# Patient Record
Sex: Male | Born: 1965 | Race: White | Hispanic: No | Marital: Married | Smoking: Former smoker
Health system: Southern US, Community
[De-identification: ages and names within clinical notes are randomized; demographics above are authoritative.]

## PROBLEM LIST (undated history)

## (undated) DIAGNOSIS — R519 Headache, unspecified: Secondary | ICD-10-CM

## (undated) DIAGNOSIS — F419 Anxiety disorder, unspecified: Secondary | ICD-10-CM

## (undated) DIAGNOSIS — D126 Benign neoplasm of colon, unspecified: Secondary | ICD-10-CM

## (undated) DIAGNOSIS — R51 Headache: Secondary | ICD-10-CM

## (undated) DIAGNOSIS — J189 Pneumonia, unspecified organism: Secondary | ICD-10-CM

## (undated) DIAGNOSIS — C3492 Malignant neoplasm of unspecified part of left bronchus or lung: Secondary | ICD-10-CM

## (undated) DIAGNOSIS — K219 Gastro-esophageal reflux disease without esophagitis: Secondary | ICD-10-CM

## (undated) DIAGNOSIS — I10 Essential (primary) hypertension: Secondary | ICD-10-CM

## (undated) DIAGNOSIS — J449 Chronic obstructive pulmonary disease, unspecified: Secondary | ICD-10-CM

## (undated) DIAGNOSIS — F4321 Adjustment disorder with depressed mood: Secondary | ICD-10-CM

## (undated) HISTORY — DX: Adjustment disorder with depressed mood: F43.21

## (undated) HISTORY — DX: Gastro-esophageal reflux disease without esophagitis: K21.9

## (undated) HISTORY — DX: Chronic obstructive pulmonary disease, unspecified: J44.9

## (undated) HISTORY — DX: Malignant neoplasm of unspecified part of left bronchus or lung: C34.92

## (undated) HISTORY — PX: KNEE ARTHROSCOPY: SUR90

## (undated) HISTORY — PX: COLONOSCOPY: SHX174

---

## 2002-04-26 ENCOUNTER — Encounter: Admission: RE | Admit: 2002-04-26 | Discharge: 2002-04-26 | Payer: Self-pay | Admitting: Internal Medicine

## 2002-04-26 ENCOUNTER — Encounter: Payer: Self-pay | Admitting: Internal Medicine

## 2003-05-05 ENCOUNTER — Encounter: Payer: Self-pay | Admitting: Orthopedic Surgery

## 2003-05-05 ENCOUNTER — Ambulatory Visit (HOSPITAL_COMMUNITY): Admission: RE | Admit: 2003-05-05 | Discharge: 2003-05-05 | Payer: Self-pay | Admitting: Orthopedic Surgery

## 2007-03-26 ENCOUNTER — Ambulatory Visit: Payer: Self-pay | Admitting: Internal Medicine

## 2007-03-29 LAB — CONVERTED CEMR LAB
ALT: 24 units/L (ref 0–40)
AST: 22 units/L (ref 0–37)
Albumin: 4.1 g/dL (ref 3.5–5.2)
Alkaline Phosphatase: 67 units/L (ref 39–117)
BUN: 7 mg/dL (ref 6–23)
Basophils Absolute: 0 10*3/uL (ref 0.0–0.1)
Basophils Relative: 0 % (ref 0.0–1.0)
Bilirubin Urine: NEGATIVE
Bilirubin, Direct: 0.2 mg/dL (ref 0.0–0.3)
CO2: 31 meq/L (ref 19–32)
Calcium: 9.2 mg/dL (ref 8.4–10.5)
Chloride: 105 meq/L (ref 96–112)
Cholesterol: 139 mg/dL (ref 0–200)
Creatinine, Ser: 0.9 mg/dL (ref 0.4–1.5)
Eosinophils Absolute: 0.1 10*3/uL (ref 0.0–0.6)
Eosinophils Relative: 1.2 % (ref 0.0–5.0)
GFR calc Af Amer: 119 mL/min
GFR calc non Af Amer: 98 mL/min
Glucose, Bld: 88 mg/dL (ref 70–99)
HCT: 47.1 % (ref 39.0–52.0)
HDL: 41.9 mg/dL (ref >39.0)
Hemoglobin, Urine: NEGATIVE
Hemoglobin: 16.3 g/dL (ref 13.0–17.0)
Ketones, ur: NEGATIVE mg/dL
LDL Cholesterol: 85 mg/dL (ref 0–99)
Leukocytes, UA: NEGATIVE
Lymphocytes Relative: 28.3 % (ref 12.0–46.0)
MCHC: 34.6 g/dL (ref 30.0–36.0)
MCV: 91.8 fL (ref 78.0–100.0)
Monocytes Absolute: 0.5 10*3/uL (ref 0.2–0.7)
Monocytes Relative: 7.9 % (ref 3.0–11.0)
Neutro Abs: 4.3 10*3/uL (ref 1.4–7.7)
Neutrophils Relative %: 62.6 % (ref 43.0–77.0)
Nitrite: NEGATIVE
PSA: 1.42 ng/mL (ref 0.10–4.00)
Platelets: 229 10*3/uL (ref 150–400)
Potassium: 5.8 meq/L — ABNORMAL HIGH (ref 3.5–5.1)
RBC: 5.13 M/uL (ref 4.22–5.81)
RDW: 11.5 % (ref 11.5–14.6)
Sodium: 141 meq/L (ref 135–145)
Specific Gravity, Urine: <=1.005 (ref 1.000–1.03)
TSH: 1.61 microintl units/mL (ref 0.35–5.50)
Total Bilirubin: 0.6 mg/dL (ref 0.3–1.2)
Total CHOL/HDL Ratio: 3.3
Total Protein, Urine: NEGATIVE mg/dL
Total Protein: 6.8 g/dL (ref 6.0–8.3)
Triglycerides: 61 mg/dL (ref 0–149)
Urine Glucose: NEGATIVE mg/dL
Urobilinogen, UA: 0.2 (ref 0.0–1.0)
VLDL: 12 mg/dL (ref 0–40)
WBC: 6.9 10*3/uL (ref 4.5–10.5)
pH: 6 (ref 5.0–8.0)

## 2010-07-10 ENCOUNTER — Ambulatory Visit: Payer: Self-pay | Admitting: Internal Medicine

## 2010-07-10 DIAGNOSIS — R059 Cough, unspecified: Secondary | ICD-10-CM | POA: Insufficient documentation

## 2010-07-10 DIAGNOSIS — K219 Gastro-esophageal reflux disease without esophagitis: Secondary | ICD-10-CM | POA: Insufficient documentation

## 2010-07-10 DIAGNOSIS — F17200 Nicotine dependence, unspecified, uncomplicated: Secondary | ICD-10-CM

## 2010-07-10 DIAGNOSIS — R05 Cough: Secondary | ICD-10-CM

## 2010-07-10 DIAGNOSIS — J449 Chronic obstructive pulmonary disease, unspecified: Secondary | ICD-10-CM

## 2010-08-19 ENCOUNTER — Ambulatory Visit: Payer: Self-pay | Admitting: Internal Medicine

## 2010-08-19 LAB — CONVERTED CEMR LAB
ALT: 23 units/L (ref 0–53)
AST: 24 units/L (ref 0–37)
Albumin: 4.1 g/dL (ref 3.5–5.2)
Alkaline Phosphatase: 72 units/L (ref 39–117)
BUN: 9 mg/dL (ref 6–23)
Basophils Absolute: 0 10*3/uL (ref 0.0–0.1)
Basophils Relative: 0.3 % (ref 0.0–3.0)
Bilirubin Urine: NEGATIVE
Bilirubin, Direct: 0.1 mg/dL (ref 0.0–0.3)
CO2: 31 meq/L (ref 19–32)
Calcium: 9.8 mg/dL (ref 8.4–10.5)
Chloride: 106 meq/L (ref 96–112)
Cholesterol: 174 mg/dL (ref 0–200)
Creatinine, Ser: 0.8 mg/dL (ref 0.4–1.5)
Direct LDL: 91.6 mg/dL
Eosinophils Absolute: 0 10*3/uL (ref 0.0–0.7)
Eosinophils Relative: 0.7 % (ref 0.0–5.0)
GFR calc non Af Amer: 107.56 mL/min (ref >60)
Glucose, Bld: 103 mg/dL — ABNORMAL HIGH (ref 70–99)
H Pylori IgG: NEGATIVE
HCT: 48.3 % (ref 39.0–52.0)
HDL: 43.6 mg/dL (ref >39.00)
Hemoglobin, Urine: NEGATIVE
Hemoglobin: 17 g/dL (ref 13.0–17.0)
Ketones, ur: NEGATIVE mg/dL
Leukocytes, UA: NEGATIVE
Lymphocytes Relative: 22.3 % (ref 12.0–46.0)
Lymphs Abs: 1.5 10*3/uL (ref 0.7–4.0)
MCHC: 35.2 g/dL (ref 30.0–36.0)
MCV: 96.2 fL (ref 78.0–100.0)
Monocytes Absolute: 0.5 10*3/uL (ref 0.1–1.0)
Monocytes Relative: 8.2 % (ref 3.0–12.0)
Neutro Abs: 4.5 10*3/uL (ref 1.4–7.7)
Neutrophils Relative %: 68.5 % (ref 43.0–77.0)
Nitrite: NEGATIVE
Platelets: 185 10*3/uL (ref 150.0–400.0)
Potassium: 5.8 meq/L — ABNORMAL HIGH (ref 3.5–5.1)
RBC: 5.02 M/uL (ref 4.22–5.81)
RDW: 12.5 % (ref 11.5–14.6)
Sodium: 144 meq/L (ref 135–145)
Specific Gravity, Urine: 1.02 (ref 1.000–1.030)
TSH: 2.32 microintl units/mL (ref 0.35–5.50)
Total Bilirubin: 0.7 mg/dL (ref 0.3–1.2)
Total CHOL/HDL Ratio: 4
Total Protein, Urine: NEGATIVE mg/dL
Total Protein: 6.9 g/dL (ref 6.0–8.3)
Triglycerides: 249 mg/dL — ABNORMAL HIGH (ref 0.0–149.0)
Urine Glucose: NEGATIVE mg/dL
Urobilinogen, UA: 0.2 (ref 0.0–1.0)
VLDL: 49.8 mg/dL — ABNORMAL HIGH (ref 0.0–40.0)
WBC: 6.6 10*3/uL (ref 4.5–10.5)
pH: 6.5 (ref 5.0–8.0)

## 2010-08-22 ENCOUNTER — Telehealth: Payer: Self-pay | Admitting: Internal Medicine

## 2010-08-23 ENCOUNTER — Encounter: Payer: Self-pay | Admitting: Internal Medicine

## 2010-08-23 ENCOUNTER — Ambulatory Visit: Payer: Self-pay | Admitting: Internal Medicine

## 2010-08-23 DIAGNOSIS — R1310 Dysphagia, unspecified: Secondary | ICD-10-CM | POA: Insufficient documentation

## 2010-10-14 ENCOUNTER — Ambulatory Visit: Payer: Self-pay | Admitting: Internal Medicine

## 2010-10-14 ENCOUNTER — Encounter (INDEPENDENT_AMBULATORY_CARE_PROVIDER_SITE_OTHER): Payer: Self-pay | Admitting: *Deleted

## 2010-11-05 ENCOUNTER — Ambulatory Visit: Payer: Self-pay | Admitting: Internal Medicine

## 2010-12-10 NOTE — Assessment & Plan Note (Signed)
Summary: DYSPHAGIA/YF   History of Present Illness Visit Type: Initial Consult Primary GI MD: Stan Head MD [facility] Primary Provider: Jacinta Shoe, MD Requesting Provider: Jacinta Shoe, MD Chief Complaint: dysphagia History of Present Illness:   Patient complains of feeling like something stuck in his throat for about six month. He states that he doesnot have any trouble swallowing just this feeling that wont go away.   6 months of globus sensation most of the time. Food and liquids go down fine usually but he has this tightness and foreign body sensation. On Nexium x 3 months without any change and no difference. No classic heartburn described before or after Nexium.  Recent bronchitis and voice is nasal. He has sinus problems and post nasal drip for at least a few months. Some of these  are seasonal.    GI Review of Systems    Reports acid reflux.      Denies abdominal pain, belching, bloating, chest pain, dysphagia with liquids, dysphagia with solids, heartburn, loss of appetite, nausea, vomiting, vomiting blood, weight loss, and  weight gain.        Denies anal fissure, black tarry stools, change in bowel habit, constipation, diarrhea, diverticulosis, fecal incontinence, heme positive stool, hemorrhoids, irritable bowel syndrome, jaundice, light color stool, liver problems, rectal bleeding, and  rectal pain. Preventive Screening-Counseling & Management  Alcohol-Tobacco     Smoking Status: current     Smoking Cessation Counseling: yes     Smoke Cessation Stage: contemplative  Comments: has reduced smoking and encouraged to quit.      Drug Use:  no.      Current Medications (verified): 1)  Vitamin D (Ergocalciferol) 50000 Unit Caps (Ergocalciferol) .Marland Kitchen.. 1 By Mouth Q 1 Week X 6 Weeks Then Start Vit D 1000 International Units Qd 2)  Nexium 40 Mg Cpdr (Esomeprazole Magnesium) .Marland Kitchen.. 1 By Mouth Qam ( Medically Necessary) 3)  Dulera 200-5 Mcg/act Aero (Mometasone  Furo-Formoterol Fum) .... 2 Inh Bid  Allergies (verified): No Known Drug Allergies  Past History:  Past Medical History: Reviewed history from 08/23/2010 and no changes required. COPD GERD  Past Surgical History: Reviewed history from 07/10/2010 and no changes required. L knee arthrosc surgery  Family History: skin ca: father breast ca: mother HTN: father No FH of Colon Cancer: Family History of Colon Polyps: father  Social History: Occupation:printing Single Current Smoker 1 ppd Alcohol use-yes social drinker Daily Caffeine Use 3 per day Illicit Drug Use - no Drug Use:  no  Review of Systems       The patient complains of cough and shortness of breath.         All other ROS negative except as per HPI.    Vital Signs:  Patient profile:   45 year old male Height:      71 inches Weight:      187.6 pounds BMI:     26.26 Pulse rate:   88 / minute Pulse rhythm:   regular BP sitting:   128 / 80  (left arm) Cuff size:   regular  Vitals Entered By: Harlow Mares CMA Duncan Dull) (October 14, 2010 10:12 AM)  Physical Exam  General:  Well developed, well nourished, no acute distress. Eyes:  PERRLA, no icterus. Mouth:  mild posterior pharynx erythema otherwise clear mouth, pharynx Neck:  Supple; no masses or thyromegaly. Lungs:  fair air movement wth inspiratory and expiratory wheezes Heart:  Regular rate and rhythm; no murmurs, rubs,  or bruits. Abdomen:  Soft, nontender and nondistended. No masses, hepatosplenomegaly or hernias noted. Normal bowel sounds. Extremities:  no edema Cervical Nodes:  No significant cervical or supraclavicular adenopathy.  Psych:  Alert and cooperative. Normal mood and affect.   Impression & Recommendations:  Problem # 1:  DYSPHAGIA UNSPECIFIED (ICD-787.20) Assessment Comment Only  NEW to ME mostly a globus problem but quite bothersome and unresponsive to Nexium 40 mg once daily since August. Possible causes seem like GERD (? if  so or needs two times a day PPI), sinus drainage, anxiety (does not seem so) with smoking hx if negative consider ENT evaluation and/or neck imaging EGD and possble dilation - Risks, benefits,and indications of endoscopic procedure(s) were reviewed with the patient and all questions answered.  Orders: EGD SAV (EGD SAV)  Patient Instructions: 1)  Please continue current medications.  2)  We will see you at your procedure on 11/05/10. 3)  [facility] Patient Information Guide given to patient.  4)  Upper Endoscopy with Dilatation brochure given.  5)  Copy sent to : Sonda Primes, MD 6)  The medication list was reviewed and reconciled.  All changed / newly prescribed medications were explained.  A complete medication list was provided to the patient / caregiver.

## 2010-12-10 NOTE — Progress Notes (Signed)
Summary: Vit D ?  Phone Note From Pharmacy   Caller: [facility] [address] [place] Summary of Call: rec rf request for Vit D 50,000IU.  Sig says take 1 by mouth weekly X 6 wks then start Vit D 1000IU by mouth once daily.  Ok to RF 50,000IU again??? Initial call taken by: Lanier Prude, [facility]),  August 22, 2010 1:36 PM  Follow-up for Phone Call        If he took the first course - no need to repeat 50000, just take 1000 u Vit D daily Follow-up by: Tresa Garter MD,  August 22, 2010 3:55 PM  Additional Follow-up for Phone Call Additional follow up Details #1::        spoke to Pt, he only received 4 wks of 50,000IU Vit D because insurance would not cover 6 weeks. Will call in 2 weeks of 50,000IU Vit D and once he has completed, to begin taking the 1000IU Vit D. Additional Follow-up by: Alysia Penna,  August 22, 2010 5:07 PM    Prescriptions: VITAMIN D (ERGOCALCIFEROL) 50000 UNIT CAPS (ERGOCALCIFEROL) 1 by mouth q 1 week x 6 weeks then start Vit D 1000 international units qd  #2 x 0   Entered by:   Alysia Penna   Authorized by:   Tresa Garter MD   Signed by:   Alysia Penna on 08/22/2010   Method used:   Electronically to        [facility] (retail)       [address]       [place], [place]  [address]       Ph: [phone]       Fax: [phone]   RxID:   [id number]

## 2010-12-10 NOTE — Assessment & Plan Note (Signed)
Summary: 6 WK ROV /NWS  #   Vital Signs:  Patient profile:   45 year old male Height:      71 inches Weight:      186 pounds BMI:     26.04 Temp:     97.7 degrees F oral Pulse rate:   84 / minute Pulse rhythm:   regular Resp:     16 per minute BP sitting:   124 / 78  (left arm) Cuff size:   regular  Vitals Entered By: Lanier Prude, CMA(AAMA) (August 23, 2010 7:52 AM) CC: 6 wk f/u Is Patient Diabetic? No   CC:  6 wk f/u.  History of Present Illness: C/o dysphagia and cough. He is much better on Dulera and Nexium, however still has some difficulty swallowing.  Preventive Screening-Counseling & Management  Alcohol-Tobacco     Smoking Status: current     Packs/Day: 1.0  Current Medications (verified): 1)  Vitamin D (Ergocalciferol) 50000 Unit Caps (Ergocalciferol) .Marland Kitchen.. 1 By Mouth Q 1 Week X 6 Weeks Then Start Vit D 1000 International Units Qd 2)  Nexium 40 Mg Cpdr (Esomeprazole Magnesium) .Marland Kitchen.. 1 By Mouth Qam ( Medically Necessary) 3)  Dulera 200-5 Mcg/act Aero (Mometasone Furo-Formoterol Fum) .... 2 Inh Bid  Allergies (verified): No Known Drug Allergies  Past History:  Social History: Last updated: 08/23/2010 Occupation:printing Single Current Smoker 1 ppd Alcohol use-yes  Past Medical History: COPD GERD  Social History: Occupation:printing Single Current Smoker 1 ppd Alcohol use-yes Packs/Day:  1.0  Review of Systems  The patient denies fever, weight loss, weight gain, chest pain, and dyspnea on exertion.    Physical Exam  General:  Well-developed,well-nourished,in no acute distress; alert,appropriate and cooperative throughout examination Nose:  External nasal examination shows no deformity or inflammation. Nasal mucosa are pink and moist without lesions or exudates. Mouth:  Oral mucosa and oropharynx without lesions or exudates.  Neck:  No deformities, masses, or tenderness noted. Lungs:  Normal respiratory effort, chest expands symmetrically.  Lungs are clear to auscultation, mild B wheezes. Heart:  Normal rate and regular rhythm. S1 and S2 normal without gallop, murmur, click, rub or other extra sounds. Abdomen:  Bowel sounds positive,abdomen soft and non-tender without masses, organomegaly or hernias noted. Msk:  No deformity or scoliosis noted of thoracic or lumbar spine.   Extremities:  No clubbing, cyanosis, edema, or deformity noted with normal full range of motion of all joints.   Neurologic:  No cranial nerve deficits noted. Station and gait are normal. Plantar reflexes are down-going bilaterally. DTRs are symmetrical throughout. Sensory, motor and coordinative functions appear intact. Skin:  Intact without suspicious lesions or rashes Psych:  Cognition and judgment appear intact. Alert and cooperative with normal attention span and concentration. No apparent delusions, illusions, hallucinations   Impression & Recommendations:  Problem # 1:  GERD (ICD-530.81) Assessment Improved  His updated medication list for this problem includes:    Nexium 40 Mg Cpdr (Esomeprazole magnesium) .Marland Kitchen... 1 by mouth qam ( medically necessary)  Orders: Gastroenterology Referral (GI)  Problem # 2:  COPD (ICD-496) Assessment: Improved  His updated medication list for this problem includes:    Dulera 200-5 Mcg/act Aero (Mometasone furo-formoterol fum) .Marland Kitchen... 2 inh bid  Orders: Spirometry w/Graph ([address])  Problem # 3:  COUGH (ICD-786.2) Assessment: Improved  Orders: Spirometry w/Graph ([address])  Problem # 4:  DYSPHAGIA UNSPECIFIED (ICD-787.20) Assessment: Improved  GI consult  Orders: Gastroenterology Referral (GI)  Problem # 5:  TOBACCO USER (ICD-305.1) Assessment:  Improved Down to 1 ppd  Complete Medication List: 1)  Vitamin D (ergocalciferol) 50000 Unit Caps (Ergocalciferol) .Marland Kitchen.. 1 by mouth q 1 week x 6 weeks then start vit d 1000 international units qd 2)  Nexium 40 Mg Cpdr (Esomeprazole magnesium) .Marland Kitchen.. 1 by mouth qam (  medically necessary) 3)  Dulera 200-5 Mcg/act Aero (Mometasone furo-formoterol fum) .... 2 inh bid  Patient Instructions: 1)  Please schedule a follow-up appointment in 4 months.

## 2010-12-10 NOTE — Assessment & Plan Note (Signed)
Summary: PER CINDY/CARDIO VASCULAR US-CHOKE WHEN EAT-LAST APPT:2008-[facility]   Vital Signs:  Patient profile:   45 year old male Height:      71 inches Weight:      185 pounds BMI:     25.90 Temp:     98.5 degrees F oral Pulse rate:   84 / minute Pulse rhythm:   regular Resp:     16 per minute BP sitting:   128 / 80  (left arm) Cuff size:   regular  Vitals Entered By: Lanier Prude, Beverly Gust) (July 10, 2010 2:04 PM) CC: choking sensation Is Patient Diabetic? No   CC:  choking sensation.  History of Present Illness: New pt -- C/o chocking sensation x 5-6 months. He states it feels funny in the throat; coughing spells, 0-2 attacks a day. No CP, SOB.  No N/V.  Preventive Screening-Counseling & Management  Alcohol-Tobacco     Smoking Status: current  Current Medications (verified): 1)  None  Allergies (verified): No Known Drug Allergies  Past History:  Past Medical History: COPD  Past Surgical History: L knee arthrosc surgery  Family History: skin ca breast ca HTN  Social History: Occupation:printing Single Current Smoker Alcohol use-yes  Review of Systems  The patient denies anorexia, fever, weight loss, weight gain, hoarseness, chest pain, dyspnea on exertion, prolonged cough, abdominal pain, melena, hematochezia, severe indigestion/heartburn, muscle weakness, depression, and enlarged lymph nodes.    Physical Exam  General:  Well-developed,well-nourished,in no acute distress; alert,appropriate and cooperative throughout examination Head:  Normocephalic and atraumatic without obvious abnormalities. No apparent alopecia or balding. Eyes:  No corneal or conjunctival inflammation noted. EOMI. Perrla. Ears:  External ear exam shows no significant lesions or deformities.  Otoscopic examination reveals clear canals, tympanic membranes are intact bilaterally without bulging, retraction, inflammation or discharge. Hearing is grossly normal bilaterally. Nose:   External nasal examination shows no deformity or inflammation. Nasal mucosa are pink and moist without lesions or exudates. Mouth:  Oral mucosa and oropharynx without lesions or exudates.  Neck:  No deformities, masses, or tenderness noted. Chest Wall:  No deformities, masses, tenderness or gynecomastia noted. Lungs:  Normal respiratory effort, chest expands symmetrically. Lungs are clear to auscultation, no crackles or wheezes. Heart:  Normal rate and regular rhythm. S1 and S2 normal without gallop, murmur, click, rub or other extra sounds. Abdomen:  Bowel sounds positive,abdomen soft and non-tender without masses, organomegaly or hernias noted. Msk:  No deformity or scoliosis noted of thoracic or lumbar spine.   Pulses:  R and L carotid,radial,femoral,dorsalis pedis and posterior tibial pulses are full and equal bilaterally Extremities:  No clubbing, cyanosis, edema, or deformity noted with normal full range of motion of all joints.   Neurologic:  No cranial nerve deficits noted. Station and gait are normal. Plantar reflexes are down-going bilaterally. DTRs are symmetrical throughout. Sensory, motor and coordinative functions appear intact. Skin:  Intact without suspicious lesions or rashes Cervical Nodes:  No lymphadenopathy noted Psych:  Cognition and judgment appear intact. Alert and cooperative with normal attention span and concentration. No apparent delusions, illusions, hallucinations   Impression & Recommendations:  Problem # 1:  COUGH (ICD-786.2) Assessment New  Orders: T-2 View CXR, Same Day (71020.5TC)  Problem # 2:  COPD (ICD-496) Assessment: New  His updated medication list for this problem includes:    Dulera 200-5 Mcg/act Aero (Mometasone furo-formoterol fum) .Marland Kitchen... 2 inh bid  Problem # 3:  GERD (ICD-530.81) Assessment: New He may need a GI consult/EGD His updated  medication list for this problem includes:    Nexium 40 Mg Cpdr (Esomeprazole magnesium) .Marland Kitchen... 1 by  mouth qam ( medically necessary)  Problem # 4:  TOBACCO USER (ICD-305.1) Assessment: Unchanged  Encouraged smoking cessation and discussed different methods for smoking cessation.   Complete Medication List: 1)  Vitamin D (ergocalciferol) 50000 Unit Caps (Ergocalciferol) .Marland Kitchen.. 1 by mouth q 1 week x 6 weeks then start vit d 1000 international units qd 2)  Nexium 40 Mg Cpdr (Esomeprazole magnesium) .Marland Kitchen.. 1 by mouth qam ( medically necessary) 3)  Dulera 200-5 Mcg/act Aero (Mometasone furo-formoterol fum) .... 2 inh bid 4)  Ceftin 500 Mg Tabs (Cefuroxime axetil) .Marland Kitchen.. 1 by mouth bid  Patient Instructions: 1)  Please schedule a follow-up appointment in 6 weeks. 2)  BMP prior to visit, ICD-9: 3)  Hepatic Panel prior to visit, ICD-9: 4)  Lipid Panel prior to visit, ICD-9: 5)  TSH prior to visit, ICD-9: 6)  CBC w/ Diff prior to visit, ICD-9: 7)  Urine-dip prior to visit, ICD-9: 8)  H pylori  530.81  v70.0 Prescriptions: CEFTIN 500 MG TABS (CEFUROXIME AXETIL) 1 by mouth bid  #28 x 1   Entered and Authorized by:   Tresa Garter MD   Signed by:   Tresa Garter MD on 07/10/2010   Method used:   Print then Give to Patient   RxID:   [id number] DULERA 200-5 MCG/ACT AERO (MOMETASONE FURO-FORMOTEROL FUM) 2 inh bid  #1 x 6   Entered and Authorized by:   Tresa Garter MD   Signed by:   Tresa Garter MD on 07/10/2010   Method used:   Print then Give to Patient   RxID:   [id number] NEXIUM 40 MG CPDR (ESOMEPRAZOLE MAGNESIUM) 1 by mouth qam ( medically necessary)  #30 x 12   Entered and Authorized by:   Tresa Garter MD   Signed by:   Tresa Garter MD on 07/10/2010   Method used:   Print then Give to Patient   RxID:   [id number] VITAMIN D (ERGOCALCIFEROL) 50000 UNIT CAPS (ERGOCALCIFEROL) 1 by mouth q 1 week x 6 weeks then start Vit D 1000 international units qd  #6 x 0   Entered and Authorized by:   Tresa Garter MD   Signed by:   Tresa Garter MD on 07/10/2010   Method used:   Print then Give to Patient   RxID:   [id number]

## 2010-12-10 NOTE — Letter (Signed)
Summary: New Patient letter  [facility]  [address] [place], [place] [address]   Phone: [phone]  Fax: [phone]       08/23/2010 MRN: [id number]  Trevor Zavala [address] [place], [place]  [address]  Dear Mr. Gora,  Welcome to the Gastroenterology Division at [facility].    You are scheduled to see Dr.  Leone Payor on 10/14/10 at 10am on the 3rd floor at [facility], [address]  We ask that you try to arrive at our office 15 minutes prior to your appointment time to allow for check-in.  We would like you to complete the enclosed self-administered evaluation form prior to your visit and bring it with you on the day of your appointment.  We will review it with you.  Also, please bring a complete list of all your medications or, if you prefer, bring the medication bottles and we will list them.  Please bring your insurance card so that we may make a copy of it.  If your insurance requires a referral to see a specialist, please bring your referral form from your primary care physician.  Co-payments are due at the time of your visit and may be paid by cash, check or credit card.     Your office visit will consist of a consult with your physician (includes a physical exam), any laboratory testing he/she may order, scheduling of any necessary diagnostic testing (e.g. x-ray, ultrasound, CT-scan), and scheduling of a procedure (e.g. Endoscopy, Colonoscopy) if required.  Please allow enough time on your schedule to allow for any/all of these possibilities.    If you cannot keep your appointment, please call [phone] to cancel or reschedule prior to your appointment date.  This allows [place] the opportunity to schedule an appointment for another patient in need of care.  If you do not cancel or reschedule by 5 p.m. the business day prior to your appointment date, you will be charged a $50.00 late cancellation/no-show fee.    Thank you for choosing  [facility] for your medical needs.  We appreciate the opportunity to care for you.  Please visit [place] at our website  to learn more about our practice.                     Sincerely,                                                             The Gastroenterology Division

## 2010-12-10 NOTE — Letter (Signed)
Summary: EGD Instructions  [facility]  [address] [place], [place] [address]   Phone: [phone]  Fax: [phone]       Trevor Zavala    [birth year]    MRN: [id number]       Procedure Day Dorna Bloom: [name], 11/05/10     Arrival Time: 2:30 PM     Procedure Time: 3:30 PM     Location of Procedure:                    _X_ [facility] (4th Floor)  PREPARATION FOR ENDOSCOPY   On TUESDAY, 11/05/10, THE DAY OF THE PROCEDURE:  1.   No solid foods, milk or milk products are allowed after midnight the night before your procedure.  2.   Do not drink anything colored red or purple.  Avoid juices with pulp.  No orange juice.  3.  You may drink clear liquids until 1:30 PM, which is 2 hours before your procedure.                                                                                                CLEAR LIQUIDS INCLUDE: Water Jello Ice Popsicles Tea (sugar ok, no milk/cream) Powdered fruit flavored drinks Coffee (sugar ok, no milk/cream) Gatorade Juice: apple, white grape, white cranberry  Lemonade Clear bullion, consomm, broth Carbonated beverages (any kind) Strained chicken noodle soup Hard Candy   MEDICATION INSTRUCTIONS  Unless otherwise instructed, you should take regular prescription medications with a small sip of water as early as possible the morning of your procedure.                    OTHER INSTRUCTIONS  You will need a responsible adult at least 47 years of age to accompany you and drive you home.   This person must remain in the waiting room during your procedure.  Wear loose fitting clothing that is easily removed.  Leave jewelry and other valuables at home.  However, you may wish to bring a book to read or an iPod/MP3 player to listen to music as you wait for your procedure to start.  Remove all body piercing jewelry and leave at home.  Total time from sign-in until discharge is approximately 2-3  hours.  You should go home directly after your procedure and rest.  You can resume normal activities the day after your procedure.  The day of your procedure you should not:   Drive   Make legal decisions   Operate machinery   Drink alcohol   Return to work  You will receive specific instructions about eating, activities and medications before you leave.    The above instructions have been reviewed and explained to me by   Francee Piccolo, CMA (AAMA)     I fully understand and can verbalize these instructions _____________________________ Date 10/14/10

## 2010-12-12 NOTE — Procedures (Signed)
Summary: Upper Endoscopy w/DIL  Patient: Sherrill Mckamie Note: All result statuses are Final unless otherwise noted.  Tests: (1) Upper Endoscopy w/DIL (UED)  UED Upper Endoscopy w/DIL                             DONE     [facility]     [address]     [place], [place]  [address]           ENDOSCOPY PROCEDURE REPORT           PATIENT:  Vamsi, Apfel  MR#:  [id number]     BIRTHDATE:  Jan 27, 1966, 44 yrs. old  GENDER:  male           ENDOSCOPIST:  Iva Boop, MD, [facility]           PROCEDURE DATE:  11/05/2010     PROCEDURE:  EGD, diagnostic, Maloney Dilation of the Esophagus     ASA CLASS:  Class II     INDICATIONS:  1) dysphagia           MEDICATIONS:   Fentanyl 50 mcg IV, Versed 7 mg     TOPICAL ANESTHETIC:  Exactacain Spray           DESCRIPTION OF PROCEDURE:   After the risks benefits and     alternatives of the procedure were thoroughly explained, informed     consent was obtained.  The [id number] endoscope was     introduced through the mouth and advanced to the second portion of     the duodenum, without limitations.  The instrument was slowly     withdrawn as the mucosa was carefully examined.     <<PROCEDUREIMAGES>>           The upper, middle, and distal third of the esophagus were     carefully inspected and no abnormalities were noted. The z-line     was well seen at the GEJ. The endoscope was pushed into the fundus     which was normal including a retroflexed view. The antrum, first     and second part of the duodenum were unremarkable.    Dilation was     then performed at the proximal esophagus           1) Dilator:  Elease Hashimoto  Size(s):  54 French     Resistance:  minimal  Heme:  none           COMPLICATIONS:  None           ENDOSCOPIC IMPRESSION:     1) Normal EGD - upper esophagus dilated due to symptoms     RECOMMENDATIONS:     1) Clear liquids until 5 PM then soft foods tonight.     2) Resume normal foods tomorrow.     3) Follow-up  GI as needed, if he has persistent problems with     neck tightness, globus issues he should see ENT for exam.           REPEAT EXAM:  In for as needed.           Iva Boop, MD, [facility]           CC:  Linda Hedges. [name], M.D.     The Patient           n.     eSIGNED:   Iva Boop at 11/05/2010  03:55 PM           Ismaeel, [place], [id number]  Note: An exclamation mark (!) indicates a result that was not dispersed into the flowsheet. Document Creation Date: 11/05/2010 3:55 PM _______________________________________________________________________  (1) Order result status: Final Collection or observation date-time: 11/05/2010 15:44 Requested date-time:  Receipt date-time:  Reported date-time:  Referring Physician:   Ordering Physician: Stan Head [phone]) Specimen Source:  Source: Launa Grill Order Number: [id number] Lab site:

## 2010-12-31 ENCOUNTER — Ambulatory Visit (INDEPENDENT_AMBULATORY_CARE_PROVIDER_SITE_OTHER): Payer: PRIVATE HEALTH INSURANCE | Admitting: Internal Medicine

## 2010-12-31 ENCOUNTER — Encounter: Payer: Self-pay | Admitting: Internal Medicine

## 2010-12-31 DIAGNOSIS — J449 Chronic obstructive pulmonary disease, unspecified: Secondary | ICD-10-CM

## 2010-12-31 DIAGNOSIS — H6530 Chronic mucoid otitis media, unspecified ear: Secondary | ICD-10-CM | POA: Insufficient documentation

## 2010-12-31 DIAGNOSIS — F17200 Nicotine dependence, unspecified, uncomplicated: Secondary | ICD-10-CM

## 2010-12-31 DIAGNOSIS — R1310 Dysphagia, unspecified: Secondary | ICD-10-CM

## 2010-12-31 DIAGNOSIS — K219 Gastro-esophageal reflux disease without esophagitis: Secondary | ICD-10-CM

## 2011-01-07 NOTE — Assessment & Plan Note (Signed)
Summary: 4 MO ROV / # /CD   Vital Signs:  Patient profile:   45 year old male Height:      71 inches Weight:      191 pounds BMI:     26.74 Temp:     98.5 degrees F oral Pulse rate:   68 / minute Pulse rhythm:   regular Resp:     16 per minute BP sitting:   130 / 84  (left arm) Cuff size:   regular  Vitals Entered By: Lanier Prude, CMA(AAMA) (December 31, 2010 8:03 AM) CC: 4 mo f/u Is Patient Diabetic? No   Primary Care Provider:  Jacinta Shoe, MD  CC:  4 mo f/u.  History of Present Illness: F/u GERD and globus sensation - better  F/u COPD  Current Medications (verified): 1)  Vitamin D (Ergocalciferol) 50000 Unit Caps (Ergocalciferol) .Marland Kitchen.. 1 By Mouth Q 1 Week X 6 Weeks Then Start Vit D 1000 International Units Qd 2)  Nexium 40 Mg Cpdr (Esomeprazole Magnesium) .Marland Kitchen.. 1 By Mouth Qam ( Medically Necessary) 3)  Dulera 200-5 Mcg/act Aero (Mometasone Furo-Formoterol Fum) .... 2 Inh Bid  Allergies (verified): No Known Drug Allergies  Past History:  Past Medical History: Last updated: 08/23/2010 COPD GERD  Social History: Last updated: 10/14/2010 Occupation:printing Single Current Smoker 1 ppd Alcohol use-yes social drinker Daily Caffeine Use 3 per day Illicit Drug Use - no  Physical Exam  Ears:  L TM is dull and retracted - scar is present; congested R TM is ok Nose:  mucosal erythema.   Mouth:  Oral mucosa and oropharynx without lesions or exudates.  Neck:  No deformities, masses, or tenderness noted. Lungs:  Normal respiratory effort, chest expands symmetrically. Lungs are clear to auscultation, mild B wheezes. Heart:  Normal rate and regular rhythm. S1 and S2 normal without gallop, murmur, click, rub or other extra sounds. Abdomen:  Bowel sounds positive,abdomen soft and non-tender without masses, organomegaly or hernias noted. Msk:  No deformity or scoliosis noted of thoracic or lumbar spine.   Pulses:  R and L carotid,radial,femoral,dorsalis pedis  and posterior tibial pulses are full and equal bilaterally Neurologic:  No cranial nerve deficits noted. Station and gait are normal. Plantar reflexes are down-going bilaterally. DTRs are symmetrical throughout. Sensory, motor and coordinative functions appear intact.   Impression & Recommendations:  Problem # 1:  GERD (ICD-530.81) Assessment Improved  His updated medication list for this problem includes:    Nexium 40 Mg Cpdr (Esomeprazole magnesium) .Marland Kitchen... 1 by mouth qam ( medically necessary) ENDOSCOPIC IMPRESSION:     1) Normal EGD - upper esophagus dilated due to symptoms     RECOMMENDATIONS:     1) Clear liquids until 5 PM then soft foods tonight.     2) Resume normal foods tomorrow.     3) Follow-up GI as needed, if he has persistent problems with     neck tightness, globus issues he should see ENT for exam.           REPEAT EXAM:  In for as needed.           Iva Boop, MD, [facility]  Problem # 2:  COPD (ICD-496) Assessment: Improved  His updated medication list for this problem includes:    Dulera 200-5 Mcg/act Aero (Mometasone furo-formoterol fum) .Marland Kitchen... 2 inh bid  Problem # 3:  TOBACCO USER (ICD-305.1) Assessment: Improved  Encouraged smoking cessation and discussed different methods for smoking cessation.   Problem # 4:  OTITIS MEDIA, MUCOID, CHRONIC (ICD-381.20) L Assessment: Deteriorated  Orders: ENT Referral (ENT)  Complete Medication List: 1)  Vitamin D (ergocalciferol) 50000 Unit Caps (Ergocalciferol) .Marland Kitchen.. 1 by mouth q 1 week x 6 weeks then start vit d 1000 international units qd 2)  Nexium 40 Mg Cpdr (Esomeprazole magnesium) .Marland Kitchen.. 1 by mouth qam ( medically necessary) 3)  Dulera 200-5 Mcg/act Aero (Mometasone furo-formoterol fum) .... 2 inh bid  Patient Instructions: 1)  Please schedule a follow-up appointment in 6 months. Prescriptions: DULERA 200-5 MCG/ACT AERO (MOMETASONE FURO-FORMOTEROL FUM) 2 inh bid  #1 x 6   Entered and Authorized by:   Tresa Garter MD   Signed by:   Tresa Garter MD on 12/31/2010   Method used:   Print then Give to Patient   RxID:   [id number] NEXIUM 40 MG CPDR (ESOMEPRAZOLE MAGNESIUM) 1 by mouth qam ( medically necessary)  #30 x 12   Entered and Authorized by:   Tresa Garter MD   Signed by:   Tresa Garter MD on 12/31/2010   Method used:   Print then Give to Patient   RxID:   [id number]    Orders Added: 1)  Est. Patient Level IV [56387] 2)  ENT Referral [ENT]

## 2011-01-14 ENCOUNTER — Encounter: Payer: Self-pay | Admitting: Internal Medicine

## 2011-01-28 NOTE — Consult Note (Signed)
Summary: [facility]  [facility]   Imported By: Sherian Rein 01/22/2011 08:50:14  _____________________________________________________________________  External Attachment:    Type:   Image     Comment:   External Document

## 2011-03-25 NOTE — Assessment & Plan Note (Signed)
[facility] [place]                           PRIMARY CARE OFFICE NOTE   NAME:Trevor Zavala, Trevor Zavala                       MRN:          [id number]  DATE:03/26/2007                            DOB:          Oct 20, 1966    The patient is a 45 year old male who presents for a wellness  examination.   ALLERGIES:  None.   PAST MEDICAL HISTORY/FAMILY HISTORY/SOCIAL HISTORY:  As per April 12, 2002  note.  He continues to smoke 1 pack a day.   CURRENT MEDICATIONS:  None.   REVIEW OF SYSTEMS:  No chest pain or shortness of breath.  Occasional  wheezing.  Occasional cough.  No syncopal spells.  No GI problems  including blood in the stool.  The rest of the 18-point review of  systems is negative.   PHYSICAL EXAMINATION:  Blood pressure is 142/94, pulse 73, temperature  97.8.  Weight 181 pounds.  He looks well.  He is in no acute distress.  HEENT:  Moist mucosa.  NECK:  Supple.  No thyromegaly or bruit.  LUNGS:  Bilateral mild rhonchi.  HEART:  S1 and S2.  No gallop.  No murmur.  ABDOMEN:  Soft and nontender.  No organomegaly or mass felt.  LOWER EXTREMITIES:  Without edema.  He is alert, oriented and cooperative, denies being depressed.  Normal external genitalia.  Testicles without masses.  RECTAL:  Not done.   LABORATORY DATA:  EKG with normal sinus rhythm.   ASSESSMENT AND PLAN:  1. Normal wellness examination, age/health-related issues discussed,      healthy lifestyle discussed.  He understands that he needs to stop      smoking.  Baby aspirin one daily, vitamin D 1000 units daily.      Obtain chest x-ray and laboratory work.  2. Tobacco smoking options discussed, information provided.  Obtain      chest x-ray.  3. Wheezing/rhonchi, likely due to chronic obstructive pulmonary      disease.  Obtain chest x-ray.  Advised to smoking as soon as      possible.  Declined pulmonary function tests.     Georgina Quint. [name], MD  Electronically Signed   AVP/MedQ   DD: 03/30/2007  DT: 03/31/2007  Job #: [id number]

## 2011-04-05 ENCOUNTER — Other Ambulatory Visit: Payer: Self-pay | Admitting: Internal Medicine

## 2011-07-01 ENCOUNTER — Ambulatory Visit: Payer: PRIVATE HEALTH INSURANCE | Admitting: Internal Medicine

## 2011-08-02 ENCOUNTER — Other Ambulatory Visit: Payer: Self-pay | Admitting: Internal Medicine

## 2011-08-04 ENCOUNTER — Other Ambulatory Visit: Payer: Self-pay | Admitting: *Deleted

## 2011-08-04 MED ORDER — ESOMEPRAZOLE MAGNESIUM 40 MG PO CPDR: 40.0000 mg | DELAYED_RELEASE_CAPSULE | Freq: Every day | ORAL | Status: DC

## 2011-08-14 ENCOUNTER — Encounter: Payer: Self-pay | Admitting: Internal Medicine

## 2011-08-22 ENCOUNTER — Encounter: Payer: Self-pay | Admitting: Internal Medicine

## 2011-08-22 ENCOUNTER — Ambulatory Visit (INDEPENDENT_AMBULATORY_CARE_PROVIDER_SITE_OTHER): Payer: PRIVATE HEALTH INSURANCE | Admitting: Internal Medicine

## 2011-08-22 DIAGNOSIS — F17200 Nicotine dependence, unspecified, uncomplicated: Secondary | ICD-10-CM

## 2011-08-22 DIAGNOSIS — J449 Chronic obstructive pulmonary disease, unspecified: Secondary | ICD-10-CM

## 2011-08-22 DIAGNOSIS — J4489 Other specified chronic obstructive pulmonary disease: Secondary | ICD-10-CM

## 2011-08-22 DIAGNOSIS — K219 Gastro-esophageal reflux disease without esophagitis: Secondary | ICD-10-CM

## 2011-08-22 DIAGNOSIS — J310 Chronic rhinitis: Secondary | ICD-10-CM

## 2011-08-22 MED ORDER — AZELASTINE-FLUTICASONE 137-50 MCG/ACT NA SUSP: 1.0000 | NASAL | Status: DC

## 2011-08-22 NOTE — Progress Notes (Signed)
  Subjective:    Patient ID: Trevor Zavala, male    DOB: April 22, 1966, 46 y.o.   MRN: [id number]  HPI  The patient presents for a follow-up of  chronic GERD, COPD, controlled with medicines C/o post-nasal drip    Review of Systems  Constitutional: Negative for appetite change, fatigue and unexpected weight change.  HENT: Positive for congestion and rhinorrhea. Negative for nosebleeds, sore throat, sneezing, trouble swallowing and neck pain.   Eyes: Negative for itching and visual disturbance.  Respiratory: Negative for cough.   Cardiovascular: Negative for chest pain, palpitations and leg swelling.  Gastrointestinal: Negative for nausea, diarrhea, blood in stool and abdominal distention.  Genitourinary: Negative for frequency and hematuria.  Musculoskeletal: Negative for back pain, joint swelling and gait problem.  Skin: Negative for rash.  Neurological: Negative for dizziness, tremors, speech difficulty and weakness.  Psychiatric/Behavioral: Negative for sleep disturbance, dysphoric mood and agitation. The patient is not nervous/anxious.        Objective:   Physical Exam  Constitutional: He is oriented to person, place, and time. He appears well-developed.  HENT:  Mouth/Throat: Oropharynx is clear and moist.  Eyes: Conjunctivae are normal. Pupils are equal, round, and reactive to light.  Neck: Normal range of motion. No JVD present. No thyromegaly present.  Cardiovascular: Normal rate, regular rhythm, normal heart sounds and intact distal pulses.  Exam reveals no gallop and no friction rub.   No murmur heard. Pulmonary/Chest: Effort normal and breath sounds normal. No respiratory distress. He has no wheezes. He has no rales. He exhibits no tenderness.  Abdominal: Soft. Bowel sounds are normal. He exhibits no distension and no mass. There is no tenderness. There is no rebound and no guarding.  Musculoskeletal: Normal range of motion. He exhibits no edema and no tenderness.    Lymphadenopathy:    He has no cervical adenopathy.  Neurological: He is alert and oriented to person, place, and time. He has normal reflexes. No cranial nerve deficit. He exhibits normal muscle tone. Coordination normal.  Skin: Skin is warm and dry. No rash noted.  Psychiatric: He has a normal mood and affect. His behavior is normal. Judgment and thought content normal.          Assessment & Plan:

## 2011-08-22 NOTE — Assessment & Plan Note (Signed)
Cont w/nexium

## 2011-08-22 NOTE — Assessment & Plan Note (Signed)
Start Flonase.

## 2011-08-22 NOTE — Assessment & Plan Note (Signed)
Continue with current prescription therapy as reflected on the Med list.  

## 2011-08-22 NOTE — Assessment & Plan Note (Signed)
Discussed.

## 2011-11-01 ENCOUNTER — Other Ambulatory Visit: Payer: Self-pay | Admitting: Internal Medicine

## 2011-12-24 ENCOUNTER — Other Ambulatory Visit: Payer: Self-pay | Admitting: *Deleted

## 2011-12-24 MED ORDER — ESOMEPRAZOLE MAGNESIUM 40 MG PO CPDR: 40.0000 mg | DELAYED_RELEASE_CAPSULE | Freq: Every day | ORAL | Status: DC

## 2012-02-19 ENCOUNTER — Other Ambulatory Visit: Payer: Self-pay | Admitting: Internal Medicine

## 2012-02-27 ENCOUNTER — Ambulatory Visit: Payer: PRIVATE HEALTH INSURANCE | Admitting: Internal Medicine

## 2012-03-19 ENCOUNTER — Encounter: Payer: Self-pay | Admitting: Internal Medicine

## 2012-03-19 ENCOUNTER — Ambulatory Visit (INDEPENDENT_AMBULATORY_CARE_PROVIDER_SITE_OTHER): Payer: PRIVATE HEALTH INSURANCE | Admitting: Internal Medicine

## 2012-03-19 VITALS — BP 150/100 | HR 84 | Temp 98.3°F | Resp 16 | Wt 189.0 lb

## 2012-03-19 DIAGNOSIS — L723 Sebaceous cyst: Secondary | ICD-10-CM

## 2012-03-19 DIAGNOSIS — J449 Chronic obstructive pulmonary disease, unspecified: Secondary | ICD-10-CM

## 2012-03-19 DIAGNOSIS — J310 Chronic rhinitis: Secondary | ICD-10-CM

## 2012-03-19 DIAGNOSIS — B001 Herpesviral vesicular dermatitis: Secondary | ICD-10-CM

## 2012-03-19 DIAGNOSIS — B009 Herpesviral infection, unspecified: Secondary | ICD-10-CM

## 2012-03-19 DIAGNOSIS — M720 Palmar fascial fibromatosis [Dupuytren]: Secondary | ICD-10-CM

## 2012-03-19 DIAGNOSIS — D170 Benign lipomatous neoplasm of skin and subcutaneous tissue of head, face and neck: Secondary | ICD-10-CM

## 2012-03-19 DIAGNOSIS — F17200 Nicotine dependence, unspecified, uncomplicated: Secondary | ICD-10-CM

## 2012-03-19 MED ORDER — AZELASTINE-FLUTICASONE 137-50 MCG/ACT NA SUSP: 1.0000 | NASAL | Status: DC

## 2012-03-19 MED ORDER — ACYCLOVIR 400 MG PO TABS: 400.0000 mg | ORAL_TABLET | Freq: Three times a day (TID) | ORAL | Status: DC

## 2012-03-19 NOTE — Assessment & Plan Note (Signed)
dymista to cont

## 2012-03-19 NOTE — Progress Notes (Signed)
Patient ID: Trevor Zavala, male   DOB: Dec 20, 1965, 46 y.o.   MRN: [id number]  Subjective:    Patient ID: Trevor Zavala, male    DOB: July 31, 1966, 46 y.o.   MRN: [id number]  HPI  The patient presents for a follow-up of  chronic GERD, COPD, controlled with medicines C/o post-nasal drip  BP Readings from Last 3 Encounters:  03/19/12 150/100  08/22/11 120/88  12/31/10 130/84   Wt Readings from Last 3 Encounters:  03/19/12 189 lb (85.73 kg)  08/22/11 192 lb (87.091 kg)  12/31/10 191 lb (86.637 kg)      Review of Systems  Constitutional: Negative for appetite change, fatigue and unexpected weight change.  HENT: Positive for congestion and rhinorrhea. Negative for nosebleeds, sore throat, sneezing, trouble swallowing and neck pain.   Eyes: Negative for itching and visual disturbance.  Respiratory: Negative for cough.   Cardiovascular: Negative for chest pain, palpitations and leg swelling.  Gastrointestinal: Negative for nausea, diarrhea, blood in stool and abdominal distention.  Genitourinary: Negative for frequency and hematuria.  Musculoskeletal: Negative for back pain, joint swelling and gait problem.  Skin: Negative for rash.  Neurological: Negative for dizziness, tremors, speech difficulty and weakness.  Psychiatric/Behavioral: Negative for sleep disturbance, dysphoric mood and agitation. The patient is not nervous/anxious.        Objective:   Physical Exam  Constitutional: He is oriented to person, place, and time. He appears well-developed.  HENT:  Mouth/Throat: Oropharynx is clear and moist.  Eyes: Conjunctivae are normal. Pupils are equal, round, and reactive to light.  Neck: Normal range of motion. No JVD present. No thyromegaly present.  Cardiovascular: Normal rate, regular rhythm, normal heart sounds and intact distal pulses.  Exam reveals no gallop and no friction rub.   No murmur heard. Pulmonary/Chest: Effort normal and breath sounds normal. No respiratory  distress. He has no wheezes. He has no rales. He exhibits no tenderness.  Abdominal: Soft. Bowel sounds are normal. He exhibits no distension and no mass. There is no tenderness. There is no rebound and no guarding.  Musculoskeletal: Normal range of motion. He exhibits no edema and no tenderness.  Lymphadenopathy:    He has no cervical adenopathy.  Neurological: He is alert and oriented to person, place, and time. He has normal reflexes. No cranial nerve deficit. He exhibits normal muscle tone. Coordination normal.  Skin: Skin is warm and dry. No rash noted.  Psychiatric: He has a normal mood and affect. His behavior is normal. Judgment and thought content normal.    Lab Results  Component Value Date   WBC 6.6 08/19/2010   HGB 17.0 08/19/2010   HCT 48.3 08/19/2010   PLT 185.0 08/19/2010   GLUCOSE 103* 08/19/2010   CHOL 174 08/19/2010   TRIG 249.0* 08/19/2010   HDL 43.60 08/19/2010   LDLDIRECT 91.6 08/19/2010   LDLCALC 85 03/29/2007   ALT 23 08/19/2010   AST 24 08/19/2010   NA 144 08/19/2010   K 5.8* 08/19/2010   CL 106 08/19/2010   CREATININE 0.8 08/19/2010   BUN 9 08/19/2010   CO2 31 08/19/2010   TSH 2.32 08/19/2010   PSA 1.42 03/29/2007    Procedure Note :    Procedure :   Point of care (POC) sonography examination   Indication: Growth on forehead                      Growth on chest  Growth on R palm   Equipment used: Sonosite M-Turbo with HFL38x/13-6 MHz transducer linear probe. The images were stored in the unit and later transferred in storage.  The patient was placed in a sitting position.  This study revealed a 1) a hypoechoic lesion over mid-sternum c/w a cyst; 2) an isoechoic lesion on upper mid-forehead c/w a lipoma 3) R palm flexor digitorum synovial thickening - fingers 3-4      Impression: 1) Chest wall lesion c/w sebaceous cyst 2) upper mid-forehead possible lipoma 3) R palm Dupuytren's          Assessment & Plan:

## 2012-03-19 NOTE — Assessment & Plan Note (Signed)
Trying to reduce -- 3/4 ppd

## 2012-03-19 NOTE — Assessment & Plan Note (Signed)
Acyclovir prn

## 2012-03-19 NOTE — Assessment & Plan Note (Signed)
Continue with current prescription therapy as reflected on the Med list.  

## 2012-03-21 ENCOUNTER — Encounter: Payer: Self-pay | Admitting: Internal Medicine

## 2012-04-05 DIAGNOSIS — D170 Benign lipomatous neoplasm of skin and subcutaneous tissue of head, face and neck: Secondary | ICD-10-CM | POA: Insufficient documentation

## 2012-04-05 DIAGNOSIS — L723 Sebaceous cyst: Secondary | ICD-10-CM | POA: Insufficient documentation

## 2012-04-05 DIAGNOSIS — M720 Palmar fascial fibromatosis [Dupuytren]: Secondary | ICD-10-CM | POA: Insufficient documentation

## 2012-04-05 NOTE — Assessment & Plan Note (Addendum)
5/13 mid chest x1 Surg consult

## 2012-04-05 NOTE — Assessment & Plan Note (Signed)
NSAIDs prn 

## 2012-04-05 NOTE — Assessment & Plan Note (Signed)
Surgical consult

## 2012-04-15 ENCOUNTER — Other Ambulatory Visit: Payer: Self-pay | Admitting: Internal Medicine

## 2012-05-31 ENCOUNTER — Ambulatory Visit: Payer: PRIVATE HEALTH INSURANCE | Admitting: Internal Medicine

## 2012-06-01 ENCOUNTER — Encounter: Payer: Self-pay | Admitting: Internal Medicine

## 2012-06-01 ENCOUNTER — Ambulatory Visit (INDEPENDENT_AMBULATORY_CARE_PROVIDER_SITE_OTHER)
Admission: RE | Admit: 2012-06-01 | Discharge: 2012-06-01 | Disposition: A | Discharge status: Home / Self Care | Payer: PRIVATE HEALTH INSURANCE | Source: Ambulatory Visit | Attending: Internal Medicine | Admitting: Internal Medicine

## 2012-06-01 ENCOUNTER — Ambulatory Visit (INDEPENDENT_AMBULATORY_CARE_PROVIDER_SITE_OTHER): Payer: PRIVATE HEALTH INSURANCE | Admitting: Internal Medicine

## 2012-06-01 VITALS — BP 142/90 | HR 85 | Temp 98.2°F | Ht 71.0 in | Wt 190.0 lb

## 2012-06-01 DIAGNOSIS — F17200 Nicotine dependence, unspecified, uncomplicated: Secondary | ICD-10-CM

## 2012-06-01 DIAGNOSIS — R062 Wheezing: Secondary | ICD-10-CM | POA: Insufficient documentation

## 2012-06-01 DIAGNOSIS — M549 Dorsalgia, unspecified: Secondary | ICD-10-CM

## 2012-06-01 DIAGNOSIS — R079 Chest pain, unspecified: Secondary | ICD-10-CM | POA: Insufficient documentation

## 2012-06-01 MED ORDER — CYCLOBENZAPRINE HCL 5 MG PO TABS: 5.0000 mg | ORAL_TABLET | Freq: Three times a day (TID) | ORAL | Status: AC | PRN

## 2012-06-01 MED ORDER — IBUPROFEN 600 MG PO TABS: 600.0000 mg | ORAL_TABLET | Freq: Three times a day (TID) | ORAL | Status: AC | PRN

## 2012-06-01 MED ORDER — VARENICLINE TARTRATE 1 MG PO TABS: 1.0000 mg | ORAL_TABLET | Freq: Two times a day (BID) | ORAL | Status: AC

## 2012-06-01 MED ORDER — VARENICLINE TARTRATE 0.5 MG X 11 & 1 MG X 42 PO MISC: ORAL | Status: AC

## 2012-06-01 NOTE — Patient Instructions (Addendum)
Take all new medications as prescribed - the ibuprofen, muscle relaxer and chantix as directed Continue all other medications as before, including the dulera every day Your EKG was OK today Please go to XRAY in the Basement for the x-ray test You will be contacted by phone if any changes need to be made immediately.  Otherwise, you will receive a letter about your results with an explanation.

## 2012-06-01 NOTE — Assessment & Plan Note (Addendum)
ECG reviewed as per emr, for cxr but suspect MSK pain, Continue all other medications as before,  to f/u any worsening symptoms or concerns

## 2012-06-05 ENCOUNTER — Encounter: Payer: Self-pay | Admitting: Internal Medicine

## 2012-06-05 NOTE — Assessment & Plan Note (Signed)
Most c/w msk pain, for flexeril prn,  to f/u any worsening symptoms or concerns

## 2012-06-05 NOTE — Assessment & Plan Note (Signed)
Mild, liekly related to noncompliance with med, encouraged compliance, re-start med

## 2012-06-05 NOTE — Progress Notes (Signed)
  Subjective:    Patient ID: Trevor Zavala, male    DOB: 03-Nov-1966, 46 y.o.   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  Wants to quit smoking, requests chantix.  Does also have some wheezing today but admits to usual noncompliance with steroid inhaler.  Pt denies chest pain, increased sob or doe, wheezing, orthopnea, PND, increased LE swelling, palpitations, dizziness or syncope except for the above.  No radiation of the CP, n/v, diaphoresis, palp or syncope, or relation to exercise.   Past Medical History  Diagnosis Date  . COPD (chronic obstructive pulmonary disease)   . GERD (gastroesophageal reflux disease)    Past Surgical History  Procedure Date  . Knee arthroscopy     LEFT    reports that he has been smoking.  He does not have any smokeless tobacco history on file. He reports that he drinks alcohol. He reports that he does not use illicit drugs. family history includes Breast cancer in his mother; Cancer (age of onset:72) in his father; Colon polyps in his father; Hypertension in his father and mother; and Skin cancer in his father.  There is no history of Colon cancer. No Known Allergies Current Outpatient Prescriptions on File Prior to Visit  Medication Sig Dispense Refill  . Azelastine-Fluticasone (DYMISTA) 137-50 MCG/ACT SUSP Place 1 Act into the nose 1 day or 1 dose.  23 g  5  . DULERA 200-5 MCG/ACT AERO USE 2 INHALATIONS TWICE A DAY  13 g  3  . NEXIUM 40 MG capsule TAKE ONE CAPSULE BY MOUTH EVERY DAY  30 capsule  2   Review of Systems Review of Systems  Constitutional: Negative for diaphoresis and unexpected  weight change.  HENT: Negative for tinnitus.   Eyes: Negative for photophobia and visual disturbance.  Respiratory: Negative for choking and stridor.   Gastrointestinal: Negative for vomiting and blood in stool.  Genitourinary: Negative for hematuria and decreased urine volume.  Musculoskeletal: Negative for gait problem.  Skin: Negative for color change and wound.  Neurological: Negative for tremors and numbness.       Objective:   Physical Exam BP 142/90  Pulse 85  Temp 98.2 F (36.8 C) (Oral)  Ht 5\' 11"  (1.803 m)  Wt 190 lb (86.183 kg)  BMI 26.50 kg/m2  SpO2 92% Physical Exam  VS noted Constitutional: Pt appears well-developed and well-nourished.  HENT: Head: Normocephalic.  Right Ear: External ear normal.  Left Ear: External ear normal.  Eyes: Conjunctivae and EOM are normal. Pupils are equal, round, and reactive to light.  Neck: Normal range of motion. Neck supple.  Cardiovascular: Normal rate and regular rhythm.   Pulmonary/Chest: Effort normal and breath sounds mild decreased with few bilat wheezes.  Abd:  Soft, NT, non-distended, + BS Neurological: Pt is alert. Not confused,  Motor/gait intact Skin: Skin is warm. No erythema. No rash, no chest wall tender Spine;  Nontender, no tender paravertebral upper and mid back Psychiatric: Pt behavior is normal. Thought content normal. 1+ nervous    Assessment & Plan:

## 2012-06-05 NOTE — Assessment & Plan Note (Signed)
Ok for chantix trial

## 2012-06-08 ENCOUNTER — Other Ambulatory Visit: Payer: Self-pay | Admitting: Internal Medicine

## 2012-07-20 ENCOUNTER — Ambulatory Visit: Payer: PRIVATE HEALTH INSURANCE | Admitting: Internal Medicine

## 2012-08-05 ENCOUNTER — Other Ambulatory Visit: Payer: Self-pay | Admitting: Internal Medicine

## 2012-09-03 ENCOUNTER — Encounter: Payer: Self-pay | Admitting: Internal Medicine

## 2012-09-03 ENCOUNTER — Ambulatory Visit (INDEPENDENT_AMBULATORY_CARE_PROVIDER_SITE_OTHER): Payer: PRIVATE HEALTH INSURANCE | Admitting: Internal Medicine

## 2012-09-03 VITALS — BP 128/80 | HR 80 | Temp 97.8°F | Resp 16 | Wt 193.0 lb

## 2012-09-03 DIAGNOSIS — F17200 Nicotine dependence, unspecified, uncomplicated: Secondary | ICD-10-CM

## 2012-09-03 DIAGNOSIS — J449 Chronic obstructive pulmonary disease, unspecified: Secondary | ICD-10-CM

## 2012-09-03 DIAGNOSIS — K219 Gastro-esophageal reflux disease without esophagitis: Secondary | ICD-10-CM

## 2012-09-03 DIAGNOSIS — J310 Chronic rhinitis: Secondary | ICD-10-CM

## 2012-09-03 MED ORDER — VITAMIN D 1000 UNITS PO TABS: 1000.0000 [IU] | ORAL_TABLET | Freq: Every day | ORAL | Status: AC

## 2012-09-03 MED ORDER — ESOMEPRAZOLE MAGNESIUM 40 MG PO CPDR: 40.0000 mg | DELAYED_RELEASE_CAPSULE | Freq: Every day | ORAL | Status: DC

## 2012-09-03 MED ORDER — MOMETASONE FURO-FORMOTEROL FUM 200-5 MCG/ACT IN AERO: INHALATION_SPRAY | RESPIRATORY_TRACT | Status: DC

## 2012-09-03 NOTE — Assessment & Plan Note (Signed)
Continue with current prescription therapy as reflected on the Med list.  

## 2012-09-03 NOTE — Assessment & Plan Note (Signed)
Doing better.   

## 2012-09-03 NOTE — Progress Notes (Signed)
P Subjective:    Patient ID: Trevor Zavala, male    DOB: 1965/11/19, 46 y.o.   MRN: [id number]  HPI  The patient presents for a follow-up of  chronic GERD, COPD, controlled with medicines C/o post-nasal drip. He is down to 1 ppd  BP Readings from Last 3 Encounters:  09/03/12 128/80  06/01/12 142/90  03/19/12 150/100   Wt Readings from Last 3 Encounters:  09/03/12 193 lb (87.544 kg)  06/01/12 190 lb (86.183 kg)  03/19/12 189 lb (85.73 kg)      Review of Systems  Constitutional: Negative for appetite change, fatigue and unexpected weight change.  HENT: Positive for congestion and rhinorrhea. Negative for nosebleeds, sore throat, sneezing, trouble swallowing and neck pain.   Eyes: Negative for itching and visual disturbance.  Respiratory: Negative for cough.   Cardiovascular: Negative for chest pain, palpitations and leg swelling.  Gastrointestinal: Negative for nausea, diarrhea, blood in stool and abdominal distention.  Genitourinary: Negative for frequency and hematuria.  Musculoskeletal: Negative for back pain, joint swelling and gait problem.  Skin: Negative for rash.  Neurological: Negative for dizziness, tremors, speech difficulty and weakness.  Psychiatric/Behavioral: Negative for disturbed wake/sleep cycle, dysphoric mood and agitation. The patient is not nervous/anxious.        Objective:   Physical Exam  Constitutional: He is oriented to person, place, and time. He appears well-developed.  HENT:  Mouth/Throat: Oropharynx is clear and moist.  Eyes: Conjunctivae normal are normal. Pupils are equal, round, and reactive to light.  Neck: Normal range of motion. No JVD present. No thyromegaly present.  Cardiovascular: Normal rate, regular rhythm, normal heart sounds and intact distal pulses.  Exam reveals no gallop and no friction rub.   No murmur heard. Pulmonary/Chest: Effort normal and breath sounds normal. No respiratory distress. He has no wheezes. He has no  rales. He exhibits no tenderness.  Abdominal: Soft. Bowel sounds are normal. He exhibits no distension and no mass. There is no tenderness. There is no rebound and no guarding.  Musculoskeletal: Normal range of motion. He exhibits no edema and no tenderness.  Lymphadenopathy:    He has no cervical adenopathy.  Neurological: He is alert and oriented to person, place, and time. He has normal reflexes. No cranial nerve deficit. He exhibits normal muscle tone. Coordination normal.  Skin: Skin is warm and dry. No rash noted.  Psychiatric: He has a normal mood and affect. His behavior is normal. Judgment and thought content normal.    Lab Results  Component Value Date   WBC 6.6 08/19/2010   HGB 17.0 08/19/2010   HCT 48.3 08/19/2010   PLT 185.0 08/19/2010   GLUCOSE 103* 08/19/2010   CHOL 174 08/19/2010   TRIG 249.0* 08/19/2010   HDL 43.60 08/19/2010   LDLDIRECT 91.6 08/19/2010   LDLCALC 85 03/29/2007   ALT 23 08/19/2010   AST 24 08/19/2010   NA 144 08/19/2010   K 5.8* 08/19/2010   CL 106 08/19/2010   CREATININE 0.8 08/19/2010   BUN 9 08/19/2010   CO2 31 08/19/2010   TSH 2.32 08/19/2010   PSA 1.42 03/29/2007              Assessment & Plan:

## 2012-09-05 ENCOUNTER — Encounter: Payer: Self-pay | Admitting: Internal Medicine

## 2012-09-05 NOTE — Assessment & Plan Note (Signed)
Continue with current prescription therapy as reflected on the Med list.  

## 2012-11-24 ENCOUNTER — Other Ambulatory Visit: Payer: Self-pay | Admitting: Internal Medicine

## 2013-03-04 ENCOUNTER — Ambulatory Visit (INDEPENDENT_AMBULATORY_CARE_PROVIDER_SITE_OTHER): Payer: PRIVATE HEALTH INSURANCE | Admitting: Internal Medicine

## 2013-03-04 ENCOUNTER — Encounter: Payer: Self-pay | Admitting: Internal Medicine

## 2013-03-04 VITALS — BP 130/90 | HR 68 | Temp 98.6°F | Resp 16 | Wt 185.0 lb

## 2013-03-04 DIAGNOSIS — K219 Gastro-esophageal reflux disease without esophagitis: Secondary | ICD-10-CM

## 2013-03-04 DIAGNOSIS — J4489 Other specified chronic obstructive pulmonary disease: Secondary | ICD-10-CM

## 2013-03-04 DIAGNOSIS — J449 Chronic obstructive pulmonary disease, unspecified: Secondary | ICD-10-CM

## 2013-03-04 NOTE — Assessment & Plan Note (Signed)
Discussed  Continue with current prescription therapy as reflected on the Med list.  

## 2013-03-04 NOTE — Progress Notes (Signed)
  Subjective:    HPI  The patient presents for a follow-up of  chronic GERD, COPD, controlled with medicines C/o post-nasal drip. He is down to <1 ppd now  BP Readings from Last 3 Encounters:  03/04/13 130/90  09/03/12 128/80  06/01/12 142/90   Wt Readings from Last 3 Encounters:  03/04/13 185 lb (83.915 kg)  09/03/12 193 lb (87.544 kg)  06/01/12 190 lb (86.183 kg)      Review of Systems  Constitutional: Negative for appetite change, fatigue and unexpected weight change.  HENT: Positive for congestion and rhinorrhea. Negative for nosebleeds, sore throat, sneezing, trouble swallowing and neck pain.   Eyes: Negative for itching and visual disturbance.  Respiratory: Negative for cough.   Cardiovascular: Negative for chest pain, palpitations and leg swelling.  Gastrointestinal: Negative for nausea, diarrhea, blood in stool and abdominal distention.  Genitourinary: Negative for frequency and hematuria.  Musculoskeletal: Negative for back pain, joint swelling and gait problem.  Skin: Negative for rash.  Neurological: Negative for dizziness, tremors, speech difficulty and weakness.  Psychiatric/Behavioral: Negative for sleep disturbance, dysphoric mood and agitation. The patient is not nervous/anxious.        Objective:   Physical Exam  Constitutional: He is oriented to person, place, and time. He appears well-developed.  HENT:  Mouth/Throat: Oropharynx is clear and moist.  Eyes: Conjunctivae are normal. Pupils are equal, round, and reactive to light.  Neck: Normal range of motion. No JVD present. No thyromegaly present.  Cardiovascular: Normal rate, regular rhythm, normal heart sounds and intact distal pulses.  Exam reveals no gallop and no friction rub.   No murmur heard. Pulmonary/Chest: Effort normal and breath sounds normal. No respiratory distress. He has no wheezes. He has no rales. He exhibits no tenderness.  Abdominal: Soft. Bowel sounds are normal. He exhibits no  distension and no mass. There is no tenderness. There is no rebound and no guarding.  Musculoskeletal: Normal range of motion. He exhibits no edema and no tenderness.  Lymphadenopathy:    He has no cervical adenopathy.  Neurological: He is alert and oriented to person, place, and time. He has normal reflexes. No cranial nerve deficit. He exhibits normal muscle tone. Coordination normal.  Skin: Skin is warm and dry. No rash noted.  Psychiatric: He has a normal mood and affect. His behavior is normal. Judgment and thought content normal.    Lab Results  Component Value Date   WBC 6.6 08/19/2010   HGB 17.0 08/19/2010   HCT 48.3 08/19/2010   PLT 185.0 08/19/2010   GLUCOSE 103* 08/19/2010   CHOL 174 08/19/2010   TRIG 249.0* 08/19/2010   HDL 43.60 08/19/2010   LDLDIRECT 91.6 08/19/2010   LDLCALC 85 03/29/2007   ALT 23 08/19/2010   AST 24 08/19/2010   NA 144 08/19/2010   K 5.8* 08/19/2010   CL 106 08/19/2010   CREATININE 0.8 08/19/2010   BUN 9 08/19/2010   CO2 31 08/19/2010   TSH 2.32 08/19/2010   PSA 1.42 03/29/2007              Assessment & Plan:

## 2013-03-04 NOTE — Assessment & Plan Note (Signed)
Continue with current prescription therapy as reflected on the Med list.  

## 2013-03-14 ENCOUNTER — Other Ambulatory Visit: Payer: Self-pay | Admitting: Internal Medicine

## 2013-05-08 ENCOUNTER — Other Ambulatory Visit: Payer: Self-pay | Admitting: Internal Medicine

## 2013-06-06 ENCOUNTER — Other Ambulatory Visit: Payer: Self-pay | Admitting: *Deleted

## 2013-06-06 MED ORDER — ESOMEPRAZOLE MAGNESIUM 40 MG PO CPDR: 40.0000 mg | DELAYED_RELEASE_CAPSULE | Freq: Every day | ORAL | Status: DC

## 2013-06-06 MED ORDER — MOMETASONE FURO-FORMOTEROL FUM 200-5 MCG/ACT IN AERO: 2.0000 | INHALATION_SPRAY | Freq: Two times a day (BID) | RESPIRATORY_TRACT | Status: DC

## 2013-09-02 ENCOUNTER — Ambulatory Visit: Payer: PRIVATE HEALTH INSURANCE | Admitting: Internal Medicine

## 2013-09-05 ENCOUNTER — Other Ambulatory Visit: Payer: Self-pay | Admitting: Internal Medicine

## 2013-09-12 ENCOUNTER — Ambulatory Visit: Payer: PRIVATE HEALTH INSURANCE | Admitting: Internal Medicine

## 2013-09-15 ENCOUNTER — Other Ambulatory Visit: Payer: Self-pay

## 2013-10-04 ENCOUNTER — Encounter: Payer: PRIVATE HEALTH INSURANCE | Admitting: Internal Medicine

## 2013-10-07 ENCOUNTER — Encounter: Payer: Self-pay | Admitting: Internal Medicine

## 2013-10-07 ENCOUNTER — Ambulatory Visit (INDEPENDENT_AMBULATORY_CARE_PROVIDER_SITE_OTHER): Payer: PRIVATE HEALTH INSURANCE | Admitting: Internal Medicine

## 2013-10-07 ENCOUNTER — Other Ambulatory Visit (INDEPENDENT_AMBULATORY_CARE_PROVIDER_SITE_OTHER): Payer: PRIVATE HEALTH INSURANCE

## 2013-10-07 VITALS — BP 148/90 | HR 84 | Temp 98.4°F | Resp 16 | Ht 71.0 in | Wt 176.0 lb

## 2013-10-07 DIAGNOSIS — L259 Unspecified contact dermatitis, unspecified cause: Secondary | ICD-10-CM

## 2013-10-07 DIAGNOSIS — L723 Sebaceous cyst: Secondary | ICD-10-CM

## 2013-10-07 DIAGNOSIS — Z23 Encounter for immunization: Secondary | ICD-10-CM

## 2013-10-07 DIAGNOSIS — Z0000 Encounter for general adult medical examination without abnormal findings: Secondary | ICD-10-CM

## 2013-10-07 DIAGNOSIS — L309 Dermatitis, unspecified: Secondary | ICD-10-CM

## 2013-10-07 LAB — BASIC METABOLIC PANEL
CO2: 30 mEq/L (ref 19–32)
Calcium: 9.4 mg/dL (ref 8.4–10.5)
Creatinine, Ser: 0.7 mg/dL (ref 0.4–1.5)
GFR: 131.72 mL/min (ref >60.00)
Potassium: 4 mEq/L (ref 3.5–5.1)
Sodium: 138 mEq/L (ref 135–145)

## 2013-10-07 LAB — HEPATIC FUNCTION PANEL
ALT: 15 U/L (ref 0–53)
AST: 17 U/L (ref 0–37)
Albumin: 3.9 g/dL (ref 3.5–5.2)
Alkaline Phosphatase: 68 U/L (ref 39–117)
Total Protein: 6.9 g/dL (ref 6.0–8.3)

## 2013-10-07 LAB — URINALYSIS
Bilirubin Urine: NEGATIVE
Hgb urine dipstick: NEGATIVE
Ketones, ur: NEGATIVE
Leukocytes, UA: NEGATIVE
Nitrite: NEGATIVE
Total Protein, Urine: NEGATIVE
Urine Glucose: NEGATIVE
pH: 7.5 (ref 5.0–8.0)

## 2013-10-07 LAB — CBC WITH DIFFERENTIAL/PLATELET
Basophils Absolute: 0 10*3/uL (ref 0.0–0.1)
Eosinophils Relative: 0.6 % (ref 0.0–5.0)
HCT: 47.2 % (ref 39.0–52.0)
Lymphs Abs: 1.3 10*3/uL (ref 0.7–4.0)
MCHC: 34.9 g/dL (ref 30.0–36.0)
Monocytes Absolute: 0.5 10*3/uL (ref 0.1–1.0)
Monocytes Relative: 7.4 % (ref 3.0–12.0)
Neutro Abs: 5.3 10*3/uL (ref 1.4–7.7)
Neutrophils Relative %: 73.3 % (ref 43.0–77.0)
Platelets: 193 10*3/uL (ref 150.0–400.0)
RDW: 11.9 % (ref 11.5–14.6)
WBC: 7.2 10*3/uL (ref 4.5–10.5)

## 2013-10-07 LAB — TSH: TSH: 1.05 u[IU]/mL (ref 0.35–5.50)

## 2013-10-07 MED ORDER — VITAMIN D 1000 UNITS PO TABS: 1000.0000 [IU] | ORAL_TABLET | Freq: Every day | ORAL | Status: AC

## 2013-10-07 MED ORDER — TRIAMCINOLONE ACETONIDE 0.5 % EX OINT: 1.0000 "application " | TOPICAL_OINTMENT | Freq: Two times a day (BID) | CUTANEOUS | Status: DC

## 2013-10-07 MED ORDER — ESOMEPRAZOLE MAGNESIUM 40 MG PO CPDR: 40.0000 mg | DELAYED_RELEASE_CAPSULE | Freq: Every day | ORAL | Status: DC

## 2013-10-07 NOTE — Assessment & Plan Note (Signed)
We discussed age appropriate health related issues, including available/recomended screening tests and vaccinations. We discussed a need for adhering to healthy diet and exercise. Labs/EKG were reviewed/ordered. All questions were answered.  Stop smoking Pneumovax

## 2013-10-07 NOTE — Progress Notes (Signed)
Pre visit review using our clinic review tool, if applicable. No additional management support is needed unless otherwise documented below in the visit note. 

## 2013-10-07 NOTE — Progress Notes (Signed)
  Subjective:    HPI  The patient is here for a wellness exam. The patient has been doing well overall without major physical or psychological issues going on lately. BP is nl at home. C/o R knee pain - Dr Thurston Hole  The patient presents for a follow-up of  chronic GERD, COPD, controlled with medicines C/o post-nasal drip. He is down to <1 ppd now  BP Readings from Last 3 Encounters:  10/07/13 148/90  03/04/13 130/90  09/03/12 128/80   Wt Readings from Last 3 Encounters:  10/07/13 176 lb (79.833 kg)  03/04/13 185 lb (83.915 kg)  09/03/12 193 lb (87.544 kg)      Review of Systems  Constitutional: Negative for appetite change, fatigue and unexpected weight change.  HENT: Positive for congestion and rhinorrhea. Negative for nosebleeds, sneezing, sore throat and trouble swallowing.   Eyes: Negative for itching and visual disturbance.  Respiratory: Negative for cough.   Cardiovascular: Negative for chest pain, palpitations and leg swelling.  Gastrointestinal: Negative for nausea, diarrhea, blood in stool and abdominal distention.  Genitourinary: Negative for frequency and hematuria.  Musculoskeletal: Negative for back pain, gait problem, joint swelling and neck pain.  Skin: Negative for rash.  Neurological: Negative for dizziness, tremors, speech difficulty and weakness.  Psychiatric/Behavioral: Negative for sleep disturbance, dysphoric mood and agitation. The patient is not nervous/anxious.        Objective:   Physical Exam  Constitutional: He is oriented to person, place, and time. He appears well-developed.  HENT:  Mouth/Throat: Oropharynx is clear and moist.  Eyes: Conjunctivae are normal. Pupils are equal, round, and reactive to light.  Neck: Normal range of motion. No JVD present. No thyromegaly present.  Cardiovascular: Normal rate, regular rhythm, normal heart sounds and intact distal pulses.  Exam reveals no gallop and no friction rub.   No murmur  heard. Pulmonary/Chest: Effort normal and breath sounds normal. No respiratory distress. He has no wheezes. He has no rales. He exhibits no tenderness.  Abdominal: Soft. Bowel sounds are normal. He exhibits no distension and no mass. There is no tenderness. There is no rebound and no guarding.  Musculoskeletal: Normal range of motion. He exhibits no edema and no tenderness.  Lymphadenopathy:    He has no cervical adenopathy.  Neurological: He is alert and oriented to person, place, and time. He has normal reflexes. No cranial nerve deficit. He exhibits normal muscle tone. Coordination normal.  Skin: Skin is warm and dry. No rash noted.  Psychiatric: He has a normal mood and affect. His behavior is normal. Judgment and thought content normal.  Dry skin on fingers, cracks  Lab Results  Component Value Date   WBC 6.6 08/19/2010   HGB 17.0 08/19/2010   HCT 48.3 08/19/2010   PLT 185.0 08/19/2010   GLUCOSE 103* 08/19/2010   CHOL 174 08/19/2010   TRIG 249.0* 08/19/2010   HDL 43.60 08/19/2010   LDLDIRECT 91.6 08/19/2010   LDLCALC 85 03/29/2007   ALT 23 08/19/2010   AST 24 08/19/2010   NA 144 08/19/2010   K 5.8* 08/19/2010   CL 106 08/19/2010   CREATININE 0.8 08/19/2010   BUN 9 08/19/2010   CO2 31 08/19/2010   TSH 2.32 08/19/2010   PSA 1.42 03/29/2007              Assessment & Plan:

## 2013-10-07 NOTE — Assessment & Plan Note (Signed)
Triamc oint prn °

## 2013-10-07 NOTE — Assessment & Plan Note (Signed)
5/13, 11/14 mid chest and scalp Surg ref.

## 2013-10-18 ENCOUNTER — Encounter (INDEPENDENT_AMBULATORY_CARE_PROVIDER_SITE_OTHER): Payer: Self-pay | Admitting: Surgery

## 2013-10-18 ENCOUNTER — Ambulatory Visit (INDEPENDENT_AMBULATORY_CARE_PROVIDER_SITE_OTHER): Payer: PRIVATE HEALTH INSURANCE | Admitting: Surgery

## 2013-10-18 VITALS — BP 130/70 | HR 74 | Temp 98.0°F | Resp 18 | Ht 70.0 in | Wt 177.0 lb

## 2013-10-18 DIAGNOSIS — D170 Benign lipomatous neoplasm of skin and subcutaneous tissue of head, face and neck: Secondary | ICD-10-CM | POA: Insufficient documentation

## 2013-10-18 DIAGNOSIS — L723 Sebaceous cyst: Secondary | ICD-10-CM

## 2013-10-18 NOTE — Progress Notes (Signed)
Patient ID: Trevor Zavala, male   DOB: 11/05/1966, 47 y.o.   MRN: [id number]  Chief Complaint  Patient presents with  . New Evaluation    frontal mass/chest mass    HPI Trevor Zavala is a 47 y.o. male.  Patient is sent at the request of Dr. Plotnikof 484 head mass and a mass over his chest.  The masses have been present for a number of years and had been getting larger. He would like to have these removed. No redness or drainage from either mass. Minimal discomfort noted with each. HPI  Past Medical History  Diagnosis Date  . COPD (chronic obstructive pulmonary disease)   . GERD (gastroesophageal reflux disease)     Past Surgical History  Procedure Laterality Date  . Knee arthroscopy      LEFT    Family History  Problem Relation Age of Onset  . Breast cancer Mother   . Hypertension Mother   . Skin cancer Father   . Hypertension Father   . Colon polyps Father   . Cancer Father 72    b cell lymphoma  . Colon cancer Neg Hx     Social History History  Substance Use Topics  . Smoking status: Current Every Day Smoker -- 1.00 packs/day  . Smokeless tobacco: Not on file  . Alcohol Use: Yes     Comment: SOCIAL    No Known Allergies  Current Outpatient Prescriptions  Medication Sig Dispense Refill  . amoxicillin-clavulanate (AUGMENTIN) 875-125 MG per tablet Take 1 tablet by mouth 2 (two) times daily.      . cholecalciferol (VITAMIN D) 1000 UNITS tablet Take 1 tablet (1,000 Units total) by mouth daily.  100 tablet  3  . esomeprazole (NEXIUM) 40 MG capsule Take 1 capsule (40 mg total) by mouth daily before breakfast.  90 capsule  2  . mometasone-formoterol (DULERA) 200-5 MCG/ACT AERO Inhale 2 puffs into the lungs 2 (two) times daily.  39 g  3  . triamcinolone ointment (KENALOG) 0.5 % Apply 1 application topically 2 (two) times daily.  60 g  3  . Azelastine-Fluticasone (DYMISTA) 137-50 MCG/ACT SUSP Place 1 Act into the nose 1 day or 1 dose.  23 g  5   No current  facility-administered medications for this visit.    Review of Systems Review of Systems  Constitutional: Negative for fever, chills and unexpected weight change.  HENT: Negative for congestion, hearing loss, sore throat, trouble swallowing and voice change.   Eyes: Negative for visual disturbance.  Respiratory: Negative for cough and wheezing.   Cardiovascular: Negative for chest pain, palpitations and leg swelling.  Gastrointestinal: Negative for nausea, vomiting, abdominal pain, diarrhea, constipation, blood in stool, abdominal distention, anal bleeding and rectal pain.  Genitourinary: Negative for hematuria and difficulty urinating.  Musculoskeletal: Negative for arthralgias.  Skin: Negative for rash and wound.  Neurological: Negative for seizures, syncope, weakness and headaches.  Hematological: Negative for adenopathy. Does not bruise/bleed easily.  Psychiatric/Behavioral: Negative for confusion.    Blood pressure 130/70, pulse 74, temperature 98 F (36.7 C), resp. rate 18, height 5' 10" (1.778 m), weight 177 lb (80.287 kg).  Physical Exam Physical Exam  Constitutional: He is oriented to person, place, and time. He appears well-developed and well-nourished.  HENT:  Head: Normocephalic and atraumatic.    Cardiovascular: Normal rate and regular rhythm.   Pulmonary/Chest: Effort normal and breath sounds normal.    Musculoskeletal: Normal range of motion.  Neurological: He is alert and   oriented to person, place, and time.  Skin: Skin is warm and dry.  Psychiatric: He has a normal mood and affect. His behavior is normal. Thought content normal.    Data Reviewed NONE  Assessment    3 cm lipoma forehead  A 3 cm epidermal inclusion cyst chest    Plan    The patient would like to have both removed. Procedure discussed with the patient as well as complications and long term expectations. He wishes to proceed.The procedure has been discussed with the patient.   Alternative therapies have been discussed with the patient.  Operative risks include bleeding,  Infection,  Organ injury,  Nerve injury,  Blood vessel injury,  DVT,  Pulmonary embolism,  Death,  And possible reoperation.  Medical management risks include worsening of present situation.  The success of the procedure is 50 -90 % at treating patients symptoms.  The patient understands and agrees to proceed.       Kyleeann Cremeans A. 10/18/2013, 4:09 PM    

## 2013-10-18 NOTE — Patient Instructions (Signed)
Epidermal Cyst An epidermal cyst is sometimes called a sebaceous cyst, epidermal inclusion cyst, or infundibular cyst. These cysts usually contain a substance that looks "pasty" or "cheesy" and may have a bad smell. This substance is a protein called keratin. Epidermal cysts are usually found on the face, neck, or trunk. They may also occur in the vaginal area or other parts of the genitalia of both men and women. Epidermal cysts are usually small, painless, slow-growing bumps or lumps that move freely under the skin. It is important not to try to pop them. This may cause an infection and lead to tenderness and swelling. CAUSES  Epidermal cysts may be caused by a deep penetrating injury to the skin or a plugged hair follicle, often associated with acne. SYMPTOMS  Epidermal cysts can become inflamed and cause:  Redness.  Tenderness.  Increased temperature of the skin over the bumps or lumps.  Grayish-white, bad smelling material that drains from the bump or lump. DIAGNOSIS  Epidermal cysts are easily diagnosed by your caregiver during an exam. Rarely, a tissue sample (biopsy) may be taken to rule out other conditions that may resemble epidermal cysts. TREATMENT   Epidermal cysts often get better and disappear on their own. They are rarely ever cancerous.  If a cyst becomes infected, it may become inflamed and tender. This may require opening and draining the cyst. Treatment with antibiotics may be necessary. When the infection is gone, the cyst may be removed with minor surgery.  Small, inflamed cysts can often be treated with antibiotics or by injecting steroid medicines.  Sometimes, epidermal cysts become large and bothersome. If this happens, surgical removal in your caregiver's office may be necessary. HOME CARE INSTRUCTIONS  Only take over-the-counter or prescription medicines as directed by your caregiver.  Take your antibiotics as directed. Finish them even if you start to feel  better. SEEK MEDICAL CARE IF:   Your cyst becomes tender, red, or swollen.  Your condition is not improving or is getting worse.  You have any other questions or concerns. MAKE SURE YOU:  Understand these instructions.  Will watch your condition.  Will get help right away if you are not doing well or get worse. Document Released: 09/27/2004 Document Revised: 01/19/2012 Document Reviewed: 05/05/2011 [employer] Patient Information 2014 [employer]. Lipoma A lipoma is a noncancerous (benign) tumor composed of fat cells. They are usually found under the skin (subcutaneous). A lipoma may occur in any tissue of the body that contains fat. Common areas for lipomas to appear include the back, shoulders, buttocks, and thighs. Lipomas are a very common soft tissue growth. They are soft and grow slowly. Most problems caused by a lipoma depend on where it is growing. DIAGNOSIS  A lipoma can be diagnosed with a physical exam. These tumors rarely become cancerous, but radiographic studies can help determine this for certain. Studies used may include:  Computerized X-ray scans (CT or CAT scan).  Computerized magnetic scans (MRI). TREATMENT  Small lipomas that are not causing problems may be watched. If a lipoma continues to enlarge or causes problems, removal is often the best treatment. Lipomas can also be removed to improve appearance. Surgery is done to remove the fatty cells and the surrounding capsule. Most often, this is done with medicine that numbs the area (local anesthetic). The removed tissue is examined under a microscope to make sure it is not cancerous. Keep all follow-up appointments with your caregiver. SEEK MEDICAL CARE IF:   The lipoma becomes larger   or hard.  The lipoma becomes painful, red, or increasingly swollen. These could be signs of infection or a more serious condition. Document Released: 10/17/2002 Document Revised: 01/19/2012 Document Reviewed: 03/29/2010 [employer]  Patient Information 2014 [employer].  

## 2013-10-28 ENCOUNTER — Encounter (INDEPENDENT_AMBULATORY_CARE_PROVIDER_SITE_OTHER): Payer: Self-pay | Admitting: General Surgery

## 2013-10-28 ENCOUNTER — Telehealth (INDEPENDENT_AMBULATORY_CARE_PROVIDER_SITE_OTHER): Payer: Self-pay

## 2013-10-28 ENCOUNTER — Ambulatory Visit (INDEPENDENT_AMBULATORY_CARE_PROVIDER_SITE_OTHER): Payer: PRIVATE HEALTH INSURANCE | Admitting: General Surgery

## 2013-10-28 VITALS — BP 138/80 | HR 74 | Resp 18 | Ht 71.0 in | Wt 175.0 lb

## 2013-10-28 DIAGNOSIS — L723 Sebaceous cyst: Secondary | ICD-10-CM

## 2013-10-28 DIAGNOSIS — L089 Local infection of the skin and subcutaneous tissue, unspecified: Secondary | ICD-10-CM | POA: Insufficient documentation

## 2013-10-28 MED ORDER — OXYCODONE-ACETAMINOPHEN 5-325 MG PO TABS: 1.0000 | ORAL_TABLET | Freq: Four times a day (QID) | ORAL | Status: DC | PRN

## 2013-10-28 MED ORDER — DOXYCYCLINE HYCLATE 100 MG PO CAPS: 100.0000 mg | ORAL_CAPSULE | Freq: Two times a day (BID) | ORAL | Status: DC

## 2013-10-28 NOTE — Telephone Encounter (Signed)
Pts wife called stating that Trevor Zavala was seen for forehead lipoma aby Dr Luisa Hart and is scheduled for surgery. She states Dr Luisa Hart pressed on a sebaceous cyst on pts chest at [facility] to see if it would drain. Now area is read,swollen,painful and inflammed. Trevor Zavala wide would like area cked incase it is becoming infected.

## 2013-10-28 NOTE — Patient Instructions (Signed)
Abscess Care After An abscess (also called a boil or furuncle) is an infected area that contains a collection of pus. Signs and symptoms of an abscess include pain, tenderness, redness, or hardness, or you may feel a moveable soft area under your skin. An abscess can occur anywhere in the body. The infection may spread to surrounding tissues causing cellulitis. A cut (incision) by the surgeon was made over your abscess and the pus was drained out. Gauze may have been packed into the space to provide a drain that will allow the cavity to heal from the inside outwards. The boil may be painful for 5 to 7 days. Most people with a boil do not have high fevers. Your abscess, if seen early, may not have localized, and may not have been lanced. If not, another appointment may be required for this if it does not get better on its own or with medications.  HOME CARE INSTRUCTIONS   Only take over-the-counter or prescription medicines for pain, discomfort, or fever as directed by your caregiver.   When you bathe, soak and then remove gauze and iodoform packs. You may then wash the wound gently with mild soapy water. Cover with gauze.   SEEK IMMEDIATE MEDICAL CARE IF:   You develop increased pain, swelling, redness, drainage, or bleeding in the wound site.   You develop signs of generalized infection including muscle aches, chills, fever, or a general ill feeling.   An oral temperature above 102 F (38.9 C) develops, not controlled by medication.  See your caregiver for a recheck if you develop any of the symptoms described above. If medications (antibiotics) were prescribed, take them as directed.

## 2013-10-29 NOTE — Progress Notes (Signed)
Subjective:     Patient ID: Trevor Zavala, male   DOB: [birth year], 47 y.o.   MRN: [id number]  HPI 47 year old Caucasian male with a forehead lipoma and a chest wall sebaceous cyst She was scheduled to have outpatient surgery by Dr. Luisa Hart in early January comes in complaining of increased discomfort and redness around the chest wall sebaceous cyst. He denies any fevers or chills. He states that it has been sore and red for a day or 2. He was concerned that it may be infected.  PMHx, PSHx, SOCHx, FAMHx, ALL reviewed and unchanged  Review of Systems 8 point review of systems was performed and all systems are negative except for what is mentioned in history of present illness    Objective:   Physical Exam  Vitals reviewed. Constitutional: He appears well-developed and well-nourished. No distress.  HENT:  Head: Normocephalic and atraumatic.  Forehead lipoma  Eyes: Conjunctivae are normal. No scleral icterus.  Pulmonary/Chest: Effort normal. No respiratory distress.    Upper chest wall epidermoid inclusion cyst. About 3 x 3 cm. There is about an area of 3 x 3 cm of redness and blanching erythema  Skin: Skin is warm and dry. He is not diaphoretic.   BP 138/80  Pulse 74  Resp 18  Ht 5\' 11"  (1.803 m)  Wt 175 lb (79.379 kg)  BMI 24.42 kg/m2     Assessment:     Infected chest wall epidermoid inclusion cyst     Plan:     It appears that his chest wall epidermoid inclusion cyst has become infected. I recommended incision and drainage to expedite resolution of the cellulitis. After obtaining verbal consent the area was prepped with ChloraPrep. 2 cc of 1% Xylocaine mixed with epinephrine and sodium bicarbonate was infiltrated over the area. I then made a 1.5 cm incision. There was drainage of seropurulent sebaceous material. The patient tolerated the procedure well. A quarter inch conform gauze was packed into the wound covered with dry gauze. He was instructed to remove the outer gauze as  needed. I asked them to remove the packing strip on Saturday and then just cover the area with a dry gauze. He was given a prescription for antibiotics. He was also given a prescription for pain medicine as well as for work this weekend. He was scheduled to see Dr. Luisa Hart a few days before his scheduled surgery in January. Hopefully the current infection will resolve so he can proceed with surgery as scheduled. I advised him that the surgery may have to be delayed  Mary Sella. Andrey Campanile, MD, FACS General, Bariatric, & Minimally Invasive Surgery [facility], [place]

## 2013-11-11 ENCOUNTER — Encounter (HOSPITAL_BASED_OUTPATIENT_CLINIC_OR_DEPARTMENT_OTHER): Payer: Self-pay | Admitting: *Deleted

## 2013-11-14 ENCOUNTER — Ambulatory Visit (INDEPENDENT_AMBULATORY_CARE_PROVIDER_SITE_OTHER): Payer: PRIVATE HEALTH INSURANCE | Admitting: Surgery

## 2013-11-14 ENCOUNTER — Encounter (INDEPENDENT_AMBULATORY_CARE_PROVIDER_SITE_OTHER): Payer: Self-pay | Admitting: Surgery

## 2013-11-14 VITALS — BP 126/72 | HR 86 | Temp 98.0°F | Resp 18 | Ht 71.0 in | Wt 175.0 lb

## 2013-11-14 DIAGNOSIS — L723 Sebaceous cyst: Secondary | ICD-10-CM

## 2013-11-14 DIAGNOSIS — D170 Benign lipomatous neoplasm of skin and subcutaneous tissue of head, face and neck: Secondary | ICD-10-CM

## 2013-11-14 NOTE — Patient Instructions (Signed)
See you thursday

## 2013-11-14 NOTE — Progress Notes (Signed)
Patient returns for followup of sebaceous cyst on chest that was drained 3 weeks ago in the office for Dr. Redmond Pulling. He is scheduled to have this excised as well as lipoma from forehead removed later this week. He is doing well.  Exam: Wound to chest were cyst located well healed. No signs of infection.  Impression: Status post drainage of infected left chest epidermal inclusion cyst with no signs of active infection  Plan: Continue with surgery later this week.

## 2013-11-17 ENCOUNTER — Ambulatory Visit (HOSPITAL_BASED_OUTPATIENT_CLINIC_OR_DEPARTMENT_OTHER)
Admission: RE | Admit: 2013-11-17 | Discharge: 2013-11-17 | Disposition: A | Discharge status: Home / Self Care | Payer: PRIVATE HEALTH INSURANCE | Source: Ambulatory Visit | Attending: Surgery | Admitting: Surgery

## 2013-11-17 ENCOUNTER — Encounter (HOSPITAL_BASED_OUTPATIENT_CLINIC_OR_DEPARTMENT_OTHER): Payer: PRIVATE HEALTH INSURANCE | Admitting: Anesthesiology

## 2013-11-17 ENCOUNTER — Encounter (HOSPITAL_BASED_OUTPATIENT_CLINIC_OR_DEPARTMENT_OTHER): Payer: Self-pay | Admitting: *Deleted

## 2013-11-17 ENCOUNTER — Ambulatory Visit (HOSPITAL_BASED_OUTPATIENT_CLINIC_OR_DEPARTMENT_OTHER): Payer: PRIVATE HEALTH INSURANCE | Admitting: Anesthesiology

## 2013-11-17 ENCOUNTER — Encounter (HOSPITAL_BASED_OUTPATIENT_CLINIC_OR_DEPARTMENT_OTHER)
Admission: RE | Disposition: A | Discharge status: Home / Self Care | Payer: Self-pay | Source: Ambulatory Visit | Attending: Surgery

## 2013-11-17 DIAGNOSIS — J449 Chronic obstructive pulmonary disease, unspecified: Secondary | ICD-10-CM | POA: Insufficient documentation

## 2013-11-17 DIAGNOSIS — J4489 Other specified chronic obstructive pulmonary disease: Secondary | ICD-10-CM | POA: Insufficient documentation

## 2013-11-17 DIAGNOSIS — D170 Benign lipomatous neoplasm of skin and subcutaneous tissue of head, face and neck: Secondary | ICD-10-CM

## 2013-11-17 DIAGNOSIS — L723 Sebaceous cyst: Secondary | ICD-10-CM

## 2013-11-17 DIAGNOSIS — L720 Epidermal cyst: Secondary | ICD-10-CM

## 2013-11-17 DIAGNOSIS — K219 Gastro-esophageal reflux disease without esophagitis: Secondary | ICD-10-CM | POA: Insufficient documentation

## 2013-11-17 DIAGNOSIS — D1739 Benign lipomatous neoplasm of skin and subcutaneous tissue of other sites: Secondary | ICD-10-CM

## 2013-11-17 DIAGNOSIS — D1779 Benign lipomatous neoplasm of other sites: Secondary | ICD-10-CM | POA: Insufficient documentation

## 2013-11-17 DIAGNOSIS — L089 Local infection of the skin and subcutaneous tissue, unspecified: Secondary | ICD-10-CM

## 2013-11-17 HISTORY — PX: LIPOMA EXCISION: SHX5283

## 2013-11-17 HISTORY — PX: EAR CYST EXCISION: SHX22

## 2013-11-17 LAB — POCT HEMOGLOBIN-HEMACUE: Hemoglobin: 16.4 g/dL (ref 13.0–17.0)

## 2013-11-17 SURGERY — EXCISION LIPOMA
Anesthesia: General | Site: Face

## 2013-11-17 MED ORDER — LIDOCAINE HCL (CARDIAC) 20 MG/ML IV SOLN
INTRAVENOUS | Status: DC | PRN
  Administered 2013-11-17: 50 mg via INTRAVENOUS

## 2013-11-17 MED ORDER — ONDANSETRON HCL 4 MG/2ML IJ SOLN: 4.0000 mg | Freq: Once | INTRAMUSCULAR | Status: DC | PRN

## 2013-11-17 MED ORDER — DEXAMETHASONE SODIUM PHOSPHATE 4 MG/ML IJ SOLN
INTRAMUSCULAR | Status: DC | PRN
  Administered 2013-11-17: 10 mg via INTRAVENOUS

## 2013-11-17 MED ORDER — PROPOFOL 10 MG/ML IV BOLUS
INTRAVENOUS | Status: DC | PRN
  Administered 2013-11-17: 240 mg via INTRAVENOUS

## 2013-11-17 MED ORDER — LACTATED RINGERS IV SOLN
INTRAVENOUS | Status: DC
  Administered 2013-11-17 (×2): via INTRAVENOUS

## 2013-11-17 MED ORDER — MIDAZOLAM HCL 2 MG/2ML IJ SOLN
INTRAMUSCULAR | Status: AC
  Filled 2013-11-17: qty 2

## 2013-11-17 MED ORDER — ONDANSETRON HCL 4 MG/2ML IJ SOLN
INTRAMUSCULAR | Status: DC | PRN
  Administered 2013-11-17: 4 mg via INTRAVENOUS

## 2013-11-17 MED ORDER — OXYCODONE HCL 5 MG/5ML PO SOLN: 5.0000 mg | Freq: Once | ORAL | Status: DC | PRN

## 2013-11-17 MED ORDER — BUPIVACAINE-EPINEPHRINE PF 0.25-1:200000 % IJ SOLN
INTRAMUSCULAR | Status: AC
  Filled 2013-11-17: qty 30

## 2013-11-17 MED ORDER — OXYCODONE HCL 5 MG PO TABS: 5.0000 mg | ORAL_TABLET | Freq: Once | ORAL | Status: DC | PRN

## 2013-11-17 MED ORDER — HYDROCODONE-ACETAMINOPHEN 5-325 MG PO TABS: 1.0000 | ORAL_TABLET | Freq: Four times a day (QID) | ORAL | Status: DC | PRN

## 2013-11-17 MED ORDER — FENTANYL CITRATE 0.05 MG/ML IJ SOLN
INTRAMUSCULAR | Status: DC | PRN
Start: 2013-11-17 — End: 2013-11-17
  Administered 2013-11-17: 100 ug via INTRAVENOUS

## 2013-11-17 MED ORDER — BUPIVACAINE-EPINEPHRINE 0.25% -1:200000 IJ SOLN
INTRAMUSCULAR | Status: DC | PRN
  Administered 2013-11-17: 10 mL

## 2013-11-17 MED ORDER — BUPIVACAINE-EPINEPHRINE PF 0.5-1:200000 % IJ SOLN
INTRAMUSCULAR | Status: AC
  Filled 2013-11-17: qty 30

## 2013-11-17 MED ORDER — DEXTROSE 5 % IV SOLN
3.0000 g | INTRAVENOUS | Status: AC
  Administered 2013-11-17: 2 g via INTRAVENOUS

## 2013-11-17 MED ORDER — MIDAZOLAM HCL 5 MG/5ML IJ SOLN
INTRAMUSCULAR | Status: DC | PRN
  Administered 2013-11-17: 2 mg via INTRAVENOUS

## 2013-11-17 MED ORDER — FENTANYL CITRATE 0.05 MG/ML IJ SOLN
INTRAMUSCULAR | Status: AC
  Filled 2013-11-17: qty 4

## 2013-11-17 MED ORDER — MIDAZOLAM HCL 2 MG/2ML IJ SOLN: 1.0000 mg | INTRAMUSCULAR | Status: DC | PRN

## 2013-11-17 MED ORDER — CHLORHEXIDINE GLUCONATE 4 % EX LIQD: 1.0000 "application " | Freq: Once | CUTANEOUS | Status: DC

## 2013-11-17 MED ORDER — HYDROMORPHONE HCL PF 1 MG/ML IJ SOLN
0.2500 mg | INTRAMUSCULAR | Status: DC | PRN
  Administered 2013-11-17: 12:00:00 via INTRAVENOUS
  Administered 2013-11-17: 0.5 mg via INTRAVENOUS
  Filled 2013-11-17: qty 1

## 2013-11-17 MED ORDER — FENTANYL CITRATE 0.05 MG/ML IJ SOLN: 50.0000 ug | INTRAMUSCULAR | Status: DC | PRN

## 2013-11-17 SURGICAL SUPPLY — 32 items
ADH SKN CLS APL DERMABOND .7 (GAUZE/BANDAGES/DRESSINGS) ×2
APL SKNCLS STERI-STRIP NONHPOA (GAUZE/BANDAGES/DRESSINGS)
BENZOIN TINCTURE PRP APPL 2/3 (GAUZE/BANDAGES/DRESSINGS) IMPLANT
BLADE SURG 15 STRL LF DISP TIS (BLADE) ×2 IMPLANT
BLADE SURG 15 STRL SS (BLADE) ×4
COVER MAYO STAND STRL (DRAPES) ×4 IMPLANT
COVER TABLE BACK 60X90 (DRAPES) ×4 IMPLANT
DERMABOND ADVANCED (GAUZE/BANDAGES/DRESSINGS) ×2
DERMABOND ADVANCED .7 DNX12 (GAUZE/BANDAGES/DRESSINGS) IMPLANT
DRAPE PED LAPAROTOMY (DRAPES) ×4 IMPLANT
DRAPE UTILITY XL STRL (DRAPES) ×4 IMPLANT
ELECT COATED BLADE 2.86 ST (ELECTRODE) ×4 IMPLANT
ELECT REM PT RETURN 9FT ADLT (ELECTROSURGICAL) ×4
ELECTRODE REM PT RTRN 9FT ADLT (ELECTROSURGICAL) ×2 IMPLANT
GLOVE BIOGEL PI IND STRL 8 (GLOVE) ×2 IMPLANT
GLOVE BIOGEL PI INDICATOR 8 (GLOVE) ×2
GLOVE ECLIPSE 6.5 STRL STRAW (GLOVE) ×4 IMPLANT
GLOVE ECLIPSE 8.0 STRL XLNG CF (GLOVE) ×4 IMPLANT
GOWN STRL REUS W/ TWL LRG LVL3 (GOWN DISPOSABLE) ×4 IMPLANT
GOWN STRL REUS W/TWL LRG LVL3 (GOWN DISPOSABLE) ×8
NDL HYPO 25X1 1.5 SAFETY (NEEDLE) ×2 IMPLANT
NEEDLE HYPO 25X1 1.5 SAFETY (NEEDLE) ×4 IMPLANT
NS IRRIG 1000ML POUR BTL (IV SOLUTION) ×2 IMPLANT
PACK BASIN DAY SURGERY FS (CUSTOM PROCEDURE TRAY) ×4 IMPLANT
PENCIL BUTTON HOLSTER BLD 10FT (ELECTRODE) ×4 IMPLANT
SLEEVE SCD COMPRESS KNEE MED (MISCELLANEOUS) ×4 IMPLANT
SUT MON AB 4-0 PC3 18 (SUTURE) ×4 IMPLANT
SUT VIC AB 3-0 SH 27 (SUTURE) ×4
SUT VIC AB 3-0 SH 27X BRD (SUTURE) ×2 IMPLANT
SYR CONTROL 10ML LL (SYRINGE) ×4 IMPLANT
TOWEL OR 17X24 6PK STRL BLUE (TOWEL DISPOSABLE) ×8 IMPLANT
TOWEL OR NON WOVEN STRL DISP B (DISPOSABLE) ×4 IMPLANT

## 2013-11-17 NOTE — H&P (View-Only) (Signed)
Patient ID: Trevor Zavala, male   DOB: 12-Jan-1966, 48 y.o.   MRN: [id number]  Chief Complaint  Patient presents with  . New Evaluation    frontal mass/chest mass    HPI Trevor Zavala is a 48 y.o. male.  Patient is sent at the request of Dr. Rosana Berger 484 head mass and a mass over his chest.  The masses have been present for a number of years and had been getting larger. He would like to have these removed. No redness or drainage from either mass. Minimal discomfort noted with each. HPI  Past Medical History  Diagnosis Date  . COPD (chronic obstructive pulmonary disease)   . GERD (gastroesophageal reflux disease)     Past Surgical History  Procedure Laterality Date  . Knee arthroscopy      LEFT    Family History  Problem Relation Age of Onset  . Breast cancer Mother   . Hypertension Mother   . Skin cancer Father   . Hypertension Father   . Colon polyps Father   . Cancer Father 70    b cell lymphoma  . Colon cancer Neg Hx     Social History History  Substance Use Topics  . Smoking status: Current Every Day Smoker -- 1.00 packs/day  . Smokeless tobacco: Not on file  . Alcohol Use: Yes     Comment: SOCIAL    No Known Allergies  Current Outpatient Prescriptions  Medication Sig Dispense Refill  . amoxicillin-clavulanate (AUGMENTIN) 875-125 MG per tablet Take 1 tablet by mouth 2 (two) times daily.      . cholecalciferol (VITAMIN D) 1000 UNITS tablet Take 1 tablet (1,000 Units total) by mouth daily.  100 tablet  3  . esomeprazole (NEXIUM) 40 MG capsule Take 1 capsule (40 mg total) by mouth daily before breakfast.  90 capsule  2  . mometasone-formoterol (DULERA) 200-5 MCG/ACT AERO Inhale 2 puffs into the lungs 2 (two) times daily.  39 g  3  . triamcinolone ointment (KENALOG) 0.5 % Apply 1 application topically 2 (two) times daily.  60 g  3  . Azelastine-Fluticasone (DYMISTA) 137-50 MCG/ACT SUSP Place 1 Act into the nose 1 day or 1 dose.  23 g  5   No current  facility-administered medications for this visit.    Review of Systems Review of Systems  Constitutional: Negative for fever, chills and unexpected weight change.  HENT: Negative for congestion, hearing loss, sore throat, trouble swallowing and voice change.   Eyes: Negative for visual disturbance.  Respiratory: Negative for cough and wheezing.   Cardiovascular: Negative for chest pain, palpitations and leg swelling.  Gastrointestinal: Negative for nausea, vomiting, abdominal pain, diarrhea, constipation, blood in stool, abdominal distention, anal bleeding and rectal pain.  Genitourinary: Negative for hematuria and difficulty urinating.  Musculoskeletal: Negative for arthralgias.  Skin: Negative for rash and wound.  Neurological: Negative for seizures, syncope, weakness and headaches.  Hematological: Negative for adenopathy. Does not bruise/bleed easily.  Psychiatric/Behavioral: Negative for confusion.    Blood pressure 130/70, pulse 74, temperature 98 F (36.7 C), resp. rate 18, height 5\' 10"  (1.778 m), weight 177 lb (80.287 kg).  Physical Exam Physical Exam  Constitutional: He is oriented to person, place, and time. He appears well-developed and well-nourished.  HENT:  Head: Normocephalic and atraumatic.    Cardiovascular: Normal rate and regular rhythm.   Pulmonary/Chest: Effort normal and breath sounds normal.    Musculoskeletal: Normal range of motion.  Neurological: He is alert and  oriented to person, place, and time.  Skin: Skin is warm and dry.  Psychiatric: He has a normal mood and affect. His behavior is normal. Thought content normal.    Data Reviewed NONE  Assessment    3 cm lipoma forehead  A 3 cm epidermal inclusion cyst chest    Plan    The patient would like to have both removed. Procedure discussed with the patient as well as complications and long term expectations. He wishes to proceed.The procedure has been discussed with the patient.   Alternative therapies have been discussed with the patient.  Operative risks include bleeding,  Infection,  Organ injury,  Nerve injury,  Blood vessel injury,  DVT,  Pulmonary embolism,  Death,  And possible reoperation.  Medical management risks include worsening of present situation.  The success of the procedure is 50 -90 % at treating patients symptoms.  The patient understands and agrees to proceed.       Lovetta Condie A. 10/18/2013, 4:09 PM

## 2013-11-17 NOTE — Anesthesia Preprocedure Evaluation (Signed)
Anesthesia Evaluation  Patient identified by MRN, date of birth, ID band Patient awake    Reviewed: Allergy & Precautions, H&P , NPO status , Patient's Chart, lab work & pertinent test results  Airway Mallampati: I TM Distance: >3 FB Neck ROM: Full    Dental  (+) Teeth Intact and Dental Advisory Given   Pulmonary asthma , Current Smoker,  breath sounds clear to auscultation        Cardiovascular Rhythm:Regular Rate:Normal     Neuro/Psych    GI/Hepatic GERD-  Medicated and Controlled,  Endo/Other    Renal/GU      Musculoskeletal   Abdominal   Peds  Hematology   Anesthesia Other Findings   Reproductive/Obstetrics                           Anesthesia Physical Anesthesia Plan  ASA: II  Anesthesia Plan: General   Post-op Pain Management:    Induction: Intravenous  Airway Management Planned: LMA  Additional Equipment:   Intra-op Plan:   Post-operative Plan: Extubation in OR  Informed Consent: I have reviewed the patients History and Physical, chart, labs and discussed the procedure including the risks, benefits and alternatives for the proposed anesthesia with the patient or authorized representative who has indicated his/her understanding and acceptance.   Dental advisory given  Plan Discussed with: CRNA, Anesthesiologist and Surgeon  Anesthesia Plan Comments:         Anesthesia Quick Evaluation

## 2013-11-17 NOTE — Interval H&P Note (Signed)
History and Physical Interval Note:  11/17/2013 10:28 AM  Trevor Zavala  has presented today for surgery, with the diagnosis of lipoma forehead, cyst on chest  The various methods of treatment have been discussed with the patient and family. After consideration of risks, benefits and other options for treatment, the patient has consented to  Procedure(s): EXCISION LIPOMA FOREHEAD (N/A) SEBACEOUS CYST CHEST (N/A) as a surgical intervention .  The patient's history has been reviewed, patient examined, no change in status, stable for surgery.  I have reviewed the patient's chart and labs.  Questions were answered to the patient's satisfaction.     [name]

## 2013-11-17 NOTE — Transfer of Care (Signed)
Immediate Anesthesia Transfer of Care Note  Patient: Trevor Zavala  Procedure(s) Performed: Procedure(s): EXCISION LIPOMA FOREHEAD (N/A) SEBACEOUS CYST CHEST (N/A)  Patient Location: PACU  Anesthesia Type:General  Level of Consciousness: awake, alert  and oriented  Airway & Oxygen Therapy: Patient Spontanous Breathing and Patient connected to face mask oxygen  Post-op Assessment: Report given to PACU RN and Post -op Vital signs reviewed and stable  Post vital signs: Reviewed and stable  Complications: No apparent anesthesia complications

## 2013-11-17 NOTE — Anesthesia Postprocedure Evaluation (Signed)
  Anesthesia Post-op Note  Patient: Trevor Zavala  Procedure(s) Performed: Procedure(s): EXCISION LIPOMA FOREHEAD (N/A) SEBACEOUS CYST CHEST (N/A)  Patient Location: PACU  Anesthesia Type:General  Level of Consciousness: awake, alert  and oriented  Airway and Oxygen Therapy: Patient Spontanous Breathing and Patient connected to face mask oxygen  Post-op Pain: mild  Post-op Assessment: Post-op Vital signs reviewed  Post-op Vital Signs: Reviewed  Complications: No apparent anesthesia complications

## 2013-11-17 NOTE — Interval H&P Note (Signed)
History and Physical Interval Note:  11/17/2013 10:27 AM  Trevor Zavala  has presented today for surgery, with the diagnosis of lipoma forehead, cyst on chest  The various methods of treatment have been discussed with the patient and family. After consideration of risks, benefits and other options for treatment, the patient has consented to  Procedure(s): EXCISION LIPOMA FOREHEAD (N/A) SEBACEOUS CYST CHEST (N/A) as a surgical intervention .  The patient's history has been reviewed, patient examined, no change in status, stable for surgery.  I have reviewed the patient's chart and labs.  Questions were answered to the patient's satisfaction.     Trevor Zavala A.

## 2013-11-17 NOTE — Anesthesia Procedure Notes (Signed)
Procedure Name: LMA Insertion Performed by: Terrance Mass Pre-anesthesia Checklist: Patient identified, Timeout performed, Emergency Drugs available, Suction available and Patient being monitored Patient Re-evaluated:Patient Re-evaluated prior to inductionOxygen Delivery Method: Circle system utilized Preoxygenation: Pre-oxygenation with 100% oxygen Intubation Type: IV induction Ventilation: Mask ventilation without difficulty LMA: LMA inserted LMA Size: 4.0 Tube type: Oral Number of attempts: 1 Placement Confirmation: breath sounds checked- equal and bilateral and positive ETCO2 Tube secured with: Tape Dental Injury: Teeth and Oropharynx as per pre-operative assessment

## 2013-11-17 NOTE — H&P (View-Only) (Signed)
Patient returns for followup of sebaceous cyst on chest that was drained 3 weeks ago in the office for Dr. Redmond Pulling. He is scheduled to have this excised as well as lipoma from forehead removed later this week. He is doing well.  Exam: Wound to chest were cyst located well healed. No signs of infection.  Impression: Status post drainage of infected left chest epidermal inclusion cyst with no signs of active infection  Plan: Continue with surgery later this week.

## 2013-11-17 NOTE — Discharge Instructions (Signed)
GENERAL SURGERY: POST OP INSTRUCTIONS ° °1. DIET: Follow a light bland diet the first 24 hours after arrival home, such as soup, liquids, crackers, etc.  Be sure to include lots of fluids daily.  Avoid fast food or heavy meals as your are more likely to get nauseated.   °2. Take your usually prescribed home medications unless otherwise directed. °3. PAIN CONTROL: °a. Pain is best controlled by a usual combination of three different methods TOGETHER: °i. Ice/Heat °ii. Over the counter pain medication °iii. Prescription pain medication °b. Most patients will experience some swelling and bruising around the incisions.  Ice packs or heating pads (30-60 minutes up to 6 times a day) will help. Use ice for the first few days to help decrease swelling and bruising, then switch to heat to help relax tight/sore spots and speed recovery.  Some people prefer to use ice alone, heat alone, alternating between ice & heat.  Experiment to what works for you.  Swelling and bruising can take several weeks to resolve.   °c. It is helpful to take an over-the-counter pain medication regularly for the first few weeks.  Choose one of the following that works best for you: °i. Naproxen (Aleve, etc)  Two 220mg tabs twice a day °ii. Ibuprofen (Advil, etc) Three 200mg tabs four times a day (every meal & bedtime) °iii. Acetaminophen (Tylenol, etc) 500-650mg four times a day (every meal & bedtime) °d. A  prescription for pain medication (such as oxycodone, hydrocodone, etc) should be given to you upon discharge.  Take your pain medication as prescribed.  °i. If you are having problems/concerns with the prescription medicine (does not control pain, nausea, vomiting, rash, itching, etc), please call us [phone] to see if we need to switch you to a different pain medicine that will work better for you and/or control your side effect better. °ii. If you need a refill on your pain medication, please contact your pharmacy.  They will contact our  office to request authorization. Prescriptions will not be filled after 5 pm or on week-ends. °4. Avoid getting constipated.  Between the surgery and the pain medications, it is common to experience some constipation.  Increasing fluid intake and taking a fiber supplement (such as Metamucil, Citrucel, FiberCon, MiraLax, etc) 1-2 times a day regularly will usually help prevent this problem from occurring.  A mild laxative (prune juice, Milk of Magnesia, MiraLax, etc) should be taken according to package directions if there are no bowel movements after 48 hours.   °5. Wash / shower every day.  You may shower over the dressings as they are waterproof.  Continue to shower over incision(s) after the dressing is off. °6. Remove your waterproof bandages 5 days after surgery.  You may leave the incision open to air.  You may have skin tapes (Steri Strips) covering the incision(s).  Leave them on until one week, then remove.  You may replace a dressing/Band-Aid to cover the incision for comfort if you wish.  ° ° ° ° °7. ACTIVITIES as tolerated:   °a. You may resume regular (light) daily activities beginning the next day--such as daily self-care, walking, climbing stairs--gradually increasing activities as tolerated.  If you can walk 30 minutes without difficulty, it is safe to try more intense activity such as jogging, treadmill, bicycling, low-impact aerobics, swimming, etc. °b. Save the most intensive and strenuous activity for last such as sit-ups, heavy lifting, contact sports, etc  Refrain from any heavy lifting or straining until you   are off narcotics for pain control.   c. DO NOT PUSH THROUGH PAIN.  Let pain be your guide: If it hurts to do something, don't do it.  Pain is your body warning you to avoid that activity for another week until the pain goes down. d. You may drive when you are no longer taking prescription pain medication, you can comfortably wear a seatbelt, and you can safely maneuver your car and  apply brakes. e. Torian Bast may have sexual intercourse when it is comfortable.  8. FOLLOW UP in our office a. Please call [facility] at [phone] to set up an appointment to see your surgeon in the office for a follow-up appointment approximately 2-3 weeks after your surgery. b. Make sure that you call for this appointment the day you arrive home to insure a convenient appointment time. 9. IF YOU HAVE DISABILITY OR FAMILY LEAVE FORMS, BRING THEM TO THE OFFICE FOR PROCESSING.  DO NOT GIVE THEM TO YOUR DOCTOR.   WHEN TO CALL us [phone]: 1. Poor pain control 2. Reactions / problems with new medications (rash/itching, nausea, etc)  3. Fever over 101.5 F (38.5 C) 4. Worsening swelling or bruising 5. Continued bleeding from incision. 6. Increased pain, redness, or drainage from the incision 7. Difficulty breathing / swallowing   The clinic staff is available to answer your questions during regular business hours (8:30am-5pm).  Please dont hesitate to call and ask to speak to one of our nurses for clinical concerns.   If you have a medical emergency, go to the nearest emergency room or call 911.  A surgeon from [facility] is always on call at the [facility], [employer], [place], [place], [place]  [address] ? MAIN: [phone] ? TOLL FREE: [phone] ?  FAX [phone] [email or web address]   Post Anesthesia Home Care Instructions  Activity: Get plenty of rest for the remainder of the day. A responsible adult should stay with you for 24 hours following the procedure.  For the next 24 hours, DO NOT: -Drive a car -Paediatric nurse -Drink alcoholic beverages -Take any medication unless instructed by your physician -Make any legal decisions or sign important papers.  Meals: Start with liquid foods such as gelatin or soup. Progress to regular foods as tolerated. Avoid greasy, spicy, heavy foods. If nausea and/or  vomiting occur, drink only clear liquids until the nausea and/or vomiting subsides. Call your physician if vomiting continues.  Special Instructions/Symptoms: Your throat may feel dry or sore from the anesthesia or the breathing tube placed in your throat during surgery. If this causes discomfort, gargle with warm salt water. The discomfort should disappear within 24 hours.

## 2013-11-17 NOTE — Brief Op Note (Signed)
11/17/2013  11:34 AM  PATIENT:  Trevor Zavala  48 y.o. male  PRE-OPERATIVE DIAGNOSIS:  lipoma forehead, cyst on chest  POST-OPERATIVE DIAGNOSIS:  lipoma forehead, cyst on chest  PROCEDURE:  Procedure(s): EXCISION LIPOMA FOREHEAD (N/A) SEBACEOUS CYST CHEST (N/A)  SURGEON:  Surgeon(s) and Role:    * Kaenan Jake A. Kelton Bultman, MD - Primary      ANESTHESIA:   local and general  EBL:  Total I/O In: 1000 [I.V.:1000] Out: -     LOCAL MEDICATIONS USED:  BUPIVICAINE   SPECIMEN:  Source of Specimen:  scalp and left chest  DISPOSITION OF SPECIMEN:  PATHOLOGY  COUNTS:  YES  TOURNIQUET:  * No tourniquets in log *  DICTATION: .Other Dictation: Dictation Number [id number]  PLAN OF CARE: Discharge to home after PACU  PATIENT DISPOSITION:  PACU - hemodynamically stable.   Delay start of Pharmacological VTE agent (>24hrs) due to surgical blood loss or risk of bleeding: not applicable

## 2013-11-18 ENCOUNTER — Encounter (HOSPITAL_BASED_OUTPATIENT_CLINIC_OR_DEPARTMENT_OTHER): Payer: Self-pay | Admitting: Surgery

## 2013-11-18 NOTE — Op Note (Signed)
NAMEBRAYEN, [name]                ACCOUNT NO.:  [id number]  MEDICAL RECORD NO.:  [id number]  LOCATION:                                 FACILITY:  PHYSICIAN:  [name], M.D.     DATE OF BIRTH:  DATE OF PROCEDURE:  11/17/2013 DATE OF DISCHARGE:                              OPERATIVE REPORT   PREOPERATIVE DIAGNOSES: 1. A 1-cm lipoma scalp. 2. A 3-cm left chest epidermal inclusion cyst.  POSTOPERATIVE DIAGNOSES: 1. A 1-cm lipoma scalp. 2. A 3 cm left chest epidermal inclusion cyst.  PROCEDURE: 1. Excision of 1-cm scalp mass consistent with lipoma. 2. Excision of left chest epidermal inclusion cyst.  SURGEON:  [name], M.D.  ANESTHESIA:  LMA with 0.25% Sensorcaine local.  EBL:  Minimal.  SPECIMENS:  As above.  DRAINS:  None.  IV FLUIDS:  400 mL of crystalloid.  INDICATIONS FOR PROCEDURE:  The patient is a 48 year old male with a small what appears to be a lipoma over his anterior scalp.  It had been causing discomfort and he wished to have it removed.  He also has an epidermal inclusion cyst overlying his left chest.  This was infected about 6 weeks ago that has cleared up and he wished to have it removed for definitive treatment.  Risks, benefits, and alternative therapies were discussed with the patient as outlined in my history and physical.  DESCRIPTION OF PROCEDURE:  The patient was met in the holding area and questions answered.  He was then taken back to the operating room and placed supine on the OR table.  After induction of LMA anesthesia, the forehead as well as chest were prepped and draped in sterile fashion. Time-out was done and he received appropriate antibiotic coverage.  The scalp mass was done first.  This transverse incision was made over the 1- 2 cm mass.  This was a lipoma and was removed easily.  The wound was closed with 3-0 Vicryl without difficulty with good hemostasis.  The left chest cyst was encountered next.  It was 2 x  3 cm and quite deep.  Curvilinear incisions were made above and around the cystic remnant.  This was excised all the way down to the chest wall.  We then closed the wound with a deep layer of 2-0 Vicryl and 4-0 Monocryl and subcuticular stitch.  We applied Dermabond to both wounds.  Hemostasis was achieved. All final counts were found to be correct of sponge, needle, and instruments.  The patient was then awoke, taken to recovery in a satisfactory condition.     [name], M.D.     TAC/MEDQ  D:  11/17/2013  T:  11/18/2013  Job:  [id number]

## 2013-12-01 ENCOUNTER — Other Ambulatory Visit: Payer: Self-pay | Admitting: Internal Medicine

## 2013-12-02 ENCOUNTER — Ambulatory Visit (INDEPENDENT_AMBULATORY_CARE_PROVIDER_SITE_OTHER): Payer: PRIVATE HEALTH INSURANCE | Admitting: Surgery

## 2013-12-02 ENCOUNTER — Encounter (INDEPENDENT_AMBULATORY_CARE_PROVIDER_SITE_OTHER): Payer: Self-pay | Admitting: Surgery

## 2013-12-02 VITALS — BP 118/76 | HR 68 | Resp 14 | Ht 71.0 in | Wt 177.2 lb

## 2013-12-02 DIAGNOSIS — Z9889 Other specified postprocedural states: Secondary | ICD-10-CM

## 2013-12-02 NOTE — Patient Instructions (Signed)
RETURN AS NEEDED.  RESUME FULL ACTIVITY.

## 2013-12-02 NOTE — Progress Notes (Signed)
Patient returns after excision of mass from forehead and chest. Final pathology showed a lipoma and remnants of inflamed epidermal inclusion cyst from chest. He is doing well.  Exam: Incisions are clean dry and intact without signs of infection  Impression: Three-week status post excision of mass from forehead and chest  Plan: Resume full activity. Followup as needed.

## 2014-02-03 ENCOUNTER — Encounter: Payer: Self-pay | Admitting: Internal Medicine

## 2014-02-03 ENCOUNTER — Ambulatory Visit (INDEPENDENT_AMBULATORY_CARE_PROVIDER_SITE_OTHER): Payer: PRIVATE HEALTH INSURANCE | Admitting: Internal Medicine

## 2014-02-03 VITALS — BP 150/88 | HR 80 | Temp 98.7°F | Resp 16 | Wt 173.0 lb

## 2014-02-03 DIAGNOSIS — F17200 Nicotine dependence, unspecified, uncomplicated: Secondary | ICD-10-CM

## 2014-02-03 DIAGNOSIS — F4321 Adjustment disorder with depressed mood: Secondary | ICD-10-CM | POA: Insufficient documentation

## 2014-02-03 DIAGNOSIS — L309 Dermatitis, unspecified: Secondary | ICD-10-CM

## 2014-02-03 DIAGNOSIS — L259 Unspecified contact dermatitis, unspecified cause: Secondary | ICD-10-CM

## 2014-02-03 DIAGNOSIS — IMO0001 Reserved for inherently not codable concepts without codable children: Secondary | ICD-10-CM

## 2014-02-03 DIAGNOSIS — R030 Elevated blood-pressure reading, without diagnosis of hypertension: Secondary | ICD-10-CM

## 2014-02-03 MED ORDER — BUPROPION HCL ER (XL) 150 MG PO TB24: 150.0000 mg | ORAL_TABLET | Freq: Every day | ORAL | Status: DC

## 2014-02-03 MED ORDER — CLONAZEPAM 0.25 MG PO TBDP: 0.2500 mg | ORAL_TABLET | Freq: Two times a day (BID) | ORAL | Status: DC | PRN

## 2014-02-03 MED ORDER — ASPIRIN 81 MG PO CHEW: 81.0000 mg | CHEWABLE_TABLET | Freq: Every day | ORAL | Status: DC

## 2014-02-03 NOTE — Progress Notes (Signed)
   Subjective:    HPI  C/o stress and insomnia; depressed. C/o elev BP 120-150  BP is nl at home. C/o R knee pain - Dr Noemi Chapel  The patient presents for a follow-up of  chronic GERD, COPD, controlled with medicines C/o post-nasal drip. He is down to <1 ppd now  BP Readings from Last 3 Encounters:  02/03/14 150/88  12/02/13 118/76  11/17/13 128/76   Wt Readings from Last 3 Encounters:  02/03/14 173 lb (78.472 kg)  12/02/13 177 lb 3.2 oz (80.377 kg)  11/17/13 171 lb 8 oz (77.792 kg)      Review of Systems  Constitutional: Negative for appetite change, fatigue and unexpected weight change.  HENT: Positive for congestion and rhinorrhea. Negative for nosebleeds, sneezing, sore throat and trouble swallowing.   Eyes: Negative for itching and visual disturbance.  Respiratory: Negative for cough.   Cardiovascular: Negative for chest pain, palpitations and leg swelling.  Gastrointestinal: Negative for nausea, diarrhea, blood in stool and abdominal distention.  Genitourinary: Negative for frequency and hematuria.  Musculoskeletal: Negative for back pain, gait problem, joint swelling and neck pain.  Skin: Negative for rash.  Neurological: Negative for dizziness, tremors, speech difficulty and weakness.  Psychiatric/Behavioral: Negative for sleep disturbance, dysphoric mood and agitation. The patient is not nervous/anxious.        Objective:   Physical Exam  Constitutional: He is oriented to person, place, and time. He appears well-developed.  HENT:  Mouth/Throat: Oropharynx is clear and moist.  Eyes: Conjunctivae are normal. Pupils are equal, round, and reactive to light.  Neck: Normal range of motion. No JVD present. No thyromegaly present.  Cardiovascular: Normal rate, regular rhythm, normal heart sounds and intact distal pulses.  Exam reveals no gallop and no friction rub.   No murmur heard. Pulmonary/Chest: Effort normal and breath sounds normal. No respiratory distress. He  has no wheezes. He has no rales. He exhibits no tenderness.  Abdominal: Soft. Bowel sounds are normal. He exhibits no distension and no mass. There is no tenderness. There is no rebound and no guarding.  Musculoskeletal: Normal range of motion. He exhibits no edema and no tenderness.  Lymphadenopathy:    He has no cervical adenopathy.  Neurological: He is alert and oriented to person, place, and time. He has normal reflexes. No cranial nerve deficit. He exhibits normal muscle tone. Coordination normal.  Skin: Skin is warm and dry. No rash noted.  Psychiatric: He has a normal mood and affect. His behavior is normal. Judgment and thought content normal.  Dry skin on fingers, cracks  Lab Results  Component Value Date   WBC 7.2 10/07/2013   HGB 16.4 11/17/2013   HCT 47.2 10/07/2013   PLT 193.0 10/07/2013   GLUCOSE 106* 10/07/2013   CHOL 174 08/19/2010   TRIG 249.0* 08/19/2010   HDL 43.60 08/19/2010   LDLDIRECT 91.6 08/19/2010   LDLCALC 85 03/29/2007   ALT 15 10/07/2013   AST 17 10/07/2013   NA 138 10/07/2013   K 4.0 10/07/2013   CL 103 10/07/2013   CREATININE 0.7 10/07/2013   BUN 12 10/07/2013   CO2 30 10/07/2013   TSH 1.05 10/07/2013   PSA 1.42 03/29/2007              Assessment & Plan:

## 2014-02-03 NOTE — Progress Notes (Signed)
Pre visit review using our clinic review tool, if applicable. No additional management support is needed unless otherwise documented below in the visit note. 

## 2014-02-03 NOTE — Assessment & Plan Note (Signed)
Stop smoking discussed

## 2014-02-03 NOTE — Assessment & Plan Note (Signed)
Triamc helps

## 2014-02-03 NOTE — Assessment & Plan Note (Signed)
Start Wellbutrin XL Clonazepam prn

## 2014-02-03 NOTE — Assessment & Plan Note (Signed)
NAS diet 

## 2014-02-06 ENCOUNTER — Telehealth: Payer: Self-pay | Admitting: Internal Medicine

## 2014-02-06 NOTE — Telephone Encounter (Signed)
Relevant patient education assigned to patient using Emmi. ° °

## 2014-03-27 ENCOUNTER — Other Ambulatory Visit: Payer: Self-pay | Admitting: Internal Medicine

## 2014-03-28 ENCOUNTER — Other Ambulatory Visit: Payer: Self-pay | Admitting: *Deleted

## 2014-03-28 MED ORDER — BUPROPION HCL ER (XL) 150 MG PO TB24: 150.0000 mg | ORAL_TABLET | Freq: Every day | ORAL | Status: DC

## 2014-03-29 NOTE — Telephone Encounter (Signed)
Called refill into pharmacy spoke with Mia gave md approval.../lmb

## 2014-04-11 ENCOUNTER — Other Ambulatory Visit: Payer: Self-pay | Admitting: *Deleted

## 2014-04-11 MED ORDER — MOMETASONE FURO-FORMOTEROL FUM 200-5 MCG/ACT IN AERO: 2.0000 | INHALATION_SPRAY | Freq: Two times a day (BID) | RESPIRATORY_TRACT | Status: DC

## 2014-04-21 ENCOUNTER — Ambulatory Visit: Payer: PRIVATE HEALTH INSURANCE | Admitting: Internal Medicine

## 2014-06-15 ENCOUNTER — Other Ambulatory Visit: Payer: Self-pay | Admitting: Internal Medicine

## 2014-06-16 NOTE — Telephone Encounter (Signed)
Notified pharmacy spoke with Hurley gave md approval.../lmb

## 2014-07-07 ENCOUNTER — Other Ambulatory Visit: Payer: Self-pay | Admitting: *Deleted

## 2014-07-07 MED ORDER — MOMETASONE FURO-FORMOTEROL FUM 200-5 MCG/ACT IN AERO: 2.0000 | INHALATION_SPRAY | Freq: Two times a day (BID) | RESPIRATORY_TRACT | Status: DC

## 2014-07-28 ENCOUNTER — Other Ambulatory Visit: Payer: Self-pay

## 2014-07-28 MED ORDER — ESOMEPRAZOLE MAGNESIUM 40 MG PO CPDR: 40.0000 mg | DELAYED_RELEASE_CAPSULE | Freq: Every day | ORAL | Status: DC

## 2014-08-04 ENCOUNTER — Other Ambulatory Visit: Payer: Self-pay | Admitting: Internal Medicine

## 2014-08-04 NOTE — Telephone Encounter (Signed)
Called pt to verify msg below. Pt is needing his clonazepam sent to [facility] Delivery 90 day supply is recommended by insurance. Inform pt will send msg to md once he respond with approval will give him a call back with status...Trevor Zavala

## 2014-08-04 NOTE — Telephone Encounter (Signed)
Pt called in said that he switched ins companies and they require 90 supply.  It is now mail order. They are going to mail it to him but will take 5 to 20 days and he stated that he is completely out.  Ins company told him that they would authorize a few day supply if dr would give him a few til mail order came in.     Thank you!!

## 2014-08-07 NOTE — Telephone Encounter (Signed)
Pt called back requesting status on refill. MD is out this week pls advise on refill. Pls send back to [place]...Johny Chess

## 2014-08-07 NOTE — Telephone Encounter (Signed)
90d rx authorized, no refills - covering in absence of usual PCP Please print and i will sign

## 2014-08-08 MED ORDER — CLONAZEPAM 0.25 MG PO TBDP: ORAL_TABLET | ORAL | Status: DC

## 2014-08-08 NOTE — Telephone Encounter (Signed)
Reprinted script system was down...Trevor Zavala

## 2014-08-08 NOTE — Telephone Encounter (Signed)
Called pt no answer LMOM rx sent to [place] home delivery...Trevor Zavala

## 2014-08-25 ENCOUNTER — Other Ambulatory Visit: Payer: Self-pay

## 2014-10-09 ENCOUNTER — Encounter: Payer: PRIVATE HEALTH INSURANCE | Admitting: Internal Medicine

## 2014-11-23 ENCOUNTER — Other Ambulatory Visit (INDEPENDENT_AMBULATORY_CARE_PROVIDER_SITE_OTHER): Payer: Managed Care, Other (non HMO)

## 2014-11-23 ENCOUNTER — Encounter: Payer: Self-pay | Admitting: Internal Medicine

## 2014-11-23 ENCOUNTER — Ambulatory Visit (INDEPENDENT_AMBULATORY_CARE_PROVIDER_SITE_OTHER)
Admission: RE | Admit: 2014-11-23 | Discharge: 2014-11-23 | Disposition: A | Discharge status: Home / Self Care | Payer: Managed Care, Other (non HMO) | Source: Ambulatory Visit | Attending: Internal Medicine | Admitting: Internal Medicine

## 2014-11-23 ENCOUNTER — Ambulatory Visit (INDEPENDENT_AMBULATORY_CARE_PROVIDER_SITE_OTHER): Payer: Managed Care, Other (non HMO) | Admitting: Internal Medicine

## 2014-11-23 VITALS — BP 130/80 | HR 82 | Temp 98.4°F | Ht 71.0 in | Wt 176.0 lb

## 2014-11-23 DIAGNOSIS — Z0000 Encounter for general adult medical examination without abnormal findings: Secondary | ICD-10-CM

## 2014-11-23 DIAGNOSIS — Z720 Tobacco use: Secondary | ICD-10-CM

## 2014-11-23 DIAGNOSIS — R05 Cough: Secondary | ICD-10-CM

## 2014-11-23 DIAGNOSIS — R059 Cough, unspecified: Secondary | ICD-10-CM

## 2014-11-23 DIAGNOSIS — F17200 Nicotine dependence, unspecified, uncomplicated: Secondary | ICD-10-CM

## 2014-11-23 DIAGNOSIS — Z1211 Encounter for screening for malignant neoplasm of colon: Secondary | ICD-10-CM

## 2014-11-23 LAB — CBC WITH DIFFERENTIAL/PLATELET
BASOS PCT: 0.3 % (ref 0.0–3.0)
Basophils Absolute: 0 10*3/uL (ref 0.0–0.1)
EOS ABS: 0.1 10*3/uL (ref 0.0–0.7)
Eosinophils Relative: 0.6 % (ref 0.0–5.0)
HCT: 49.6 % (ref 39.0–52.0)
Hemoglobin: 16.8 g/dL (ref 13.0–17.0)
Lymphocytes Relative: 17.2 % (ref 12.0–46.0)
Lymphs Abs: 1.7 10*3/uL (ref 0.7–4.0)
MCHC: 33.8 g/dL (ref 30.0–36.0)
MCV: 96.1 fl (ref 78.0–100.0)
Monocytes Absolute: 0.6 10*3/uL (ref 0.1–1.0)
Monocytes Relative: 6.6 % (ref 3.0–12.0)
NEUTROS ABS: 7.4 10*3/uL (ref 1.4–7.7)
Neutrophils Relative %: 75.3 % (ref 43.0–77.0)
PLATELETS: 213 10*3/uL (ref 150.0–400.0)
RBC: 5.16 Mil/uL (ref 4.22–5.81)
RDW: 12.3 % (ref 11.5–15.5)
WBC: 9.8 10*3/uL (ref 4.0–10.5)

## 2014-11-23 LAB — HEPATIC FUNCTION PANEL
ALK PHOS: 81 U/L (ref 39–117)
ALT: 17 U/L (ref 0–53)
AST: 17 U/L (ref 0–37)
Albumin: 4.3 g/dL (ref 3.5–5.2)
BILIRUBIN TOTAL: 0.6 mg/dL (ref 0.2–1.2)
Bilirubin, Direct: 0.2 mg/dL (ref 0.0–0.3)
Total Protein: 6.6 g/dL (ref 6.0–8.3)

## 2014-11-23 LAB — BASIC METABOLIC PANEL
BUN: 11 mg/dL (ref 6–23)
CALCIUM: 9.6 mg/dL (ref 8.4–10.5)
CO2: 31 mEq/L (ref 19–32)
Chloride: 105 mEq/L (ref 96–112)
Creatinine, Ser: 0.71 mg/dL (ref 0.40–1.50)
GFR: 124.74 mL/min (ref >60.00)
Glucose, Bld: 99 mg/dL (ref 70–99)
POTASSIUM: 4.7 meq/L (ref 3.5–5.1)
Sodium: 139 mEq/L (ref 135–145)

## 2014-11-23 LAB — URINALYSIS
Bilirubin Urine: NEGATIVE
HGB URINE DIPSTICK: NEGATIVE
Ketones, ur: NEGATIVE
Leukocytes, UA: NEGATIVE
NITRITE: NEGATIVE
Specific Gravity, Urine: 1.02 (ref 1.000–1.030)
Total Protein, Urine: NEGATIVE
URINE GLUCOSE: NEGATIVE
Urobilinogen, UA: 0.2 (ref 0.0–1.0)
pH: 7 (ref 5.0–8.0)

## 2014-11-23 LAB — TSH: TSH: 0.64 u[IU]/mL (ref 0.35–4.50)

## 2014-11-23 LAB — LIPID PANEL
CHOLESTEROL: 174 mg/dL (ref 0–200)
HDL: 52.5 mg/dL (ref >39.00)
LDL CALC: 103 mg/dL — AB (ref 0–99)
NonHDL: 121.5
TRIGLYCERIDES: 93 mg/dL (ref 0.0–149.0)
Total CHOL/HDL Ratio: 3
VLDL: 18.6 mg/dL (ref 0.0–40.0)

## 2014-11-23 LAB — PSA: PSA: 1.24 ng/mL (ref 0.10–4.00)

## 2014-11-23 MED ORDER — VITAMIN D 1000 UNITS PO TABS: 1000.0000 [IU] | ORAL_TABLET | Freq: Every day | ORAL | Status: AC

## 2014-11-23 NOTE — Progress Notes (Signed)
Pre visit review using our clinic review tool, if applicable. No additional management support is needed unless otherwise documented below in the visit note. 

## 2014-11-23 NOTE — Progress Notes (Signed)
  Subjective:    HPI  The patient is here for a wellness exam. The patient has been doing well overall without major physical or psychological issues going on lately. BP is nl at home.   The patient presents for a follow-up of  chronic GERD, COPD, controlled with medicines  He is down to <1 ppd now  BP Readings from Last 3 Encounters:  11/23/14 130/80  02/03/14 150/88  12/02/13 118/76   Wt Readings from Last 3 Encounters:  11/23/14 176 lb (79.833 kg)  02/03/14 173 lb (78.472 kg)  12/02/13 177 lb 3.2 oz (80.377 kg)      Review of Systems  Constitutional: Negative for appetite change, fatigue and unexpected weight change.  HENT: Positive for congestion and rhinorrhea. Negative for nosebleeds, sneezing, sore throat and trouble swallowing.   Eyes: Negative for itching and visual disturbance.  Respiratory: Negative for cough.   Cardiovascular: Negative for chest pain, palpitations and leg swelling.  Gastrointestinal: Negative for nausea, diarrhea, blood in stool and abdominal distention.  Genitourinary: Negative for frequency and hematuria.  Musculoskeletal: Negative for back pain, joint swelling, gait problem and neck pain.  Skin: Negative for rash.  Neurological: Negative for dizziness, tremors, speech difficulty and weakness.  Psychiatric/Behavioral: Negative for sleep disturbance, dysphoric mood and agitation. The patient is not nervous/anxious.        Objective:   Physical Exam  Constitutional: He is oriented to person, place, and time. He appears well-developed.  HENT:  Mouth/Throat: Oropharynx is clear and moist.  Eyes: Conjunctivae are normal. Pupils are equal, round, and reactive to light.  Neck: Normal range of motion. No JVD present. No thyromegaly present.  Cardiovascular: Normal rate, regular rhythm, normal heart sounds and intact distal pulses.  Exam reveals no gallop and no friction rub.   No murmur heard. Pulmonary/Chest: Effort normal and breath sounds  normal. No respiratory distress. He has no wheezes. He has no rales. He exhibits no tenderness.  Abdominal: Soft. Bowel sounds are normal. He exhibits no distension and no mass. There is no tenderness. There is no rebound and no guarding.  Musculoskeletal: Normal range of motion. He exhibits no edema or tenderness.  Lymphadenopathy:    He has no cervical adenopathy.  Neurological: He is alert and oriented to person, place, and time. He has normal reflexes. No cranial nerve deficit. He exhibits normal muscle tone. Coordination normal.  Skin: Skin is warm and dry. No rash noted.  Psychiatric: He has a normal mood and affect. His behavior is normal. Judgment and thought content normal.  Dry skin on fingers Rectal deffered to GI  Lab Results  Component Value Date   WBC 7.2 10/07/2013   HGB 16.4 11/17/2013   HCT 47.2 10/07/2013   PLT 193.0 10/07/2013   GLUCOSE 106* 10/07/2013   CHOL 174 08/19/2010   TRIG 249.0* 08/19/2010   HDL 43.60 08/19/2010   LDLDIRECT 91.6 08/19/2010   LDLCALC 85 03/29/2007   ALT 15 10/07/2013   AST 17 10/07/2013   NA 138 10/07/2013   K 4.0 10/07/2013   CL 103 10/07/2013   CREATININE 0.7 10/07/2013   BUN 12 10/07/2013   CO2 30 10/07/2013   TSH 1.05 10/07/2013   PSA 1.42 03/29/2007    EKG          Assessment & Plan:

## 2014-11-23 NOTE — Assessment & Plan Note (Addendum)
We discussed age appropriate health related issues, including available/recomended screening tests and vaccinations. We discussed a need for adhering to healthy diet and exercise. Labs/EKG were reviewed/ordered. All questions were answered. Colonoscopy

## 2014-11-23 NOTE — Assessment & Plan Note (Signed)
Discussed.

## 2014-11-24 ENCOUNTER — Encounter: Payer: Self-pay | Admitting: Gastroenterology

## 2014-12-06 ENCOUNTER — Telehealth: Payer: Self-pay | Admitting: *Deleted

## 2014-12-06 NOTE — Telephone Encounter (Signed)
Rf req for Clonazepam ODT  0.25 mg # 180. Ok to Rf?

## 2014-12-07 MED ORDER — CLONAZEPAM 0.25 MG PO TBDP: ORAL_TABLET | ORAL | Status: DC

## 2014-12-07 NOTE — Telephone Encounter (Signed)
Script printed and waiting for MD sig

## 2014-12-07 NOTE — Telephone Encounter (Signed)
[facility] in [place]  Pt called back and this is where it should be sent to

## 2014-12-07 NOTE — Telephone Encounter (Signed)
Phone call to patient and left a message for him to call back and let us know what pharmacy Clonazepam should be sent to.

## 2014-12-07 NOTE — Telephone Encounter (Signed)
Faxed script back to [place] home delivery...Trevor Zavala

## 2014-12-07 NOTE — Telephone Encounter (Signed)
Ok to ref, +1 ref Thx

## 2014-12-07 NOTE — Telephone Encounter (Signed)
Disregard, message already taken care of

## 2015-01-09 DIAGNOSIS — D126 Benign neoplasm of colon, unspecified: Secondary | ICD-10-CM

## 2015-01-09 HISTORY — DX: Benign neoplasm of colon, unspecified: D12.6

## 2015-01-12 ENCOUNTER — Ambulatory Visit (AMBULATORY_SURGERY_CENTER): Payer: Self-pay

## 2015-01-12 VITALS — Ht 71.0 in | Wt 175.0 lb

## 2015-01-12 DIAGNOSIS — Z800 Family history of malignant neoplasm of digestive organs: Secondary | ICD-10-CM

## 2015-01-12 MED ORDER — MOVIPREP 100 G PO SOLR
1.0000 | Freq: Once | ORAL | Status: DC
Start: 2015-01-12 — End: 2015-01-26

## 2015-01-12 NOTE — Progress Notes (Signed)
No allergies to eggs or soy No diet/weight loss meds No home oxygen No past problems with anesthesia  Has email  Emmi instructions given for colonoscopy 

## 2015-01-24 ENCOUNTER — Telehealth: Payer: Self-pay | Admitting: Gastroenterology

## 2015-01-24 DIAGNOSIS — Z1211 Encounter for screening for malignant neoplasm of colon: Secondary | ICD-10-CM

## 2015-01-24 MED ORDER — MOVIPREP 100 G PO SOLR: 1.0000 | Freq: Once | ORAL | Status: DC

## 2015-01-24 NOTE — Telephone Encounter (Signed)
Sent RX for Moviprep to [facility] in [place].  Levada Dy PV

## 2015-01-26 ENCOUNTER — Encounter: Payer: Self-pay | Admitting: Gastroenterology

## 2015-01-26 ENCOUNTER — Ambulatory Visit (AMBULATORY_SURGERY_CENTER): Payer: Managed Care, Other (non HMO) | Admitting: Gastroenterology

## 2015-01-26 VITALS — BP 144/85 | HR 61 | Temp 98.5°F | Resp 18 | Ht 71.0 in | Wt 176.0 lb

## 2015-01-26 DIAGNOSIS — D122 Benign neoplasm of ascending colon: Secondary | ICD-10-CM

## 2015-01-26 DIAGNOSIS — D120 Benign neoplasm of cecum: Secondary | ICD-10-CM

## 2015-01-26 DIAGNOSIS — D125 Benign neoplasm of sigmoid colon: Secondary | ICD-10-CM

## 2015-01-26 DIAGNOSIS — Z800 Family history of malignant neoplasm of digestive organs: Secondary | ICD-10-CM

## 2015-01-26 DIAGNOSIS — Z1211 Encounter for screening for malignant neoplasm of colon: Secondary | ICD-10-CM

## 2015-01-26 DIAGNOSIS — D123 Benign neoplasm of transverse colon: Secondary | ICD-10-CM

## 2015-01-26 MED ORDER — SODIUM CHLORIDE 0.9 % IV SOLN: 500.0000 mL | INTRAVENOUS | Status: DC

## 2015-01-26 NOTE — Progress Notes (Signed)
Called to room to assist during endoscopic procedure.  Patient ID and intended procedure confirmed with present staff. Received instructions for my participation in the procedure from the performing physician.  

## 2015-01-26 NOTE — Op Note (Signed)
[facility]  [address] [place], [id number]   COLONOSCOPY PROCEDURE REPORT  PATIENT: Trevor Zavala, Trevor Zavala  MR#: [id number] BIRTHDATE: [birth year] , 48  yrs. old GENDER: male ENDOSCOPIST: Ladene Artist, MD, [facility] REFERRED JA:SNKN Avel Sensor, M.D. PROCEDURE DATE:  01/26/2015 PROCEDURE:   Colonoscopy, screening and Colonoscopy with snare polypectomy First Screening Colonoscopy - Avg.  risk and is 50 yrs.  old or older Yes.  Prior Negative Screening - Now for repeat screening. N/A  History of Adenoma - Now for follow-up colonoscopy & has been > or = to 3 yrs.  N/A ASA CLASS:   Class II INDICATIONS:Screening for colonic neoplasia and FH Colon or Rectal Adenocarcinoma. MEDICATIONS: Monitored anesthesia care, Propofol 350 mg IV, and lidocaine 40 mg IV DESCRIPTION OF PROCEDURE:   After the risks benefits and alternatives of the procedure were thoroughly explained, informed consent was obtained.  The digital rectal exam revealed no abnormalities of the rectum.   The LB [id number]  endoscope was introduced through the anus and advanced to the cecum, which was identified by both the appendix and ileocecal valve. No adverse events experienced.   The quality of the prep was excellent. (MoviPrep was used)  The instrument was then slowly withdrawn as the colon was fully examined.  COLON FINDINGS: Two sessile polyps measuring 10 mm in size were found in the transverse colon and at the cecum.  A polypectomy was performed.  Polypectomies were performed using snare cautery.  The resection was complete, the polyp tissue was completely retrieved and sent to histology.   Two sessile polyps measuring 7 mm in size were found in the transverse colon and ascending colon. Polypectomies were performed with a cold snare.  The resection was complete, the polyp tissue was completely retrieved and sent to histology.   A pedunculated polyp measuring 15 mm in size was found in the sigmoid  colon.  A polypectomy was performed using snare cautery.  The resection was complete, the polyp tissue was completely retrieved and sent to histology.   The colonic mucosa appeared normal.  Retroflexed views revealed no abnormalities. The time to cecum = 0.4 Withdrawal time = 15.3   The scope was withdrawn and the procedure completed. COMPLICATIONS: There were no immediate complications.  ENDOSCOPIC IMPRESSION: 1.   Two sessile polyps in the transverse colon and at the cecum; polypectomies performed using snare cautery 2.   Two sessile polyps in the transverse colon and ascending colon; polypectomies performed with a cold snare 3.   Pedunculated polyp in the sigmoid colon; polypectomy performed using snare cautery  RECOMMENDATIONS: 1.  Hold Aspirin and all other NSAIDS for 2 weeks. 2.  Await pathology results 3.  Repeat colonoscopy in 2-3 years if polyp(s) adenomatous; otherwise 5 years pending pathology review  eSigned:  Ladene Artist, MD, [facility] 01/26/2015 10:30 AM

## 2015-01-26 NOTE — Patient Instructions (Signed)
YOU HAD AN ENDOSCOPIC PROCEDURE TODAY AT [facility]:   Refer to the procedure report that was given to you for any specific questions about what was found during the examination.  If the procedure report does not answer your questions, please call your gastroenterologist to clarify.  If you requested that your care partner not be given the details of your procedure findings, then the procedure report has been included in a sealed envelope for you to review at your convenience later.  YOU SHOULD EXPECT: Some feelings of bloating in the abdomen. Passage of more gas than usual.  Walking can help get rid of the air that was put into your GI tract during the procedure and reduce the bloating. If you had a lower endoscopy (such as a colonoscopy or flexible sigmoidoscopy) you may notice spotting of blood in your stool or on the toilet paper. If you underwent a bowel prep for your procedure, you may not have a normal bowel movement for a few days.  Please Note:  You might notice some irritation and congestion in your nose or some drainage.  This is from the oxygen used during your procedure.  There is no need for concern and it should clear up in a day or so.  SYMPTOMS TO REPORT IMMEDIATELY:   Following lower endoscopy (colonoscopy or flexible sigmoidoscopy):  Excessive amounts of blood in the stool  Significant tenderness or worsening of abdominal pains  Swelling of the abdomen that is new, acute  Fever of 100F or higher  For urgent or emergent issues, a gastroenterologist can be reached at any hour by calling [phone].   DIET: Your first meal following the procedure should be a small meal and then it is ok to progress to your normal diet. Heavy or fried foods are harder to digest and may make you feel nauseous or bloated.  Likewise, meals heavy in dairy and vegetables can increase bloating.  Drink plenty of fluids but you should avoid alcoholic beverages for 24  hours.  ACTIVITY:  You should plan to take it easy for the rest of today and you should NOT DRIVE or use heavy machinery until tomorrow (because of the sedation medicines used during the test).    FOLLOW UP: Our staff will call the number listed on your records the next business day following your procedure to check on you and address any questions or concerns that you may have regarding the information given to you following your procedure. If we do not reach you, we will leave a message.  However, if you are feeling well and you are not experiencing any problems, there is no need to return our call.  We will assume that you have returned to your regular daily activities without incident.  If any biopsies were taken you will be contacted by phone or by letter within the next 1-3 weeks.  Please call us at [phone] if you have not heard about the biopsies in 3 weeks.    SIGNATURES/CONFIDENTIALITY: You and/or your care partner have signed paperwork which will be entered into your electronic medical record.  These signatures attest to the fact that that the information above on your After Visit Summary has been reviewed and is understood.  Full responsibility of the confidentiality of this discharge information lies with you and/or your care-partner.  Polyps-handout given  Hold aspirin, aspirin products and anti-inflammatory medications(ibuprofen, motrin, advil, naproxen, aleve, mobic, etc) for 2 weeks or until February 09, 2015.  Repeat colonoscopy will be determined by pathology.

## 2015-01-26 NOTE — Progress Notes (Signed)
Stable to RR 

## 2015-01-29 ENCOUNTER — Telehealth: Payer: Self-pay | Admitting: *Deleted

## 2015-01-29 NOTE — Telephone Encounter (Signed)
  Follow up Call-  Call back number 01/26/2015  Post procedure Call Back phone  # [phone]  Permission to leave phone message Yes     No answer, left message.

## 2015-01-31 ENCOUNTER — Encounter: Payer: Self-pay | Admitting: Gastroenterology

## 2015-02-26 ENCOUNTER — Other Ambulatory Visit: Payer: Self-pay | Admitting: Internal Medicine

## 2015-04-18 ENCOUNTER — Other Ambulatory Visit: Payer: Self-pay | Admitting: Internal Medicine

## 2015-04-26 ENCOUNTER — Telehealth: Payer: Self-pay | Admitting: Internal Medicine

## 2015-04-26 MED ORDER — MOMETASONE FURO-FORMOTEROL FUM 200-5 MCG/ACT IN AERO: INHALATION_SPRAY | RESPIRATORY_TRACT | Status: DC

## 2015-04-26 NOTE — Telephone Encounter (Signed)
Resent Dulera to mail service...Trevor Zavala

## 2015-04-26 NOTE — Telephone Encounter (Signed)
Patient called and [facility]av[facility] never received the prescription for DULERA 200-5 MCG/ACT AERO [[id number]]. Can you please send that back

## 2015-05-08 ENCOUNTER — Ambulatory Visit: Payer: Managed Care, Other (non HMO) | Admitting: Internal Medicine

## 2015-06-22 ENCOUNTER — Encounter: Payer: Self-pay | Admitting: Internal Medicine

## 2015-06-22 ENCOUNTER — Ambulatory Visit (INDEPENDENT_AMBULATORY_CARE_PROVIDER_SITE_OTHER): Payer: Managed Care, Other (non HMO) | Admitting: Internal Medicine

## 2015-06-22 VITALS — BP 148/94 | HR 78 | Wt 173.0 lb

## 2015-06-22 DIAGNOSIS — K2100 Gastro-esophageal reflux disease with esophagitis, without bleeding: Secondary | ICD-10-CM

## 2015-06-22 DIAGNOSIS — J449 Chronic obstructive pulmonary disease, unspecified: Secondary | ICD-10-CM

## 2015-06-22 DIAGNOSIS — F411 Generalized anxiety disorder: Secondary | ICD-10-CM | POA: Insufficient documentation

## 2015-06-22 DIAGNOSIS — R030 Elevated blood-pressure reading, without diagnosis of hypertension: Secondary | ICD-10-CM | POA: Diagnosis not present

## 2015-06-22 DIAGNOSIS — IMO0001 Reserved for inherently not codable concepts without codable children: Secondary | ICD-10-CM

## 2015-06-22 MED ORDER — CLONAZEPAM 0.5 MG PO TABS: 0.5000 mg | ORAL_TABLET | Freq: Two times a day (BID) | ORAL | Status: DC | PRN

## 2015-06-22 NOTE — Progress Notes (Signed)
Pre visit review using our clinic review tool, if applicable. No additional management support is needed unless otherwise documented below in the visit note. 

## 2015-06-22 NOTE — Progress Notes (Signed)
  Subjective:    HPI  F/u elev BP.  BP is nl at home.   The patient presents for a follow-up of  chronic GERD, COPD, anxiety controlled with medicines He is smoking 1 ppd now  BP Readings from Last 3 Encounters:  06/22/15 148/94  01/26/15 144/85  11/23/14 130/80   Wt Readings from Last 3 Encounters:  06/22/15 173 lb (78.472 kg)  01/26/15 176 lb (79.833 kg)  01/12/15 175 lb (79.379 kg)      Review of Systems  Constitutional: Negative for appetite change, fatigue and unexpected weight change.  HENT: Positive for congestion and rhinorrhea. Negative for nosebleeds, sneezing, sore throat and trouble swallowing.   Eyes: Negative for itching and visual disturbance.  Respiratory: Negative for cough.   Cardiovascular: Negative for chest pain, palpitations and leg swelling.  Gastrointestinal: Negative for nausea, diarrhea, blood in stool and abdominal distention.  Genitourinary: Negative for frequency and hematuria.  Musculoskeletal: Negative for back pain, joint swelling, gait problem and neck pain.  Skin: Negative for rash.  Neurological: Negative for dizziness, tremors, speech difficulty and weakness.  Psychiatric/Behavioral: Negative for sleep disturbance, dysphoric mood and agitation. The patient is not nervous/anxious.        Objective:   Physical Exam  Constitutional: He is oriented to person, place, and time. He appears well-developed.  HENT:  Mouth/Throat: Oropharynx is clear and moist.  Eyes: Conjunctivae are normal. Pupils are equal, round, and reactive to light.  Neck: Normal range of motion. No JVD present. No thyromegaly present.  Cardiovascular: Normal rate, regular rhythm, normal heart sounds and intact distal pulses.  Exam reveals no gallop and no friction rub.   No murmur heard. Pulmonary/Chest: Effort normal and breath sounds normal. No respiratory distress. He has no wheezes. He has no rales. He exhibits no tenderness.  Abdominal: Soft. Bowel sounds are  normal. He exhibits no distension and no mass. There is no tenderness. There is no rebound and no guarding.  Musculoskeletal: Normal range of motion. He exhibits no edema or tenderness.  Lymphadenopathy:    He has no cervical adenopathy.  Neurological: He is alert and oriented to person, place, and time. He has normal reflexes. No cranial nerve deficit. He exhibits normal muscle tone. Coordination normal.  Skin: Skin is warm and dry. No rash noted.  Psychiatric: He has a normal mood and affect. His behavior is normal. Judgment and thought content normal.  Dry skin on fingers   Lab Results  Component Value Date   WBC 9.8 11/23/2014   HGB 16.8 11/23/2014   HCT 49.6 11/23/2014   PLT 213.0 11/23/2014   GLUCOSE 99 11/23/2014   CHOL 174 11/23/2014   TRIG 93.0 11/23/2014   HDL 52.50 11/23/2014   LDLDIRECT 91.6 08/19/2010   LDLCALC 103* 11/23/2014   ALT 17 11/23/2014   AST 17 11/23/2014   NA 139 11/23/2014   K 4.7 11/23/2014   CL 105 11/23/2014   CREATININE 0.71 11/23/2014   BUN 11 11/23/2014   CO2 31 11/23/2014   TSH 0.64 11/23/2014   PSA 1.24 11/23/2014              Assessment & Plan:

## 2015-06-22 NOTE — Assessment & Plan Note (Signed)
In a smoker - discussed Momet-formet

## 2015-06-22 NOTE — Assessment & Plan Note (Signed)
Worse Clonazepam prn - will increase dose  Potential benefits of a long term benzodiazepines  use as well as potential risks  and complications were explained to the patient and were aknowledged.

## 2015-06-22 NOTE — Assessment & Plan Note (Signed)
Nexium 

## 2015-06-22 NOTE — Assessment & Plan Note (Signed)
BP nl at home per pt

## 2015-07-17 ENCOUNTER — Other Ambulatory Visit: Payer: Self-pay | Admitting: *Deleted

## 2015-07-17 MED ORDER — ACYCLOVIR 400 MG PO TABS: 400.0000 mg | ORAL_TABLET | Freq: Three times a day (TID) | ORAL | Status: DC

## 2015-12-28 ENCOUNTER — Ambulatory Visit (INDEPENDENT_AMBULATORY_CARE_PROVIDER_SITE_OTHER): Payer: Managed Care, Other (non HMO) | Admitting: Internal Medicine

## 2015-12-28 ENCOUNTER — Encounter: Payer: Self-pay | Admitting: Internal Medicine

## 2015-12-28 VITALS — BP 140/98 | HR 89 | Wt 175.0 lb

## 2015-12-28 DIAGNOSIS — F411 Generalized anxiety disorder: Secondary | ICD-10-CM | POA: Diagnosis not present

## 2015-12-28 DIAGNOSIS — K2100 Gastro-esophageal reflux disease with esophagitis, without bleeding: Secondary | ICD-10-CM

## 2015-12-28 DIAGNOSIS — J449 Chronic obstructive pulmonary disease, unspecified: Secondary | ICD-10-CM

## 2015-12-28 DIAGNOSIS — F17200 Nicotine dependence, unspecified, uncomplicated: Secondary | ICD-10-CM | POA: Diagnosis not present

## 2015-12-28 MED ORDER — FLUTICASONE FUROATE-VILANTEROL 100-25 MCG/INH IN AEPB: 1.0000 | INHALATION_SPRAY | Freq: Every day | RESPIRATORY_TRACT | Status: DC

## 2015-12-28 NOTE — Progress Notes (Signed)
Pre visit review using our clinic review tool, if applicable. No additional management support is needed unless otherwise documented below in the visit note. 

## 2015-12-28 NOTE — Assessment & Plan Note (Signed)
Clonazepam prn  Potential benefits of a long term benzodiazepines  use as well as potential risks  and complications were explained to the patient and were aknowledged.  

## 2015-12-28 NOTE — Assessment & Plan Note (Signed)
2/17  Pt is on 4 cigs a day and a vapor

## 2015-12-28 NOTE — Assessment & Plan Note (Signed)
Nexium 

## 2015-12-28 NOTE — Progress Notes (Signed)
Subjective:  Patient ID: Trevor Zavala, male    DOB: Jan 04, 1966  Age: 50 y.o. MRN: [id number]  CC: No chief complaint on file.   HPI AIZEN DUVAL presents for COPD, anxiety, GERD f/u. Pt is on 4 cigs a day and a vapor. Ruthe Mannan is too $$. BP is nl at home  Outpatient Prescriptions Prior to Visit  Medication Sig Dispense Refill  . aspirin (ASPIRIN CHILDRENS) 81 MG chewable tablet Chew 1 tablet (81 mg total) by mouth daily. 100 tablet 11  . clonazePAM (KLONOPIN) 0.5 MG tablet Take 1 tablet (0.5 mg total) by mouth 2 (two) times daily as needed for anxiety. 180 tablet 5  . esomeprazole (NEXIUM) 40 MG capsule TAKE 1 CAPSULE BY MOUTH DAILY BEFORE BREAKFAST 90 capsule 3  . guaiFENesin-codeine 100-10 MG/5ML syrup     . mometasone-formoterol (DULERA) 200-5 MCG/ACT AERO INHALE 2 PUFFS ORALLY TWICE DAILY 3 Inhaler 3  . triamcinolone ointment (KENALOG) 0.5 % Apply 1 application topically 2 (two) times daily. 60 g 3   No facility-administered medications prior to visit.    ROS Review of Systems  Constitutional: Negative for appetite change, fatigue and unexpected weight change.  HENT: Negative for congestion, nosebleeds, sneezing, sore throat and trouble swallowing.   Eyes: Negative for itching and visual disturbance.  Respiratory: Positive for cough and wheezing.   Cardiovascular: Negative for chest pain, palpitations and leg swelling.  Gastrointestinal: Negative for nausea, diarrhea, blood in stool and abdominal distention.  Genitourinary: Negative for frequency and hematuria.  Musculoskeletal: Negative for back pain, joint swelling, gait problem and neck pain.  Skin: Negative for rash.  Neurological: Negative for dizziness, tremors, speech difficulty and weakness.  Psychiatric/Behavioral: Negative for sleep disturbance, dysphoric mood and agitation. The patient is not nervous/anxious.     Objective:  BP 140/98 mmHg  Pulse 89  Wt 175 lb (79.379 kg)  SpO2 91%  BP Readings from Last  3 Encounters:  12/28/15 140/98  06/22/15 148/94  01/26/15 144/85    Wt Readings from Last 3 Encounters:  12/28/15 175 lb (79.379 kg)  06/22/15 173 lb (78.472 kg)  01/26/15 176 lb (79.833 kg)    Physical Exam  Constitutional: He is oriented to person, place, and time. He appears well-developed. No distress.  NAD  HENT:  Mouth/Throat: Oropharynx is clear and moist.  Eyes: Conjunctivae are normal. Pupils are equal, round, and reactive to light.  Neck: Normal range of motion. No JVD present. No thyromegaly present.  Cardiovascular: Normal rate, regular rhythm, normal heart sounds and intact distal pulses.  Exam reveals no gallop and no friction rub.   No murmur heard. Pulmonary/Chest: Effort normal. No respiratory distress. He has wheezes. He has no rales. He exhibits no tenderness.  Abdominal: Soft. Bowel sounds are normal. He exhibits no distension and no mass. There is no tenderness. There is no rebound and no guarding.  Musculoskeletal: Normal range of motion. He exhibits no edema or tenderness.  Lymphadenopathy:    He has no cervical adenopathy.  Neurological: He is alert and oriented to person, place, and time. He has normal reflexes. No cranial nerve deficit. He exhibits normal muscle tone. He displays a negative Romberg sign. Coordination and gait normal.  Skin: Skin is warm and dry. No rash noted.  Psychiatric: He has a normal mood and affect. His behavior is normal. Judgment and thought content normal.    I personally provided Breo inhaler use teaching. After the teaching patient was able to demonstrate it's use effectively.  All questions were answered  Lab Results  Component Value Date   WBC 9.8 11/23/2014   HGB 16.8 11/23/2014   HCT 49.6 11/23/2014   PLT 213.0 11/23/2014   GLUCOSE 99 11/23/2014   CHOL 174 11/23/2014   TRIG 93.0 11/23/2014   HDL 52.50 11/23/2014   LDLDIRECT 91.6 08/19/2010   LDLCALC 103* 11/23/2014   ALT 17 11/23/2014   AST 17 11/23/2014   NA 139  11/23/2014   K 4.7 11/23/2014   CL 105 11/23/2014   CREATININE 0.71 11/23/2014   BUN 11 11/23/2014   CO2 31 11/23/2014   TSH 0.64 11/23/2014   PSA 1.24 11/23/2014    Dg Chest 2 View  11/23/2014  CLINICAL DATA:  COPD -asthma, tobacco use, currently asymptomatic EXAM: CHEST  2 VIEW COMPARISON:  PA and lateral chest of June 01, 2012 FINDINGS: The lungs remain mildly hyperinflated and clear. The heart and pulmonary vascularity are normal. The mediastinum is normal in width. The trachea is midline. The bony thorax is unremarkable. IMPRESSION: COPD.  There is no active cardiopulmonary disease. Electronically Signed   By: David  Martinique   On: 11/23/2014 14:33    Assessment & Plan:   There are no diagnoses linked to this encounter. I am having Mr. Shiffman maintain his triamcinolone ointment, aspirin, guaiFENesin-codeine, esomeprazole, mometasone-formoterol, and clonazePAM.  No orders of the defined types were placed in this encounter.     Follow-up: No Follow-up on file.  Walker Kehr, MD

## 2015-12-28 NOTE — Assessment & Plan Note (Signed)
2/17 Start Breo. Pt is on 4 cigs a day and a vapor

## 2016-01-20 ENCOUNTER — Other Ambulatory Visit: Payer: Self-pay | Admitting: Internal Medicine

## 2016-05-07 ENCOUNTER — Other Ambulatory Visit: Payer: Self-pay | Admitting: Internal Medicine

## 2016-06-27 ENCOUNTER — Ambulatory Visit: Payer: Managed Care, Other (non HMO) | Admitting: Internal Medicine

## 2016-08-14 ENCOUNTER — Other Ambulatory Visit: Payer: Self-pay | Admitting: Internal Medicine

## 2016-09-18 ENCOUNTER — Other Ambulatory Visit: Payer: Self-pay | Admitting: *Deleted

## 2016-09-18 MED ORDER — FLUTICASONE FUROATE-VILANTEROL 100-25 MCG/INH IN AEPB: 1.0000 | INHALATION_SPRAY | Freq: Every day | RESPIRATORY_TRACT | 1 refills | Status: DC

## 2016-09-19 ENCOUNTER — Ambulatory Visit: Payer: Managed Care, Other (non HMO) | Admitting: Internal Medicine

## 2016-11-16 ENCOUNTER — Other Ambulatory Visit: Payer: Self-pay | Admitting: Internal Medicine

## 2016-12-19 ENCOUNTER — Telehealth: Payer: Self-pay | Admitting: *Deleted

## 2016-12-19 NOTE — Telephone Encounter (Signed)
Wife left msg on triage wanting to know does MD have any coupons on the [facility] inhaler. Pt has made appt to see MD but its not until March 18...Trevor Zavala

## 2016-12-19 NOTE — Telephone Encounter (Signed)
Pls give if we have Thx

## 2016-12-22 MED ORDER — FLUTICASONE FUROATE-VILANTEROL 100-25 MCG/INH IN AEPB: 1.0000 | INHALATION_SPRAY | Freq: Every day | RESPIRATORY_TRACT | 0 refills | Status: DC

## 2016-12-22 NOTE — Telephone Encounter (Signed)
Notified pt/wife have coupon for pick-up. Will need rx printed script for 1 month to be given w/coupon...Trevor Zavala

## 2017-01-16 ENCOUNTER — Encounter: Payer: Self-pay | Admitting: Internal Medicine

## 2017-01-16 ENCOUNTER — Telehealth: Payer: Self-pay

## 2017-01-16 ENCOUNTER — Ambulatory Visit (INDEPENDENT_AMBULATORY_CARE_PROVIDER_SITE_OTHER): Payer: Managed Care, Other (non HMO) | Admitting: Internal Medicine

## 2017-01-16 VITALS — BP 156/92 | HR 77 | Temp 98.7°F | Resp 16 | Ht 68.75 in | Wt 177.2 lb

## 2017-01-16 DIAGNOSIS — I10 Essential (primary) hypertension: Secondary | ICD-10-CM | POA: Insufficient documentation

## 2017-01-16 DIAGNOSIS — K635 Polyp of colon: Secondary | ICD-10-CM | POA: Insufficient documentation

## 2017-01-16 DIAGNOSIS — Z0000 Encounter for general adult medical examination without abnormal findings: Secondary | ICD-10-CM | POA: Diagnosis not present

## 2017-01-16 DIAGNOSIS — J449 Chronic obstructive pulmonary disease, unspecified: Secondary | ICD-10-CM

## 2017-01-16 DIAGNOSIS — F17200 Nicotine dependence, unspecified, uncomplicated: Secondary | ICD-10-CM

## 2017-01-16 DIAGNOSIS — F411 Generalized anxiety disorder: Secondary | ICD-10-CM

## 2017-01-16 DIAGNOSIS — D126 Benign neoplasm of colon, unspecified: Secondary | ICD-10-CM | POA: Diagnosis not present

## 2017-01-16 DIAGNOSIS — R030 Elevated blood-pressure reading, without diagnosis of hypertension: Secondary | ICD-10-CM

## 2017-01-16 MED ORDER — VALSARTAN 160 MG PO TABS: 160.0000 mg | ORAL_TABLET | Freq: Every day | ORAL | 11 refills | Status: DC

## 2017-01-16 MED ORDER — CLONAZEPAM 0.5 MG PO TABS: 0.5000 mg | ORAL_TABLET | Freq: Two times a day (BID) | ORAL | 1 refills | Status: DC | PRN

## 2017-01-16 MED ORDER — ESOMEPRAZOLE MAGNESIUM 40 MG PO CPDR: DELAYED_RELEASE_CAPSULE | ORAL | 2 refills | Status: DC

## 2017-01-16 NOTE — Progress Notes (Signed)
Subjective:  Patient ID: Trevor Zavala, male    DOB: [birth year]  Age: 51 y.o. MRN: [id number]  CC: Annual Exam (COPD, Anxiety, Jerrye Bushy )   HPI Trevor Zavala presents for a well exam. He had labs at work in Nov 2017 and a PSA w/Urology  Outpatient Medications Prior to Visit  Medication Sig Dispense Refill  . aspirin (ASPIRIN CHILDRENS) 81 MG chewable tablet Chew 1 tablet (81 mg total) by mouth daily. 100 tablet 11  . clonazePAM (KLONOPIN) 0.5 MG tablet Take 1 tablet (0.5 mg total) by mouth 2 (two) times daily as needed for anxiety. 180 tablet 5  . esomeprazole (NEXIUM) 40 MG capsule TAKE 1 CAPSULE BY MOUTH DAILY WITH BREAKFAST (PATIENT IS OVERDUE FOR YEARLY PHYSICAL WITH LABS. MUST SEE MD FOR REFILLS) 60 capsule 0  . fluticasone furoate-vilanterol (BREO ELLIPTA) 100-25 MCG/INH AEPB Inhale 1 puff into the lungs daily. Keep March appt for future refills 1 each 0  . acyclovir (ZOVIRAX) 400 MG tablet TAKE 1 TABLET (400 MG TOTAL) BY MOUTH 3 (THREE) TIMES DAILY. 21 tablet 3  . guaiFENesin-codeine 100-10 MG/5ML syrup     . triamcinolone ointment (KENALOG) 0.5 % Apply 1 application topically 2 (two) times daily. 60 g 3   No facility-administered medications prior to visit.     ROS Review of Systems  Constitutional: Negative for appetite change, fatigue and unexpected weight change.  HENT: Negative for congestion, nosebleeds, sneezing, sore throat and trouble swallowing.   Eyes: Negative for itching and visual disturbance.  Respiratory: Negative for cough.   Cardiovascular: Negative for chest pain, palpitations and leg swelling.  Gastrointestinal: Negative for abdominal distention, blood in stool, diarrhea and nausea.  Genitourinary: Negative for frequency and hematuria.  Musculoskeletal: Negative for back pain, gait problem, joint swelling and neck pain.  Skin: Positive for color change. Negative for rash.  Neurological: Negative for dizziness, tremors, speech difficulty and weakness.    Psychiatric/Behavioral: Negative for agitation, dysphoric mood and sleep disturbance. The patient is not nervous/anxious.     Objective:  BP (!) 156/92   Pulse 77   Temp 98.7 F (37.1 C) (Oral)   Resp 16   Ht 5' 8.75" (1.746 m)   Wt 177 lb 4 oz (80.4 kg)   SpO2 96%   BMI 26.37 kg/m   BP Readings from Last 3 Encounters:  01/16/17 (!) 156/92  12/28/15 (!) 140/98  06/22/15 (!) 148/94    Wt Readings from Last 3 Encounters:  01/16/17 177 lb 4 oz (80.4 kg)  12/28/15 175 lb (79.4 kg)  06/22/15 173 lb (78.5 kg)    Physical Exam  Constitutional: He is oriented to person, place, and time. He appears well-developed and well-nourished. No distress.  HENT:  Head: Normocephalic and atraumatic.  Right Ear: External ear normal.  Left Ear: External ear normal.  Nose: Nose normal.  Mouth/Throat: Oropharynx is clear and moist. No oropharyngeal exudate.  Eyes: Conjunctivae and EOM are normal. Pupils are equal, round, and reactive to light. Right eye exhibits no discharge. Left eye exhibits no discharge. No scleral icterus.  Neck: Normal range of motion. Neck supple. No JVD present. No tracheal deviation present. No thyromegaly present.  Cardiovascular: Normal rate, regular rhythm, normal heart sounds and intact distal pulses.  Exam reveals no gallop and no friction rub.   No murmur heard. Pulmonary/Chest: Effort normal and breath sounds normal. No stridor. No respiratory distress. He has no wheezes. He has no rales. He exhibits no tenderness.  Abdominal: Soft.  Bowel sounds are normal. He exhibits no distension and no mass. There is no tenderness. There is no rebound and no guarding.  Genitourinary: Penis normal.  Musculoskeletal: Normal range of motion. He exhibits no edema or tenderness.  Lymphadenopathy:    He has no cervical adenopathy.  Neurological: He is alert and oriented to person, place, and time. He has normal reflexes. No cranial nerve deficit. He exhibits normal muscle tone.  Coordination normal.  Skin: Skin is warm and dry. Rash noted. He is not diaphoretic. No erythema. No pallor.  Psychiatric: He has a normal mood and affect. His behavior is normal. Judgment and thought content normal.  bruises on B forearms/hyperpigmentation Rectal - ok per Urology  Lab Results  Component Value Date   WBC 9.8 11/23/2014   HGB 16.8 11/23/2014   HCT 49.6 11/23/2014   PLT 213.0 11/23/2014   GLUCOSE 99 11/23/2014   CHOL 174 11/23/2014   TRIG 93.0 11/23/2014   HDL 52.50 11/23/2014   LDLDIRECT 91.6 08/19/2010   LDLCALC 103 (H) 11/23/2014   ALT 17 11/23/2014   AST 17 11/23/2014   NA 139 11/23/2014   K 4.7 11/23/2014   CL 105 11/23/2014   CREATININE 0.71 11/23/2014   BUN 11 11/23/2014   CO2 31 11/23/2014   TSH 0.64 11/23/2014   PSA 1.24 11/23/2014    Dg Chest 2 View  Result Date: 11/23/2014 CLINICAL DATA:  COPD -asthma, tobacco use, currently asymptomatic EXAM: CHEST  2 VIEW COMPARISON:  PA and lateral chest of June 01, 2012 FINDINGS: The lungs remain mildly hyperinflated and clear. The heart and pulmonary vascularity are normal. The mediastinum is normal in width. The trachea is midline. The bony thorax is unremarkable. IMPRESSION: COPD.  There is no active cardiopulmonary disease. Electronically Signed   By: David  Martinique   On: 11/23/2014 14:33    Assessment & Plan:   There are no diagnoses linked to this encounter. I have discontinued Mr. Detert's triamcinolone ointment and guaiFENesin-codeine. I am also having him maintain his aspirin, clonazePAM, acyclovir, esomeprazole, and fluticasone furoate-vilanterol.  No orders of the defined types were placed in this encounter.    Follow-up: No Follow-up on file.  Walker Kehr, MD

## 2017-01-16 NOTE — Assessment & Plan Note (Signed)
Check BP at home Start Diovan if SBP >140-150

## 2017-01-16 NOTE — Telephone Encounter (Signed)
Refill for klonopin was faxed to [facility] per dr Alain Marion

## 2017-01-16 NOTE — Assessment & Plan Note (Signed)
Clonazepam prn 

## 2017-01-16 NOTE — Assessment & Plan Note (Signed)
We discussed age appropriate health related issues, including available/recomended screening tests and vaccinations. We discussed a need for adhering to healthy diet and exercise. Labs were done at work - will get the results  All questions were answered.

## 2017-01-16 NOTE — Assessment & Plan Note (Signed)
Discussed 1/2 ppd now

## 2017-01-16 NOTE — Assessment & Plan Note (Signed)
Colon q 3 years due next in 2019

## 2017-01-16 NOTE — Assessment & Plan Note (Signed)
Breo

## 2017-01-16 NOTE — Progress Notes (Signed)
Pre-visit discussion using our clinic review tool. No additional management support is needed unless otherwise documented below in the visit note.  

## 2017-01-26 ENCOUNTER — Other Ambulatory Visit: Payer: Self-pay | Admitting: Internal Medicine

## 2017-02-18 ENCOUNTER — Telehealth: Payer: Self-pay | Admitting: *Deleted

## 2017-02-18 NOTE — Telephone Encounter (Signed)
Rec'd fax pt requesting refills on his clonzapam 34 day script fax to [facility]...Johny Chess

## 2017-02-18 NOTE — Telephone Encounter (Signed)
OK to fill this/these prescription(s) with additional refills x0 Needs to have an OV every 3 months Thank you!  

## 2017-02-19 MED ORDER — CLONAZEPAM 0.5 MG PO TABS: 0.5000 mg | ORAL_TABLET | Freq: Two times a day (BID) | ORAL | 0 refills | Status: DC | PRN

## 2017-02-19 NOTE — Telephone Encounter (Signed)
Printed script and faxed to [facility] @ [phone]...Johny Chess

## 2017-05-26 ENCOUNTER — Encounter: Payer: Self-pay | Admitting: Family Medicine

## 2017-05-26 ENCOUNTER — Ambulatory Visit (INDEPENDENT_AMBULATORY_CARE_PROVIDER_SITE_OTHER): Payer: Managed Care, Other (non HMO) | Admitting: Family Medicine

## 2017-05-26 ENCOUNTER — Telehealth: Payer: Self-pay | Admitting: Internal Medicine

## 2017-05-26 VITALS — BP 107/75 | HR 82 | Temp 98.4°F | Ht 68.75 in | Wt 160.0 lb

## 2017-05-26 DIAGNOSIS — R918 Other nonspecific abnormal finding of lung field: Secondary | ICD-10-CM

## 2017-05-26 DIAGNOSIS — J189 Pneumonia, unspecified organism: Secondary | ICD-10-CM

## 2017-05-26 DIAGNOSIS — R042 Hemoptysis: Secondary | ICD-10-CM | POA: Diagnosis not present

## 2017-05-26 NOTE — Progress Notes (Signed)
   Subjective:    Patient ID: Trevor Zavala, male    DOB: 07-19-1966, 51 y.o.   MRN: [id number]  HPI Here for an episode of coughing up sputum with bright red blood in it last night. He is a long time smoker and he has been diagnosed with COPD. He has a chronic cough which usually dry but sometimes produces some clear sputum. He has never coughed up blood before. He has not coughed up any more blood today. He has some mild baseline SOB but no chest pain. No fever. His last CXR was about 2 and 1/2 years ago.    Review of Systems  Constitutional: Negative.   HENT: Negative.   Eyes: Negative.   Respiratory: Positive for cough.        Objective:   Physical Exam  Constitutional: He is oriented to person, place, and time. He appears well-developed and well-nourished.  Neck: No thyromegaly present.  Cardiovascular: Normal rate, regular rhythm, normal heart sounds and intact distal pulses.   Pulmonary/Chest: Effort normal and breath sounds normal. No respiratory distress. He has no wheezes. He has no rales. He exhibits no tenderness.  Lymphadenopathy:    He has no cervical adenopathy.  Neurological: He is alert and oriented to person, place, and time.          Assessment & Plan:  Hemoptysis in a long time smoker. He seems to be stable today. We will send him for a CXR and he will follow up with Dr. Alain Marion.  Alysia Penna, MD

## 2017-05-26 NOTE — Patient Instructions (Signed)
WE NOW OFFER   [facility] FAST TRACK!!!  SAME DAY Appointments for ACUTE CARE  Such as: Sprains, Injuries, cuts, abrasions, rashes, muscle pain, joint pain, back pain Colds, flu, sore throats, headache, allergies, cough, fever  Ear pain, sinus and eye infections Abdominal pain, nausea, vomiting, diarrhea, upset stomach Animal/insect bites  3 Easy Ways to Schedule: Walk-In Scheduling Call in scheduling Mychart Sign-up: [email or web address]         

## 2017-05-26 NOTE — Telephone Encounter (Signed)
Spouse called to make appt to be seen for sick visit.   Call got disconnected.  Tried calling patient.  Left message to call back to schedule appt.

## 2017-05-27 ENCOUNTER — Ambulatory Visit: Payer: Managed Care, Other (non HMO) | Admitting: Family Medicine

## 2017-05-27 ENCOUNTER — Ambulatory Visit (INDEPENDENT_AMBULATORY_CARE_PROVIDER_SITE_OTHER)
Admission: RE | Admit: 2017-05-27 | Discharge: 2017-05-27 | Disposition: A | Discharge status: Home / Self Care | Payer: Managed Care, Other (non HMO) | Source: Ambulatory Visit | Attending: Family Medicine | Admitting: Family Medicine

## 2017-05-27 DIAGNOSIS — R042 Hemoptysis: Secondary | ICD-10-CM | POA: Diagnosis not present

## 2017-06-01 ENCOUNTER — Other Ambulatory Visit (INDEPENDENT_AMBULATORY_CARE_PROVIDER_SITE_OTHER): Payer: Managed Care, Other (non HMO)

## 2017-06-01 ENCOUNTER — Ambulatory Visit (INDEPENDENT_AMBULATORY_CARE_PROVIDER_SITE_OTHER): Payer: Managed Care, Other (non HMO) | Admitting: Internal Medicine

## 2017-06-01 ENCOUNTER — Encounter: Payer: Self-pay | Admitting: Internal Medicine

## 2017-06-01 DIAGNOSIS — I10 Essential (primary) hypertension: Secondary | ICD-10-CM | POA: Diagnosis not present

## 2017-06-01 DIAGNOSIS — R042 Hemoptysis: Secondary | ICD-10-CM

## 2017-06-01 DIAGNOSIS — R634 Abnormal weight loss: Secondary | ICD-10-CM

## 2017-06-01 DIAGNOSIS — J449 Chronic obstructive pulmonary disease, unspecified: Secondary | ICD-10-CM

## 2017-06-01 DIAGNOSIS — F17200 Nicotine dependence, unspecified, uncomplicated: Secondary | ICD-10-CM | POA: Diagnosis not present

## 2017-06-01 LAB — CBC WITH DIFFERENTIAL/PLATELET
BASOS ABS: 0.1 10*3/uL (ref 0.0–0.1)
Basophils Relative: 0.6 % (ref 0.0–3.0)
EOS ABS: 0 10*3/uL (ref 0.0–0.7)
EOS PCT: 0.4 % (ref 0.0–5.0)
HCT: 43.3 % (ref 39.0–52.0)
HEMOGLOBIN: 14.5 g/dL (ref 13.0–17.0)
Lymphocytes Relative: 15.8 % (ref 12.0–46.0)
Lymphs Abs: 1.6 10*3/uL (ref 0.7–4.0)
MCHC: 33.6 g/dL (ref 30.0–36.0)
MCV: 99.2 fl (ref 78.0–100.0)
MONO ABS: 0.7 10*3/uL (ref 0.1–1.0)
Monocytes Relative: 6.8 % (ref 3.0–12.0)
Neutro Abs: 7.6 10*3/uL (ref 1.4–7.7)
Neutrophils Relative %: 76.4 % (ref 43.0–77.0)
Platelets: 316 10*3/uL (ref 150.0–400.0)
RBC: 4.37 Mil/uL (ref 4.22–5.81)
RDW: 13.3 % (ref 11.5–15.5)
WBC: 10 10*3/uL (ref 4.0–10.5)

## 2017-06-01 LAB — BASIC METABOLIC PANEL
BUN: 9 mg/dL (ref 6–23)
CO2: 30 mEq/L (ref 19–32)
Calcium: 9.5 mg/dL (ref 8.4–10.5)
Chloride: 101 mEq/L (ref 96–112)
Creatinine, Ser: 0.82 mg/dL (ref 0.40–1.50)
GFR: 104.59 mL/min (ref >60.00)
Glucose, Bld: 105 mg/dL — ABNORMAL HIGH (ref 70–99)
POTASSIUM: 4 meq/L (ref 3.5–5.1)
Sodium: 138 mEq/L (ref 135–145)

## 2017-06-01 LAB — URINALYSIS
BILIRUBIN URINE: NEGATIVE
Hgb urine dipstick: NEGATIVE
KETONES UR: NEGATIVE
Leukocytes, UA: NEGATIVE
Nitrite: NEGATIVE
Specific Gravity, Urine: <=1.005 — AB (ref 1.000–1.030)
TOTAL PROTEIN, URINE-UPE24: NEGATIVE
UROBILINOGEN UA: 0.2 (ref 0.0–1.0)
Urine Glucose: NEGATIVE
pH: 6 (ref 5.0–8.0)

## 2017-06-01 LAB — HEPATIC FUNCTION PANEL
ALK PHOS: 68 U/L (ref 39–117)
ALT: 14 U/L (ref 0–53)
AST: 12 U/L (ref 0–37)
Albumin: 3.8 g/dL (ref 3.5–5.2)
BILIRUBIN DIRECT: 0 mg/dL (ref 0.0–0.3)
BILIRUBIN TOTAL: 0.3 mg/dL (ref 0.2–1.2)
Total Protein: 6.4 g/dL (ref 6.0–8.3)

## 2017-06-01 LAB — APTT: APTT: 29.9 s (ref 23.4–32.7)

## 2017-06-01 LAB — PROTIME-INR
INR: 1 ratio (ref 0.8–1.0)
Prothrombin Time: 11.2 s (ref 9.6–13.1)

## 2017-06-01 LAB — SEDIMENTATION RATE: Sed Rate: 24 mm/hr — ABNORMAL HIGH (ref 0–20)

## 2017-06-01 LAB — TSH: TSH: 1.76 u[IU]/mL (ref 0.35–4.50)

## 2017-06-01 NOTE — Assessment & Plan Note (Signed)
Trying to quit smoking 1/2 ppd

## 2017-06-01 NOTE — Assessment & Plan Note (Signed)
Chest CT Labs Pulm ref

## 2017-06-01 NOTE — Assessment & Plan Note (Signed)
Valsartan - BT:YOMAYO - Christella Scheuermann said it is ok; the pt does not want to switch

## 2017-06-01 NOTE — Assessment & Plan Note (Signed)
Trying to quit smoking Breo

## 2017-06-01 NOTE — Assessment & Plan Note (Signed)
Wt Readings from Last 3 Encounters:  06/01/17 164 lb (74.4 kg)  05/26/17 160 lb (72.6 kg)  01/16/17 177 lb 4 oz (80.4 kg)

## 2017-06-01 NOTE — Progress Notes (Signed)
Subjective:  Patient ID: Trevor Zavala, male    DOB: 09/29/1966  Age: 51 y.o. MRN: [id number]  CC: No chief complaint on file.   HPI KEITHON MCCOIN presents for coughing blood up once last week - bright red, a teaspoon. No other sx's. On baby ASA. 1-1/2 ppd smoking... Wt loss...  Outpatient Medications Prior to Visit  Medication Sig Dispense Refill  . acyclovir (ZOVIRAX) 400 MG tablet TAKE 1 TABLET (400 MG TOTAL) BY MOUTH 3 (THREE) TIMES DAILY. 21 tablet 3  . aspirin (ASPIRIN CHILDRENS) 81 MG chewable tablet Chew 1 tablet (81 mg total) by mouth daily. 100 tablet 11  . BREO ELLIPTA 100-25 MCG/INH AEPB INHALE 1 PUFF INTO THE LUNGS DAILY 60 each 11  . clonazePAM (KLONOPIN) 0.5 MG tablet Take 1 tablet (0.5 mg total) by mouth 2 (two) times daily as needed for anxiety. 180 tablet 0  . esomeprazole (NEXIUM) 40 MG capsule TAKE 1 CAPSULE BY MOUTH DAILY WITH BREAKFAST 90 capsule 2  . valsartan (DIOVAN) 160 MG tablet Take 1 tablet (160 mg total) by mouth daily. 30 tablet 11   No facility-administered medications prior to visit.     ROS Review of Systems  Constitutional: Negative for appetite change, fatigue and unexpected weight change.  HENT: Negative for congestion, nosebleeds, sneezing, sore throat and trouble swallowing.   Eyes: Negative for itching and visual disturbance.  Respiratory: Negative for cough.   Cardiovascular: Negative for chest pain, palpitations and leg swelling.  Gastrointestinal: Negative for abdominal distention, blood in stool, diarrhea and nausea.  Genitourinary: Negative for frequency and hematuria.  Musculoskeletal: Negative for back pain, gait problem, joint swelling and neck pain.  Skin: Negative for rash.  Neurological: Negative for dizziness, tremors, speech difficulty and weakness.  Psychiatric/Behavioral: Negative for agitation, dysphoric mood, sleep disturbance and suicidal ideas. The patient is not nervous/anxious.     Objective:  BP 128/82 (BP  Location: Left Arm, Patient Position: Sitting, Cuff Size: Normal)   Pulse 74   Temp 98.6 F (37 C) (Oral)   Ht 5' 8.75" (1.746 m)   Wt 164 lb (74.4 kg)   SpO2 99%   BMI 24.40 kg/m   BP Readings from Last 3 Encounters:  06/01/17 128/82  05/26/17 107/75  01/16/17 (!) 156/92    Wt Readings from Last 3 Encounters:  06/01/17 164 lb (74.4 kg)  05/26/17 160 lb (72.6 kg)  01/16/17 177 lb 4 oz (80.4 kg)    Physical Exam  Constitutional: He is oriented to person, place, and time. He appears well-developed. No distress.  NAD  HENT:  Mouth/Throat: Oropharynx is clear and moist.  Eyes: Pupils are equal, round, and reactive to light. Conjunctivae are normal.  Neck: Normal range of motion. No JVD present. No thyromegaly present.  Cardiovascular: Normal rate, regular rhythm, normal heart sounds and intact distal pulses.  Exam reveals no gallop and no friction rub.   No murmur heard. Pulmonary/Chest: Effort normal and breath sounds normal. No respiratory distress. He has no wheezes. He has no rales. He exhibits no tenderness.  Abdominal: Soft. Bowel sounds are normal. He exhibits no distension and no mass. There is no tenderness. There is no rebound and no guarding.  Musculoskeletal: Normal range of motion. He exhibits no edema or tenderness.  Lymphadenopathy:    He has no cervical adenopathy.  Neurological: He is alert and oriented to person, place, and time. He has normal reflexes. No cranial nerve deficit. He exhibits normal muscle tone. He displays a  negative Romberg sign. Coordination and gait normal.  Skin: Skin is warm and dry. No rash noted.  Psychiatric: He has a normal mood and affect. His behavior is normal. Judgment and thought content normal.  bruised skin - forearms B  Lab Results  Component Value Date   WBC 9.8 11/23/2014   HGB 16.8 11/23/2014   HCT 49.6 11/23/2014   PLT 213.0 11/23/2014   GLUCOSE 99 11/23/2014   CHOL 174 11/23/2014   TRIG 93.0 11/23/2014   HDL 52.50  11/23/2014   LDLDIRECT 91.6 08/19/2010   LDLCALC 103 (H) 11/23/2014   ALT 17 11/23/2014   AST 17 11/23/2014   NA 139 11/23/2014   K 4.7 11/23/2014   CL 105 11/23/2014   CREATININE 0.71 11/23/2014   BUN 11 11/23/2014   CO2 31 11/23/2014   TSH 0.64 11/23/2014   PSA 1.24 11/23/2014    Dg Chest 2 View  Result Date: 05/27/2017 CLINICAL DATA:  Cough and congestion for 1 week EXAM: CHEST  2 VIEW COMPARISON:  11/23/2014 FINDINGS: Cardiac shadow is stable. Lungs are hyperinflated bilaterally. No focal infiltrate or sizable effusion is seen. Persistent lingular scarring is noted. No bony abnormality is seen. IMPRESSION: Mild COPD without acute abnormality. Some lingular scarring remains. Electronically Signed   By: Inez Catalina M.D.   On: 05/27/2017 16:56    Assessment & Plan:   There are no diagnoses linked to this encounter. I am having Mr. Hinz maintain his aspirin, acyclovir, valsartan, esomeprazole, BREO ELLIPTA, and clonazePAM.  No orders of the defined types were placed in this encounter.    Follow-up: No Follow-up on file.  Walker Kehr, MD

## 2017-06-03 LAB — QUANTIFERON TB GOLD ASSAY (BLOOD)
INTERFERON GAMMA RELEASE ASSAY: NEGATIVE
Mitogen-Nil: 7.87 IU/mL
QUANTIFERON NIL VALUE: 0.06 [IU]/mL

## 2017-06-05 ENCOUNTER — Ambulatory Visit (INDEPENDENT_AMBULATORY_CARE_PROVIDER_SITE_OTHER)
Admission: RE | Admit: 2017-06-05 | Discharge: 2017-06-05 | Disposition: A | Discharge status: Home / Self Care | Payer: Managed Care, Other (non HMO) | Source: Ambulatory Visit | Attending: Internal Medicine | Admitting: Internal Medicine

## 2017-06-05 DIAGNOSIS — J449 Chronic obstructive pulmonary disease, unspecified: Secondary | ICD-10-CM

## 2017-06-05 DIAGNOSIS — R042 Hemoptysis: Secondary | ICD-10-CM

## 2017-06-05 DIAGNOSIS — R634 Abnormal weight loss: Secondary | ICD-10-CM | POA: Diagnosis not present

## 2017-06-05 DIAGNOSIS — F17200 Nicotine dependence, unspecified, uncomplicated: Secondary | ICD-10-CM

## 2017-06-05 MED ORDER — IOPAMIDOL (ISOVUE-300) INJECTION 61%
80.0000 mL | Freq: Once | INTRAVENOUS | Status: AC | PRN
  Administered 2017-06-05: 80 mL via INTRAVENOUS

## 2017-06-05 MED ORDER — LEVOFLOXACIN 750 MG PO TABS: 750.0000 mg | ORAL_TABLET | Freq: Every day | ORAL | 0 refills | Status: DC

## 2017-06-05 NOTE — Telephone Encounter (Signed)
-----   Message from Ander Slade, RN sent at 06/05/2017  2:48 PM EDT ----- Routing to Aleiyah Halpin---please advise if I need to contact patient right away or what other instructions to give patient---in dr plotnikovs absence, thanks

## 2017-06-05 NOTE — Telephone Encounter (Signed)
Please open up an appointment slot for 8:30am on 8/2 for me to see this patient. Then call and confirm the time/location with him. Then route the message back to me so I make sure to add it to my schedule to be in early to clinic. Thanks.

## 2017-06-06 ENCOUNTER — Encounter (HOSPITAL_COMMUNITY): Payer: Self-pay

## 2017-06-06 ENCOUNTER — Emergency Department (HOSPITAL_COMMUNITY): Payer: Managed Care, Other (non HMO)

## 2017-06-06 ENCOUNTER — Emergency Department (HOSPITAL_COMMUNITY)
Admission: EM | Admit: 2017-06-06 | Discharge: 2017-06-06 | Disposition: A | Discharge status: Home / Self Care | Payer: Managed Care, Other (non HMO) | Attending: Emergency Medicine | Admitting: Emergency Medicine

## 2017-06-06 DIAGNOSIS — I10 Essential (primary) hypertension: Secondary | ICD-10-CM | POA: Insufficient documentation

## 2017-06-06 DIAGNOSIS — R918 Other nonspecific abnormal finding of lung field: Secondary | ICD-10-CM

## 2017-06-06 DIAGNOSIS — J45909 Unspecified asthma, uncomplicated: Secondary | ICD-10-CM | POA: Insufficient documentation

## 2017-06-06 DIAGNOSIS — J189 Pneumonia, unspecified organism: Secondary | ICD-10-CM | POA: Insufficient documentation

## 2017-06-06 DIAGNOSIS — J449 Chronic obstructive pulmonary disease, unspecified: Secondary | ICD-10-CM | POA: Insufficient documentation

## 2017-06-06 DIAGNOSIS — Z79899 Other long term (current) drug therapy: Secondary | ICD-10-CM | POA: Insufficient documentation

## 2017-06-06 DIAGNOSIS — Z791 Long term (current) use of non-steroidal anti-inflammatories (NSAID): Secondary | ICD-10-CM | POA: Diagnosis not present

## 2017-06-06 DIAGNOSIS — Z87891 Personal history of nicotine dependence: Secondary | ICD-10-CM | POA: Diagnosis not present

## 2017-06-06 DIAGNOSIS — R0602 Shortness of breath: Secondary | ICD-10-CM | POA: Diagnosis present

## 2017-06-06 DIAGNOSIS — R071 Chest pain on breathing: Secondary | ICD-10-CM | POA: Insufficient documentation

## 2017-06-06 LAB — CBC WITH DIFFERENTIAL/PLATELET
BASOS ABS: 0 10*3/uL (ref 0.0–0.1)
BASOS PCT: 0 %
EOS ABS: 0 10*3/uL (ref 0.0–0.7)
EOS PCT: 0 %
HCT: 40.5 % (ref 39.0–52.0)
Hemoglobin: 13.3 g/dL (ref 13.0–17.0)
Lymphocytes Relative: 13 %
Lymphs Abs: 1.1 10*3/uL (ref 0.7–4.0)
MCH: 31.9 pg (ref 26.0–34.0)
MCHC: 32.8 g/dL (ref 30.0–36.0)
MCV: 97.1 fL (ref 78.0–100.0)
MONO ABS: 0.6 10*3/uL (ref 0.1–1.0)
MONOS PCT: 7 %
NEUTROS ABS: 7.3 10*3/uL (ref 1.7–7.7)
Neutrophils Relative %: 80 %
PLATELETS: 283 10*3/uL (ref 150–400)
RBC: 4.17 MIL/uL — ABNORMAL LOW (ref 4.22–5.81)
RDW: 12.6 % (ref 11.5–15.5)
WBC: 9.1 10*3/uL (ref 4.0–10.5)

## 2017-06-06 LAB — BASIC METABOLIC PANEL
ANION GAP: 8 (ref 5–15)
BUN: 5 mg/dL — ABNORMAL LOW (ref 6–20)
CALCIUM: 9 mg/dL (ref 8.9–10.3)
CO2: 26 mmol/L (ref 22–32)
CREATININE: 0.69 mg/dL (ref 0.61–1.24)
Chloride: 103 mmol/L (ref 101–111)
Glucose, Bld: 103 mg/dL — ABNORMAL HIGH (ref 65–99)
Potassium: 3.7 mmol/L (ref 3.5–5.1)
SODIUM: 137 mmol/L (ref 135–145)

## 2017-06-06 LAB — I-STAT CHEM 8, ED
BUN: 5 mg/dL — ABNORMAL LOW (ref 6–20)
CALCIUM ION: 1.13 mmol/L — AB (ref 1.15–1.40)
CHLORIDE: 102 mmol/L (ref 101–111)
Creatinine, Ser: 0.6 mg/dL — ABNORMAL LOW (ref 0.61–1.24)
GLUCOSE: 101 mg/dL — AB (ref 65–99)
HCT: 39 % (ref 39.0–52.0)
Hemoglobin: 13.3 g/dL (ref 13.0–17.0)
Potassium: 3.7 mmol/L (ref 3.5–5.1)
SODIUM: 138 mmol/L (ref 135–145)
TCO2: 26 mmol/L (ref 0–100)

## 2017-06-06 LAB — I-STAT TROPONIN, ED: Troponin i, poc: 0 ng/mL (ref 0.00–0.08)

## 2017-06-06 MED ORDER — NICOTINE 21 MG/24HR TD PT24
21.0000 mg | MEDICATED_PATCH | Freq: Every day | TRANSDERMAL | Status: DC
  Administered 2017-06-06: 21 mg via TRANSDERMAL
  Filled 2017-06-06: qty 1

## 2017-06-06 MED ORDER — MORPHINE SULFATE (PF) 4 MG/ML IV SOLN
4.0000 mg | Freq: Once | INTRAVENOUS | Status: AC
  Administered 2017-06-06: 4 mg via INTRAVENOUS
  Filled 2017-06-06: qty 1

## 2017-06-06 MED ORDER — SODIUM CHLORIDE 0.9 % IV BOLUS (SEPSIS)
1000.0000 mL | Freq: Once | INTRAVENOUS | Status: AC
  Administered 2017-06-06: 1000 mL via INTRAVENOUS

## 2017-06-06 MED ORDER — NICOTINE 21 MG/24HR TD PT24: 21.0000 mg | MEDICATED_PATCH | Freq: Every day | TRANSDERMAL | 0 refills | Status: DC

## 2017-06-06 MED ORDER — ONDANSETRON HCL 4 MG/2ML IJ SOLN
4.0000 mg | Freq: Once | INTRAMUSCULAR | Status: AC
  Administered 2017-06-06: 4 mg via INTRAVENOUS
  Filled 2017-06-06: qty 2

## 2017-06-06 MED ORDER — IOPAMIDOL (ISOVUE-370) INJECTION 76%
80.0000 mL | Freq: Once | INTRAVENOUS | Status: AC | PRN
  Administered 2017-06-06: 80 mL via INTRAVENOUS

## 2017-06-06 NOTE — ED Triage Notes (Signed)
Per Pt, Pt is coming from MD office after getting a Ct Scan yesterday. Pt was reported to have mass in his left chest that is pressing on his esophagus. If he started to have SOB, he was to come to the ED. Pt reports cough "for a while" with some left upper chest pain and SOB x 1 week.

## 2017-06-06 NOTE — ED Notes (Signed)
Declined W/C at D/C and was escorted to lobby by RN. 

## 2017-06-06 NOTE — ED Notes (Signed)
Patient transported to CT 

## 2017-06-06 NOTE — ED Provider Notes (Addendum)
[facility] DEPT Provider Note   CSN: [id number] Arrival date & time: 06/06/17  3151     History   Chief Complaint Chief Complaint  Patient presents with  . Shortness of Breath    HPI Trevor Zavala is a 51 y.o. male.  51 yo M with hemoptysis.  Saw his doctor and had an outpatient CT.  Images concerning for L hilar lung mass.  This morning with some worsening sob, and having L sided cp radiating to his back.  4/10 in severity.  Constant.  Worse with coughing or breathing.     The history is provided by the patient.  Shortness of Breath  This is a new problem. The average episode lasts 2 weeks. The problem occurs continuously.The current episode started more than 1 week ago. The problem has been gradually worsening. Associated symptoms include chest pain. Pertinent negatives include no fever, no headaches, no vomiting, no abdominal pain and no rash. He has tried nothing for the symptoms. The treatment provided no relief. He has had no prior hospitalizations. He has had no prior ED visits. He has had no prior ICU admissions. Associated medical issues do not include PE or DVT.    Past Medical History:  Diagnosis Date  . Asthma   . COPD (chronic obstructive pulmonary disease) ([facility])   . GERD (gastroesophageal reflux disease)   . Situational depression     Patient Active Problem List   Diagnosis Date Noted  . Hemoptysis 06/01/2017  . Weight loss 06/01/2017  . Colon polyps 01/16/2017  . Essential hypertension 01/16/2017  . Generalized anxiety disorder 06/22/2015  . Situational depression 02/03/2014  . Post-operative state 12/02/2013  . Infected epidermoid cyst 10/28/2013  . Lipoma of face 10/18/2013  . Well adult exam 10/07/2013  . Hand eczema 10/07/2013  . Wheezing on auscultation 06/01/2012  . Chest pain 06/01/2012  . Back pain 06/01/2012  . Sebaceous cyst 04/05/2012  . Contracture of palmar fascia (Dupuytren's) 04/05/2012  . Lipoma of forehead 04/05/2012  . Cold  sore 03/19/2012  . Rhinitis, chronic 08/22/2011  . OTITIS MEDIA, MUCOID, CHRONIC 12/31/2010  . DYSPHAGIA UNSPECIFIED 08/23/2010  . TOBACCO USER 07/10/2010  . COPD mixed type ([place]) 07/10/2010  . GERD 07/10/2010  . Cough 07/10/2010    Past Surgical History:  Procedure Laterality Date  . EAR CYST EXCISION N/A 11/17/2013   Procedure: SEBACEOUS CYST CHEST;  Surgeon: Joyice Faster. Cornett, MD;  Location: [facility];  Service: General;  Laterality: N/A;  . KNEE ARTHROSCOPY     LEFT  . LIPOMA EXCISION N/A 11/17/2013   Procedure: EXCISION LIPOMA FOREHEAD;  Surgeon: Joyice Faster. Cornett, MD;  Location: [facility];  Service: General;  Laterality: N/A;       Home Medications    Prior to Admission medications   Medication Sig Start Date End Date Taking? Authorizing Provider  aspirin (ASPIRIN CHILDRENS) 81 MG chewable tablet Chew 1 tablet (81 mg total) by mouth daily. 02/03/14  Yes [name], [name], MD  BREO ELLIPTA 100-25 MCG/INH AEPB INHALE 1 PUFF INTO THE LUNGS DAILY 01/26/17  Yes [name], [name], MD  Cholecalciferol (VITAMIN D PO) Take 1 tablet by mouth daily.   Yes [provider]  clonazePAM (KLONOPIN) 0.5 MG tablet Take 1 tablet (0.5 mg total) by mouth 2 (two) times daily as needed for anxiety. Patient taking differently: Take 0.25 mg by mouth daily.  02/19/17  Yes [name], [name], MD  esomeprazole (NEXIUM) 40 MG capsule TAKE 1  CAPSULE BY MOUTH DAILY WITH BREAKFAST 01/16/17  Yes [name], [name], MD  levofloxacin (LEVAQUIN) 750 MG tablet Take 1 tablet (750 mg total) by mouth daily. 06/05/17  Yes Nche, Charlene Brooke, NP  loratadine (CLARITIN) 10 MG tablet Take 10 mg by mouth daily as needed for allergies.   Yes [provider]  acyclovir (ZOVIRAX) 400 MG tablet TAKE 1 TABLET (400 MG TOTAL) BY MOUTH 3 (THREE) TIMES DAILY. Patient taking differently: TAKE 1 TABLET (400 MG TOTAL) BY MOUTH 3 (THREE) TIMES DAILY prn outbreak 01/21/16   [name],  [name], MD  nicotine (NICODERM CQ) 21 mg/24hr patch Place 1 patch (21 mg total) onto the skin daily. 06/06/17   Deno Etienne, DO  valsartan (DIOVAN) 160 MG tablet Take 1 tablet (160 mg total) by mouth daily. 01/16/17 01/16/18  [name], [name], MD    Family History Family History  Problem Relation Age of Onset  . Breast cancer Mother   . Hypertension Mother   . Colon cancer Mother   . Skin cancer Father   . Hypertension Father   . Colon polyps Father   . Cancer Father 38       b cell lymphoma  . Colon cancer Father     Social History Social History  Substance Use Topics  . Smoking status: Former Smoker    Packs/day: 1.00  . Smokeless tobacco: Never Used  . Alcohol use 0.0 oz/week     Comment: SOCIAL     Allergies   Patient has no known allergies.   Review of Systems Review of Systems  Constitutional: Negative for chills and fever.  HENT: Negative for congestion and facial swelling.   Eyes: Negative for discharge and visual disturbance.  Respiratory: Positive for shortness of breath.   Cardiovascular: Positive for chest pain. Negative for palpitations.  Gastrointestinal: Negative for abdominal pain, diarrhea and vomiting.  Musculoskeletal: Negative for arthralgias and myalgias.  Skin: Negative for color change and rash.  Neurological: Negative for tremors, syncope and headaches.  Psychiatric/Behavioral: Negative for confusion and dysphoric mood.     Physical Exam Updated Vital Signs BP 126/69 (BP Location: Left Arm)   Pulse 69   Temp 98.3 F (36.8 C) (Oral)   Resp 16   Ht 5' 8.75" (1.746 m)   Wt 74.4 kg (164 lb)   SpO2 96%   BMI 24.40 kg/m   Physical Exam  Constitutional: He is oriented to person, place, and time. He appears well-developed and well-nourished.  HENT:  Head: Normocephalic and atraumatic.  Eyes: Pupils are equal, round, and reactive to light. EOM are normal.  Neck: Normal range of motion. Neck supple. No JVD present.  Cardiovascular:  Normal rate and regular rhythm.  Exam reveals no gallop and no friction rub.   No murmur heard. Pulmonary/Chest: No respiratory distress. He has no wheezes.  Abdominal: He exhibits no distension and no mass. There is no tenderness. There is no rebound and no guarding.  Musculoskeletal: Normal range of motion.  Neurological: He is alert and oriented to person, place, and time.  Skin: No rash noted. No pallor.  Psychiatric: He has a normal mood and affect. His behavior is normal.  Nursing note and vitals reviewed.    ED Treatments / Results  Labs (all labs ordered are listed, but only abnormal results are displayed) Labs Reviewed  CBC WITH DIFFERENTIAL/PLATELET - Abnormal; Notable for the following:       Result Value   RBC 4.17 (*)    All  other components within normal limits  BASIC METABOLIC PANEL - Abnormal; Notable for the following:    Glucose, Bld 103 (*)    BUN 5 (*)    All other components within normal limits  I-STAT CHEM 8, ED - Abnormal; Notable for the following:    BUN 5 (*)    Creatinine, Ser 0.60 (*)    Glucose, Bld 101 (*)    Calcium, Ion 1.13 (*)    All other components within normal limits  I-STAT TROPONIN, ED    EKG  EKG Interpretation  Date/Time:  Saturday June 06 2017 09:50:36 EDT Ventricular Rate:  69 PR Interval:    QRS Duration: 83 QT Interval:  407 QTC Calculation: 436 R Axis:   76 Text Interpretation:  Sinus rhythm Baseline wander in lead(s) V3 V4 No old tracing to compare Confirmed by Deno Etienne [phone]) on 06/06/2017 9:54:31 AM       Radiology Dg Chest 2 View  Result Date: 06/06/2017 CLINICAL DATA:  Shortness of breath, cough, left hilar mass EXAM: CHEST  2 VIEW COMPARISON:  05/27/2017, 06/05/2017 FINDINGS: Mild hyperinflation compatible with background emphysema. Left hilar prominence correlates with the CT from yesterday compatible with the ill-defined infiltrative left hilar/ mediastinal mass. Streaky lingula bandlike opacity, compatible  with atelectasis. Right lung remains clear. No significant interval change. No new focal acute airspace process, significant lung collapse, edema, effusion or pneumothorax. Trachea is midline. Monitor leads overlie the chest. No acute osseous finding. No soft tissue abnormality appreciated IMPRESSION: Mild hilar prominence correlates with the left hilar/ mediastinal infiltrative mass by CT from yesterday. Minor lingula partial collapse/ atelectasis Mild hyperinflation No significant interval change or acute process by plain radiography Electronically Signed   By: Jerilynn Mages.  Shick M.D.   On: 06/06/2017 10:57   Ct Chest W Contrast  Result Date: 06/05/2017 CLINICAL DATA:  Hemoptysis 2 weeks ago. Weight loss. Smoker with history of COPD. EXAM: CT CHEST WITH CONTRAST TECHNIQUE: Multidetector CT imaging of the chest was performed during intravenous contrast administration. CONTRAST:  38mL ISOVUE-300 IOPAMIDOL (ISOVUE-300) INJECTION 61% COMPARISON:  Chest radiographs 05/27/2017. Report only from chest CT 04/26/2002 FINDINGS: Cardiovascular: Mild atherosclerosis of the aorta, great vessels and coronary arteries. No evidence of acute pulmonary embolism. There is left pulmonary arterial encasement by tumor, further described below. The heart size is normal. There is no pericardial effusion. Mediastinum/Nodes: There is an irregular left hilar mass, encasing the pulmonary vessels and left upper lobe bronchus. This mass measures up to 4.9 x 4.1 cm transverse on image 76. There is contiguous left hilar and AP window lymphadenopathy. An AP window node measures up to 2.5 x 3.5 cm on image 64. There is an enlarged left supraclavicular node, measuring 15 mm on coronal image 60. There also prominent right hilar (9 mm, image 76) and subcarinal (9 mm, image 78) nodes. The thyroid gland, trachea and esophagus demonstrate no significant findings. Lungs/Pleura: Trace dependent pleural fluid versus thickening on the left. Mild centrilobular  emphysema. As above, there is a large left hilar mass causing narrowing of the left upper lobe bronchus. There is central airway thickening and patchy pneumonitis throughout the left upper lobe, but no lobar collapse. There is a 7 mm left upper lobe pulmonary nodule on image 31. There is another smaller left upper lobe nodule on image 35. No dominant peripheral left lung mass identified. There is an irregular somewhat spiculated right upper lobe nodule measuring 10 x 7 mm on image 49. Upper abdomen: Small nonspecific nodule  involving the lateral limb of the left adrenal gland, measuring 13 x 11 mm on image 166. The right adrenal gland and visualized liver appear unremarkable. Musculoskeletal/Chest wall: There is no chest wall mass or suspicious osseous finding. IMPRESSION: 1. Left hilar mass highly worrisome for bronchogenic carcinoma. There is narrowing of the left upper lobe bronchus and pulmonary arteries with postobstructive left upper lobe pneumonitis. 2. Enlarged mediastinal, hilar and left supraclavicular nodes worrisome for metastatic disease. 3. No dominant peripheral left lung mass identified. There are small left upper lobe nodules and a more worrisome right upper lobe nodule. The latter could reflect a metastasis or synchronous primary. 4. Indeterminate small left adrenal nodule. 5. Tissue sampling recommended. PET-CT may be helpful for further evaluation. 6. These results will be called to the ordering clinician or representative by the Radiologist Assistant, and communication documented in the PACS or zVision Dashboard. Electronically Signed   By: Richardean Sale M.D.   On: 06/05/2017 14:19   Ct Angio Chest Pe W And/or Wo Contrast  Result Date: 06/06/2017 CLINICAL DATA:  Shortness of breath, cough EXAM: CT ANGIOGRAPHY CHEST WITH CONTRAST TECHNIQUE: Multidetector CT imaging of the chest was performed using the standard protocol during bolus administration of intravenous contrast. Multiplanar CT  image reconstructions and MIPs were obtained to evaluate the vascular anatomy. CONTRAST:  80 cc Isovue 370 IV COMPARISON:  Chest x-ray earlier today.  Chest CT 06/05/2017 FINDINGS: Cardiovascular: No filling defects in the pulmonary artery to suggest pulmonary emboli. Atherosclerotic calcifications scattered in the aorta. No evidence of aneurysm. Left pulmonary arterial encasement by tumor as described below. Mediastinum/Nodes: Again noted is the large left hilar mass encasing the pulmonary vessels and left upper lobe bronchus, unchanged since study 2 days ago. AP window adenopathy is stable. No right hilar or axillary adenopathy. Left supraclavicular adenopathy again noted, unchanged. Lungs/Pleura: Airway thickening bilaterally, likely bronchitis. No effusions. Lingular atelectasis. Patchy pneumonitis in the left upper lobe, stable. Previously seen 7 mm nodule in the left upper lobe is not as well visualized but likely present on image 27. Right upper lobe nodule anteriorly on image 47 measures 10 mm, stable. No effusions. Upper Abdomen: Imaging into the upper abdomen shows no acute findings. Musculoskeletal: Chest wall soft tissues are unremarkable. No acute bony abnormality. Review of the MIP images confirms the above findings. IMPRESSION: No evidence of pulmonary embolus. Stable encasement of the left pulmonary vessels and upper lobe bronchus by a irregular left hilar mass. Associated AP window/mediastinal adenopathy and left supraclavicular adenopathy. Findings are stable since study 2 days ago. Findings highly suspicious for bronchogenic carcinoma. Stable bilateral pulmonary nodules. Stable left upper lobe pneumonitis and diffuse peribronchial thickening. Electronically Signed   By: Rolm Baptise M.D.   On: 06/06/2017 13:49    Procedures Procedures (including critical care time) Discussed smoking cessation with patient and was they were offerred resources to help stop.  Total time was 5 min CPT code 99406.    Medications Ordered in ED Medications  nicotine (NICODERM CQ - dosed in mg/24 hours) patch 21 mg (21 mg Transdermal Patch Applied 06/06/17 1345)  morphine 4 MG/ML injection 4 mg (4 mg Intravenous Given 06/06/17 1216)  ondansetron (ZOFRAN) injection 4 mg (4 mg Intravenous Given 06/06/17 1216)  sodium chloride 0.9 % bolus 1,000 mL (0 mLs Intravenous Stopped 06/06/17 1411)  iopamidol (ISOVUE-370) 76 % injection 80 mL (80 mLs Intravenous Contrast Given 06/06/17 1303)     Initial Impression / Assessment and Plan / ED Course  I  have reviewed the triage vital signs and the nursing notes.  Pertinent labs & imaging results that were available during my care of the patient were reviewed by me and considered in my medical decision making (see chart for details).     51 yo M with a cc of chest pain and sob.  Going on since yesterday. Incidentally also had a CT scan yesterday that diagnosed him with possible bronchogenic carcinoma. At that point if he was told that he had chest pain or shortness breath is completely ED. My exam is well-appearing and nontoxic. Chest x-ray with no specific finding. As the patient's CAT scan was not timed for PE and he has new onset cancer I will obtain a CT angiogram to further evaluate.  CTA without PE.  No significant change in cancer size.  No other noted complication.  Trop negative.  D/c home.   2:17 PM:  I have discussed the diagnosis/risks/treatment options with the patient and family and believe the pt to be eligible for discharge home to follow-up with PCP. We also discussed returning to the ED immediately if new or worsening sx occur. We discussed the sx which are most concerning (e.g., sudden worsening pain, fever, inability to tolerate by mouth) that necessitate immediate return. Medications administered to the patient during their visit and any new prescriptions provided to the patient are listed below.  Medications given during this visit Medications  nicotine  (NICODERM CQ - dosed in mg/24 hours) patch 21 mg (21 mg Transdermal Patch Applied 06/06/17 1345)  morphine 4 MG/ML injection 4 mg (4 mg Intravenous Given 06/06/17 1216)  ondansetron (ZOFRAN) injection 4 mg (4 mg Intravenous Given 06/06/17 1216)  sodium chloride 0.9 % bolus 1,000 mL (0 mLs Intravenous Stopped 06/06/17 1411)  iopamidol (ISOVUE-370) 76 % injection 80 mL (80 mLs Intravenous Contrast Given 06/06/17 1303)     The patient appears reasonably screen and/or stabilized for discharge and I doubt any other medical condition or other [facility] requiring further screening, evaluation, or treatment in the ED at this time prior to discharge.    Final Clinical Impressions(s) / ED Diagnoses   Final diagnoses:  Chest pain on breathing  Lung mass  Pneumonitis    New Prescriptions New Prescriptions   NICOTINE (NICODERM CQ) 21 MG/24HR PATCH    Place 1 patch (21 mg total) onto the skin daily.     Deno Etienne, DO 06/06/17 [place], [place], DO 06/07/17 [id number]

## 2017-06-08 ENCOUNTER — Institutional Professional Consult (permissible substitution): Payer: Managed Care, Other (non HMO) | Admitting: Internal Medicine

## 2017-06-08 ENCOUNTER — Other Ambulatory Visit: Payer: Self-pay

## 2017-06-08 ENCOUNTER — Telehealth: Payer: Self-pay | Admitting: *Deleted

## 2017-06-08 DIAGNOSIS — R918 Other nonspecific abnormal finding of lung field: Secondary | ICD-10-CM

## 2017-06-08 NOTE — Telephone Encounter (Signed)
Oncology Nurse Navigator Documentation  Oncology Nurse Navigator Flowsheets 06/08/2017  Navigator Location [facility]  Referral date to RadOnc/MedOnc 06/08/2017  Navigator Encounter Type Telephone/I received referral from Dr. Everrett Coombe office today.  I called patient to set up with Dr. Julien Nordmann.  I was unable to reach but left vm message to call me.  I called [facility] back and updated them on appt.  Ebony Hail, will call patient with the appt time and place.  Mr. Dibert called me back after I completed above note.  I updated him on appt for [place] on 06/11/17 and that Ebony Hail from [facility] will call him with more appt.  He was thankful for the call.   Telephone Outgoing Call  Abnormal Finding Date 06/05/2017  Treatment Phase Abnormal Scans  Barriers/Navigation Needs Coordination of Care  Interventions Coordination of Care  Coordination of Care Appts  Acuity Level 2  Acuity Level 2 Assistance expediting appointments  Time Spent with Patient 30

## 2017-06-08 NOTE — Telephone Encounter (Signed)
Thank you for your prompt response.

## 2017-06-08 NOTE — Telephone Encounter (Signed)
LM to schedule appt.  PLEASE SCHEDULE PATIENT ON 06/11/17--- ARRIVE AT 8:30 PER JN  (CAN BE SCHEDULED IN 9AM SLOT OR OPEN 8:30AM SLOT)

## 2017-06-10 ENCOUNTER — Other Ambulatory Visit: Payer: Self-pay | Admitting: *Deleted

## 2017-06-10 ENCOUNTER — Institutional Professional Consult (permissible substitution) (INDEPENDENT_AMBULATORY_CARE_PROVIDER_SITE_OTHER): Payer: Managed Care, Other (non HMO) | Admitting: Cardiothoracic Surgery

## 2017-06-10 ENCOUNTER — Encounter: Payer: Self-pay | Admitting: Cardiothoracic Surgery

## 2017-06-10 ENCOUNTER — Ambulatory Visit
Admission: RE | Admit: 2017-06-10 | Discharge: 2017-06-10 | Disposition: A | Discharge status: Home / Self Care | Payer: Managed Care, Other (non HMO) | Source: Ambulatory Visit | Attending: Cardiothoracic Surgery | Admitting: Cardiothoracic Surgery

## 2017-06-10 VITALS — BP 112/75 | HR 85 | Resp 20 | Ht 68.75 in | Wt 164.0 lb

## 2017-06-10 DIAGNOSIS — R918 Other nonspecific abnormal finding of lung field: Secondary | ICD-10-CM | POA: Diagnosis not present

## 2017-06-10 LAB — GLUCOSE, CAPILLARY: Glucose-Capillary: 84 mg/dL (ref 65–99)

## 2017-06-10 MED ORDER — FLUDEOXYGLUCOSE F - 18 (FDG) INJECTION
12.5100 | Freq: Once | INTRAVENOUS | Status: AC | PRN
  Administered 2017-06-10: 12.51 via INTRAVENOUS

## 2017-06-10 NOTE — Progress Notes (Addendum)
[address]       Trevor Zavala,[place] [id number]             [phone]                    Trevor Zavala [facility] Medical Record #[id number] Date of Birth: January 07, 1966  Referring: Trevor Anger, MD Primary Care: Trevor Anger, MD  Chief Complaint:    Chief Complaint  Patient presents with  . Lung Lesion    Surgical eval, Chest CTA 06/06/17, PET Sacn 06/10/17     History of Present Illness:    Trevor Zavala 51 y.o. male is seen in the office  today for Left lung mass. The patient has been a long-term smoker for more than 30 years. About 5 weeks ago he had an episode of hemoptysis 1. He denies any fever or chills but has had some night sweats. He notes a 37 pound weight loss over the past 12 months. Last week he had a CT scan of the chest, which was abnormal. The following day he had episode of left upper chest pain and went to the emergency room and a second CT of the chest was performed.  Over the past several days he's had increasing hoarseness noted by himself and his family. Patient also notes several days of bifrontal headaches.      Current Activity/ Functional Status:  Patient is independent with mobility/ambulation, transfers, ADL's, IADL's.   Zubrod Score: At the time of surgery this patient's most appropriate activity status/level should be described as: [x]     0    Normal activity, no symptoms []     1    Restricted in physical strenuous activity but ambulatory, able to do out light work []     2    Ambulatory and capable of self care, unable to do work activities, up and about               >50 % of waking hours                              []     3    Only limited self care, in bed greater than 50% of waking hours []     4    Completely disabled, no self care, confined to bed or chair []     5    Moribund   Past Medical History:  Diagnosis Date  . Asthma   . COPD (chronic obstructive pulmonary disease) ([place])   . GERD (gastroesophageal  reflux disease)   . Situational depression     Past Surgical History:  Procedure Laterality Date  . EAR CYST EXCISION N/A 11/17/2013   Procedure: SEBACEOUS CYST CHEST;  Surgeon: Joyice Faster. Cornett, MD;  Location: [facility];  Service: General;  Laterality: N/A;  . KNEE ARTHROSCOPY     LEFT  . LIPOMA EXCISION N/A 11/17/2013   Procedure: EXCISION LIPOMA FOREHEAD;  Surgeon: Joyice Faster. Cornett, MD;  Location: [facility];  Service: General;  Laterality: N/A;    Family History  Problem Relation Age of Onset  . Breast cancer Mother   . Hypertension Mother   . Colon cancer Mother   . Skin cancer Father   . Hypertension Father   . Colon polyps Father   . Cancer Father 64       b cell lymphoma  .  Colon cancer Father     Social History   Social History  . Marital status: Married    Spouse name: N/A  . Number of children: N/A  . Years of education: N/A   Occupational History  . 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 Smoking status: Former Smoker    Packs/day: 1.00  . Smokeless tobacco: Never Used  . Alcohol use 0.0 oz/week     Comment: SOCIAL  . Drug use: No  . Sexual activity: Yes   Other Topics Concern  . Not on file   Social History Narrative   Daily Caffeine Use:  3    History  Smoking Status  . Former Smoker  . Packs/day: 1.00  Smokeless Tobacco  . Never Used    History  Alcohol Use  . 0.0 oz/week    Comment: SOCIAL     No Known Allergies  Current Outpatient Prescriptions  Medication Sig Dispense Refill  . acyclovir (ZOVIRAX) 400 MG tablet TAKE 1 TABLET (400 MG TOTAL) BY MOUTH 3 (THREE) TIMES DAILY. (Patient taking differently: TAKE 1 TABLET (400 MG TOTAL) BY MOUTH 3 (THREE) TIMES DAILY prn outbreak) 21 tablet 3  . aspirin (ASPIRIN CHILDRENS) 81 MG chewable tablet Chew 1 tablet (81 mg total) by mouth daily. 100 tablet 11  . BREO ELLIPTA 100-25 MCG/INH AEPB INHALE 1 PUFF  INTO THE LUNGS DAILY 60 each 11  . Cholecalciferol (VITAMIN D PO) Take 1 tablet by mouth daily.    . clonazePAM (KLONOPIN) 0.5 MG tablet Take 1 tablet (0.5 mg total) by mouth 2 (two) times daily as needed for anxiety. (Patient taking differently: Take 0.25 mg by mouth daily. ) 180 tablet 0  . esomeprazole (NEXIUM) 40 MG capsule TAKE 1 CAPSULE BY MOUTH DAILY WITH BREAKFAST 90 capsule 2  . levofloxacin (LEVAQUIN) 750 MG tablet Take 1 tablet (750 mg total) by mouth daily. 5 tablet 0  . loratadine (CLARITIN) 10 MG tablet Take 10 mg by mouth daily as needed for allergies.    . nicotine (NICODERM CQ) 21 mg/24hr patch Place 1 patch (21 mg total) onto the skin daily. 28 patch 0  . valsartan (DIOVAN) 160 MG tablet Take 1 tablet (160 mg total) by mouth daily. 30 tablet 11   No current facility-administered medications for this visit.     Pertinent items are noted in HPI.   Review of Systems:     Cardiac Review of Systems: Y or N  Chest Pain [ y   ]  Resting SOB [n   ] Exertional SOB  [ y ]  Orthopnea [ n ]   Pedal Edema [n   ]    Palpitations [ n ] Syncope  [  n]   Presyncope [ n  ]  General Review of Systems: [Y] = yes [  ]=no Constitional: recent weight change y[  ];  Wt loss over the last 3 months [   ] anorexia [  ]; fatigue [  ]; nausea [  ]; night sweats [  ]; fever [  ]; or chills [  ];          Dental: poor dentition[ n ]; Last Dentist visit:   Eye : blurred vision [  ]; diplopia [   ]; vision changes [  ];  Amaurosis fugax[  ]; Resp: cough Blue.Reese  ];  wheezing[y  ];  hemoptysis[y  ]; shortness of breath[ n ];  paroxysmal nocturnal dyspnea[n  ]; dyspnea on exertion[ n ]; or orthopnea[  ];  GI:  gallstones[  ], vomiting[  ];  dysphagia[  ]; melena[  ];  hematochezia [  ]; heartburn[  ];   Hx of  Colonoscopy[  ]; GU: kidney stones [  ]; hematuria[  ];   dysuria [  ];  nocturia[  ];  history of     obstruction [  ]; urinary frequency [  ]             Skin: rash, swelling[  ];, hair loss[  ];   peripheral edema[  ];  or itching[  ]; Musculosketetal: myalgias[  ];  joint swelling[  ];  joint erythema[n  ];  joint pain[  ];  back pain[ n ];  Heme/Lymph: bruising[  ];  bleeding[  ];  anemia[  ];  Neuro: TIA[n  ];  headaches[n  ];  stroke[n  ];  vertigo[n  ];  seizures[ n ];   paresthesias[  ];  difficulty walking[n  ];  Psych:depression[  ]; anxiety[  ];  Endocrine: diabetes[  ];  thyroid dysfunction[  ];  Immunizations: Flu up to date Florencio.Farrier  ]; Pneumococcal up to date Florencio.Farrier  ];  Other:  Physical Exam: BP 112/75   Pulse 85   Resp 20   Ht 5' 8.75" (1.746 m)   Wt 164 lb (74.4 kg)   SpO2 96%   BMI 24.40 kg/m   PHYSICAL EXAMINATION: General appearance: alert, cooperative, appears older than stated age and no distress Head: Normocephalic, without obvious abnormality, atraumatic Neck: no adenopathy, no carotid bruit, no JVD, supple, symmetrical, trachea midline and thyroid not enlarged, symmetric, no tenderness/mass/nodules Lymph nodes: Cervical, supraclavicular, and axillary nodes normal. Resp: clear to auscultation bilaterally Back: symmetric, no curvature. ROM normal. No CVA tenderness. Cardio: regular rate and rhythm, S1, S2 normal, no murmur, click, rub or gallop GI: soft, non-tender; bowel sounds normal; no masses,  no organomegaly Extremities: extremities normal, atraumatic, no cyanosis or edema and Homans sign is negative, no sign of DVT Neurologic: Grossly normal  Diagnostic Studies & Laboratory data:     Recent Radiology Findings:   Dg Chest 2 View  Result Date: 06/06/2017 CLINICAL DATA:  Shortness of breath, cough, left hilar mass EXAM: CHEST  2 VIEW COMPARISON:  05/27/2017, 06/05/2017 FINDINGS: Mild hyperinflation compatible with background emphysema. Left hilar prominence correlates with the CT from yesterday compatible with the ill-defined infiltrative left hilar/ mediastinal mass. Streaky lingula bandlike opacity, compatible with atelectasis. Right lung remains clear.  No significant interval change. No new focal acute airspace process, significant lung collapse, edema, effusion or pneumothorax. Trachea is midline. Monitor leads overlie the chest. No acute osseous finding. No soft tissue abnormality appreciated IMPRESSION: Mild hilar prominence correlates with the left hilar/ mediastinal infiltrative mass by CT from yesterday. Minor lingula partial collapse/ atelectasis Mild hyperinflation No significant interval change or acute process by plain radiography Electronically Signed   By: Jerilynn Mages.  Shick M.D.   On: 06/06/2017 10:57   Dg Chest 2 View  Result Date: 05/27/2017 CLINICAL DATA:  Cough and congestion for 1 week EXAM: CHEST  2 VIEW COMPARISON:  11/23/2014 FINDINGS: Cardiac shadow is stable. Lungs are hyperinflated bilaterally. No focal infiltrate or sizable effusion is seen. Persistent lingular scarring is noted. No bony abnormality is seen. IMPRESSION: Mild COPD without acute abnormality. Some lingular scarring remains. Electronically Signed   By: Inez Catalina M.D.   On: 05/27/2017 16:56  Ct Chest W Contrast  Result Date: 06/05/2017 CLINICAL DATA:  Hemoptysis 2 weeks ago. Weight loss. Smoker with history of COPD. EXAM: CT CHEST WITH CONTRAST TECHNIQUE: Multidetector CT imaging of the chest was performed during intravenous contrast administration. CONTRAST:  59mL ISOVUE-300 IOPAMIDOL (ISOVUE-300) INJECTION 61% COMPARISON:  Chest radiographs 05/27/2017. Report only from chest CT 04/26/2002 FINDINGS: Cardiovascular: Mild atherosclerosis of the aorta, great vessels and coronary arteries. No evidence of acute pulmonary embolism. There is left pulmonary arterial encasement by tumor, further described below. The heart size is normal. There is no pericardial effusion. Mediastinum/Nodes: There is an irregular left hilar mass, encasing the pulmonary vessels and left upper lobe bronchus. This mass measures up to 4.9 x 4.1 cm transverse on image 76. There is contiguous left hilar and  AP window lymphadenopathy. An AP window node measures up to 2.5 x 3.5 cm on image 64. There is an enlarged left supraclavicular node, measuring 15 mm on coronal image 60. There also prominent right hilar (9 mm, image 76) and subcarinal (9 mm, image 78) nodes. The thyroid gland, trachea and esophagus demonstrate no significant findings. Lungs/Pleura: Trace dependent pleural fluid versus thickening on the left. Mild centrilobular emphysema. As above, there is a large left hilar mass causing narrowing of the left upper lobe bronchus. There is central airway thickening and patchy pneumonitis throughout the left upper lobe, but no lobar collapse. There is a 7 mm left upper lobe pulmonary nodule on image 31. There is another smaller left upper lobe nodule on image 35. No dominant peripheral left lung mass identified. There is an irregular somewhat spiculated right upper lobe nodule measuring 10 x 7 mm on image 49. Upper abdomen: Small nonspecific nodule involving the lateral limb of the left adrenal gland, measuring 13 x 11 mm on image 166. The right adrenal gland and visualized liver appear unremarkable. Musculoskeletal/Chest wall: There is no chest wall mass or suspicious osseous finding. IMPRESSION: 1. Left hilar mass highly worrisome for bronchogenic carcinoma. There is narrowing of the left upper lobe bronchus and pulmonary arteries with postobstructive left upper lobe pneumonitis. 2. Enlarged mediastinal, hilar and left supraclavicular nodes worrisome for metastatic disease. 3. No dominant peripheral left lung mass identified. There are small left upper lobe nodules and a more worrisome right upper lobe nodule. The latter could reflect a metastasis or synchronous primary. 4. Indeterminate small left adrenal nodule. 5. Tissue sampling recommended. PET-CT may be helpful for further evaluation. 6. These results will be called to the ordering clinician or representative by the Radiologist Assistant, and communication  documented in the PACS or zVision Dashboard. Electronically Signed   By: Richardean Sale M.D.   On: 06/05/2017 14:19   Ct Angio Chest Pe W And/or Wo Contrast  Result Date: 06/06/2017 CLINICAL DATA:  Shortness of breath, cough EXAM: CT ANGIOGRAPHY CHEST WITH CONTRAST TECHNIQUE: Multidetector CT imaging of the chest was performed using the standard protocol during bolus administration of intravenous contrast. Multiplanar CT image reconstructions and MIPs were obtained to evaluate the vascular anatomy. CONTRAST:  80 cc Isovue 370 IV COMPARISON:  Chest x-ray earlier today.  Chest CT 06/05/2017 FINDINGS: Cardiovascular: No filling defects in the pulmonary artery to suggest pulmonary emboli. Atherosclerotic calcifications scattered in the aorta. No evidence of aneurysm. Left pulmonary arterial encasement by tumor as described below. Mediastinum/Nodes: Again noted is the large left hilar mass encasing the pulmonary vessels and left upper lobe bronchus, unchanged since study 2 days ago. AP window adenopathy is stable. No right hilar  or axillary adenopathy. Left supraclavicular adenopathy again noted, unchanged. Lungs/Pleura: Airway thickening bilaterally, likely bronchitis. No effusions. Lingular atelectasis. Patchy pneumonitis in the left upper lobe, stable. Previously seen 7 mm nodule in the left upper lobe is not as well visualized but likely present on image 27. Right upper lobe nodule anteriorly on image 47 measures 10 mm, stable. No effusions. Upper Abdomen: Imaging into the upper abdomen shows no acute findings. Musculoskeletal: Chest wall soft tissues are unremarkable. No acute bony abnormality. Review of the MIP images confirms the above findings. IMPRESSION: No evidence of pulmonary embolus. Stable encasement of the left pulmonary vessels and upper lobe bronchus by a irregular left hilar mass. Associated AP window/mediastinal adenopathy and left supraclavicular adenopathy. Findings are stable since study 2  days ago. Findings highly suspicious for bronchogenic carcinoma. Stable bilateral pulmonary nodules. Stable left upper lobe pneumonitis and diffuse peribronchial thickening. Electronically Signed   By: Rolm Baptise M.D.   On: 06/06/2017 13:49   Nm Pet Image Initial (pi) Skull Base To Thigh  Result Date: 06/10/2017 CLINICAL DATA:  Initial treatment strategy for left upper lobe mass on CT, presumed bronchogenic carcinoma. EXAM: NUCLEAR MEDICINE PET SKULL BASE TO THIGH TECHNIQUE: 12.51 mCi F-18 FDG was injected intravenously. Full-ring PET imaging was performed from the skull base to thigh after the radiotracer. CT data was obtained and used for attenuation correction and anatomic localization. FASTING BLOOD GLUCOSE:  Value: 84 Mg/dl COMPARISON:  Chest CT 06/05/2017 and 06/06/2017. FINDINGS: NECK Hypermetabolic left supraclavicular node measures 16 mm on image 74 and has an SUV max of 13.4. No other hypermetabolic cervical lymph nodes. There is midline hypermetabolic activity involving the base of the epiglottis. This has an SUV max of 5.6. No focal lesion is apparent in this area on the CT images.No other lesions of the pharyngeal mucosal space are seen. CHEST The large left hilar mass is markedly hypermetabolic. This has an SUV max of 22.2 and measures approximately 3.8 x 6.2 cm on image 107. This encases the left upper lobe bronchus and abuts the left mainstem bronchus to the level of the carina. No endobronchial component identified. In addition to the left supraclavicular node, there are small hypermetabolic left paratracheal (SUV max 5.7) and subcarinal (SUV max 3.4) lymph nodes. There is a small hypermetabolic inferior right hilar node (SUV max 2.6). There are no peripheral hypermetabolic nodules within the left lung. There is stable mild pneumonitis within the left upper lobe. The small spiculated right upper lobe nodule is hypermetabolic. This nodule measures 12 x 8 mm on image 97 and has an SUV max of 3.5.  ABDOMEN/PELVIS There is no hypermetabolic activity within the liver, adrenal glands, spleen or pancreas. Specifically, there is no abnormal metabolic activity within the tiny left adrenal nodule. There are no hypermetabolic lymph nodes in the abdomen or pelvis. CT images demonstrate mild aortoiliac atherosclerosis. SKELETON There is no hypermetabolic activity to suggest osseous metastatic disease. IMPRESSION: 1. 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 The left hilar mass demonstrates central extension in the AP window, abutting the left mainstem bronchus to the level of the carina. 2. Contralateral 12 mm hypermetabolic right upper lobe nodule is not morphologically typical for a metastasis and may reflect a synchronous primary cancer. Mildly hypermetabolic inferior right hilar node, nonspecific. 3. No evidence of metastatic disease within the abdomen or pelvis. 4. Indeterminate hypermetabolic activity at the base of  the epiglottis. This could reflect a small mucosal neoplasm or focal inflammation. Direct visualization recommended. Electronically Signed   By: Richardean Sale M.D.   On: 06/10/2017 16:27     I have independently reviewed the above radiologic studies.  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 On focused exam the left scalene node is not palpable. I reviewed the films with the patient and his family recommended that we proceed, completing his MRI of the brain which is scheduled for tomorrow night in proceeding on Friday with bronchoscopy and ebus to obtain a tissue diagnosis.       I  spent 40 minutes counseling the patient face to face and 50% or more the  time was spent in counseling and coordination of care. The total time spent in the appointment was 60 minutes.  Grace Isaac MD      [address] [place] [address] Office [phone]   Beeper [phone]  06/10/2017 4:30 PM

## 2017-06-11 ENCOUNTER — Ambulatory Visit
Admission: RE | Admit: 2017-06-11 | Discharge: 2017-06-11 | Disposition: A | Discharge status: Home / Self Care | Payer: Managed Care, Other (non HMO) | Source: Ambulatory Visit | Attending: Cardiothoracic Surgery | Admitting: Cardiothoracic Surgery

## 2017-06-11 ENCOUNTER — Encounter: Payer: Self-pay | Admitting: Internal Medicine

## 2017-06-11 ENCOUNTER — Encounter (HOSPITAL_COMMUNITY): Payer: Self-pay | Admitting: *Deleted

## 2017-06-11 ENCOUNTER — Other Ambulatory Visit (HOSPITAL_BASED_OUTPATIENT_CLINIC_OR_DEPARTMENT_OTHER): Payer: Managed Care, Other (non HMO)

## 2017-06-11 ENCOUNTER — Ambulatory Visit (HOSPITAL_BASED_OUTPATIENT_CLINIC_OR_DEPARTMENT_OTHER): Payer: Managed Care, Other (non HMO) | Admitting: Internal Medicine

## 2017-06-11 VITALS — BP 120/75 | HR 67 | Temp 98.4°F | Resp 18 | Ht 68.75 in | Wt 165.7 lb

## 2017-06-11 DIAGNOSIS — I6782 Cerebral ischemia: Secondary | ICD-10-CM | POA: Insufficient documentation

## 2017-06-11 DIAGNOSIS — I10 Essential (primary) hypertension: Secondary | ICD-10-CM

## 2017-06-11 DIAGNOSIS — R042 Hemoptysis: Secondary | ICD-10-CM

## 2017-06-11 DIAGNOSIS — F1021 Alcohol dependence, in remission: Secondary | ICD-10-CM

## 2017-06-11 DIAGNOSIS — R918 Other nonspecific abnormal finding of lung field: Secondary | ICD-10-CM

## 2017-06-11 DIAGNOSIS — Z87891 Personal history of nicotine dependence: Secondary | ICD-10-CM | POA: Diagnosis not present

## 2017-06-11 DIAGNOSIS — R599 Enlarged lymph nodes, unspecified: Secondary | ICD-10-CM

## 2017-06-11 DIAGNOSIS — R634 Abnormal weight loss: Secondary | ICD-10-CM | POA: Diagnosis not present

## 2017-06-11 LAB — CBC WITH DIFFERENTIAL/PLATELET
BASO%: 0.2 % (ref 0.0–2.0)
Basophils Absolute: 0 10*3/uL (ref 0.0–0.1)
EOS%: 0.9 % (ref 0.0–7.0)
Eosinophils Absolute: 0.1 10*3/uL (ref 0.0–0.5)
HCT: 42.4 % (ref 38.4–49.9)
HGB: 13.8 g/dL (ref 13.0–17.1)
LYMPH%: 18.1 % (ref 14.0–49.0)
MCH: 32.8 pg (ref 27.2–33.4)
MCHC: 32.5 g/dL (ref 32.0–36.0)
MCV: 100.7 fL — ABNORMAL HIGH (ref 79.3–98.0)
MONO#: 0.6 10*3/uL (ref 0.1–0.9)
MONO%: 7.9 % (ref 0.0–14.0)
NEUT%: 72.9 % (ref 39.0–75.0)
NEUTROS ABS: 5.9 10*3/uL (ref 1.5–6.5)
PLATELETS: 286 10*3/uL (ref 140–400)
RBC: 4.21 10*6/uL (ref 4.20–5.82)
RDW: 12.5 % (ref 11.0–14.6)
WBC: 8.1 10*3/uL (ref 4.0–10.3)
lymph#: 1.5 10*3/uL (ref 0.9–3.3)

## 2017-06-11 LAB — COMPREHENSIVE METABOLIC PANEL
ALT: 16 U/L (ref 0–55)
AST: 16 U/L (ref 5–34)
Albumin: 3.2 g/dL — ABNORMAL LOW (ref 3.5–5.0)
Alkaline Phosphatase: 79 U/L (ref 40–150)
Anion Gap: 5 mEq/L (ref 3–11)
BUN: 6.8 mg/dL — AB (ref 7.0–26.0)
CHLORIDE: 103 meq/L (ref 98–109)
CO2: 32 meq/L — AB (ref 22–29)
CREATININE: 0.8 mg/dL (ref 0.7–1.3)
Calcium: 9.7 mg/dL (ref 8.4–10.4)
EGFR: >90 mL/min/{1.73_m2} (ref >90)
GLUCOSE: 97 mg/dL (ref 70–140)
Potassium: 4.7 mEq/L (ref 3.5–5.1)
SODIUM: 140 meq/L (ref 136–145)
TOTAL PROTEIN: 6.1 g/dL — AB (ref 6.4–8.3)

## 2017-06-11 MED ORDER — GADOBENATE DIMEGLUMINE 529 MG/ML IV SOLN
15.0000 mL | Freq: Once | INTRAVENOUS | Status: AC | PRN
  Administered 2017-06-11: 15 mL via INTRAVENOUS

## 2017-06-11 NOTE — Progress Notes (Signed)
Spoke with pt's wife, Jenny Reichmann for pre-op call. She states pt is too hoarse to talk. She states pt does not have any cardiac history or chest pain. She states pt does not have diabetes.

## 2017-06-11 NOTE — Progress Notes (Signed)
   06/11/17 1443  OBSTRUCTIVE SLEEP APNEA  Have you ever been diagnosed with sleep apnea through a sleep study? No  Do you snore loudly (loud enough to be heard through closed doors)?  1  Do you often feel tired, fatigued, or sleepy during the daytime (such as falling asleep during driving or talking to someone)? 1  Has anyone observed you stop breathing during your sleep? 0  Do you have, or are you being treated for high blood pressure? 1  BMI more than 35 kg/m2? 0  Age > 1 (1-yes) 1  Male Gender (Yes=1) 1  Obstructive Sleep Apnea Score 5  Score 5 or greater  Results sent to PCP

## 2017-06-11 NOTE — Progress Notes (Signed)
[place] [phone]   [phone] Multidisciplinary thoracic oncology clinic  CONSULT NOTE  REFERRING PHYSICIAN: Dr. Martin Majestic  REASON FOR CONSULTATION:  51 years old white male with highly suspicious lung cancer.  HPI Trevor Zavala is a 51 y.o. male with past medical history significant for hypertension, COPD, GERD, depression and anxiety. The patient also has a long history of smoking and alcohol abuse. Has been complaining of increasing shortness breath as well as cough and left-sided chest pain with radiation to the back since middle of July 2018. His condition was getting worse and the patient had an episode of hemoptysis. He presented to his primary care physician order CT scan of the chest and this was performed on 06/05/2017 and it showed left hilar mass highly worrisome for bronchogenic carcinoma. There was narrowing of the left upper lobe bronchus and pulmonary arteries with postobstructive left upper lobe pneumonitis. There was also enlarged mediastinal, hilar and left supraclavicular nodes worrisome for metastatic disease. The patient was seen at the emergency department and CT angiogram of the chest on 06/06/2017 showed no evidence of pulmonary embolism and it showed similar finding to the previous scan. A PET scan was performed on 06/10/2017 and it showed hypermetabolic large left hilar mass with hypermetabolic mediastinal and left supraclavicular lymph nodes consistent with at least a stage IIIB (T2b, N3, M0) bronchogenic carcinoma. The left hilar mass demonstrates central extension in the AP window abutting the left mainstem bronchus to the level of the carina. There was contralateral 1.2 cm hypermetabolic right upper lobe nodule that is not morphologically typical for metastases but may reflect synchronous primary cancer. There was no evidence of metastatic disease within the abdomen or pelvis. There was also indeterminate hypermetabolic  activity at the base of the epiglottis. The patient was referred to the multidisciplinary thoracic oncology clinic today for evaluation and discussion of his treatment options. When seen today he is feeling fine except for hoarseness of his voice is started a week ago. He also has headache. He denied having any chest pain but has shortness breath with exertion and dry cough with no current hemoptysis. He lost around 10 pounds in the last few weeks. He has no nausea, vomiting, diarrhea or constipation. Family history significant for mother with breast and colon cancer. Father had skin and colon cancer. The patient is married and has 1 child and 2 step children. He was accompanied today by his wife Jenny Reichmann. He has a history of smoking up to 2 packs per day for around 40 years and quit few days ago. He also has a history of heavy alcohol drinking on daily basis. No history of drug abuse.  HPI  Past Medical History:  Diagnosis Date  . Anxiety   . COPD (chronic obstructive pulmonary disease) ([place])   . GERD (gastroesophageal reflux disease)   . Headache   . Hypertension   . Pneumonia   . Situational depression     Past Surgical History:  Procedure Laterality Date  . COLONOSCOPY    . EAR CYST EXCISION N/A 11/17/2013   Procedure: SEBACEOUS CYST CHEST;  Surgeon: Joyice Faster. Cornett, MD;  Location: [facility];  Service: General;  Laterality: N/A;  . KNEE ARTHROSCOPY     LEFT  . LIPOMA EXCISION N/A 11/17/2013   Procedure: EXCISION LIPOMA FOREHEAD;  Surgeon: Joyice Faster. Cornett, MD;  Location: [facility];  Service: General;  Laterality: N/A;    Family History  Problem  Relation Age of Onset  . Breast cancer Mother   . Hypertension Mother   . Colon cancer Mother   . Skin cancer Father   . Hypertension Father   . Colon polyps Father   . Cancer Father 45       b cell lymphoma  . Colon cancer Father     Social History Social History  Substance Use Topics  . Smoking  status: Former Smoker    Packs/day: 1.00    Quit date: 06/06/2017  . Smokeless tobacco: Never Used  . Alcohol use 0.0 oz/week     Comment: SOCIAL    No Known Allergies  Current Outpatient Prescriptions  Medication Sig Dispense Refill  . acyclovir (ZOVIRAX) 400 MG tablet TAKE 1 TABLET (400 MG TOTAL) BY MOUTH 3 (THREE) TIMES DAILY. (Patient taking differently: TAKE 1 TABLET (400 MG TOTAL) BY MOUTH 3 (THREE) TIMES DAILY prn outbreak) 21 tablet 3  . aspirin (ASPIRIN CHILDRENS) 81 MG chewable tablet Chew 1 tablet (81 mg total) by mouth daily. 100 tablet 11  . BREO ELLIPTA 100-25 MCG/INH AEPB INHALE 1 PUFF INTO THE LUNGS DAILY 60 each 11  . Cholecalciferol (VITAMIN D PO) Take 1 tablet by mouth daily.    . clonazePAM (KLONOPIN) 0.5 MG tablet Take 1 tablet (0.5 mg total) by mouth 2 (two) times daily as needed for anxiety. (Patient taking differently: Take 0.25 mg by mouth daily. ) 180 tablet 0  . esomeprazole (NEXIUM) 40 MG capsule TAKE 1 CAPSULE BY MOUTH DAILY WITH BREAKFAST 90 capsule 2  . levofloxacin (LEVAQUIN) 750 MG tablet Take 1 tablet (750 mg total) by mouth daily. 5 tablet 0  . loratadine (CLARITIN) 10 MG tablet Take 10 mg by mouth daily as needed for allergies.    . nicotine (NICODERM CQ) 21 mg/24hr patch Place 1 patch (21 mg total) onto the skin daily. 28 patch 0  . valsartan (DIOVAN) 160 MG tablet Take 1 tablet (160 mg total) by mouth daily. 30 tablet 11   No current facility-administered medications for this visit.     Review of Systems  Constitutional: positive for fatigue and weight loss Eyes: negative Ears, nose, mouth, throat, and face: negative Respiratory: positive for cough and dyspnea on exertion Cardiovascular: negative Gastrointestinal: negative Genitourinary:negative Integument/breast: negative Hematologic/lymphatic: negative Musculoskeletal:negative Neurological: positive for headaches Behavioral/Psych: negative Endocrine: negative Allergic/Immunologic:  negative  Physical Exam  EGB:TDVVO, healthy, no distress, well nourished, well developed and anxious SKIN: skin color, texture, turgor are normal, no rashes or significant lesions HEAD: Normocephalic, No masses, lesions, tenderness or abnormalities EYES: normal, PERRLA, Conjunctiva are pink and non-injected EARS: External ears normal, Canals clear OROPHARYNX:no exudate, no erythema and lips, buccal mucosa, and tongue normal  NECK: supple, no adenopathy, no JVD LYMPH:  no palpable lymphadenopathy, no hepatosplenomegaly LUNGS: clear to auscultation , and palpation HEART: regular rate & rhythm, no murmurs and no gallops ABDOMEN:abdomen soft, non-tender, normal bowel sounds and no masses or organomegaly BACK: Back symmetric, no curvature., No CVA tenderness EXTREMITIES:no joint deformities, effusion, or inflammation, no edema, no skin discoloration  NEURO: alert & oriented x 3 with fluent speech, no focal motor/sensory deficits  PERFORMANCE STATUS: ECOG 1  LABORATORY DATA: Lab Results  Component Value Date   WBC 8.1 06/11/2017   HGB 13.8 06/11/2017   HCT 42.4 06/11/2017   MCV 100.7 (H) 06/11/2017   PLT 286 06/11/2017      Chemistry      Component Value Date/Time   NA 138 06/06/2017 1045  K 3.7 06/06/2017 1045   CL 102 06/06/2017 1045   CO2 26 06/06/2017 1023   BUN 5 (L) 06/06/2017 1045   CREATININE 0.60 (L) 06/06/2017 1045      Component Value Date/Time   CALCIUM 9.0 06/06/2017 1023   ALKPHOS 68 06/01/2017 0848   AST 12 06/01/2017 0848   ALT 14 06/01/2017 0848   BILITOT 0.3 06/01/2017 0848       RADIOGRAPHIC STUDIES: Dg Chest 2 View  Result Date: 06/06/2017 CLINICAL DATA:  Shortness of breath, cough, left hilar mass EXAM: CHEST  2 VIEW COMPARISON:  05/27/2017, 06/05/2017 FINDINGS: Mild hyperinflation compatible with background emphysema. Left hilar prominence correlates with the CT from yesterday compatible with the ill-defined infiltrative left hilar/ mediastinal  mass. Streaky lingula bandlike opacity, compatible with atelectasis. Right lung remains clear. No significant interval change. No new focal acute airspace process, significant lung collapse, edema, effusion or pneumothorax. Trachea is midline. Monitor leads overlie the chest. No acute osseous finding. No soft tissue abnormality appreciated IMPRESSION: Mild hilar prominence correlates with the left hilar/ mediastinal infiltrative mass by CT from yesterday. Minor lingula partial collapse/ atelectasis Mild hyperinflation No significant interval change or acute process by plain radiography Electronically Signed   By: Jerilynn Mages.  Shick M.D.   On: 06/06/2017 10:57   Dg Chest 2 View  Result Date: 05/27/2017 CLINICAL DATA:  Cough and congestion for 1 week EXAM: CHEST  2 VIEW COMPARISON:  11/23/2014 FINDINGS: Cardiac shadow is stable. Lungs are hyperinflated bilaterally. No focal infiltrate or sizable effusion is seen. Persistent lingular scarring is noted. No bony abnormality is seen. IMPRESSION: Mild COPD without acute abnormality. Some lingular scarring remains. Electronically Signed   By: Inez Catalina M.D.   On: 05/27/2017 16:56   Ct Chest W Contrast  Result Date: 06/05/2017 CLINICAL DATA:  Hemoptysis 2 weeks ago. Weight loss. Smoker with history of COPD. EXAM: CT CHEST WITH CONTRAST TECHNIQUE: Multidetector CT imaging of the chest was performed during intravenous contrast administration. CONTRAST:  36mL ISOVUE-300 IOPAMIDOL (ISOVUE-300) INJECTION 61% COMPARISON:  Chest radiographs 05/27/2017. Report only from chest CT 04/26/2002 FINDINGS: Cardiovascular: Mild atherosclerosis of the aorta, great vessels and coronary arteries. No evidence of acute pulmonary embolism. There is left pulmonary arterial encasement by tumor, further described below. The heart size is normal. There is no pericardial effusion. Mediastinum/Nodes: There is an irregular left hilar mass, encasing the pulmonary vessels and left upper lobe bronchus.  This mass measures up to 4.9 x 4.1 cm transverse on image 76. There is contiguous left hilar and AP window lymphadenopathy. An AP window node measures up to 2.5 x 3.5 cm on image 64. There is an enlarged left supraclavicular node, measuring 15 mm on coronal image 60. There also prominent right hilar (9 mm, image 76) and subcarinal (9 mm, image 78) nodes. The thyroid gland, trachea and esophagus demonstrate no significant findings. Lungs/Pleura: Trace dependent pleural fluid versus thickening on the left. Mild centrilobular emphysema. As above, there is a large left hilar mass causing narrowing of the left upper lobe bronchus. There is central airway thickening and patchy pneumonitis throughout the left upper lobe, but no lobar collapse. There is a 7 mm left upper lobe pulmonary nodule on image 31. There is another smaller left upper lobe nodule on image 35. No dominant peripheral left lung mass identified. There is an irregular somewhat spiculated right upper lobe nodule measuring 10 x 7 mm on image 49. Upper abdomen: Small nonspecific nodule involving the lateral limb of the  left adrenal gland, measuring 13 x 11 mm on image 166. The right adrenal gland and visualized liver appear unremarkable. Musculoskeletal/Chest wall: There is no chest wall mass or suspicious osseous finding. IMPRESSION: 1. Left hilar mass highly worrisome for bronchogenic carcinoma. There is narrowing of the left upper lobe bronchus and pulmonary arteries with postobstructive left upper lobe pneumonitis. 2. Enlarged mediastinal, hilar and left supraclavicular nodes worrisome for metastatic disease. 3. No dominant peripheral left lung mass identified. There are small left upper lobe nodules and a more worrisome right upper lobe nodule. The latter could reflect a metastasis or synchronous primary. 4. Indeterminate small left adrenal nodule. 5. Tissue sampling recommended. PET-CT may be helpful for further evaluation. 6. These results will be  called to the ordering clinician or representative by the Radiologist Assistant, and communication documented in the PACS or zVision Dashboard. Electronically Signed   By: Richardean Sale M.D.   On: 06/05/2017 14:19   Ct Angio Chest Pe W And/or Wo Contrast  Result Date: 06/06/2017 CLINICAL DATA:  Shortness of breath, cough EXAM: CT ANGIOGRAPHY CHEST WITH CONTRAST TECHNIQUE: Multidetector CT imaging of the chest was performed using the standard protocol during bolus administration of intravenous contrast. Multiplanar CT image reconstructions and MIPs were obtained to evaluate the vascular anatomy. CONTRAST:  80 cc Isovue 370 IV COMPARISON:  Chest x-ray earlier today.  Chest CT 06/05/2017 FINDINGS: Cardiovascular: No filling defects in the pulmonary artery to suggest pulmonary emboli. Atherosclerotic calcifications scattered in the aorta. No evidence of aneurysm. Left pulmonary arterial encasement by tumor as described below. Mediastinum/Nodes: Again noted is the large left hilar mass encasing the pulmonary vessels and left upper lobe bronchus, unchanged since study 2 days ago. AP window adenopathy is stable. No right hilar or axillary adenopathy. Left supraclavicular adenopathy again noted, unchanged. Lungs/Pleura: Airway thickening bilaterally, likely bronchitis. No effusions. Lingular atelectasis. Patchy pneumonitis in the left upper lobe, stable. Previously seen 7 mm nodule in the left upper lobe is not as well visualized but likely present on image 27. Right upper lobe nodule anteriorly on image 47 measures 10 mm, stable. No effusions. Upper Abdomen: Imaging into the upper abdomen shows no acute findings. Musculoskeletal: Chest wall soft tissues are unremarkable. No acute bony abnormality. Review of the MIP images confirms the above findings. IMPRESSION: No evidence of pulmonary embolus. Stable encasement of the left pulmonary vessels and upper lobe bronchus by a irregular left hilar mass. Associated AP  window/mediastinal adenopathy and left supraclavicular adenopathy. Findings are stable since study 2 days ago. Findings highly suspicious for bronchogenic carcinoma. Stable bilateral pulmonary nodules. Stable left upper lobe pneumonitis and diffuse peribronchial thickening. Electronically Signed   By: Rolm Baptise M.D.   On: 06/06/2017 13:49   Nm Pet Image Initial (pi) Skull Base To Thigh  Result Date: 06/10/2017 CLINICAL DATA:  Initial treatment strategy for left upper lobe mass on CT, presumed bronchogenic carcinoma. EXAM: NUCLEAR MEDICINE PET SKULL BASE TO THIGH TECHNIQUE: 12.51 mCi F-18 FDG was injected intravenously. Full-ring PET imaging was performed from the skull base to thigh after the radiotracer. CT data was obtained and used for attenuation correction and anatomic localization. FASTING BLOOD GLUCOSE:  Value: 84 Mg/dl COMPARISON:  Chest CT 06/05/2017 and 06/06/2017. FINDINGS: NECK Hypermetabolic left supraclavicular node measures 16 mm on image 74 and has an SUV max of 13.4. No other hypermetabolic cervical lymph nodes. There is midline hypermetabolic activity involving the base of the epiglottis. This has an SUV max of 5.6. No focal lesion  is apparent in this area on the CT images.No other lesions of the pharyngeal mucosal space are seen. CHEST The large left hilar mass is markedly hypermetabolic. This has an SUV max of 22.2 and measures approximately 3.8 x 6.2 cm on image 107. This encases the left upper lobe bronchus and abuts the left mainstem bronchus to the level of the carina. No endobronchial component identified. In addition to the left supraclavicular node, there are small hypermetabolic left paratracheal (SUV max 5.7) and subcarinal (SUV max 3.4) lymph nodes. There is a small hypermetabolic inferior right hilar node (SUV max 2.6). There are no peripheral hypermetabolic nodules within the left lung. There is stable mild pneumonitis within the left upper lobe. The small spiculated right upper  lobe nodule is hypermetabolic. This nodule measures 12 x 8 mm on image 97 and has an SUV max of 3.5. ABDOMEN/PELVIS There is no hypermetabolic activity within the liver, adrenal glands, spleen or pancreas. Specifically, there is no abnormal metabolic activity within the tiny left adrenal nodule. There are no hypermetabolic lymph nodes in the abdomen or pelvis. CT images demonstrate mild aortoiliac atherosclerosis. SKELETON There is no hypermetabolic activity to suggest osseous metastatic disease. IMPRESSION: 1. 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 The left hilar mass demonstrates central extension in the AP window, abutting the left mainstem bronchus to the level of the carina. 2. Contralateral 12 mm hypermetabolic right upper lobe nodule is not morphologically typical for a metastasis and may reflect a synchronous primary cancer. Mildly hypermetabolic inferior right hilar node, nonspecific. 3. No evidence of metastatic disease within the abdomen or pelvis. 4. Indeterminate hypermetabolic activity at the base of the epiglottis. This could reflect a small mucosal neoplasm or focal inflammation. Direct visualization recommended. Electronically Signed   By: Richardean Sale M.D.   On: 06/10/2017 16:27    ASSESSMENT: This is a very pleasant 51 years old white male with highly suspicious stage IIIB/IV lung cancer, pending tissue diagnosis and presented with large left hilar mass in addition to mediastinal and left supraclavicular lymphadenopathy as well as suspicious contralateral right upper lobe nodule.  PLAN: I had a lengthy discussion with the patient and his wife today about his current disease stage, prognosis and treatment options. I personally and independently reviewed the PET scan images and discuss the results and showed the images to the patient and his wife. I  recommended for the patient to proceed with the MRI of the brain that that is scheduled tomorrow to rule out brain metastasis. The patient is also scheduled for bronchoscopy with endobronchial ultrasound and biopsy under the care of Dr. Servando Snare tomorrow. Depending on the biopsy result as well as the complete staging workup, I will discuss with the patient his treatment options which would be different for small cell lung cancer versus non-small cell lung cancer. I will arrange for the patient to come back for follow-up visit in one week for more detailed discussion of his treatment options. The patient may benefit from a course of concurrent chemoradiation to the disease in the left lung in addition to stereotactic radiation to the contralateral right upper lobe lung nodule if the final tissue diagnosis was consistent with non-small cell lung cancer. He could also be treated as a stage IV with systemic chemotherapy and palliative radiation. For the hoarseness of his voice and the questionable hypermetabolic activity at the base of the epiglottis, I  will refer the patient to ENT for evaluation. For the history of smoking, I strongly encouraged the patient to continue quitting smoking and also decrease the amount of alcohol drinking. The patient was advised to call immediately if he has any concerning symptoms in the interval.  The patient voices understanding of current disease status and treatment options and is in agreement with the current care plan.  All questions were answered. The patient knows to call the clinic with any problems, questions or concerns. We can certainly see the patient much sooner if necessary.  Thank you so much for allowing me to participate in the care of Trevor Zavala. I will continue to follow up the patient with you and assist in his care.  I spent 40 minutes counseling the patient face to face. The total time spent in the appointment was 60 minutes.  Disclaimer: This  note was dictated with voice recognition software. Similar sounding words can inadvertently be transcribed and may not be corrected upon review.   Cullin Dishman K. June 11, 2017, 3:18 PM

## 2017-06-11 NOTE — Anesthesia Preprocedure Evaluation (Addendum)
Anesthesia Evaluation  Patient identified by MRN, date of birth, ID band Patient awake    Reviewed: Allergy & Precautions, NPO status , Patient's Chart, lab work & pertinent test results  Airway Mallampati: II  TM Distance: >3 FB Neck ROM: full    Dental  (+) Teeth Intact, Dental Advidsory Given   Pulmonary COPD, former smoker,    breath sounds clear to auscultation       Cardiovascular hypertension, Pt. on medications  Rhythm:Regular Rate:Normal     Neuro/Psych  Headaches, PSYCHIATRIC DISORDERS Anxiety Depression negative neurological ROS     GI/Hepatic Neg liver ROS, GERD  Medicated and Controlled,  Endo/Other  negative endocrine ROS  Renal/GU negative Renal ROS     Musculoskeletal   Abdominal   Peds  Hematology negative hematology ROS (+)   Anesthesia Other Findings   Reproductive/Obstetrics                           Lab Results  Component Value Date   WBC 8.1 06/11/2017   HGB 13.8 06/11/2017   HCT 42.4 06/11/2017   MCV 100.7 (H) 06/11/2017   PLT 286 06/11/2017   Lab Results  Component Value Date   CREATININE 0.8 06/11/2017   BUN 6.8 (L) 06/11/2017   NA 140 06/11/2017   K 4.7 06/11/2017   CL 102 06/06/2017   CO2 32 (H) 06/11/2017    Anesthesia Physical Anesthesia Plan  ASA: III  Anesthesia Plan: General   Post-op Pain Management:    Induction: Intravenous  PONV Risk Score and Plan: 2 and Ondansetron and Dexamethasone  Airway Management Planned: Oral ETT  Additional Equipment:   Intra-op Plan:   Post-operative Plan: Extubation in OR  Informed Consent: I have reviewed the patients History and Physical, chart, labs and discussed the procedure including the risks, benefits and alternatives for the proposed anesthesia with the patient or authorized representative who has indicated his/her understanding and acceptance.   Dental Advisory Given  Plan Discussed with:  CRNA  Anesthesia Plan Comments:       Anesthesia Quick Evaluation

## 2017-06-12 ENCOUNTER — Ambulatory Visit (HOSPITAL_COMMUNITY): Payer: Managed Care, Other (non HMO)

## 2017-06-12 ENCOUNTER — Ambulatory Visit (HOSPITAL_COMMUNITY)
Admission: RE | Admit: 2017-06-12 | Discharge: 2017-06-12 | Disposition: A | Discharge status: Home / Self Care | Payer: Managed Care, Other (non HMO) | Source: Ambulatory Visit | Attending: Cardiothoracic Surgery | Admitting: Cardiothoracic Surgery

## 2017-06-12 ENCOUNTER — Ambulatory Visit (HOSPITAL_COMMUNITY): Payer: Managed Care, Other (non HMO) | Admitting: Anesthesiology

## 2017-06-12 ENCOUNTER — Encounter (HOSPITAL_COMMUNITY)
Admission: RE | Disposition: A | Discharge status: Home / Self Care | Payer: Self-pay | Source: Ambulatory Visit | Attending: Cardiothoracic Surgery

## 2017-06-12 ENCOUNTER — Telehealth: Payer: Self-pay | Admitting: Thoracic Surgery (Cardiothoracic Vascular Surgery)

## 2017-06-12 ENCOUNTER — Encounter (HOSPITAL_COMMUNITY): Payer: Self-pay | Admitting: Anesthesiology

## 2017-06-12 DIAGNOSIS — Z803 Family history of malignant neoplasm of breast: Secondary | ICD-10-CM | POA: Insufficient documentation

## 2017-06-12 DIAGNOSIS — F419 Anxiety disorder, unspecified: Secondary | ICD-10-CM | POA: Diagnosis not present

## 2017-06-12 DIAGNOSIS — K219 Gastro-esophageal reflux disease without esophagitis: Secondary | ICD-10-CM | POA: Insufficient documentation

## 2017-06-12 DIAGNOSIS — I700 Atherosclerosis of aorta: Secondary | ICD-10-CM | POA: Diagnosis not present

## 2017-06-12 DIAGNOSIS — F3289 Other specified depressive episodes: Secondary | ICD-10-CM | POA: Diagnosis not present

## 2017-06-12 DIAGNOSIS — I10 Essential (primary) hypertension: Secondary | ICD-10-CM | POA: Diagnosis not present

## 2017-06-12 DIAGNOSIS — R918 Other nonspecific abnormal finding of lung field: Secondary | ICD-10-CM

## 2017-06-12 DIAGNOSIS — Z87891 Personal history of nicotine dependence: Secondary | ICD-10-CM | POA: Insufficient documentation

## 2017-06-12 DIAGNOSIS — Z800 Family history of malignant neoplasm of digestive organs: Secondary | ICD-10-CM | POA: Diagnosis not present

## 2017-06-12 DIAGNOSIS — Z808 Family history of malignant neoplasm of other organs or systems: Secondary | ICD-10-CM | POA: Insufficient documentation

## 2017-06-12 DIAGNOSIS — R490 Dysphonia: Secondary | ICD-10-CM | POA: Diagnosis not present

## 2017-06-12 DIAGNOSIS — C3431 Malignant neoplasm of lower lobe, right bronchus or lung: Secondary | ICD-10-CM | POA: Insufficient documentation

## 2017-06-12 DIAGNOSIS — Z8371 Family history of colonic polyps: Secondary | ICD-10-CM | POA: Diagnosis not present

## 2017-06-12 DIAGNOSIS — C3412 Malignant neoplasm of upper lobe, left bronchus or lung: Secondary | ICD-10-CM | POA: Insufficient documentation

## 2017-06-12 DIAGNOSIS — R51 Headache: Secondary | ICD-10-CM | POA: Diagnosis not present

## 2017-06-12 DIAGNOSIS — J449 Chronic obstructive pulmonary disease, unspecified: Secondary | ICD-10-CM | POA: Insufficient documentation

## 2017-06-12 DIAGNOSIS — Z807 Family history of other malignant neoplasms of lymphoid, hematopoietic and related tissues: Secondary | ICD-10-CM | POA: Insufficient documentation

## 2017-06-12 DIAGNOSIS — Z8249 Family history of ischemic heart disease and other diseases of the circulatory system: Secondary | ICD-10-CM | POA: Diagnosis not present

## 2017-06-12 HISTORY — DX: Anxiety disorder, unspecified: F41.9

## 2017-06-12 HISTORY — DX: Headache: R51

## 2017-06-12 HISTORY — DX: Headache, unspecified: R51.9

## 2017-06-12 HISTORY — DX: Essential (primary) hypertension: I10

## 2017-06-12 HISTORY — DX: Pneumonia, unspecified organism: J18.9

## 2017-06-12 HISTORY — PX: LUNG BIOPSY: SHX5088

## 2017-06-12 HISTORY — PX: VIDEO BRONCHOSCOPY WITH ENDOBRONCHIAL ULTRASOUND: SHX6177

## 2017-06-12 LAB — APTT: aPTT: 32 seconds (ref 24–36)

## 2017-06-12 LAB — PROTIME-INR
INR: 0.96
Prothrombin Time: 12.8 seconds (ref 11.4–15.2)

## 2017-06-12 SURGERY — BRONCHOSCOPY, WITH EBUS
Anesthesia: General | Site: Chest

## 2017-06-12 MED ORDER — PROMETHAZINE HCL 25 MG/ML IJ SOLN: 6.2500 mg | INTRAMUSCULAR | Status: DC | PRN

## 2017-06-12 MED ORDER — LIDOCAINE 2% (20 MG/ML) 5 ML SYRINGE
INTRAMUSCULAR | Status: AC
  Filled 2017-06-12: qty 10

## 2017-06-12 MED ORDER — LIDOCAINE HCL (CARDIAC) 20 MG/ML IV SOLN
INTRAVENOUS | Status: DC | PRN
  Administered 2017-06-12: 60 mg via INTRAVENOUS

## 2017-06-12 MED ORDER — PHENYLEPHRINE HCL 10 MG/ML IJ SOLN
INTRAMUSCULAR | Status: DC | PRN
  Administered 2017-06-12: 40 ug via INTRAVENOUS

## 2017-06-12 MED ORDER — ROCURONIUM BROMIDE 100 MG/10ML IV SOLN
INTRAVENOUS | Status: DC | PRN
  Administered 2017-06-12: 10 mg via INTRAVENOUS
  Administered 2017-06-12: 40 mg via INTRAVENOUS
  Administered 2017-06-12: 10 mg via INTRAVENOUS

## 2017-06-12 MED ORDER — FENTANYL CITRATE (PF) 100 MCG/2ML IJ SOLN
INTRAMUSCULAR | Status: DC | PRN
  Administered 2017-06-12 (×5): 50 ug via INTRAVENOUS

## 2017-06-12 MED ORDER — ONDANSETRON HCL 4 MG/2ML IJ SOLN
INTRAMUSCULAR | Status: DC | PRN
  Administered 2017-06-12: 4 mg via INTRAVENOUS

## 2017-06-12 MED ORDER — LACTATED RINGERS IV SOLN
INTRAVENOUS | Status: DC | PRN
  Administered 2017-06-12 (×2): via INTRAVENOUS

## 2017-06-12 MED ORDER — MIDAZOLAM HCL 2 MG/2ML IJ SOLN
INTRAMUSCULAR | Status: AC
  Filled 2017-06-12: qty 2

## 2017-06-12 MED ORDER — SUGAMMADEX SODIUM 200 MG/2ML IV SOLN
INTRAVENOUS | Status: AC
  Filled 2017-06-12: qty 10

## 2017-06-12 MED ORDER — 0.9 % SODIUM CHLORIDE (POUR BTL) OPTIME
TOPICAL | Status: DC | PRN
  Administered 2017-06-12: 1000 mL

## 2017-06-12 MED ORDER — CEFAZOLIN SODIUM-DEXTROSE 2-4 GM/100ML-% IV SOLN
INTRAVENOUS | Status: AC
  Filled 2017-06-12: qty 100

## 2017-06-12 MED ORDER — PHENYLEPHRINE 40 MCG/ML (10ML) SYRINGE FOR IV PUSH (FOR BLOOD PRESSURE SUPPORT)
PREFILLED_SYRINGE | INTRAVENOUS | Status: AC
  Filled 2017-06-12: qty 10

## 2017-06-12 MED ORDER — LIDOCAINE 2% (20 MG/ML) 5 ML SYRINGE
INTRAMUSCULAR | Status: AC
  Filled 2017-06-12: qty 15

## 2017-06-12 MED ORDER — PROPOFOL 10 MG/ML IV BOLUS
INTRAVENOUS | Status: AC
  Filled 2017-06-12: qty 20

## 2017-06-12 MED ORDER — CEFAZOLIN SODIUM-DEXTROSE 2-3 GM-% IV SOLR
INTRAVENOUS | Status: DC | PRN
  Administered 2017-06-12: 2 g via INTRAVENOUS

## 2017-06-12 MED ORDER — PROPOFOL 10 MG/ML IV BOLUS
INTRAVENOUS | Status: DC | PRN
  Administered 2017-06-12: 150 mg via INTRAVENOUS

## 2017-06-12 MED ORDER — EPINEPHRINE PF 1 MG/ML IJ SOLN
INTRAMUSCULAR | Status: DC | PRN
Start: 2017-06-12 — End: 2017-06-12
  Administered 2017-06-12: 1 mg

## 2017-06-12 MED ORDER — ONDANSETRON HCL 4 MG/2ML IJ SOLN
INTRAMUSCULAR | Status: AC
  Filled 2017-06-12: qty 2

## 2017-06-12 MED ORDER — DEXAMETHASONE SODIUM PHOSPHATE 10 MG/ML IJ SOLN
INTRAMUSCULAR | Status: AC
  Filled 2017-06-12: qty 3

## 2017-06-12 MED ORDER — DEXAMETHASONE SODIUM PHOSPHATE 10 MG/ML IJ SOLN
INTRAMUSCULAR | Status: DC | PRN
  Administered 2017-06-12: 10 mg via INTRAVENOUS

## 2017-06-12 MED ORDER — MIDAZOLAM HCL 5 MG/5ML IJ SOLN
INTRAMUSCULAR | Status: DC | PRN
  Administered 2017-06-12: 2 mg via INTRAVENOUS

## 2017-06-12 MED ORDER — FENTANYL CITRATE (PF) 250 MCG/5ML IJ SOLN
INTRAMUSCULAR | Status: AC
  Filled 2017-06-12: qty 5

## 2017-06-12 MED ORDER — SUGAMMADEX SODIUM 200 MG/2ML IV SOLN
INTRAVENOUS | Status: DC | PRN
  Administered 2017-06-12: 200 mg via INTRAVENOUS

## 2017-06-12 MED ORDER — ROCURONIUM BROMIDE 10 MG/ML (PF) SYRINGE
PREFILLED_SYRINGE | INTRAVENOUS | Status: AC
  Filled 2017-06-12: qty 10

## 2017-06-12 MED ORDER — LABETALOL HCL 5 MG/ML IV SOLN
INTRAVENOUS | Status: AC
  Filled 2017-06-12: qty 4

## 2017-06-12 MED ORDER — EPINEPHRINE PF 1 MG/ML IJ SOLN
INTRAMUSCULAR | Status: AC
  Filled 2017-06-12: qty 1

## 2017-06-12 MED ORDER — ONDANSETRON HCL 4 MG/2ML IJ SOLN
INTRAMUSCULAR | Status: AC
  Filled 2017-06-12: qty 6

## 2017-06-12 MED ORDER — DEXAMETHASONE SODIUM PHOSPHATE 10 MG/ML IJ SOLN
INTRAMUSCULAR | Status: AC
  Filled 2017-06-12: qty 1

## 2017-06-12 SURGICAL SUPPLY — 28 items
BRUSH CYTOL CELLEBRITY 1.5X140 (MISCELLANEOUS) IMPLANT
CANISTER SUCT 3000ML PPV (MISCELLANEOUS) ×3 IMPLANT
CONT SPEC 4OZ CLIKSEAL STRL BL (MISCELLANEOUS) ×3 IMPLANT
COVER BACK TABLE 60X90IN (DRAPES) ×3 IMPLANT
COVER DOME SNAP 22 D (MISCELLANEOUS) ×3 IMPLANT
FORCEPS BIOP RJ4 1.8 (CUTTING FORCEPS) ×1 IMPLANT
GAUZE SPONGE 4X4 12PLY STRL (GAUZE/BANDAGES/DRESSINGS) ×3 IMPLANT
GAUZE SPONGE 4X4 16PLY XRAY LF (GAUZE/BANDAGES/DRESSINGS) ×1 IMPLANT
GLOVE BIO SURGEON STRL SZ 6.5 (GLOVE) ×3 IMPLANT
GLOVE SURG SS PI 6.0 STRL IVOR (GLOVE) ×1 IMPLANT
GOWN STRL REUS W/ TWL LRG LVL3 (GOWN DISPOSABLE) IMPLANT
GOWN STRL REUS W/TWL LRG LVL3 (GOWN DISPOSABLE) ×6
KIT CLEAN ENDO COMPLIANCE (KITS) ×8 IMPLANT
KIT ROOM TURNOVER OR (KITS) ×3 IMPLANT
MARKER SKIN DUAL TIP RULER LAB (MISCELLANEOUS) ×3 IMPLANT
NDL BLUNT 18X1 FOR OR ONLY (NEEDLE) IMPLANT
NDL EBUS SONO TIP PENTAX (NEEDLE) ×2 IMPLANT
NEEDLE BLUNT 18X1 FOR OR ONLY (NEEDLE) IMPLANT
NEEDLE EBUS SONO TIP PENTAX (NEEDLE) ×3 IMPLANT
NS IRRIG 1000ML POUR BTL (IV SOLUTION) ×3 IMPLANT
OIL SILICONE PENTAX (PARTS (SERVICE/REPAIRS)) ×3 IMPLANT
PAD ARMBOARD 7.5X6 YLW CONV (MISCELLANEOUS) ×6 IMPLANT
SYR 20CC LL (SYRINGE) ×3 IMPLANT
SYR 20ML ECCENTRIC (SYRINGE) ×3 IMPLANT
TOWEL OR 17X24 6PK STRL BLUE (TOWEL DISPOSABLE) ×3 IMPLANT
TRAP SPECIMEN MUCOUS 40CC (MISCELLANEOUS) ×3 IMPLANT
TUBE CONNECTING 20X1/4 (TUBING) ×3 IMPLANT
WATER STERILE IRR 1000ML POUR (IV SOLUTION) ×3 IMPLANT

## 2017-06-12 NOTE — Anesthesia Procedure Notes (Signed)
Procedure Name: Intubation Date/Time: 06/12/2017 7:28 AM Performed by: Neldon Newport Pre-anesthesia Checklist: Timeout performed, Patient being monitored, Suction available, Emergency Drugs available and Patient identified Patient Re-evaluated:Patient Re-evaluated prior to induction Oxygen Delivery Method: Circle system utilized Preoxygenation: Pre-oxygenation with 100% oxygen Induction Type: IV induction Ventilation: Mask ventilation without difficulty Laryngoscope Size: Mac and 3 Grade View: Grade II Tube type: Oral Tube size: 8.5 mm Number of attempts: 2 Placement Confirmation: breath sounds checked- equal and bilateral,  positive ETCO2 and ETT inserted through vocal cords under direct vision Secured at: 22 cm Tube secured with: Tape Dental Injury: Teeth and Oropharynx as per pre-operative assessment

## 2017-06-12 NOTE — Transfer of Care (Signed)
Immediate Anesthesia Transfer of Care Note  Patient: Trevor Zavala  Procedure(s) Performed: Procedure(s): VIDEO BRONCHOSCOPY WITH ENDOBRONCHIAL ULTRASOUND (N/A) LEFT LUNG BIOPSY (Left)  Patient Location: PACU  Anesthesia Type:General  Level of Consciousness: awake, alert  and oriented  Airway & Oxygen Therapy: Patient Spontanous Breathing and Patient connected to nasal cannula oxygen  Post-op Assessment: Report given to RN and Post -op Vital signs reviewed and stable  Post vital signs: Reviewed and stable  Last Vitals:  Vitals:   06/12/17 0637  BP: 124/78  Pulse: 68  Resp: 18  Temp: 36.8 C    Last Pain:  Vitals:   06/12/17 0658  TempSrc:   PainSc: 4       Patients Stated Pain Goal: 5 (45/40/98 1191)  Complications: No apparent anesthesia complications

## 2017-06-12 NOTE — Brief Op Note (Addendum)
      [address]       ,[place] [address]             [phone]      06/12/2017  8:49 AM  PATIENT:  Trevor Zavala  51 y.o. male  PRE-OPERATIVE DIAGNOSIS:  LUNG LESION  POST-OPERATIVE DIAGNOSIS:  LUNG LESION- quick stain malignant   PROCEDURE:  Procedure(s): VIDEO BRONCHOSCOPY WITH ENDOBRONCHIAL ULTRASOUND (N/A) LEFT LUNG BIOPSY (Left)  SURGEON:  Surgeon(s) and Role:    * Grace Isaac, MD - Primary   ANESTHESIA:   general  EBL:  Total I/O In: 1000 [I.V.:1000] Out: 20 [Blood:20]  BLOOD ADMINISTERED:none  DRAINS: none   LOCAL MEDICATIONS USED:  NONE  SPECIMEN:  Source of Specimen:  left main sten bronchus, left upper lobe brochus, 4l node, right loer lobe bronchus   DISPOSITION OF SPECIMEN:  PATHOLOGY  COUNTS:  YES  DICTATION: .Dragon Dictation  PLAN OF CARE: Discharge to home after PACU  PATIENT DISPOSITION:  PACU - hemodynamically stable.   Delay start of Pharmacological VTE agent (>24hrs) due to surgical blood loss or risk of bleeding: yes

## 2017-06-12 NOTE — H&P (Signed)
Trevor Zavala       [name] [address]             [id number]                    Trevor Zavala [facility] Medical Record #[id number] Date of Birth: 08-17-1966  Referring: Cassandria Anger, MD Primary Care: Cassandria Anger, MD  Chief Complaint:        Chief Complaint  Patient presents with  . Lung Lesion    Surgical eval, Chest CTA 06/06/17, PET Sacn 8    History of Present Illness:    Trevor Zavala 51 y.o. male is seen in the office  today for Left lung mass. The patient has been a long-term smoker for more than 30 years. About 5 weeks ago he had an episode of hemoptysis 1. He denies any fever or chills but has had some night sweats. He notes a 37 pound weight loss over the past 12 months. Last week he had a CT scan of the chest, which was abnormal. The following day he had episode of left upper chest pain and went to the emergency room and a second CT of the chest was performed.  Over the past several days he's had increasing hoarseness noted by himself and his family. Patient also notes several days of bifrontal headaches.      Current Activity/ Functional Status:  Patient is independent with mobility/ambulation, transfers, ADL's, IADL's.   Zubrod Score: At the time of surgery this patient's most appropriate activity status/level should be described as: [x]     0    Normal activity, no symptoms []     1    Restricted in physical strenuous activity but ambulatory, able to do out light work []     2    Ambulatory and capable of self care, unable to do work activities, up and about               >50 % of waking hours                              []     3    Only limited self care, in bed greater than 50% of waking hours []     4    Completely disabled, no self care, confined to bed or chair []     5    Moribund   Past Medical History:  Diagnosis Date  . Anxiety   . COPD (chronic obstructive pulmonary disease) ([place])   . GERD (gastroesophageal  reflux disease)   . Headache   . Hypertension   . Pneumonia   . Situational depression     Past Surgical History:  Procedure Laterality Date  . COLONOSCOPY    . EAR CYST EXCISION N/A 11/17/2013   Procedure: SEBACEOUS CYST CHEST;  Surgeon: Joyice Faster. Cornett, MD;  Location: [facility];  Service: General;  Laterality: N/A;  . KNEE ARTHROSCOPY     LEFT  . LIPOMA EXCISION N/A 11/17/2013   Procedure: EXCISION LIPOMA FOREHEAD;  Surgeon: Joyice Faster. Cornett, MD;  Location: [facility];  Service: General;  Laterality: N/A;    Family History  Problem Relation Age of Onset  . Breast cancer Mother   . Hypertension Mother   . Colon cancer Mother   . Skin cancer Father   . Hypertension Father   .  Colon polyps Father   . Cancer Father 93       b cell lymphoma  . Colon cancer Father     Social History   Social History  . Marital status: Married    Spouse name: N/A  . Number of children: N/A  . Years of education: N/A   Occupational History  . 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 Smoking status: Former Smoker    Packs/day: 1.00  . Smokeless tobacco: Never Used  . Alcohol use 0.0 oz/week     Comment: SOCIAL  . Drug use: No  . Sexual activity: Yes   Other Topics Concern  . Not on file   Social History Narrative   Daily Caffeine Use:  3    History  Smoking Status  . Former Smoker  . Packs/day: 1.00  . Quit date: 06/06/2017  Smokeless Tobacco  . Never Used    History  Alcohol Use  . 0.0 oz/week    Comment: SOCIAL     No Known Allergies  Current Facility-Administered Medications  Medication Dose Route Frequency Provider Last Rate Last Dose  . ceFAZolin (ANCEF) 2-4 GM/100ML-% IVPB             Pertinent items are noted in HPI.   Review of Systems:     Cardiac Review of Systems: Y or N  Chest Pain [ y   ]  Resting SOB [n   ] Exertional SOB  [ y ]  Orthopnea [ n ]   Pedal Edema  [n   ]    Palpitations [ n ] Syncope  [  n]   Presyncope [ n  ]  General Review of Systems: [Y] = yes [  ]=no Constitional: recent weight change y[  ];  Wt loss over the last 3 months [   ] anorexia [  ]; fatigue [  ]; nausea [  ]; night sweats [  ]; fever [  ]; or chills [  ];          Dental: poor dentition[ n ]; Last Dentist visit:   Eye : blurred vision [  ]; diplopia [   ]; vision changes [  ];  Amaurosis fugax[  ]; Resp: cough Blue.Reese  ];  wheezing[y  ];  hemoptysis[y  ]; shortness of breath[ n ]; paroxysmal nocturnal dyspnea[n  ]; dyspnea on exertion[ n ]; or orthopnea[  ];  GI:  gallstones[  ], vomiting[  ];  dysphagia[  ]; melena[  ];  hematochezia [  ]; heartburn[  ];   Hx of  Colonoscopy[  ]; GU: kidney stones [  ]; hematuria[  ];   dysuria [  ];  nocturia[  ];  history of     obstruction [  ]; urinary frequency [  ]             Skin: rash, swelling[  ];, hair loss[  ];  peripheral edema[  ];  or itching[  ]; Musculosketetal: myalgias[  ];  joint swelling[  ];  joint erythema[n  ];  joint pain[  ];  back pain[ n ];  Heme/Lymph: bruising[  ];  bleeding[  ];  anemia[  ];  Neuro: TIA[n  ];  headaches[n  ];  stroke[n  ];  vertigo[n  ];  seizures[ n ];   paresthesias[  ];  difficulty walking[n  ];  Psych:depression[  ]; anxiety[  ];  Endocrine: diabetes[  ];  thyroid dysfunction[  ];  Immunizations: Flu up to date [n  ]; Pneumococcal up to date [n  ];  Other:  Physical Exam: BP 124/78   Pulse 68   Temp 98.2 F (36.8 C) (Oral)   Resp 18   Wt 165 lb (74.8 kg)   SpO2 98%   BMI 24.54 kg/m   PHYSICAL EXAMINATION: General appearance: alert, cooperative, appears older than stated age and no distress Head: Normocephalic, without obvious abnormality, atraumatic Neck: no adenopathy, no carotid bruit, no JVD, supple, symmetrical, trachea midline and thyroid not enlarged, symmetric, no tenderness/mass/nodules Lymph nodes: Cervical, supraclavicular, and axillary nodes normal. Resp: clear to  auscultation bilaterally Back: symmetric, no curvature. ROM normal. No CVA tenderness. Cardio: regular rate and rhythm, S1, S2 normal, no murmur, click, rub or gallop GI: soft, non-tender; bowel sounds normal; no masses,  no organomegaly Extremities: extremities normal, atraumatic, no cyanosis or edema and Homans sign is negative, no sign of DVT Neurologic: Grossly normal  Diagnostic Studies & Laboratory data:     Recent Radiology Findings: Mr Jeri Cos Wo Contrast  Result Date: 06/11/2017 CLINICAL DATA:  51 y/o M; possible lung cancer, biopsy to confirm 06/12/2017, evaluate for intracranial metastasis. EXAM: MRI HEAD WITHOUT AND WITH CONTRAST TECHNIQUE: Multiplanar, multiecho pulse sequences of the brain and surrounding structures were obtained without and with intravenous contrast. CONTRAST:  82mL MULTIHANCE GADOBENATE DIMEGLUMINE 529 MG/ML IV SOLN COMPARISON:  None. FINDINGS: Brain: No acute infarction, hemorrhage, hydrocephalus, extra-axial collection or mass lesion. Few scattered nonspecific punctate foci of T2 FLAIR hyperintense signal abnormality in subcortical white matter is consistent with mild chronic microvascular ischemic changes. Mild brain parenchymal volume loss. No abnormal enhancement of the brain. Vascular: Normal flow voids. Skull and upper cervical spine: Normal marrow signal. No abnormal enhancement. Sinuses/Orbits: Mild paranasal sinus mucosal thickening and small left maxillary sinus mucous retention cyst. No abnormal signal of mastoid air cells. Normal orbits. Other: None. IMPRESSION: 1. No intracranial metastatic disease identified. 2. Mild chronic microvascular ischemic changes and mild parenchymal volume loss of the brain. Electronically Signed   By: Kristine Garbe M.D.   On: 06/11/2017 22:10     Dg Chest 2 View  Result Date: 06/06/2017 CLINICAL DATA:  Shortness of breath, cough, left hilar mass EXAM: CHEST  2 VIEW COMPARISON:  05/27/2017, 06/05/2017 FINDINGS: Mild  hyperinflation compatible with background emphysema. Left hilar prominence correlates with the CT from yesterday compatible with the ill-defined infiltrative left hilar/ mediastinal mass. Streaky lingula bandlike opacity, compatible with atelectasis. Right lung remains clear. No significant interval change. No new focal acute airspace process, significant lung collapse, edema, effusion or pneumothorax. Trachea is midline. Monitor leads overlie the chest. No acute osseous finding. No soft tissue abnormality appreciated IMPRESSION: Mild hilar prominence correlates with the left hilar/ mediastinal infiltrative mass by CT from yesterday. Minor lingula partial collapse/ atelectasis Mild hyperinflation No significant interval change or acute process by plain radiography Electronically Signed   By: Jerilynn Mages.  Shick M.D.   On: 06/06/2017 10:57   Dg Chest 2 View  Result Date: 05/27/2017 CLINICAL DATA:  Cough and congestion for 1 week EXAM: CHEST  2 VIEW COMPARISON:  11/23/2014 FINDINGS: Cardiac shadow is stable. Lungs are hyperinflated bilaterally. No focal infiltrate or sizable effusion is seen. Persistent lingular scarring is noted. No bony abnormality is seen. IMPRESSION: Mild COPD without acute abnormality. Some lingular scarring remains. Electronically Signed   By: Inez Catalina M.D.   On: 05/27/2017 16:56  Ct Chest W Contrast  Result Date: 06/05/2017 CLINICAL DATA:  Hemoptysis 2 weeks ago. Weight loss. Smoker with history of COPD. EXAM: CT CHEST WITH CONTRAST TECHNIQUE: Multidetector CT imaging of the chest was performed during intravenous contrast administration. CONTRAST:  3mL ISOVUE-300 IOPAMIDOL (ISOVUE-300) INJECTION 61% COMPARISON:  Chest radiographs 05/27/2017. Report only from chest CT 04/26/2002 FINDINGS: Cardiovascular: Mild atherosclerosis of the aorta, great vessels and coronary arteries. No evidence of acute pulmonary embolism. There is left pulmonary arterial encasement by tumor, further described  below. The heart size is normal. There is no pericardial effusion. Mediastinum/Nodes: There is an irregular left hilar mass, encasing the pulmonary vessels and left upper lobe bronchus. This mass measures up to 4.9 x 4.1 cm transverse on image 76. There is contiguous left hilar and AP window lymphadenopathy. An AP window node measures up to 2.5 x 3.5 cm on image 64. There is an enlarged left supraclavicular node, measuring 15 mm on coronal image 60. There also prominent right hilar (9 mm, image 76) and subcarinal (9 mm, image 78) nodes. The thyroid gland, trachea and esophagus demonstrate no significant findings. Lungs/Pleura: Trace dependent pleural fluid versus thickening on the left. Mild centrilobular emphysema. As above, there is a large left hilar mass causing narrowing of the left upper lobe bronchus. There is central airway thickening and patchy pneumonitis throughout the left upper lobe, but no lobar collapse. There is a 7 mm left upper lobe pulmonary nodule on image 31. There is another smaller left upper lobe nodule on image 35. No dominant peripheral left lung mass identified. There is an irregular somewhat spiculated right upper lobe nodule measuring 10 x 7 mm on image 49. Upper abdomen: Small nonspecific nodule involving the lateral limb of the left adrenal gland, measuring 13 x 11 mm on image 166. The right adrenal gland and visualized liver appear unremarkable. Musculoskeletal/Chest wall: There is no chest wall mass or suspicious osseous finding. IMPRESSION: 1. Left hilar mass highly worrisome for bronchogenic carcinoma. There is narrowing of the left upper lobe bronchus and pulmonary arteries with postobstructive left upper lobe pneumonitis. 2. Enlarged mediastinal, hilar and left supraclavicular nodes worrisome for metastatic disease. 3. No dominant peripheral left lung mass identified. There are small left upper lobe nodules and a more worrisome right upper lobe nodule. The latter could reflect a  metastasis or synchronous primary. 4. Indeterminate small left adrenal nodule. 5. Tissue sampling recommended. PET-CT may be helpful for further evaluation. 6. These results will be called to the ordering clinician or representative by the Radiologist Assistant, and communication documented in the PACS or zVision Dashboard. Electronically Signed   By: Richardean Sale M.D.   On: 06/05/2017 14:19   Ct Angio Chest Pe W And/or Wo Contrast  Result Date: 06/06/2017 CLINICAL DATA:  Shortness of breath, cough EXAM: CT ANGIOGRAPHY CHEST WITH CONTRAST TECHNIQUE: Multidetector CT imaging of the chest was performed using the standard protocol during bolus administration of intravenous contrast. Multiplanar CT image reconstructions and MIPs were obtained to evaluate the vascular anatomy. CONTRAST:  80 cc Isovue 370 IV COMPARISON:  Chest x-ray earlier today.  Chest CT 06/05/2017 FINDINGS: Cardiovascular: No filling defects in the pulmonary artery to suggest pulmonary emboli. Atherosclerotic calcifications scattered in the aorta. No evidence of aneurysm. Left pulmonary arterial encasement by tumor as described below. Mediastinum/Nodes: Again noted is the large left hilar mass encasing the pulmonary vessels and left upper lobe bronchus, unchanged since study 2 days ago. AP window adenopathy is stable. No right hilar  or axillary adenopathy. Left supraclavicular adenopathy again noted, unchanged. Lungs/Pleura: Airway thickening bilaterally, likely bronchitis. No effusions. Lingular atelectasis. Patchy pneumonitis in the left upper lobe, stable. Previously seen 7 mm nodule in the left upper lobe is not as well visualized but likely present on image 27. Right upper lobe nodule anteriorly on image 47 measures 10 mm, stable. No effusions. Upper Abdomen: Imaging into the upper abdomen shows no acute findings. Musculoskeletal: Chest wall soft tissues are unremarkable. No acute bony abnormality. Review of the MIP images confirms the  above findings. IMPRESSION: No evidence of pulmonary embolus. Stable encasement of the left pulmonary vessels and upper lobe bronchus by a irregular left hilar mass. Associated AP window/mediastinal adenopathy and left supraclavicular adenopathy. Findings are stable since study 2 days ago. Findings highly suspicious for bronchogenic carcinoma. Stable bilateral pulmonary nodules. Stable left upper lobe pneumonitis and diffuse peribronchial thickening. Electronically Signed   By: Rolm Baptise M.D.   On: 06/06/2017 13:49   Nm Pet Image Initial (pi) Skull Base To Thigh  Result Date: 06/10/2017 CLINICAL DATA:  Initial treatment strategy for left upper lobe mass on CT, presumed bronchogenic carcinoma. EXAM: NUCLEAR MEDICINE PET SKULL BASE TO THIGH TECHNIQUE: 12.51 mCi F-18 FDG was injected intravenously. Full-ring PET imaging was performed from the skull base to thigh after the radiotracer. CT data was obtained and used for attenuation correction and anatomic localization. FASTING BLOOD GLUCOSE:  Value: 84 Mg/dl COMPARISON:  Chest CT 06/05/2017 and 06/06/2017. FINDINGS: NECK Hypermetabolic left supraclavicular node measures 16 mm on image 74 and has an SUV max of 13.4. No other hypermetabolic cervical lymph nodes. There is midline hypermetabolic activity involving the base of the epiglottis. This has an SUV max of 5.6. No focal lesion is apparent in this area on the CT images.No other lesions of the pharyngeal mucosal space are seen. CHEST The large left hilar mass is markedly hypermetabolic. This has an SUV max of 22.2 and measures approximately 3.8 x 6.2 cm on image 107. This encases the left upper lobe bronchus and abuts the left mainstem bronchus to the level of the carina. No endobronchial component identified. In addition to the left supraclavicular node, there are small hypermetabolic left paratracheal (SUV max 5.7) and subcarinal (SUV max 3.4) lymph nodes. There is a small hypermetabolic inferior right hilar  node (SUV max 2.6). There are no peripheral hypermetabolic nodules within the left lung. There is stable mild pneumonitis within the left upper lobe. The small spiculated right upper lobe nodule is hypermetabolic. This nodule measures 12 x 8 mm on image 97 and has an SUV max of 3.5. ABDOMEN/PELVIS There is no hypermetabolic activity within the liver, adrenal glands, spleen or pancreas. Specifically, there is no abnormal metabolic activity within the tiny left adrenal nodule. There are no hypermetabolic lymph nodes in the abdomen or pelvis. CT images demonstrate mild aortoiliac atherosclerosis. SKELETON There is no hypermetabolic activity to suggest osseous metastatic disease. IMPRESSION: 1. 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 The left hilar mass demonstrates central extension in the AP window, abutting the left mainstem bronchus to the level of the carina. 2. Contralateral 12 mm hypermetabolic right upper lobe nodule is not morphologically typical for a metastasis and may reflect a synchronous primary cancer. Mildly hypermetabolic inferior right hilar node, nonspecific. 3. No evidence of metastatic disease within the abdomen or pelvis. 4. Indeterminate hypermetabolic activity at the base of  the epiglottis. This could reflect a small mucosal neoplasm or focal inflammation. Direct visualization recommended. Electronically Signed   By: Richardean Sale M.D.   On: 06/10/2017 16:27     I have independently reviewed the above radiologic studies.  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 On focused exam the left scalene node is not palpable. I reviewed the films with the patient and his family recommended that we proceed,  with bronchoscopy and ebus to obtain a tissue diagnosis.Patient history and xrays reviewed at [facility] yesterday and seen by oncology.  The goals risks and alternatives of the planned surgical procedure Procedure(s): VIDEO BRONCHOSCOPY WITH ENDOBRONCHIAL ULTRASOUND (N/A) LUNG BIOPSY (N/A)  have been discussed with the patient in detail. The risks of the procedure including death, infection, stroke, myocardial infarction, bleeding, blood transfusion have all been discussed specifically.  I have quoted [name] a 1% of perioperative mortality and a complication rate as high as 10  %. The patient's questions have been answered.DARCY CORDNER is willing  to proceed with the planned procedure.      Grace Isaac MD      [address] [place] [address] Office [phone]   Beeper [phone]  06/12/2017 6:52 AM

## 2017-06-12 NOTE — Anesthesia Postprocedure Evaluation (Signed)
Anesthesia Post Note  Patient: Trevor Zavala  Procedure(s) Performed: Procedure(s) (LRB): VIDEO BRONCHOSCOPY WITH ENDOBRONCHIAL ULTRASOUND (N/A) LEFT LUNG BIOPSY (Bilateral)     Patient location during evaluation: PACU Anesthesia Type: General Level of consciousness: awake and alert Pain management: pain level controlled Vital Signs Assessment: post-procedure vital signs reviewed and stable Respiratory status: spontaneous breathing, nonlabored ventilation, respiratory function stable and patient connected to nasal cannula oxygen Cardiovascular status: blood pressure returned to baseline and stable Postop Assessment: no signs of nausea or vomiting Anesthetic complications: no    Last Vitals:  Vitals:   06/12/17 0930 06/12/17 0941  BP: (!) 142/81 111/74  Pulse: 88 82  Resp: 18 18  Temp:  36.7 C    Last Pain:  Vitals:   06/12/17 0945  TempSrc:   PainSc: 0-No pain                 Tiajuana Amass

## 2017-06-12 NOTE — Discharge Instructions (Signed)
Flexible Bronchoscopy, Care After °This sheet gives you information about how to care for yourself after your procedure. Your health care provider may also give you more specific instructions. If you have problems or questions, contact your health care provider. °What can I expect after the procedure? °After the procedure, it is common to have the following symptoms for 24-48 hours: °· A cough that is worse than it was before the procedure. °· A low-grade fever. °· A sore throat or hoarse voice. °· Small streaks of blood in the mucus from your lungs (sputum), if tissue samples were removed (biopsy). ° °Follow these instructions at home: °Eating and drinking °· Do not eat or drink anything (including water) for 2 hours after your procedure, or until your numbing medicine (local anesthetic) has worn off. Having a numb throat increases your risk of burning yourself or choking. °· After your numbness is gone and your cough and gag reflexes have returned, you may start eating only soft foods and slowly drinking liquids. °· The day after the procedure, return to your normal diet. °Driving °· Do not drive for 24 hours if you were given a medicine to help you relax (sedative). °· Do not drive or use heavy machinery while taking prescription pain medicine. °General instructions °· Take over-the-counter and prescription medicines only as told by your health care provider. °· Return to your normal activities as told by your health care provider. Ask your health care provider what activities are safe for you. °· Do not use any products that contain nicotine or tobacco, such as cigarettes and e-cigarettes. If you need help quitting, ask your health care provider. °· Keep all follow-up visits as told by your health care provider. This is important, especially if you had a biopsy taken. °Get help right away if: °· You have shortness of breath that gets worse. °· You become light-headed or feel like you might faint. °· You have  chest pain. °· You cough up more than a small amount of blood. °· The amount of blood you cough up increases. °Summary °· Common symptoms in the 24-48 hours following a flexible bronchoscopy include cough, low-grade fever, sore throat or hoarse voice, and blood-streaked mucus from the lungs (if you had a biopsy). °· Do not eat or drink anything (including water) for 2 hours after your procedure, or until your local anesthetic has worn off. You can return to your normal diet the day after the procedure. °· Get help right away if you develop worsening shortness of breath, have chest pain, become light-headed, or cough up more than a small amount of blood. °This information is not intended to replace advice given to you by your health care provider. Make sure you discuss any questions you have with your health care provider. °Document Released: 05/16/2005 Document Revised: 11/14/2016 Document Reviewed: 11/14/2016 °Elsevier Interactive Patient Education © 2017 [employer] ° °

## 2017-06-13 ENCOUNTER — Encounter (HOSPITAL_COMMUNITY): Payer: Self-pay | Admitting: Cardiothoracic Surgery

## 2017-06-15 ENCOUNTER — Encounter: Payer: Self-pay | Admitting: *Deleted

## 2017-06-15 ENCOUNTER — Telehealth: Payer: Self-pay | Admitting: *Deleted

## 2017-06-15 DIAGNOSIS — R918 Other nonspecific abnormal finding of lung field: Secondary | ICD-10-CM

## 2017-06-15 NOTE — Op Note (Deleted)
  The note originally documented on this encounter has been moved the the encounter in which it belongs.  

## 2017-06-15 NOTE — Telephone Encounter (Signed)
Oncology Nurse Navigator Documentation  Oncology Nurse Navigator Flowsheets 06/15/2017  Navigator Location [facility]  Navigator Encounter Type Telephone/I updated Dr. Julien Nordmann that pathology is completed.  He requested PDL 1 testing and a follow up with him on Friday.  I requested tissue to be sent for PDL 1 testing.  I called patient to update him on appt to see Dr. Julien Nordmann. I was unable to reach but left vm message to call me.   Telephone Outgoing Call  Confirmed Diagnosis Date 06/12/2017  [facility] Date 06/11/2017  Treatment Phase Pre-Tx/Tx Discussion  Barriers/Navigation Needs Coordination of Care  Interventions Coordination of Care  Coordination of Care Appts  Acuity Level 2  Acuity Level 2 Educational needs  Time Spent with Patient 30

## 2017-06-15 NOTE — Progress Notes (Signed)
Mr. Ponzo called me back and I updated him on appt for 06/19/17.  He verbalized understanding of appt time and place.

## 2017-06-15 NOTE — Op Note (Signed)
Trevor Zavala, Trevor Zavala                ACCOUNT NO.:  [id number]  MEDICAL RECORD NO.:  [id number]  LOCATION:                               FACILITY:  [facility]  PHYSICIAN:  Lanelle Bal, MD    DATE OF BIRTH:  [birth year]  DATE OF PROCEDURE:  06/12/2017 DATE OF DISCHARGE:                              OPERATIVE REPORT   PREOPERATIVE DIAGNOSIS:  Left hilar mass suspicious for carcinoma of the lung.  POSTOPERATIVE DIAGNOSIS:  Left hilar mass suspicious for carcinoma of the lung.  SURGICAL PROCEDURE:  Video bronchoscopy with endobronchial ultrasound and transbronchial biopsies.  SURGEON:  Lanelle Bal, MD.  BRIEF HISTORY:  The patient is a 51 year old male, who presents with abnormal chest x-ray and new onset of hoarseness.  CT scan and PET scan, MRI of the brain suggested advanced stage lung cancer with a left hilar mass and hilar adenopathy.  MRI showed no evidence of malignancy.  The MRI of the brain showed no evidence of metastatic spread to the brain. The patient did have some small bilateral pulmonary nodule, in addition a left scalene node biopsy was evident on PET scan; however, this was deep and not palpable.  We recommend to the patient proceeding with bronchoscopy with biopsy and EBUS with biopsy to obtain a tissue diagnosis.  Risks and options were discussed with the patient, who agreed and signed informed consent.  DESCRIPTION OF PROCEDURE:  The patient underwent general endotracheal anesthesia without incident.  An appropriate time-out was performed and we then proceeded with video bronchoscopy through the endotracheal tube. Photographs of this are in the patient's Epic chart.  The right tracheobronchial tree was examined first lesions. In the lower lobe bronchus there were small white plaques evident, bx of this la bled right lower lobe .   In the left mainstem bronchus distally, there was area of endobronchial lesion.  This was brushed and submitted as left mainstem  bronchus.  In addition, there was very friable, easily bled area in the left upper lobe.  Brushings of this were also obtained.  We then removed the videoscope and placed the EBUS scope and easily identified 4L lymph nodes that were enlarged. Multiple passes of 4L lymph node were done and submitted for both quick stain and permanent.  Quick stain suggested atypical cells.  Initial brushings of the left mainstem bronchus and left upper bronchus had malignant cells present.  After obtaining tissue from 4L region, we went back to the video scope and with the biopsy forceps, obtained biopsies in the left upper lobe region.  This tissue was friable and we applied dilute topical epinephrine to decrease bleeding with the tracheobronchial tree completely suctioned free of any blood and no active bleeding.  We then completed the procedure.  The patient was awakened and extubated in the operating room and transferred to the recovery room for further postoperative observation.  He tolerated the procedure without complication.     Lanelle Bal, MD   ______________________________ Lanelle Bal, MD                    EG/MEDQ  D:  06/15/2017  T:  06/15/2017  Job:  [id number]

## 2017-06-18 ENCOUNTER — Encounter: Payer: Self-pay | Admitting: *Deleted

## 2017-06-18 NOTE — Progress Notes (Signed)
Oncology Nurse Navigator Documentation  Oncology Nurse Navigator Flowsheets 06/18/2017  Navigator Location [facility]  Navigator Encounter Type Letter/Fax/Email/I received an email from patient's wife.  She had questions on next steps. I updated.   Treatment Phase Pre-Tx/Tx Discussion  Barriers/Navigation Needs Education  Education Other  Interventions Education  Acuity Level 1  Time Spent with Patient 15

## 2017-06-19 ENCOUNTER — Ambulatory Visit: Payer: Managed Care, Other (non HMO) | Admitting: Radiation Oncology

## 2017-06-19 ENCOUNTER — Ambulatory Visit (HOSPITAL_BASED_OUTPATIENT_CLINIC_OR_DEPARTMENT_OTHER): Payer: Managed Care, Other (non HMO) | Admitting: Internal Medicine

## 2017-06-19 ENCOUNTER — Encounter: Payer: Self-pay | Admitting: Internal Medicine

## 2017-06-19 ENCOUNTER — Other Ambulatory Visit (HOSPITAL_BASED_OUTPATIENT_CLINIC_OR_DEPARTMENT_OTHER): Payer: Managed Care, Other (non HMO)

## 2017-06-19 ENCOUNTER — Encounter: Payer: Self-pay | Admitting: Radiation Oncology

## 2017-06-19 ENCOUNTER — Telehealth: Payer: Self-pay | Admitting: Internal Medicine

## 2017-06-19 VITALS — BP 136/74 | HR 60 | Temp 98.8°F | Resp 18 | Ht 68.75 in | Wt 163.7 lb

## 2017-06-19 DIAGNOSIS — C3412 Malignant neoplasm of upper lobe, left bronchus or lung: Secondary | ICD-10-CM | POA: Insufficient documentation

## 2017-06-19 DIAGNOSIS — Z720 Tobacco use: Secondary | ICD-10-CM

## 2017-06-19 DIAGNOSIS — C3492 Malignant neoplasm of unspecified part of left bronchus or lung: Secondary | ICD-10-CM

## 2017-06-19 DIAGNOSIS — J449 Chronic obstructive pulmonary disease, unspecified: Secondary | ICD-10-CM

## 2017-06-19 DIAGNOSIS — Z7189 Other specified counseling: Secondary | ICD-10-CM | POA: Insufficient documentation

## 2017-06-19 DIAGNOSIS — R918 Other nonspecific abnormal finding of lung field: Secondary | ICD-10-CM | POA: Diagnosis not present

## 2017-06-19 DIAGNOSIS — I10 Essential (primary) hypertension: Secondary | ICD-10-CM

## 2017-06-19 DIAGNOSIS — Z5111 Encounter for antineoplastic chemotherapy: Secondary | ICD-10-CM

## 2017-06-19 DIAGNOSIS — R042 Hemoptysis: Secondary | ICD-10-CM | POA: Diagnosis not present

## 2017-06-19 HISTORY — DX: Malignant neoplasm of unspecified part of left bronchus or lung: C34.92

## 2017-06-19 LAB — CBC WITH DIFFERENTIAL/PLATELET
BASO%: 0.6 % (ref 0.0–2.0)
Basophils Absolute: 0.1 10*3/uL (ref 0.0–0.1)
EOS ABS: 0.1 10*3/uL (ref 0.0–0.5)
EOS%: 1.2 % (ref 0.0–7.0)
HEMATOCRIT: 43.7 % (ref 38.4–49.9)
HGB: 14.8 g/dL (ref 13.0–17.1)
LYMPH#: 1.5 10*3/uL (ref 0.9–3.3)
LYMPH%: 15.7 % (ref 14.0–49.0)
MCH: 33 pg (ref 27.2–33.4)
MCHC: 33.8 g/dL (ref 32.0–36.0)
MCV: 97.7 fL (ref 79.3–98.0)
MONO#: 0.8 10*3/uL (ref 0.1–0.9)
MONO%: 8.6 % (ref 0.0–14.0)
NEUT%: 73.9 % (ref 39.0–75.0)
NEUTROS ABS: 7 10*3/uL — AB (ref 1.5–6.5)
PLATELETS: 281 10*3/uL (ref 140–400)
RBC: 4.48 10*6/uL (ref 4.20–5.82)
RDW: 12.7 % (ref 11.0–14.6)
WBC: 9.4 10*3/uL (ref 4.0–10.3)

## 2017-06-19 LAB — COMPREHENSIVE METABOLIC PANEL
ALK PHOS: 86 U/L (ref 40–150)
ALT: 18 U/L (ref 0–55)
ANION GAP: 6 meq/L (ref 3–11)
AST: 12 U/L (ref 5–34)
Albumin: 3.1 g/dL — ABNORMAL LOW (ref 3.5–5.0)
BILIRUBIN TOTAL: 0.34 mg/dL (ref 0.20–1.20)
BUN: 10 mg/dL (ref 7.0–26.0)
CALCIUM: 10.1 mg/dL (ref 8.4–10.4)
CO2: 30 meq/L — AB (ref 22–29)
Chloride: 106 mEq/L (ref 98–109)
Creatinine: 0.8 mg/dL (ref 0.7–1.3)
Glucose: 118 mg/dl (ref 70–140)
Potassium: 5.1 mEq/L (ref 3.5–5.1)
Sodium: 142 mEq/L (ref 136–145)
TOTAL PROTEIN: 6.2 g/dL — AB (ref 6.4–8.3)

## 2017-06-19 MED ORDER — PROCHLORPERAZINE MALEATE 10 MG PO TABS: 10.0000 mg | ORAL_TABLET | Freq: Four times a day (QID) | ORAL | 0 refills | Status: DC | PRN

## 2017-06-19 NOTE — Telephone Encounter (Signed)
Scheduled appt per 8/10 los - Gave patient AVS and calender per los. - Per Radiation scheduler - melanie - appts scheduled for 8/13 - patient awre.

## 2017-06-19 NOTE — Progress Notes (Signed)
START ON PATHWAY REGIMEN - Non-Small Cell Lung     Administer weekly:     Paclitaxel      Carboplatin   **Always confirm dose/schedule in your pharmacy ordering system**    Patient Characteristics: Stage III - Unresectable, PS = 0, 1 AJCC T Category: T3 Current Disease Status: No Distant Mets or Local Recurrence AJCC N Category: N3 AJCC M Category: M0 AJCC 8 Stage Grouping: IIIC Performance Status: PS = 0, 1 Intent of Therapy: Curative Intent, Not Discussed with Patient

## 2017-06-19 NOTE — Progress Notes (Signed)
[facility] [phone]   [phone]  OFFICE PROGRESS NOTE  [name], [name], MD [place] [address]  DIAGNOSIS: Stage IIB/IV (T3, N3, M1a) non-small cell lung cancer, squamous cell carcinoma presented with large left hilar mass in addition to mediastinal and left supraclavicular lymphadenopathy as well as contralateral right upper lobe nodule diagnosed in August 2018.  PRIOR THERAPY: None  CURRENT THERAPY: Concurrent chemoradiation with weekly carboplatin for AUC of 2 and paclitaxel 45 MG/M2. First dose 06/29/2017.  INTERVAL HISTORY: Trevor Zavala 51 y.o. male returns to the clinic today for follow-up visit accompanied by his wife. The patient is feeling fine today except for the persistent cough and shortness of breath with exertion. He also continues to have hoarseness of his voice. He underwent several studies recently including PET scan as well as MRI of the brain. His MRI of the brain showed no evidence of metastatic disease to the brain. The patient also underwent bronchoscopy and endobronchial ultrasound and biopsies under the care of Dr. Servando Snare on 06/12/2017 and the final pathology was consistent with squamous cell carcinoma. PDL 1 expression is still pending. The patient denied having any weight loss or night sweats. He has no nausea, vomiting, diarrhea or constipation. He denied having any fever or chills. He is here today for evaluation and discussion of his treatment options.  MEDICAL HISTORY: Past Medical History:  Diagnosis Date  . Anxiety   . COPD (chronic obstructive pulmonary disease) ([place])   . GERD (gastroesophageal reflux disease)   . Headache   . Hypertension   . Pneumonia   . Situational depression     ALLERGIES:  has No Known Allergies.  MEDICATIONS:  Current Outpatient Prescriptions  Medication Sig Dispense Refill  . acyclovir (ZOVIRAX) 400 MG tablet TAKE 1 TABLET (400 MG TOTAL) BY MOUTH 3  (THREE) TIMES DAILY. 21 tablet 3  . aspirin (ASPIRIN CHILDRENS) 81 MG chewable tablet Chew 1 tablet (81 mg total) by mouth daily. 100 tablet 11  . BREO ELLIPTA 100-25 MCG/INH AEPB INHALE 1 PUFF INTO THE LUNGS DAILY 60 each 11  . Cholecalciferol (VITAMIN D PO) Take 1 tablet by mouth daily.    . clonazePAM (KLONOPIN) 0.5 MG tablet Take 1 tablet (0.5 mg total) by mouth 2 (two) times daily as needed for anxiety. (Patient taking differently: Take 0.25 mg by mouth daily. ) 180 tablet 0  . esomeprazole (NEXIUM) 40 MG capsule TAKE 1 CAPSULE BY MOUTH DAILY WITH BREAKFAST 90 capsule 2  . loratadine (CLARITIN) 10 MG tablet Take 10 mg by mouth daily as needed for allergies.    . naproxen sodium (ANAPROX) 220 MG tablet Take 220 mg by mouth 2 (two) times daily with a meal.    . nicotine (NICODERM CQ) 21 mg/24hr patch Place 1 patch (21 mg total) onto the skin daily. 28 patch 0  . valsartan (DIOVAN) 160 MG tablet Take 1 tablet (160 mg total) by mouth daily. 30 tablet 11   No current facility-administered medications for this visit.     SURGICAL HISTORY:  Past Surgical History:  Procedure Laterality Date  . COLONOSCOPY    . EAR CYST EXCISION N/A 11/17/2013   Procedure: SEBACEOUS CYST CHEST;  Surgeon: Joyice Faster. Cornett, MD;  Location: [facility];  Service: General;  Laterality: N/A;  . KNEE ARTHROSCOPY     LEFT  . LIPOMA EXCISION N/A 11/17/2013   Procedure: EXCISION LIPOMA FOREHEAD;  Surgeon: Joyice Faster.  Cornett, MD;  Location: [facility];  Service: General;  Laterality: N/A;  . LUNG BIOPSY Bilateral 06/12/2017   Procedure: LEFT LUNG BIOPSY;  Surgeon: Grace Isaac, MD;  Location: [facility];  Service: Thoracic;  Laterality: Bilateral;  . VIDEO BRONCHOSCOPY WITH ENDOBRONCHIAL ULTRASOUND N/A 06/12/2017   Procedure: VIDEO BRONCHOSCOPY WITH ENDOBRONCHIAL ULTRASOUND;  Surgeon: Grace Isaac, MD;  Location: [facility];  Service: Thoracic;  Laterality: N/A;    REVIEW OF SYSTEMS:   Constitutional: positive for fatigue Eyes: negative Ears, nose, mouth, throat, and face: negative Respiratory: positive for cough, dyspnea on exertion and wheezing Cardiovascular: negative Gastrointestinal: negative Genitourinary:negative Integument/breast: negative Hematologic/lymphatic: negative Musculoskeletal:negative Neurological: negative Behavioral/Psych: negative Endocrine: negative Allergic/Immunologic: negative   PHYSICAL EXAMINATION: General appearance: alert, cooperative, fatigued and no distress Head: Normocephalic, without obvious abnormality, atraumatic Neck: no adenopathy, no JVD, supple, symmetrical, trachea midline and thyroid not enlarged, symmetric, no tenderness/mass/nodules Lymph nodes: Cervical, supraclavicular, and axillary nodes normal. Resp: wheezes bilaterally Back: symmetric, no curvature. ROM normal. No CVA tenderness. Cardio: regular rate and rhythm, S1, S2 normal, no murmur, click, rub or gallop GI: soft, non-tender; bowel sounds normal; no masses,  no organomegaly Extremities: extremities normal, atraumatic, no cyanosis or edema Neurologic: Alert and oriented X 3, normal strength and tone. Normal symmetric reflexes. Normal coordination and gait  ECOG PERFORMANCE STATUS: 1 - Symptomatic but completely ambulatory  Blood pressure 136/74, pulse 60, temperature 98.8 F (37.1 C), temperature source Oral, resp. rate 18, height 5' 8.75" (1.746 m), weight 163 lb 11.2 oz (74.3 kg), SpO2 99 %.  LABORATORY DATA: Lab Results  Component Value Date   WBC 9.4 06/19/2017   HGB 14.8 06/19/2017   HCT 43.7 06/19/2017   MCV 97.7 06/19/2017   PLT 281 06/19/2017      Chemistry      Component Value Date/Time   NA 140 06/11/2017 1502   K 4.7 06/11/2017 1502   CL 102 06/06/2017 1045   CO2 32 (H) 06/11/2017 1502   BUN 6.8 (L) 06/11/2017 1502   CREATININE 0.8 06/11/2017 1502      Component Value Date/Time   CALCIUM 9.7 06/11/2017 1502   ALKPHOS 79 06/11/2017  1502   AST 16 06/11/2017 1502   ALT 16 06/11/2017 1502   BILITOT <0.22 06/11/2017 1502       RADIOGRAPHIC STUDIES: Dg Chest 2 View  Result Date: 06/12/2017 CLINICAL DATA:  Preoperative evaluation for lung biopsy. EXAM: CHEST  2 VIEW COMPARISON:  Prior PET-CT from 06/10/2017. FINDINGS: Transverse heart size within normal limits. Asymmetric fullness within the left hilum, compatible with known left hilar mass. Mediastinal silhouette otherwise normal. Lungs normally inflated. Probable postobstructive atelectasis within the lingula, similar to previous. No focal infiltrates. No pulmonary edema or pleural effusion. No pneumothorax. Known right upper lobe nodule not well visualized. No acute osseous abnormality. IMPRESSION: 1. Asymmetric fullness at the left hilum, consistent with known left hilar mass. Associated postobstructive atelectasis within the lingula. 2. No other active cardiopulmonary disease. Electronically Signed   By: Jeannine Boga M.D.   On: 06/12/2017 06:58   Dg Chest 2 View  Result Date: 06/06/2017 CLINICAL DATA:  Shortness of breath, cough, left hilar mass EXAM: CHEST  2 VIEW COMPARISON:  05/27/2017, 06/05/2017 FINDINGS: Mild hyperinflation compatible with background emphysema. Left hilar prominence correlates with the CT from yesterday compatible with the ill-defined infiltrative left hilar/ mediastinal mass. Streaky lingula bandlike opacity, compatible with atelectasis. Right lung remains clear. No significant interval change. No new focal acute  airspace process, significant lung collapse, edema, effusion or pneumothorax. Trachea is midline. Monitor leads overlie the chest. No acute osseous finding. No soft tissue abnormality appreciated IMPRESSION: Mild hilar prominence correlates with the left hilar/ mediastinal infiltrative mass by CT from yesterday. Minor lingula partial collapse/ atelectasis Mild hyperinflation No significant interval change or acute process by plain  radiography Electronically Signed   By: Jerilynn Mages.  Shick M.D.   On: 06/06/2017 10:57   Dg Chest 2 View  Result Date: 05/27/2017 CLINICAL DATA:  Cough and congestion for 1 week EXAM: CHEST  2 VIEW COMPARISON:  11/23/2014 FINDINGS: Cardiac shadow is stable. Lungs are hyperinflated bilaterally. No focal infiltrate or sizable effusion is seen. Persistent lingular scarring is noted. No bony abnormality is seen. IMPRESSION: Mild COPD without acute abnormality. Some lingular scarring remains. Electronically Signed   By: Inez Catalina M.D.   On: 05/27/2017 16:56   Ct Chest W Contrast  Result Date: 06/05/2017 CLINICAL DATA:  Hemoptysis 2 weeks ago. Weight loss. Smoker with history of COPD. EXAM: CT CHEST WITH CONTRAST TECHNIQUE: Multidetector CT imaging of the chest was performed during intravenous contrast administration. CONTRAST:  60mL ISOVUE-300 IOPAMIDOL (ISOVUE-300) INJECTION 61% COMPARISON:  Chest radiographs 05/27/2017. Report only from chest CT 04/26/2002 FINDINGS: Cardiovascular: Mild atherosclerosis of the aorta, great vessels and coronary arteries. No evidence of acute pulmonary embolism. There is left pulmonary arterial encasement by tumor, further described below. The heart size is normal. There is no pericardial effusion. Mediastinum/Nodes: There is an irregular left hilar mass, encasing the pulmonary vessels and left upper lobe bronchus. This mass measures up to 4.9 x 4.1 cm transverse on image 76. There is contiguous left hilar and AP window lymphadenopathy. An AP window node measures up to 2.5 x 3.5 cm on image 64. There is an enlarged left supraclavicular node, measuring 15 mm on coronal image 60. There also prominent right hilar (9 mm, image 76) and subcarinal (9 mm, image 78) nodes. The thyroid gland, trachea and esophagus demonstrate no significant findings. Lungs/Pleura: Trace dependent pleural fluid versus thickening on the left. Mild centrilobular emphysema. As above, there is a large left hilar mass  causing narrowing of the left upper lobe bronchus. There is central airway thickening and patchy pneumonitis throughout the left upper lobe, but no lobar collapse. There is a 7 mm left upper lobe pulmonary nodule on image 31. There is another smaller left upper lobe nodule on image 35. No dominant peripheral left lung mass identified. There is an irregular somewhat spiculated right upper lobe nodule measuring 10 x 7 mm on image 49. Upper abdomen: Small nonspecific nodule involving the lateral limb of the left adrenal gland, measuring 13 x 11 mm on image 166. The right adrenal gland and visualized liver appear unremarkable. Musculoskeletal/Chest wall: There is no chest wall mass or suspicious osseous finding. IMPRESSION: 1. Left hilar mass highly worrisome for bronchogenic carcinoma. There is narrowing of the left upper lobe bronchus and pulmonary arteries with postobstructive left upper lobe pneumonitis. 2. Enlarged mediastinal, hilar and left supraclavicular nodes worrisome for metastatic disease. 3. No dominant peripheral left lung mass identified. There are small left upper lobe nodules and a more worrisome right upper lobe nodule. The latter could reflect a metastasis or synchronous primary. 4. Indeterminate small left adrenal nodule. 5. Tissue sampling recommended. PET-CT may be helpful for further evaluation. 6. These results will be called to the ordering clinician or representative by the Radiologist Assistant, and communication documented in the PACS or zVision Dashboard. Electronically  Signed   By: Richardean Sale M.D.   On: 06/05/2017 14:19   Ct Angio Chest Pe W And/or Wo Contrast  Result Date: 06/06/2017 CLINICAL DATA:  Shortness of breath, cough EXAM: CT ANGIOGRAPHY CHEST WITH CONTRAST TECHNIQUE: Multidetector CT imaging of the chest was performed using the standard protocol during bolus administration of intravenous contrast. Multiplanar CT image reconstructions and MIPs were obtained to evaluate  the vascular anatomy. CONTRAST:  80 cc Isovue 370 IV COMPARISON:  Chest x-ray earlier today.  Chest CT 06/05/2017 FINDINGS: Cardiovascular: No filling defects in the pulmonary artery to suggest pulmonary emboli. Atherosclerotic calcifications scattered in the aorta. No evidence of aneurysm. Left pulmonary arterial encasement by tumor as described below. Mediastinum/Nodes: Again noted is the large left hilar mass encasing the pulmonary vessels and left upper lobe bronchus, unchanged since study 2 days ago. AP window adenopathy is stable. No right hilar or axillary adenopathy. Left supraclavicular adenopathy again noted, unchanged. Lungs/Pleura: Airway thickening bilaterally, likely bronchitis. No effusions. Lingular atelectasis. Patchy pneumonitis in the left upper lobe, stable. Previously seen 7 mm nodule in the left upper lobe is not as well visualized but likely present on image 27. Right upper lobe nodule anteriorly on image 47 measures 10 mm, stable. No effusions. Upper Abdomen: Imaging into the upper abdomen shows no acute findings. Musculoskeletal: Chest wall soft tissues are unremarkable. No acute bony abnormality. Review of the MIP images confirms the above findings. IMPRESSION: No evidence of pulmonary embolus. Stable encasement of the left pulmonary vessels and upper lobe bronchus by a irregular left hilar mass. Associated AP window/mediastinal adenopathy and left supraclavicular adenopathy. Findings are stable since study 2 days ago. Findings highly suspicious for bronchogenic carcinoma. Stable bilateral pulmonary nodules. Stable left upper lobe pneumonitis and diffuse peribronchial thickening. Electronically Signed   By: Rolm Baptise M.D.   On: 06/06/2017 13:49   Mr Jeri Cos PP Contrast  Result Date: 06/11/2017 CLINICAL DATA:  51 y/o M; possible lung cancer, biopsy to confirm 06/12/2017, evaluate for intracranial metastasis. EXAM: MRI HEAD WITHOUT AND WITH CONTRAST TECHNIQUE: Multiplanar, multiecho  pulse sequences of the brain and surrounding structures were obtained without and with intravenous contrast. CONTRAST:  71mL MULTIHANCE GADOBENATE DIMEGLUMINE 529 MG/ML IV SOLN COMPARISON:  None. FINDINGS: Brain: No acute infarction, hemorrhage, hydrocephalus, extra-axial collection or mass lesion. Few scattered nonspecific punctate foci of T2 FLAIR hyperintense signal abnormality in subcortical white matter is consistent with mild chronic microvascular ischemic changes. Mild brain parenchymal volume loss. No abnormal enhancement of the brain. Vascular: Normal flow voids. Skull and upper cervical spine: Normal marrow signal. No abnormal enhancement. Sinuses/Orbits: Mild paranasal sinus mucosal thickening and small left maxillary sinus mucous retention cyst. No abnormal signal of mastoid air cells. Normal orbits. Other: None. IMPRESSION: 1. No intracranial metastatic disease identified. 2. Mild chronic microvascular ischemic changes and mild parenchymal volume loss of the brain. Electronically Signed   By: Kristine Garbe M.D.   On: 06/11/2017 22:10   Nm Pet Image Initial (pi) Skull Base To Thigh  Result Date: 06/10/2017 CLINICAL DATA:  Initial treatment strategy for left upper lobe mass on CT, presumed bronchogenic carcinoma. EXAM: NUCLEAR MEDICINE PET SKULL BASE TO THIGH TECHNIQUE: 12.51 mCi F-18 FDG was injected intravenously. Full-ring PET imaging was performed from the skull base to thigh after the radiotracer. CT data was obtained and used for attenuation correction and anatomic localization. FASTING BLOOD GLUCOSE:  Value: 84 Mg/dl COMPARISON:  Chest CT 06/05/2017 and 06/06/2017. FINDINGS: NECK Hypermetabolic left supraclavicular node  measures 16 mm on image 74 and has an SUV max of 13.4. No other hypermetabolic cervical lymph nodes. There is midline hypermetabolic activity involving the base of the epiglottis. This has an SUV max of 5.6. No focal lesion is apparent in this area on the CT images.No  other lesions of the pharyngeal mucosal space are seen. CHEST The large left hilar mass is markedly hypermetabolic. This has an SUV max of 22.2 and measures approximately 3.8 x 6.2 cm on image 107. This encases the left upper lobe bronchus and abuts the left mainstem bronchus to the level of the carina. No endobronchial component identified. In addition to the left supraclavicular node, there are small hypermetabolic left paratracheal (SUV max 5.7) and subcarinal (SUV max 3.4) lymph nodes. There is a small hypermetabolic inferior right hilar node (SUV max 2.6). There are no peripheral hypermetabolic nodules within the left lung. There is stable mild pneumonitis within the left upper lobe. The small spiculated right upper lobe nodule is hypermetabolic. This nodule measures 12 x 8 mm on image 97 and has an SUV max of 3.5. ABDOMEN/PELVIS There is no hypermetabolic activity within the liver, adrenal glands, spleen or pancreas. Specifically, there is no abnormal metabolic activity within the tiny left adrenal nodule. There are no hypermetabolic lymph nodes in the abdomen or pelvis. CT images demonstrate mild aortoiliac atherosclerosis. SKELETON There is no hypermetabolic activity to suggest osseous metastatic disease. IMPRESSION: 1. 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 The left hilar mass demonstrates central extension in the AP window, abutting the left mainstem bronchus to the level of the carina. 2. Contralateral 12 mm hypermetabolic right upper lobe nodule is not morphologically typical for a metastasis and may reflect a synchronous primary cancer. Mildly hypermetabolic inferior right hilar node, nonspecific. 3. No evidence of metastatic disease within the abdomen or pelvis. 4. Indeterminate hypermetabolic activity at the base of the epiglottis. This could reflect a small  mucosal neoplasm or focal inflammation. Direct visualization recommended. Electronically Signed   By: Richardean Sale M.D.   On: 06/10/2017 16:27    ASSESSMENT AND PLAN: This is a very pleasant 51 years old white male with recently diagnosed stage IIIB/IV non-small cell lung cancer, adenocarcinoma presented with large left hilar mass in addition to mediastinal and left supraclavicular lymphadenopathy as well as suspicious right upper lobe pulmonary nodule diagnosed in August 2018. I had a lengthy discussion with the patient and his wife about his treatment options and goals of care. The patient was given the option of palliative care versus proceeding with palliative systemic treatment for his condition. He could benefit from aggressive treatment with a course of concurrent chemoradiation to the left hilar mass, mediastinal and left supraclavicular lymphadenopathy as a stage IIIB disease in addition to stereotactic radiotherapy to the contralateral pulmonary nodule. The patient could be also treated as a stage IV disease with systemic chemotherapy or immunotherapy have PDL 1 expression is over 50%. He is interested in the course of concurrent chemoradiation. I recommended for the patient a course of concurrent chemoradiation with weekly carboplatin for AUC of 2 and paclitaxel 45 MG/M2. I discussed with the patient adverse effect of the chemotherapy including but not limited to alopecia, myelosuppression, nausea and vomiting, peripheral neuropathy, liver or renal dysfunction. I will arrange for the patient to have a chemotherapy education class before starting the first dose of his chemotherapy. I will refer  the patient to radiation oncology for evaluation and discussion of the radiotherapy option. He is expected to start the first dose of concurrent chemoradiation on 06/30/2017. I will call his pharmacy with prescription for Compazine 10 mg by mouth every 6 hours as needed for nausea. For smoke  cessation, I strongly encouraged the patient to quit smoking and he received his smoking cessation material and the education from the thoracic navigator. I will see the patient back for follow-up visit in 2 weeks for evaluation and management of any adverse effect of his treatment. The patient was advised to call immediately if he has any concerning symptoms in the interval. The patient voices understanding of current disease status and treatment options and is in agreement with the current care plan. All questions were answered. The patient knows to call the clinic with any problems, questions or concerns. We can certainly see the patient much sooner if necessary. Disclaimer: This note was dictated with voice recognition software. Similar sounding words can inadvertently be transcribed and may not be corrected upon review.

## 2017-06-19 NOTE — Patient Instructions (Signed)
Steps to Quit Smoking Smoking tobacco can be bad for your health. It can also affect almost every organ in your body. Smoking puts you and people around you at risk for many serious long-lasting (chronic) diseases. Quitting smoking is hard, but it is one of the best things that you can do for your health. It is never too late to quit. What are the benefits of quitting smoking? When you quit smoking, you lower your risk for getting serious diseases and conditions. They can include:  Lung cancer or lung disease.  Heart disease.  Stroke.  Heart attack.  Not being able to have children (infertility).  Weak bones (osteoporosis) and broken bones (fractures).  If you have coughing, wheezing, and shortness of breath, those symptoms may get better when you quit. You may also get sick less often. If you are pregnant, quitting smoking can help to lower your chances of having a baby of low birth weight. What can I do to help me quit smoking? Talk with your doctor about what can help you quit smoking. Some things you can do (strategies) include:  Quitting smoking totally, instead of slowly cutting back how much you smoke over a period of time.  Going to in-person counseling. You are more likely to quit if you go to many counseling sessions.  Using resources and support systems, such as: ? Online chats with a counselor. ? Phone quitlines. ? Printed self-help materials. ? Support groups or group counseling. ? Text messaging programs. ? Mobile phone apps or applications.  Taking medicines. Some of these medicines may have nicotine in them. If you are pregnant or breastfeeding, do not take any medicines to quit smoking unless your doctor says it is okay. Talk with your doctor about counseling or other things that can help you.  Talk with your doctor about using more than one strategy at the same time, such as taking medicines while you are also going to in-person counseling. This can help make  quitting easier. What things can I do to make it easier to quit? Quitting smoking might feel very hard at first, but there is a lot that you can do to make it easier. Take these steps:  Talk to your family and friends. Ask them to support and encourage you.  Call phone quitlines, reach out to support groups, or work with a counselor.  Ask people who smoke to not smoke around you.  Avoid places that make you want (trigger) to smoke, such as: ? Bars. ? Parties. ? Smoke-break areas at work.  Spend time with people who do not smoke.  Lower the stress in your life. Stress can make you want to smoke. Try these things to help your stress: ? Getting regular exercise. ? Deep-breathing exercises. ? Yoga. ? Meditating. ? Doing a body scan. To do this, close your eyes, focus on one area of your body at a time from head to toe, and notice which parts of your body are tense. Try to relax the muscles in those areas.  Download or buy apps on your mobile phone or tablet that can help you stick to your quit plan. There are many free apps, such as QuitGuide from the CDC (Centers for Disease Control and Prevention). You can find more support from smokefree.gov and other websites.  This information is not intended to replace advice given to you by your health care provider. Make sure you discuss any questions you have with your health care provider. Document Released: 08/23/2009 Document   Revised: 06/24/2016 Document Reviewed: 03/13/2015 Elsevier Interactive Patient Education  2018 [employer]vier Inc.  

## 2017-06-22 ENCOUNTER — Ambulatory Visit
Admission: RE | Admit: 2017-06-22 | Discharge: 2017-06-22 | Disposition: A | Discharge status: Home / Self Care | Payer: Managed Care, Other (non HMO) | Source: Ambulatory Visit | Attending: Radiation Oncology | Admitting: Radiation Oncology

## 2017-06-22 ENCOUNTER — Encounter: Payer: Self-pay | Admitting: Radiation Oncology

## 2017-06-22 ENCOUNTER — Telehealth: Payer: Self-pay | Admitting: Radiation Oncology

## 2017-06-22 VITALS — BP 136/82 | HR 80 | Temp 98.8°F | Resp 18 | Ht 70.0 in | Wt 166.8 lb

## 2017-06-22 DIAGNOSIS — Z8249 Family history of ischemic heart disease and other diseases of the circulatory system: Secondary | ICD-10-CM | POA: Diagnosis not present

## 2017-06-22 DIAGNOSIS — Z803 Family history of malignant neoplasm of breast: Secondary | ICD-10-CM | POA: Insufficient documentation

## 2017-06-22 DIAGNOSIS — Z510 Encounter for antineoplastic radiation therapy: Secondary | ICD-10-CM | POA: Diagnosis present

## 2017-06-22 DIAGNOSIS — Z808 Family history of malignant neoplasm of other organs or systems: Secondary | ICD-10-CM | POA: Diagnosis not present

## 2017-06-22 DIAGNOSIS — Z79899 Other long term (current) drug therapy: Secondary | ICD-10-CM | POA: Insufficient documentation

## 2017-06-22 DIAGNOSIS — I10 Essential (primary) hypertension: Secondary | ICD-10-CM | POA: Insufficient documentation

## 2017-06-22 DIAGNOSIS — K219 Gastro-esophageal reflux disease without esophagitis: Secondary | ICD-10-CM | POA: Diagnosis not present

## 2017-06-22 DIAGNOSIS — F4321 Adjustment disorder with depressed mood: Secondary | ICD-10-CM | POA: Diagnosis not present

## 2017-06-22 DIAGNOSIS — Z8371 Family history of colonic polyps: Secondary | ICD-10-CM | POA: Insufficient documentation

## 2017-06-22 DIAGNOSIS — C321 Malignant neoplasm of supraglottis: Secondary | ICD-10-CM | POA: Diagnosis not present

## 2017-06-22 DIAGNOSIS — C3411 Malignant neoplasm of upper lobe, right bronchus or lung: Secondary | ICD-10-CM

## 2017-06-22 DIAGNOSIS — Z8701 Personal history of pneumonia (recurrent): Secondary | ICD-10-CM | POA: Diagnosis not present

## 2017-06-22 DIAGNOSIS — Z7982 Long term (current) use of aspirin: Secondary | ICD-10-CM | POA: Diagnosis not present

## 2017-06-22 DIAGNOSIS — F17210 Nicotine dependence, cigarettes, uncomplicated: Secondary | ICD-10-CM | POA: Insufficient documentation

## 2017-06-22 DIAGNOSIS — C3412 Malignant neoplasm of upper lobe, left bronchus or lung: Secondary | ICD-10-CM | POA: Diagnosis not present

## 2017-06-22 DIAGNOSIS — J449 Chronic obstructive pulmonary disease, unspecified: Secondary | ICD-10-CM | POA: Diagnosis not present

## 2017-06-22 DIAGNOSIS — F419 Anxiety disorder, unspecified: Secondary | ICD-10-CM | POA: Diagnosis not present

## 2017-06-22 DIAGNOSIS — Z800 Family history of malignant neoplasm of digestive organs: Secondary | ICD-10-CM | POA: Diagnosis not present

## 2017-06-22 DIAGNOSIS — Z807 Family history of other malignant neoplasms of lymphoid, hematopoietic and related tissues: Secondary | ICD-10-CM | POA: Insufficient documentation

## 2017-06-22 DIAGNOSIS — C3492 Malignant neoplasm of unspecified part of left bronchus or lung: Secondary | ICD-10-CM

## 2017-06-22 NOTE — Telephone Encounter (Signed)
Patient had not shown for 1530 appointment. Finally reached wife. Wife explained they thought their appointment was at 4 pm.

## 2017-06-22 NOTE — Progress Notes (Signed)
See progress note under physician encounter. 

## 2017-06-22 NOTE — Progress Notes (Signed)
Radiation Oncology         [phone] ________________________________  Initial outpatient Consultation  Name: Trevor Zavala MRN: [id number]  Date: 06/22/2017  DOB: [birth year]  [id number], [name], MD  Curt Bears, MD   REFERRING PHYSICIAN: Curt Bears, MD  DIAGNOSIS: 51 yo man with stage IIB/IV (T3, N3, M1a) non-small celllung cancer, squamous cell carcinoma.  HISTORY OF PRESENT ILLNESS: Trevor Zavala is a 50 y.o. male seen at the request of Dr.[name] for a new diagnosis of lung cancer. He presented to his primary care physician with hemoptysis and unintentional weight loss, who ordered a CT scan of the chest , which was performed on 06/05/2017 and it showed left hilar mass highly worrisome for bronchogenic carcinoma. Patient was diagnosed with Stage IIB/IV (T3, N3, M1a) non-small celllung cancer, squamous cell carcinomapresented with large left hilar mass in addition to mediastinal and left supraclavicular lymphadenopathy as well as contralateral right upper lobe nodulediagnosed in August 2018. CT of the chest on 06/06/2017 revealed no evidence of pulmonary embolus. PET on 06/10/2017 revealed hypermetabolic large left hilar mass with hypermetabolic mediastinal and left supraclavicular lymph nodes, contralateral 12 mm hypermetabolic right upper lobe nodule is not morphologically typical for a metastasis and may reflect a synchronous primary cancer,and there is no evidence of metastatic disease within the abdomen or pelvis. On 06/11/2017, MRI of the brain showed no evidence of metastatic disease to the brain. Biopsy on 06/12/2017 under the care of Dr. Servando Snare revealed squamous cell carcinoma in the right lower lobe and the left upper lobe of the lung under. Under the care of Dr. Servando Snare patient had a bronchoscopy and endobronchial ultrasound. Patient will undergo chemoradiation with weekly carboplatin for AUC of 2 and paclitaxel 45 MG/M2 and the first dose is scheduled to  begin on 06/29/2017. Patient presents today with his wife.     PREVIOUS RADIATION THERAPY: No  PAST MEDICAL HISTORY:  Past Medical History:  Diagnosis Date  . Anxiety   . COPD (chronic obstructive pulmonary disease) ([place])   . GERD (gastroesophageal reflux disease)   . Headache   . Hypertension   . Pneumonia   . Situational depression   . Stage III squamous cell carcinoma of left lung ([place]) 06/19/2017      PAST SURGICAL HISTORY: Past Surgical History:  Procedure Laterality Date  . COLONOSCOPY    . EAR CYST EXCISION N/A 11/17/2013   Procedure: SEBACEOUS CYST CHEST;  Surgeon: Joyice Faster. Cornett, MD;  Location: [facility];  Service: General;  Laterality: N/A;  . KNEE ARTHROSCOPY     LEFT  . LIPOMA EXCISION N/A 11/17/2013   Procedure: EXCISION LIPOMA FOREHEAD;  Surgeon: Joyice Faster. Cornett, MD;  Location: [place];  Service: General;  Laterality: N/A;  . LUNG BIOPSY Bilateral 06/12/2017   Procedure: LEFT LUNG BIOPSY;  Surgeon: Grace Isaac, MD;  Location: [facility];  Service: Thoracic;  Laterality: Bilateral;  . VIDEO BRONCHOSCOPY WITH ENDOBRONCHIAL ULTRASOUND N/A 06/12/2017   Procedure: VIDEO BRONCHOSCOPY WITH ENDOBRONCHIAL ULTRASOUND;  Surgeon: Grace Isaac, MD;  Location: [place];  Service: Thoracic;  Laterality: N/A;    FAMILY HISTORY:  Family History  Problem Relation Age of Onset  . Breast cancer Mother   . Hypertension Mother   . Colon cancer Mother   . Skin cancer Father   . Hypertension Father   . Colon polyps Father   . Cancer Father 38       b cell lymphoma  . Colon  cancer Father     SOCIAL HISTORY:  Social History   Social History  . Marital status: Married    Spouse name: N/A  . Number of children: N/A  . Years of education: N/A   Occupational History  . Not on file.   Social History Main Topics  . Smoking status: Light Tobacco Smoker    Packs/day: 1.00    Years: 35.00    Last attempt to quit: 06/06/2017  . Smokeless  tobacco: Never Used  . Alcohol use 0.0 oz/week     Comment: SOCIAL  . Drug use: No  . Sexual activity: Yes   Other Topics Concern  . Not on file   Social History Narrative   Daily Caffeine Use:  3    ALLERGIES: Patient has no known allergies.  MEDICATIONS:  Current Outpatient Prescriptions  Medication Sig Dispense Refill  . acyclovir (ZOVIRAX) 400 MG tablet TAKE 1 TABLET (400 MG TOTAL) BY MOUTH 3 (THREE) TIMES DAILY. 21 tablet 3  . aspirin (ASPIRIN CHILDRENS) 81 MG chewable tablet Chew 1 tablet (81 mg total) by mouth daily. 100 tablet 11  . BREO ELLIPTA 100-25 MCG/INH AEPB INHALE 1 PUFF INTO THE LUNGS DAILY 60 each 11  . Cholecalciferol (VITAMIN D PO) Take 1 tablet by mouth daily.    Marland Kitchen esomeprazole (NEXIUM) 40 MG capsule TAKE 1 CAPSULE BY MOUTH DAILY WITH BREAKFAST 90 capsule 2  . loratadine (CLARITIN) 10 MG tablet Take 10 mg by mouth daily as needed for allergies.    . naproxen sodium (ANAPROX) 220 MG tablet Take 220 mg by mouth 2 (two) times daily with a meal.    . nicotine (NICODERM CQ) 21 mg/24hr patch Place 1 patch (21 mg total) onto the skin daily. 28 patch 0  . prochlorperazine (COMPAZINE) 10 MG tablet Take 1 tablet (10 mg total) by mouth every 6 (six) hours as needed for nausea or vomiting. 30 tablet 0  . valsartan (DIOVAN) 160 MG tablet Take 1 tablet (160 mg total) by mouth daily. 30 tablet 11  . clonazePAM (KLONOPIN) 0.5 MG tablet Take 1 tablet (0.5 mg total) by mouth 2 (two) times daily as needed for anxiety. (Patient not taking: Reported on 06/19/2017) 180 tablet 0   No current facility-administered medications for this encounter.     REVIEW OF SYSTEMS:  On review of systems, the patient reports that he is doing well overall. He denies fevers, chills, night sweats, and unintended weight changes. Patient complains of increasing shortness of breath as well as cough and left sided chest pain that radiated to his back since the middle of July 2018. He denies any bowel or  bladder disturbances, and denies abdominal pain, nausea or vomiting. He denies any new musculoskeletal or joint aches or pains. A complete review of systems is obtained and is otherwise negative.    PHYSICAL EXAM:  Wt Readings from Last 3 Encounters:  06/22/17 166 lb 12.8 oz (75.7 kg)  06/19/17 163 lb 11.2 oz (74.3 kg)  06/12/17 165 lb (74.8 kg)   Temp Readings from Last 3 Encounters:  06/22/17 98.8 F (37.1 C) (Oral)  06/19/17 98.8 F (37.1 C) (Oral)  06/12/17 98 F (36.7 C)   BP Readings from Last 3 Encounters:  06/22/17 136/82  06/19/17 136/74  06/12/17 111/74   Pulse Readings from Last 3 Encounters:  06/22/17 80  06/19/17 60  06/12/17 82   Pain Assessment Pain Score: 0-No pain/10  In general this is a well appearing Caucasian male in  no acute distress. He is alert and oriented x4 and appropriate throughout the examination. HEENT reveals that the patient is normocephalic, atraumatic. EOMs are intact. PERRLA. Skin is intact without any evidence of gross lesions. Cardiovascular exam reveals a regular rate and rhythm, no clicks rubs or murmurs are auscultated. Chest is clear to auscultation bilaterally. Lymphatic assessment is performed and does not reveal any adenopathy in the cervical, supraclavicular, axillary, or inguinal chains. Abdomen has active bowel sounds in all quadrants and is intact. The abdomen is soft, non tender, non distended. Lower extremities are negative for pretibial pitting edema, deep calf tenderness, cyanosis or clubbing.   KPS = 100  100 - Normal; no complaints; no evidence of disease. 90   - Able to carry on normal activity; minor signs or symptoms of disease. 80   - Normal activity with effort; some signs or symptoms of disease. 21   - Cares for self; unable to carry on normal activity or to do active work. 60   - Requires occasional assistance, but is able to care for most of his personal needs. 50   - Requires considerable assistance and frequent  medical care. 31   - Disabled; requires special care and assistance. 31   - Severely disabled; hospital admission is indicated although death not imminent. 40   - Very sick; hospital admission necessary; active supportive treatment necessary. 10   - Moribund; fatal processes progressing rapidly. 0     - Dead  Karnofsky DA, Abelmann [place], Craver LS and Burchenal JH [phone]) The use of the nitrogen mustards in the palliative treatment of carcinoma: with particular reference to bronchogenic carcinoma Cancer 1 [id number]  LABORATORY DATA:  Lab Results  Component Value Date   WBC 9.4 06/19/2017   HGB 14.8 06/19/2017   HCT 43.7 06/19/2017   MCV 97.7 06/19/2017   PLT 281 06/19/2017   Lab Results  Component Value Date   NA 142 06/19/2017   K 5.1 No visable hemolysis 06/19/2017   CL 102 06/06/2017   CO2 30 (H) 06/19/2017   Lab Results  Component Value Date   ALT 18 06/19/2017   AST 12 06/19/2017   ALKPHOS 86 06/19/2017   BILITOT 0.34 06/19/2017     RADIOGRAPHY: Dg Chest 2 View  Result Date: 06/12/2017 CLINICAL DATA:  Preoperative evaluation for lung biopsy. EXAM: CHEST  2 VIEW COMPARISON:  Prior PET-CT from 06/10/2017. FINDINGS: Transverse heart size within normal limits. Asymmetric fullness within the left hilum, compatible with known left hilar mass. Mediastinal silhouette otherwise normal. Lungs normally inflated. Probable postobstructive atelectasis within the lingula, similar to previous. No focal infiltrates. No pulmonary edema or pleural effusion. No pneumothorax. Known right upper lobe nodule not well visualized. No acute osseous abnormality. IMPRESSION: 1. Asymmetric fullness at the left hilum, consistent with known left hilar mass. Associated postobstructive atelectasis within the lingula. 2. No other active cardiopulmonary disease. Electronically Signed   By: Jeannine Boga M.D.   On: 06/12/2017 06:58   Dg Chest 2 View  Result Date: 06/06/2017 CLINICAL DATA:  Shortness of  breath, cough, left hilar mass EXAM: CHEST  2 VIEW COMPARISON:  05/27/2017, 06/05/2017 FINDINGS: Mild hyperinflation compatible with background emphysema. Left hilar prominence correlates with the CT from yesterday compatible with the ill-defined infiltrative left hilar/ mediastinal mass. Streaky lingula bandlike opacity, compatible with atelectasis. Right lung remains clear. No significant interval change. No new focal acute airspace process, significant lung collapse, edema, effusion or pneumothorax. Trachea is midline. Monitor leads overlie the  chest. No acute osseous finding. No soft tissue abnormality appreciated IMPRESSION: Mild hilar prominence correlates with the left hilar/ mediastinal infiltrative mass by CT from yesterday. Minor lingula partial collapse/ atelectasis Mild hyperinflation No significant interval change or acute process by plain radiography Electronically Signed   By: Jerilynn Mages.  Shick M.D.   On: 06/06/2017 10:57   Dg Chest 2 View  Result Date: 05/27/2017 CLINICAL DATA:  Cough and congestion for 1 week EXAM: CHEST  2 VIEW COMPARISON:  11/23/2014 FINDINGS: Cardiac shadow is stable. Lungs are hyperinflated bilaterally. No focal infiltrate or sizable effusion is seen. Persistent lingular scarring is noted. No bony abnormality is seen. IMPRESSION: Mild COPD without acute abnormality. Some lingular scarring remains. Electronically Signed   By: Inez Catalina M.D.   On: 05/27/2017 16:56   Ct Chest W Contrast  Result Date: 06/05/2017 CLINICAL DATA:  Hemoptysis 2 weeks ago. Weight loss. Smoker with history of COPD. EXAM: CT CHEST WITH CONTRAST TECHNIQUE: Multidetector CT imaging of the chest was performed during intravenous contrast administration. CONTRAST:  54mL ISOVUE-300 IOPAMIDOL (ISOVUE-300) INJECTION 61% COMPARISON:  Chest radiographs 05/27/2017. Report only from chest CT 04/26/2002 FINDINGS: Cardiovascular: Mild atherosclerosis of the aorta, great vessels and coronary arteries. No evidence of  acute pulmonary embolism. There is left pulmonary arterial encasement by tumor, further described below. The heart size is normal. There is no pericardial effusion. Mediastinum/Nodes: There is an irregular left hilar mass, encasing the pulmonary vessels and left upper lobe bronchus. This mass measures up to 4.9 x 4.1 cm transverse on image 76. There is contiguous left hilar and AP window lymphadenopathy. An AP window node measures up to 2.5 x 3.5 cm on image 64. There is an enlarged left supraclavicular node, measuring 15 mm on coronal image 60. There also prominent right hilar (9 mm, image 76) and subcarinal (9 mm, image 78) nodes. The thyroid gland, trachea and esophagus demonstrate no significant findings. Lungs/Pleura: Trace dependent pleural fluid versus thickening on the left. Mild centrilobular emphysema. As above, there is a large left hilar mass causing narrowing of the left upper lobe bronchus. There is central airway thickening and patchy pneumonitis throughout the left upper lobe, but no lobar collapse. There is a 7 mm left upper lobe pulmonary nodule on image 31. There is another smaller left upper lobe nodule on image 35. No dominant peripheral left lung mass identified. There is an irregular somewhat spiculated right upper lobe nodule measuring 10 x 7 mm on image 49. Upper abdomen: Small nonspecific nodule involving the lateral limb of the left adrenal gland, measuring 13 x 11 mm on image 166. The right adrenal gland and visualized liver appear unremarkable. Musculoskeletal/Chest wall: There is no chest wall mass or suspicious osseous finding. IMPRESSION: 1. Left hilar mass highly worrisome for bronchogenic carcinoma. There is narrowing of the left upper lobe bronchus and pulmonary arteries with postobstructive left upper lobe pneumonitis. 2. Enlarged mediastinal, hilar and left supraclavicular nodes worrisome for metastatic disease. 3. No dominant peripheral left lung mass identified. There are small  left upper lobe nodules and a more worrisome right upper lobe nodule. The latter could reflect a metastasis or synchronous primary. 4. Indeterminate small left adrenal nodule. 5. Tissue sampling recommended. PET-CT may be helpful for further evaluation. 6. These results will be called to the ordering clinician or representative by the Radiologist Assistant, and communication documented in the PACS or zVision Dashboard. Electronically Signed   By: Richardean Sale M.D.   On: 06/05/2017 14:19   Ct  Angio Chest Pe W And/or Wo Contrast  Result Date: 06/06/2017 CLINICAL DATA:  Shortness of breath, cough EXAM: CT ANGIOGRAPHY CHEST WITH CONTRAST TECHNIQUE: Multidetector CT imaging of the chest was performed using the standard protocol during bolus administration of intravenous contrast. Multiplanar CT image reconstructions and MIPs were obtained to evaluate the vascular anatomy. CONTRAST:  80 cc Isovue 370 IV COMPARISON:  Chest x-ray earlier today.  Chest CT 06/05/2017 FINDINGS: Cardiovascular: No filling defects in the pulmonary artery to suggest pulmonary emboli. Atherosclerotic calcifications scattered in the aorta. No evidence of aneurysm. Left pulmonary arterial encasement by tumor as described below. Mediastinum/Nodes: Again noted is the large left hilar mass encasing the pulmonary vessels and left upper lobe bronchus, unchanged since study 2 days ago. AP window adenopathy is stable. No right hilar or axillary adenopathy. Left supraclavicular adenopathy again noted, unchanged. Lungs/Pleura: Airway thickening bilaterally, likely bronchitis. No effusions. Lingular atelectasis. Patchy pneumonitis in the left upper lobe, stable. Previously seen 7 mm nodule in the left upper lobe is not as well visualized but likely present on image 27. Right upper lobe nodule anteriorly on image 47 measures 10 mm, stable. No effusions. Upper Abdomen: Imaging into the upper abdomen shows no acute findings. Musculoskeletal: Chest wall  soft tissues are unremarkable. No acute bony abnormality. Review of the MIP images confirms the above findings. IMPRESSION: No evidence of pulmonary embolus. Stable encasement of the left pulmonary vessels and upper lobe bronchus by a irregular left hilar mass. Associated AP window/mediastinal adenopathy and left supraclavicular adenopathy. Findings are stable since study 2 days ago. Findings highly suspicious for bronchogenic carcinoma. Stable bilateral pulmonary nodules. Stable left upper lobe pneumonitis and diffuse peribronchial thickening. Electronically Signed   By: Rolm Baptise M.D.   On: 06/06/2017 13:49   Mr Jeri Cos OZ Contrast  Result Date: 06/11/2017 CLINICAL DATA:  51 y/o M; possible lung cancer, biopsy to confirm 06/12/2017, evaluate for intracranial metastasis. EXAM: MRI HEAD WITHOUT AND WITH CONTRAST TECHNIQUE: Multiplanar, multiecho pulse sequences of the brain and surrounding structures were obtained without and with intravenous contrast. CONTRAST:  7mL MULTIHANCE GADOBENATE DIMEGLUMINE 529 MG/ML IV SOLN COMPARISON:  None. FINDINGS: Brain: No acute infarction, hemorrhage, hydrocephalus, extra-axial collection or mass lesion. Few scattered nonspecific punctate foci of T2 FLAIR hyperintense signal abnormality in subcortical white matter is consistent with mild chronic microvascular ischemic changes. Mild brain parenchymal volume loss. No abnormal enhancement of the brain. Vascular: Normal flow voids. Skull and upper cervical spine: Normal marrow signal. No abnormal enhancement. Sinuses/Orbits: Mild paranasal sinus mucosal thickening and small left maxillary sinus mucous retention cyst. No abnormal signal of mastoid air cells. Normal orbits. Other: None. IMPRESSION: 1. No intracranial metastatic disease identified. 2. Mild chronic microvascular ischemic changes and mild parenchymal volume loss of the brain. Electronically Signed   By: Kristine Garbe M.D.   On: 06/11/2017 22:10   Nm Pet  Image Initial (pi) Skull Base To Thigh  Result Date: 06/10/2017 CLINICAL DATA:  Initial treatment strategy for left upper lobe mass on CT, presumed bronchogenic carcinoma. EXAM: NUCLEAR MEDICINE PET SKULL BASE TO THIGH TECHNIQUE: 12.51 mCi F-18 FDG was injected intravenously. Full-ring PET imaging was performed from the skull base to thigh after the radiotracer. CT data was obtained and used for attenuation correction and anatomic localization. FASTING BLOOD GLUCOSE:  Value: 84 Mg/dl COMPARISON:  Chest CT 06/05/2017 and 06/06/2017. FINDINGS: NECK Hypermetabolic left supraclavicular node measures 16 mm on image 74 and has an SUV max of 13.4. No other hypermetabolic  cervical lymph nodes. There is midline hypermetabolic activity involving the base of the epiglottis. This has an SUV max of 5.6. No focal lesion is apparent in this area on the CT images.No other lesions of the pharyngeal mucosal space are seen. CHEST The large left hilar mass is markedly hypermetabolic. This has an SUV max of 22.2 and measures approximately 3.8 x 6.2 cm on image 107. This encases the left upper lobe bronchus and abuts the left mainstem bronchus to the level of the carina. No endobronchial component identified. In addition to the left supraclavicular node, there are small hypermetabolic left paratracheal (SUV max 5.7) and subcarinal (SUV max 3.4) lymph nodes. There is a small hypermetabolic inferior right hilar node (SUV max 2.6). There are no peripheral hypermetabolic nodules within the left lung. There is stable mild pneumonitis within the left upper lobe. The small spiculated right upper lobe nodule is hypermetabolic. This nodule measures 12 x 8 mm on image 97 and has an SUV max of 3.5. ABDOMEN/PELVIS There is no hypermetabolic activity within the liver, adrenal glands, spleen or pancreas. Specifically, there is no abnormal metabolic activity within the tiny left adrenal nodule. There are no hypermetabolic lymph nodes in the abdomen or  pelvis. CT images demonstrate mild aortoiliac atherosclerosis. SKELETON There is no hypermetabolic activity to suggest osseous metastatic disease. IMPRESSION: 1. 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 The left hilar mass demonstrates central extension in the AP window, abutting the left mainstem bronchus to the level of the carina. 2. Contralateral 12 mm hypermetabolic right upper lobe nodule is not morphologically typical for a metastasis and may reflect a synchronous primary cancer. Mildly hypermetabolic inferior right hilar node, nonspecific. 3. No evidence of metastatic disease within the abdomen or pelvis. 4. Indeterminate hypermetabolic activity at the base of the epiglottis. This could reflect a small mucosal neoplasm or focal inflammation. Direct visualization recommended. Electronically Signed   By: Richardean Sale M.D.   On: 06/10/2017 16:27      IMPRESSION/PLAN: 1. 51 y.o. gentleman with Stage IIB/IV (T3, N3, M1a) non-small celllung cancer, squamous cell carcinomapresented with large left hilar mass in addition to mediastinal and left supraclavicular lymphadenopathy as well as contralateral right upper lobe nodule.  Today, we talked to the patient and family about the findings and work-up thus far.  We discussed the natural history of locally advanced non-small cell lung cancer with oligometastatic disease and we discussed general treatment, highlighting the role of radiotherapy in the management.  We discussed the available radiation techniques, and focused on the details of logistics and delivery.  We reviewed the anticipated acute and late sequelae associated with radiation in this setting.  The patient was encouraged to ask questions that we answered to the best of our ability.   The patient would like to proceed with radiation and will be scheduled for CT  simulation.  2. Based on family history of cancer, we recommend patient undergoes genetic counseling.       Carola Rhine, PAC And   Tyler Pita, MD [facility] Director and Director of Stereotactic Radiosurgery Direct Dial: [phone]  Fax: [phone] [email or web address]  Skype  [email or web address]  This document serves as a record of services personally performed by Shona Simpson, PA-C and Tyler Pita, MD. It was created on their behalf by Valeta Harms, a trained medical scribe. The creation of this record is based  on the scribe's personal observations and the providers' statements to them. This document has been checked and approved by the attending provider.

## 2017-06-22 NOTE — Progress Notes (Signed)
Thoracic Location of Tumor / Histology: Stage IIB/IV (T3, N3, M1a) non-small cell lung cancer, squamous cell carcinoma presented with large left hilar mass in addition to mediastinal and left supraclavicular lymphadenopathy as well as contralateral right upper lobe nodule diagnosed in August 2018  Patient had been complaining of increasing shortness breath as well as cough and left-sided chest pain with radiation to the back since middle of July 2018. His condition was getting worse and the patient had an episode of hemoptysis. He presented to his primary care physician order CT scan of the chest and this was performed on 06/05/2017 and it showed left hilar mass highly worrisome for bronchogenic carcinoma.    Tobacco/Marijuana/Snuff/ETOH use: Former smoker. Social drinker.   Past/Anticipated interventions by cardiothoracic surgery, if any:  Video bronchoscopy with endobronchial ultrasound and transbronchial biopsies.  Past/Anticipated interventions by medical oncology, if any: Concurrent chemoradiation with weekly carboplatin for AUC of 2 and paclitaxel 45 MG/M2. First dose 06/30/2017  Signs/Symptoms  Weight changes, if any: Denies  Respiratory complaints, if any: Hoarseness since biopsy two weeks ago. Dry cough. Denies difficulty swallowing.   Hemoptysis, if any: Denies   Pain issues, if any:  Denies  SAFETY ISSUES:  Prior radiation? no  Pacemaker/ICD? no   Possible current pregnancy?no  Is the patient on methotrexate? no  Current Complaints / other details:  51 year old male. NKDA. Married.

## 2017-06-23 ENCOUNTER — Encounter: Payer: Self-pay | Admitting: *Deleted

## 2017-06-23 ENCOUNTER — Other Ambulatory Visit: Payer: Managed Care, Other (non HMO)

## 2017-06-23 ENCOUNTER — Encounter (HOSPITAL_COMMUNITY): Payer: Self-pay

## 2017-06-24 ENCOUNTER — Encounter (HOSPITAL_BASED_OUTPATIENT_CLINIC_OR_DEPARTMENT_OTHER): Payer: Self-pay | Admitting: *Deleted

## 2017-06-24 ENCOUNTER — Ambulatory Visit: Payer: Self-pay | Admitting: Otolaryngology

## 2017-06-24 NOTE — Anesthesia Preprocedure Evaluation (Addendum)
Anesthesia Evaluation  Patient identified by MRN, date of birth, ID band Patient awake    Reviewed: Allergy & Precautions, NPO status , Patient's Chart, lab work & pertinent test results  Airway Mallampati: II  TM Distance: >3 FB Neck ROM: full    Dental  (+) Teeth Intact, Dental Advidsory Given   Pulmonary COPD,  COPD inhaler, Current Smoker,    Pulmonary exam normal breath sounds clear to auscultation       Cardiovascular hypertension, Pt. on medications Normal cardiovascular exam Rhythm:Regular Rate:Normal     Neuro/Psych  Headaches, PSYCHIATRIC DISORDERS Anxiety Depression    GI/Hepatic Neg liver ROS, GERD  Medicated and Controlled,  Endo/Other  negative endocrine ROS  Renal/GU negative Renal ROS  negative genitourinary   Musculoskeletal   Abdominal Normal abdominal exam  (+)   Peds  Hematology negative hematology ROS (+)   Anesthesia Other Findings   Reproductive/Obstetrics                            Lab Results  Component Value Date   WBC 9.4 06/19/2017   HGB 14.8 06/19/2017   HCT 43.7 06/19/2017   MCV 97.7 06/19/2017   PLT 281 06/19/2017   Lab Results  Component Value Date   CREATININE 0.8 06/19/2017   BUN 10.0 06/19/2017   NA 142 06/19/2017   K 5.1 No visable hemolysis 06/19/2017   CL 102 06/06/2017   CO2 30 (H) 06/19/2017    Anesthesia Physical  Anesthesia Plan  ASA: III  Anesthesia Plan: General   Post-op Pain Management:    Induction: Intravenous  PONV Risk Score and Plan: 1 and Ondansetron and Dexamethasone  Airway Management Planned: Oral ETT  Additional Equipment:   Intra-op Plan:   Post-operative Plan: Extubation in OR  Informed Consent: I have reviewed the patients History and Physical, chart, labs and discussed the procedure including the risks, benefits and alternatives for the proposed anesthesia with the patient or authorized representative  who has indicated his/her understanding and acceptance.   Dental Advisory Given  Plan Discussed with: CRNA and Surgeon  Anesthesia Plan Comments:        Anesthesia Quick Evaluation

## 2017-06-24 NOTE — H&P (Signed)
PREOPERATIVE H&P  Chief Complaint: Abnormality noted on recent PET scan  HPI: Trevor Zavala is a 51 y.o. male who presents for evaluation of abnormality noted on recent PET scan. Patient has history of lung cancer getting ready to start radiation therapy. On initial evaluation PET scan there was an abnormality noted in the region of the epiglottis and direct visualization was recommended. On exam in the office patient has an ulcerative lesion on the laryngeal surface of the epiglottis. He's taken to the OR for DL and biopsy.  Past Medical History:  Diagnosis Date  . Anxiety   . COPD (chronic obstructive pulmonary disease) ([place])   . GERD (gastroesophageal reflux disease)   . Headache   . Hypertension   . Pneumonia   . Situational depression   . Stage III squamous cell carcinoma of left lung ([place]) 06/19/2017   Past Surgical History:  Procedure Laterality Date  . COLONOSCOPY    . EAR CYST EXCISION N/A 11/17/2013   Procedure: SEBACEOUS CYST CHEST;  Surgeon: Joyice Faster. Cornett, MD;  Location: [facility];  Service: General;  Laterality: N/A;  . KNEE ARTHROSCOPY     LEFT  . LIPOMA EXCISION N/A 11/17/2013   Procedure: EXCISION LIPOMA FOREHEAD;  Surgeon: Joyice Faster. Cornett, MD;  Location: [facility];  Service: General;  Laterality: N/A;  . LUNG BIOPSY Bilateral 06/12/2017   Procedure: LEFT LUNG BIOPSY;  Surgeon: Grace Isaac, MD;  Location: [facility];  Service: Thoracic;  Laterality: Bilateral;  . VIDEO BRONCHOSCOPY WITH ENDOBRONCHIAL ULTRASOUND N/A 06/12/2017   Procedure: VIDEO BRONCHOSCOPY WITH ENDOBRONCHIAL ULTRASOUND;  Surgeon: Grace Isaac, MD;  Location: [facility];  Service: Thoracic;  Laterality: N/A;   Social History   Social History  . Marital status: Married    Spouse name: N/A  . Number of children: N/A  . Years of education: N/A   Social History Main Topics  . Smoking status: Light Tobacco Smoker    Packs/day: 1.00    Years: 35.00    Last attempt  to quit: 06/06/2017  . Smokeless tobacco: Never Used  . Alcohol use 0.0 oz/week     Comment: SOCIAL  . Drug use: No  . Sexual activity: Yes   Other Topics Concern  . Not on file   Social History Narrative   Daily Caffeine Use:  3   Family History  Problem Relation Age of Onset  . Breast cancer Mother   . Hypertension Mother   . Colon cancer Mother   . Skin cancer Father   . Hypertension Father   . Colon polyps Father   . Cancer Father 79       b cell lymphoma  . Colon cancer Father    No Known Allergies Prior to Admission medications   Medication Sig Start Date End Date Taking? Authorizing Provider  acyclovir (ZOVIRAX) 400 MG tablet TAKE 1 TABLET (400 MG TOTAL) BY MOUTH 3 (THREE) TIMES DAILY. 01/21/16   [name], [name], MD  aspirin (ASPIRIN CHILDRENS) 81 MG chewable tablet Chew 1 tablet (81 mg total) by mouth daily. 02/03/14   [name], [name], MD  BREO ELLIPTA 100-25 MCG/INH AEPB INHALE 1 PUFF INTO THE LUNGS DAILY 01/26/17   [name], [name], MD  Cholecalciferol (VITAMIN D PO) Take 1 tablet by mouth daily.    [provider]  clonazePAM (KLONOPIN) 0.5 MG tablet Take 1 tablet (0.5 mg total) by mouth 2 (two) times daily as needed for anxiety. Patient not taking:  Reported on 06/19/2017 02/19/17   [name], [name], MD  esomeprazole (NEXIUM) 40 MG capsule TAKE 1 CAPSULE BY MOUTH DAILY WITH BREAKFAST 01/16/17   [name], [name], MD  loratadine (CLARITIN) 10 MG tablet Take 10 mg by mouth daily as needed for allergies.    [provider]  naproxen sodium (ANAPROX) 220 MG tablet Take 220 mg by mouth 2 (two) times daily with a meal.    [provider]  nicotine (NICODERM CQ) 21 mg/24hr patch Place 1 patch (21 mg total) onto the skin daily. 06/06/17   Deno Etienne, DO  prochlorperazine (COMPAZINE) 10 MG tablet Take 1 tablet (10 mg total) by mouth every 6 (six) hours as needed for nausea or vomiting. 06/19/17   Curt Bears, MD  valsartan (DIOVAN)  160 MG tablet Take 1 tablet (160 mg total) by mouth daily. 01/16/17 01/16/18  [name], [name], MD     Positive ROS: negative for sore throat  All other systems have been reviewed and were otherwise negative with the exception of those mentioned in the HPI and as above.  Physical Exam: There were no vitals filed for this visit.  General: Alert, no acute distress Oral: Normal oral mucosa and tonsils Nasal: Clear nasal passages. Fiberoptic laryngoscopy was performed through the left nostril. Patient has a shallow ulcerative lesion on the laryngeal surface of epiglottis. Vocal cords were clear. Neck: No palpable adenopathy or thyroid nodules Ear: Ear canal is clear with normal appearing TMs Cardiovascular: Regular rate and rhythm, no murmur.  Respiratory: Clear to auscultation Neurologic: Alert and oriented x 3   Assessment/Plan: HORSENESS Plan for Procedure(s): [place], MD 06/24/2017 11:56 AM

## 2017-06-24 NOTE — Progress Notes (Signed)
  Radiation Oncology         [phone] ________________________________  Name: Trevor Zavala MRN: [id number]  Date: 06/25/2017  DOB: 1966-07-21  Chart Note:  See the phone call today from Dr. Melony Overly from ENT. This patient was referred to ENT regarding PET scan uptake in the epiglottic area. Dr. 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 He reports that it is relatively small and subtle. The patient is scheduled to proceed with direct laryngoscopy and biopsies tomorrow in the OR at 7 AM. A frozen section during the procedure may provide some guidance regarding the etiology of the finding. At this point, we will plan to proceed with CT simulation of the chest at 1 PM. Given this additional information, we will set up the patient using an extended neck arrangement and possibly an aqua plastic mask in order to facilitate treatment of the supraglottic larynx, if clinically appropriate.  ________________________________  Sheral Apley. Tammi Klippel, M.D.

## 2017-06-25 ENCOUNTER — Encounter (HOSPITAL_BASED_OUTPATIENT_CLINIC_OR_DEPARTMENT_OTHER)
Admission: RE | Disposition: A | Discharge status: Home / Self Care | Payer: Self-pay | Source: Ambulatory Visit | Attending: Otolaryngology

## 2017-06-25 ENCOUNTER — Ambulatory Visit (HOSPITAL_BASED_OUTPATIENT_CLINIC_OR_DEPARTMENT_OTHER): Payer: Managed Care, Other (non HMO) | Admitting: Anesthesiology

## 2017-06-25 ENCOUNTER — Ambulatory Visit
Admission: RE | Admit: 2017-06-25 | Discharge: 2017-06-25 | Disposition: A | Discharge status: Home / Self Care | Payer: Managed Care, Other (non HMO) | Source: Ambulatory Visit | Attending: Radiation Oncology | Admitting: Radiation Oncology

## 2017-06-25 ENCOUNTER — Ambulatory Visit (HOSPITAL_BASED_OUTPATIENT_CLINIC_OR_DEPARTMENT_OTHER)
Admission: RE | Admit: 2017-06-25 | Discharge: 2017-06-25 | Disposition: A | Discharge status: Home / Self Care | Payer: Managed Care, Other (non HMO) | Source: Ambulatory Visit | Attending: Otolaryngology | Admitting: Otolaryngology

## 2017-06-25 ENCOUNTER — Encounter (HOSPITAL_BASED_OUTPATIENT_CLINIC_OR_DEPARTMENT_OTHER): Payer: Self-pay | Admitting: *Deleted

## 2017-06-25 DIAGNOSIS — Z510 Encounter for antineoplastic radiation therapy: Secondary | ICD-10-CM | POA: Diagnosis not present

## 2017-06-25 DIAGNOSIS — C3412 Malignant neoplasm of upper lobe, left bronchus or lung: Secondary | ICD-10-CM

## 2017-06-25 DIAGNOSIS — F17210 Nicotine dependence, cigarettes, uncomplicated: Secondary | ICD-10-CM | POA: Diagnosis not present

## 2017-06-25 DIAGNOSIS — K219 Gastro-esophageal reflux disease without esophagitis: Secondary | ICD-10-CM | POA: Diagnosis not present

## 2017-06-25 DIAGNOSIS — C321 Malignant neoplasm of supraglottis: Secondary | ICD-10-CM | POA: Insufficient documentation

## 2017-06-25 DIAGNOSIS — Z79899 Other long term (current) drug therapy: Secondary | ICD-10-CM | POA: Diagnosis not present

## 2017-06-25 DIAGNOSIS — Z7982 Long term (current) use of aspirin: Secondary | ICD-10-CM | POA: Diagnosis not present

## 2017-06-25 DIAGNOSIS — J449 Chronic obstructive pulmonary disease, unspecified: Secondary | ICD-10-CM | POA: Diagnosis not present

## 2017-06-25 DIAGNOSIS — R490 Dysphonia: Secondary | ICD-10-CM | POA: Diagnosis present

## 2017-06-25 DIAGNOSIS — F419 Anxiety disorder, unspecified: Secondary | ICD-10-CM | POA: Insufficient documentation

## 2017-06-25 DIAGNOSIS — C3490 Malignant neoplasm of unspecified part of unspecified bronchus or lung: Secondary | ICD-10-CM | POA: Insufficient documentation

## 2017-06-25 DIAGNOSIS — I10 Essential (primary) hypertension: Secondary | ICD-10-CM | POA: Insufficient documentation

## 2017-06-25 HISTORY — PX: DIRECT LARYNGOSCOPY: SHX5326

## 2017-06-25 SURGERY — LARYNGOSCOPY, DIRECT
Anesthesia: General | Site: Throat

## 2017-06-25 SURGERY — Surgical Case
Anesthesia: *Unknown

## 2017-06-25 MED ORDER — MIDAZOLAM HCL 2 MG/2ML IJ SOLN
INTRAMUSCULAR | Status: AC
  Filled 2017-06-25: qty 2

## 2017-06-25 MED ORDER — LIDOCAINE 2% (20 MG/ML) 5 ML SYRINGE
INTRAMUSCULAR | Status: DC | PRN
  Administered 2017-06-25: 100 mg via INTRAVENOUS

## 2017-06-25 MED ORDER — CEFAZOLIN SODIUM-DEXTROSE 2-4 GM/100ML-% IV SOLN
2.0000 g | INTRAVENOUS | Status: AC
  Administered 2017-06-25: 2 g via INTRAVENOUS

## 2017-06-25 MED ORDER — PROPOFOL 10 MG/ML IV BOLUS
INTRAVENOUS | Status: AC
  Filled 2017-06-25: qty 20

## 2017-06-25 MED ORDER — ACETAMINOPHEN 500 MG PO TABS
1000.0000 mg | ORAL_TABLET | Freq: Once | ORAL | Status: AC
  Administered 2017-06-25: 1000 mg via ORAL

## 2017-06-25 MED ORDER — LACTATED RINGERS IV SOLN
INTRAVENOUS | Status: DC
  Administered 2017-06-25 (×2): via INTRAVENOUS

## 2017-06-25 MED ORDER — OXYCODONE HCL 5 MG/5ML PO SOLN: 5.0000 mg | Freq: Once | ORAL | Status: DC | PRN

## 2017-06-25 MED ORDER — SCOPOLAMINE 1 MG/3DAYS TD PT72: 1.0000 | MEDICATED_PATCH | Freq: Once | TRANSDERMAL | Status: DC | PRN

## 2017-06-25 MED ORDER — FENTANYL CITRATE (PF) 100 MCG/2ML IJ SOLN
INTRAMUSCULAR | Status: AC
  Filled 2017-06-25: qty 2

## 2017-06-25 MED ORDER — ONDANSETRON HCL 4 MG/2ML IJ SOLN: 4.0000 mg | Freq: Once | INTRAMUSCULAR | Status: DC | PRN

## 2017-06-25 MED ORDER — CHLORHEXIDINE GLUCONATE CLOTH 2 % EX PADS: 6.0000 | MEDICATED_PAD | Freq: Once | CUTANEOUS | Status: DC

## 2017-06-25 MED ORDER — KETOROLAC TROMETHAMINE 30 MG/ML IJ SOLN: 30.0000 mg | Freq: Once | INTRAMUSCULAR | Status: DC | PRN

## 2017-06-25 MED ORDER — MEPERIDINE HCL 25 MG/ML IJ SOLN: 6.2500 mg | INTRAMUSCULAR | Status: DC | PRN

## 2017-06-25 MED ORDER — ACETAMINOPHEN 160 MG/5ML PO SOLN: 325.0000 mg | ORAL | Status: DC | PRN

## 2017-06-25 MED ORDER — DEXAMETHASONE SODIUM PHOSPHATE 10 MG/ML IJ SOLN
INTRAMUSCULAR | Status: AC
  Filled 2017-06-25: qty 1

## 2017-06-25 MED ORDER — EPINEPHRINE PF 1 MG/ML IJ SOLN
INTRAMUSCULAR | Status: DC | PRN
  Administered 2017-06-25: 1 mg

## 2017-06-25 MED ORDER — HYDROMORPHONE HCL 1 MG/ML IJ SOLN: 0.2500 mg | INTRAMUSCULAR | Status: DC | PRN

## 2017-06-25 MED ORDER — SUGAMMADEX SODIUM 500 MG/5ML IV SOLN
INTRAVENOUS | Status: DC | PRN
  Administered 2017-06-25: 200 mg via INTRAVENOUS

## 2017-06-25 MED ORDER — ROCURONIUM BROMIDE 10 MG/ML (PF) SYRINGE
PREFILLED_SYRINGE | INTRAVENOUS | Status: AC
  Filled 2017-06-25: qty 5

## 2017-06-25 MED ORDER — PROMETHAZINE HCL 25 MG/ML IJ SOLN: 6.2500 mg | INTRAMUSCULAR | Status: DC | PRN

## 2017-06-25 MED ORDER — FENTANYL CITRATE (PF) 100 MCG/2ML IJ SOLN
50.0000 ug | INTRAMUSCULAR | Status: DC | PRN
  Administered 2017-06-25: 50 ug via INTRAVENOUS
  Administered 2017-06-25: 100 ug via INTRAVENOUS

## 2017-06-25 MED ORDER — MIDAZOLAM HCL 2 MG/2ML IJ SOLN
1.0000 mg | INTRAMUSCULAR | Status: DC | PRN
  Administered 2017-06-25: 2 mg via INTRAVENOUS

## 2017-06-25 MED ORDER — DEXAMETHASONE SODIUM PHOSPHATE 4 MG/ML IJ SOLN
INTRAMUSCULAR | Status: DC | PRN
  Administered 2017-06-25: 10 mg via INTRAVENOUS

## 2017-06-25 MED ORDER — FENTANYL CITRATE (PF) 100 MCG/2ML IJ SOLN: 25.0000 ug | INTRAMUSCULAR | Status: DC | PRN

## 2017-06-25 MED ORDER — LIDOCAINE 2% (20 MG/ML) 5 ML SYRINGE
INTRAMUSCULAR | Status: AC
  Filled 2017-06-25: qty 5

## 2017-06-25 MED ORDER — ACETAMINOPHEN 325 MG PO TABS: 325.0000 mg | ORAL_TABLET | ORAL | Status: DC | PRN

## 2017-06-25 MED ORDER — OXYCODONE HCL 5 MG PO TABS: 5.0000 mg | ORAL_TABLET | Freq: Once | ORAL | Status: DC | PRN

## 2017-06-25 MED ORDER — ONDANSETRON HCL 4 MG/2ML IJ SOLN
INTRAMUSCULAR | Status: AC
  Filled 2017-06-25: qty 2

## 2017-06-25 MED ORDER — EPINEPHRINE 30 MG/30ML IJ SOLN
INTRAMUSCULAR | Status: AC
  Filled 2017-06-25: qty 1

## 2017-06-25 MED ORDER — CEFAZOLIN SODIUM-DEXTROSE 2-4 GM/100ML-% IV SOLN
INTRAVENOUS | Status: AC
  Filled 2017-06-25: qty 100

## 2017-06-25 MED ORDER — HYDROCODONE-ACETAMINOPHEN 5-325 MG PO TABS: 1.0000 | ORAL_TABLET | Freq: Four times a day (QID) | ORAL | 0 refills | Status: DC | PRN

## 2017-06-25 MED ORDER — ACETAMINOPHEN 500 MG PO TABS
ORAL_TABLET | ORAL | Status: AC
  Filled 2017-06-25: qty 2

## 2017-06-25 MED ORDER — PROPOFOL 10 MG/ML IV BOLUS
INTRAVENOUS | Status: DC | PRN
Start: 2017-06-25 — End: 2017-06-25
  Administered 2017-06-25: 200 mg via INTRAVENOUS
  Administered 2017-06-25: 20 mg via INTRAVENOUS

## 2017-06-25 MED ORDER — ROCURONIUM BROMIDE 100 MG/10ML IV SOLN
INTRAVENOUS | Status: DC | PRN
  Administered 2017-06-25: 40 mg via INTRAVENOUS

## 2017-06-25 MED ORDER — ONDANSETRON HCL 4 MG/2ML IJ SOLN
INTRAMUSCULAR | Status: DC | PRN
  Administered 2017-06-25: 4 mg via INTRAVENOUS

## 2017-06-25 MED FILL — HYDROCODON-APAP 5-325: 5-325 | 2 days supply | Qty: 18 | Fill #0

## 2017-06-25 SURGICAL SUPPLY — 29 items
CANISTER SUCT 1200ML W/VALVE (MISCELLANEOUS) ×3 IMPLANT
CONT SPEC 4OZ CLIKSEAL STRL BL (MISCELLANEOUS) IMPLANT
GAUZE SPONGE 4X4 12PLY STRL LF (GAUZE/BANDAGES/DRESSINGS) ×6 IMPLANT
GLOVE SS BIOGEL STRL SZ 7.5 (GLOVE) ×1 IMPLANT
GLOVE SUPERSENSE BIOGEL SZ 7.5 (GLOVE) ×2
GLOVE SURG SS PI 7.0 STRL IVOR (GLOVE) ×2 IMPLANT
GOWN STRL REUS W/ TWL LRG LVL3 (GOWN DISPOSABLE) IMPLANT
GOWN STRL REUS W/ TWL XL LVL3 (GOWN DISPOSABLE) IMPLANT
GOWN STRL REUS W/TWL LRG LVL3 (GOWN DISPOSABLE) ×3
GOWN STRL REUS W/TWL XL LVL3 (GOWN DISPOSABLE)
GUARD TEETH (MISCELLANEOUS) ×3 IMPLANT
MARKER SKIN DUAL TIP RULER LAB (MISCELLANEOUS) IMPLANT
NDL SAFETY ECLIPSE 18X1.5 (NEEDLE) ×1 IMPLANT
NDL SPNL 22GX7 QUINCKE BK (NEEDLE) IMPLANT
NEEDLE HYPO 18GX1.5 SHARP (NEEDLE) ×3
NEEDLE SPNL 22GX7 QUINCKE BK (NEEDLE) IMPLANT
NS IRRIG 1000ML POUR BTL (IV SOLUTION) ×3 IMPLANT
PACK BASIN DAY SURGERY FS (CUSTOM PROCEDURE TRAY) ×3 IMPLANT
PATTIES SURGICAL .5 X3 (DISPOSABLE) ×3 IMPLANT
SHEET MEDIUM DRAPE 40X70 STRL (DRAPES) ×3 IMPLANT
SLEEVE SCD COMPRESS KNEE MED (MISCELLANEOUS) ×3 IMPLANT
SOLUTION ANTI FOG 6CC (MISCELLANEOUS) IMPLANT
SOLUTION BUTLER CLEAR DIP (MISCELLANEOUS) ×3 IMPLANT
SURGILUBE 2OZ TUBE FLIPTOP (MISCELLANEOUS) IMPLANT
SYR 5ML LL (SYRINGE) ×3 IMPLANT
SYR CONTROL 10ML LL (SYRINGE) IMPLANT
TOWEL OR 17X24 6PK STRL BLUE (TOWEL DISPOSABLE) ×3 IMPLANT
TUBE CONNECTING 20'X1/4 (TUBING) ×1
TUBE CONNECTING 20X1/4 (TUBING) ×2 IMPLANT

## 2017-06-25 NOTE — Op Note (Deleted)
  The note originally documented on this encounter has been moved the the encounter in which it belongs.  

## 2017-06-25 NOTE — Anesthesia Procedure Notes (Signed)
Procedure Name: Intubation Date/Time: 06/25/2017 7:48 AM Performed by: Maryella Shivers Pre-anesthesia Checklist: Patient identified, Emergency Drugs available, Suction available and Patient being monitored Patient Re-evaluated:Patient Re-evaluated prior to induction Oxygen Delivery Method: Circle system utilized Preoxygenation: Pre-oxygenation with 100% oxygen Induction Type: IV induction Ventilation: Mask ventilation without difficulty Laryngoscope Size: Mac and 3 Grade View: Grade I Tube type: Oral Tube size: 6.0 mm Number of attempts: 1 Airway Equipment and Method: Stylet and Oral airway Placement Confirmation: ETT inserted through vocal cords under direct vision,  positive ETCO2 and breath sounds checked- equal and bilateral Secured at: 22 cm Tube secured with: Tape Dental Injury: Teeth and Oropharynx as per pre-operative assessment

## 2017-06-25 NOTE — Brief Op Note (Signed)
06/25/2017  8:20 AM  PATIENT:  [name]  51 y.o. male  PRE-OPERATIVE DIAGNOSIS:  HORSENESS  POST-OPERATIVE DIAGNOSIS:  HORSENESS  PROCEDURE:  Procedure(s): DIRECT LARYNGOSCOPY AND BIOPSY (N/A)  SURGEON:  Surgeon(s) and Role:    * Rozetta Nunnery, MD - Primary  PHYSICIAN ASSISTANT:   ASSISTANTS: none   ANESTHESIA:   general  EBL:  Total I/O In: 200 [I.V.:200] Out: -   BLOOD ADMINISTERED:none  DRAINS: none   LOCAL MEDICATIONS USED:  NONE  SPECIMEN:  Source of Specimen:  epiglottis  DISPOSITION OF SPECIMEN:  Source of Specimen:  laryngeal surface of epiglottis  COUNTS:  YES  TOURNIQUET:  * No tourniquets in log *  DICTATION: .Other Dictation: Dictation Number [id number]  PLAN OF CARE: Discharge to home after PACU  PATIENT DISPOSITION:  PACU - hemodynamically stable.   Delay start of Pharmacological VTE agent (>24hrs) due to surgical blood loss or risk of bleeding: yes

## 2017-06-25 NOTE — Discharge Instructions (Signed)

## 2017-06-25 NOTE — Op Note (Signed)
NAMEJHAMIR, PICKUP                ACCOUNT NO.:  [id number]  MEDICAL RECORD NO.:  [id number]  LOCATION:                                 FACILITY:  PHYSICIAN:  Leonides Sake. Lucia Gaskins, M.D.DATE OF BIRTH:  08-05-1966  DATE OF PROCEDURE:  06/25/2017 DATE OF DISCHARGE:  06/25/2017                              OPERATIVE REPORT   PREOPERATIVE DIAGNOSIS:  Epiglottic lesion.  POSTOPERATIVE DIAGNOSIS:  Epiglottic lesion.  OPERATION PERFORMED:  Direct laryngoscopy and biopsy.  SURGEON:  Leonides Sake. Lucia Gaskins, M.D.  ANESTHESIA:  General endotracheal.  COMPLICATIONS:  None.  BRIEF CLINICAL NOTE:  Trevor Zavala is a 51 year old gentleman, who has a known lung cancer and on pretreatment protocol.  He had a PET scan that showed abnormal activity in the supraglottic area and on exam in the office, the patient has an ulcerative lesion on the laryngeal surface of the epiglottis.  He was taken to the operating room at this time for direct laryngoscopy and biopsy of epiglottic lesion.  DESCRIPTION OF PROCEDURE:  After adequate endotracheal anesthesia, direct laryngoscopy was performed.  The base of tongue, vallecula, and glossal surface of the epiglottis were clear.  Both piriform sinuses were clear.  On the laryngeal surface of the epiglottis, the patient had a superficially ulcerative lesion that extended on both sides of the epiglottis and extended down to about the level of the false cords.  The true cords did not appear to be involved.  Several biopsies were obtained from the laryngeal surface of the epiglottis and sent to Pathology.  Photos were obtained.  Following the biopsies, cotton pledget soaked in adrenaline was placed for hemostasis.  This completed the procedure.  The patient was subsequently awoken from anesthesia and transferred to recovery room, postop doing well.  DISPOSITION:  The patient is discharged home later this morning.  He is scheduled at this time to follow up with  Radiation Oncology later this afternoon.  He was instructed to take Tylenol and Motrin p.r.n. pain. He was also given some hydrocodone tablets for more severe pain.    ______________________________ Leonides Sake. Lucia Gaskins, M.D.   ______________________________ Leonides Sake. Lucia Gaskins, M.D.    CEN/MEDQ  D:  06/25/2017  T:  06/25/2017  Job:  [id number]

## 2017-06-25 NOTE — Anesthesia Postprocedure Evaluation (Signed)
Anesthesia Post Note  Patient: Trevor Zavala  Procedure(s) Performed: Procedure(s) (LRB): DIRECT LARYNGOSCOPY AND BIOPSY (N/A)     Patient location during evaluation: PACU Anesthesia Type: General Level of consciousness: awake Pain management: pain level controlled Vital Signs Assessment: post-procedure vital signs reviewed and stable Respiratory status: spontaneous breathing Cardiovascular status: stable Postop Assessment: no signs of nausea or vomiting Anesthetic complications: no    Last Vitals:  Vitals:   06/25/17 0845 06/25/17 0846  BP: (!) 127/105 129/83  Pulse: 86 85  Resp: 18 (!) 21  Temp:    SpO2: 100% 100%    Last Pain:  Vitals:   06/25/17 0845  TempSrc:   PainSc: 2    Pain Goal: Patients Stated Pain Goal: 0 (06/25/17 0845)               [place]

## 2017-06-25 NOTE — Progress Notes (Signed)
  Radiation Oncology         [phone] ________________________________  Name: BOND GRIESHOP MRN: [id number]  Date: 06/25/2017  DOB: [birth year]  SIMULATION AND TREATMENT PLANNING NOTE    ICD-10-CM   1. 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  Identity was confirmed.  All relevant records and images related to the planned course of therapy were reviewed.  The patient freely provided informed written consent to proceed with treatment after reviewing the details related to the planned course of therapy. The consent form was witnessed and verified by the simulation staff.  Then, the patient was set-up in a stable reproducible  supine position for radiation therapy.  CT images were obtained.  Surface markings were placed.  The CT images were loaded into the planning software.  Then the target and avoidance structures were contoured.  Treatment planning then occurred.  The radiation prescription was entered and confirmed.  Then, I designed and supervised the construction of a total of 6 medically necessary complex treatment devices, including a BodyFix immobilization mold custom fitted to the patient along with 5 multileaf collimators conformally shaped radiation around the treatment target while shielding critical structures such as the heart and spinal cord maximally.  I have requested : 3D Simulation  I have requested a DVH of the following structures: Left lung, right lung, spinal cord, heart, esophagus, and target.  I have ordered:Nutrition Consult  SPECIAL TREATMENT PROCEDURE:  The planned course of therapy using radiation constitutes a special  treatment procedure. Special care is required in the management of this patient for the following reasons.  The patient will be receiving concurrent chemotherapy requiring careful monitoring for increased toxicities of treatment including periodic laboratory values.  The special nature of the planned course of radiotherapy will require increased physician supervision and oversight to ensure patient's safety with optimal treatment outcomes.  PLAN:  The patient will receive 66 Gy in 33 fractions.  ________________________________  Sheral Apley Tammi Klippel, M.D.  This document serves as a record of services personally performed by Tyler Pita, MD. It was created on his behalf by Valeta Harms, a trained medical scribe. The creation of this record is based on the scribe's personal observations and the provider's statements to them. This document has been checked and approved by the attending provider.

## 2017-06-25 NOTE — Interval H&P Note (Signed)
History and Physical Interval Note:  06/25/2017 7:35 AM  Trevor Zavala  has presented today for surgery, with the diagnosis of HORSENESS  The various methods of treatment have been discussed with the patient and family. After consideration of risks, benefits and other options for treatment, the patient has consented to  Procedure(s): DIRECT LARYNGOSCOPY AND BIOPSY (N/A) as a surgical intervention .  The patient's history has been reviewed, patient examined, no change in status, stable for surgery.  I have reviewed the patient's chart and labs.  Questions were answered to the patient's satisfaction.     Casin Federici

## 2017-06-25 NOTE — Transfer of Care (Signed)
Immediate Anesthesia Transfer of Care Note  Patient: Trevor Zavala  Procedure(s) Performed: Procedure(s): DIRECT LARYNGOSCOPY AND BIOPSY (N/A)  Patient Location: PACU  Anesthesia Type:General  Level of Consciousness: awake, alert  and oriented  Airway & Oxygen Therapy: Patient Spontanous Breathing and Patient connected to face mask oxygen  Post-op Assessment: Report given to RN and Post -op Vital signs reviewed and stable  Post vital signs: Reviewed and stable  Last Vitals:  Vitals:   06/25/17 0639 06/25/17 0831  BP: (!) 146/79   Pulse: 78   Resp: 17   Temp: 36.9 C (P) 36.6 C  SpO2: 97%     Last Pain:  Vitals:   06/25/17 0639  TempSrc: Oral  PainSc: 5       Patients Stated Pain Goal: 2 (43/20/03 7944)  Complications: No apparent anesthesia complications

## 2017-06-26 ENCOUNTER — Encounter (HOSPITAL_BASED_OUTPATIENT_CLINIC_OR_DEPARTMENT_OTHER): Payer: Self-pay | Admitting: Otolaryngology

## 2017-06-26 ENCOUNTER — Encounter: Payer: Self-pay | Admitting: *Deleted

## 2017-06-26 NOTE — Progress Notes (Signed)
Oncology Nurse Navigator Documentation  Oncology Nurse Navigator Flowsheets 06/26/2017  Navigator Location [facility]  Navigator Encounter Type Other/I updated Dr. Julien Nordmann on recent biopsy results  Treatment Phase Pre-Tx/Tx Discussion  Barriers/Navigation Needs Coordination of Care  Interventions Coordination of Care  Coordination of Care Other  Acuity Level 1  Time Spent with Patient 15

## 2017-06-29 DIAGNOSIS — Z510 Encounter for antineoplastic radiation therapy: Secondary | ICD-10-CM | POA: Diagnosis not present

## 2017-06-30 ENCOUNTER — Other Ambulatory Visit (HOSPITAL_BASED_OUTPATIENT_CLINIC_OR_DEPARTMENT_OTHER): Payer: Managed Care, Other (non HMO)

## 2017-06-30 ENCOUNTER — Other Ambulatory Visit: Payer: Self-pay | Admitting: *Deleted

## 2017-06-30 ENCOUNTER — Encounter (HOSPITAL_COMMUNITY): Payer: Self-pay | Admitting: *Deleted

## 2017-06-30 ENCOUNTER — Ambulatory Visit (HOSPITAL_BASED_OUTPATIENT_CLINIC_OR_DEPARTMENT_OTHER): Payer: Managed Care, Other (non HMO)

## 2017-06-30 ENCOUNTER — Ambulatory Visit
Admission: RE | Admit: 2017-06-30 | Discharge: 2017-06-30 | Disposition: A | Discharge status: Home / Self Care | Payer: Managed Care, Other (non HMO) | Source: Ambulatory Visit | Attending: Radiation Oncology | Admitting: Radiation Oncology

## 2017-06-30 ENCOUNTER — Encounter: Payer: Self-pay | Admitting: *Deleted

## 2017-06-30 VITALS — BP 116/78 | HR 80 | Temp 99.5°F | Resp 16 | Ht 70.0 in

## 2017-06-30 DIAGNOSIS — C3492 Malignant neoplasm of unspecified part of left bronchus or lung: Secondary | ICD-10-CM

## 2017-06-30 DIAGNOSIS — C3412 Malignant neoplasm of upper lobe, left bronchus or lung: Secondary | ICD-10-CM | POA: Diagnosis not present

## 2017-06-30 DIAGNOSIS — Z5111 Encounter for antineoplastic chemotherapy: Secondary | ICD-10-CM

## 2017-06-30 DIAGNOSIS — Z510 Encounter for antineoplastic radiation therapy: Secondary | ICD-10-CM | POA: Diagnosis not present

## 2017-06-30 DIAGNOSIS — C3490 Malignant neoplasm of unspecified part of unspecified bronchus or lung: Secondary | ICD-10-CM

## 2017-06-30 LAB — CBC WITH DIFFERENTIAL/PLATELET
BASO%: 0.3 % (ref 0.0–2.0)
Basophils Absolute: 0 10*3/uL (ref 0.0–0.1)
EOS%: 0.9 % (ref 0.0–7.0)
Eosinophils Absolute: 0.1 10*3/uL (ref 0.0–0.5)
HEMATOCRIT: 44.8 % (ref 38.4–49.9)
HEMOGLOBIN: 15.1 g/dL (ref 13.0–17.1)
LYMPH#: 1.8 10*3/uL (ref 0.9–3.3)
LYMPH%: 14.9 % (ref 14.0–49.0)
MCH: 32.7 pg (ref 27.2–33.4)
MCHC: 33.8 g/dL (ref 32.0–36.0)
MCV: 96.9 fL (ref 79.3–98.0)
MONO#: 1 10*3/uL — ABNORMAL HIGH (ref 0.1–0.9)
MONO%: 8.4 % (ref 0.0–14.0)
NEUT%: 75.5 % — ABNORMAL HIGH (ref 39.0–75.0)
NEUTROS ABS: 9 10*3/uL — AB (ref 1.5–6.5)
Platelets: 285 10*3/uL (ref 140–400)
RBC: 4.62 10*6/uL (ref 4.20–5.82)
RDW: 13 % (ref 11.0–14.6)
WBC: 11.9 10*3/uL — AB (ref 4.0–10.3)

## 2017-06-30 LAB — COMPREHENSIVE METABOLIC PANEL
ALBUMIN: 3.4 g/dL — AB (ref 3.5–5.0)
ALK PHOS: 83 U/L (ref 40–150)
ALT: 21 U/L (ref 0–55)
AST: 17 U/L (ref 5–34)
Anion Gap: 7 mEq/L (ref 3–11)
BILIRUBIN TOTAL: 0.42 mg/dL (ref 0.20–1.20)
BUN: 12.6 mg/dL (ref 7.0–26.0)
CO2: 29 mEq/L (ref 22–29)
CREATININE: 0.7 mg/dL (ref 0.7–1.3)
Calcium: 10.1 mg/dL (ref 8.4–10.4)
Chloride: 105 mEq/L (ref 98–109)
EGFR: >90 mL/min/{1.73_m2} (ref >90)
GLUCOSE: 92 mg/dL (ref 70–140)
POTASSIUM: 3.8 meq/L (ref 3.5–5.1)
SODIUM: 142 meq/L (ref 136–145)
TOTAL PROTEIN: 6.5 g/dL (ref 6.4–8.3)

## 2017-06-30 MED ORDER — FAMOTIDINE IN NACL 20-0.9 MG/50ML-% IV SOLN
20.0000 mg | Freq: Once | INTRAVENOUS | Status: AC
  Administered 2017-06-30: 20 mg via INTRAVENOUS

## 2017-06-30 MED ORDER — SODIUM CHLORIDE 0.9 % IV SOLN
277.0000 mg | Freq: Once | INTRAVENOUS | Status: AC
  Administered 2017-06-30: 280 mg via INTRAVENOUS
  Filled 2017-06-30: qty 28

## 2017-06-30 MED ORDER — PALONOSETRON HCL INJECTION 0.25 MG/5ML
INTRAVENOUS | Status: AC
  Filled 2017-06-30: qty 5

## 2017-06-30 MED ORDER — SODIUM CHLORIDE 0.9 % IV SOLN
Freq: Once | INTRAVENOUS | Status: AC
  Administered 2017-06-30: 10:00:00 via INTRAVENOUS

## 2017-06-30 MED ORDER — SODIUM CHLORIDE 0.9 % IV SOLN
20.0000 mg | Freq: Once | INTRAVENOUS | Status: AC
  Administered 2017-06-30: 20 mg via INTRAVENOUS
  Filled 2017-06-30: qty 2

## 2017-06-30 MED ORDER — DIPHENHYDRAMINE HCL 50 MG/ML IJ SOLN
INTRAMUSCULAR | Status: AC
  Filled 2017-06-30: qty 1

## 2017-06-30 MED ORDER — FAMOTIDINE IN NACL 20-0.9 MG/50ML-% IV SOLN
INTRAVENOUS | Status: AC
  Filled 2017-06-30: qty 50

## 2017-06-30 MED ORDER — DEXAMETHASONE SODIUM PHOSPHATE 10 MG/ML IJ SOLN
INTRAMUSCULAR | Status: AC
  Filled 2017-06-30: qty 1

## 2017-06-30 MED ORDER — PALONOSETRON HCL INJECTION 0.25 MG/5ML
0.2500 mg | Freq: Once | INTRAVENOUS | Status: AC
  Administered 2017-06-30: 0.25 mg via INTRAVENOUS

## 2017-06-30 MED ORDER — DIPHENHYDRAMINE HCL 50 MG/ML IJ SOLN
50.0000 mg | Freq: Once | INTRAMUSCULAR | Status: AC
  Administered 2017-06-30: 50 mg via INTRAVENOUS

## 2017-06-30 MED ORDER — PACLITAXEL CHEMO INJECTION 300 MG/50ML
45.0000 mg/m2 | Freq: Once | INTRAVENOUS | Status: AC
  Administered 2017-06-30: 84 mg via INTRAVENOUS
  Filled 2017-06-30: qty 14

## 2017-06-30 NOTE — Patient Instructions (Signed)
[facility] Discharge Instructions for Patients Receiving Chemotherapy  Today you received the following chemotherapy agents Taxol and Carboplatin  To help prevent nausea and vomiting after your treatment, we encourage you to take your nausea medication as directed   Paclitaxel injection What is this medicine? PACLITAXEL (PAK li TAX el) is a chemotherapy drug. It targets fast dividing cells, like cancer cells, and causes these cells to die. This medicine is used to treat ovarian cancer, breast cancer, and other cancers. This medicine may be used for other purposes; ask your health care provider or pharmacist if you have questions. COMMON BRAND NAME(S): Onxol, Taxol What should I tell my health care provider before I take this medicine? They need to know if you have any of these conditions: -blood disorders -irregular heartbeat -infection (especially a virus infection such as chickenpox, cold sores, or herpes) -liver disease -previous or ongoing radiation therapy -an unusual or allergic reaction to paclitaxel, alcohol, polyoxyethylated castor oil, other chemotherapy agents, other medicines, foods, dyes, or preservatives -pregnant or trying to get pregnant -breast-feeding How should I use this medicine? This drug is given as an infusion into a vein. It is administered in a hospital or clinic by a specially trained health care professional. Talk to your pediatrician regarding the use of this medicine in children. Special care may be needed. Overdosage: If you think you have taken too much of this medicine contact a poison control center or emergency room at once. NOTE: This medicine is only for you. Do not share this medicine with others. What if I miss a dose? It is important not to miss your dose. Call your doctor or health care professional if you are unable to keep an appointment. What may interact with this medicine? Do not take this medicine with any of the following  medications: -disulfiram -metronidazole This medicine may also interact with the following medications: -cyclosporine -diazepam -ketoconazole -medicines to increase blood counts like filgrastim, pegfilgrastim, sargramostim -other chemotherapy drugs like cisplatin, doxorubicin, epirubicin, etoposide, teniposide, vincristine -quinidine -testosterone -vaccines -verapamil Talk to your doctor or health care professional before taking any of these medicines: -acetaminophen -aspirin -ibuprofen -ketoprofen -naproxen This list may not describe all possible interactions. Give your health care provider a list of all the medicines, herbs, non-prescription drugs, or dietary supplements you use. Also tell them if you smoke, drink alcohol, or use illegal drugs. Some items may interact with your medicine. What should I watch for while using this medicine? Your condition will be monitored carefully while you are receiving this medicine. You will need important blood work done while you are taking this medicine. This medicine can cause serious allergic reactions. To reduce your risk you will need to take other medicine(s) before treatment with this medicine. If you experience allergic reactions like skin rash, itching or hives, swelling of the face, lips, or tongue, tell your doctor or health care professional right away. In some cases, you may be given additional medicines to help with side effects. Follow all directions for their use. This drug may make you feel generally unwell. This is not uncommon, as chemotherapy can affect healthy cells as well as cancer cells. Report any side effects. Continue your course of treatment even though you feel ill unless your doctor tells you to stop. Call your doctor or health care professional for advice if you get a fever, chills or sore throat, or other symptoms of a cold or flu. Do not treat yourself. This drug decreases your body's ability  to fight infections. Try to  avoid being around people who are sick. This medicine may increase your risk to bruise or bleed. Call your doctor or health care professional if you notice any unusual bleeding. Be careful brushing and flossing your teeth or using a toothpick because you may get an infection or bleed more easily. If you have any dental work done, tell your dentist you are receiving this medicine. Avoid taking products that contain aspirin, acetaminophen, ibuprofen, naproxen, or ketoprofen unless instructed by your doctor. These medicines may hide a fever. Do not become pregnant while taking this medicine. Women should inform their doctor if they wish to become pregnant or think they might be pregnant. There is a potential for serious side effects to an unborn child. Talk to your health care professional or pharmacist for more information. Do not breast-feed an infant while taking this medicine. Men are advised not to father a child while receiving this medicine. This product may contain alcohol. Ask your pharmacist or healthcare provider if this medicine contains alcohol. Be sure to tell all healthcare providers you are taking this medicine. Certain medicines, like metronidazole and disulfiram, can cause an unpleasant reaction when taken with alcohol. The reaction includes flushing, headache, nausea, vomiting, sweating, and increased thirst. The reaction can last from 30 minutes to several hours. What side effects may I notice from receiving this medicine? Side effects that you should report to your doctor or health care professional as soon as possible: -allergic reactions like skin rash, itching or hives, swelling of the face, lips, or tongue -low blood counts - This drug may decrease the number of white blood cells, red blood cells and platelets. You may be at increased risk for infections and bleeding. -signs of infection - fever or chills, cough, sore throat, pain or difficulty passing urine -signs of decreased  platelets or bleeding - bruising, pinpoint red spots on the skin, black, tarry stools, nosebleeds -signs of decreased red blood cells - unusually weak or tired, fainting spells, lightheadedness -breathing problems -chest pain -high or low blood pressure -mouth sores -nausea and vomiting -pain, swelling, redness or irritation at the injection site -pain, tingling, numbness in the hands or feet -slow or irregular heartbeat -swelling of the ankle, feet, hands Side effects that usually do not require medical attention (report to your doctor or health care professional if they continue or are bothersome): -bone pain -complete hair loss including hair on your head, underarms, pubic hair, eyebrows, and eyelashes -changes in the color of fingernails -diarrhea -loosening of the fingernails -loss of appetite -muscle or joint pain -red flush to skin -sweating This list may not describe all possible side effects. Call your doctor for medical advice about side effects. You may report side effects to FDA at 1-800-FDA-1088. Where should I keep my medicine? This drug is given in a hospital or clinic and will not be stored at home. NOTE: This sheet is a summary. It may not cover all possible information. If you have questions about this medicine, talk to your doctor, pharmacist, or health care provider.  2018 Elsevier/Gold Standard (2015-08-28 19:58:00)  Carboplatin injection What is this medicine? CARBOPLATIN (KAR boe pla tin) is a chemotherapy drug. It targets fast dividing cells, like cancer cells, and causes these cells to die. This medicine is used to treat ovarian cancer and many other cancers. This medicine may be used for other purposes; ask your health care provider or pharmacist if you have questions. COMMON BRAND NAME(S): Paraplatin What should  I tell my health care provider before I take this medicine? They need to know if you have any of these conditions: -blood disorders -hearing  problems -kidney disease -recent or ongoing radiation therapy -an unusual or allergic reaction to carboplatin, cisplatin, other chemotherapy, other medicines, foods, dyes, or preservatives -pregnant or trying to get pregnant -breast-feeding How should I use this medicine? This drug is usually given as an infusion into a vein. It is administered in a hospital or clinic by a specially trained health care professional. Talk to your pediatrician regarding the use of this medicine in children. Special care may be needed. Overdosage: If you think you have taken too much of this medicine contact a poison control center or emergency room at once. NOTE: This medicine is only for you. Do not share this medicine with others. What if I miss a dose? It is important not to miss a dose. Call your doctor or health care professional if you are unable to keep an appointment. What may interact with this medicine? -medicines for seizures -medicines to increase blood counts like filgrastim, pegfilgrastim, sargramostim -some antibiotics like amikacin, gentamicin, neomycin, streptomycin, tobramycin -vaccines Talk to your doctor or health care professional before taking any of these medicines: -acetaminophen -aspirin -ibuprofen -ketoprofen -naproxen This list may not describe all possible interactions. Give your health care provider a list of all the medicines, herbs, non-prescription drugs, or dietary supplements you use. Also tell them if you smoke, drink alcohol, or use illegal drugs. Some items may interact with your medicine. What should I watch for while using this medicine? Your condition will be monitored carefully while you are receiving this medicine. You will need important blood work done while you are taking this medicine. This drug may make you feel generally unwell. This is not uncommon, as chemotherapy can affect healthy cells as well as cancer cells. Report any side effects. Continue your course  of treatment even though you feel ill unless your doctor tells you to stop. In some cases, you may be given additional medicines to help with side effects. Follow all directions for their use. Call your doctor or health care professional for advice if you get a fever, chills or sore throat, or other symptoms of a cold or flu. Do not treat yourself. This drug decreases your body's ability to fight infections. Try to avoid being around people who are sick. This medicine may increase your risk to bruise or bleed. Call your doctor or health care professional if you notice any unusual bleeding. Be careful brushing and flossing your teeth or using a toothpick because you may get an infection or bleed more easily. If you have any dental work done, tell your dentist you are receiving this medicine. Avoid taking products that contain aspirin, acetaminophen, ibuprofen, naproxen, or ketoprofen unless instructed by your doctor. These medicines may hide a fever. Do not become pregnant while taking this medicine. Women should inform their doctor if they wish to become pregnant or think they might be pregnant. There is a potential for serious side effects to an unborn child. Talk to your health care professional or pharmacist for more information. Do not breast-feed an infant while taking this medicine. What side effects may I notice from receiving this medicine? Side effects that you should report to your doctor or health care professional as soon as possible: -allergic reactions like skin rash, itching or hives, swelling of the face, lips, or tongue -signs of infection - fever or chills, cough, sore throat,  pain or difficulty passing urine -signs of decreased platelets or bleeding - bruising, pinpoint red spots on the skin, black, tarry stools, nosebleeds -signs of decreased red blood cells - unusually weak or tired, fainting spells, lightheadedness -breathing problems -changes in hearing -changes in  vision -chest pain -high blood pressure -low blood counts - This drug may decrease the number of white blood cells, red blood cells and platelets. You may be at increased risk for infections and bleeding. -nausea and vomiting -pain, swelling, redness or irritation at the injection site -pain, tingling, numbness in the hands or feet -problems with balance, talking, walking -trouble passing urine or change in the amount of urine Side effects that usually do not require medical attention (report to your doctor or health care professional if they continue or are bothersome): -hair loss -loss of appetite -metallic taste in the mouth or changes in taste This list may not describe all possible side effects. Call your doctor for medical advice about side effects. You may report side effects to FDA at 1-800-FDA-1088. Where should I keep my medicine? This drug is given in a hospital or clinic and will not be stored at home. NOTE: This sheet is a summary. It may not cover all possible information. If you have questions about this medicine, talk to your doctor, pharmacist, or health care provider.  2018 Elsevier/Gold Standard (2008-02-01 14:38:05)    If you develop nausea and vomiting that is not controlled by your nausea medication, call the clinic.   BELOW ARE SYMPTOMS THAT SHOULD BE REPORTED IMMEDIATELY:  *FEVER GREATER THAN 100.5 F  *CHILLS WITH OR WITHOUT FEVER  NAUSEA AND VOMITING THAT IS NOT CONTROLLED WITH YOUR NAUSEA MEDICATION  *UNUSUAL SHORTNESS OF BREATH  *UNUSUAL BRUISING OR BLEEDING  TENDERNESS IN MOUTH AND THROAT WITH OR WITHOUT PRESENCE OF ULCERS  *URINARY PROBLEMS  *BOWEL PROBLEMS  UNUSUAL RASH Items with * indicate a potential emergency and should be followed up as soon as possible.  Feel free to call the clinic you have any questions or concerns. The clinic phone number is [phone].  Please show the [facility] at check-in to the Emergency Department  and triage nurse.

## 2017-06-30 NOTE — Progress Notes (Signed)
Spoke with pt for pre-op call. Pt was just here 3 weeks ago. He states medications, allergies, medical and surgical has not changed.

## 2017-06-30 NOTE — Progress Notes (Signed)
Oncology Nurse Navigator Documentation  Oncology Nurse Navigator Flowsheets 06/30/2017  Navigator Location [facility]-  Navigator Encounter Type Clinic/MDC/I spoke with Trevor Zavala today before his first chemo and rad onc appt.  He states he is doing well and I listened as he explained.  Patient would like port placed. I updated Dr. Julien Nordmann and he states ok.  I updated Dr. Everrett Coombe office to arrange.  I spoke to patient about smoking cessation. He states he has cut back and his goal is to quit. I encouraged his efforts and to continue to quit.   Treatment Initiated Date 06/25/2017  Patient Visit Type MedOnc  Treatment Phase First Chemo Tx;First Radiation Tx  Barriers/Navigation Needs Coordination of Care;Education  Education Smoking cessation  Interventions Coordination of Care;Education  Coordination of Care Other  Education Method Verbal  Acuity Level 2  Acuity Level 2 Educational needs;Other  Time Spent with Patient 30

## 2017-07-01 ENCOUNTER — Ambulatory Visit (HOSPITAL_COMMUNITY): Payer: Managed Care, Other (non HMO)

## 2017-07-01 ENCOUNTER — Ambulatory Visit (HOSPITAL_COMMUNITY): Payer: Managed Care, Other (non HMO) | Admitting: Certified Registered"

## 2017-07-01 ENCOUNTER — Ambulatory Visit (HOSPITAL_COMMUNITY)
Admission: RE | Admit: 2017-07-01 | Discharge: 2017-07-01 | Disposition: A | Discharge status: Home / Self Care | Payer: Managed Care, Other (non HMO) | Source: Ambulatory Visit | Attending: Cardiothoracic Surgery | Admitting: Cardiothoracic Surgery

## 2017-07-01 ENCOUNTER — Ambulatory Visit
Admission: RE | Admit: 2017-07-01 | Discharge: 2017-07-01 | Disposition: A | Discharge status: Home / Self Care | Payer: Managed Care, Other (non HMO) | Source: Ambulatory Visit | Attending: Radiation Oncology | Admitting: Radiation Oncology

## 2017-07-01 ENCOUNTER — Encounter (HOSPITAL_COMMUNITY): Payer: Self-pay | Admitting: Urology

## 2017-07-01 ENCOUNTER — Encounter (HOSPITAL_COMMUNITY)
Admission: RE | Disposition: A | Discharge status: Home / Self Care | Payer: Self-pay | Source: Ambulatory Visit | Attending: Cardiothoracic Surgery

## 2017-07-01 DIAGNOSIS — K219 Gastro-esophageal reflux disease without esophagitis: Secondary | ICD-10-CM | POA: Insufficient documentation

## 2017-07-01 DIAGNOSIS — C3492 Malignant neoplasm of unspecified part of left bronchus or lung: Secondary | ICD-10-CM | POA: Diagnosis present

## 2017-07-01 DIAGNOSIS — F17210 Nicotine dependence, cigarettes, uncomplicated: Secondary | ICD-10-CM | POA: Diagnosis not present

## 2017-07-01 DIAGNOSIS — Z510 Encounter for antineoplastic radiation therapy: Secondary | ICD-10-CM | POA: Diagnosis not present

## 2017-07-01 DIAGNOSIS — J449 Chronic obstructive pulmonary disease, unspecified: Secondary | ICD-10-CM | POA: Insufficient documentation

## 2017-07-01 DIAGNOSIS — I10 Essential (primary) hypertension: Secondary | ICD-10-CM | POA: Insufficient documentation

## 2017-07-01 DIAGNOSIS — Z452 Encounter for adjustment and management of vascular access device: Secondary | ICD-10-CM

## 2017-07-01 DIAGNOSIS — F419 Anxiety disorder, unspecified: Secondary | ICD-10-CM | POA: Diagnosis not present

## 2017-07-01 DIAGNOSIS — Z419 Encounter for procedure for purposes other than remedying health state, unspecified: Secondary | ICD-10-CM

## 2017-07-01 DIAGNOSIS — C3490 Malignant neoplasm of unspecified part of unspecified bronchus or lung: Secondary | ICD-10-CM

## 2017-07-01 HISTORY — PX: PORTACATH PLACEMENT: SHX2246

## 2017-07-01 LAB — PROTIME-INR
INR: 1.03
Prothrombin Time: 13.5 seconds (ref 11.4–15.2)

## 2017-07-01 LAB — APTT: aPTT: 27 seconds (ref 24–36)

## 2017-07-01 SURGERY — INSERTION, TUNNELED CENTRAL VENOUS DEVICE, WITH PORT
Anesthesia: Monitor Anesthesia Care | Site: Chest | Laterality: Right

## 2017-07-01 MED ORDER — MIDAZOLAM HCL 5 MG/5ML IJ SOLN
INTRAMUSCULAR | Status: DC | PRN
  Administered 2017-07-01: 2 mg via INTRAVENOUS

## 2017-07-01 MED ORDER — PROPOFOL 10 MG/ML IV BOLUS
INTRAVENOUS | Status: AC
Start: 2017-07-01 — End: 2017-07-01
  Filled 2017-07-01: qty 20

## 2017-07-01 MED ORDER — HEPARIN SOD (PORK) LOCK FLUSH 100 UNIT/ML IV SOLN
INTRAVENOUS | Status: DC | PRN
  Administered 2017-07-01: 500 [IU] via INTRAVENOUS

## 2017-07-01 MED ORDER — LACTATED RINGERS IV SOLN
INTRAVENOUS | Status: DC
  Administered 2017-07-01: 14:00:00 via INTRAVENOUS

## 2017-07-01 MED ORDER — LIDOCAINE 2% (20 MG/ML) 5 ML SYRINGE
INTRAMUSCULAR | Status: AC
  Filled 2017-07-01: qty 5

## 2017-07-01 MED ORDER — LIDOCAINE HCL (PF) 1 % IJ SOLN
INTRAMUSCULAR | Status: DC | PRN
  Administered 2017-07-01: 30 mL

## 2017-07-01 MED ORDER — HEPARIN SOD (PORK) LOCK FLUSH 100 UNIT/ML IV SOLN
INTRAVENOUS | Status: AC
  Filled 2017-07-01: qty 5

## 2017-07-01 MED ORDER — LIDOCAINE HCL (PF) 1 % IJ SOLN
INTRAMUSCULAR | Status: AC
  Filled 2017-07-01: qty 30

## 2017-07-01 MED ORDER — FENTANYL CITRATE (PF) 250 MCG/5ML IJ SOLN
INTRAMUSCULAR | Status: AC
  Filled 2017-07-01: qty 5

## 2017-07-01 MED ORDER — PHENYLEPHRINE 40 MCG/ML (10ML) SYRINGE FOR IV PUSH (FOR BLOOD PRESSURE SUPPORT)
PREFILLED_SYRINGE | INTRAVENOUS | Status: AC
  Filled 2017-07-01: qty 10

## 2017-07-01 MED ORDER — MIDAZOLAM HCL 2 MG/2ML IJ SOLN
INTRAMUSCULAR | Status: AC
  Filled 2017-07-01: qty 2

## 2017-07-01 MED ORDER — FENTANYL CITRATE (PF) 100 MCG/2ML IJ SOLN
INTRAMUSCULAR | Status: DC | PRN
  Administered 2017-07-01 (×4): 50 ug via INTRAVENOUS

## 2017-07-01 MED ORDER — SODIUM CHLORIDE 0.9 % IR SOLN
Status: DC | PRN
  Administered 2017-07-01: 1000 mL

## 2017-07-01 MED ORDER — LIDOCAINE 2% (20 MG/ML) 5 ML SYRINGE
INTRAMUSCULAR | Status: DC | PRN
  Administered 2017-07-01: 100 mg via INTRAVENOUS

## 2017-07-01 MED ORDER — HYDROCODONE-ACETAMINOPHEN 5-325 MG PO TABS: 1.0000 | ORAL_TABLET | Freq: Four times a day (QID) | ORAL | 0 refills | Status: DC | PRN

## 2017-07-01 MED ORDER — SODIUM CHLORIDE 0.9 % IV SOLN
INTRAVENOUS | Status: DC | PRN
  Administered 2017-07-01: 500 mL

## 2017-07-01 MED ORDER — ONDANSETRON HCL 4 MG/2ML IJ SOLN
INTRAMUSCULAR | Status: AC
  Filled 2017-07-01: qty 2

## 2017-07-01 MED ORDER — ONDANSETRON HCL 4 MG/2ML IJ SOLN
INTRAMUSCULAR | Status: DC | PRN
  Administered 2017-07-01: 4 mg via INTRAVENOUS

## 2017-07-01 MED ORDER — DEXTROSE 5 % IV SOLN
1.5000 g | INTRAVENOUS | Status: AC
  Administered 2017-07-01: 1.5 g via INTRAVENOUS
  Filled 2017-07-01: qty 1.5

## 2017-07-01 MED ORDER — PROPOFOL 500 MG/50ML IV EMUL
INTRAVENOUS | Status: DC | PRN
  Administered 2017-07-01: 50 ug/kg/min via INTRAVENOUS
  Administered 2017-07-01: 75 ug/kg/min via INTRAVENOUS

## 2017-07-01 SURGICAL SUPPLY — 47 items
ADH SKN CLS APL DERMABOND .7 (GAUZE/BANDAGES/DRESSINGS) ×1
ADH SKN CLS LQ APL DERMABOND (GAUZE/BANDAGES/DRESSINGS) ×1
BAG DECANTER FOR FLEXI CONT (MISCELLANEOUS) ×2 IMPLANT
BLADE SURG 11 STRL SS (BLADE) ×2 IMPLANT
CANISTER SUCT 3000ML PPV (MISCELLANEOUS) ×2 IMPLANT
COVER PROBE W GEL 5X96 (DRAPES) ×2 IMPLANT
COVER SURGICAL LIGHT HANDLE (MISCELLANEOUS) ×2 IMPLANT
DERMABOND ADHESIVE PROPEN (GAUZE/BANDAGES/DRESSINGS) ×1
DERMABOND ADVANCED (GAUZE/BANDAGES/DRESSINGS) ×1
DERMABOND ADVANCED .7 DNX12 (GAUZE/BANDAGES/DRESSINGS) ×1 IMPLANT
DERMABOND ADVANCED .7 DNX6 (GAUZE/BANDAGES/DRESSINGS) IMPLANT
DRAPE C-ARM 42X72 X-RAY (DRAPES) ×2 IMPLANT
DRAPE CHEST BREAST 15X10 FENES (DRAPES) ×2 IMPLANT
ELECT CAUTERY BLADE 6.4 (BLADE) ×2 IMPLANT
ELECT REM PT RETURN 9FT ADLT (ELECTROSURGICAL) ×2
ELECTRODE REM PT RTRN 9FT ADLT (ELECTROSURGICAL) ×1 IMPLANT
GAUZE SPONGE 4X4 12PLY STRL (GAUZE/BANDAGES/DRESSINGS) ×2 IMPLANT
GLOVE BIO SURGEON STRL SZ 6.5 (GLOVE) ×6 IMPLANT
GLOVE BIOGEL PI IND STRL 7.0 (GLOVE) IMPLANT
GLOVE BIOGEL PI INDICATOR 7.0 (GLOVE) ×1
GOWN STRL REUS W/ TWL LRG LVL3 (GOWN DISPOSABLE) ×2 IMPLANT
GOWN STRL REUS W/TWL LRG LVL3 (GOWN DISPOSABLE) ×4
GUIDEWIRE UNCOATED ST S 7038 (WIRE) IMPLANT
INTRODUCER 13FR (MISCELLANEOUS) IMPLANT
INTRODUCER COOK 11FR (CATHETERS) IMPLANT
KIT BASIN OR (CUSTOM PROCEDURE TRAY) ×2 IMPLANT
KIT PORT POWER 9.6FR MRI PREA (Stent) ×1 IMPLANT
KIT ROOM TURNOVER OR (KITS) ×2 IMPLANT
NDL HYPO 25GX1X1/2 BEV (NEEDLE) ×1 IMPLANT
NEEDLE 22X1 1/2 (OR ONLY) (NEEDLE) ×1 IMPLANT
NEEDLE HYPO 25GX1X1/2 BEV (NEEDLE) ×2 IMPLANT
NS IRRIG 1000ML POUR BTL (IV SOLUTION) ×2 IMPLANT
PACK GENERAL/GYN (CUSTOM PROCEDURE TRAY) ×2 IMPLANT
PAD ARMBOARD 7.5X6 YLW CONV (MISCELLANEOUS) ×4 IMPLANT
SET SHEATH INTRODUCER 10FR (MISCELLANEOUS) IMPLANT
SUT SILK 2 0 SH (SUTURE) ×2 IMPLANT
SUT VIC AB 3-0 SH 18 (SUTURE) ×2 IMPLANT
SUT VIC AB 4-0 PS2 27 (SUTURE) ×1 IMPLANT
SUT VICRYL 4-0 PS2 18IN ABS (SUTURE) ×2 IMPLANT
SYR 20CC LL (SYRINGE) ×2 IMPLANT
SYR 30ML LL (SYRINGE) ×1 IMPLANT
SYR 5ML LL (SYRINGE) ×1 IMPLANT
SYR 5ML LUER SLIP (SYRINGE) IMPLANT
SYR CONTROL 10ML LL (SYRINGE) ×3 IMPLANT
TOWEL OR 17X24 6PK STRL BLUE (TOWEL DISPOSABLE) ×2 IMPLANT
TOWEL OR 17X26 10 PK STRL BLUE (TOWEL DISPOSABLE) ×2 IMPLANT
WATER STERILE IRR 1000ML POUR (IV SOLUTION) ×2 IMPLANT

## 2017-07-01 NOTE — Brief Op Note (Signed)
      [address]       [place] [address]             [phone]      07/01/2017  3:04 PM  PATIENT:  Trevor Zavala  51 y.o. male  PRE-OPERATIVE DIAGNOSIS:  LUNG CANCER  POST-OPERATIVE DIAGNOSIS:  LUNG CANCER  PROCEDURE:  Procedure(s): INSERTION PORT-A-CATH - RIGHT IJ - placed with Fluoro and Ultrasound (Right)  SURGEON:  Surgeon(s) and Role:    * Grace Isaac, MD - Primary  PHYSICIAN ASSISTANT:   ASSISTANTS: none   ANESTHESIA:   MAC  EBL:  Total I/O In: 600 [I.V.:600] Out: 5 [Blood:5]  BLOOD ADMINISTERED:none  DRAINS: none   LOCAL MEDICATIONS USED:  LIDOCAINE  and Amount: 18 ml  SPECIMEN:  No Specimen  DISPOSITION OF SPECIMEN:  N/A  COUNTS:  YES   DICTATION: .Dragon Dictation  PLAN OF CARE: Discharge to home after PACU  PATIENT DISPOSITION:  PACU - hemodynamically stable.   Delay start of Pharmacological VTE agent (>24hrs) due to surgical blood loss or risk of bleeding: yes

## 2017-07-01 NOTE — Transfer of Care (Signed)
Immediate Anesthesia Transfer of Care Note  Patient: Trevor Zavala  Procedure(s) Performed: Procedure(s): INSERTION PORT-A-CATH - RIGHT IJ - placed with Fluoro and Ultrasound (Right)  Patient Location: PACU  Anesthesia Type:MAC  Level of Consciousness: awake, alert , oriented and patient cooperative  Airway & Oxygen Therapy: Patient Spontanous Breathing  Post-op Assessment: Report given to RN, Post -op Vital signs reviewed and stable and Patient moving all extremities  Post vital signs: Reviewed and stable  Last Vitals:  Vitals:   07/01/17 1143 07/01/17 1504  BP: 124/75   Pulse: 90   Resp: 18   Temp: 36.6 C 36.5 C  SpO2: 99%     Last Pain:  Vitals:   07/01/17 1143  TempSrc: Oral      Patients Stated Pain Goal: 2 (82/64/15 8309)  Complications: No apparent anesthesia complications

## 2017-07-01 NOTE — Anesthesia Preprocedure Evaluation (Addendum)
Anesthesia Evaluation  Patient identified by MRN, date of birth, ID band Patient awake    Reviewed: Allergy & Precautions, NPO status , Patient's Chart, lab work & pertinent test results  Airway Mallampati: II  TM Distance: >3 FB     Dental   Pulmonary pneumonia, COPD, Current Smoker,    breath sounds clear to auscultation       Cardiovascular hypertension,  Rhythm:Regular Rate:Normal     Neuro/Psych  Headaches,    GI/Hepatic Neg liver ROS, GERD  ,  Endo/Other  negative endocrine ROS  Renal/GU negative Renal ROS     Musculoskeletal   Abdominal   Peds  Hematology   Anesthesia Other Findings   Reproductive/Obstetrics                             Anesthesia Physical Anesthesia Plan  ASA: III  Anesthesia Plan: MAC   Post-op Pain Management:    Induction: Intravenous  PONV Risk Score and Plan: 0 and Propofol infusion and Treatment may vary due to age or medical condition  Airway Management Planned:   Additional Equipment:   Intra-op Plan:   Post-operative Plan:   Informed Consent: I have reviewed the patients History and Physical, chart, labs and discussed the procedure including the risks, benefits and alternatives for the proposed anesthesia with the patient or authorized representative who has indicated his/her understanding and acceptance.   Dental advisory given  Plan Discussed with: CRNA and Anesthesiologist  Anesthesia Plan Comments:         Anesthesia Quick Evaluation

## 2017-07-01 NOTE — Anesthesia Postprocedure Evaluation (Signed)
Anesthesia Post Note  Patient: Trevor Zavala  Procedure(s) Performed: Procedure(s) (LRB): INSERTION PORT-A-CATH - RIGHT IJ - placed with Fluoro and Ultrasound (Right)     Patient location during evaluation: PACU Anesthesia Type: MAC Level of consciousness: awake Pain management: pain level controlled Vital Signs Assessment: post-procedure vital signs reviewed and stable Respiratory status: spontaneous breathing Cardiovascular status: stable Anesthetic complications: no    Last Vitals:  Vitals:   07/01/17 1530 07/01/17 1545  BP: 134/79   Pulse: 71 69  Resp: 20   Temp: 36.7 C   SpO2: 96% 98%    Last Pain:  Vitals:   07/01/17 1545  TempSrc:   PainSc: 0-No pain                 Hasaan Radde

## 2017-07-01 NOTE — Discharge Instructions (Addendum)
Implanted Port Insertion, Care After °This sheet gives you information about how to care for yourself after your procedure. Your health care provider may also give you more specific instructions. If you have problems or questions, contact your health care provider. °What can I expect after the procedure? °After your procedure, it is common to have: °· Discomfort at the port insertion site. °· Bruising on the skin over the port. This should improve over 3-4 days. ° °Follow these instructions at home: °Port care °· After your port is placed, you will get a manufacturer's information card. The card has information about your port. Keep this card with you at all times. °· Take care of the port as told by your health care provider. Ask your health care provider if you or a family member can get training for taking care of the port at home. A home health care nurse may also take care of the port. °· Make sure to remember what type of port you have. °Incision care °· Follow instructions from your health care provider about how to take care of your port insertion site. Make sure you: °? Wash your hands with soap and water before you change your bandage (dressing). If soap and water are not available, use hand sanitizer. °? Change your dressing as told by your health care provider. °? Leave stitches (sutures), skin glue, or adhesive strips in place. These skin closures may need to stay in place for 2 weeks or longer. If adhesive strip edges start to loosen and curl up, you may trim the loose edges. Do not remove adhesive strips completely unless your health care provider tells you to do that. °· Check your port insertion site every day for signs of infection. Check for: °? More redness, swelling, or pain. °? More fluid or blood. °? Warmth. °? Pus or a bad smell. °General instructions °· Do not take baths, swim, or use a hot tub until your health care provider approves. °· Do not lift anything that is heavier than 10 lb (4.5  kg) for a week, or as told by your health care provider. °· Ask your health care provider when it is okay to: °? Return to work or school. °? Resume usual physical activities or sports. °· Do not drive for 24 hours if you were given a medicine to help you relax (sedative). °· Take over-the-counter and prescription medicines only as told by your health care provider. °· Wear a medical alert bracelet in case of an emergency. This will tell any health care providers that you have a port. °· Keep all follow-up visits as told by your health care provider. This is important. °Contact a health care provider if: °· You cannot flush your port with saline as directed, or you cannot draw blood from the port. °· You have a fever or chills. °· You have more redness, swelling, or pain around your port insertion site. °· You have more fluid or blood coming from your port insertion site. °· Your port insertion site feels warm to the touch. °· You have pus or a bad smell coming from the port insertion site. °Get help right away if: °· You have chest pain or shortness of breath. °· You have bleeding from your port that you cannot control. °Summary °· Take care of the port as told by your health care provider. °· Change your dressing as told by your health care provider. °· Keep all follow-up visits as told by your health care provider. °  This information is not intended to replace advice given to you by your health care provider. Make sure you discuss any questions you have with your health care provider. °Document Released: 08/17/2013 Document Revised: 09/17/2016 Document Reviewed: 09/17/2016 °Elsevier Interactive Patient Education © 2017 [employer] ° °

## 2017-07-01 NOTE — H&P (Signed)
[address]       [name] [address]             [id number]                    Trevor Zavala [facility] Medical Record #[id number] Date of Birth: [birth year]  Referring: Cassandria Anger, MD Primary Care: Cassandria Anger, MD  Chief Complaint:        Chief Complaint  Patient presents with  . Lung Lesion    Surgical eval, Chest CTA 06/06/17, PET Sacn 8    History of Present Illness:    Trevor Zavala 51 y.o. male is seen in the office   for Left lung mass. The patient has been a long-term smoker for more than 30 years. About 5 weeks ago he had an episode of hemoptysis 1. He denies any fever or chills but has had some night sweats. He notes a 37 pound weight loss over the past 12 months. Last week he had a CT scan of the chest, which was abnormal. The following day he had episode of left upper chest pain and went to the emergency room and a second CT of the chest was performed.  Over the past several days he's had increasing hoarseness noted by himself and his family. He has had biopsy of lung bilateral and vocal cord  With Stage IV non small cell Ca. Requested by oncology to place porta cath for chemo    Current Activity/ Functional Status:  Patient is independent with mobility/ambulation, transfers, ADL's, IADL's.   Zubrod Score: At the time of surgery this patient's most appropriate activity status/level should be described as: [x]     0    Normal activity, no symptoms []     1    Restricted in physical strenuous activity but ambulatory, able to do out light work []     2    Ambulatory and capable of self care, unable to do work activities, up and about               >50 % of waking hours                              []     3    Only limited self care, in bed greater than 50% of waking hours []     4    Completely disabled, no self care, confined to bed or chair []     5    Moribund   Past Medical History:  Diagnosis Date  . Anxiety   . COPD  (chronic obstructive pulmonary disease) ([facility])   . GERD (gastroesophageal reflux disease)   . Headache   . Hypertension   . Pneumonia   . Situational depression   . Stage III squamous cell carcinoma of left lung ([facility]) 06/19/2017    Past Surgical History:  Procedure Laterality Date  . COLONOSCOPY    . DIRECT LARYNGOSCOPY N/A 06/25/2017   Procedure: DIRECT LARYNGOSCOPY AND BIOPSY;  Surgeon: Rozetta Nunnery, MD;  Location: [facility];  Service: ENT;  Laterality: N/A;  . EAR CYST EXCISION N/A 11/17/2013   Procedure: SEBACEOUS CYST CHEST;  Surgeon: Joyice Faster. Cornett, MD;  Location: [facility];  Service: General;  Laterality: N/A;  . KNEE ARTHROSCOPY     LEFT  . LIPOMA EXCISION N/A 11/17/2013   Procedure: EXCISION LIPOMA  FOREHEAD;  Surgeon: Joyice Faster. Cornett, MD;  Location: [facility];  Service: General;  Laterality: N/A;  . LUNG BIOPSY Bilateral 06/12/2017   Procedure: LEFT LUNG BIOPSY;  Surgeon: Grace Isaac, MD;  Location: [facility];  Service: Thoracic;  Laterality: Bilateral;  . VIDEO BRONCHOSCOPY WITH ENDOBRONCHIAL ULTRASOUND N/A 06/12/2017   Procedure: VIDEO BRONCHOSCOPY WITH ENDOBRONCHIAL ULTRASOUND;  Surgeon: Grace Isaac, MD;  Location: [facility] OR;  Service: Thoracic;  Laterality: N/A;    Family History  Problem Relation Age of Onset  . Breast cancer Mother   . Hypertension Mother   . Colon cancer Mother   . Skin cancer Father   . Hypertension Father   . Colon polyps Father   . Cancer Father 39       b cell lymphoma  . Colon cancer Father     Social History   Social History  . Marital status: Married    Spouse name: N/A  . Number of children: N/A  . Years of education: N/A   Occupational History  . 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 Smoking status: Former Smoker    Packs/day: 1.00  . Smokeless tobacco: Never Used  . Alcohol use 0.0 oz/week     Comment:  SOCIAL  . Drug use: No  . Sexual activity: Yes   Other Topics Concern  . Not on file   Social History Narrative   Daily Caffeine Use:  3    History  Smoking Status  . Current Every Day Smoker  . Packs/day: 0.25  . Years: 35.00  Smokeless Tobacco  . Never Used    Comment: down to 2 cig/day 06/30/17    History  Alcohol Use  . 0.0 oz/week    Comment: SOCIAL     No Known Allergies  Current Facility-Administered Medications  Medication Dose Route Frequency Provider Last Rate Last Dose  . cefUROXime (ZINACEF) 1.5 g in dextrose 5 % 50 mL IVPB  1.5 g Intravenous 60 min Pre-Op Grace Isaac, MD      . lactated ringers infusion   Intravenous Continuous Duane Boston, MD        Pertinent items are noted in HPI.   Review of Systems:     Cardiac Review of Systems: Y or N  Chest Pain [ y   ]  Resting SOB [n   ] Exertional SOB  [ y ]  Orthopnea [ n ]   Pedal Edema [n   ]    Palpitations [ n ] Syncope  [  n]   Presyncope [ n  ]  General Review of Systems: [Y] = yes [  ]=no Constitional: recent weight change y[  ];  Wt loss over the last 3 months [   ] anorexia [  ]; fatigue [  ]; nausea [  ]; night sweats [  ]; fever [  ]; or chills [  ];          Dental: poor dentition[ n ]; Last Dentist visit:   Eye : blurred vision [  ]; diplopia [   ]; vision changes [  ];  Amaurosis fugax[  ]; Resp: cough Blue.Reese  ];  wheezing[y  ];  hemoptysis[y  ]; shortness of breath[ n ]; paroxysmal nocturnal dyspnea[n  ]; dyspnea on exertion[ n ]; or orthopnea[  ];  GI:  gallstones[  ], vomiting[  ];  dysphagia[  ];  melena[  ];  hematochezia [  ]; heartburn[  ];   Hx of  Colonoscopy[  ]; GU: kidney stones [  ]; hematuria[  ];   dysuria [  ];  nocturia[  ];  history of     obstruction [  ]; urinary frequency [  ]             Skin: rash, swelling[  ];, hair loss[  ];  peripheral edema[  ];  or itching[  ]; Musculosketetal: myalgias[  ];  joint swelling[  ];  joint erythema[n  ];  joint pain[  ];  back pain[ n  ];  Heme/Lymph: bruising[  ];  bleeding[  ];  anemia[  ];  Neuro: TIA[n  ];  headaches[n  ];  stroke[n  ];  vertigo[n  ];  seizures[ n ];   paresthesias[  ];  difficulty walking[n  ];  Psych:depression[  ]; anxiety[  ];  Endocrine: diabetes[  ];  thyroid dysfunction[  ];  Immunizations: Flu up to date Florencio.Farrier  ]; Pneumococcal up to date Florencio.Farrier  ];  Other:  Physical Exam: BP 124/75   Pulse 90   Temp 97.8 F (36.6 C) (Oral)   Resp 18   Ht 5\' 10"  (1.778 m)   Wt 167 lb (75.8 kg)   SpO2 99%   BMI 23.96 kg/m   PHYSICAL EXAMINATION: General appearance: alert, cooperative, appears older than stated age and no distress Head: Normocephalic, without obvious abnormality, atraumatic Neck: no adenopathy, no carotid bruit, no JVD, supple, symmetrical, trachea midline and thyroid not enlarged, symmetric, no tenderness/mass/nodules Lymph nodes: Cervical, supraclavicular, and axillary nodes normal. Resp: clear to auscultation bilaterally Back: symmetric, no curvature. ROM normal. No CVA tenderness. Cardio: regular rate and rhythm, S1, S2 normal, no murmur, click, rub or gallop GI: soft, non-tender; bowel sounds normal; no masses,  no organomegaly Extremities: extremities normal, atraumatic, no cyanosis or edema and Homans sign is negative, no sign of DVT Neurologic: Grossly normal  Diagnostic Studies & Laboratory data:     Recent Radiology Findings: Dg Chest 2 View  Result Date: 07/01/2017 CLINICAL DATA:  Preop evaluation for upcoming port insertion, initial encounter EXAM: CHEST  2 VIEW COMPARISON:  06/12/2017 FINDINGS: Cardiac shadow is stable. The lungs are well aerated bilaterally. Stable chronic changes are noted in the lingula. The known left upper lobe nodule is not well appreciated. Some fullness in the left hilar region is noted consistent with the given clinical history of lung carcinoma. No acute bony abnormality is seen. IMPRESSION: No change from previous exams. Electronically Signed   By:  Inez Catalina M.D.   On: 07/01/2017 12:23   Mr Jeri Cos SW Contrast  Result Date: 06/11/2017 CLINICAL DATA:  51 y/o M; possible lung cancer, biopsy to confirm 06/12/2017, evaluate for intracranial metastasis. EXAM: MRI HEAD WITHOUT AND WITH CONTRAST TECHNIQUE: Multiplanar, multiecho pulse sequences of the brain and surrounding structures were obtained without and with intravenous contrast. CONTRAST:  32mL MULTIHANCE GADOBENATE DIMEGLUMINE 529 MG/ML IV SOLN COMPARISON:  None. FINDINGS: Brain: No acute infarction, hemorrhage, hydrocephalus, extra-axial collection or mass lesion. Few scattered nonspecific punctate foci of T2 FLAIR hyperintense signal abnormality in subcortical white matter is consistent with mild chronic microvascular ischemic changes. Mild brain parenchymal volume loss. No abnormal enhancement of the brain. Vascular: Normal flow voids. Skull and upper cervical spine: Normal marrow signal. No abnormal enhancement. Sinuses/Orbits: Mild paranasal sinus mucosal thickening and small left maxillary sinus mucous retention cyst. No abnormal signal of  mastoid air cells. Normal orbits. Other: None. IMPRESSION: 1. No intracranial metastatic disease identified. 2. Mild chronic microvascular ischemic changes and mild parenchymal volume loss of the brain. Electronically Signed   By: Kristine Garbe M.D.   On: 06/11/2017 22:10     Dg Chest 2 View  Result Date: 06/06/2017 CLINICAL DATA:  Shortness of breath, cough, left hilar mass EXAM: CHEST  2 VIEW COMPARISON:  05/27/2017, 06/05/2017 FINDINGS: Mild hyperinflation compatible with background emphysema. Left hilar prominence correlates with the CT from yesterday compatible with the ill-defined infiltrative left hilar/ mediastinal mass. Streaky lingula bandlike opacity, compatible with atelectasis. Right lung remains clear. No significant interval change. No new focal acute airspace process, significant lung collapse, edema, effusion or pneumothorax.  Trachea is midline. Monitor leads overlie the chest. No acute osseous finding. No soft tissue abnormality appreciated IMPRESSION: Mild hilar prominence correlates with the left hilar/ mediastinal infiltrative mass by CT from yesterday. Minor lingula partial collapse/ atelectasis Mild hyperinflation No significant interval change or acute process by plain radiography Electronically Signed   By: Jerilynn Mages.  Shick M.D.   On: 06/06/2017 10:57   Dg Chest 2 View  Result Date: 05/27/2017 CLINICAL DATA:  Cough and congestion for 1 week EXAM: CHEST  2 VIEW COMPARISON:  11/23/2014 FINDINGS: Cardiac shadow is stable. Lungs are hyperinflated bilaterally. No focal infiltrate or sizable effusion is seen. Persistent lingular scarring is noted. No bony abnormality is seen. IMPRESSION: Mild COPD without acute abnormality. Some lingular scarring remains. Electronically Signed   By: Inez Catalina M.D.   On: 05/27/2017 16:56   Ct Chest W Contrast  Result Date: 06/05/2017 CLINICAL DATA:  Hemoptysis 2 weeks ago. Weight loss. Smoker with history of COPD. EXAM: CT CHEST WITH CONTRAST TECHNIQUE: Multidetector CT imaging of the chest was performed during intravenous contrast administration. CONTRAST:  87mL ISOVUE-300 IOPAMIDOL (ISOVUE-300) INJECTION 61% COMPARISON:  Chest radiographs 05/27/2017. Report only from chest CT 04/26/2002 FINDINGS: Cardiovascular: Mild atherosclerosis of the aorta, great vessels and coronary arteries. No evidence of acute pulmonary embolism. There is left pulmonary arterial encasement by tumor, further described below. The heart size is normal. There is no pericardial effusion. Mediastinum/Nodes: There is an irregular left hilar mass, encasing the pulmonary vessels and left upper lobe bronchus. This mass measures up to 4.9 x 4.1 cm transverse on image 76. There is contiguous left hilar and AP window lymphadenopathy. An AP window node measures up to 2.5 x 3.5 cm on image 64. There is an enlarged left supraclavicular  node, measuring 15 mm on coronal image 60. There also prominent right hilar (9 mm, image 76) and subcarinal (9 mm, image 78) nodes. The thyroid gland, trachea and esophagus demonstrate no significant findings. Lungs/Pleura: Trace dependent pleural fluid versus thickening on the left. Mild centrilobular emphysema. As above, there is a large left hilar mass causing narrowing of the left upper lobe bronchus. There is central airway thickening and patchy pneumonitis throughout the left upper lobe, but no lobar collapse. There is a 7 mm left upper lobe pulmonary nodule on image 31. There is another smaller left upper lobe nodule on image 35. No dominant peripheral left lung mass identified. There is an irregular somewhat spiculated right upper lobe nodule measuring 10 x 7 mm on image 49. Upper abdomen: Small nonspecific nodule involving the lateral limb of the left adrenal gland, measuring 13 x 11 mm on image 166. The right adrenal gland and visualized liver appear unremarkable. Musculoskeletal/Chest wall: There is no chest wall mass or suspicious  osseous finding. IMPRESSION: 1. Left hilar mass highly worrisome for bronchogenic carcinoma. There is narrowing of the left upper lobe bronchus and pulmonary arteries with postobstructive left upper lobe pneumonitis. 2. Enlarged mediastinal, hilar and left supraclavicular nodes worrisome for metastatic disease. 3. No dominant peripheral left lung mass identified. There are small left upper lobe nodules and a more worrisome right upper lobe nodule. The latter could reflect a metastasis or synchronous primary. 4. Indeterminate small left adrenal nodule. 5. Tissue sampling recommended. PET-CT may be helpful for further evaluation. 6. These results will be called to the ordering clinician or representative by the Radiologist Assistant, and communication documented in the PACS or zVision Dashboard. Electronically Signed   By: Richardean Sale M.D.   On: 06/05/2017 14:19   Ct Angio  Chest Pe W And/or Wo Contrast  Result Date: 06/06/2017 CLINICAL DATA:  Shortness of breath, cough EXAM: CT ANGIOGRAPHY CHEST WITH CONTRAST TECHNIQUE: Multidetector CT imaging of the chest was performed using the standard protocol during bolus administration of intravenous contrast. Multiplanar CT image reconstructions and MIPs were obtained to evaluate the vascular anatomy. CONTRAST:  80 cc Isovue 370 IV COMPARISON:  Chest x-ray earlier today.  Chest CT 06/05/2017 FINDINGS: Cardiovascular: No filling defects in the pulmonary artery to suggest pulmonary emboli. Atherosclerotic calcifications scattered in the aorta. No evidence of aneurysm. Left pulmonary arterial encasement by tumor as described below. Mediastinum/Nodes: Again noted is the large left hilar mass encasing the pulmonary vessels and left upper lobe bronchus, unchanged since study 2 days ago. AP window adenopathy is stable. No right hilar or axillary adenopathy. Left supraclavicular adenopathy again noted, unchanged. Lungs/Pleura: Airway thickening bilaterally, likely bronchitis. No effusions. Lingular atelectasis. Patchy pneumonitis in the left upper lobe, stable. Previously seen 7 mm nodule in the left upper lobe is not as well visualized but likely present on image 27. Right upper lobe nodule anteriorly on image 47 measures 10 mm, stable. No effusions. Upper Abdomen: Imaging into the upper abdomen shows no acute findings. Musculoskeletal: Chest wall soft tissues are unremarkable. No acute bony abnormality. Review of the MIP images confirms the above findings. IMPRESSION: No evidence of pulmonary embolus. Stable encasement of the left pulmonary vessels and upper lobe bronchus by a irregular left hilar mass. Associated AP window/mediastinal adenopathy and left supraclavicular adenopathy. Findings are stable since study 2 days ago. Findings highly suspicious for bronchogenic carcinoma. Stable bilateral pulmonary nodules. Stable left upper lobe  pneumonitis and diffuse peribronchial thickening. Electronically Signed   By: Rolm Baptise M.D.   On: 06/06/2017 13:49   Nm Pet Image Initial (pi) Skull Base To Thigh  Result Date: 06/10/2017 CLINICAL DATA:  Initial treatment strategy for left upper lobe mass on CT, presumed bronchogenic carcinoma. EXAM: NUCLEAR MEDICINE PET SKULL BASE TO THIGH TECHNIQUE: 12.51 mCi F-18 FDG was injected intravenously. Full-ring PET imaging was performed from the skull base to thigh after the radiotracer. CT data was obtained and used for attenuation correction and anatomic localization. FASTING BLOOD GLUCOSE:  Value: 84 Mg/dl COMPARISON:  Chest CT 06/05/2017 and 06/06/2017. FINDINGS: NECK Hypermetabolic left supraclavicular node measures 16 mm on image 74 and has an SUV max of 13.4. No other hypermetabolic cervical lymph nodes. There is midline hypermetabolic activity involving the base of the epiglottis. This has an SUV max of 5.6. No focal lesion is apparent in this area on the CT images.No other lesions of the pharyngeal mucosal space are seen. CHEST The large left hilar mass is markedly hypermetabolic. This has an  SUV max of 22.2 and measures approximately 3.8 x 6.2 cm on image 107. This encases the left upper lobe bronchus and abuts the left mainstem bronchus to the level of the carina. No endobronchial component identified. In addition to the left supraclavicular node, there are small hypermetabolic left paratracheal (SUV max 5.7) and subcarinal (SUV max 3.4) lymph nodes. There is a small hypermetabolic inferior right hilar node (SUV max 2.6). There are no peripheral hypermetabolic nodules within the left lung. There is stable mild pneumonitis within the left upper lobe. The small spiculated right upper lobe nodule is hypermetabolic. This nodule measures 12 x 8 mm on image 97 and has an SUV max of 3.5. ABDOMEN/PELVIS There is no hypermetabolic activity within the liver, adrenal glands, spleen or pancreas. Specifically,  there is no abnormal metabolic activity within the tiny left adrenal nodule. There are no hypermetabolic lymph nodes in the abdomen or pelvis. CT images demonstrate mild aortoiliac atherosclerosis. SKELETON There is no hypermetabolic activity to suggest osseous metastatic disease. IMPRESSION: 1. 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 The left hilar mass demonstrates central extension in the AP window, abutting the left mainstem bronchus to the level of the carina. 2. Contralateral 12 mm hypermetabolic right upper lobe nodule is not morphologically typical for a metastasis and may reflect a synchronous primary cancer. Mildly hypermetabolic inferior right hilar node, nonspecific. 3. No evidence of metastatic disease within the abdomen or pelvis. 4. Indeterminate hypermetabolic activity at the base of the epiglottis. This could reflect a small mucosal neoplasm or focal inflammation. Direct visualization recommended. Electronically Signed   By: Richardean Sale M.D.   On: 06/10/2017 16:27     I have independently reviewed the above radiologic studies.  Recent Lab Findings: Lab Results  Component Value Date   WBC 11.9 (H) 06/30/2017   HGB 15.1 06/30/2017   HCT 44.8 06/30/2017   PLT 285 06/30/2017   GLUCOSE 92 06/30/2017   CHOL 174 11/23/2014   TRIG 93.0 11/23/2014   HDL 52.50 11/23/2014   LDLDIRECT 91.6 08/19/2010   LDLCALC 103 (H) 11/23/2014   ALT 21 06/30/2017   AST 17 06/30/2017   NA 142 06/30/2017   K 3.8 06/30/2017   CL 102 06/06/2017   CREATININE 0.7 06/30/2017   BUN 12.6 06/30/2017   CO2 29 06/30/2017   TSH 1.76 06/01/2017   INR 1.03 07/01/2017      Assessment / Plan:   Plan for porta cath placment  The goals risks and alternatives of the planned surgical procedure Procedure(s): INSERTION PORT-A-CATH (Bilateral)  have been discussed with the  patient in detail. The risks of the procedure including death, pneumothorax ,infection, stroke, myocardial infarction, bleeding, blood transfusion have all been discussed specifically.  I have quoted [name] a less than  1% of perioperative mortality and a complication rate as high as 10  %. The patient's questions have been answered.KENAN MOODIE is willing  to proceed with the planned procedure.      Grace Isaac MD      [address] [place] [address] Office [phone]   Beeper [phone]  07/01/2017 1:36 PM

## 2017-07-02 ENCOUNTER — Ambulatory Visit
Admission: RE | Admit: 2017-07-02 | Discharge: 2017-07-02 | Disposition: A | Discharge status: Home / Self Care | Payer: Managed Care, Other (non HMO) | Source: Ambulatory Visit | Attending: Radiation Oncology | Admitting: Radiation Oncology

## 2017-07-02 ENCOUNTER — Encounter (HOSPITAL_COMMUNITY): Payer: Self-pay | Admitting: Cardiothoracic Surgery

## 2017-07-02 ENCOUNTER — Other Ambulatory Visit: Payer: Self-pay

## 2017-07-02 DIAGNOSIS — Z510 Encounter for antineoplastic radiation therapy: Secondary | ICD-10-CM | POA: Diagnosis not present

## 2017-07-02 DIAGNOSIS — C3490 Malignant neoplasm of unspecified part of unspecified bronchus or lung: Secondary | ICD-10-CM

## 2017-07-02 MED ORDER — LIDOCAINE-PRILOCAINE 2.5-2.5 % EX CREA: 1.0000 "application " | TOPICAL_CREAM | CUTANEOUS | 1 refills | Status: DC | PRN

## 2017-07-02 MED FILL — LIDOCAINE-PRILOCAINE CREAM: 2.5-2.5 | 30 days supply | Qty: 30 | Fill #0

## 2017-07-02 NOTE — Op Note (Signed)
NAMEJACOB, Trevor Zavala                ACCOUNT NO.:  [id number]  MEDICAL RECORD NO.:  [id number]  LOCATION:                               FACILITY:  [facility]  PHYSICIAN:  Lanelle Bal, MD    DATE OF BIRTH:  Jul 23, 1966  DATE OF PROCEDURE:  07/01/2017 DATE OF DISCHARGE:                              OPERATIVE REPORT   PREOPERATIVE DIAGNOSIS:  Stage IV squamous cell carcinoma of the lung.  POSTOPERATIVE DIAGNOSIS:  Stage IV squamous cell carcinoma of the lung.  PROCEDURE PERFORMED:  Placement of Port-A-Cath, right internal jugular vein with fluoroscopic and ultrasound guidance.  SURGEON:  Lanelle Bal, MD  BRIEF HISTORY:  The patient is a 52 year old male, who was recently diagnosed with advanced stage squamous cell carcinoma of the lung and has started chemotherapy last week.  He was having difficulty with venous access and was referred by Oncology for placement of a port. Risks and options were discussed with the patient in detail and he was agreeable and signed informed consent.  DESCRIPTION OF PROCEDURE:  The patient under MAC anesthesia had the neck and upper chest prepped with Betadine and draped in a sterile manner. Appropriate time-out was performed, and we initially proceeded with accessing the right internal jugular vein with SonoSite guidance.  A single stick with fluoroscopic guidance into the vein was accomplished and a guidewire was positioned into the superior vena cava under fluoroscopic guidance.  Its position was confirmed as it easily passed also into the inferior cava.  We then proceeded with infiltrating lidocaine over the right anterior chest.  A subcutaneous pocket was created.  A 9.6-French preattached PowerPort catheter was selected.  The port was placed in the subcutaneous pocket and tunneled to the insertion site of the right IJ.  The catheter was trimmed to the appropriate length.  The peel-away sheath was passed over the guidewire into the superior vena  cava.  Guidewire and dilator were removed and through the peel-away sheath, the catheter was introduced and positioned in the superior vena cava.  The sheath was removed.  There was easy blood return.  The catheter had been flushed with dilute heparin, saline, 1000 units in 500 mL.  With the catheter in good position, we then placed concentrated heparin solution 2 mL in the port itself.  Fluoroscopy confirmed no evidence of pneumothorax and good position of the catheter. The port was anchored to the pectoral fascia.  The incision was then closed with interrupted 0 Vicryl, running 3-0 Vicryl in subcutaneous tissue at the port site and also the injection site.  Dermabond was applied.  The patient tolerated the procedure without obvious complications.  Sponge and needle count was reported as correct.  Blood loss was minimal.  The patient was returned to the recovery room for postoperative observation and to obtain a PA and lateral chest x-ray.  A total of 18 mL of lidocaine was used.     Lanelle Bal, MD   ______________________________ Lanelle Bal, MD    EG/MEDQ  D:  07/02/2017  T:  07/02/2017  Job:  [id number]

## 2017-07-02 NOTE — Telephone Encounter (Signed)
RX for Emla CR sent to pharm

## 2017-07-02 NOTE — Op Note (Deleted)
  The note originally documented on this encounter has been moved the the encounter in which it belongs.  

## 2017-07-03 ENCOUNTER — Ambulatory Visit
Admission: RE | Admit: 2017-07-03 | Discharge: 2017-07-03 | Disposition: A | Discharge status: Home / Self Care | Payer: Managed Care, Other (non HMO) | Source: Ambulatory Visit | Attending: Radiation Oncology | Admitting: Radiation Oncology

## 2017-07-03 ENCOUNTER — Telehealth: Payer: Self-pay | Admitting: Medical Oncology

## 2017-07-03 DIAGNOSIS — C3412 Malignant neoplasm of upper lobe, left bronchus or lung: Secondary | ICD-10-CM

## 2017-07-03 DIAGNOSIS — Z510 Encounter for antineoplastic radiation therapy: Secondary | ICD-10-CM | POA: Diagnosis not present

## 2017-07-03 MED ORDER — BIAFINE EX EMUL
Freq: Every day | CUTANEOUS | Status: DC
  Administered 2017-07-03: 17:00:00 via TOPICAL

## 2017-07-03 NOTE — Telephone Encounter (Signed)
I am doing pretty good , eating well , exercising and drinking a lot of vitamin water. I instructed him to also drink just water.

## 2017-07-06 ENCOUNTER — Encounter: Payer: Self-pay | Admitting: *Deleted

## 2017-07-06 ENCOUNTER — Other Ambulatory Visit (HOSPITAL_BASED_OUTPATIENT_CLINIC_OR_DEPARTMENT_OTHER): Payer: Managed Care, Other (non HMO)

## 2017-07-06 ENCOUNTER — Ambulatory Visit (HOSPITAL_BASED_OUTPATIENT_CLINIC_OR_DEPARTMENT_OTHER): Payer: Managed Care, Other (non HMO) | Admitting: Internal Medicine

## 2017-07-06 ENCOUNTER — Ambulatory Visit
Admission: RE | Admit: 2017-07-06 | Discharge: 2017-07-06 | Disposition: A | Discharge status: Home / Self Care | Payer: Managed Care, Other (non HMO) | Source: Ambulatory Visit | Attending: Radiation Oncology | Admitting: Radiation Oncology

## 2017-07-06 ENCOUNTER — Encounter: Payer: Self-pay | Admitting: Internal Medicine

## 2017-07-06 ENCOUNTER — Ambulatory Visit (HOSPITAL_BASED_OUTPATIENT_CLINIC_OR_DEPARTMENT_OTHER): Payer: Managed Care, Other (non HMO)

## 2017-07-06 VITALS — BP 122/74 | HR 86 | Temp 98.6°F | Resp 18 | Ht 70.0 in | Wt 164.2 lb

## 2017-07-06 DIAGNOSIS — C321 Malignant neoplasm of supraglottis: Secondary | ICD-10-CM | POA: Diagnosis not present

## 2017-07-06 DIAGNOSIS — C3492 Malignant neoplasm of unspecified part of left bronchus or lung: Secondary | ICD-10-CM

## 2017-07-06 DIAGNOSIS — R634 Abnormal weight loss: Secondary | ICD-10-CM | POA: Diagnosis not present

## 2017-07-06 DIAGNOSIS — Z510 Encounter for antineoplastic radiation therapy: Secondary | ICD-10-CM | POA: Diagnosis not present

## 2017-07-06 DIAGNOSIS — C3412 Malignant neoplasm of upper lobe, left bronchus or lung: Secondary | ICD-10-CM

## 2017-07-06 DIAGNOSIS — Z5111 Encounter for antineoplastic chemotherapy: Secondary | ICD-10-CM | POA: Diagnosis not present

## 2017-07-06 LAB — CBC WITH DIFFERENTIAL/PLATELET
BASO%: 0.6 % (ref 0.0–2.0)
BASOS ABS: 0.1 10*3/uL (ref 0.0–0.1)
EOS ABS: 0.1 10*3/uL (ref 0.0–0.5)
EOS%: 0.7 % (ref 0.0–7.0)
HEMATOCRIT: 40 % (ref 38.4–49.9)
HEMOGLOBIN: 13.6 g/dL (ref 13.0–17.1)
LYMPH#: 0.9 10*3/uL (ref 0.9–3.3)
LYMPH%: 7.7 % — ABNORMAL LOW (ref 14.0–49.0)
MCH: 32.7 pg (ref 27.2–33.4)
MCHC: 33.9 g/dL (ref 32.0–36.0)
MCV: 96.5 fL (ref 79.3–98.0)
MONO#: 0.6 10*3/uL (ref 0.1–0.9)
MONO%: 5.7 % (ref 0.0–14.0)
NEUT#: 9.4 10*3/uL — ABNORMAL HIGH (ref 1.5–6.5)
NEUT%: 85.3 % — AB (ref 39.0–75.0)
PLATELETS: 229 10*3/uL (ref 140–400)
RBC: 4.14 10*6/uL — ABNORMAL LOW (ref 4.20–5.82)
RDW: 12.5 % (ref 11.0–14.6)
WBC: 11 10*3/uL — ABNORMAL HIGH (ref 4.0–10.3)

## 2017-07-06 LAB — COMPREHENSIVE METABOLIC PANEL
ALBUMIN: 3 g/dL — AB (ref 3.5–5.0)
ALK PHOS: 83 U/L (ref 40–150)
ALT: 23 U/L (ref 0–55)
ANION GAP: 9 meq/L (ref 3–11)
AST: 16 U/L (ref 5–34)
BUN: 12.9 mg/dL (ref 7.0–26.0)
CALCIUM: 10.3 mg/dL (ref 8.4–10.4)
CHLORIDE: 104 meq/L (ref 98–109)
CO2: 29 mEq/L (ref 22–29)
CREATININE: 0.7 mg/dL (ref 0.7–1.3)
EGFR: >90 mL/min/{1.73_m2} (ref >90)
Glucose: 143 mg/dl — ABNORMAL HIGH (ref 70–140)
POTASSIUM: 4.2 meq/L (ref 3.5–5.1)
Sodium: 142 mEq/L (ref 136–145)
Total Bilirubin: 0.27 mg/dL (ref 0.20–1.20)
Total Protein: 6.5 g/dL (ref 6.4–8.3)

## 2017-07-06 MED ORDER — PALONOSETRON HCL INJECTION 0.25 MG/5ML
0.2500 mg | Freq: Once | INTRAVENOUS | Status: AC
  Administered 2017-07-06: 0.25 mg via INTRAVENOUS

## 2017-07-06 MED ORDER — PALONOSETRON HCL INJECTION 0.25 MG/5ML
INTRAVENOUS | Status: AC
  Filled 2017-07-06: qty 5

## 2017-07-06 MED ORDER — SODIUM CHLORIDE 0.9 % IV SOLN
Freq: Once | INTRAVENOUS | Status: AC
  Administered 2017-07-06: 10:00:00 via INTRAVENOUS

## 2017-07-06 MED ORDER — DIPHENHYDRAMINE HCL 50 MG/ML IJ SOLN
INTRAMUSCULAR | Status: AC
  Filled 2017-07-06: qty 1

## 2017-07-06 MED ORDER — DIPHENHYDRAMINE HCL 50 MG/ML IJ SOLN
50.0000 mg | Freq: Once | INTRAMUSCULAR | Status: AC
  Administered 2017-07-06: 50 mg via INTRAVENOUS

## 2017-07-06 MED ORDER — DEXAMETHASONE SODIUM PHOSPHATE 100 MG/10ML IJ SOLN
20.0000 mg | Freq: Once | INTRAMUSCULAR | Status: AC
  Administered 2017-07-06: 20 mg via INTRAVENOUS
  Filled 2017-07-06: qty 2

## 2017-07-06 MED ORDER — FAMOTIDINE IN NACL 20-0.9 MG/50ML-% IV SOLN
20.0000 mg | Freq: Once | INTRAVENOUS | Status: AC
  Administered 2017-07-06: 20 mg via INTRAVENOUS

## 2017-07-06 MED ORDER — FAMOTIDINE IN NACL 20-0.9 MG/50ML-% IV SOLN
INTRAVENOUS | Status: AC
  Filled 2017-07-06: qty 50

## 2017-07-06 MED ORDER — SODIUM CHLORIDE 0.9 % IV SOLN
280.0000 mg | Freq: Once | INTRAVENOUS | Status: AC
  Administered 2017-07-06: 280 mg via INTRAVENOUS
  Filled 2017-07-06: qty 28

## 2017-07-06 MED ORDER — SODIUM CHLORIDE 0.9 % IV SOLN
45.0000 mg/m2 | Freq: Once | INTRAVENOUS | Status: AC
  Administered 2017-07-06: 84 mg via INTRAVENOUS
  Filled 2017-07-06: qty 14

## 2017-07-06 NOTE — Patient Instructions (Signed)
Steps to Quit Smoking Smoking tobacco can be bad for your health. It can also affect almost every organ in your body. Smoking puts you and people around you at risk for many serious long-lasting (chronic) diseases. Quitting smoking is hard, but it is one of the best things that you can do for your health. It is never too late to quit. What are the benefits of quitting smoking? When you quit smoking, you lower your risk for getting serious diseases and conditions. They can include:  Lung cancer or lung disease.  Heart disease.  Stroke.  Heart attack.  Not being able to have children (infertility).  Weak bones (osteoporosis) and broken bones (fractures).  If you have coughing, wheezing, and shortness of breath, those symptoms may get better when you quit. You may also get sick less often. If you are pregnant, quitting smoking can help to lower your chances of having a baby of low birth weight. What can I do to help me quit smoking? Talk with your doctor about what can help you quit smoking. Some things you can do (strategies) include:  Quitting smoking totally, instead of slowly cutting back how much you smoke over a period of time.  Going to in-person counseling. You are more likely to quit if you go to many counseling sessions.  Using resources and support systems, such as: ? Online chats with a counselor. ? Phone quitlines. ? Printed self-help materials. ? Support groups or group counseling. ? Text messaging programs. ? Mobile phone apps or applications.  Taking medicines. Some of these medicines may have nicotine in them. If you are pregnant or breastfeeding, do not take any medicines to quit smoking unless your doctor says it is okay. Talk with your doctor about counseling or other things that can help you.  Talk with your doctor about using more than one strategy at the same time, such as taking medicines while you are also going to in-person counseling. This can help make  quitting easier. What things can I do to make it easier to quit? Quitting smoking might feel very hard at first, but there is a lot that you can do to make it easier. Take these steps:  Talk to your family and friends. Ask them to support and encourage you.  Call phone quitlines, reach out to support groups, or work with a counselor.  Ask people who smoke to not smoke around you.  Avoid places that make you want (trigger) to smoke, such as: ? Bars. ? Parties. ? Smoke-break areas at work.  Spend time with people who do not smoke.  Lower the stress in your life. Stress can make you want to smoke. Try these things to help your stress: ? Getting regular exercise. ? Deep-breathing exercises. ? Yoga. ? Meditating. ? Doing a body scan. To do this, close your eyes, focus on one area of your body at a time from head to toe, and notice which parts of your body are tense. Try to relax the muscles in those areas.  Download or buy apps on your mobile phone or tablet that can help you stick to your quit plan. There are many free apps, such as QuitGuide from the CDC (Centers for Disease Control and Prevention). You can find more support from smokefree.gov and other websites.  This information is not intended to replace advice given to you by your health care provider. Make sure you discuss any questions you have with your health care provider. Document Released: 08/23/2009 Document   Revised: 06/24/2016 Document Reviewed: 03/13/2015 Elsevier Interactive Patient Education  2018 [employer]vier Inc.  

## 2017-07-06 NOTE — Progress Notes (Signed)
[facility] [phone]   [phone]  OFFICE PROGRESS NOTE  [name], [name], MD [place] [address]  DIAGNOSIS:  1) Stage IIB/IV (T3, N3, M1a) non-small cell lung cancer, squamous cell carcinoma presented with large left hilar mass in addition to mediastinal and left supraclavicular lymphadenopathy as well as contralateral right upper lobe nodule diagnosed in August 2018. PDL 1 expression: 90%. 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 First dose 06/29/2017. Status post one cycle.  INTERVAL HISTORY: Trevor Zavala 51 y.o. male returns to the clinic today for follow-up visit accompanied by his wife. The patient started a course of concurrent chemoradiation with weekly carboplatin and paclitaxel last week and he tolerated the first week of his treatment fairly well with no significant adverse effects. He continues to have pain on the left anterior chest and mid upper back. He is currently on hydrocodone with some help. He denied having any significant shortness breath but continues to have mild cough with no hemoptysis. He lost only 2 pounds since his last visit. He denied having any nausea, vomiting, diarrhea or constipation. He denied having any fever or chills. He is here today for evaluation before starting cycle #2.  MEDICAL HISTORY: Past Medical History:  Diagnosis Date  . Anxiety   . COPD (chronic obstructive pulmonary disease) ([facility])   . GERD (gastroesophageal reflux disease)   . Headache   . Hypertension   . Pneumonia   . Situational depression   . Stage III squamous cell carcinoma of left lung ([facility]) 06/19/2017    ALLERGIES:  has No Known Allergies.  MEDICATIONS:  Current Outpatient Prescriptions  Medication Sig Dispense Refill  . aspirin (ASPIRIN CHILDRENS) 81  MG chewable tablet Chew 1 tablet (81 mg total) by mouth daily. 100 tablet 11  . BREO ELLIPTA 100-25 MCG/INH AEPB INHALE 1 PUFF INTO THE LUNGS DAILY 60 each 11  . Cholecalciferol (VITAMIN D PO) Take 1 tablet by mouth daily.    . clonazePAM (KLONOPIN) 0.5 MG tablet Take 1 tablet (0.5 mg total) by mouth 2 (two) times daily as needed for anxiety. 180 tablet 0  . emollient (BIAFINE) cream Apply topically as needed.    Marland Kitchen esomeprazole (NEXIUM) 40 MG capsule TAKE 1 CAPSULE BY MOUTH DAILY WITH BREAKFAST 90 capsule 2  . HYDROcodone-acetaminophen (LORTAB) 5-325 MG tablet Take 1 tablet by mouth every 6 (six) hours as needed for moderate pain. 15 tablet 0  . lidocaine-prilocaine (EMLA) cream Apply 1 application topically as needed. 30 g 1  . loratadine (CLARITIN) 10 MG tablet Take 10 mg by mouth daily as needed for allergies.    . nicotine (NICODERM CQ) 21 mg/24hr patch Place 1 patch (21 mg total) onto the skin daily. 28 patch 0  . valsartan (DIOVAN) 160 MG tablet Take 1 tablet (160 mg total) by mouth daily. 30 tablet 11  . acyclovir (ZOVIRAX) 400 MG tablet TAKE 1 TABLET (400 MG TOTAL) BY MOUTH 3 (THREE) TIMES DAILY. (Patient not taking: Reported on 07/06/2017) 21 tablet 3  . naproxen sodium (ANAPROX) 220 MG tablet Take 220 mg by mouth 2 (two) times daily with a meal.    . prochlorperazine (COMPAZINE) 10 MG tablet Take 1 tablet (10 mg total) by mouth every 6 (six) hours as needed for nausea or vomiting. (Patient not taking:  Reported on 07/06/2017) 30 tablet 0   No current facility-administered medications for this visit.     SURGICAL HISTORY:  Past Surgical History:  Procedure Laterality Date  . COLONOSCOPY    . DIRECT LARYNGOSCOPY N/A 06/25/2017   Procedure: DIRECT LARYNGOSCOPY AND BIOPSY;  Surgeon: Rozetta Nunnery, MD;  Location: [facility];  Service: ENT;  Laterality: N/A;  . EAR CYST EXCISION N/A 11/17/2013   Procedure: SEBACEOUS CYST CHEST;  Surgeon: Joyice Faster. Cornett, MD;  Location:  [facility];  Service: General;  Laterality: N/A;  . KNEE ARTHROSCOPY     LEFT  . LIPOMA EXCISION N/A 11/17/2013   Procedure: EXCISION LIPOMA FOREHEAD;  Surgeon: Joyice Faster. Cornett, MD;  Location: [facility];  Service: General;  Laterality: N/A;  . LUNG BIOPSY Bilateral 06/12/2017   Procedure: LEFT LUNG BIOPSY;  Surgeon: Grace Isaac, MD;  Location: [facility];  Service: Thoracic;  Laterality: Bilateral;  . PORTACATH PLACEMENT Right 07/01/2017   Procedure: INSERTION PORT-A-CATH - RIGHT IJ - placed with Fluoro and Ultrasound;  Surgeon: Grace Isaac, MD;  Location: [facility];  Service: Thoracic;  Laterality: Right;  Marland Kitchen VIDEO BRONCHOSCOPY WITH ENDOBRONCHIAL ULTRASOUND N/A 06/12/2017   Procedure: VIDEO BRONCHOSCOPY WITH ENDOBRONCHIAL ULTRASOUND;  Surgeon: Grace Isaac, MD;  Location: [facility];  Service: Thoracic;  Laterality: N/A;    REVIEW OF SYSTEMS:  A comprehensive review of systems was negative except for: Constitutional: positive for fatigue and weight loss Respiratory: positive for pleurisy/chest pain Musculoskeletal: positive for neck pain   PHYSICAL EXAMINATION: General appearance: alert, cooperative, fatigued and no distress Head: Normocephalic, without obvious abnormality, atraumatic Neck: no adenopathy, no JVD, supple, symmetrical, trachea midline and thyroid not enlarged, symmetric, no tenderness/mass/nodules Lymph nodes: Cervical, supraclavicular, and axillary nodes normal. Resp: clear to auscultation bilaterally Back: symmetric, no curvature. ROM normal. No CVA tenderness. Cardio: regular rate and rhythm, S1, S2 normal, no murmur, click, rub or gallop GI: soft, non-tender; bowel sounds normal; no masses,  no organomegaly Extremities: extremities normal, atraumatic, no cyanosis or edema  ECOG PERFORMANCE STATUS: 1 - Symptomatic but completely ambulatory  Blood pressure 122/74, pulse 86, temperature 98.6 F (37 C), temperature source Oral, resp. rate  18, height 5\' 10"  (1.778 m), weight 164 lb 3.2 oz (74.5 kg), SpO2 99 %.  LABORATORY DATA: Lab Results  Component Value Date   WBC 11.0 (H) 07/06/2017   HGB 13.6 07/06/2017   HCT 40.0 07/06/2017   MCV 96.5 07/06/2017   PLT 229 07/06/2017      Chemistry      Component Value Date/Time   NA 142 07/06/2017 0843   K 4.2 07/06/2017 0843   CL 102 06/06/2017 1045   CO2 29 07/06/2017 0843   BUN 12.9 07/06/2017 0843   CREATININE 0.7 07/06/2017 0843      Component Value Date/Time   CALCIUM 10.3 07/06/2017 0843   ALKPHOS 83 07/06/2017 0843   AST 16 07/06/2017 0843   ALT 23 07/06/2017 0843   BILITOT 0.27 07/06/2017 0843       RADIOGRAPHIC STUDIES: Dg Chest 2 View  Result Date: 07/01/2017 CLINICAL DATA:  Port-A-Cath insertion. EXAM: CHEST  2 VIEW 2:38 p.m. COMPARISON:  07/01/2017 at 11:09 a.m. FINDINGS: R port appears in good position. No pneumothorax. Heart size and vascularity are normal. Fullness in the left hilum is consistent with the previously demonstrated mass. Small spiculated nodule in the right upper lobe seen on the lateral view. Slight atelectasis in the lingula.  IMPRESSION: Power port in good position.  No pneumothorax. Left hilar and mediastinal mass as demonstrated on prior PET-CT. Metastatic nodule in the right upper lobe. Lingular atelectasis. Electronically Signed   By: Lorriane Shire M.D.   On: 07/01/2017 15:48   Dg Chest 2 View  Result Date: 07/01/2017 CLINICAL DATA:  Preop evaluation for upcoming port insertion, initial encounter EXAM: CHEST  2 VIEW COMPARISON:  06/12/2017 FINDINGS: Cardiac shadow is stable. The lungs are well aerated bilaterally. Stable chronic changes are noted in the lingula. The known left upper lobe nodule is not well appreciated. Some fullness in the left hilar region is noted consistent with the given clinical history of lung carcinoma. No acute bony abnormality is seen. IMPRESSION: No change from previous exams. Electronically Signed   By: Inez Catalina M.D.   On: 07/01/2017 12:23   Dg Chest 2 View  Result Date: 06/12/2017 CLINICAL DATA:  Preoperative evaluation for lung biopsy. EXAM: CHEST  2 VIEW COMPARISON:  Prior PET-CT from 06/10/2017. FINDINGS: Transverse heart size within normal limits. Asymmetric fullness within the left hilum, compatible with known left hilar mass. Mediastinal silhouette otherwise normal. Lungs normally inflated. Probable postobstructive atelectasis within the lingula, similar to previous. No focal infiltrates. No pulmonary edema or pleural effusion. No pneumothorax. Known right upper lobe nodule not well visualized. No acute osseous abnormality. IMPRESSION: 1. Asymmetric fullness at the left hilum, consistent with known left hilar mass. Associated postobstructive atelectasis within the lingula. 2. No other active cardiopulmonary disease. Electronically Signed   By: Jeannine Boga M.D.   On: 06/12/2017 06:58   Dg Chest 2 View  Result Date: 06/06/2017 CLINICAL DATA:  Shortness of breath, cough, left hilar mass EXAM: CHEST  2 VIEW COMPARISON:  05/27/2017, 06/05/2017 FINDINGS: Mild hyperinflation compatible with background emphysema. Left hilar prominence correlates with the CT from yesterday compatible with the ill-defined infiltrative left hilar/ mediastinal mass. Streaky lingula bandlike opacity, compatible with atelectasis. Right lung remains clear. No significant interval change. No new focal acute airspace process, significant lung collapse, edema, effusion or pneumothorax. Trachea is midline. Monitor leads overlie the chest. No acute osseous finding. No soft tissue abnormality appreciated IMPRESSION: Mild hilar prominence correlates with the left hilar/ mediastinal infiltrative mass by CT from yesterday. Minor lingula partial collapse/ atelectasis Mild hyperinflation No significant interval change or acute process by plain radiography Electronically Signed   By: Jerilynn Mages.  Shick M.D.   On: 06/06/2017 10:57   Ct Angio  Chest Pe W And/or Wo Contrast  Result Date: 06/06/2017 CLINICAL DATA:  Shortness of breath, cough EXAM: CT ANGIOGRAPHY CHEST WITH CONTRAST TECHNIQUE: Multidetector CT imaging of the chest was performed using the standard protocol during bolus administration of intravenous contrast. Multiplanar CT image reconstructions and MIPs were obtained to evaluate the vascular anatomy. CONTRAST:  80 cc Isovue 370 IV COMPARISON:  Chest x-ray earlier today.  Chest CT 06/05/2017 FINDINGS: Cardiovascular: No filling defects in the pulmonary artery to suggest pulmonary emboli. Atherosclerotic calcifications scattered in the aorta. No evidence of aneurysm. Left pulmonary arterial encasement by tumor as described below. Mediastinum/Nodes: Again noted is the large left hilar mass encasing the pulmonary vessels and left upper lobe bronchus, unchanged since study 2 days ago. AP window adenopathy is stable. No right hilar or axillary adenopathy. Left supraclavicular adenopathy again noted, unchanged. Lungs/Pleura: Airway thickening bilaterally, likely bronchitis. No effusions. Lingular atelectasis. Patchy pneumonitis in the left upper lobe, stable. Previously seen 7 mm nodule in the left upper lobe is not as well  visualized but likely present on image 27. Right upper lobe nodule anteriorly on image 47 measures 10 mm, stable. No effusions. Upper Abdomen: Imaging into the upper abdomen shows no acute findings. Musculoskeletal: Chest wall soft tissues are unremarkable. No acute bony abnormality. Review of the MIP images confirms the above findings. IMPRESSION: No evidence of pulmonary embolus. Stable encasement of the left pulmonary vessels and upper lobe bronchus by a irregular left hilar mass. Associated AP window/mediastinal adenopathy and left supraclavicular adenopathy. Findings are stable since study 2 days ago. Findings highly suspicious for bronchogenic carcinoma. Stable bilateral pulmonary nodules. Stable left upper lobe  pneumonitis and diffuse peribronchial thickening. Electronically Signed   By: Rolm Baptise M.D.   On: 06/06/2017 13:49   Mr Jeri Cos IR Contrast  Result Date: 06/11/2017 CLINICAL DATA:  51 y/o M; possible lung cancer, biopsy to confirm 06/12/2017, evaluate for intracranial metastasis. EXAM: MRI HEAD WITHOUT AND WITH CONTRAST TECHNIQUE: Multiplanar, multiecho pulse sequences of the brain and surrounding structures were obtained without and with intravenous contrast. CONTRAST:  70mL MULTIHANCE GADOBENATE DIMEGLUMINE 529 MG/ML IV SOLN COMPARISON:  None. FINDINGS: Brain: No acute infarction, hemorrhage, hydrocephalus, extra-axial collection or mass lesion. Few scattered nonspecific punctate foci of T2 FLAIR hyperintense signal abnormality in subcortical white matter is consistent with mild chronic microvascular ischemic changes. Mild brain parenchymal volume loss. No abnormal enhancement of the brain. Vascular: Normal flow voids. Skull and upper cervical spine: Normal marrow signal. No abnormal enhancement. Sinuses/Orbits: Mild paranasal sinus mucosal thickening and small left maxillary sinus mucous retention cyst. No abnormal signal of mastoid air cells. Normal orbits. Other: None. IMPRESSION: 1. No intracranial metastatic disease identified. 2. Mild chronic microvascular ischemic changes and mild parenchymal volume loss of the brain. Electronically Signed   By: Kristine Garbe M.D.   On: 06/11/2017 22:10   Nm Pet Image Initial (pi) Skull Base To Thigh  Result Date: 06/10/2017 CLINICAL DATA:  Initial treatment strategy for left upper lobe mass on CT, presumed bronchogenic carcinoma. EXAM: NUCLEAR MEDICINE PET SKULL BASE TO THIGH TECHNIQUE: 12.51 mCi F-18 FDG was injected intravenously. Full-ring PET imaging was performed from the skull base to thigh after the radiotracer. CT data was obtained and used for attenuation correction and anatomic localization. FASTING BLOOD GLUCOSE:  Value: 84 Mg/dl COMPARISON:   Chest CT 06/05/2017 and 06/06/2017. FINDINGS: NECK Hypermetabolic left supraclavicular node measures 16 mm on image 74 and has an SUV max of 13.4. No other hypermetabolic cervical lymph nodes. There is midline hypermetabolic activity involving the base of the epiglottis. This has an SUV max of 5.6. No focal lesion is apparent in this area on the CT images.No other lesions of the pharyngeal mucosal space are seen. CHEST The large left hilar mass is markedly hypermetabolic. This has an SUV max of 22.2 and measures approximately 3.8 x 6.2 cm on image 107. This encases the left upper lobe bronchus and abuts the left mainstem bronchus to the level of the carina. No endobronchial component identified. In addition to the left supraclavicular node, there are small hypermetabolic left paratracheal (SUV max 5.7) and subcarinal (SUV max 3.4) lymph nodes. There is a small hypermetabolic inferior right hilar node (SUV max 2.6). There are no peripheral hypermetabolic nodules within the left lung. There is stable mild pneumonitis within the left upper lobe. The small spiculated right upper lobe nodule is hypermetabolic. This nodule measures 12 x 8 mm on image 97 and has an SUV max of 3.5. ABDOMEN/PELVIS There is no hypermetabolic activity  within the liver, adrenal glands, spleen or pancreas. Specifically, there is no abnormal metabolic activity within the tiny left adrenal nodule. There are no hypermetabolic lymph nodes in the abdomen or pelvis. CT images demonstrate mild aortoiliac atherosclerosis. SKELETON There is no hypermetabolic activity to suggest osseous metastatic disease. IMPRESSION: 1. 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 The left hilar mass demonstrates central extension in the AP window, abutting the left mainstem bronchus to the level of the carina. 2. Contralateral 12 mm  hypermetabolic right upper lobe nodule is not morphologically typical for a metastasis and may reflect a synchronous primary cancer. Mildly hypermetabolic inferior right hilar node, nonspecific. 3. No evidence of metastatic disease within the abdomen or pelvis. 4. Indeterminate hypermetabolic activity at the base of the epiglottis. This could reflect a small mucosal neoplasm or focal inflammation. Direct visualization recommended. Electronically Signed   By: Richardean Sale M.D.   On: 06/10/2017 16:27   Dg Fluoro Guide Cv Line-no Report  Result Date: 07/01/2017 Fluoroscopy was utilized by the requesting physician.  No radiographic interpretation.    ASSESSMENT AND PLAN: This is a very pleasant 51 years old white male with recently diagnosed stage IIIB/IV non-small cell lung cancer, adenocarcinoma presented with large left hilar mass in addition to mediastinal and left supraclavicular lymphadenopathy as well as suspicious right upper lobe pulmonary nodule diagnosed in August 2018. 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 He is currently undergoing a course of concurrent chemoradiation to the lung as well as the epiglottic area under the care of Dr. Tammi Klippel. He is status post 1 cycle. The patient tolerated the first cycle of his treatment fairly well. I recommended for the patient to proceed with cycle #2 today as a scheduled. I will see him back for follow-up visit in 2 weeks for evaluation before starting cycle #4. The patient was advised to call immediately if he has any concerning symptoms in the interval. The patient voices understanding of current disease status and treatment options and is in agreement with the current care plan. All questions were answered. The patient knows to call the clinic with any problems, questions or concerns. We can certainly see the patient much sooner if necessary. Disclaimer: This note was dictated with voice  recognition software. Similar sounding words can inadvertently be transcribed and may not be corrected upon review.

## 2017-07-06 NOTE — Progress Notes (Signed)
At this time there is no documentation of port tip location.  IR has been notified and request has been made for an addendum to be added to CXR report.  Per pt request a PIV has been placed for today's treatment so as not to delay treatment start.

## 2017-07-06 NOTE — Patient Instructions (Signed)
[facility] Discharge Instructions for Patients Receiving Chemotherapy  Today you received the following chemotherapy agents Carboplatin, Taxol.   To help prevent nausea and vomiting after your treatment, we encourage you to take your nausea medication as prescribed.    If you develop nausea and vomiting that is not controlled by your nausea medication, call the clinic.   BELOW ARE SYMPTOMS THAT SHOULD BE REPORTED IMMEDIATELY:  *FEVER GREATER THAN 100.5 F  *CHILLS WITH OR WITHOUT FEVER  NAUSEA AND VOMITING THAT IS NOT CONTROLLED WITH YOUR NAUSEA MEDICATION  *UNUSUAL SHORTNESS OF BREATH  *UNUSUAL BRUISING OR BLEEDING  TENDERNESS IN MOUTH AND THROAT WITH OR WITHOUT PRESENCE OF ULCERS  *URINARY PROBLEMS  *BOWEL PROBLEMS  UNUSUAL RASH Items with * indicate a potential emergency and should be followed up as soon as possible.  Feel free to call the clinic you have any questions or concerns. The clinic phone number is [phone].  Please show the [facility] at check-in to the Emergency Department and triage nurse.

## 2017-07-07 ENCOUNTER — Ambulatory Visit
Admission: RE | Admit: 2017-07-07 | Discharge: 2017-07-07 | Disposition: A | Discharge status: Home / Self Care | Payer: Managed Care, Other (non HMO) | Source: Ambulatory Visit | Attending: Radiation Oncology | Admitting: Radiation Oncology

## 2017-07-07 ENCOUNTER — Encounter: Payer: Self-pay | Admitting: *Deleted

## 2017-07-07 DIAGNOSIS — Z510 Encounter for antineoplastic radiation therapy: Secondary | ICD-10-CM | POA: Diagnosis not present

## 2017-07-07 NOTE — Progress Notes (Signed)
Oncology Nurse Navigator Documentation  Oncology Nurse Navigator Flowsheets 07/07/2017  Navigator Location [facility]  Navigator Encounter Type Telephone;Other/I received a call from patient's wife. She states labs were not drawn from port. I contacted labs and was told patient needs flush appt with every lab. I completed scheduling notification to schedule flushes with each lab.  I then called wife back to update. I was unable to reach but left vm message for her to call me.   Telephone Incoming Call  Treatment Phase Treatment  Barriers/Navigation Needs Coordination of Care  Interventions Coordination of Care  Coordination of Care Other  Acuity Level 2  Acuity Level 2 Other  Time Spent with Patient 30

## 2017-07-08 ENCOUNTER — Ambulatory Visit
Admission: RE | Admit: 2017-07-08 | Discharge: 2017-07-08 | Disposition: A | Discharge status: Home / Self Care | Payer: Managed Care, Other (non HMO) | Source: Ambulatory Visit | Attending: Radiation Oncology | Admitting: Radiation Oncology

## 2017-07-08 DIAGNOSIS — Z510 Encounter for antineoplastic radiation therapy: Secondary | ICD-10-CM | POA: Diagnosis not present

## 2017-07-09 ENCOUNTER — Ambulatory Visit
Admission: RE | Admit: 2017-07-09 | Discharge: 2017-07-09 | Disposition: A | Discharge status: Home / Self Care | Payer: Managed Care, Other (non HMO) | Source: Ambulatory Visit | Attending: Radiation Oncology | Admitting: Radiation Oncology

## 2017-07-09 ENCOUNTER — Encounter: Payer: Self-pay | Admitting: *Deleted

## 2017-07-09 DIAGNOSIS — Z510 Encounter for antineoplastic radiation therapy: Secondary | ICD-10-CM | POA: Diagnosis not present

## 2017-07-09 NOTE — Progress Notes (Signed)
Oncology Nurse Navigator Documentation  Met with Mr. Darko, introduced my self as the H&N Navigator, explained I am an additional resource as he continues with treatment for his lung and epiglottic ISCC.  Provided him my contact information.  He voiced appreciation.  Gayleen Orem, RN, BSN, [place] [facility] Oncology Nurse [facility] [phone]

## 2017-07-10 ENCOUNTER — Ambulatory Visit
Admission: RE | Admit: 2017-07-10 | Discharge: 2017-07-10 | Disposition: A | Discharge status: Home / Self Care | Payer: Managed Care, Other (non HMO) | Source: Ambulatory Visit | Attending: Radiation Oncology | Admitting: Radiation Oncology

## 2017-07-10 DIAGNOSIS — Z510 Encounter for antineoplastic radiation therapy: Secondary | ICD-10-CM | POA: Diagnosis not present

## 2017-07-14 ENCOUNTER — Telehealth: Payer: Self-pay | Admitting: Radiation Oncology

## 2017-07-14 ENCOUNTER — Ambulatory Visit
Admission: RE | Admit: 2017-07-14 | Discharge: 2017-07-14 | Disposition: A | Discharge status: Home / Self Care | Payer: Managed Care, Other (non HMO) | Source: Ambulatory Visit | Attending: Radiation Oncology | Admitting: Radiation Oncology

## 2017-07-14 ENCOUNTER — Ambulatory Visit (HOSPITAL_BASED_OUTPATIENT_CLINIC_OR_DEPARTMENT_OTHER): Payer: Managed Care, Other (non HMO)

## 2017-07-14 ENCOUNTER — Telehealth: Payer: Self-pay

## 2017-07-14 ENCOUNTER — Other Ambulatory Visit: Payer: Self-pay | Admitting: Radiation Oncology

## 2017-07-14 ENCOUNTER — Other Ambulatory Visit (HOSPITAL_BASED_OUTPATIENT_CLINIC_OR_DEPARTMENT_OTHER): Payer: Managed Care, Other (non HMO)

## 2017-07-14 VITALS — BP 94/55 | HR 55 | Temp 98.4°F | Resp 17

## 2017-07-14 DIAGNOSIS — C321 Malignant neoplasm of supraglottis: Secondary | ICD-10-CM

## 2017-07-14 DIAGNOSIS — C3412 Malignant neoplasm of upper lobe, left bronchus or lung: Secondary | ICD-10-CM

## 2017-07-14 DIAGNOSIS — Z5111 Encounter for antineoplastic chemotherapy: Secondary | ICD-10-CM | POA: Diagnosis not present

## 2017-07-14 DIAGNOSIS — C3492 Malignant neoplasm of unspecified part of left bronchus or lung: Secondary | ICD-10-CM

## 2017-07-14 DIAGNOSIS — Z510 Encounter for antineoplastic radiation therapy: Secondary | ICD-10-CM | POA: Diagnosis not present

## 2017-07-14 LAB — CBC WITH DIFFERENTIAL/PLATELET
BASO%: 0.1 % (ref 0.0–2.0)
BASOS ABS: 0 10*3/uL (ref 0.0–0.1)
EOS ABS: 0 10*3/uL (ref 0.0–0.5)
EOS%: 0.4 % (ref 0.0–7.0)
HCT: 38 % — ABNORMAL LOW (ref 38.4–49.9)
HGB: 12.8 g/dL — ABNORMAL LOW (ref 13.0–17.1)
LYMPH%: 10.7 % — AB (ref 14.0–49.0)
MCH: 32.4 pg (ref 27.2–33.4)
MCHC: 33.7 g/dL (ref 32.0–36.0)
MCV: 96.2 fL (ref 79.3–98.0)
MONO#: 0.7 10*3/uL (ref 0.1–0.9)
MONO%: 9.3 % (ref 0.0–14.0)
NEUT#: 5.9 10*3/uL (ref 1.5–6.5)
NEUT%: 79.5 % — ABNORMAL HIGH (ref 39.0–75.0)
PLATELETS: 284 10*3/uL (ref 140–400)
RBC: 3.95 10*6/uL — AB (ref 4.20–5.82)
RDW: 12.3 % (ref 11.0–14.6)
WBC: 7.4 10*3/uL (ref 4.0–10.3)
lymph#: 0.8 10*3/uL — ABNORMAL LOW (ref 0.9–3.3)

## 2017-07-14 LAB — COMPREHENSIVE METABOLIC PANEL
ALBUMIN: 2.9 g/dL — AB (ref 3.5–5.0)
ALT: 25 U/L (ref 0–55)
ANION GAP: 9 meq/L (ref 3–11)
AST: 13 U/L (ref 5–34)
Alkaline Phosphatase: 94 U/L (ref 40–150)
BILIRUBIN TOTAL: 0.36 mg/dL (ref 0.20–1.20)
BUN: 13.1 mg/dL (ref 7.0–26.0)
CO2: 27 meq/L (ref 22–29)
CREATININE: 0.7 mg/dL (ref 0.7–1.3)
Calcium: 10.7 mg/dL — ABNORMAL HIGH (ref 8.4–10.4)
Chloride: 99 mEq/L (ref 98–109)
EGFR: >90 mL/min/{1.73_m2} (ref >90)
Glucose: 181 mg/dl — ABNORMAL HIGH (ref 70–140)
Potassium: 4.1 mEq/L (ref 3.5–5.1)
SODIUM: 135 meq/L — AB (ref 136–145)
Total Protein: 6.9 g/dL (ref 6.4–8.3)

## 2017-07-14 MED ORDER — PALONOSETRON HCL INJECTION 0.25 MG/5ML
0.2500 mg | Freq: Once | INTRAVENOUS | Status: AC
  Administered 2017-07-14: 0.25 mg via INTRAVENOUS

## 2017-07-14 MED ORDER — SODIUM CHLORIDE 0.9 % IV SOLN
Freq: Once | INTRAVENOUS | Status: AC
  Administered 2017-07-14: 10:00:00 via INTRAVENOUS

## 2017-07-14 MED ORDER — SODIUM CHLORIDE 0.9 % IV SOLN
20.0000 mg | Freq: Once | INTRAVENOUS | Status: AC
  Administered 2017-07-14: 20 mg via INTRAVENOUS
  Filled 2017-07-14: qty 2

## 2017-07-14 MED ORDER — FAMOTIDINE IN NACL 20-0.9 MG/50ML-% IV SOLN
INTRAVENOUS | Status: AC
  Filled 2017-07-14: qty 50

## 2017-07-14 MED ORDER — RADIAPLEXRX EX GEL
Freq: Once | CUTANEOUS | Status: AC
  Administered 2017-07-14: 09:00:00 via TOPICAL

## 2017-07-14 MED ORDER — SODIUM CHLORIDE 0.9% FLUSH
10.0000 mL | INTRAVENOUS | Status: DC | PRN
  Administered 2017-07-14: 10 mL
  Filled 2017-07-14: qty 10

## 2017-07-14 MED ORDER — DIPHENHYDRAMINE HCL 50 MG/ML IJ SOLN
INTRAMUSCULAR | Status: AC
  Filled 2017-07-14: qty 1

## 2017-07-14 MED ORDER — SODIUM CHLORIDE 0.9 % IV SOLN
277.0000 mg | Freq: Once | INTRAVENOUS | Status: AC
  Administered 2017-07-14: 280 mg via INTRAVENOUS
  Filled 2017-07-14: qty 28

## 2017-07-14 MED ORDER — PALONOSETRON HCL INJECTION 0.25 MG/5ML
INTRAVENOUS | Status: AC
  Filled 2017-07-14: qty 5

## 2017-07-14 MED ORDER — HEPARIN SOD (PORK) LOCK FLUSH 100 UNIT/ML IV SOLN
500.0000 [IU] | Freq: Once | INTRAVENOUS | Status: AC | PRN
  Administered 2017-07-14: 500 [IU]
  Filled 2017-07-14: qty 5

## 2017-07-14 MED ORDER — PACLITAXEL CHEMO INJECTION 300 MG/50ML
45.0000 mg/m2 | Freq: Once | INTRAVENOUS | Status: AC
  Administered 2017-07-14: 84 mg via INTRAVENOUS
  Filled 2017-07-14: qty 14

## 2017-07-14 MED ORDER — SUCRALFATE 1 G PO TABS: 1.0000 g | ORAL_TABLET | Freq: Three times a day (TID) | ORAL | 1 refills | Status: DC

## 2017-07-14 MED ORDER — FAMOTIDINE IN NACL 20-0.9 MG/50ML-% IV SOLN
20.0000 mg | Freq: Once | INTRAVENOUS | Status: AC
  Administered 2017-07-14: 20 mg via INTRAVENOUS

## 2017-07-14 MED ORDER — SODIUM CHLORIDE 0.9 % IV SOLN
Freq: Once | INTRAVENOUS | Status: AC
  Administered 2017-07-14: 09:00:00 via INTRAVENOUS

## 2017-07-14 MED ORDER — HYDROCODONE-ACETAMINOPHEN 5-325 MG PO TABS: 1.0000 | ORAL_TABLET | Freq: Four times a day (QID) | ORAL | 0 refills | Status: DC | PRN

## 2017-07-14 MED ORDER — DIPHENHYDRAMINE HCL 50 MG/ML IJ SOLN
50.0000 mg | Freq: Once | INTRAMUSCULAR | Status: AC
  Administered 2017-07-14: 50 mg via INTRAVENOUS

## 2017-07-14 MED FILL — HYDROCODON-APAP 5-325: 5-325 | 15 days supply | Qty: 60 | Fill #0

## 2017-07-14 MED FILL — SUCRALFATE 1 GM TABLET: 1 | 30 days supply | Qty: 120 | Fill #0

## 2017-07-14 NOTE — Telephone Encounter (Signed)
Wife had called yesterday and LVM when we were closed. When this RN called back, the pt was already at [facility] and been seen by rad onc nurse. Wife was "OK" with the situation.

## 2017-07-14 NOTE — Telephone Encounter (Signed)
Received voicemail message from patient's wife, Jenny Reichmann, requesting medication to soothe throat, medication for back pain, and more Radiaplex. Delivered norco script and radiaplex to patient in infusion room. Explained Carafate has been called to [facility] pharmacy and reviewed directions for use. Patient and wife verbalized understanding and appreciation for all.

## 2017-07-14 NOTE — Patient Instructions (Signed)
[facility] Discharge Instructions for Patients Receiving Chemotherapy  Today you received the following chemotherapy agents Carboplatin, Taxol.   To help prevent nausea and vomiting after your treatment, we encourage you to take your nausea medication as prescribed. NO ZOFRAN FOR 3 DAYS AFTER CHEMO   If you develop nausea and vomiting that is not controlled by your nausea medication, call the clinic.   BELOW ARE SYMPTOMS THAT SHOULD BE REPORTED IMMEDIATELY:  *FEVER GREATER THAN 100.5 F  *CHILLS WITH OR WITHOUT FEVER  NAUSEA AND VOMITING THAT IS NOT CONTROLLED WITH YOUR NAUSEA MEDICATION  *UNUSUAL SHORTNESS OF BREATH  *UNUSUAL BRUISING OR BLEEDING  TENDERNESS IN MOUTH AND THROAT WITH OR WITHOUT PRESENCE OF ULCERS  *URINARY PROBLEMS  *BOWEL PROBLEMS  UNUSUAL RASH Items with * indicate a potential emergency and should be followed up as soon as possible.  Feel free to call the clinic you have any questions or concerns. The clinic phone number is [phone].  Please show the [facility] at check-in to the Emergency Department and triage nurse.

## 2017-07-15 ENCOUNTER — Ambulatory Visit
Admission: RE | Admit: 2017-07-15 | Discharge: 2017-07-15 | Disposition: A | Discharge status: Home / Self Care | Payer: Managed Care, Other (non HMO) | Source: Ambulatory Visit | Attending: Radiation Oncology | Admitting: Radiation Oncology

## 2017-07-15 DIAGNOSIS — Z510 Encounter for antineoplastic radiation therapy: Secondary | ICD-10-CM | POA: Diagnosis not present

## 2017-07-16 ENCOUNTER — Ambulatory Visit
Admission: RE | Admit: 2017-07-16 | Discharge: 2017-07-16 | Disposition: A | Discharge status: Home / Self Care | Payer: Managed Care, Other (non HMO) | Source: Ambulatory Visit | Attending: Radiation Oncology | Admitting: Radiation Oncology

## 2017-07-16 DIAGNOSIS — Z510 Encounter for antineoplastic radiation therapy: Secondary | ICD-10-CM | POA: Diagnosis not present

## 2017-07-17 ENCOUNTER — Other Ambulatory Visit: Payer: Self-pay | Admitting: Radiation Oncology

## 2017-07-17 ENCOUNTER — Ambulatory Visit
Admission: RE | Admit: 2017-07-17 | Discharge: 2017-07-17 | Disposition: A | Discharge status: Home / Self Care | Payer: Managed Care, Other (non HMO) | Source: Ambulatory Visit | Attending: Radiation Oncology | Admitting: Radiation Oncology

## 2017-07-17 ENCOUNTER — Ambulatory Visit: Payer: Managed Care, Other (non HMO) | Admitting: Internal Medicine

## 2017-07-17 ENCOUNTER — Encounter: Payer: Managed Care, Other (non HMO) | Admitting: Internal Medicine

## 2017-07-17 DIAGNOSIS — C321 Malignant neoplasm of supraglottis: Secondary | ICD-10-CM

## 2017-07-17 DIAGNOSIS — Z510 Encounter for antineoplastic radiation therapy: Secondary | ICD-10-CM | POA: Diagnosis not present

## 2017-07-17 DIAGNOSIS — C3412 Malignant neoplasm of upper lobe, left bronchus or lung: Secondary | ICD-10-CM

## 2017-07-17 MED ORDER — HYDROCODONE-ACETAMINOPHEN 7.5-325 MG/15ML PO SOLN: 15.0000 mL | Freq: Four times a day (QID) | ORAL | 0 refills | Status: DC | PRN

## 2017-07-17 MED FILL — HYDROCOD-APAP 7.5-325/15ML: 7.5-325 | 8 days supply | Qty: 473 | Fill #0

## 2017-07-20 ENCOUNTER — Telehealth: Payer: Self-pay | Admitting: Internal Medicine

## 2017-07-20 ENCOUNTER — Other Ambulatory Visit (HOSPITAL_BASED_OUTPATIENT_CLINIC_OR_DEPARTMENT_OTHER): Payer: Managed Care, Other (non HMO)

## 2017-07-20 ENCOUNTER — Ambulatory Visit (HOSPITAL_BASED_OUTPATIENT_CLINIC_OR_DEPARTMENT_OTHER): Payer: Managed Care, Other (non HMO) | Admitting: Internal Medicine

## 2017-07-20 ENCOUNTER — Other Ambulatory Visit: Payer: Managed Care, Other (non HMO)

## 2017-07-20 ENCOUNTER — Ambulatory Visit
Admission: RE | Admit: 2017-07-20 | Discharge: 2017-07-20 | Disposition: A | Discharge status: Home / Self Care | Payer: Managed Care, Other (non HMO) | Source: Ambulatory Visit | Attending: Radiation Oncology | Admitting: Radiation Oncology

## 2017-07-20 ENCOUNTER — Encounter (INDEPENDENT_AMBULATORY_CARE_PROVIDER_SITE_OTHER): Payer: Self-pay

## 2017-07-20 ENCOUNTER — Ambulatory Visit: Payer: Managed Care, Other (non HMO)

## 2017-07-20 ENCOUNTER — Encounter: Payer: Self-pay | Admitting: Internal Medicine

## 2017-07-20 ENCOUNTER — Ambulatory Visit (HOSPITAL_BASED_OUTPATIENT_CLINIC_OR_DEPARTMENT_OTHER): Payer: Managed Care, Other (non HMO)

## 2017-07-20 VITALS — BP 110/62 | HR 85 | Temp 98.4°F | Resp 18 | Ht 70.0 in | Wt 163.0 lb

## 2017-07-20 DIAGNOSIS — C3412 Malignant neoplasm of upper lobe, left bronchus or lung: Secondary | ICD-10-CM

## 2017-07-20 DIAGNOSIS — C321 Malignant neoplasm of supraglottis: Secondary | ICD-10-CM

## 2017-07-20 DIAGNOSIS — Z5111 Encounter for antineoplastic chemotherapy: Secondary | ICD-10-CM | POA: Diagnosis not present

## 2017-07-20 DIAGNOSIS — Z510 Encounter for antineoplastic radiation therapy: Secondary | ICD-10-CM | POA: Diagnosis not present

## 2017-07-20 DIAGNOSIS — C3492 Malignant neoplasm of unspecified part of left bronchus or lung: Secondary | ICD-10-CM

## 2017-07-20 LAB — COMPREHENSIVE METABOLIC PANEL
ALT: 24 U/L (ref 0–55)
ANION GAP: 10 meq/L (ref 3–11)
AST: 13 U/L (ref 5–34)
Albumin: 2.9 g/dL — ABNORMAL LOW (ref 3.5–5.0)
Alkaline Phosphatase: 82 U/L (ref 40–150)
BILIRUBIN TOTAL: 0.32 mg/dL (ref 0.20–1.20)
BUN: 14.8 mg/dL (ref 7.0–26.0)
CALCIUM: 10.2 mg/dL (ref 8.4–10.4)
CO2: 27 meq/L (ref 22–29)
CREATININE: 0.7 mg/dL (ref 0.7–1.3)
Chloride: 102 mEq/L (ref 98–109)
EGFR: >90 mL/min/{1.73_m2} (ref >90)
Glucose: 114 mg/dl (ref 70–140)
Potassium: 4 mEq/L (ref 3.5–5.1)
Sodium: 139 mEq/L (ref 136–145)
TOTAL PROTEIN: 6.6 g/dL (ref 6.4–8.3)

## 2017-07-20 LAB — CBC WITH DIFFERENTIAL/PLATELET
BASO%: 0.1 % (ref 0.0–2.0)
Basophils Absolute: 0 10*3/uL (ref 0.0–0.1)
EOS%: 0.3 % (ref 0.0–7.0)
Eosinophils Absolute: 0 10*3/uL (ref 0.0–0.5)
HEMATOCRIT: 35.1 % — AB (ref 38.4–49.9)
HEMOGLOBIN: 11.8 g/dL — AB (ref 13.0–17.1)
LYMPH#: 0.5 10*3/uL — AB (ref 0.9–3.3)
LYMPH%: 6.6 % — ABNORMAL LOW (ref 14.0–49.0)
MCH: 32.4 pg (ref 27.2–33.4)
MCHC: 33.6 g/dL (ref 32.0–36.0)
MCV: 96.4 fL (ref 79.3–98.0)
MONO#: 0.7 10*3/uL (ref 0.1–0.9)
MONO%: 10.8 % (ref 0.0–14.0)
NEUT#: 5.7 10*3/uL (ref 1.5–6.5)
NEUT%: 82.2 % — ABNORMAL HIGH (ref 39.0–75.0)
PLATELETS: 292 10*3/uL (ref 140–400)
RBC: 3.64 10*6/uL — AB (ref 4.20–5.82)
RDW: 12.2 % (ref 11.0–14.6)
WBC: 6.9 10*3/uL (ref 4.0–10.3)

## 2017-07-20 MED ORDER — FAMOTIDINE IN NACL 20-0.9 MG/50ML-% IV SOLN
INTRAVENOUS | Status: AC
  Filled 2017-07-20: qty 50

## 2017-07-20 MED ORDER — SODIUM CHLORIDE 0.9 % IV SOLN
277.0000 mg | Freq: Once | INTRAVENOUS | Status: AC
  Administered 2017-07-20: 280 mg via INTRAVENOUS
  Filled 2017-07-20: qty 28

## 2017-07-20 MED ORDER — HEPARIN SOD (PORK) LOCK FLUSH 100 UNIT/ML IV SOLN
500.0000 [IU] | Freq: Once | INTRAVENOUS | Status: AC | PRN
  Administered 2017-07-20: 500 [IU]
  Filled 2017-07-20: qty 5

## 2017-07-20 MED ORDER — PALONOSETRON HCL INJECTION 0.25 MG/5ML
INTRAVENOUS | Status: AC
  Filled 2017-07-20: qty 5

## 2017-07-20 MED ORDER — SODIUM CHLORIDE 0.9% FLUSH
10.0000 mL | INTRAVENOUS | Status: DC | PRN
  Administered 2017-07-20: 10 mL
  Filled 2017-07-20: qty 10

## 2017-07-20 MED ORDER — PACLITAXEL CHEMO INJECTION 300 MG/50ML
45.0000 mg/m2 | Freq: Once | INTRAVENOUS | Status: AC
  Administered 2017-07-20: 84 mg via INTRAVENOUS
  Filled 2017-07-20: qty 14

## 2017-07-20 MED ORDER — DEXAMETHASONE SODIUM PHOSPHATE 10 MG/ML IJ SOLN
INTRAMUSCULAR | Status: AC
  Filled 2017-07-20: qty 1

## 2017-07-20 MED ORDER — SODIUM CHLORIDE 0.9 % IV SOLN
20.0000 mg | Freq: Once | INTRAVENOUS | Status: AC
  Administered 2017-07-20: 20 mg via INTRAVENOUS
  Filled 2017-07-20: qty 2

## 2017-07-20 MED ORDER — DIPHENHYDRAMINE HCL 50 MG/ML IJ SOLN
INTRAMUSCULAR | Status: AC
  Filled 2017-07-20: qty 1

## 2017-07-20 MED ORDER — DIPHENHYDRAMINE HCL 50 MG/ML IJ SOLN
50.0000 mg | Freq: Once | INTRAMUSCULAR | Status: AC
  Administered 2017-07-20: 50 mg via INTRAVENOUS

## 2017-07-20 MED ORDER — PALONOSETRON HCL INJECTION 0.25 MG/5ML
0.2500 mg | Freq: Once | INTRAVENOUS | Status: AC
  Administered 2017-07-20: 0.25 mg via INTRAVENOUS

## 2017-07-20 MED ORDER — SODIUM CHLORIDE 0.9 % IV SOLN
Freq: Once | INTRAVENOUS | Status: AC
  Administered 2017-07-20: 11:00:00 via INTRAVENOUS

## 2017-07-20 MED ORDER — SODIUM CHLORIDE 0.9 % IJ SOLN
10.0000 mL | Freq: Once | INTRAMUSCULAR | Status: AC
  Administered 2017-07-20: 10 mL via INTRAVENOUS
  Filled 2017-07-20: qty 10

## 2017-07-20 MED ORDER — LIDOCAINE VISCOUS 2 % MT SOLN: OROMUCOSAL | 1 refills | Status: DC

## 2017-07-20 MED ORDER — FAMOTIDINE IN NACL 20-0.9 MG/50ML-% IV SOLN
20.0000 mg | Freq: Once | INTRAVENOUS | Status: AC
  Administered 2017-07-20: 20 mg via INTRAVENOUS

## 2017-07-20 MED FILL — LIDOCAINE-PRILOCAINE CREAM: 2.5-2.5 | 15 days supply | Qty: 30 | Fill #1

## 2017-07-20 NOTE — Progress Notes (Signed)
[place] [phone]   [phone]  OFFICE PROGRESS NOTE  [name], [name], MD [place] [address]  DIAGNOSIS:  1) Stage IIB/IV (T3, N3, M1a) non-small cell lung cancer, squamous cell carcinoma presented with large left hilar mass in addition to mediastinal and left supraclavicular lymphadenopathy as well as contralateral right upper lobe nodule diagnosed in August 2018. PDL 1 expression: 90%. 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 First dose 06/29/2017. Status post 3 cycles.  INTERVAL HISTORY: KATHLEEN LIKINS 51 y.o. male returns to the clinic today for follow-up visit accompanied by his wife. The patient is feeling fine and tolerating his current treatment with concurrent chemoradiation fairly well except for the sore throat and odynophagia. He is currently on Carafate and pain medication. He denied having any chest pain, shortness of breath, cough or hemoptysis. He denied having any fever or chills. He has no nausea, vomiting, diarrhea or constipation. Resist for evaluation before starting cycle #4.   MEDICAL HISTORY: Past Medical History:  Diagnosis Date  . Anxiety   . COPD (chronic obstructive pulmonary disease) ([place])   . GERD (gastroesophageal reflux disease)   . Headache   . Hypertension   . Pneumonia   . Situational depression   . Stage III squamous cell carcinoma of left lung ([place]) 06/19/2017    ALLERGIES:  has No Known Allergies.  MEDICATIONS:  Current Outpatient Prescriptions  Medication Sig Dispense Refill  . BREO ELLIPTA 100-25 MCG/INH AEPB INHALE 1 PUFF INTO THE LUNGS DAILY 60 each 11  . Cholecalciferol (VITAMIN D PO) Take 1 tablet by mouth daily.    . clonazePAM (KLONOPIN) 0.5 MG tablet Take 1 tablet (0.5 mg total) by mouth 2 (two) times daily as  needed for anxiety. 180 tablet 0  . emollient (BIAFINE) cream Apply topically as needed.    Marland Kitchen esomeprazole (NEXIUM) 40 MG capsule TAKE 1 CAPSULE BY MOUTH DAILY WITH BREAKFAST 90 capsule 2  . HYDROcodone-acetaminophen (HYCET) 7.5-325 mg/15 ml solution Take 15 mLs by mouth 4 (four) times daily as needed for moderate pain. 473 mL 0  . lidocaine-prilocaine (EMLA) cream Apply 1 application topically as needed. 30 g 1  . loratadine (CLARITIN) 10 MG tablet Take 10 mg by mouth daily as needed for allergies.    . nicotine (NICODERM CQ) 21 mg/24hr patch Place 1 patch (21 mg total) onto the skin daily. 28 patch 0  . prochlorperazine (COMPAZINE) 10 MG tablet Take 1 tablet (10 mg total) by mouth every 6 (six) hours as needed for nausea or vomiting. 30 tablet 0  . sucralfate (CARAFATE) 1 g tablet Take 1 tablet (1 g total) by mouth 4 (four) times daily -  with meals and at bedtime. Crush and Mix in 1 oz water to make slurry 120 tablet 1  . acyclovir (ZOVIRAX) 400 MG tablet TAKE 1 TABLET (400 MG TOTAL) BY MOUTH 3 (THREE) TIMES DAILY. (Patient not taking: Reported on 07/06/2017) 21 tablet 3  . aspirin (ASPIRIN CHILDRENS) 81 MG chewable tablet Chew 1 tablet (81 mg total) by mouth daily. (Patient not taking: Reported on 07/20/2017) 100 tablet 11  . valsartan (DIOVAN) 160 MG tablet Take 1 tablet (160 mg total) by mouth daily. (Patient not taking: Reported on 07/20/2017) 30 tablet 11   No current facility-administered medications for this visit.  SURGICAL HISTORY:  Past Surgical History:  Procedure Laterality Date  . COLONOSCOPY    . DIRECT LARYNGOSCOPY N/A 06/25/2017   Procedure: DIRECT LARYNGOSCOPY AND BIOPSY;  Surgeon: Rozetta Nunnery, MD;  Location: [facility];  Service: ENT;  Laterality: N/A;  . EAR CYST EXCISION N/A 11/17/2013   Procedure: SEBACEOUS CYST CHEST;  Surgeon: Joyice Faster. Cornett, MD;  Location: [facility];  Service: General;  Laterality: N/A;  . KNEE ARTHROSCOPY       LEFT  . LIPOMA EXCISION N/A 11/17/2013   Procedure: EXCISION LIPOMA FOREHEAD;  Surgeon: Joyice Faster. Cornett, MD;  Location: [facility];  Service: General;  Laterality: N/A;  . LUNG BIOPSY Bilateral 06/12/2017   Procedure: LEFT LUNG BIOPSY;  Surgeon: Grace Isaac, MD;  Location: [facility];  Service: Thoracic;  Laterality: Bilateral;  . PORTACATH PLACEMENT Right 07/01/2017   Procedure: INSERTION PORT-A-CATH - RIGHT IJ - placed with Fluoro and Ultrasound;  Surgeon: Grace Isaac, MD;  Location: [facility];  Service: Thoracic;  Laterality: Right;  Marland Kitchen VIDEO BRONCHOSCOPY WITH ENDOBRONCHIAL ULTRASOUND N/A 06/12/2017   Procedure: VIDEO BRONCHOSCOPY WITH ENDOBRONCHIAL ULTRASOUND;  Surgeon: Grace Isaac, MD;  Location: [facility];  Service: Thoracic;  Laterality: N/A;    REVIEW OF SYSTEMS:  A comprehensive review of systems was negative except for: Constitutional: positive for fatigue Gastrointestinal: positive for odynophagia Musculoskeletal: positive for neck pain   PHYSICAL EXAMINATION: General appearance: alert, cooperative, fatigued and no distress Head: Normocephalic, without obvious abnormality, atraumatic Neck: no adenopathy, no JVD, supple, symmetrical, trachea midline and thyroid not enlarged, symmetric, no tenderness/mass/nodules Lymph nodes: Cervical, supraclavicular, and axillary nodes normal. Resp: clear to auscultation bilaterally Back: symmetric, no curvature. ROM normal. No CVA tenderness. Cardio: regular rate and rhythm, S1, S2 normal, no murmur, click, rub or gallop GI: soft, non-tender; bowel sounds normal; no masses,  no organomegaly Extremities: extremities normal, atraumatic, no cyanosis or edema  ECOG PERFORMANCE STATUS: 1 - Symptomatic but completely ambulatory  Blood pressure 110/62, pulse 85, temperature 98.4 F (36.9 C), temperature source Oral, resp. rate 18, height 5\' 10"  (1.778 m), weight 163 lb (73.9 kg), SpO2 93 %.  LABORATORY DATA: Lab Results   Component Value Date   WBC 6.9 07/20/2017   HGB 11.8 (L) 07/20/2017   HCT 35.1 (L) 07/20/2017   MCV 96.4 07/20/2017   PLT 292 07/20/2017      Chemistry      Component Value Date/Time   NA 139 07/20/2017 0916   K 4.0 07/20/2017 0916   CL 102 06/06/2017 1045   CO2 27 07/20/2017 0916   BUN 14.8 07/20/2017 0916   CREATININE 0.7 07/20/2017 0916      Component Value Date/Time   CALCIUM 10.2 07/20/2017 0916   ALKPHOS 82 07/20/2017 0916   AST 13 07/20/2017 0916   ALT 24 07/20/2017 0916   BILITOT 0.32 07/20/2017 0916       RADIOGRAPHIC STUDIES: Dg Chest 2 View  Addendum Date: 07/06/2017   ADDENDUM REPORT: 07/06/2017 10:03 ADDENDUM: Port-A-Cath tip is 3.8 cm below the carina just above the cavoatrial junction. Electronically Signed   By: Lorriane Shire M.D.   On: 07/06/2017 10:03   Result Date: 07/06/2017 CLINICAL DATA:  Port-A-Cath insertion. EXAM: CHEST  2 VIEW 2:38 p.m. COMPARISON:  07/01/2017 at 11:09 a.m. FINDINGS: R port appears in good position. No pneumothorax. Heart size and vascularity are normal. Fullness in the left hilum is consistent with the previously demonstrated mass. Small spiculated  nodule in the right upper lobe seen on the lateral view. Slight atelectasis in the lingula. IMPRESSION: Power port in good position.  No pneumothorax. Left hilar and mediastinal mass as demonstrated on prior PET-CT. Metastatic nodule in the right upper lobe. Lingular atelectasis. Electronically Signed: By: Lorriane Shire M.D. On: 07/01/2017 15:48   Dg Chest 2 View  Result Date: 07/01/2017 CLINICAL DATA:  Preop evaluation for upcoming port insertion, initial encounter EXAM: CHEST  2 VIEW COMPARISON:  06/12/2017 FINDINGS: Cardiac shadow is stable. The lungs are well aerated bilaterally. Stable chronic changes are noted in the lingula. The known left upper lobe nodule is not well appreciated. Some fullness in the left hilar region is noted consistent with the given clinical history of lung  carcinoma. No acute bony abnormality is seen. IMPRESSION: No change from previous exams. Electronically Signed   By: Inez Catalina M.D.   On: 07/01/2017 12:23   Dg Fluoro Guide Cv Line-no Report  Result Date: 07/01/2017 Fluoroscopy was utilized by the requesting physician.  No radiographic interpretation.    ASSESSMENT AND PLAN: This is a very pleasant 51 years old white male with recently diagnosed stage IIIB/IV non-small cell lung cancer, adenocarcinoma presented with large left hilar mass in addition to mediastinal and left supraclavicular lymphadenopathy as well as suspicious right upper lobe pulmonary nodule diagnosed in August 2018. 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 He is currently undergoing a course of concurrent chemoradiation to the lung as well as the epiglottic area under the care of Dr. Tammi Klippel. He is status post 3 cycle. He continues to tolerate the treatment fairly well except for the odynophagia, mouth sores and sore throat. I recommended for the patient to continue his current treatment with the same regimen and he will proceed with cycle #4 today. For the odynophagia and sore throat, I added Viscous Lidocaine to his regimen in addition to Carafate and pain medication. He would come back for follow-up visit in 2 weeks for evaluation before starting cycle #6. The patient was advised to call immediately if he has any concerning symptoms in the interval. The patient voices understanding of current disease status and treatment options and is in agreement with the current care plan. All questions were answered. The patient knows to call the clinic with any problems, questions or concerns. We can certainly see the patient much sooner if necessary. Disclaimer: This note was dictated with voice recognition software. Similar sounding words can inadvertently be transcribed and may not be corrected upon review.

## 2017-07-20 NOTE — Patient Instructions (Signed)
[facility] Discharge Instructions for Patients Receiving Chemotherapy  Today you received the following chemotherapy agents Taxol and Carboplatin. To help prevent nausea and vomiting after your treatment, we encourage you to take your nausea medication as directed.  If you develop nausea and vomiting that is not controlled by your nausea medication, call the clinic.   BELOW ARE SYMPTOMS THAT SHOULD BE REPORTED IMMEDIATELY:  *FEVER GREATER THAN 100.5 F  *CHILLS WITH OR WITHOUT FEVER  NAUSEA AND VOMITING THAT IS NOT CONTROLLED WITH YOUR NAUSEA MEDICATION  *UNUSUAL SHORTNESS OF BREATH  *UNUSUAL BRUISING OR BLEEDING  TENDERNESS IN MOUTH AND THROAT WITH OR WITHOUT PRESENCE OF ULCERS  *URINARY PROBLEMS  *BOWEL PROBLEMS  UNUSUAL RASH Items with * indicate a potential emergency and should be followed up as soon as possible.  Feel free to call the clinic you have any questions or concerns. The clinic phone number is [phone].  Please show the CHEMO ALERT CARD at check-in to the Emergency Department and triage nurse.    

## 2017-07-20 NOTE — Telephone Encounter (Signed)
Patient already scheduled for upcoming appointments.

## 2017-07-21 ENCOUNTER — Ambulatory Visit
Admission: RE | Admit: 2017-07-21 | Discharge: 2017-07-21 | Disposition: A | Discharge status: Home / Self Care | Payer: Managed Care, Other (non HMO) | Source: Ambulatory Visit | Attending: Radiation Oncology | Admitting: Radiation Oncology

## 2017-07-21 DIAGNOSIS — Z510 Encounter for antineoplastic radiation therapy: Secondary | ICD-10-CM | POA: Diagnosis not present

## 2017-07-21 MED FILL — NICOTINE 21 MG/24HR PATCH: 21 | 28 days supply | Qty: 28 | Fill #0

## 2017-07-22 ENCOUNTER — Ambulatory Visit
Admission: RE | Admit: 2017-07-22 | Discharge: 2017-07-22 | Disposition: A | Discharge status: Home / Self Care | Payer: Managed Care, Other (non HMO) | Source: Ambulatory Visit | Attending: Radiation Oncology | Admitting: Radiation Oncology

## 2017-07-22 DIAGNOSIS — Z510 Encounter for antineoplastic radiation therapy: Secondary | ICD-10-CM | POA: Diagnosis not present

## 2017-07-23 ENCOUNTER — Ambulatory Visit
Admission: RE | Admit: 2017-07-23 | Discharge: 2017-07-23 | Disposition: A | Discharge status: Home / Self Care | Payer: Managed Care, Other (non HMO) | Source: Ambulatory Visit | Attending: Radiation Oncology | Admitting: Radiation Oncology

## 2017-07-23 DIAGNOSIS — Z510 Encounter for antineoplastic radiation therapy: Secondary | ICD-10-CM | POA: Diagnosis not present

## 2017-07-24 ENCOUNTER — Ambulatory Visit
Admission: RE | Admit: 2017-07-24 | Discharge: 2017-07-24 | Disposition: A | Discharge status: Home / Self Care | Payer: Managed Care, Other (non HMO) | Source: Ambulatory Visit | Attending: Radiation Oncology | Admitting: Radiation Oncology

## 2017-07-24 DIAGNOSIS — Z510 Encounter for antineoplastic radiation therapy: Secondary | ICD-10-CM | POA: Diagnosis not present

## 2017-07-27 ENCOUNTER — Other Ambulatory Visit (HOSPITAL_BASED_OUTPATIENT_CLINIC_OR_DEPARTMENT_OTHER): Payer: Managed Care, Other (non HMO)

## 2017-07-27 ENCOUNTER — Other Ambulatory Visit: Payer: Self-pay | Admitting: Radiation Oncology

## 2017-07-27 ENCOUNTER — Ambulatory Visit: Payer: Managed Care, Other (non HMO)

## 2017-07-27 ENCOUNTER — Ambulatory Visit
Admission: RE | Admit: 2017-07-27 | Discharge: 2017-07-27 | Disposition: A | Discharge status: Home / Self Care | Payer: Managed Care, Other (non HMO) | Source: Ambulatory Visit | Attending: Radiation Oncology | Admitting: Radiation Oncology

## 2017-07-27 ENCOUNTER — Other Ambulatory Visit: Payer: Managed Care, Other (non HMO)

## 2017-07-27 ENCOUNTER — Ambulatory Visit (HOSPITAL_BASED_OUTPATIENT_CLINIC_OR_DEPARTMENT_OTHER): Payer: Managed Care, Other (non HMO)

## 2017-07-27 VITALS — BP 121/71 | HR 67 | Temp 98.5°F | Resp 20

## 2017-07-27 VITALS — BP 108/66 | HR 73 | Temp 98.7°F | Resp 20

## 2017-07-27 DIAGNOSIS — C3412 Malignant neoplasm of upper lobe, left bronchus or lung: Secondary | ICD-10-CM

## 2017-07-27 DIAGNOSIS — Z510 Encounter for antineoplastic radiation therapy: Secondary | ICD-10-CM | POA: Diagnosis not present

## 2017-07-27 DIAGNOSIS — C321 Malignant neoplasm of supraglottis: Secondary | ICD-10-CM

## 2017-07-27 DIAGNOSIS — Z95828 Presence of other vascular implants and grafts: Secondary | ICD-10-CM

## 2017-07-27 DIAGNOSIS — Z5111 Encounter for antineoplastic chemotherapy: Secondary | ICD-10-CM | POA: Diagnosis not present

## 2017-07-27 DIAGNOSIS — C3492 Malignant neoplasm of unspecified part of left bronchus or lung: Secondary | ICD-10-CM

## 2017-07-27 LAB — CBC WITH DIFFERENTIAL/PLATELET
BASO%: 0.2 % (ref 0.0–2.0)
Basophils Absolute: 0 10*3/uL (ref 0.0–0.1)
EOS%: 0.5 % (ref 0.0–7.0)
Eosinophils Absolute: 0 10*3/uL (ref 0.0–0.5)
HEMATOCRIT: 32.9 % — AB (ref 38.4–49.9)
HGB: 11.1 g/dL — ABNORMAL LOW (ref 13.0–17.1)
LYMPH#: 0.4 10*3/uL — AB (ref 0.9–3.3)
LYMPH%: 6.3 % — ABNORMAL LOW (ref 14.0–49.0)
MCH: 32.3 pg (ref 27.2–33.4)
MCHC: 33.7 g/dL (ref 32.0–36.0)
MCV: 95.6 fL (ref 79.3–98.0)
MONO#: 0.5 10*3/uL (ref 0.1–0.9)
MONO%: 8.9 % (ref 0.0–14.0)
NEUT#: 4.8 10*3/uL (ref 1.5–6.5)
NEUT%: 84.1 % — AB (ref 39.0–75.0)
Platelets: 218 10*3/uL (ref 140–400)
RBC: 3.44 10*6/uL — AB (ref 4.20–5.82)
RDW: 12.7 % (ref 11.0–14.6)
WBC: 5.7 10*3/uL (ref 4.0–10.3)

## 2017-07-27 LAB — COMPREHENSIVE METABOLIC PANEL
ALT: 21 U/L (ref 0–55)
ANION GAP: 8 meq/L (ref 3–11)
AST: 12 U/L (ref 5–34)
Albumin: 2.8 g/dL — ABNORMAL LOW (ref 3.5–5.0)
Alkaline Phosphatase: 83 U/L (ref 40–150)
BUN: 9.3 mg/dL (ref 7.0–26.0)
CALCIUM: 9.6 mg/dL (ref 8.4–10.4)
CO2: 29 mEq/L (ref 22–29)
Chloride: 103 mEq/L (ref 98–109)
Creatinine: 0.7 mg/dL (ref 0.7–1.3)
Glucose: 109 mg/dl (ref 70–140)
POTASSIUM: 3.6 meq/L (ref 3.5–5.1)
Sodium: 141 mEq/L (ref 136–145)
Total Bilirubin: 0.32 mg/dL (ref 0.20–1.20)
Total Protein: 6.1 g/dL — ABNORMAL LOW (ref 6.4–8.3)

## 2017-07-27 MED ORDER — PALONOSETRON HCL INJECTION 0.25 MG/5ML
0.2500 mg | Freq: Once | INTRAVENOUS | Status: AC
Start: 2017-07-27 — End: 2017-07-27
  Administered 2017-07-27: 0.25 mg via INTRAVENOUS

## 2017-07-27 MED ORDER — PACLITAXEL CHEMO INJECTION 300 MG/50ML
45.0000 mg/m2 | Freq: Once | INTRAVENOUS | Status: AC
  Administered 2017-07-27: 84 mg via INTRAVENOUS
  Filled 2017-07-27: qty 14

## 2017-07-27 MED ORDER — HEPARIN SOD (PORK) LOCK FLUSH 100 UNIT/ML IV SOLN
500.0000 [IU] | Freq: Once | INTRAVENOUS | Status: AC | PRN
  Administered 2017-07-27: 500 [IU]
  Filled 2017-07-27: qty 5

## 2017-07-27 MED ORDER — DIPHENHYDRAMINE HCL 50 MG/ML IJ SOLN
50.0000 mg | Freq: Once | INTRAMUSCULAR | Status: AC
  Administered 2017-07-27: 50 mg via INTRAVENOUS

## 2017-07-27 MED ORDER — SODIUM CHLORIDE 0.9% FLUSH
10.0000 mL | INTRAVENOUS | Status: DC | PRN
  Administered 2017-07-27: 10 mL via INTRAVENOUS
  Filled 2017-07-27: qty 10

## 2017-07-27 MED ORDER — FAMOTIDINE IN NACL 20-0.9 MG/50ML-% IV SOLN
20.0000 mg | Freq: Once | INTRAVENOUS | Status: AC
  Administered 2017-07-27: 20 mg via INTRAVENOUS

## 2017-07-27 MED ORDER — SODIUM CHLORIDE 0.9 % IV SOLN
20.0000 mg | Freq: Once | INTRAVENOUS | Status: AC
  Administered 2017-07-27: 20 mg via INTRAVENOUS
  Filled 2017-07-27: qty 2

## 2017-07-27 MED ORDER — GELCLAIR MT GEL: 1.0000 | Freq: Three times a day (TID) | OROMUCOSAL | 4 refills | Status: DC | PRN

## 2017-07-27 MED ORDER — SODIUM CHLORIDE 0.9 % IV SOLN
277.0000 mg | Freq: Once | INTRAVENOUS | Status: AC
  Administered 2017-07-27: 280 mg via INTRAVENOUS
  Filled 2017-07-27: qty 28

## 2017-07-27 MED ORDER — DIPHENHYDRAMINE HCL 50 MG/ML IJ SOLN
INTRAMUSCULAR | Status: AC
  Filled 2017-07-27: qty 1

## 2017-07-27 MED ORDER — OXYCODONE HCL 5 MG PO TABS: 5.0000 mg | ORAL_TABLET | ORAL | 0 refills | Status: DC | PRN

## 2017-07-27 MED ORDER — SODIUM CHLORIDE 0.9% FLUSH
10.0000 mL | INTRAVENOUS | Status: DC | PRN
  Administered 2017-07-27: 10 mL
  Filled 2017-07-27: qty 10

## 2017-07-27 MED ORDER — FAMOTIDINE IN NACL 20-0.9 MG/50ML-% IV SOLN
INTRAVENOUS | Status: AC
  Filled 2017-07-27: qty 50

## 2017-07-27 MED ORDER — PALONOSETRON HCL INJECTION 0.25 MG/5ML
INTRAVENOUS | Status: AC
  Filled 2017-07-27: qty 5

## 2017-07-27 MED ORDER — SODIUM CHLORIDE 0.9 % IV SOLN
Freq: Once | INTRAVENOUS | Status: AC
  Administered 2017-07-27: 10:00:00 via INTRAVENOUS

## 2017-07-27 MED FILL — oxyCODONE HCL 5 MG TABS: 5 | 10 days supply | Qty: 120 | Fill #0

## 2017-07-27 NOTE — Progress Notes (Addendum)
Patient reports having pain when swallowing.  He said he is taking hydrocodone liquid 4 times a day which burns.  He said it helps a little with the pain.  He is taking carafate 4 times a day. He also tried lidocaine but it almost made him throw up.  He is wondering if there is anything else that can be done to help him swallow.  He is eating only soup and ensure now.  He does have white spots present on both sides of his mouth.  BP 131/81 (BP Location: Left Arm, Patient Position: Sitting)   Pulse 71   Temp 98.1 F (36.7 C) (Oral)   SpO2 94%

## 2017-07-27 NOTE — Patient Instructions (Signed)
[facility] Discharge Instructions for Patients Receiving Chemotherapy  Today you received the following chemotherapy agents Taxol and Carboplatin. To help prevent nausea and vomiting after your treatment, we encourage you to take your nausea medication as directed.  If you develop nausea and vomiting that is not controlled by your nausea medication, call the clinic.   BELOW ARE SYMPTOMS THAT SHOULD BE REPORTED IMMEDIATELY:  *FEVER GREATER THAN 100.5 F  *CHILLS WITH OR WITHOUT FEVER  NAUSEA AND VOMITING THAT IS NOT CONTROLLED WITH YOUR NAUSEA MEDICATION  *UNUSUAL SHORTNESS OF BREATH  *UNUSUAL BRUISING OR BLEEDING  TENDERNESS IN MOUTH AND THROAT WITH OR WITHOUT PRESENCE OF ULCERS  *URINARY PROBLEMS  *BOWEL PROBLEMS  UNUSUAL RASH Items with * indicate a potential emergency and should be followed up as soon as possible.  Feel free to call the clinic you have any questions or concerns. The clinic phone number is [phone].  Please show the CHEMO ALERT CARD at check-in to the Emergency Department and triage nurse.    

## 2017-07-27 NOTE — Progress Notes (Signed)
Pt reports throat pain 9 on 0-10 pain scale (see flowsheets). MD [facility] notified. Per MD [name], no new orders at this time. Pt verbalizes understanding and agrees with plan of care.

## 2017-07-28 ENCOUNTER — Encounter: Payer: Self-pay | Admitting: Urology

## 2017-07-28 ENCOUNTER — Other Ambulatory Visit: Payer: Self-pay | Admitting: Urology

## 2017-07-28 ENCOUNTER — Ambulatory Visit
Admission: RE | Admit: 2017-07-28 | Discharge: 2017-07-28 | Disposition: A | Discharge status: Home / Self Care | Payer: Managed Care, Other (non HMO) | Source: Ambulatory Visit | Attending: Radiation Oncology | Admitting: Radiation Oncology

## 2017-07-28 DIAGNOSIS — Z510 Encounter for antineoplastic radiation therapy: Secondary | ICD-10-CM | POA: Diagnosis not present

## 2017-07-28 MED ORDER — FLUCONAZOLE 100 MG PO TABS: 100.0000 mg | ORAL_TABLET | Freq: Every day | ORAL | 0 refills | Status: DC

## 2017-07-28 MED FILL — FLUCONAZOLE 100 MG TABLET: 100 | 7 days supply | Qty: 8 | Fill #0

## 2017-07-28 NOTE — Progress Notes (Signed)
Patient seen today following treatment with complaints of persistent dysphagia.  He saw Bryson Ha 07/27/17 and was prescribed Gelclaire which he could not afford as this was going to cost $600 for a 1 week supply of medication.  I have confirmed with Bryson Ha that Guadelupe Sabin is working on Camera operator for this medication.  I informed patient that we are working on this and he is quite Patent attorney.  In the interim, I advised him to try using Mylanta OTC and he is in agreement.  I also noted some oral thrush on exam today so I think there is a possibility that he could have some esophageal candidiasis as well so I have sent a Rx for Diflucan which he will pick up and begin taking today.  He will continue using pain medications prn and we will keep him updated regarding progress with getting insurance approval for the Gelclaire. He is in agreement with the above stated plan and quite grateful for the time and care.   Nicholos Johns, PA-C

## 2017-07-29 ENCOUNTER — Ambulatory Visit
Admission: RE | Admit: 2017-07-29 | Discharge: 2017-07-29 | Disposition: A | Discharge status: Home / Self Care | Payer: Managed Care, Other (non HMO) | Source: Ambulatory Visit | Attending: Radiation Oncology | Admitting: Radiation Oncology

## 2017-07-29 DIAGNOSIS — Z510 Encounter for antineoplastic radiation therapy: Secondary | ICD-10-CM | POA: Diagnosis not present

## 2017-07-30 ENCOUNTER — Ambulatory Visit
Admission: RE | Admit: 2017-07-30 | Discharge: 2017-07-30 | Disposition: A | Discharge status: Home / Self Care | Payer: Managed Care, Other (non HMO) | Source: Ambulatory Visit | Attending: Radiation Oncology | Admitting: Radiation Oncology

## 2017-07-30 ENCOUNTER — Institutional Professional Consult (permissible substitution): Payer: Managed Care, Other (non HMO) | Admitting: Pulmonary Disease

## 2017-07-30 DIAGNOSIS — Z510 Encounter for antineoplastic radiation therapy: Secondary | ICD-10-CM | POA: Diagnosis not present

## 2017-07-31 ENCOUNTER — Ambulatory Visit
Admission: RE | Admit: 2017-07-31 | Discharge: 2017-07-31 | Disposition: A | Discharge status: Home / Self Care | Payer: Managed Care, Other (non HMO) | Source: Ambulatory Visit | Attending: Radiation Oncology | Admitting: Radiation Oncology

## 2017-07-31 DIAGNOSIS — Z510 Encounter for antineoplastic radiation therapy: Secondary | ICD-10-CM | POA: Diagnosis not present

## 2017-08-03 ENCOUNTER — Other Ambulatory Visit: Payer: Self-pay | Admitting: Radiation Oncology

## 2017-08-03 ENCOUNTER — Encounter: Payer: Self-pay | Admitting: Internal Medicine

## 2017-08-03 ENCOUNTER — Other Ambulatory Visit: Payer: Managed Care, Other (non HMO)

## 2017-08-03 ENCOUNTER — Encounter: Payer: Self-pay | Admitting: Radiation Oncology

## 2017-08-03 ENCOUNTER — Ambulatory Visit (HOSPITAL_BASED_OUTPATIENT_CLINIC_OR_DEPARTMENT_OTHER): Payer: Managed Care, Other (non HMO)

## 2017-08-03 ENCOUNTER — Ambulatory Visit
Admission: RE | Admit: 2017-08-03 | Discharge: 2017-08-03 | Disposition: A | Discharge status: Home / Self Care | Payer: Managed Care, Other (non HMO) | Source: Ambulatory Visit | Attending: Radiation Oncology | Admitting: Radiation Oncology

## 2017-08-03 ENCOUNTER — Encounter: Payer: Self-pay | Admitting: *Deleted

## 2017-08-03 ENCOUNTER — Other Ambulatory Visit (HOSPITAL_BASED_OUTPATIENT_CLINIC_OR_DEPARTMENT_OTHER): Payer: Managed Care, Other (non HMO)

## 2017-08-03 ENCOUNTER — Telehealth: Payer: Self-pay | Admitting: Internal Medicine

## 2017-08-03 ENCOUNTER — Ambulatory Visit: Payer: Managed Care, Other (non HMO)

## 2017-08-03 ENCOUNTER — Ambulatory Visit (HOSPITAL_BASED_OUTPATIENT_CLINIC_OR_DEPARTMENT_OTHER): Payer: Managed Care, Other (non HMO) | Admitting: Internal Medicine

## 2017-08-03 VITALS — BP 128/75 | HR 85 | Temp 98.3°F | Resp 17 | Ht 70.0 in | Wt 158.3 lb

## 2017-08-03 DIAGNOSIS — C321 Malignant neoplasm of supraglottis: Secondary | ICD-10-CM

## 2017-08-03 DIAGNOSIS — Z95828 Presence of other vascular implants and grafts: Secondary | ICD-10-CM | POA: Insufficient documentation

## 2017-08-03 DIAGNOSIS — C3492 Malignant neoplasm of unspecified part of left bronchus or lung: Secondary | ICD-10-CM

## 2017-08-03 DIAGNOSIS — K209 Esophagitis, unspecified without bleeding: Secondary | ICD-10-CM

## 2017-08-03 DIAGNOSIS — R1310 Dysphagia, unspecified: Secondary | ICD-10-CM

## 2017-08-03 DIAGNOSIS — C3412 Malignant neoplasm of upper lobe, left bronchus or lung: Secondary | ICD-10-CM

## 2017-08-03 DIAGNOSIS — Z5111 Encounter for antineoplastic chemotherapy: Secondary | ICD-10-CM

## 2017-08-03 DIAGNOSIS — Z510 Encounter for antineoplastic radiation therapy: Secondary | ICD-10-CM | POA: Diagnosis not present

## 2017-08-03 LAB — COMPREHENSIVE METABOLIC PANEL
ALT: 23 U/L (ref 0–55)
AST: 14 U/L (ref 5–34)
Albumin: 3 g/dL — ABNORMAL LOW (ref 3.5–5.0)
Alkaline Phosphatase: 69 U/L (ref 40–150)
Anion Gap: 9 mEq/L (ref 3–11)
BUN: 9.4 mg/dL (ref 7.0–26.0)
CHLORIDE: 98 meq/L (ref 98–109)
CO2: 30 meq/L — AB (ref 22–29)
CREATININE: 0.7 mg/dL (ref 0.7–1.3)
Calcium: 9.8 mg/dL (ref 8.4–10.4)
EGFR: >90 mL/min/{1.73_m2} (ref >90)
GLUCOSE: 145 mg/dL — AB (ref 70–140)
POTASSIUM: 4 meq/L (ref 3.5–5.1)
SODIUM: 136 meq/L (ref 136–145)
Total Bilirubin: 0.54 mg/dL (ref 0.20–1.20)
Total Protein: 6.6 g/dL (ref 6.4–8.3)

## 2017-08-03 LAB — CBC WITH DIFFERENTIAL/PLATELET
BASO%: 0 % (ref 0.0–2.0)
BASOS ABS: 0 10*3/uL (ref 0.0–0.1)
EOS%: 0 % (ref 0.0–7.0)
Eosinophils Absolute: 0 10*3/uL (ref 0.0–0.5)
HCT: 32.6 % — ABNORMAL LOW (ref 38.4–49.9)
HGB: 10.9 g/dL — ABNORMAL LOW (ref 13.0–17.1)
LYMPH#: 0.5 10*3/uL — AB (ref 0.9–3.3)
LYMPH%: 12.7 % — ABNORMAL LOW (ref 14.0–49.0)
MCH: 32.2 pg (ref 27.2–33.4)
MCHC: 33.4 g/dL (ref 32.0–36.0)
MCV: 96.2 fL (ref 79.3–98.0)
MONO#: 0.5 10*3/uL (ref 0.1–0.9)
MONO%: 14 % (ref 0.0–14.0)
NEUT#: 2.7 10*3/uL (ref 1.5–6.5)
NEUT%: 73.3 % (ref 39.0–75.0)
Platelets: 200 10*3/uL (ref 140–400)
RBC: 3.39 10*6/uL — AB (ref 4.20–5.82)
RDW: 13.2 % (ref 11.0–14.6)
WBC: 3.6 10*3/uL — ABNORMAL LOW (ref 4.0–10.3)

## 2017-08-03 MED ORDER — PALONOSETRON HCL INJECTION 0.25 MG/5ML
INTRAVENOUS | Status: AC
  Filled 2017-08-03: qty 5

## 2017-08-03 MED ORDER — PACLITAXEL CHEMO INJECTION 300 MG/50ML
45.0000 mg/m2 | Freq: Once | INTRAVENOUS | Status: AC
  Administered 2017-08-03: 84 mg via INTRAVENOUS
  Filled 2017-08-03: qty 14

## 2017-08-03 MED ORDER — SODIUM CHLORIDE 0.9% FLUSH
10.0000 mL | INTRAVENOUS | Status: DC | PRN
  Administered 2017-08-03: 10 mL
  Filled 2017-08-03: qty 10

## 2017-08-03 MED ORDER — PALONOSETRON HCL INJECTION 0.25 MG/5ML
0.2500 mg | Freq: Once | INTRAVENOUS | Status: AC
  Administered 2017-08-03: 0.25 mg via INTRAVENOUS

## 2017-08-03 MED ORDER — SODIUM CHLORIDE 0.9% FLUSH
10.0000 mL | Freq: Once | INTRAVENOUS | Status: AC
Start: 2017-08-03 — End: 2017-08-03
  Administered 2017-08-03: 10 mL
  Filled 2017-08-03: qty 10

## 2017-08-03 MED ORDER — HEPARIN SOD (PORK) LOCK FLUSH 100 UNIT/ML IV SOLN
500.0000 [IU] | Freq: Once | INTRAVENOUS | Status: AC | PRN
  Administered 2017-08-03: 500 [IU]
  Filled 2017-08-03: qty 5

## 2017-08-03 MED ORDER — DEXAMETHASONE SODIUM PHOSPHATE 100 MG/10ML IJ SOLN
20.0000 mg | Freq: Once | INTRAMUSCULAR | Status: AC
  Administered 2017-08-03: 20 mg via INTRAVENOUS
  Filled 2017-08-03: qty 2

## 2017-08-03 MED ORDER — MORPHINE SULFATE ER 15 MG PO TBEA: 15.0000 mg | EXTENDED_RELEASE_TABLET | Freq: Two times a day (BID) | ORAL | 0 refills | Status: DC

## 2017-08-03 MED ORDER — OXYCODONE HCL ER 30 MG PO T12A: 20.0000 mg | EXTENDED_RELEASE_TABLET | Freq: Two times a day (BID) | ORAL | 0 refills | Status: DC

## 2017-08-03 MED ORDER — DIPHENHYDRAMINE HCL 50 MG/ML IJ SOLN
INTRAMUSCULAR | Status: AC
  Filled 2017-08-03: qty 1

## 2017-08-03 MED ORDER — SODIUM CHLORIDE 0.9 % IV SOLN
Freq: Once | INTRAVENOUS | Status: AC
  Administered 2017-08-03: 10:00:00 via INTRAVENOUS

## 2017-08-03 MED ORDER — DIPHENHYDRAMINE HCL 50 MG/ML IJ SOLN
50.0000 mg | Freq: Once | INTRAMUSCULAR | Status: AC
  Administered 2017-08-03: 50 mg via INTRAVENOUS

## 2017-08-03 MED ORDER — FAMOTIDINE IN NACL 20-0.9 MG/50ML-% IV SOLN
20.0000 mg | Freq: Once | INTRAVENOUS | Status: AC
  Administered 2017-08-03: 20 mg via INTRAVENOUS

## 2017-08-03 MED ORDER — FAMOTIDINE IN NACL 20-0.9 MG/50ML-% IV SOLN
INTRAVENOUS | Status: AC
  Filled 2017-08-03: qty 50

## 2017-08-03 MED ORDER — SODIUM CHLORIDE 0.9 % IV SOLN
277.0000 mg | Freq: Once | INTRAVENOUS | Status: AC
  Administered 2017-08-03: 280 mg via INTRAVENOUS
  Filled 2017-08-03: qty 28

## 2017-08-03 MED FILL — MORPHINE SULF ER 15 MG TAB: 15 | 30 days supply | Qty: 60 | Fill #0

## 2017-08-03 NOTE — Progress Notes (Signed)
46 Spoke with customer service representative  at [facility] explained the prior authorization department has faxed cancellation forms stating unable to locate the member in their records and they are ineligible on our service. There have been six attempts to obtain prior authorization for gelclair gel.  The customer service representative spoke with the prior authorization department and connected me to a representative who reviewed Mr. Hnat record.  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 Servantes.  The  representative stated his insurance did not cover payment for gelclair gel.  I asked if the formulary had any medication to cover mucositis and the reply was none came up when she press the search button on her computer, I said not even mylanta  that's over the counter she re[lied no.  My reply was thank you for your time.

## 2017-08-03 NOTE — Progress Notes (Signed)
Late entry from 07/30/17 at 0956. Received patient in the clinic following radiation treatment requesting to speak with RN. Patient reports minimal relief of esophagitis from carafate, lidocaine and oxy IR. Patient wished to discussed potential side effects associated with Fentanyl. Explained he would be advised not to dry while using a Fentanyl patient. In addition, explained Fentanyl may cause drowsiness and constipation. Patient states, "I think I will just hold off on the Fentanyl patch for now and continue using the other stuff." Encouraged patient to inform staff if he changed his mind. Reinforced the importance of maintaining his weight during chemotherapy and radiation. Patient verbalized understanding of all reviewed.

## 2017-08-03 NOTE — Progress Notes (Signed)
Oncology Nurse Navigator Documentation  Oncology Nurse Navigator Flowsheets 08/03/2017  Navigator Location [facility]  Navigator Encounter Type Clinic/MDC/I spoke with patient and his wife today.  He is losing weight and I listened as he explained why.  He has not seen the dietitian yet.  I spoke with Dr. Julien Nordmann and he stated I can make referral. I just looked and patient is now scheduled for dietitian next week.    Patient Visit Type MedOnc  Treatment Phase Treatment  Barriers/Navigation Needs Coordination of Care  Interventions Coordination of Care  Coordination of Care Chemo  Acuity Level 1  Time Spent with Patient 30

## 2017-08-03 NOTE — Telephone Encounter (Signed)
Scheduled appt per 9/24 los - last treatment already scheduled - added f/u appt in 2 weeks - patient to get new schedule in the the treatment area.

## 2017-08-03 NOTE — Progress Notes (Signed)
[facility] [phone]   [phone]  OFFICE PROGRESS NOTE  [name], [name], MD [place] [address]  DIAGNOSIS:  1) Stage IIB/IV (T3, N3, M1a) non-small cell lung cancer, squamous cell carcinoma presented with large left hilar mass in addition to mediastinal and left supraclavicular lymphadenopathy as well as contralateral right upper lobe nodule diagnosed in August 2018. PDL 1 expression: 90%. 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 First dose 06/29/2017. Status post 5 cycles.  INTERVAL HISTORY: Trevor Zavala 52 y.o. male returns to the clinic today for follow-up visit accompanied by his wife. 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 He is currently on Carafate as well as oxycodone. He was tried on viscous lidocaine but did not get much benefit from it. He lost few pounds since his last visit. He denied having any chest pain but continues to have mild cough with no hemoptysis. He denied having any fever or chills. He has no nausea, vomiting, diarrhea or constipation. He is here today for evaluation before starting cycle #6.   MEDICAL HISTORY: Past Medical History:  Diagnosis Date  . Anxiety   . COPD (chronic obstructive pulmonary disease) ([place])   . GERD (gastroesophageal reflux disease)   . Headache   . Hypertension   . Pneumonia   . Situational depression   . Stage III squamous cell carcinoma of left lung ([place]) 06/19/2017    ALLERGIES:  has No Known Allergies.  MEDICATIONS:  Current Outpatient Prescriptions  Medication Sig Dispense Refill  . BREO ELLIPTA 100-25 MCG/INH AEPB INHALE 1 PUFF INTO THE LUNGS DAILY 60 each 11  . Cholecalciferol (VITAMIN D PO) Take 1 tablet by mouth daily.      . clonazePAM (KLONOPIN) 0.5 MG tablet Take 1 tablet (0.5 mg total) by mouth 2 (two) times daily as needed for anxiety. 180 tablet 0  . emollient (BIAFINE) cream Apply topically as needed.    Marland Kitchen esomeprazole (NEXIUM) 40 MG capsule TAKE 1 CAPSULE BY MOUTH DAILY WITH BREAKFAST 90 capsule 2  . lidocaine-prilocaine (EMLA) cream Apply 1 application topically as needed. 30 g 1  . loratadine (CLARITIN) 10 MG tablet Take 10 mg by mouth daily as needed for allergies.    . mucosal barrier oral (GELCLAIR) GEL Take 1 packet by mouth 3 (three) times daily as needed. 15 packet 4  . nicotine (NICODERM CQ) 21 mg/24hr patch Place 1 patch (21 mg total) onto the skin daily. 28 patch 0  . oxyCODONE (OXY IR/ROXICODONE) 5 MG immediate release tablet Take 1-2 tablets (5-10 mg total) by mouth every 4 (four) hours as needed for severe pain. smallest size pill possible 120 tablet 0  . sucralfate (CARAFATE) 1 g tablet Take 1 tablet (1 g total) by mouth 4 (four) times daily -  with meals and at bedtime. Crush and Mix in 1 oz water to make slurry 120 tablet 1  . acyclovir (ZOVIRAX) 400 MG tablet TAKE 1 TABLET (400 MG TOTAL) BY MOUTH 3 (THREE) TIMES DAILY. (Patient not taking: Reported on 07/06/2017) 21 tablet 3  . aspirin (ASPIRIN CHILDRENS) 81 MG chewable tablet Chew 1 tablet (81 mg total) by mouth daily. (Patient not taking: Reported on 07/20/2017) 100 tablet 11  . lidocaine (XYLOCAINE) 2 % solution  5 ml po q 8 hours as needed for mouth and throat pain (Patient not taking: Reported on 08/03/2017) 100 mL 1  . prochlorperazine (COMPAZINE) 10 MG tablet Take 1 tablet (10 mg total) by mouth every 6 (six) hours as needed for nausea or vomiting. (Patient not taking: Reported on 08/03/2017) 30 tablet 0  . valsartan (DIOVAN) 160 MG tablet Take 1 tablet (160 mg total) by mouth daily. (Patient not taking: Reported on 07/20/2017) 30 tablet 11   No current facility-administered medications for this visit.     SURGICAL HISTORY:  Past Surgical  History:  Procedure Laterality Date  . COLONOSCOPY    . DIRECT LARYNGOSCOPY N/A 06/25/2017   Procedure: DIRECT LARYNGOSCOPY AND BIOPSY;  Surgeon: Rozetta Nunnery, MD;  Location: [facility];  Service: ENT;  Laterality: N/A;  . EAR CYST EXCISION N/A 11/17/2013   Procedure: SEBACEOUS CYST CHEST;  Surgeon: Joyice Faster. Cornett, MD;  Location: [facility];  Service: General;  Laterality: N/A;  . KNEE ARTHROSCOPY     LEFT  . LIPOMA EXCISION N/A 11/17/2013   Procedure: EXCISION LIPOMA FOREHEAD;  Surgeon: Joyice Faster. Cornett, MD;  Location: [facility];  Service: General;  Laterality: N/A;  . LUNG BIOPSY Bilateral 06/12/2017   Procedure: LEFT LUNG BIOPSY;  Surgeon: Grace Isaac, MD;  Location: [facility];  Service: Thoracic;  Laterality: Bilateral;  . PORTACATH PLACEMENT Right 07/01/2017   Procedure: INSERTION PORT-A-CATH - RIGHT IJ - placed with Fluoro and Ultrasound;  Surgeon: Grace Isaac, MD;  Location: [facility];  Service: Thoracic;  Laterality: Right;  Marland Kitchen VIDEO BRONCHOSCOPY WITH ENDOBRONCHIAL ULTRASOUND N/A 06/12/2017   Procedure: VIDEO BRONCHOSCOPY WITH ENDOBRONCHIAL ULTRASOUND;  Surgeon: Grace Isaac, MD;  Location: [facility];  Service: Thoracic;  Laterality: N/A;    REVIEW OF SYSTEMS:  A comprehensive review of systems was negative except for: Constitutional: positive for fatigue and weight loss Gastrointestinal: positive for odynophagia   PHYSICAL EXAMINATION: General appearance: alert, cooperative, fatigued and no distress Head: Normocephalic, without obvious abnormality, atraumatic Neck: no adenopathy, no JVD, supple, symmetrical, trachea midline and thyroid not enlarged, symmetric, no tenderness/mass/nodules Lymph nodes: Cervical, supraclavicular, and axillary nodes normal. Resp: clear to auscultation bilaterally Back: symmetric, no curvature. ROM normal. No CVA tenderness. Cardio: regular rate and rhythm, S1, S2 normal, no murmur, click, rub or  gallop GI: soft, non-tender; bowel sounds normal; no masses,  no organomegaly Extremities: extremities normal, atraumatic, no cyanosis or edema  ECOG PERFORMANCE STATUS: 1 - Symptomatic but completely ambulatory  Blood pressure 128/75, pulse 85, temperature 98.3 F (36.8 C), temperature source Oral, resp. rate 17, height 5\' 10"  (1.778 m), weight 158 lb 4.8 oz (71.8 kg), SpO2 96 %.  LABORATORY DATA: Lab Results  Component Value Date   WBC 3.6 (L) 08/03/2017   HGB 10.9 (L) 08/03/2017   HCT 32.6 (L) 08/03/2017   MCV 96.2 08/03/2017   PLT 200 08/03/2017      Chemistry      Component Value Date/Time   NA 136 08/03/2017 0757   K 4.0 08/03/2017 0757   CL 102 06/06/2017 1045   CO2 30 (H) 08/03/2017 0757   BUN 9.4 08/03/2017 0757   CREATININE 0.7 08/03/2017 0757      Component Value Date/Time   CALCIUM 9.8 08/03/2017 0757   ALKPHOS 69 08/03/2017 0757   AST 14 08/03/2017 0757   ALT 23 08/03/2017 0757   BILITOT 0.54 08/03/2017 0757       RADIOGRAPHIC  STUDIES: No results found.  ASSESSMENT AND PLAN: This is a very pleasant 51 years old white male with recently diagnosed stage IIIB/IV non-small cell lung cancer, adenocarcinoma presented with large left hilar mass in addition to mediastinal and left supraclavicular lymphadenopathy as well as suspicious right upper lobe pulmonary nodule diagnosed in August 2018. 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 He is currently undergoing a course of concurrent chemoradiation to the lung as well as the epiglottic area under the care of Dr. Tammi Klippel. He is status post 5 cycle. He was awaiting the treatment well except for the sore throat and odynophagia. I recommended for the patient to proceed with cycle #6 today as scheduled. He is expected to complete this course of concurrent chemoradiation on 08/14/2017. For the odynophagia and sore throat, he will continue his current treatment with Carafate  and oxycodone. Dr. Tammi Klippel is also considering the patient for fentanyl patch. I will see him back for follow-up visit in 2 weeks for evaluation and management of any adverse effect of his treatment. He was advised to call immediately if he has any concerning symptoms in the interval. The patient voices understanding of current disease status and treatment options and is in agreement with the current care plan. All questions were answered. The patient knows to call the clinic with any problems, questions or concerns. We can certainly see the patient much sooner if necessary. Disclaimer: This note was dictated with voice recognition software. Similar sounding words can inadvertently be transcribed and may not be corrected upon review.

## 2017-08-03 NOTE — Patient Instructions (Signed)
Steps to Quit Smoking Smoking tobacco can be bad for your health. It can also affect almost every organ in your body. Smoking puts you and people around you at risk for many serious long-lasting (chronic) diseases. Quitting smoking is hard, but it is one of the best things that you can do for your health. It is never too late to quit. What are the benefits of quitting smoking? When you quit smoking, you lower your risk for getting serious diseases and conditions. They can include:  Lung cancer or lung disease.  Heart disease.  Stroke.  Heart attack.  Not being able to have children (infertility).  Weak bones (osteoporosis) and broken bones (fractures).  If you have coughing, wheezing, and shortness of breath, those symptoms may get better when you quit. You may also get sick less often. If you are pregnant, quitting smoking can help to lower your chances of having a baby of low birth weight. What can I do to help me quit smoking? Talk with your doctor about what can help you quit smoking. Some things you can do (strategies) include:  Quitting smoking totally, instead of slowly cutting back how much you smoke over a period of time.  Going to in-person counseling. You are more likely to quit if you go to many counseling sessions.  Using resources and support systems, such as: ? Online chats with a counselor. ? Phone quitlines. ? Printed self-help materials. ? Support groups or group counseling. ? Text messaging programs. ? Mobile phone apps or applications.  Taking medicines. Some of these medicines may have nicotine in them. If you are pregnant or breastfeeding, do not take any medicines to quit smoking unless your doctor says it is okay. Talk with your doctor about counseling or other things that can help you.  Talk with your doctor about using more than one strategy at the same time, such as taking medicines while you are also going to in-person counseling. This can help make  quitting easier. What things can I do to make it easier to quit? Quitting smoking might feel very hard at first, but there is a lot that you can do to make it easier. Take these steps:  Talk to your family and friends. Ask them to support and encourage you.  Call phone quitlines, reach out to support groups, or work with a counselor.  Ask people who smoke to not smoke around you.  Avoid places that make you want (trigger) to smoke, such as: ? Bars. ? Parties. ? Smoke-break areas at work.  Spend time with people who do not smoke.  Lower the stress in your life. Stress can make you want to smoke. Try these things to help your stress: ? Getting regular exercise. ? Deep-breathing exercises. ? Yoga. ? Meditating. ? Doing a body scan. To do this, close your eyes, focus on one area of your body at a time from head to toe, and notice which parts of your body are tense. Try to relax the muscles in those areas.  Download or buy apps on your mobile phone or tablet that can help you stick to your quit plan. There are many free apps, such as QuitGuide from the CDC (Centers for Disease Control and Prevention). You can find more support from smokefree.gov and other websites.  This information is not intended to replace advice given to you by your health care provider. Make sure you discuss any questions you have with your health care provider. Document Released: 08/23/2009 Document   Revised: 06/24/2016 Document Reviewed: 03/13/2015 Elsevier Interactive Patient Education  2018 [employer]vier Inc.  

## 2017-08-03 NOTE — Patient Instructions (Signed)
[facility] Discharge Instructions for Patients Receiving Chemotherapy  Today you received the following chemotherapy agents Taxol and Carboplatin. To help prevent nausea and vomiting after your treatment, we encourage you to take your nausea medication as directed.  If you develop nausea and vomiting that is not controlled by your nausea medication, call the clinic.   BELOW ARE SYMPTOMS THAT SHOULD BE REPORTED IMMEDIATELY:  *FEVER GREATER THAN 100.5 F  *CHILLS WITH OR WITHOUT FEVER  NAUSEA AND VOMITING THAT IS NOT CONTROLLED WITH YOUR NAUSEA MEDICATION  *UNUSUAL SHORTNESS OF BREATH  *UNUSUAL BRUISING OR BLEEDING  TENDERNESS IN MOUTH AND THROAT WITH OR WITHOUT PRESENCE OF ULCERS  *URINARY PROBLEMS  *BOWEL PROBLEMS  UNUSUAL RASH Items with * indicate a potential emergency and should be followed up as soon as possible.  Feel free to call the clinic you have any questions or concerns. The clinic phone number is [phone].  Please show the CHEMO ALERT CARD at check-in to the Emergency Department and triage nurse.    

## 2017-08-04 ENCOUNTER — Ambulatory Visit
Admission: RE | Admit: 2017-08-04 | Discharge: 2017-08-04 | Disposition: A | Discharge status: Home / Self Care | Payer: Managed Care, Other (non HMO) | Source: Ambulatory Visit | Attending: Radiation Oncology | Admitting: Radiation Oncology

## 2017-08-04 ENCOUNTER — Other Ambulatory Visit: Payer: Self-pay | Admitting: Radiation Oncology

## 2017-08-04 ENCOUNTER — Encounter: Payer: Self-pay | Admitting: *Deleted

## 2017-08-04 DIAGNOSIS — K209 Esophagitis, unspecified without bleeding: Secondary | ICD-10-CM

## 2017-08-04 DIAGNOSIS — C321 Malignant neoplasm of supraglottis: Secondary | ICD-10-CM

## 2017-08-04 DIAGNOSIS — Z510 Encounter for antineoplastic radiation therapy: Secondary | ICD-10-CM | POA: Diagnosis not present

## 2017-08-04 MED ORDER — VITAMIN E 15 UNIT/0.3ML PO SOLN: 50.0000 [IU] | Freq: Three times a day (TID) | ORAL | 2 refills | Status: DC | PRN

## 2017-08-04 MED FILL — VITAMIN E 50 UNIT/ML DROPS: 15 | 10 days supply | Qty: 30 | Fill #0

## 2017-08-04 NOTE — Progress Notes (Signed)
Dare with Trevor Zavala about calling [name] again on yesterday to give an update.  [employer]'s  prior authorization department will not pay for the medication gelclair or mylanta. I told him that Trevor Zavala is aware an is searching for another medication to use. 1122 Trevor Zavala asked that I let Trevor Zavala know his pain medication was not helping much that he started on yesterday his pain is still 7/10.  Vitamin E and it is okay with Trevor Zavala to try the medication. Trevor Zavala, P.A. made aware of the above and asked that he continue to take the medication as ordered for pain control for a few more days. Trevor Zavala Trevor Zavala back and gave message to continue the pain medication and he plans to continue the medication.

## 2017-08-05 ENCOUNTER — Ambulatory Visit
Admission: RE | Admit: 2017-08-05 | Discharge: 2017-08-05 | Disposition: A | Discharge status: Home / Self Care | Payer: Managed Care, Other (non HMO) | Source: Ambulatory Visit | Attending: Radiation Oncology | Admitting: Radiation Oncology

## 2017-08-05 DIAGNOSIS — Z510 Encounter for antineoplastic radiation therapy: Secondary | ICD-10-CM | POA: Diagnosis not present

## 2017-08-06 ENCOUNTER — Other Ambulatory Visit: Payer: Self-pay | Admitting: *Deleted

## 2017-08-06 ENCOUNTER — Telehealth: Payer: Self-pay

## 2017-08-06 ENCOUNTER — Other Ambulatory Visit: Payer: Self-pay | Admitting: Radiation Oncology

## 2017-08-06 ENCOUNTER — Ambulatory Visit: Admission: RE | Admit: 2017-08-06 | Payer: Managed Care, Other (non HMO) | Source: Ambulatory Visit

## 2017-08-06 ENCOUNTER — Telehealth: Payer: Self-pay | Admitting: *Deleted

## 2017-08-06 ENCOUNTER — Ambulatory Visit (HOSPITAL_BASED_OUTPATIENT_CLINIC_OR_DEPARTMENT_OTHER): Payer: Managed Care, Other (non HMO)

## 2017-08-06 VITALS — BP 114/71 | HR 65 | Temp 97.5°F | Resp 17 | Ht 70.0 in | Wt 151.9 lb

## 2017-08-06 DIAGNOSIS — C321 Malignant neoplasm of supraglottis: Secondary | ICD-10-CM | POA: Diagnosis not present

## 2017-08-06 DIAGNOSIS — C3412 Malignant neoplasm of upper lobe, left bronchus or lung: Secondary | ICD-10-CM

## 2017-08-06 DIAGNOSIS — Z95828 Presence of other vascular implants and grafts: Secondary | ICD-10-CM

## 2017-08-06 MED ORDER — MORPHINE SULFATE 4 MG/ML IJ SOLN
2.0000 mg | Freq: Once | INTRAMUSCULAR | Status: AC
  Administered 2017-08-06: 2 mg via INTRAVENOUS
  Filled 2017-08-06: qty 1

## 2017-08-06 MED ORDER — SODIUM CHLORIDE 0.9% FLUSH
10.0000 mL | Freq: Once | INTRAVENOUS | Status: AC
  Administered 2017-08-06: 10 mL via INTRAVENOUS
  Filled 2017-08-06: qty 10

## 2017-08-06 MED ORDER — HEPARIN SOD (PORK) LOCK FLUSH 100 UNIT/ML IV SOLN
500.0000 [IU] | Freq: Once | INTRAVENOUS | Status: AC
  Administered 2017-08-06: 500 [IU] via INTRAVENOUS
  Filled 2017-08-06: qty 5

## 2017-08-06 MED ORDER — MORPHINE SULFATE (PF) 4 MG/ML IV SOLN
INTRAVENOUS | Status: AC
  Filled 2017-08-06: qty 1

## 2017-08-06 MED ORDER — SODIUM CHLORIDE 0.9% FLUSH
10.0000 mL | Freq: Once | INTRAVENOUS | Status: AC
  Administered 2017-08-06: 10 mL
  Filled 2017-08-06: qty 10

## 2017-08-06 MED ORDER — SODIUM CHLORIDE 0.9 % IV SOLN
Freq: Once | INTRAVENOUS | Status: AC
  Administered 2017-08-06: 10:00:00 via INTRAVENOUS

## 2017-08-06 MED ORDER — FENTANYL 25 MCG/HR TD PT72: 25.0000 ug | MEDICATED_PATCH | TRANSDERMAL | 0 refills | Status: DC

## 2017-08-06 MED FILL — fentaNYL 25 MCG/HR PT72: 25 | 15 days supply | Qty: 5 | Fill #0

## 2017-08-06 NOTE — Telephone Encounter (Signed)
Added patient on for [facility] inf. Today. Per 9/27 sch message

## 2017-08-06 NOTE — Progress Notes (Signed)
RN visit only for IV fluids. 

## 2017-08-06 NOTE — Telephone Encounter (Signed)
CALLED PATIENT TO INFORM  OF G TUBE PLACEMENT ON 08-12-17 - ARRIVAL TIME - 12:30 PM @ [facility], PT. TO PICK-UP BARIUM BY 08-11-17, LVM FOR A RETURN CALL (LVM FOR HIS WIFE CINDY) PER PATIENT REQUEST

## 2017-08-06 NOTE — Patient Instructions (Signed)
Dehydration, Adult Dehydration is a condition in which there is not enough fluid or water in the body. This happens when you lose more fluids than you take in. Important organs, such as the kidneys, brain, and heart, cannot function without a proper amount of fluids. Any loss of fluids from the body can lead to dehydration. Dehydration can range from mild to severe. This condition should be treated right away to prevent it from becoming severe. What are the causes? This condition may be caused by:  Vomiting.  Diarrhea.  Excessive sweating, such as from heat exposure or exercise.  Not drinking enough fluid, especially: ? When ill. ? While doing activity that requires a lot of energy.  Excessive urination.  Fever.  Infection.  Certain medicines, such as medicines that cause the body to lose excess fluid (diuretics).  Inability to access safe drinking water.  Reduced physical ability to get adequate water and food.  What increases the risk? This condition is more likely to develop in people:  Who have a poorly controlled long-term (chronic) illness, such as diabetes, heart disease, or kidney disease.  Who are age 65 or older.  Who are disabled.  Who live in a place with high altitude.  Who play endurance sports.  What are the signs or symptoms? Symptoms of mild dehydration may include:  Thirst.  Dry lips.  Slightly dry mouth.  Dry, warm skin.  Dizziness. Symptoms of moderate dehydration may include:  Very dry mouth.  Muscle cramps.  Dark urine. Urine may be the color of tea.  Decreased urine production.  Decreased tear production.  Heartbeat that is irregular or faster than normal (palpitations).  Headache.  Light-headedness, especially when you stand up from a sitting position.  Fainting (syncope). Symptoms of severe dehydration may include:  Changes in skin, such as: ? Cold and clammy skin. ? Blotchy (mottled) or pale skin. ? Skin that does  not quickly return to normal after being lightly pinched and released (poor skin turgor).  Changes in body fluids, such as: ? Extreme thirst. ? No tear production. ? Inability to sweat when body temperature is high, such as in hot weather. ? Very little urine production.  Changes in vital signs, such as: ? Weak pulse. ? Pulse that is more than 100 beats a minute when sitting still. ? Rapid breathing. ? Low blood pressure.  Other changes, such as: ? Sunken eyes. ? Cold hands and feet. ? Confusion. ? Lack of energy (lethargy). ? Difficulty waking up from sleep. ? Short-term weight loss. ? Unconsciousness. How is this diagnosed? This condition is diagnosed based on your symptoms and a physical exam. Blood and urine tests may be done to help confirm the diagnosis. How is this treated? Treatment for this condition depends on the severity. Mild or moderate dehydration can often be treated at home. Treatment should be started right away. Do not wait until dehydration becomes severe. Severe dehydration is an emergency and it needs to be treated in a hospital. Treatment for mild dehydration may include:  Drinking more fluids.  Replacing salts and minerals in your blood (electrolytes) that you may have lost. Treatment for moderate dehydration may include:  Drinking an oral rehydration solution (ORS). This is a drink that helps you replace fluids and electrolytes (rehydrate). It can be found at pharmacies and retail stores. Treatment for severe dehydration may include:  Receiving fluids through an IV tube.  Receiving an electrolyte solution through a feeding tube that is passed through your nose   and into your stomach (nasogastric tube, or NG tube).  Correcting any abnormalities in electrolytes.  Treating the underlying cause of dehydration. Follow these instructions at home:  If directed by your health care provider, drink an ORS: ? Make an ORS by following instructions on the  package. ? Start by drinking small amounts, about  cup (120 mL) every 5-10 minutes. ? Slowly increase how much you drink until you have taken the amount recommended by your health care provider.  Drink enough clear fluid to keep your urine clear or pale yellow. If you were told to drink an ORS, finish the ORS first, then start slowly drinking other clear fluids. Drink fluids such as: ? Water. Do not drink only water. Doing that can lead to having too little salt (sodium) in the body (hyponatremia). ? Ice chips. ? Fruit juice that you have added water to (diluted fruit juice). ? Low-calorie sports drinks.  Avoid: ? Alcohol. ? Drinks that contain a lot of sugar. These include high-calorie sports drinks, fruit juice that is not diluted, and soda. ? Caffeine. ? Foods that are greasy or contain a lot of fat or sugar.  Take over-the-counter and prescription medicines only as told by your health care provider.  Do not take sodium tablets. This can lead to having too much sodium in the body (hypernatremia).  Eat foods that contain a healthy balance of electrolytes, such as bananas, oranges, potatoes, tomatoes, and spinach.  Keep all follow-up visits as told by your health care provider. This is important. Contact a health care provider if:  You have abdominal pain that: ? Gets worse. ? Stays in one area (localizes).  You have a rash.  You have a stiff neck.  You are more irritable than usual.  You are sleepier or more difficult to wake up than usual.  You feel weak or dizzy.  You feel very thirsty.  You have urinated only a small amount of very dark urine over 6-8 hours. Get help right away if:  You have symptoms of severe dehydration.  You cannot drink fluids without vomiting.  Your symptoms get worse with treatment.  You have a fever.  You have a severe headache.  You have vomiting or diarrhea that: ? Gets worse. ? Does not go away.  You have blood or green matter  (bile) in your vomit.  You have blood in your stool. This may cause stool to look black and tarry.  You have not urinated in 6-8 hours.  You faint.  Your heart rate while sitting still is over 100 beats a minute.  You have trouble breathing. This information is not intended to replace advice given to you by your health care provider. Make sure you discuss any questions you have with your health care provider. Document Released: 10/27/2005 Document Revised: 05/23/2016 Document Reviewed: 12/21/2015 Elsevier Interactive Patient Education  2018 [employer]  

## 2017-08-07 ENCOUNTER — Ambulatory Visit: Payer: Managed Care, Other (non HMO)

## 2017-08-07 ENCOUNTER — Encounter (HOSPITAL_COMMUNITY): Payer: Self-pay

## 2017-08-07 ENCOUNTER — Emergency Department (HOSPITAL_COMMUNITY): Payer: Managed Care, Other (non HMO)

## 2017-08-07 ENCOUNTER — Other Ambulatory Visit: Payer: Self-pay

## 2017-08-07 ENCOUNTER — Telehealth: Payer: Self-pay

## 2017-08-07 ENCOUNTER — Inpatient Hospital Stay (HOSPITAL_COMMUNITY)
Admission: EM | Admit: 2017-08-07 | Discharge: 2017-08-17 | DRG: 146 | Disposition: A | Discharge status: Home Health | Payer: Managed Care, Other (non HMO) | Attending: Family Medicine | Admitting: Family Medicine

## 2017-08-07 DIAGNOSIS — R638 Other symptoms and signs concerning food and fluid intake: Secondary | ICD-10-CM

## 2017-08-07 DIAGNOSIS — Z808 Family history of malignant neoplasm of other organs or systems: Secondary | ICD-10-CM

## 2017-08-07 DIAGNOSIS — Y842 Radiological procedure and radiotherapy as the cause of abnormal reaction of the patient, or of later complication, without mention of misadventure at the time of the procedure: Secondary | ICD-10-CM | POA: Diagnosis present

## 2017-08-07 DIAGNOSIS — F17200 Nicotine dependence, unspecified, uncomplicated: Secondary | ICD-10-CM | POA: Diagnosis not present

## 2017-08-07 DIAGNOSIS — K210 Gastro-esophageal reflux disease with esophagitis: Secondary | ICD-10-CM | POA: Diagnosis present

## 2017-08-07 DIAGNOSIS — C3412 Malignant neoplasm of upper lobe, left bronchus or lung: Secondary | ICD-10-CM | POA: Diagnosis not present

## 2017-08-07 DIAGNOSIS — J449 Chronic obstructive pulmonary disease, unspecified: Secondary | ICD-10-CM | POA: Diagnosis not present

## 2017-08-07 DIAGNOSIS — E43 Unspecified severe protein-calorie malnutrition: Secondary | ICD-10-CM | POA: Diagnosis not present

## 2017-08-07 DIAGNOSIS — Z7951 Long term (current) use of inhaled steroids: Secondary | ICD-10-CM

## 2017-08-07 DIAGNOSIS — E860 Dehydration: Secondary | ICD-10-CM | POA: Diagnosis not present

## 2017-08-07 DIAGNOSIS — G893 Neoplasm related pain (acute) (chronic): Secondary | ICD-10-CM | POA: Diagnosis present

## 2017-08-07 DIAGNOSIS — K5900 Constipation, unspecified: Secondary | ICD-10-CM | POA: Diagnosis present

## 2017-08-07 DIAGNOSIS — R1310 Dysphagia, unspecified: Secondary | ICD-10-CM | POA: Diagnosis not present

## 2017-08-07 DIAGNOSIS — Z79899 Other long term (current) drug therapy: Secondary | ICD-10-CM

## 2017-08-07 DIAGNOSIS — R739 Hyperglycemia, unspecified: Secondary | ICD-10-CM | POA: Diagnosis present

## 2017-08-07 DIAGNOSIS — R0902 Hypoxemia: Secondary | ICD-10-CM

## 2017-08-07 DIAGNOSIS — F17210 Nicotine dependence, cigarettes, uncomplicated: Secondary | ICD-10-CM | POA: Diagnosis present

## 2017-08-07 DIAGNOSIS — E440 Moderate protein-calorie malnutrition: Secondary | ICD-10-CM | POA: Diagnosis present

## 2017-08-07 DIAGNOSIS — C3411 Malignant neoplasm of upper lobe, right bronchus or lung: Secondary | ICD-10-CM | POA: Diagnosis present

## 2017-08-07 DIAGNOSIS — J9601 Acute respiratory failure with hypoxia: Secondary | ICD-10-CM

## 2017-08-07 DIAGNOSIS — F4321 Adjustment disorder with depressed mood: Secondary | ICD-10-CM | POA: Diagnosis present

## 2017-08-07 DIAGNOSIS — J440 Chronic obstructive pulmonary disease with acute lower respiratory infection: Secondary | ICD-10-CM | POA: Diagnosis present

## 2017-08-07 DIAGNOSIS — J189 Pneumonia, unspecified organism: Secondary | ICD-10-CM | POA: Diagnosis present

## 2017-08-07 DIAGNOSIS — Z5111 Encounter for antineoplastic chemotherapy: Secondary | ICD-10-CM

## 2017-08-07 DIAGNOSIS — Z8249 Family history of ischemic heart disease and other diseases of the circulatory system: Secondary | ICD-10-CM

## 2017-08-07 DIAGNOSIS — D72819 Decreased white blood cell count, unspecified: Secondary | ICD-10-CM | POA: Diagnosis present

## 2017-08-07 DIAGNOSIS — Z79891 Long term (current) use of opiate analgesic: Secondary | ICD-10-CM

## 2017-08-07 DIAGNOSIS — J9621 Acute and chronic respiratory failure with hypoxia: Secondary | ICD-10-CM | POA: Diagnosis present

## 2017-08-07 DIAGNOSIS — K219 Gastro-esophageal reflux disease without esophagitis: Secondary | ICD-10-CM | POA: Diagnosis not present

## 2017-08-07 DIAGNOSIS — F329 Major depressive disorder, single episode, unspecified: Secondary | ICD-10-CM | POA: Diagnosis present

## 2017-08-07 DIAGNOSIS — I10 Essential (primary) hypertension: Secondary | ICD-10-CM | POA: Diagnosis not present

## 2017-08-07 DIAGNOSIS — R1319 Other dysphagia: Secondary | ICD-10-CM | POA: Diagnosis not present

## 2017-08-07 DIAGNOSIS — R0602 Shortness of breath: Secondary | ICD-10-CM | POA: Diagnosis present

## 2017-08-07 DIAGNOSIS — R1312 Dysphagia, oropharyngeal phase: Secondary | ICD-10-CM | POA: Diagnosis not present

## 2017-08-07 DIAGNOSIS — E876 Hypokalemia: Secondary | ICD-10-CM | POA: Diagnosis not present

## 2017-08-07 DIAGNOSIS — F411 Generalized anxiety disorder: Secondary | ICD-10-CM | POA: Diagnosis present

## 2017-08-07 DIAGNOSIS — Z781 Physical restraint status: Secondary | ICD-10-CM

## 2017-08-07 DIAGNOSIS — C321 Malignant neoplasm of supraglottis: Principal | ICD-10-CM | POA: Diagnosis present

## 2017-08-07 DIAGNOSIS — Z800 Family history of malignant neoplasm of digestive organs: Secondary | ICD-10-CM

## 2017-08-07 DIAGNOSIS — Z807 Family history of other malignant neoplasms of lymphoid, hematopoietic and related tissues: Secondary | ICD-10-CM

## 2017-08-07 DIAGNOSIS — T380X5A Adverse effect of glucocorticoids and synthetic analogues, initial encounter: Secondary | ICD-10-CM | POA: Diagnosis present

## 2017-08-07 DIAGNOSIS — Z803 Family history of malignant neoplasm of breast: Secondary | ICD-10-CM

## 2017-08-07 LAB — CBC WITH DIFFERENTIAL/PLATELET
Basophils Absolute: 0 10*3/uL (ref 0.0–0.1)
Basophils Relative: 0 %
EOS PCT: 1 %
Eosinophils Absolute: 0 10*3/uL (ref 0.0–0.7)
HCT: 33.1 % — ABNORMAL LOW (ref 39.0–52.0)
Hemoglobin: 11.3 g/dL — ABNORMAL LOW (ref 13.0–17.0)
LYMPHS ABS: 0.3 10*3/uL — AB (ref 0.7–4.0)
Lymphocytes Relative: 14 %
MCH: 32 pg (ref 26.0–34.0)
MCHC: 34.1 g/dL (ref 30.0–36.0)
MCV: 93.8 fL (ref 78.0–100.0)
Monocytes Absolute: 0.1 10*3/uL (ref 0.1–1.0)
Monocytes Relative: 4 %
Neutro Abs: 2 10*3/uL (ref 1.7–7.7)
Neutrophils Relative %: 81 %
PLATELETS: 184 10*3/uL (ref 150–400)
RBC: 3.53 MIL/uL — AB (ref 4.22–5.81)
RDW: 13.5 % (ref 11.5–15.5)
WBC: 2.4 10*3/uL — AB (ref 4.0–10.5)

## 2017-08-07 LAB — COMPREHENSIVE METABOLIC PANEL
ALK PHOS: 58 U/L (ref 38–126)
ALT: 43 U/L (ref 17–63)
AST: 20 U/L (ref 15–41)
Albumin: 3.1 g/dL — ABNORMAL LOW (ref 3.5–5.0)
Anion gap: 10 (ref 5–15)
BUN: 13 mg/dL (ref 6–20)
CALCIUM: 9 mg/dL (ref 8.9–10.3)
CHLORIDE: 104 mmol/L (ref 101–111)
CO2: 26 mmol/L (ref 22–32)
CREATININE: 0.49 mg/dL — AB (ref 0.61–1.24)
Glucose, Bld: 87 mg/dL (ref 65–99)
Potassium: 3.2 mmol/L — ABNORMAL LOW (ref 3.5–5.1)
Sodium: 140 mmol/L (ref 135–145)
TOTAL PROTEIN: 6.1 g/dL — AB (ref 6.5–8.1)
Total Bilirubin: 1 mg/dL (ref 0.3–1.2)

## 2017-08-07 LAB — I-STAT CG4 LACTIC ACID, ED: LACTIC ACID, VENOUS: 0.49 mmol/L — AB (ref 0.5–1.9)

## 2017-08-07 LAB — APTT: aPTT: 31 seconds (ref 24–36)

## 2017-08-07 LAB — PROTIME-INR
INR: 1.13
PROTHROMBIN TIME: 14.4 s (ref 11.4–15.2)

## 2017-08-07 MED ORDER — ACETAMINOPHEN 325 MG PO TABS: 650.0000 mg | ORAL_TABLET | Freq: Four times a day (QID) | ORAL | Status: DC | PRN

## 2017-08-07 MED ORDER — PRUTECT EX EMUL
CUTANEOUS | Status: DC | PRN
Start: 2017-08-07 — End: 2017-08-17
  Filled 2017-08-07: qty 45

## 2017-08-07 MED ORDER — HEPARIN SOD (PORK) LOCK FLUSH 100 UNIT/ML IV SOLN: 500.0000 [IU] | Freq: Once | INTRAVENOUS | Status: DC

## 2017-08-07 MED ORDER — SODIUM CHLORIDE 0.9 % IV SOLN
INTRAVENOUS | Status: DC
  Administered 2017-08-08 – 2017-08-10 (×5): via INTRAVENOUS

## 2017-08-07 MED ORDER — IPRATROPIUM-ALBUTEROL 0.5-2.5 (3) MG/3ML IN SOLN
3.0000 mL | Freq: Once | RESPIRATORY_TRACT | Status: AC
  Administered 2017-08-07: 3 mL via RESPIRATORY_TRACT
  Filled 2017-08-07: qty 3

## 2017-08-07 MED ORDER — FLUTICASONE FUROATE-VILANTEROL 100-25 MCG/INH IN AEPB
1.0000 | INHALATION_SPRAY | Freq: Every day | RESPIRATORY_TRACT | Status: DC
  Administered 2017-08-08 – 2017-08-17 (×4): 1 via RESPIRATORY_TRACT
  Filled 2017-08-07: qty 28

## 2017-08-07 MED ORDER — OXYCODONE HCL 5 MG PO TABS: 5.0000 mg | ORAL_TABLET | ORAL | Status: DC | PRN

## 2017-08-07 MED ORDER — SODIUM CHLORIDE 0.9 % IV BOLUS (SEPSIS)
1000.0000 mL | Freq: Once | INTRAVENOUS | Status: AC
  Administered 2017-08-07: 1000 mL via INTRAVENOUS

## 2017-08-07 MED ORDER — ONDANSETRON HCL 4 MG PO TABS: 4.0000 mg | ORAL_TABLET | Freq: Four times a day (QID) | ORAL | Status: DC | PRN

## 2017-08-07 MED ORDER — NICOTINE 21 MG/24HR TD PT24
21.0000 mg | MEDICATED_PATCH | Freq: Every day | TRANSDERMAL | Status: DC
  Administered 2017-08-15 – 2017-08-17 (×2): 21 mg via TRANSDERMAL
  Filled 2017-08-07 (×9): qty 1

## 2017-08-07 MED ORDER — HYDROMORPHONE HCL 1 MG/ML IJ SOLN
0.5000 mg | Freq: Once | INTRAMUSCULAR | Status: AC
  Administered 2017-08-07: 0.5 mg via INTRAVENOUS
  Filled 2017-08-07: qty 1

## 2017-08-07 MED ORDER — PANTOPRAZOLE SODIUM 40 MG PO TBEC: 40.0000 mg | DELAYED_RELEASE_TABLET | Freq: Every day | ORAL | Status: DC

## 2017-08-07 MED ORDER — IOPAMIDOL (ISOVUE-370) INJECTION 76%
100.0000 mL | Freq: Once | INTRAVENOUS | Status: AC | PRN
  Administered 2017-08-07: 100 mL via INTRAVENOUS

## 2017-08-07 MED ORDER — LIDOCAINE VISCOUS 2 % MT SOLN
15.0000 mL | Freq: Once | OROMUCOSAL | Status: AC
  Administered 2017-08-07: 15 mL via OROMUCOSAL
  Filled 2017-08-07: qty 15

## 2017-08-07 MED ORDER — GUAIFENESIN 100 MG/5ML PO SOLN
200.0000 mg | Freq: Four times a day (QID) | ORAL | Status: DC | PRN
  Administered 2017-08-08: 200 mg via ORAL
  Filled 2017-08-07: qty 20
  Filled 2017-08-07: qty 10

## 2017-08-07 MED ORDER — HYDRALAZINE HCL 20 MG/ML IJ SOLN: 5.0000 mg | INTRAMUSCULAR | Status: DC | PRN

## 2017-08-07 MED ORDER — IOPAMIDOL (ISOVUE-370) INJECTION 76%
INTRAVENOUS | Status: AC
  Administered 2017-08-07: 100 mL via INTRAVENOUS
  Filled 2017-08-07: qty 100

## 2017-08-07 MED ORDER — HYDROMORPHONE HCL 1 MG/ML IJ SOLN: 1.0000 mg | Freq: Once | INTRAMUSCULAR | Status: DC

## 2017-08-07 MED ORDER — MORPHINE SULFATE ER 15 MG PO TBCR
15.0000 mg | EXTENDED_RELEASE_TABLET | Freq: Two times a day (BID) | ORAL | Status: DC
  Administered 2017-08-07 – 2017-08-10 (×6): 15 mg via ORAL
  Filled 2017-08-07 (×6): qty 1

## 2017-08-07 MED ORDER — SODIUM CHLORIDE 0.9 % IV BOLUS (SEPSIS)
500.0000 mL | Freq: Once | INTRAVENOUS | Status: AC
  Administered 2017-08-07: 500 mL via INTRAVENOUS

## 2017-08-07 MED ORDER — ZOLPIDEM TARTRATE 5 MG PO TABS
5.0000 mg | ORAL_TABLET | Freq: Every evening | ORAL | Status: DC | PRN
  Administered 2017-08-09: 5 mg via ORAL
  Filled 2017-08-07: qty 1

## 2017-08-07 MED ORDER — NICOTINE 21 MG/24HR TD PT24: 21.0000 mg | MEDICATED_PATCH | Freq: Every day | TRANSDERMAL | Status: DC

## 2017-08-07 MED ORDER — LIDOCAINE-PRILOCAINE 2.5-2.5 % EX CREA
1.0000 "application " | TOPICAL_CREAM | CUTANEOUS | Status: DC | PRN
  Administered 2017-08-09: 1 via TOPICAL
  Filled 2017-08-07 (×2): qty 5

## 2017-08-07 MED ORDER — FENTANYL 25 MCG/HR TD PT72: 25.0000 ug | MEDICATED_PATCH | TRANSDERMAL | Status: DC

## 2017-08-07 MED ORDER — POTASSIUM CHLORIDE 20 MEQ/15ML (10%) PO SOLN
40.0000 meq | Freq: Once | ORAL | Status: AC
  Administered 2017-08-07: 40 meq via ORAL
  Filled 2017-08-07: qty 30

## 2017-08-07 MED ORDER — CLONAZEPAM 0.5 MG PO TABS
0.5000 mg | ORAL_TABLET | Freq: Two times a day (BID) | ORAL | Status: DC | PRN
  Administered 2017-08-08: 0.5 mg via ORAL
  Filled 2017-08-07: qty 1

## 2017-08-07 MED ORDER — ONDANSETRON HCL 4 MG/2ML IJ SOLN: 4.0000 mg | Freq: Four times a day (QID) | INTRAMUSCULAR | Status: DC | PRN

## 2017-08-07 MED ORDER — ACETAMINOPHEN 650 MG RE SUPP: 650.0000 mg | Freq: Four times a day (QID) | RECTAL | Status: DC | PRN

## 2017-08-07 MED ORDER — HYDROCODONE-ACETAMINOPHEN 7.5-325 MG/15ML PO SOLN
15.0000 mL | Freq: Once | ORAL | Status: AC
  Administered 2017-08-07: 15 mL via ORAL
  Filled 2017-08-07: qty 15

## 2017-08-07 MED ORDER — ALBUTEROL SULFATE (2.5 MG/3ML) 0.083% IN NEBU: 2.5000 mg | INHALATION_SOLUTION | RESPIRATORY_TRACT | Status: DC | PRN

## 2017-08-07 NOTE — H&P (Signed)
History and Physical    Trevor Zavala:[id number] DOB: February 18, 1966 DOA: 08/07/2017  Referring MD/NP/PA:   PCP: Cassandria Anger, MD   Patient coming from:  The patient is coming from home.  At baseline, pt is independent for most of ADL.  Chief Complaint: Dysphagia, generalized weakness and shortness of breath  HPI: Trevor Zavala is a 51 y.o. male with medical history significant of stage-III squamous cell lung cancer, squamous cell cancer of the epiglottis, currently on chemotherapy and radiation therapy, hypertension, COPD, GERD, depression, anxiety, tobacco abuse, who presents with dysphagia, generalized weakness and SOB.  Patient states that he has dysphagia because of epiglottis cancer and radiation. He can only eats little liquid food, and has significantly decreased appetite and oral intake recently. He states that he is scheduled to have PEG tube placed on next Tuesday. He also reports that he has shortness of breath and cough. He coughs up thick clear mucus, but no chest pain, fever or chills. No tenderness in the calf areas. He states that he he has tightness and severe pain in his throat which he attributes to the radiation. No nausea, vomiting, diarrhea or abdominal pain. Denies symptoms of UTI or unilateral weakness.  ED Course: pt was found to have  WBC 2.4, lactic acid 0.49, potassium 3.2, normal renal function, tachycardia, tachypnea, oxygen sat 90% on room air, negative chest x-ray. Patient is admitted to telemetry bed as inpatient.  CTA of chest showed 1. No pulmonary embolus. 2. Left hilar mass has decreased in size, persistent encasement of the left pulmonary artery. Decreased postobstructive pneumonitis in the left upper lobe. 3. Previous spiculated right upper lobe 12 mm nodule has resolvedwith minimal residual scarring/ cystic focus. 4. Left supraclavicular node has decreased in size. 5. Bubbly debris within the left mainstem bronchus and right bronchus  intermedius suggesting mucus.  CT-soft tissue of neck: 1. Mild post radiation supraglottic edema, patent airway. 2. Smaller pathologic LEFT supraclavicular lymph node.  Review of Systems:   General: no fevers, chills, no body weight gain, has poor appetite, has fatigue, has dysphagia  HEENT: no blurry vision, hearing changes or sore throat Respiratory: has dyspnea, coughing, no wheezing CV: no chest pain, no palpitations GI: no nausea, vomiting, abdominal pain, diarrhea, constipation GU: no dysuria, burning on urination, increased urinary frequency, hematuria  Ext: no leg edema Neuro: no unilateral weakness, numbness, or tingling, no vision change or hearing loss Skin: has skin changes secondary from radiation in neck MSK: No muscle spasm, no deformity, no limitation of range of movement in spin Heme: No easy bruising.  Travel history: No recent long distant travel.  Allergy: No Known Allergies  Past Medical History:  Diagnosis Date  . Anxiety   . COPD (chronic obstructive pulmonary disease) ([place])   . GERD (gastroesophageal reflux disease)   . Headache   . Hypertension   . Pneumonia   . Situational depression   . Stage III squamous cell carcinoma of left lung ([place]) 06/19/2017    Past Surgical History:  Procedure Laterality Date  . COLONOSCOPY    . DIRECT LARYNGOSCOPY N/A 06/25/2017   Procedure: DIRECT LARYNGOSCOPY AND BIOPSY;  Surgeon: Rozetta Nunnery, MD;  Location: [place];  Service: ENT;  Laterality: N/A;  . EAR CYST EXCISION N/A 11/17/2013   Procedure: SEBACEOUS CYST CHEST;  Surgeon: Joyice Faster. Cornett, MD;  Location: [place];  Service: General;  Laterality: N/A;  . KNEE ARTHROSCOPY     LEFT  .  LIPOMA EXCISION N/A 11/17/2013   Procedure: EXCISION LIPOMA FOREHEAD;  Surgeon: Joyice Faster. Cornett, MD;  Location: [place];  Service: General;  Laterality: N/A;  . LUNG BIOPSY Bilateral 06/12/2017   Procedure: LEFT LUNG  BIOPSY;  Surgeon: Grace Isaac, MD;  Location: [facility];  Service: Thoracic;  Laterality: Bilateral;  . PORTACATH PLACEMENT Right 07/01/2017   Procedure: INSERTION PORT-A-CATH - RIGHT IJ - placed with Fluoro and Ultrasound;  Surgeon: Grace Isaac, MD;  Location: [place];  Service: Thoracic;  Laterality: Right;  Marland Kitchen VIDEO BRONCHOSCOPY WITH ENDOBRONCHIAL ULTRASOUND N/A 06/12/2017   Procedure: VIDEO BRONCHOSCOPY WITH ENDOBRONCHIAL ULTRASOUND;  Surgeon: Grace Isaac, MD;  Location: [place];  Service: Thoracic;  Laterality: N/A;    Social History:  reports that he has been smoking.  He has a 8.75 pack-year smoking history. He has never used smokeless tobacco. He reports that he drinks alcohol. He reports that he does not use drugs.  Family History:  Family History  Problem Relation Age of Onset  . Breast cancer Mother   . Hypertension Mother   . Colon cancer Mother   . Skin cancer Father   . Hypertension Father   . Colon polyps Father   . Cancer Father 66       b cell lymphoma  . Colon cancer Father      Prior to Admission medications   Medication Sig Start Date End Date Taking? Authorizing Provider  BREO ELLIPTA 100-25 MCG/INH AEPB INHALE 1 PUFF INTO THE LUNGS DAILY 01/26/17  Yes [name], [name], MD  Cholecalciferol (VITAMIN D PO) Take 1 tablet by mouth daily.   Yes [provider]  clonazePAM (KLONOPIN) 0.5 MG tablet Take 1 tablet (0.5 mg total) by mouth 2 (two) times daily as needed for anxiety. 02/19/17  Yes [name], [name], MD  emollient (BIAFINE) cream Apply topically as needed.   Yes [provider]  esomeprazole (NEXIUM) 40 MG capsule TAKE 1 CAPSULE BY MOUTH DAILY WITH BREAKFAST 01/16/17  Yes [name], [name], MD  fentaNYL (DURAGESIC - DOSED MCG/HR) 25 MCG/HR patch Place 1 patch (25 mcg total) onto the skin every 3 (three) days. 08/06/17  Yes Tyler Pita, MD  lidocaine-prilocaine (EMLA) cream Apply 1 application topically as needed. 07/02/17  Yes  Grace Isaac, MD  loratadine (CLARITIN) 10 MG tablet Take 10 mg by mouth daily as needed for allergies.   Yes [provider]  Morphine Sulfate ER 15 MG TBEA Take 15 mg by mouth 2 (two) times daily. 08/03/17  Yes Hayden Pedro, PA-C  nicotine (NICODERM CQ) 21 mg/24hr patch Place 1 patch (21 mg total) onto the skin daily. 06/06/17  Yes Deno Etienne, DO  oxyCODONE (OXY IR/ROXICODONE) 5 MG immediate release tablet Take 1-2 tablets (5-10 mg total) by mouth every 4 (four) hours as needed for severe pain. smallest size pill possible 07/27/17  Yes Hayden Pedro, PA-C  prochlorperazine (COMPAZINE) 10 MG tablet Take 1 tablet (10 mg total) by mouth every 6 (six) hours as needed for nausea or vomiting. 06/19/17  Yes Curt Bears, MD  acyclovir (ZOVIRAX) 400 MG tablet TAKE 1 TABLET (400 MG TOTAL) BY MOUTH 3 (THREE) TIMES DAILY. Patient not taking: Reported on 07/06/2017 01/21/16   [name], [name], MD  aspirin (ASPIRIN CHILDRENS) 81 MG chewable tablet Chew 1 tablet (81 mg total) by mouth daily. Patient not taking: Reported on 07/20/2017 02/03/14   [name], [name], MD  lidocaine (XYLOCAINE) 2 % solution  5 ml po q 8 hours as needed for mouth and throat pain Patient not taking: Reported on 08/03/2017 07/20/17   Curt Bears, MD  mucosal barrier oral (GELCLAIR) GEL Take 1 packet by mouth 3 (three) times daily as needed. Patient not taking: Reported on 08/07/2017 07/27/17   Hayden Pedro, PA-C  sucralfate (CARAFATE) 1 g tablet Take 1 tablet (1 g total) by mouth 4 (four) times daily -  with meals and at bedtime. Crush and Mix in 1 oz water to make slurry Patient not taking: Reported on 08/07/2017 07/14/17   Hayden Pedro, PA-C  valsartan (DIOVAN) 160 MG tablet Take 1 tablet (160 mg total) by mouth daily. Patient not taking: Reported on 07/20/2017 01/16/17 01/16/18  [name], [name], MD  vitamin e (AQUEOUS VITAMIN E) 15 UNIT/0.3ML SOLN solution Take 1 mL (50 Units  total) by mouth 3 (three) times daily as needed. Patient not taking: Reported on 08/07/2017 08/04/17   Hayden Pedro, PA-C    Physical Exam: Vitals:   08/07/17 2200 08/07/17 2215 08/07/17 2230 08/07/17 2300  BP: 118/70  113/67 108/64  Pulse: 83 93 (!) 103 82  Resp: 17 15 19 20   Temp:      TempSrc:      SpO2: (!) 89% 95% 93% 92%  Weight:      Height:       General: Not in acute distress.  Dry mucus and membrane HEENT:       Eyes: PERRL, EOMI, no scleral icterus.       ENT: No discharge from the ears and nose, no pharynx injection, no tonsillar enlargement.        Neck: No JVD, no bruit, no mass felt. Heme: No neck lymph node enlargement. Cardiac: S1/S2, RRR, No murmurs, No gallops or rubs. Respiratory: No rales, wheezing, rhonchi or rubs. GI: Soft, nondistended, nontender, no rebound pain, no organomegaly, BS present. GU: No hematuria Ext: No pitting leg edema bilaterally. 2+DP/PT pulse bilaterally. Musculoskeletal: No joint deformities, No joint redness or warmth, no limitation of ROM in spin. Skin: has skin changes secondary from radiation in neck Neuro: Alert, oriented X3, cranial nerves II-XII grossly intact, moves all extremities normally.  Psych: Patient is not psychotic, no suicidal or hemocidal ideation.  Labs on Admission: I have personally reviewed following labs and imaging studies  CBC:  Recent Labs Lab 08/03/17 0757 08/07/17 1833  WBC 3.6* 2.4*  NEUTROABS 2.7 2.0  HGB 10.9* 11.3*  HCT 32.6* 33.1*  MCV 96.2 93.8  PLT 200 295   Basic Metabolic Panel:  Recent Labs Lab 08/03/17 0757 08/07/17 1833  NA 136 140  K 4.0 3.2*  CL  --  104  CO2 30* 26  GLUCOSE 145* 87  BUN 9.4 13  CREATININE 0.7 0.49*  CALCIUM 9.8 9.0   GFR: Estimated Creatinine Clearance: 103.9 mL/min (A) (by C-G formula based on SCr of 0.49 mg/dL (L)). Liver Function Tests:  Recent Labs Lab 08/03/17 0757 08/07/17 1833  AST 14 20  ALT 23 43  ALKPHOS 69 58  BILITOT 0.54  1.0  PROT 6.6 6.1*  ALBUMIN 3.0* 3.1*   No results for input(s): LIPASE, AMYLASE in the last 168 hours. No results for input(s): AMMONIA in the last 168 hours. Coagulation Profile: No results for input(s): INR, PROTIME in the last 168 hours. Cardiac Enzymes: No results for input(s): CKTOTAL, CKMB, CKMBINDEX, TROPONINI in the last 168 hours. BNP (last 3 results) No results for input(s): PROBNP in the last  8760 hours. HbA1C: No results for input(s): HGBA1C in the last 72 hours. CBG: No results for input(s): GLUCAP in the last 168 hours. Lipid Profile: No results for input(s): CHOL, HDL, LDLCALC, TRIG, CHOLHDL, LDLDIRECT in the last 72 hours. Thyroid Function Tests: No results for input(s): TSH, T4TOTAL, FREET4, T3FREE, THYROIDAB in the last 72 hours. Anemia Panel: No results for input(s): VITAMINB12, FOLATE, FERRITIN, TIBC, IRON, RETICCTPCT in the last 72 hours. Urine analysis:    Component Value Date/Time   COLORURINE YELLOW 06/01/2017 0848   APPEARANCEUR CLEAR 06/01/2017 0848   LABSPEC <=1.005 (A) 06/01/2017 0848   PHURINE 6.0 06/01/2017 0848   GLUCOSEU NEGATIVE 06/01/2017 0848   HGBUR NEGATIVE 06/01/2017 0848   BILIRUBINUR NEGATIVE 06/01/2017 0848   KETONESUR NEGATIVE 06/01/2017 0848   UROBILINOGEN 0.2 06/01/2017 0848   NITRITE NEGATIVE 06/01/2017 0848   LEUKOCYTESUR NEGATIVE 06/01/2017 0848   Sepsis Labs: @LABRCNTIP (procalcitonin:4,lacticidven:4) )No results found for this or any previous visit (from the past 240 hour(s)).   Radiological Exams on Admission: Dg Chest 2 View  Result Date: 08/07/2017 CLINICAL DATA:  Productive cough. Shortness of breath with exertion. Currently undergoing chemotherapy and radiation therapy for throat and lung cancer. Ex-smoker. EXAM: CHEST  2 VIEW COMPARISON:  07/01/2017. FINDINGS: Normal with sized heart. Clear lungs. Right jugular porta catheter tip at the junction of the superior vena cava and right atrium. Thoracic spine degenerative  changes. IMPRESSION: No acute abnormality. Electronically Signed   By: Claudie Revering M.D.   On: 08/07/2017 18:30   Ct Soft Tissue Neck W Contrast  Result Date: 08/07/2017 CLINICAL DATA:  Shortness of breath and neck swelling after radiation. History of lung cancer. EXAM: CT NECK WITH CONTRAST TECHNIQUE: Multidetector CT imaging of the neck was performed using the standard protocol following the bolus administration of intravenous contrast. CONTRAST:  100 cc Isovue 370 COMPARISON:  PET- CT June 10, 2017 FINDINGS: PHARYNX AND LARYNX: Mild late data/infiltrated paraglottic fat with thickened edematous epiglottis. Patent airway. Normal larynx. SALIVARY GLANDS: Normal. THYROID: Normal. LYMPH NODES: 13 mm LEFT supraclavicular lymph node was 16 mm. VASCULAR: Normal. LIMITED INTRACRANIAL: Normal. VISUALIZED ORBITS: Normal. MASTOIDS AND VISUALIZED PARANASAL SINUSES: Small LEFT maxillary sinus mucosal retention cyst. SKELETON: Nonacute.  RIGHT chest Port-A-Cath. UPPER CHEST: Please see CT chest from same day, reported separately for dedicated findings. OTHER: None. IMPRESSION: 1. Mild post radiation supraglottic edema, patent airway. 2. Smaller pathologic LEFT supraclavicular lymph node. Electronically Signed   By: Elon Alas M.D.   On: 08/07/2017 21:52   Ct Angio Chest Pe W And/or Wo Contrast  Result Date: 08/07/2017 CLINICAL DATA:  PE suspected, high pretest prob. Shortness of breath and neck swelling. Postradiation, stage III squamous cell carcinoma of left lung. EXAM: CT ANGIOGRAPHY CHEST WITH CONTRAST TECHNIQUE: Multidetector CT imaging of the chest was performed using the standard protocol during bolus administration of intravenous contrast. Multiplanar CT image reconstructions and MIPs were obtained to evaluate the vascular anatomy. CONTRAST:  100 cc Isovue 370 IV COMPARISON:  Chest radiograph earlier this day. PET-CT 06/10/2017. Chest CT PE protocol 06/06/2017 FINDINGS: Cardiovascular: Stable encasement  of the left pulmonary artery at the left hilum without filling defects to suggest pulmonary embolus. Small caliber left upper lobe pulmonary vessels secondary to encasement. No filling defects throughout the right pulmonary arteries to suggest pulmonary embolus. The heart is normal in size. Normal caliber thoracic aorta with mild atherosclerosis. Right chest port in place. No evidence of IVC thrombus. No pericardial effusion. Mediastinum/Nodes: Left hilar mass (measurement includes  the left pulmonary artery) measures 5.4 x 5.3 cm, slight decrease from prior PET using same measurement technique. This encases the left pulmonary artery and left upper lobe bronchus. Left supraclavicular node measures 12 mm previously 16 mm. Decreased size of left paratracheal node from prior PET. No new mediastinal or hilar adenopathy. The hypermetabolic right infrahilar node on prior PET is normal in size. Lungs/Pleura: Mild emphysema. The previous 12 mm spiculated right upper lobe nodule has resolved with faint scarring and cystic focus image 32 series 12. Improving postobstructive pneumonitis in the lingula and left upper lobe. 5 mm faint left upper lobe nodule image 23 series 12, and decreased from prior exam. No new pulmonary nodule or focal airspace disease. There is bubbly debris within the left mainstem bronchi and right bronchus intermedius. No pleural fluid. Upper Abdomen: No acute finding.  No adrenal nodule. Musculoskeletal: There are no acute or suspicious osseous abnormalities. No focal bone lesion. Review of the MIP images confirms the above findings. IMPRESSION: 1. No pulmonary embolus. 2. Left hilar mass has decreased in size, persistent encasement of the left pulmonary artery. Decreased postobstructive pneumonitis in the left upper lobe. 3. Previous spiculated right upper lobe 12 mm nodule has resolved with minimal residual scarring/ cystic focus. 4. Left supraclavicular node has decreased in size. 5. Bubbly debris  within the left mainstem bronchus and right bronchus intermedius suggesting mucus. Aortic Atherosclerosis (ICD10-I70.0) and Emphysema (ICD10-J43.9). Electronically Signed   By: Jeb Levering M.D.   On: 08/07/2017 21:58     EKG: Independently reviewed. Sinus rhythm, QTC 450, early R-wave progression.  Assessment/Plan Principal Problem:   Dysphagia Active Problems:   TOBACCO USER   COPD mixed type ([facility])   GERD   Generalized anxiety disorder   Essential hypertension   Stage III squamous cell carcinoma of left lung ([facility])   Encounter for antineoplastic chemotherapy   Squamous cell carcinoma of epiglottis ([facility])   Protein-calorie malnutrition, moderate ([facility])   Hypokalemia   SOB (shortness of breath)   Dehydration   Dysphagia, moderate protein-calorie malnutrition and dehydration: pt has dysphagia due to cancer of epiglottis, which has worsened with radiation therapy, causing decreased oral intake, protein calorie malnutrition and dehydration. Pt is scheduled for PEG tube placement of Tuesday.  -will admit to tele bed as inpt -May call IR in AM for placing PEG tube in hospital.  -IVF: 1.5 L NS bolus, then 125 cc/h -consult to nutrition  Stage III squamous cell carcinoma of left lung and Squamous cell carcinoma of epiglottis: The patient is currently doing chemotherapy and radiation therapy. Patient is followed up by Dr. Julien Nordmann. Pt states that he skipped last 2 radiation therapy. -f/u with dr. Julien Nordmann -change prn oxycodone to prn Hycet solution for pain -on Morphine sulfate ER  SOB in the setting of lung cancer and COPD: Patient does not have for wheezing on auscultation, but has rhonchi. CT angiogram of chest is negative for PE. CTA showed decreased size of left hilar mass and it decreased postobstructive pneumonitis in left upper lobe. The previous spiculated right upper lobe 12 mm nodule has resolved with minimal residual scarring/ cystic focus, cancer burden seems to have decreased  in response to chemotherapy and radiation therapy. -prn albuterol Nebs -prn for Robitussin syrup for cough  GERD: -Protonix -->change to IV pepcid prn  Generalized anxiety disorder: -continue home Klonopin -When necessary Ativan IV  Hypokalemia: K= 3.2   on admission. - Repleted - Check Mg level  GERD: -Protonix -Pepcid IV -will switch  PPI to pepcid IV until C diff pcr negative  Tobacco abuse: -Did counseling about importance of quitting smoking -Nicotine patch    Essential hypertension: bp is 136.  Pt is not taking his Diovan -prn hydralazine IV   DVT ppx: SCD Code Status: Full code Family Communication: None at bed side.     Disposition Plan:  Anticipate discharge back to previous home environment Consults called:  none Admission status:   Inpatient/tele     Date of Service 08/07/2017    Ivor Costa [facility] Pager [phone]  If 7PM-7AM, please contact night-coverage [email or web address] Password [email or web address] 08/07/2017, 11:15 PM

## 2017-08-07 NOTE — ED Notes (Signed)
Pt ambulatory to restroom with little assistance

## 2017-08-07 NOTE — Telephone Encounter (Signed)
Wife called to let Dr Julien Nordmann know that pt is on the way to [facility] ED via ambulance. He was SOB. He had called wife to come home and take him to ED. When she got home EMS was there and checking him over. She states his sats improved on O2 but does not know much else. Assured her they did the right thing. Let her know Dr Julien Nordmann not present now but will get message on Monday morning.

## 2017-08-07 NOTE — ED Provider Notes (Addendum)
[facility] DEPT Provider Note   CSN: [id number] Arrival date & time: 08/07/17  1651     History   Chief Complaint Chief Complaint  Patient presents with  . Shortness of Breath    HPI Trevor Zavala is a 51 y.o. male.  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 He describes the shortness of breath as tightness in his throat. He also states that he is coughing up thick, clear sputum. He has had decreased by mouth intake due to tightness and pain in his throat which he attributes to the radiation. Reports that he is only taking about 10 mL of liquid today. This has been ongoing for a week. States that he was recently started on fentanyl patch which relieved his throat pain last night and this morning however the pain started getting worse throughout the day today. He denies any other alleviating or aggravating factors. He denies any fevers, chills, chest pain, per for edema, abdominal pain, nausea, vomiting. Denies any other physical complaints.   Past Medical History:  Diagnosis Date  . Anxiety   . COPD (chronic obstructive pulmonary disease) ([place])   . GERD (gastroesophageal reflux disease)   . Headache   . Hypertension   . Pneumonia   . Situational depression   . Stage III squamous cell carcinoma of left lung ([place]) 06/19/2017    Patient Active Problem List   Diagnosis Date Noted  . Protein-calorie malnutrition, moderate ([place]) 08/07/2017  . Hypokalemia 08/07/2017  . SOB (shortness of breath) 08/07/2017  . Dehydration 08/07/2017  . Port catheter in place 08/03/2017  . 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 Stage III squamous cell carcinoma of left lung ([place]) 06/19/2017  . Encounter for antineoplastic chemotherapy 06/19/2017  . Goals of care, counseling/discussion  06/19/2017  . Lung mass 06/08/2017  . Hemoptysis 06/01/2017  . Weight loss 06/01/2017  . Colon polyps 01/16/2017  . Essential hypertension 01/16/2017  . Generalized anxiety disorder 06/22/2015  . Situational depression 02/03/2014  . Post-operative state 12/02/2013  . Infected epidermoid cyst 10/28/2013  . Lipoma of face 10/18/2013  . Well adult exam 10/07/2013  . Hand eczema 10/07/2013  . Wheezing on auscultation 06/01/2012  . Chest pain 06/01/2012  . Back pain 06/01/2012  . Sebaceous cyst 04/05/2012  . Contracture of palmar fascia (Dupuytren's) 04/05/2012  . Lipoma of forehead 04/05/2012  . Cold sore 03/19/2012  . Rhinitis, chronic 08/22/2011  . OTITIS MEDIA, MUCOID, CHRONIC 12/31/2010  . Dysphagia 08/23/2010  . TOBACCO USER 07/10/2010  . COPD mixed type ([place]) 07/10/2010  . GERD 07/10/2010  . Cough 07/10/2010    Past Surgical History:  Procedure Laterality Date  . COLONOSCOPY    . DIRECT LARYNGOSCOPY N/A 06/25/2017   Procedure: DIRECT LARYNGOSCOPY AND BIOPSY;  Surgeon: Rozetta Nunnery, MD;  Location: [facility];  Service: ENT;  Laterality: N/A;  . EAR CYST EXCISION N/A 11/17/2013   Procedure: SEBACEOUS CYST CHEST;  Surgeon: Joyice Faster. Cornett, MD;  Location: [facility];  Service: General;  Laterality: N/A;  . KNEE ARTHROSCOPY     LEFT  . LIPOMA EXCISION N/A 11/17/2013   Procedure: EXCISION LIPOMA FOREHEAD;  Surgeon: Joyice Faster. Cornett, MD;  Location: [facility];  Service: General;  Laterality: N/A;  . LUNG BIOPSY Bilateral 06/12/2017   Procedure: LEFT  LUNG BIOPSY;  Surgeon: Grace Isaac, MD;  Location: [facility];  Service: Thoracic;  Laterality: Bilateral;  . PORTACATH PLACEMENT Right 07/01/2017   Procedure: INSERTION PORT-A-CATH - RIGHT IJ - placed with Fluoro and Ultrasound;  Surgeon: Grace Isaac, MD;  Location: [facility];  Service: Thoracic;  Laterality: Right;  Marland Kitchen VIDEO BRONCHOSCOPY WITH ENDOBRONCHIAL ULTRASOUND N/A  06/12/2017   Procedure: VIDEO BRONCHOSCOPY WITH ENDOBRONCHIAL ULTRASOUND;  Surgeon: Grace Isaac, MD;  Location: [facility];  Service: Thoracic;  Laterality: N/A;       Home Medications    Prior to Admission medications   Medication Sig Start Date End Date Taking? Authorizing Provider  BREO ELLIPTA 100-25 MCG/INH AEPB INHALE 1 PUFF INTO THE LUNGS DAILY 01/26/17  Yes [name], [name], MD  Cholecalciferol (VITAMIN D PO) Take 1 tablet by mouth daily.   Yes [provider]  clonazePAM (KLONOPIN) 0.5 MG tablet Take 1 tablet (0.5 mg total) by mouth 2 (two) times daily as needed for anxiety. 02/19/17  Yes [name], [name], MD  emollient (BIAFINE) cream Apply topically as needed.   Yes [provider]  esomeprazole (NEXIUM) 40 MG capsule TAKE 1 CAPSULE BY MOUTH DAILY WITH BREAKFAST 01/16/17  Yes [name], [name], MD  fentaNYL (DURAGESIC - DOSED MCG/HR) 25 MCG/HR patch Place 1 patch (25 mcg total) onto the skin every 3 (three) days. 08/06/17  Yes Tyler Pita, MD  lidocaine-prilocaine (EMLA) cream Apply 1 application topically as needed. 07/02/17  Yes Grace Isaac, MD  loratadine (CLARITIN) 10 MG tablet Take 10 mg by mouth daily as needed for allergies.   Yes [provider]  Morphine Sulfate ER 15 MG TBEA Take 15 mg by mouth 2 (two) times daily. 08/03/17  Yes Hayden , PA-C  nicotine (NICODERM CQ) 21 mg/24hr patch Place 1 patch (21 mg total) onto the skin daily. 06/06/17  Yes Deno Etienne, DO  oxyCODONE (OXY IR/ROXICODONE) 5 MG immediate release tablet Take 1-2 tablets (5-10 mg total) by mouth every 4 (four) hours as needed for severe pain. smallest size pill possible 07/27/17  Yes Hayden , PA-C  prochlorperazine (COMPAZINE) 10 MG tablet Take 1 tablet (10 mg total) by mouth every 6 (six) hours as needed for nausea or vomiting. 06/19/17  Yes Curt Bears, MD  acyclovir (ZOVIRAX) 400 MG tablet TAKE 1 TABLET (400 MG TOTAL) BY MOUTH 3  (THREE) TIMES DAILY. Patient not taking: Reported on 07/06/2017 01/21/16   [name], [name], MD  aspirin (ASPIRIN CHILDRENS) 81 MG chewable tablet Chew 1 tablet (81 mg total) by mouth daily. Patient not taking: Reported on 07/20/2017 02/03/14   [name], [name], MD  lidocaine (XYLOCAINE) 2 % solution 5 ml po q 8 hours as needed for mouth and throat pain Patient not taking: Reported on 08/03/2017 07/20/17   Curt Bears, MD  mucosal barrier oral (GELCLAIR) GEL Take 1 packet by mouth 3 (three) times daily as needed. Patient not taking: Reported on 08/07/2017 07/27/17   Hayden , PA-C  sucralfate (CARAFATE) 1 g tablet Take 1 tablet (1 g total) by mouth 4 (four) times daily -  with meals and at bedtime. Crush and Mix in 1 oz water to make slurry Patient not taking: Reported on 08/07/2017 07/14/17   Hayden , PA-C  valsartan (DIOVAN) 160 MG tablet Take 1 tablet (160 mg total) by mouth daily. Patient not taking: Reported on 07/20/2017 01/16/17 01/16/18  [name], [name], MD  vitamin e (AQUEOUS VITAMIN E)  15 UNIT/0.3ML SOLN solution Take 1 mL (50 Units total) by mouth 3 (three) times daily as needed. Patient not taking: Reported on 08/07/2017 08/04/17   Hayden , PA-C    Family History Family History  Problem Relation Age of Onset  . Breast cancer Mother   . Hypertension Mother   . Colon cancer Mother   . Skin cancer Father   . Hypertension Father   . Colon polyps Father   . Cancer Father 41       b cell lymphoma  . Colon cancer Father     Social History Social History  Substance Use Topics  . Smoking status: Current Every Day Smoker    Packs/day: 0.25    Years: 35.00  . Smokeless tobacco: Never Used     Comment: down to 2 cig/day 06/30/17  . Alcohol use 0.0 oz/week     Comment: SOCIAL     Allergies   Patient has no known allergies.   Review of Systems Review of Systems All other systems are reviewed and are negative for acute change  except as noted in the HPI   Physical Exam Updated Vital Signs BP 103/70   Pulse 91   Temp 98.2 F (36.8 C) (Axillary)   Resp 15   Ht 5\' 10"  (1.778 m)   Wt 68 kg (150 lb)   SpO2 95%   BMI 21.52 kg/m   Physical Exam  Constitutional: He is oriented to person, place, and time. He appears well-developed and well-nourished. No distress.  Chronically ill-appearing  HENT:  Head: Normocephalic and atraumatic.  Nose: Nose normal.  Mouth/Throat: Oropharynx is clear and moist and mucous membranes are normal.  Eyes: Pupils are equal, round, and reactive to light. Conjunctivae and EOM are normal. Right eye exhibits no discharge. Left eye exhibits no discharge. No scleral icterus.  Neck: Normal range of motion. Neck supple.  Skin changes secondary from radiation  Cardiovascular: Normal rate and regular rhythm.  Exam reveals no gallop and no friction rub.   No murmur heard. Pulmonary/Chest: [place] normal. No stridor. No tachypnea and no bradypnea. No respiratory distress. He has no wheezes. He has rhonchi (diffuse). He has no rales.  No stridor  Abdominal: Soft. He exhibits no distension. There is no tenderness.  Musculoskeletal: He exhibits no edema or tenderness.  Neurological: He is alert and oriented to person, place, and time.  Skin: Skin is warm and dry. No rash noted. He is not diaphoretic. No erythema.  Psychiatric: He has a normal mood and affect.  Vitals reviewed.    ED Treatments / Results  Labs (all labs ordered are listed, but only abnormal results are displayed) Labs Reviewed  CBC WITH DIFFERENTIAL/PLATELET - Abnormal; Notable for the following:       Result Value   WBC 2.4 (*)    RBC 3.53 (*)    Hemoglobin 11.3 (*)    HCT 33.1 (*)    Lymphs Abs 0.3 (*)    All other components within normal limits  COMPREHENSIVE METABOLIC PANEL - Abnormal; Notable for the following:    Potassium 3.2 (*)    Creatinine, Ser 0.49 (*)    Total Protein 6.1 (*)    Albumin 3.1 (*)     All other components within normal limits  I-STAT CG4 LACTIC ACID, ED - Abnormal; Notable for the following:    Lactic Acid, Venous 0.49 (*)    All other components within normal limits  MAGNESIUM  PROTIME-INR  APTT  HIV  ANTIBODY (ROUTINE TESTING)  BASIC METABOLIC PANEL  CBC    EKG  EKG Interpretation None       Radiology Dg Chest 2 View  Result Date: 08/07/2017 CLINICAL DATA:  Productive cough. Shortness of breath with exertion. Currently undergoing chemotherapy and radiation therapy for throat and lung cancer. Ex-smoker. EXAM: CHEST  2 VIEW COMPARISON:  07/01/2017. FINDINGS: Normal with sized heart. Clear lungs. Right jugular porta catheter tip at the junction of the superior vena cava and right atrium. Thoracic spine degenerative changes. IMPRESSION: No acute abnormality. Electronically Signed   By: Claudie Revering M.D.   On: 08/07/2017 18:30   Ct Soft Tissue Neck W Contrast  Result Date: 08/07/2017 CLINICAL DATA:  Shortness of breath and neck swelling after radiation. History of lung cancer. EXAM: CT NECK WITH CONTRAST TECHNIQUE: Multidetector CT imaging of the neck was performed using the standard protocol following the bolus administration of intravenous contrast. CONTRAST:  100 cc Isovue 370 COMPARISON:  PET- CT June 10, 2017 FINDINGS: PHARYNX AND LARYNX: Mild late data/infiltrated paraglottic fat with thickened edematous epiglottis. Patent airway. Normal larynx. SALIVARY GLANDS: Normal. THYROID: Normal. LYMPH NODES: 13 mm LEFT supraclavicular lymph node was 16 mm. VASCULAR: Normal. LIMITED INTRACRANIAL: Normal. VISUALIZED ORBITS: Normal. MASTOIDS AND VISUALIZED PARANASAL SINUSES: Small LEFT maxillary sinus mucosal retention cyst. SKELETON: Nonacute.  RIGHT chest Port-A-Cath. UPPER CHEST: Please see CT chest from same day, reported separately for dedicated findings. OTHER: None. IMPRESSION: 1. Mild post radiation supraglottic edema, patent airway. 2. Smaller pathologic LEFT  supraclavicular lymph node. Electronically Signed   By: Elon Alas M.D.   On: 08/07/2017 21:52   Ct Angio Chest Pe W And/or Wo Contrast  Result Date: 08/07/2017 CLINICAL DATA:  PE suspected, high pretest prob. Shortness of breath and neck swelling. Postradiation, stage III squamous cell carcinoma of left lung. EXAM: CT ANGIOGRAPHY CHEST WITH CONTRAST TECHNIQUE: Multidetector CT imaging of the chest was performed using the standard protocol during bolus administration of intravenous contrast. Multiplanar CT image reconstructions and MIPs were obtained to evaluate the vascular anatomy. CONTRAST:  100 cc Isovue 370 IV COMPARISON:  Chest radiograph earlier this day. PET-CT 06/10/2017. Chest CT PE protocol 06/06/2017 FINDINGS: Cardiovascular: Stable encasement of the left pulmonary artery at the left hilum without filling defects to suggest pulmonary embolus. Small caliber left upper lobe pulmonary vessels secondary to encasement. No filling defects throughout the right pulmonary arteries to suggest pulmonary embolus. The heart is normal in size. Normal caliber thoracic aorta with mild atherosclerosis. Right chest port in place. No evidence of IVC thrombus. No pericardial effusion. Mediastinum/Nodes: Left hilar mass (measurement includes the left pulmonary artery) measures 5.4 x 5.3 cm, slight decrease from prior PET using same measurement technique. This encases the left pulmonary artery and left upper lobe bronchus. Left supraclavicular node measures 12 mm previously 16 mm. Decreased size of left paratracheal node from prior PET. No new mediastinal or hilar adenopathy. The hypermetabolic right infrahilar node on prior PET is normal in size. Lungs/Pleura: Mild emphysema. The previous 12 mm spiculated right upper lobe nodule has resolved with faint scarring and cystic focus image 32 series 12. Improving postobstructive pneumonitis in the lingula and left upper lobe. 5 mm faint left upper lobe nodule image 23  series 12, and decreased from prior exam. No new pulmonary nodule or focal airspace disease. There is bubbly debris within the left mainstem bronchi and right bronchus intermedius. No pleural fluid. Upper Abdomen: No acute finding.  No adrenal nodule. Musculoskeletal: There are  no acute or suspicious osseous abnormalities. No focal bone lesion. Review of the MIP images confirms the above findings. IMPRESSION: 1. No pulmonary embolus. 2. Left hilar mass has decreased in size, persistent encasement of the left pulmonary artery. Decreased postobstructive pneumonitis in the left upper lobe. 3. Previous spiculated right upper lobe 12 mm nodule has resolved with minimal residual scarring/ cystic focus. 4. Left supraclavicular node has decreased in size. 5. Bubbly debris within the left mainstem bronchus and right bronchus intermedius suggesting mucus. Aortic Atherosclerosis (ICD10-I70.0) and Emphysema (ICD10-J43.9). Electronically Signed   By: Jeb Levering M.D.   On: 08/07/2017 21:58    Procedures Procedures (including critical care time)  Medications Ordered in ED Medications  heparin lock flush 100 unit/mL (0 Units Intracatheter Hold 08/07/17 2157)  fentaNYL (DURAGESIC - dosed mcg/hr) patch 25 mcg (not administered)  morphine (MS CONTIN) 12 hr tablet 15 mg (15 mg Oral Given 08/07/17 2332)  oxyCODONE (Oxy IR/ROXICODONE) immediate release tablet 5-10 mg (not administered)  lidocaine-prilocaine (EMLA) cream 1 application (not administered)  PRUTECT emulsion (not administered)  nicotine (NICODERM CQ - dosed in mg/24 hours) patch 21 mg (not administered)  clonazePAM (KLONOPIN) tablet 0.5 mg (not administered)  fluticasone furoate-vilanterol (BREO ELLIPTA) 100-25 MCG/INH 1 puff (not administered)  pantoprazole (PROTONIX) EC tablet 40 mg (not administered)  potassium chloride 20 MEQ/15ML (10%) solution 40 mEq (not administered)  albuterol (PROVENTIL) (2.5 MG/3ML) 0.083% nebulizer solution 2.5 mg (not  administered)  0.9 %  sodium chloride infusion (not administered)  sodium chloride 0.9 % bolus 1,000 mL (1,000 mLs Intravenous New Bag/Given 08/07/17 2332)  guaifenesin (ROBITUSSIN) 100 MG/5ML syrup 200 mg (not administered)  acetaminophen (TYLENOL) tablet 650 mg (not administered)    Or  acetaminophen (TYLENOL) suppository 650 mg (not administered)  ondansetron (ZOFRAN) tablet 4 mg (not administered)    Or  ondansetron (ZOFRAN) injection 4 mg (not administered)  hydrALAZINE (APRESOLINE) injection 5 mg (not administered)  zolpidem (AMBIEN) tablet 5 mg (not administered)  sodium chloride 0.9 % bolus 1,000 mL (0 mLs Intravenous Stopped 08/07/17 2036)  HYDROmorphone (DILAUDID) injection 0.5 mg (0.5 mg Intravenous Given 08/07/17 1847)  lidocaine (XYLOCAINE) 2 % viscous mouth solution 15 mL (15 mLs Mouth/Throat Given 08/07/17 1847)  ipratropium-albuterol (DUONEB) 0.5-2.5 (3) MG/3ML nebulizer solution 3 mL (3 mLs Nebulization Given 08/07/17 1850)  sodium chloride 0.9 % bolus 500 mL (0 mLs Intravenous Stopped 08/07/17 2225)  HYDROcodone-acetaminophen (HYCET) 7.5-325 mg/15 ml solution 15 mL (15 mLs Oral Given 08/07/17 2137)  iopamidol (ISOVUE-370) 76 % injection 100 mL (100 mLs Intravenous Contrast Given 08/07/17 2112)     Initial Impression / Assessment and Plan / ED Course  I have reviewed the triage vital signs and the nursing notes.  Pertinent labs & imaging results that were available during my care of the patient were reviewed by me and considered in my medical decision making (see chart for details).     Workup did not reveal any infectious source. CT of the neck did not show evidence of airway obstruction. CTA without pulmonary embolism. CBC with white count trending white blood cell count which to be expected given his chemotherapy. Otherwise grossly reassuring. However given the patient's lack of oral intake, patient will be admitted for IV hydration and to expedite his INR PEG tube  placement.  Discussed case with the hospitalist who will admit the patient for further management  Final Clinical Impressions(s) / ED Diagnoses   Final diagnoses:  SOB (shortness of breath)  Decreased oral intake  Fatima Blank, MD 08/07/17 [phone]

## 2017-08-07 NOTE — ED Notes (Signed)
Delay in medication administration due to patient being in CT scan

## 2017-08-07 NOTE — ED Triage Notes (Signed)
Pt from home c/o SOB with exertion and productive cough. He does radiation 5 days a week, but skipped his last 2. He also gets chemo every Monday for lung and throat cancer. 20g in RAC. 12 lead with EMS unremarkable.

## 2017-08-08 DIAGNOSIS — R1319 Other dysphagia: Secondary | ICD-10-CM

## 2017-08-08 LAB — BASIC METABOLIC PANEL
Anion gap: 6 (ref 5–15)
BUN: 9 mg/dL (ref 6–20)
CALCIUM: 8.5 mg/dL — AB (ref 8.9–10.3)
CO2: 27 mmol/L (ref 22–32)
CREATININE: 0.44 mg/dL — AB (ref 0.61–1.24)
Chloride: 109 mmol/L (ref 101–111)
GFR calc non Af Amer: >60 mL/min (ref >60)
Glucose, Bld: 80 mg/dL (ref 65–99)
Potassium: 3.5 mmol/L (ref 3.5–5.1)
SODIUM: 142 mmol/L (ref 135–145)

## 2017-08-08 LAB — CBC
HCT: 27.3 % — ABNORMAL LOW (ref 39.0–52.0)
Hemoglobin: 9.5 g/dL — ABNORMAL LOW (ref 13.0–17.0)
MCH: 32.1 pg (ref 26.0–34.0)
MCHC: 34.8 g/dL (ref 30.0–36.0)
MCV: 92.2 fL (ref 78.0–100.0)
PLATELETS: 181 10*3/uL (ref 150–400)
RBC: 2.96 MIL/uL — AB (ref 4.22–5.81)
RDW: 13.3 % (ref 11.5–15.5)
WBC: 1.5 10*3/uL — AB (ref 4.0–10.5)

## 2017-08-08 LAB — GLUCOSE, CAPILLARY: GLUCOSE-CAPILLARY: 82 mg/dL (ref 65–99)

## 2017-08-08 LAB — MAGNESIUM: Magnesium: 1.9 mg/dL (ref 1.7–2.4)

## 2017-08-08 MED ORDER — HYDROCODONE-ACETAMINOPHEN 7.5-325 MG/15ML PO SOLN
15.0000 mL | ORAL | Status: DC | PRN
  Administered 2017-08-08 – 2017-08-09 (×6): 15 mL via ORAL
  Filled 2017-08-08 (×6): qty 15

## 2017-08-08 MED ORDER — LORAZEPAM 2 MG/ML IJ SOLN
0.5000 mg | Freq: Three times a day (TID) | INTRAMUSCULAR | Status: DC | PRN
  Administered 2017-08-08 – 2017-08-14 (×4): 0.5 mg via INTRAVENOUS
  Filled 2017-08-08 (×4): qty 1

## 2017-08-08 MED ORDER — FAMOTIDINE IN NACL 20-0.9 MG/50ML-% IV SOLN
20.0000 mg | Freq: Two times a day (BID) | INTRAVENOUS | Status: DC | PRN
  Filled 2017-08-08: qty 50

## 2017-08-08 MED ORDER — HYDROCODONE-ACETAMINOPHEN 7.5-325 MG/15ML PO SOLN
10.0000 mL | ORAL | Status: DC | PRN
  Administered 2017-08-08 (×2): 10 mL via ORAL
  Filled 2017-08-08 (×2): qty 15

## 2017-08-08 NOTE — Progress Notes (Signed)
Pt ambulating in hall with his wife. Was able to tolerating po's well today.  management has been the focus of the ay for this patient.

## 2017-08-08 NOTE — Progress Notes (Addendum)
PROGRESS NOTE    Trevor Zavala  XTK:[id number] DOB: 1966/10/23 DOA: 08/07/2017 PCP: Cassandria Anger, MD     Brief Narrative:  Trevor Zavala is a 51 y.o. male with medical history significant of stage-III squamous cell lung cancer, squamous cell cancer of the epiglottis, currently on chemotherapy and radiation therapy, hypertension, COPD, GERD, depression, anxiety, tobacco abuse, who presents with dysphagia, generalized weakness and SOB. Patient states that he has dysphagia because of epiglottis cancer and radiation. He can only eat little liquid food, and has significantly decreased appetite and oral intake recently. He states that he is scheduled to have PEG tube placed next week as outpatient.   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 Julien Nordmann  COPD -Stable, not in acute exacerbation -Denies SOB this morning   GERD -IV pepcid  Generalized anxiety disorder -Continue home klonopin  HTN -Hydralazine prn IV   Hypokalemia -Replaced, trend   Leukopenia -Monitor   Tobacco abuse -Nicotine patch, cessation counseling     DVT prophylaxis: SCD Code Status: Full Family Communication: No family at bedside Disposition Plan: Pending improvement, PEG    Consultants:    IR  Procedures:   None  Antimicrobials:  Anti-infectives    None       Subjective: Patient had complained of severe pain in his throat earlier this morning. Pain medication was increased and feeling much better now. He states that he can only drink some liquids, but has had poor appetite as well as odynophagia. He denies any other complaints, no chest pain or shortness of breath, some nausea but no vomiting, and no abdominal pain.  Objective: Vitals:   08/08/17 0300 08/08/17 0430 08/08/17 0600 08/08/17 0657  BP: (!) 145/79 121/74 112/73 (!) 147/88  Pulse: 71 73 70 77  Resp: 17 18 14 18   Temp:    97.7 F (36.5 C)  TempSrc:    Oral  SpO2: 96% 92% 97% 100%  Weight:    69.7 kg (153 lb 9.6 oz)  Height:    5\' 11"  (1.803 m)    Intake/Output Summary (Last 24 hours) at 08/08/17 1315 Last data filed at 08/08/17 0038  Gross per 24 hour  Intake             2500 ml  Output                0 ml  Net             2500 ml   Filed Weights   08/07/17 1801 08/08/17 0657  Weight: 68 kg (150 lb) 69.7 kg (153 lb 9.6 oz)    Examination:  General exam: Appears calm and comfortable  HEENT: skin changes on neck and chest due to radiation, voice is hoarse  Respiratory system: Clear to auscultation. Respiratory effort normal. Cardiovascular  system: S1 & S2 heard, RRR. No JVD, murmurs, rubs, gallops or clicks. No pedal edema. Gastrointestinal system: Abdomen is nondistended, soft and nontender. No organomegaly or masses felt. Normal bowel sounds heard. Central nervous system: Alert and oriented. No focal neurological deficits. Extremities: Symmetric 5 x 5 power. Skin: No rashes, lesions or ulcers Psychiatry: Judgement and insight appear normal. Mood & affect appropriate.   Data Reviewed: I have personally reviewed following labs and imaging studies  CBC:  Recent Labs Lab 08/03/17 0757 08/07/17 1833 08/08/17 0436  WBC 3.6* 2.4* 1.5*  NEUTROABS 2.7 2.0  --   HGB 10.9* 11.3* 9.5*   HCT 32.6* 33.1* 27.3*  MCV 96.2 93.8 92.2  PLT 200 184 354   Basic Metabolic Panel:  Recent Labs Lab 08/03/17 0757 08/07/17 1833 08/08/17 0436  NA 136 140 142  K 4.0 3.2* 3.5  CL  --  104 109  CO2 30* 26 27  GLUCOSE 145* 87 80  BUN 9.4 13 9   CREATININE 0.7 0.49* 0.44*  CALCIUM 9.8 9.0 8.5*  MG  --   --  1.9   GFR: Estimated Creatinine Clearance: 106.5 mL/min (A) (by C-G formula based on SCr of 0.44 mg/dL (L)). Liver Function Tests:  Recent Labs Lab 08/03/17 0757 08/07/17 1833  AST 14 20  ALT 23 43  ALKPHOS 69 58  BILITOT 0.54 1.0  PROT 6.6 6.1*  ALBUMIN 3.0* 3.1*   No results for input(s): LIPASE, AMYLASE in the last 168 hours. No results for input(s): AMMONIA in the last 168 hours. Coagulation Profile:  Recent Labs Lab 08/07/17 2327  INR 1.13   Cardiac Enzymes: No results for input(s): CKTOTAL, CKMB, CKMBINDEX, TROPONINI in the last 168 hours. BNP (last 3 results) No results for input(s): PROBNP in the last 8760 hours. HbA1C: No results for input(s): HGBA1C in the last 72 hours. CBG:  Recent Labs Lab 08/08/17 0911  GLUCAP 82   Lipid Profile: No results for input(s): CHOL, HDL, LDLCALC, TRIG, CHOLHDL, LDLDIRECT in the last 72 hours. Thyroid Function Tests: No results for input(s): TSH, T4TOTAL, FREET4, T3FREE, THYROIDAB in the last 72 hours. Anemia Panel: No results for input(s): VITAMINB12, FOLATE, FERRITIN, TIBC, IRON, RETICCTPCT in the last 72 hours. Sepsis Labs:  Recent Labs Lab 08/07/17 1845  LATICACIDVEN 0.49*    No results found for this or any previous visit (from the past 240 hour(s)).     Radiology Studies: Dg Chest 2 View  Result Date: 08/07/2017 CLINICAL DATA:  Productive cough. Shortness of breath with exertion. Currently undergoing chemotherapy and radiation therapy for throat and lung cancer. Ex-smoker. EXAM: CHEST  2 VIEW COMPARISON:  07/01/2017. FINDINGS: Normal with sized heart. Clear lungs. Right jugular porta  catheter tip at the junction of the superior vena cava and right atrium. Thoracic spine degenerative changes. IMPRESSION: No acute abnormality. Electronically Signed   By: Claudie Revering M.D.   On: 08/07/2017 18:30   Ct Soft Tissue Neck W Contrast  Result Date: 08/07/2017 CLINICAL DATA:  Shortness of breath and neck swelling after radiation. History of lung cancer. EXAM: CT NECK WITH CONTRAST TECHNIQUE: Multidetector CT imaging of the neck was performed using the standard protocol following the bolus administration of intravenous contrast. CONTRAST:  100 cc Isovue 370 COMPARISON:  PET- CT June 10, 2017 FINDINGS: PHARYNX AND LARYNX: Mild late data/infiltrated paraglottic fat with thickened edematous epiglottis. Patent airway. Normal larynx. SALIVARY GLANDS: Normal. THYROID: Normal. LYMPH NODES: 13 mm LEFT supraclavicular lymph node was 16 mm. VASCULAR: Normal.  LIMITED INTRACRANIAL: Normal. VISUALIZED ORBITS: Normal. MASTOIDS AND VISUALIZED PARANASAL SINUSES: Small LEFT maxillary sinus mucosal retention cyst. SKELETON: Nonacute.  RIGHT chest Port-A-Cath. UPPER CHEST: Please see CT chest from same day, reported separately for dedicated findings. OTHER: None. IMPRESSION: 1. Mild post radiation supraglottic edema, patent airway. 2. Smaller pathologic LEFT supraclavicular lymph node. Electronically Signed   By: Elon Alas M.D.   On: 08/07/2017 21:52   Ct Angio Chest Pe W And/or Wo Contrast  Result Date: 08/07/2017 CLINICAL DATA:  PE suspected, high pretest prob. Shortness of breath and neck swelling. Postradiation, stage III squamous cell carcinoma of left lung. EXAM: CT ANGIOGRAPHY CHEST WITH CONTRAST TECHNIQUE: Multidetector CT imaging of the chest was performed using the standard protocol during bolus administration of intravenous contrast. Multiplanar CT image reconstructions and MIPs were obtained to evaluate the vascular anatomy. CONTRAST:  100 cc Isovue 370 IV COMPARISON:  Chest radiograph earlier  this day. PET-CT 06/10/2017. Chest CT PE protocol 06/06/2017 FINDINGS: Cardiovascular: Stable encasement of the left pulmonary artery at the left hilum without filling defects to suggest pulmonary embolus. Small caliber left upper lobe pulmonary vessels secondary to encasement. No filling defects throughout the right pulmonary arteries to suggest pulmonary embolus. The heart is normal in size. Normal caliber thoracic aorta with mild atherosclerosis. Right chest port in place. No evidence of IVC thrombus. No pericardial effusion. Mediastinum/Nodes: Left hilar mass (measurement includes the left pulmonary artery) measures 5.4 x 5.3 cm, slight decrease from prior PET using same measurement technique. This encases the left pulmonary artery and left upper lobe bronchus. Left supraclavicular node measures 12 mm previously 16 mm. Decreased size of left paratracheal node from prior PET. No new mediastinal or hilar adenopathy. The hypermetabolic right infrahilar node on prior PET is normal in size. Lungs/Pleura: Mild emphysema. The previous 12 mm spiculated right upper lobe nodule has resolved with faint scarring and cystic focus image 32 series 12. Improving postobstructive pneumonitis in the lingula and left upper lobe. 5 mm faint left upper lobe nodule image 23 series 12, and decreased from prior exam. No new pulmonary nodule or focal airspace disease. There is bubbly debris within the left mainstem bronchi and right bronchus intermedius. No pleural fluid. Upper Abdomen: No acute finding.  No adrenal nodule. Musculoskeletal: There are no acute or suspicious osseous abnormalities. No focal bone lesion. Review of the MIP images confirms the above findings. IMPRESSION: 1. No pulmonary embolus. 2. Left hilar mass has decreased in size, persistent encasement of the left pulmonary artery. Decreased postobstructive pneumonitis in the left upper lobe. 3. Previous spiculated right upper lobe 12 mm nodule has resolved with minimal  residual scarring/ cystic focus. 4. Left supraclavicular node has decreased in size. 5. Bubbly debris within the left mainstem bronchus and right bronchus intermedius suggesting mucus. Aortic Atherosclerosis (ICD10-I70.0) and Emphysema (ICD10-J43.9). Electronically Signed   By: Jeb Levering M.D.   On: 08/07/2017 21:58      Scheduled Meds: . fentaNYL  25 mcg Transdermal Q72H  . fluticasone furoate-vilanterol  1 puff Inhalation Daily  . heparin lock flush  500 Units Intracatheter Once  . morphine  15 mg Oral BID  . nicotine  21 mg Transdermal Daily   Continuous Infusions: . sodium chloride 125 mL/hr at 08/08/17 0927  . famotidine (PEPCID) IV       LOS: 1 day    Time spent: 40 minutes   Dessa Phi, DO [facility] [email or web address] Password [email or web address] 08/08/2017, 1:15 PM

## 2017-08-08 NOTE — Progress Notes (Signed)
IR does not place g-tube's over the weekend.  He is currently set up for outpatient g-tube placement on Wednesday 10-3.  We will evaluate him while he is inpatient to see if it can be placed sometime early in the week.  Trevor Zavala E

## 2017-08-09 ENCOUNTER — Encounter (HOSPITAL_COMMUNITY): Payer: Self-pay | Admitting: General Surgery

## 2017-08-09 DIAGNOSIS — E43 Unspecified severe protein-calorie malnutrition: Secondary | ICD-10-CM | POA: Insufficient documentation

## 2017-08-09 LAB — CBC WITH DIFFERENTIAL/PLATELET
BASOS PCT: 0 %
Basophils Absolute: 0 10*3/uL (ref 0.0–0.1)
EOS PCT: 1 %
Eosinophils Absolute: 0 10*3/uL (ref 0.0–0.7)
HCT: 25.1 % — ABNORMAL LOW (ref 39.0–52.0)
Hemoglobin: 8.8 g/dL — ABNORMAL LOW (ref 13.0–17.0)
LYMPHS ABS: 0.2 10*3/uL — AB (ref 0.7–4.0)
LYMPHS PCT: 18 %
MCH: 32.1 pg (ref 26.0–34.0)
MCHC: 35.1 g/dL (ref 30.0–36.0)
MCV: 91.6 fL (ref 78.0–100.0)
MONO ABS: 0.1 10*3/uL (ref 0.1–1.0)
Monocytes Relative: 8 %
NEUTROS PCT: 73 %
Neutro Abs: 1 10*3/uL — ABNORMAL LOW (ref 1.7–7.7)
PLATELETS: 174 10*3/uL (ref 150–400)
RBC: 2.74 MIL/uL — ABNORMAL LOW (ref 4.22–5.81)
RDW: 13.3 % (ref 11.5–15.5)
WBC: 1.3 10*3/uL — AB (ref 4.0–10.5)

## 2017-08-09 LAB — GLUCOSE, CAPILLARY: GLUCOSE-CAPILLARY: 86 mg/dL (ref 65–99)

## 2017-08-09 LAB — MAGNESIUM: MAGNESIUM: 1.6 mg/dL — AB (ref 1.7–2.4)

## 2017-08-09 LAB — BASIC METABOLIC PANEL
ANION GAP: 6 (ref 5–15)
BUN: <5 mg/dL — ABNORMAL LOW (ref 6–20)
CHLORIDE: 104 mmol/L (ref 101–111)
CO2: 27 mmol/L (ref 22–32)
CREATININE: 0.39 mg/dL — AB (ref 0.61–1.24)
Calcium: 8 mg/dL — ABNORMAL LOW (ref 8.9–10.3)
GFR calc non Af Amer: >60 mL/min (ref >60)
Glucose, Bld: 89 mg/dL (ref 65–99)
POTASSIUM: 2.9 mmol/L — AB (ref 3.5–5.1)
SODIUM: 137 mmol/L (ref 135–145)

## 2017-08-09 MED ORDER — HYDROCODONE-ACETAMINOPHEN 7.5-325 MG/15ML PO SOLN
15.0000 mL | ORAL | Status: DC | PRN
  Administered 2017-08-09 – 2017-08-10 (×5): 15 mL via ORAL
  Filled 2017-08-09 (×6): qty 15

## 2017-08-09 MED ORDER — POLYETHYLENE GLYCOL 3350 17 G PO PACK
17.0000 g | PACK | Freq: Every day | ORAL | Status: DC
  Administered 2017-08-09 – 2017-08-16 (×6): 17 g via ORAL
  Filled 2017-08-09 (×8): qty 1

## 2017-08-09 MED ORDER — HYDROMORPHONE HCL-NACL 0.5-0.9 MG/ML-% IV SOSY
1.0000 mg | PREFILLED_SYRINGE | INTRAVENOUS | Status: DC | PRN
  Administered 2017-08-09 – 2017-08-10 (×3): 1 mg via INTRAVENOUS
  Filled 2017-08-09 (×3): qty 2

## 2017-08-09 MED ORDER — CEFAZOLIN SODIUM-DEXTROSE 2-4 GM/100ML-% IV SOLN
2.0000 g | INTRAVENOUS | Status: AC
  Administered 2017-08-10: 2 g via INTRAVENOUS
  Filled 2017-08-09 (×2): qty 100

## 2017-08-09 MED ORDER — FENTANYL CITRATE (PF) 100 MCG/2ML IJ SOLN
25.0000 ug | INTRAMUSCULAR | Status: DC | PRN
  Administered 2017-08-10: 50 ug via INTRAVENOUS
  Filled 2017-08-09: qty 2

## 2017-08-09 MED ORDER — SENNOSIDES 8.8 MG/5ML PO SYRP
5.0000 mL | ORAL_SOLUTION | Freq: Every day | ORAL | Status: DC
  Administered 2017-08-09 – 2017-08-13 (×4): 5 mL via ORAL
  Filled 2017-08-09 (×5): qty 5

## 2017-08-09 MED ORDER — POTASSIUM CHLORIDE 10 MEQ/100ML IV SOLN
10.0000 meq | INTRAVENOUS | Status: AC
  Administered 2017-08-09 (×6): 10 meq via INTRAVENOUS
  Filled 2017-08-09 (×6): qty 100

## 2017-08-09 MED ORDER — MAGNESIUM SULFATE 2 GM/50ML IV SOLN
2.0000 g | Freq: Once | INTRAVENOUS | Status: AC
  Administered 2017-08-09: 2 g via INTRAVENOUS
  Filled 2017-08-09: qty 50

## 2017-08-09 MED ORDER — BOOST / RESOURCE BREEZE PO LIQD
1.0000 | Freq: Three times a day (TID) | ORAL | Status: DC
  Administered 2017-08-09 – 2017-08-15 (×11): 1 via ORAL

## 2017-08-09 MED ORDER — BISACODYL 10 MG RE SUPP: 10.0000 mg | Freq: Every day | RECTAL | Status: DC | PRN

## 2017-08-09 MED ORDER — SODIUM CHLORIDE 0.9% FLUSH
10.0000 mL | INTRAVENOUS | Status: DC | PRN
  Administered 2017-08-11 – 2017-08-12 (×3): 10 mL
  Filled 2017-08-09 (×3): qty 40

## 2017-08-09 MED ORDER — ADULT MULTIVITAMIN LIQUID CH
15.0000 mL | Freq: Every day | ORAL | Status: DC
  Administered 2017-08-09 – 2017-08-14 (×5): 15 mL via ORAL
  Filled 2017-08-09 (×6): qty 15

## 2017-08-09 MED ORDER — FENTANYL 25 MCG/HR TD PT72
25.0000 ug | MEDICATED_PATCH | TRANSDERMAL | Status: DC
  Administered 2017-08-09: 25 ug via TRANSDERMAL
  Filled 2017-08-09: qty 1

## 2017-08-09 MED ORDER — POTASSIUM CHLORIDE 20 MEQ/15ML (10%) PO SOLN
40.0000 meq | ORAL | Status: DC
  Filled 2017-08-09: qty 30

## 2017-08-09 NOTE — Progress Notes (Signed)
Pt c/o throat pain 7/10 not relieved by current pain regimen. Next PRN pain medicine due at 0740 On call [place] notified. Will continue to monitor pt.

## 2017-08-09 NOTE — Progress Notes (Signed)
PROGRESS NOTE    Trevor Zavala  LOV:[id number] DOB: Oct 19, 1966 DOA: 08/07/2017 PCP: Cassandria Anger, MD     Brief Narrative:  Trevor Zavala is a 51 y.o. male with medical history significant of stage-III squamous cell lung cancer, squamous cell cancer of the epiglottis, currently on chemotherapy and radiation therapy, hypertension, COPD, GERD, depression, anxiety, tobacco abuse, who presents with dysphagia, generalized weakness and SOB. Patient states that he has dysphagia because of epiglottis cancer and radiation. He can only eat little liquid food, and has significantly decreased appetite and oral intake recently. He states that he is scheduled to have PEG tube placed next week as outpatient.   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 Julien Nordmann  COPD -Stable, not in acute exacerbation -Denies SOB this morning   GERD -IV pepcid  Generalized anxiety disorder -Continue home klonopin  HTN -Hydralazine prn IV   Hypokalemia -Replaced, trend   Hypomagnesemia -Replace, trend   Leukopenia -Monitor   Tobacco abuse -Nicotine patch, cessation counseling   Constipation -Bowel regimen     DVT prophylaxis: SCD Code Status: Full Family Communication: Wife at  bedside Disposition Plan: Pending improvement, PEG placement    Consultants:   IR  Procedures:   None  Antimicrobials:  Anti-infectives    Start     Dose/Rate Route Frequency Ordered Stop   08/10/17 0600  ceFAZolin (ANCEF) IVPB 2g/100 mL premix     2 g 200 mL/hr over 30 Minutes Intravenous To Radiology 08/09/17 0826 08/11/17 0600       Subjective: Patient's pain was controlled yesterday, able to ambulate in the halls and tolerate full liquid diet. However, overnight and early this morning, pain is uncontrolled. He admits to both dysphagia and odynophagia. He is wondering about his chemotherapy and radiation. That is scheduled for tomorrow as well as PEG placement, which is scheduled for Wednesday. Has not had a bowel movement since last Thursday.  Objective: Vitals:   08/08/17 2149 08/09/17 0553 08/09/17 1025 08/09/17 1027  BP: 122/64 128/77    Pulse: 70 80    Resp: 18 16    Temp: 98 F (36.7 C) 97.7 F (36.5 C)    TempSrc: Oral Oral    SpO2: 92% 91% 90% 90%  Weight:      Height:        Intake/Output Summary (Last 24 hours) at 08/09/17 1321 Last data filed at 08/09/17 0617  Gross per 24 hour  Intake          2535.42 ml  Output                0 ml  Net          2535.42 ml   Filed Weights   08/07/17 1801 08/08/17 0657  Weight: 68 kg (150  lb) 69.7 kg (153 lb 9.6 oz)    Examination:  General exam: Appears calm and comfortable  HEENT: skin changes on neck and chest due to radiation, voice is hoarse  Respiratory system: Clear to auscultation. Respiratory effort normal. Cardiovascular system: S1 & S2 heard, RRR. No JVD, murmurs, rubs, gallops or clicks. No pedal edema. Gastrointestinal system: Abdomen is nondistended, soft and nontender. No organomegaly or masses felt. Normal bowel sounds heard. Central nervous system: Alert and oriented. No focal neurological deficits. Extremities: Symmetric 5 x 5 power. Skin: No rashes, lesions or ulcers Psychiatry: Judgement  and insight appear normal. Mood & affect appropriate.   Data Reviewed: I have personally reviewed following labs and imaging studies  CBC:  Recent Labs Lab 08/03/17 0757 08/07/17 1833 08/08/17 0436 08/09/17 1030  WBC 3.6* 2.4* 1.5* 1.3*  NEUTROABS 2.7 2.0  --  1.0*  HGB 10.9* 11.3* 9.5* 8.8*  HCT 32.6* 33.1* 27.3* 25.1*  MCV 96.2 93.8 92.2 91.6  PLT 200 184 181 093   Basic Metabolic Panel:  Recent Labs Lab 08/03/17 0757 08/07/17 1833 08/08/17 0436 08/09/17 1030  NA 136 140 142 137  K 4.0 3.2* 3.5 2.9*  CL  --  104 109 104  CO2 30* 26 27 27   GLUCOSE 145* 87 80 89  BUN 9.4 13 9  <5*  CREATININE 0.7 0.49* 0.44* 0.39*  CALCIUM 9.8 9.0 8.5* 8.0*  MG  --   --  1.9 1.6*   GFR: Estimated Creatinine Clearance: 106.5 mL/min (A) (by C-G formula based on SCr of 0.39 mg/dL (L)). Liver Function Tests:  Recent Labs Lab 08/03/17 0757 08/07/17 1833  AST 14 20  ALT 23 43  ALKPHOS 69 58  BILITOT 0.54 1.0  PROT 6.6 6.1*  ALBUMIN 3.0* 3.1*   No results for input(s): LIPASE, AMYLASE in the last 168 hours. No results for input(s): AMMONIA in the last 168 hours. Coagulation Profile:  Recent Labs Lab 08/07/17 2327  INR 1.13   Cardiac Enzymes: No results for input(s): CKTOTAL, CKMB, CKMBINDEX, TROPONINI in the last 168 hours. BNP (last 3 results) No results for input(s): PROBNP in the last 8760 hours. HbA1C: No results for input(s): HGBA1C in the last 72 hours. CBG:  Recent Labs Lab 08/08/17 0911 08/09/17 0742  GLUCAP 82 86   Lipid Profile: No results for input(s): CHOL, HDL, LDLCALC, TRIG, CHOLHDL, LDLDIRECT in the last 72 hours. Thyroid Function Tests: No results for input(s): TSH, T4TOTAL, FREET4, T3FREE, THYROIDAB in the last 72 hours. Anemia Panel: No results for input(s): VITAMINB12, FOLATE, FERRITIN, TIBC, IRON, RETICCTPCT in the last 72 hours. Sepsis Labs:  Recent Labs Lab 08/07/17 1845  LATICACIDVEN 0.49*    No results found for this or any  previous visit (from the past 240 hour(s)).     Radiology Studies: Dg Chest 2 View  Result Date: 08/07/2017 CLINICAL DATA:  Productive cough. Shortness of breath with exertion. Currently undergoing chemotherapy and radiation therapy for throat and lung cancer. Ex-smoker. EXAM: CHEST  2 VIEW COMPARISON:  07/01/2017. FINDINGS: Normal with sized heart. Clear lungs. Right jugular porta catheter tip at the junction of the superior vena cava and right atrium. Thoracic spine degenerative changes. IMPRESSION: No acute abnormality. Electronically Signed   By: Claudie Revering M.D.   On: 08/07/2017 18:30   Ct Soft Tissue Neck W Contrast  Result Date: 08/07/2017 CLINICAL DATA:  Shortness of breath and neck swelling after radiation. History of lung cancer. EXAM: CT NECK WITH CONTRAST TECHNIQUE: Multidetector CT imaging  of the neck was performed using the standard protocol following the bolus administration of intravenous contrast. CONTRAST:  100 cc Isovue 370 COMPARISON:  PET- CT June 10, 2017 FINDINGS: PHARYNX AND LARYNX: Mild late data/infiltrated paraglottic fat with thickened edematous epiglottis. Patent airway. Normal larynx. SALIVARY GLANDS: Normal. THYROID: Normal. LYMPH NODES: 13 mm LEFT supraclavicular lymph node was 16 mm. VASCULAR: Normal. LIMITED INTRACRANIAL: Normal. VISUALIZED ORBITS: Normal. MASTOIDS AND VISUALIZED PARANASAL SINUSES: Small LEFT maxillary sinus mucosal retention cyst. SKELETON: Nonacute.  RIGHT chest Port-A-Cath. UPPER CHEST: Please see CT chest from same day, reported separately for dedicated findings. OTHER: None. IMPRESSION: 1. Mild post radiation supraglottic edema, patent airway. 2. Smaller pathologic LEFT supraclavicular lymph node. Electronically Signed   By: Elon Alas M.D.   On: 08/07/2017 21:52   Ct Angio Chest Pe W And/or Wo Contrast  Result Date: 08/07/2017 CLINICAL DATA:  PE suspected, high pretest prob. Shortness of breath and neck swelling. Postradiation, stage  III squamous cell carcinoma of left lung. EXAM: CT ANGIOGRAPHY CHEST WITH CONTRAST TECHNIQUE: Multidetector CT imaging of the chest was performed using the standard protocol during bolus administration of intravenous contrast. Multiplanar CT image reconstructions and MIPs were obtained to evaluate the vascular anatomy. CONTRAST:  100 cc Isovue 370 IV COMPARISON:  Chest radiograph earlier this day. PET-CT 06/10/2017. Chest CT PE protocol 06/06/2017 FINDINGS: Cardiovascular: Stable encasement of the left pulmonary artery at the left hilum without filling defects to suggest pulmonary embolus. Small caliber left upper lobe pulmonary vessels secondary to encasement. No filling defects throughout the right pulmonary arteries to suggest pulmonary embolus. The heart is normal in size. Normal caliber thoracic aorta with mild atherosclerosis. Right chest port in place. No evidence of IVC thrombus. No pericardial effusion. Mediastinum/Nodes: Left hilar mass (measurement includes the left pulmonary artery) measures 5.4 x 5.3 cm, slight decrease from prior PET using same measurement technique. This encases the left pulmonary artery and left upper lobe bronchus. Left supraclavicular node measures 12 mm previously 16 mm. Decreased size of left paratracheal node from prior PET. No new mediastinal or hilar adenopathy. The hypermetabolic right infrahilar node on prior PET is normal in size. Lungs/Pleura: Mild emphysema. The previous 12 mm spiculated right upper lobe nodule has resolved with faint scarring and cystic focus image 32 series 12. Improving postobstructive pneumonitis in the lingula and left upper lobe. 5 mm faint left upper lobe nodule image 23 series 12, and decreased from prior exam. No new pulmonary nodule or focal airspace disease. There is bubbly debris within the left mainstem bronchi and right bronchus intermedius. No pleural fluid. Upper Abdomen: No acute finding.  No adrenal nodule. Musculoskeletal: There are no  acute or suspicious osseous abnormalities. No focal bone lesion. Review of the MIP images confirms the above findings. IMPRESSION: 1. No pulmonary embolus. 2. Left hilar mass has decreased in size, persistent encasement of the left pulmonary artery. Decreased postobstructive pneumonitis in the left upper lobe. 3. Previous spiculated right upper lobe 12 mm nodule has resolved with minimal residual scarring/ cystic focus. 4. Left supraclavicular node has decreased in size. 5. Bubbly debris within the left mainstem bronchus and right bronchus intermedius suggesting mucus. Aortic Atherosclerosis (ICD10-I70.0) and Emphysema (ICD10-J43.9). Electronically Signed   By: Jeb Levering M.D.   On: 08/07/2017 21:58      Scheduled Meds: . fentaNYL  25 mcg Transdermal Q72H  . fluticasone furoate-vilanterol  1 puff Inhalation Daily  . heparin lock flush  500 Units Intracatheter Once  . morphine  15 mg Oral BID  . nicotine  21 mg Transdermal Daily  . polyethylene glycol  17 g Oral Daily  . sennosides  5 mL Oral QHS   Continuous Infusions: . sodium chloride 125 mL/hr at 08/08/17 2352  . [START ON 08/10/2017]  ceFAZolin (ANCEF) IV    . famotidine (PEPCID) IV       LOS: 2 days    Time spent: 30 minutes   Dessa Phi, DO [facility] [email or web address] Password [email or web address] 08/09/2017, 1:21 PM

## 2017-08-09 NOTE — Consult Note (Signed)
Chief Complaint: radiation esophagitis  Referring Physician:Dr. Dessa Phi  Supervising Physician: Sandi Mariscal  Patient Status: [facility] - In-pt  HPI: Trevor Zavala is a 51 y.o. male with a history of SCC of left lung and epiglottis.  He is undergoing chemotherapy and radiation therapy.  He was scheduled for g-tube placement on 10/3; however, was admitted this weekend for radiation esophagitis and significant pain with eating.  IR has been requested to place a g-tube while he is here as an inpatient.    Past Medical History:  Past Medical History:  Diagnosis Date  . Anxiety   . COPD (chronic obstructive pulmonary disease) ([place])   . GERD (gastroesophageal reflux disease)   . Headache   . Hypertension   . Pneumonia   . Situational depression   . Stage III squamous cell carcinoma of left lung ([place]) 06/19/2017    Past Surgical History:  Past Surgical History:  Procedure Laterality Date  . COLONOSCOPY    . DIRECT LARYNGOSCOPY N/A 06/25/2017   Procedure: DIRECT LARYNGOSCOPY AND BIOPSY;  Surgeon: Rozetta Nunnery, MD;  Location: [place];  Service: ENT;  Laterality: N/A;  . EAR CYST EXCISION N/A 11/17/2013   Procedure: SEBACEOUS CYST CHEST;  Surgeon: Joyice Faster. Cornett, MD;  Location: [place];  Service: General;  Laterality: N/A;  . KNEE ARTHROSCOPY     LEFT  . LIPOMA EXCISION N/A 11/17/2013   Procedure: EXCISION LIPOMA FOREHEAD;  Surgeon: Joyice Faster. Cornett, MD;  Location: [place];  Service: General;  Laterality: N/A;  . LUNG BIOPSY Bilateral 06/12/2017   Procedure: LEFT LUNG BIOPSY;  Surgeon: Grace Isaac, MD;  Location: [place];  Service: Thoracic;  Laterality: Bilateral;  . PORTACATH PLACEMENT Right 07/01/2017   Procedure: INSERTION PORT-A-CATH - RIGHT IJ - placed with Fluoro and Ultrasound;  Surgeon: Grace Isaac, MD;  Location: [place];  Service: Thoracic;  Laterality: Right;  Marland Kitchen VIDEO BRONCHOSCOPY WITH ENDOBRONCHIAL  ULTRASOUND N/A 06/12/2017   Procedure: VIDEO BRONCHOSCOPY WITH ENDOBRONCHIAL ULTRASOUND;  Surgeon: Grace Isaac, MD;  Location: [facility] OR;  Service: Thoracic;  Laterality: N/A;    Family History:  Family History  Problem Relation Age of Onset  . Breast cancer Mother   . Hypertension Mother   . Colon cancer Mother   . Skin cancer Father   . Hypertension Father   . Colon polyps Father   . Cancer Father 59       b cell lymphoma  . Colon cancer Father     Social History:  reports that he has been smoking.  He has a 8.75 pack-year smoking history. He has never used smokeless tobacco. He reports that he drinks alcohol. He reports that he does not use drugs.  Allergies: No Known Allergies  Medications: Medications reviewed in epic  Please HPI for pertinent positives, otherwise complete 10 system ROS negative.  Mallampati Score: MD Evaluation Airway: WNL Heart: WNL Abdomen: WNL Chest/ Lungs: WNL ASA  Classification: 3 Mallampati/Airway Score: One  Physical Exam: BP 128/77 (BP Location: Left Arm)   Pulse 80   Temp 97.7 F (36.5 C) (Oral)   Resp 16   Ht 5' 11"  (1.803 m)   Wt 153 lb 9.6 oz (69.7 kg)   SpO2 90%   BMI 21.42 kg/m  Body mass index is 21.42 kg/m. General: pleasant, WD, WN white male who is laying in bed in NAD HEENT: head is normocephalic, atraumatic.  Sclera are noninjected.  PERRL.  Ears and nose without any masses or lesions.  Mouth is pink and moist  Heart: regular, rate, and rhythm.  Normal s1,s2. No obvious murmurs, gallops, or rubs noted.  Palpable radial pulses bilaterally Lungs: diffuse wheezing.  Respiratory effort nonlabored Abd: soft, NT, ND, +BS, no masses, hernias, or organomegaly Psych: A&Ox3 with an appropriate affect.   Labs: Results for orders placed or performed during the hospital encounter of 08/07/17 (from the past 48 hour(s))  CBC with Differential/Platelet     Status: Abnormal   Collection Time: 08/07/17  6:33 PM  Result Value Ref  Range   WBC 2.4 (L) 4.0 - 10.5 K/uL   RBC 3.53 (L) 4.22 - 5.81 MIL/uL   Hemoglobin 11.3 (L) 13.0 - 17.0 g/dL   HCT 33.1 (L) 39.0 - 52.0 %   MCV 93.8 78.0 - 100.0 fL   MCH 32.0 26.0 - 34.0 pg   MCHC 34.1 30.0 - 36.0 g/dL   RDW 13.5 11.5 - 15.5 %   Platelets 184 150 - 400 K/uL   Neutrophils Relative % 81 %   Neutro Abs 2.0 1.7 - 7.7 K/uL   Lymphocytes Relative 14 %   Lymphs Abs 0.3 (L) 0.7 - 4.0 K/uL   Monocytes Relative 4 %   Monocytes Absolute 0.1 0.1 - 1.0 K/uL   Eosinophils Relative 1 %   Eosinophils Absolute 0.0 0.0 - 0.7 K/uL   Basophils Relative 0 %   Basophils Absolute 0.0 0.0 - 0.1 K/uL  Comprehensive metabolic panel     Status: Abnormal   Collection Time: 08/07/17  6:33 PM  Result Value Ref Range   Sodium 140 135 - 145 mmol/L   Potassium 3.2 (L) 3.5 - 5.1 mmol/L   Chloride 104 101 - 111 mmol/L   CO2 26 22 - 32 mmol/L   Glucose, Bld 87 65 - 99 mg/dL   BUN 13 6 - 20 mg/dL   Creatinine, Ser 0.49 (L) 0.61 - 1.24 mg/dL   Calcium 9.0 8.9 - 10.3 mg/dL   Total Protein 6.1 (L) 6.5 - 8.1 g/dL   Albumin 3.1 (L) 3.5 - 5.0 g/dL   AST 20 15 - 41 U/L   ALT 43 17 - 63 U/L   Alkaline Phosphatase 58 38 - 126 U/L   Total Bilirubin 1.0 0.3 - 1.2 mg/dL   GFR calc non Af Amer >60 >60 mL/min   GFR calc Af Amer >60 >60 mL/min    Comment: (NOTE) The eGFR has been calculated using the CKD EPI equation. This calculation has not been validated in all clinical situations. eGFR's persistently <60 mL/min signify possible Chronic Kidney Disease.    Anion gap 10 5 - 15  I-Stat CG4 Lactic Acid, ED     Status: Abnormal   Collection Time: 08/07/17  6:45 PM  Result Value Ref Range   Lactic Acid, Venous 0.49 (L) 0.5 - 1.9 mmol/L  Protime-INR     Status: None   Collection Time: 08/07/17 11:27 PM  Result Value Ref Range   Prothrombin Time 14.4 11.4 - 15.2 seconds   INR 1.13   APTT     Status: None   Collection Time: 08/07/17 11:27 PM  Result Value Ref Range   aPTT 31 24 - 36 seconds    Magnesium     Status: None   Collection Time: 08/08/17  4:36 AM  Result Value Ref Range   Magnesium 1.9 1.7 - 2.4 mg/dL  Basic metabolic panel  Status: Abnormal   Collection Time: 08/08/17  4:36 AM  Result Value Ref Range   Sodium 142 135 - 145 mmol/L   Potassium 3.5 3.5 - 5.1 mmol/L   Chloride 109 101 - 111 mmol/L   CO2 27 22 - 32 mmol/L   Glucose, Bld 80 65 - 99 mg/dL   BUN 9 6 - 20 mg/dL   Creatinine, Ser 0.44 (L) 0.61 - 1.24 mg/dL   Calcium 8.5 (L) 8.9 - 10.3 mg/dL   GFR calc non Af Amer >60 >60 mL/min   GFR calc Af Amer >60 >60 mL/min    Comment: (NOTE) The eGFR has been calculated using the CKD EPI equation. This calculation has not been validated in all clinical situations. eGFR's persistently <60 mL/min signify possible Chronic Kidney Disease.    Anion gap 6 5 - 15  CBC     Status: Abnormal   Collection Time: 08/08/17  4:36 AM  Result Value Ref Range   WBC 1.5 (L) 4.0 - 10.5 K/uL   RBC 2.96 (L) 4.22 - 5.81 MIL/uL   Hemoglobin 9.5 (L) 13.0 - 17.0 g/dL   HCT 27.3 (L) 39.0 - 52.0 %   MCV 92.2 78.0 - 100.0 fL   MCH 32.1 26.0 - 34.0 pg   MCHC 34.8 30.0 - 36.0 g/dL   RDW 13.3 11.5 - 15.5 %   Platelets 181 150 - 400 K/uL  Glucose, capillary     Status: None   Collection Time: 08/08/17  9:11 AM  Result Value Ref Range   Glucose-Capillary 82 65 - 99 mg/dL   Comment 1 Notify RN   Glucose, capillary     Status: None   Collection Time: 08/09/17  7:42 AM  Result Value Ref Range   Glucose-Capillary 86 65 - 99 mg/dL  CBC with Differential/Platelet     Status: Abnormal   Collection Time: 08/09/17 10:30 AM  Result Value Ref Range   WBC 1.3 (LL) 4.0 - 10.5 K/uL    Comment: REPEATED TO VERIFY CRITICAL RESULT CALLED TO, READ BACK BY AND VERIFIED WITH: M.SEARS,RN [id number] @1116  BY V.WILKINS    RBC 2.74 (L) 4.22 - 5.81 MIL/uL   Hemoglobin 8.8 (L) 13.0 - 17.0 g/dL   HCT 25.1 (L) 39.0 - 52.0 %   MCV 91.6 78.0 - 100.0 fL   MCH 32.1 26.0 - 34.0 pg   MCHC 35.1 30.0 - 36.0  g/dL   RDW 13.3 11.5 - 15.5 %   Platelets 174 150 - 400 K/uL   Neutrophils Relative % 73 %   Lymphocytes Relative 18 %   Monocytes Relative 8 %   Eosinophils Relative 1 %   Basophils Relative 0 %   Neutro Abs 1.0 (L) 1.7 - 7.7 K/uL   Lymphs Abs 0.2 (L) 0.7 - 4.0 K/uL   Monocytes Absolute 0.1 0.1 - 1.0 K/uL   Eosinophils Absolute 0.0 0.0 - 0.7 K/uL   Basophils Absolute 0.0 0.0 - 0.1 K/uL   Smear Review MORPHOLOGY UNREMARKABLE   Basic metabolic panel     Status: Abnormal   Collection Time: 08/09/17 10:30 AM  Result Value Ref Range   Sodium 137 135 - 145 mmol/L   Potassium 2.9 (L) 3.5 - 5.1 mmol/L    Comment: DELTA CHECK NOTED   Chloride 104 101 - 111 mmol/L   CO2 27 22 - 32 mmol/L   Glucose, Bld 89 65 - 99 mg/dL   BUN <5 (L) 6 - 20 mg/dL   Creatinine, Ser  0.39 (L) 0.61 - 1.24 mg/dL   Calcium 8.0 (L) 8.9 - 10.3 mg/dL   GFR calc non Af Amer >60 >60 mL/min   GFR calc Af Amer >60 >60 mL/min    Comment: (NOTE) The eGFR has been calculated using the CKD EPI equation. This calculation has not been validated in all clinical situations. eGFR's persistently <60 mL/min signify possible Chronic Kidney Disease.    Anion gap 6 5 - 15  Magnesium     Status: Abnormal   Collection Time: 08/09/17 10:30 AM  Result Value Ref Range   Magnesium 1.6 (L) 1.7 - 2.4 mg/dL    Imaging: Dg Chest 2 View  Result Date: 08/07/2017 CLINICAL DATA:  Productive cough. Shortness of breath with exertion. Currently undergoing chemotherapy and radiation therapy for throat and lung cancer. Ex-smoker. EXAM: CHEST  2 VIEW COMPARISON:  07/01/2017. FINDINGS: Normal with sized heart. Clear lungs. Right jugular porta catheter tip at the junction of the superior vena cava and right atrium. Thoracic spine degenerative changes. IMPRESSION: No acute abnormality. Electronically Signed   By: Claudie Revering M.D.   On: 08/07/2017 18:30   Ct Soft Tissue Neck W Contrast  Result Date: 08/07/2017 CLINICAL DATA:  Shortness of breath  and neck swelling after radiation. History of lung cancer. EXAM: CT NECK WITH CONTRAST TECHNIQUE: Multidetector CT imaging of the neck was performed using the standard protocol following the bolus administration of intravenous contrast. CONTRAST:  100 cc Isovue 370 COMPARISON:  PET- CT June 10, 2017 FINDINGS: PHARYNX AND LARYNX: Mild late data/infiltrated paraglottic fat with thickened edematous epiglottis. Patent airway. Normal larynx. SALIVARY GLANDS: Normal. THYROID: Normal. LYMPH NODES: 13 mm LEFT supraclavicular lymph node was 16 mm. VASCULAR: Normal. LIMITED INTRACRANIAL: Normal. VISUALIZED ORBITS: Normal. MASTOIDS AND VISUALIZED PARANASAL SINUSES: Small LEFT maxillary sinus mucosal retention cyst. SKELETON: Nonacute.  RIGHT chest Port-A-Cath. UPPER CHEST: Please see CT chest from same day, reported separately for dedicated findings. OTHER: None. IMPRESSION: 1. Mild post radiation supraglottic edema, patent airway. 2. Smaller pathologic LEFT supraclavicular lymph node. Electronically Signed   By: Elon Alas M.D.   On: 08/07/2017 21:52   Ct Angio Chest Pe W And/or Wo Contrast  Result Date: 08/07/2017 CLINICAL DATA:  PE suspected, high pretest prob. Shortness of breath and neck swelling. Postradiation, stage III squamous cell carcinoma of left lung. EXAM: CT ANGIOGRAPHY CHEST WITH CONTRAST TECHNIQUE: Multidetector CT imaging of the chest was performed using the standard protocol during bolus administration of intravenous contrast. Multiplanar CT image reconstructions and MIPs were obtained to evaluate the vascular anatomy. CONTRAST:  100 cc Isovue 370 IV COMPARISON:  Chest radiograph earlier this day. PET-CT 06/10/2017. Chest CT PE protocol 06/06/2017 FINDINGS: Cardiovascular: Stable encasement of the left pulmonary artery at the left hilum without filling defects to suggest pulmonary embolus. Small caliber left upper lobe pulmonary vessels secondary to encasement. No filling defects throughout the  right pulmonary arteries to suggest pulmonary embolus. The heart is normal in size. Normal caliber thoracic aorta with mild atherosclerosis. Right chest port in place. No evidence of IVC thrombus. No pericardial effusion. Mediastinum/Nodes: Left hilar mass (measurement includes the left pulmonary artery) measures 5.4 x 5.3 cm, slight decrease from prior PET using same measurement technique. This encases the left pulmonary artery and left upper lobe bronchus. Left supraclavicular node measures 12 mm previously 16 mm. Decreased size of left paratracheal node from prior PET. No new mediastinal or hilar adenopathy. The hypermetabolic right infrahilar node on prior PET is normal in size.  Lungs/Pleura: Mild emphysema. The previous 12 mm spiculated right upper lobe nodule has resolved with faint scarring and cystic focus image 32 series 12. Improving postobstructive pneumonitis in the lingula and left upper lobe. 5 mm faint left upper lobe nodule image 23 series 12, and decreased from prior exam. No new pulmonary nodule or focal airspace disease. There is bubbly debris within the left mainstem bronchi and right bronchus intermedius. No pleural fluid. Upper Abdomen: No acute finding.  No adrenal nodule. Musculoskeletal: There are no acute or suspicious osseous abnormalities. No focal bone lesion. Review of the MIP images confirms the above findings. IMPRESSION: 1. No pulmonary embolus. 2. Left hilar mass has decreased in size, persistent encasement of the left pulmonary artery. Decreased postobstructive pneumonitis in the left upper lobe. 3. Previous spiculated right upper lobe 12 mm nodule has resolved with minimal residual scarring/ cystic focus. 4. Left supraclavicular node has decreased in size. 5. Bubbly debris within the left mainstem bronchus and right bronchus intermedius suggesting mucus. Aortic Atherosclerosis (ICD10-I70.0) and Emphysema (ICD10-J43.9). Electronically Signed   By: Jeb Levering M.D.   On:  08/07/2017 21:58    Assessment/Plan 1. Dysphagia, secondary to radiation esophagitis  We will plan for placement of a g-tube when scheduling will allow.  He is ready for Monday if able.  If not, then it may be Tuesday.  This has been discussed with the patient and his family.  They understand.  We also discussed that a g-tube will have to remain in placed for at least 6-8 weeks prior to removal.   Risks and benefits discussed with the patient including, but not limited to the need for a barium enema during the procedure, bleeding, infection, peritonitis, or damage to adjacent structures. All of the patient's questions were answered, patient is agreeable to proceed. Consent signed and in chart.  Thank you for this interesting consult.  I greatly enjoyed meeting Trevor Zavala and look forward to participating in their care.  A copy of this report was sent to the requesting provider on this date.  Electronically Signed: Henreitta Cea 08/09/2017, 2:57 PM   I spent a total of 40 Minutes    in face to face in clinical consultation, greater than 50% of which was counseling/coordinating care for radiation esophagitis, dysphagia

## 2017-08-09 NOTE — Progress Notes (Addendum)
Initial Nutrition Assessment  DOCUMENTATION CODES:   Severe malnutrition in context of acute illness/injury  INTERVENTION:   Recommend G-tube placement prior to discharge if possible- Can have IR placement of G-tube if PEG placement is not feasible     Pt is at moderate to high refeeding risk   Boost Breeze po TID, each supplement provides 250 kcal and 9 grams of protein  MVI  NUTRITION DIAGNOSIS:   Malnutrition (severe) related to cancer and cancer related treatments as evidenced by 7 percent weight loss in 1 month, energy intake < or equal to 50% for > or equal to 5 days.  GOAL:   Patient will meet greater than or equal to 90% of their needs  MONITOR:   PO intake, Supplement acceptance, Labs, Weight trends, Other (Comment) (G-tube placement )  REASON FOR ASSESSMENT:   Consult Assessment of nutrition requirement/status  ASSESSMENT:    51 y.o. male with medical history significant of stage-III squamous cell lung cancer, squamous cell cancer of the epiglottis, currently on chemotherapy and radiation therapy, hypertension, COPD, GERD, depression, anxiety, tobacco abuse, who presents with dysphagia, generalized weakness and SOB. Patient states that he has dysphagia because of epiglottis cancer and radiation. He can only eat little liquid food, and has significantly decreased appetite and oral intake recently. He states that he is scheduled to have PEG tube placed next week as outpatient.   Met with pt in room today. Pt reports good appetite but is unable to eat solid foods r/t pain from his cancer treatments. Pt reports that he has not really eaten well for several weeks; pt has been able to drink some soda and water. At one point, pt was drinking Ensure but he stopped because he felt like it was too thick in his throat and causing him to choke. Pt is willing to try Boost Breeze. He believes he can get this down. Pt will not be able to meet his estimated needs with Boost Breeze  alone. Recommend G-tube placement as soon as medically possible. Pt was scheduled to have PEG placement on 53/3 but it is not recommended to wait this long. Pt can have IR placement of G-tube if PEG is not feasible. Per chart, pt has lost 11lbs(7%) in 1 month; this is significant. Pt with hypokalemia and hypomagnesemia; monitor and supplement as needed per MD discretion. Pt is at moderate to high refeeding risk; recommend monitor Phosphorus after tube feed initiation. Pt scheduled to have chemo/XRT tomorrow.     Medications reviewed and include: fentanyl, heparin, morphine, nicotine, miralax, KCl, senokot, NaCl '@125ml'$ /hr, cefazolin, pepcid, Mg sulfate, hydrocodone   Labs reviewed: K 2.9(L), BUN <5(L), creat 0.39(L), Ca 8.0(L), Mg 1.6(L) Wbc- 1.3(L), Hgb 8.8(L), Hct 25.1(L)  Nutrition-Focused physical exam completed. Findings are no fat depletion, no muscle depletion, and no edema.   Diet Order:  Diet full liquid Room service appropriate? Yes; Fluid consistency: Thin Diet NPO time specified Except for: Sips with Meds  Skin:  Reviewed, no issues  Last BM:  9/27  Height:   Ht Readings from Last 1 Encounters:  08/08/17 '5\' 11"'$  (1.803 m)    Weight:   Wt Readings from Last 1 Encounters:  08/08/17 153 lb 9.6 oz (69.7 kg)    Ideal Body Weight:  78 kg  BMI:  Body mass index is 21.42 kg/m.  Estimated Nutritional Needs:   Kcal:  2100-2400kcal/day   Protein:  90-104g/day   Fluid:  >2L/day   EDUCATION NEEDS:   Education needs addressed  Koleen Distance MS, RD, LDN Pager #- [phone] After Hours Pager: [phone]

## 2017-08-10 ENCOUNTER — Encounter: Payer: Self-pay | Admitting: *Deleted

## 2017-08-10 ENCOUNTER — Other Ambulatory Visit: Payer: Managed Care, Other (non HMO)

## 2017-08-10 ENCOUNTER — Inpatient Hospital Stay (HOSPITAL_COMMUNITY): Payer: Managed Care, Other (non HMO)

## 2017-08-10 ENCOUNTER — Encounter: Payer: Managed Care, Other (non HMO) | Admitting: Nutrition

## 2017-08-10 ENCOUNTER — Encounter (HOSPITAL_COMMUNITY): Payer: Self-pay | Admitting: Interventional Radiology

## 2017-08-10 ENCOUNTER — Ambulatory Visit: Payer: Managed Care, Other (non HMO)

## 2017-08-10 ENCOUNTER — Ambulatory Visit
Admission: RE | Admit: 2017-08-10 | Discharge: 2017-08-10 | Disposition: A | Discharge status: Home / Self Care | Payer: Managed Care, Other (non HMO) | Source: Ambulatory Visit | Attending: Radiation Oncology | Admitting: Radiation Oncology

## 2017-08-10 HISTORY — PX: IR GASTROSTOMY TUBE MOD SED: IMG625

## 2017-08-10 LAB — CBC WITH DIFFERENTIAL/PLATELET
BASOS PCT: 0 %
Basophils Absolute: 0 10*3/uL (ref 0.0–0.1)
EOS ABS: 0 10*3/uL (ref 0.0–0.7)
Eosinophils Relative: 1 %
HCT: 27.5 % — ABNORMAL LOW (ref 39.0–52.0)
HEMOGLOBIN: 9.5 g/dL — AB (ref 13.0–17.0)
LYMPHS ABS: 0.2 10*3/uL — AB (ref 0.7–4.0)
Lymphocytes Relative: 21 %
MCH: 32.2 pg (ref 26.0–34.0)
MCHC: 34.5 g/dL (ref 30.0–36.0)
MCV: 93.2 fL (ref 78.0–100.0)
Monocytes Absolute: 0.1 10*3/uL (ref 0.1–1.0)
Monocytes Relative: 10 %
NEUTROS ABS: 0.8 10*3/uL — AB (ref 1.7–7.7)
NEUTROS PCT: 68 %
Platelets: 188 10*3/uL (ref 150–400)
RBC: 2.95 MIL/uL — AB (ref 4.22–5.81)
RDW: 13.2 % (ref 11.5–15.5)
WBC: 1.2 10*3/uL — AB (ref 4.0–10.5)

## 2017-08-10 LAB — GLUCOSE, CAPILLARY: GLUCOSE-CAPILLARY: 127 mg/dL — AB (ref 65–99)

## 2017-08-10 LAB — BASIC METABOLIC PANEL
Anion gap: 7 (ref 5–15)
BUN: <5 mg/dL — ABNORMAL LOW (ref 6–20)
CHLORIDE: 106 mmol/L (ref 101–111)
CO2: 28 mmol/L (ref 22–32)
Calcium: 8.3 mg/dL — ABNORMAL LOW (ref 8.9–10.3)
Creatinine, Ser: 0.45 mg/dL — ABNORMAL LOW (ref 0.61–1.24)
GFR calc non Af Amer: >60 mL/min (ref >60)
Glucose, Bld: 90 mg/dL (ref 65–99)
POTASSIUM: 3.3 mmol/L — AB (ref 3.5–5.1)
SODIUM: 141 mmol/L (ref 135–145)

## 2017-08-10 LAB — MAGNESIUM: MAGNESIUM: 1.9 mg/dL (ref 1.7–2.4)

## 2017-08-10 MED ORDER — FENTANYL CITRATE (PF) 100 MCG/2ML IJ SOLN
12.5000 ug | INTRAMUSCULAR | Status: DC | PRN
  Administered 2017-08-10 (×2): 12.5 ug via INTRAVENOUS
  Filled 2017-08-10 (×2): qty 2

## 2017-08-10 MED ORDER — NALOXONE HCL 0.4 MG/ML IJ SOLN
INTRAMUSCULAR | Status: AC
  Filled 2017-08-10: qty 1

## 2017-08-10 MED ORDER — LIDOCAINE HCL 1 % IJ SOLN
INTRAMUSCULAR | Status: AC | PRN
  Administered 2017-08-10: 10 mL

## 2017-08-10 MED ORDER — HYDROMORPHONE HCL-NACL 0.5-0.9 MG/ML-% IV SOSY
1.0000 mg | PREFILLED_SYRINGE | INTRAVENOUS | Status: DC | PRN
  Administered 2017-08-10: 1 mg via INTRAVENOUS
  Filled 2017-08-10 (×2): qty 2

## 2017-08-10 MED ORDER — ONDANSETRON HCL 4 MG/2ML IJ SOLN: 4.0000 mg | Freq: Four times a day (QID) | INTRAMUSCULAR | Status: DC | PRN

## 2017-08-10 MED ORDER — MIDAZOLAM HCL 2 MG/2ML IJ SOLN
INTRAMUSCULAR | Status: AC | PRN
  Administered 2017-08-10 (×2): 1 mg via INTRAVENOUS

## 2017-08-10 MED ORDER — POTASSIUM CHLORIDE 10 MEQ/100ML IV SOLN
10.0000 meq | INTRAVENOUS | Status: AC
  Administered 2017-08-10 (×6): 10 meq via INTRAVENOUS
  Filled 2017-08-10 (×6): qty 100

## 2017-08-10 MED ORDER — FLUMAZENIL 0.5 MG/5ML IV SOLN
INTRAVENOUS | Status: AC
  Filled 2017-08-10: qty 5

## 2017-08-10 MED ORDER — FENTANYL CITRATE (PF) 100 MCG/2ML IJ SOLN
INTRAMUSCULAR | Status: AC
  Filled 2017-08-10: qty 4

## 2017-08-10 MED ORDER — GLUCAGON HCL RDNA (DIAGNOSTIC) 1 MG IJ SOLR
INTRAMUSCULAR | Status: AC
Start: 2017-08-10 — End: 2017-08-10
  Filled 2017-08-10: qty 1

## 2017-08-10 MED ORDER — DIPHENHYDRAMINE HCL 50 MG/ML IJ SOLN: 12.5000 mg | Freq: Four times a day (QID) | INTRAMUSCULAR | Status: DC | PRN

## 2017-08-10 MED ORDER — TRAZODONE HCL 50 MG PO TABS
50.0000 mg | ORAL_TABLET | Freq: Every day | ORAL | Status: DC
  Administered 2017-08-10: 50 mg
  Filled 2017-08-10: qty 1

## 2017-08-10 MED ORDER — FENTANYL CITRATE (PF) 100 MCG/2ML IJ SOLN
INTRAMUSCULAR | Status: AC | PRN
  Administered 2017-08-10 (×2): 50 ug via INTRAVENOUS

## 2017-08-10 MED ORDER — DIPHENHYDRAMINE HCL 12.5 MG/5ML PO ELIX: 12.5000 mg | ORAL_SOLUTION | Freq: Four times a day (QID) | ORAL | Status: DC | PRN

## 2017-08-10 MED ORDER — LIDOCAINE HCL 1 % IJ SOLN
INTRAMUSCULAR | Status: AC
  Filled 2017-08-10: qty 20

## 2017-08-10 MED ORDER — SODIUM CHLORIDE 0.9% FLUSH: 9.0000 mL | INTRAVENOUS | Status: DC | PRN

## 2017-08-10 MED ORDER — GLUCAGON HCL RDNA (DIAGNOSTIC) 1 MG IJ SOLR
INTRAMUSCULAR | Status: AC | PRN
  Administered 2017-08-10: .5 mg via INTRAVENOUS

## 2017-08-10 MED ORDER — HYDROMORPHONE 1 MG/ML IV SOLN
INTRAVENOUS | Status: DC
  Administered 2017-08-10: 23:00:00 via INTRAVENOUS
  Administered 2017-08-10: 2.68 mg via INTRAVENOUS
  Administered 2017-08-11: 3.96 mg via INTRAVENOUS
  Administered 2017-08-11: 3.37 mg via INTRAVENOUS
  Administered 2017-08-11: 2.27 mg via INTRAVENOUS
  Administered 2017-08-11: 1.93 mg via INTRAVENOUS
  Administered 2017-08-11: 4.12 mg via INTRAVENOUS
  Administered 2017-08-12: 1.71 mg via INTRAVENOUS
  Administered 2017-08-12: 1.57 mg via INTRAVENOUS
  Administered 2017-08-12: 2.13 mg via INTRAVENOUS
  Administered 2017-08-12: 1.53 mg via INTRAVENOUS
  Filled 2017-08-10 (×2): qty 25

## 2017-08-10 MED ORDER — OSMOLITE 1.5 CAL PO LIQD
1000.0000 mL | ORAL | Status: DC
  Administered 2017-08-10 – 2017-08-17 (×7): 1000 mL
  Filled 2017-08-10 (×14): qty 1000

## 2017-08-10 MED ORDER — CLONAZEPAM 1 MG PO TABS
1.0000 mg | ORAL_TABLET | Freq: Two times a day (BID) | ORAL | Status: DC
  Administered 2017-08-10 – 2017-08-13 (×6): 1 mg
  Filled 2017-08-10 (×6): qty 1

## 2017-08-10 MED ORDER — MIDAZOLAM HCL 2 MG/2ML IJ SOLN
INTRAMUSCULAR | Status: AC
  Filled 2017-08-10: qty 4

## 2017-08-10 MED ORDER — IOPAMIDOL (ISOVUE-300) INJECTION 61%
INTRAVENOUS | Status: AC
  Administered 2017-08-10: 12 mL
  Filled 2017-08-10: qty 50

## 2017-08-10 MED ORDER — NALOXONE HCL 0.4 MG/ML IJ SOLN: 0.4000 mg | INTRAMUSCULAR | Status: DC | PRN

## 2017-08-10 NOTE — Progress Notes (Signed)
Oncology Nurse Navigator Documentation  Met with Trevor Zavala [facility] 1430 to provide PEG education.  His wife was at bedside. Using  PEG teaching device   and Teach Back, provided education for PEG use and care, including: hand hygiene, gravity bolus administration of daily water flushes and nutritional supplement, fluids and medications; care of tube insertion site including daily dressing change and cleaning; S&S of infection.  Both Trevor Zavala and his wife correctly verbalized dressing change and cleaning procedures, provided correct return demonstration of dressing change and gravity administration of water.  I provided written instructions for PEG flushing/dressing change in support of verbal instruction.    I reviewed contents of Start of Care Bolus Feeding Kit provided by [facility] (6 syringes, box of drain sponges, box 4x4 split gauze, 3 rolls paper tape, case of Osmolite 1.5).  Provided package of mesh briefs, explained adaptation for securing PEG as an alternative to tape.   They understand I will be available for ongoing PEG support.  Rick , RN, BSN, CHPN Head & Neck Oncology Nurse Navigator [facility] [place] [phone]       

## 2017-08-10 NOTE — Progress Notes (Signed)
Phoned [place] with Anderson Malta, RN caring for patient today. She confirms the patient wishes to receive his radiation therapy today and is stable to do so. Explained rad onc transporters will be to the floor to pick the patient up at 1215 to bring him down for radiation. She verbalized understanding. Informed Heather, RT on L3 of this finding.

## 2017-08-10 NOTE — Sedation Documentation (Signed)
Patient denies pain and is resting comfortably.  

## 2017-08-10 NOTE — Progress Notes (Signed)
Nutrition Brief Note  RD consulted for enteral/tube feeding initiation and management. For full assessment see RD note from 08/09/17.   Recommend initiation of  Osmolite 1.5 @ 20 ml/hr  increase by 10 ml/hr every 8 hours to a goal rate of 65 ml/hr  Provides: 2340 kcals, 98 grams protein, 1185 ml free water. This meets 100% of calories needs and 100% of protein needs.   Pt is at high risk for refeeding. Monitor and supplement electrolytes as need per MD descretion.   Mariana Single RD, LDN Clinical Nutrition Pager # [phone]

## 2017-08-10 NOTE — Procedures (Signed)
Lung cancer and head and neck cancer, dysphasia following radiation  Status post fluoroscopic 20 French pull-through gastrostomy  No immediate complication  EBL 0  Full use tomorrow  Full report in PACs

## 2017-08-10 NOTE — Progress Notes (Signed)
Oncology Nurse Navigator Documentation  Oncology Nurse Navigator Flowsheets 08/10/2017  Navigator Location [facility]-  Navigator Encounter Type Other/I updated Dr. Julien Nordmann on Trevor Zavala hospitalization. He requested chemo be cancelled. I updated desk nurse and she cancelled chemo for today.   Patient Visit Type Inpatient  Treatment Phase Other  Barriers/Navigation Needs Coordination of Care  Interventions Coordination of Care  Coordination of Care Other  Acuity Level 2  Time Spent with Patient 30

## 2017-08-10 NOTE — Progress Notes (Signed)
Palliative Medicine consult noted. Due to high referral volume, there may be a delay seeing this patient. Please call the Palliative Medicine Team office at [phone] if recommendations are needed in the interim.  Thank you for inviting [place] to see this patient.  Marjie Skiff Kitt Ledet, RN, BSN, [facility] 08/10/2017 3:30 PM Cell [phone] 8:00-4:00 Monday-Friday Office [phone]

## 2017-08-10 NOTE — Progress Notes (Signed)
PROGRESS NOTE    Trevor Zavala  DGL:[id number] DOB: [birth year] DOA: 08/07/2017 PCP: Cassandria Anger, MD     Brief Narrative:  Trevor Zavala is a 51 y.o. male with medical history significant of stage-III squamous cell lung cancer, squamous cell cancer of the epiglottis, currently on chemotherapy and radiation therapy, hypertension, COPD, GERD, depression, anxiety, tobacco abuse, who presents with dysphagia, generalized weakness and SOB. Patient states that he has dysphagia because of epiglottis cancer and radiation. He can only eat little liquid food, and has significantly decreased appetite and oral intake recently. Patient was admitted for further pain management as well as PEG placement for poor nutrition.   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 Continues to require increase in pain medication regimen. He is currently on fentanyl patch, dilaudid, MS Contin, hycet. Every day, he has been requesting more and more pain medication. Will consult palliative care for symptom management.    Stage III squamous cell carcinoma of left lung -Follows with Dr. Julien Nordmann  COPD -Stable, not in acute exacerbation -Denies SOB this morning   GERD -IV  pepcid  Generalized anxiety disorder -Continue home klonopin  HTN -Hydralazine prn IV   Hypokalemia -Replaced, trend   Leukopenia -Monitor   Tobacco abuse -Nicotine patch, cessation counseling   Constipation -Bowel regimen     DVT prophylaxis: SCD Code Status: Full Family Communication: No family at bedside Disposition Plan: Pending improvement and toleration of tube feeding    Consultants:   IR  Palliative care   Procedures:   None  Antimicrobials:  Anti-infectives    Start     Dose/Rate Route Frequency Ordered Stop   08/10/17 0600  ceFAZolin (ANCEF) IVPB 2g/100 mL premix     2 g 200 mL/hr over 30 Minutes Intravenous To Radiology 08/09/17 0826 08/10/17 0745       Subjective: Pain continues to be an issue.   Objective: Vitals:   08/10/17 0900 08/10/17 0905 08/10/17 0910 08/10/17 0919  BP: 129/77 125/71 125/81   Pulse: 81 94 88   Resp: 17 17 17    Temp:      TempSrc:      SpO2: (!) 88% 90% (!) 88% 91%  Weight:      Height:        Intake/Output Summary (Last 24 hours) at 08/10/17 1244 Last data filed at 08/10/17 0200  Gross per 24 hour  Intake          2931.66 ml  Output                0 ml  Net          2931.66 ml   Filed Weights   08/07/17 1801 08/08/17 0657  Weight: 68 kg (150 lb) 69.7 kg (153 lb 9.6 oz)  Examination:  General exam: Appears calm and comfortable  HEENT: skin changes on neck and chest due to radiation, voice is hoarse  Respiratory system: Clear to auscultation. Respiratory effort normal. Cardiovascular system: S1 & S2 heard, RRR. No JVD, murmurs, rubs, gallops or clicks. No pedal edema. Gastrointestinal system: Abdomen is nondistended, soft and nontender. No organomegaly or masses felt. Normal bowel sounds heard. Central nervous system: Alert and oriented. No focal neurological deficits. Extremities: Symmetric 5 x 5 power. Skin: No rashes, lesions or ulcers Psychiatry: Judgement and insight appear normal. Mood &  affect appropriate.   Data Reviewed: I have personally reviewed following labs and imaging studies  CBC:  Recent Labs Lab 08/07/17 1833 08/08/17 0436 08/09/17 1030 08/10/17 0314  WBC 2.4* 1.5* 1.3* 1.2*  NEUTROABS 2.0  --  1.0* 0.8*  HGB 11.3* 9.5* 8.8* 9.5*  HCT 33.1* 27.3* 25.1* 27.5*  MCV 93.8 92.2 91.6 93.2  PLT 184 181 174 154   Basic Metabolic Panel:  Recent Labs Lab 08/07/17 1833 08/08/17 0436 08/09/17 1030 08/10/17 0314  NA 140 142 137 141  K 3.2* 3.5 2.9* 3.3*  CL 104 109 104 106  CO2 26 27 27 28   GLUCOSE 87 80 89 90  BUN 13 9 <5* <5*  CREATININE 0.49* 0.44* 0.39* 0.45*  CALCIUM 9.0 8.5* 8.0* 8.3*  MG  --  1.9 1.6* 1.9   GFR: Estimated Creatinine Clearance: 106.5 mL/min (A) (by C-G formula based on SCr of 0.45 mg/dL (L)). Liver Function Tests:  Recent Labs Lab 08/07/17 1833  AST 20  ALT 43  ALKPHOS 58  BILITOT 1.0  PROT 6.1*  ALBUMIN 3.1*   No results for input(s): LIPASE, AMYLASE in the last 168 hours. No results for input(s): AMMONIA in the last 168 hours. Coagulation Profile:  Recent Labs Lab 08/07/17 2327  INR 1.13   Cardiac Enzymes: No results for input(s): CKTOTAL, CKMB, CKMBINDEX, TROPONINI in the last 168 hours. BNP (last 3 results) No results for input(s): PROBNP in the last 8760 hours. HbA1C: No results for input(s): HGBA1C in the last 72 hours. CBG:  Recent Labs Lab 08/08/17 0911 08/09/17 0742  GLUCAP 82 86   Lipid Profile: No results for input(s): CHOL, HDL, LDLCALC, TRIG, CHOLHDL, LDLDIRECT in the last 72 hours. Thyroid Function Tests: No results for input(s): TSH, T4TOTAL, FREET4, T3FREE, THYROIDAB in the last 72 hours. Anemia Panel: No results for input(s): VITAMINB12, FOLATE, FERRITIN, TIBC, IRON, RETICCTPCT in the last 72 hours. Sepsis Labs:  Recent Labs Lab 08/07/17 1845  LATICACIDVEN 0.49*    No results found for this or any previous visit (from the past 240 hour(s)).     Radiology Studies: Ir  Gastrostomy Tube Mod Sed  Result Date: 08/10/2017 INDICATION: Head and neck cancer, receiving chemotherapy and radiation, dysphagia EXAM: TWENTY FRENCH PULL-THROUGH GASTROSTOMY Date:  10/1/201810/11/2016 9:21 am Radiologist:  M. Daryll Brod, MD Guidance:  Fluoroscopic MEDICATIONS: Ancef 2 g; Antibiotics were administered within 1 hour of the procedure. Glucagon 0.5 mg IV ANESTHESIA/SEDATION: Versed 2.0 mg IV; Fentanyl 1 high mcg IV Moderate Sedation Time:  18 minutes The patient was continuously monitored during the procedure by the interventional radiology nurse under my direct supervision. CONTRAST:  10 cc - administered into the gastric lumen. FLUOROSCOPY TIME:  Fluoroscopy Time: 1 minutes 42 seconds (32 mGy). COMPLICATIONS: None immediate. PROCEDURE: Informed consent was obtained from the patient following explanation of the procedure, risks, benefits and alternatives. The patient understands, agrees and consents for the procedure. All questions were  addressed. A time out was performed. Maximal barrier sterile technique utilized including caps, mask, sterile gowns, sterile gloves, large sterile drape, hand hygiene, and betadine prep. The left upper quadrant was sterilely prepped and draped. An oral gastric catheter was inserted into the stomach under fluoroscopy. The existing nasogastric feeding tube was removed. Air was injected into the stomach for insufflation and visualization under fluoroscopy. The air distended stomach was confirmed beneath the anterior abdominal wall in the frontal and lateral projections. Under sterile conditions and local anesthesia, a 25 gauge trocar needle was utilized to access the stomach percutaneously beneath the left subcostal margin. Needle position was confirmed within the stomach under biplane fluoroscopy. Contrast injection confirmed position also. A single T tack was deployed for gastropexy. Over an Amplatz guide wire, a 9-French sheath was inserted into the stomach. A  snare device was utilized to capture the oral gastric catheter. The snare device was pulled retrograde from the stomach up the esophagus and out the oropharynx. The 20-French pull-through gastrostomy was connected to the snare device and pulled antegrade through the oropharynx down the esophagus into the stomach and then through the percutaneous tract external to the patient. The gastrostomy was assembled externally. Contrast injection confirms position in the stomach. Images were obtained for documentation. The patient tolerated procedure well. No immediate complication. IMPRESSION: Fluoroscopic insertion of a 20-French "pull-through" gastrostomy. Electronically Signed   By: Jerilynn Mages.  Shick M.D.   On: 08/10/2017 09:41      Scheduled Meds: . feeding supplement  1 Container Oral TID BM  . fentaNYL  25 mcg Transdermal Q72H  . fentaNYL      . fluticasone furoate-vilanterol  1 puff Inhalation Daily  . glucagon (human recombinant)      . heparin lock flush  500 Units Intracatheter Once  . midazolam      . morphine  15 mg Oral BID  . multivitamin  15 mL Oral Daily  . nicotine  21 mg Transdermal Daily  . polyethylene glycol  17 g Oral Daily  . sennosides  5 mL Oral QHS   Continuous Infusions: . sodium chloride 100 mL/hr at 08/10/17 0629  . famotidine (PEPCID) IV    . potassium chloride 10 mEq (08/10/17 1119)     LOS: 3 days    Time spent: 30 minutes   Dessa Phi, DO [facility] [email or web address] Password [email or web address] 08/10/2017, 12:44 PM

## 2017-08-10 NOTE — Consult Note (Signed)
Patient seen and evaluated for pain control. Current regimen is inadequate and has clinical duplication-route of administration has been an issue.  Reviewed chart and medication administration documentation.   51 yo cancer related pain, anticipatory anxiety and generalized discomfort.  For clarity I recommend starting a hydromorphone PCA for 24 hours to determine total 24 hour dosing needs-stop all other opioids and routes. Once we see a stable dosing pattern we can transition to a per PEG regimen. Added on scheduled instead of PRN BID klonopin and Trazodone for sleep.  Will follow for symptom management issues.  [name], DO Palliative Medicine [phone]  Time: 35 minutes Greater than 50%  of this time was spent counseling and coordinating care related to the above assessment and plan.

## 2017-08-11 ENCOUNTER — Ambulatory Visit
Admission: RE | Admit: 2017-08-11 | Discharge: 2017-08-11 | Disposition: A | Discharge status: Home / Self Care | Payer: Managed Care, Other (non HMO) | Source: Ambulatory Visit | Attending: Radiation Oncology | Admitting: Radiation Oncology

## 2017-08-11 LAB — BASIC METABOLIC PANEL
Anion gap: 6 (ref 5–15)
BUN: <5 mg/dL — ABNORMAL LOW (ref 6–20)
CHLORIDE: 103 mmol/L (ref 101–111)
CO2: 29 mmol/L (ref 22–32)
CREATININE: 0.45 mg/dL — AB (ref 0.61–1.24)
Calcium: 8.6 mg/dL — ABNORMAL LOW (ref 8.9–10.3)
GFR calc non Af Amer: >60 mL/min (ref >60)
GLUCOSE: 115 mg/dL — AB (ref 65–99)
Potassium: 3.8 mmol/L (ref 3.5–5.1)
SODIUM: 138 mmol/L (ref 135–145)

## 2017-08-11 LAB — MAGNESIUM: Magnesium: 1.8 mg/dL (ref 1.7–2.4)

## 2017-08-11 LAB — CBC WITH DIFFERENTIAL/PLATELET
BASOS PCT: 0 %
Basophils Absolute: 0 10*3/uL (ref 0.0–0.1)
EOS ABS: 0 10*3/uL (ref 0.0–0.7)
Eosinophils Relative: 1 %
HEMATOCRIT: 26.7 % — AB (ref 39.0–52.0)
Hemoglobin: 9.1 g/dL — ABNORMAL LOW (ref 13.0–17.0)
Lymphocytes Relative: 19 %
Lymphs Abs: 0.3 10*3/uL — ABNORMAL LOW (ref 0.7–4.0)
MCH: 31.5 pg (ref 26.0–34.0)
MCHC: 34.1 g/dL (ref 30.0–36.0)
MCV: 92.4 fL (ref 78.0–100.0)
MONO ABS: 0.2 10*3/uL (ref 0.1–1.0)
MONOS PCT: 13 %
Neutro Abs: 1.1 10*3/uL — ABNORMAL LOW (ref 1.7–7.7)
Neutrophils Relative %: 67 %
Platelets: 176 10*3/uL (ref 150–400)
RBC: 2.89 MIL/uL — ABNORMAL LOW (ref 4.22–5.81)
RDW: 13.5 % (ref 11.5–15.5)
WBC: 1.6 10*3/uL — ABNORMAL LOW (ref 4.0–10.5)

## 2017-08-11 LAB — GLUCOSE, CAPILLARY: Glucose-Capillary: 161 mg/dL — ABNORMAL HIGH (ref 65–99)

## 2017-08-11 LAB — PHOSPHORUS: PHOSPHORUS: 3.1 mg/dL (ref 2.5–4.6)

## 2017-08-11 MED ORDER — FAMOTIDINE 20 MG PO TABS: 20.0000 mg | ORAL_TABLET | Freq: Two times a day (BID) | ORAL | Status: DC | PRN

## 2017-08-11 MED ORDER — TRAZODONE HCL 100 MG PO TABS
100.0000 mg | ORAL_TABLET | Freq: Every day | ORAL | Status: DC
  Administered 2017-08-11: 100 mg
  Filled 2017-08-11: qty 1

## 2017-08-11 NOTE — Progress Notes (Signed)
Referring Physician(s): Choi,J  Supervising Physician: Aletta Edouard  Patient Status:  [facility] - In-pt  Chief Complaint:  Dysphagia/esophagitis  Subjective: Pt doing ok today ; eager to go home; does have some soreness at G-tube insertion site as expected. Denies nausea or vomiting   Allergies: Patient has no known allergies.  Medications: Prior to Admission medications   Medication Sig Start Date End Date Taking? Authorizing Provider  BREO ELLIPTA 100-25 MCG/INH AEPB INHALE 1 PUFF INTO THE LUNGS DAILY 01/26/17  Yes [name], [name], MD  Cholecalciferol (VITAMIN D PO) Take 1 tablet by mouth daily.   Yes [provider]  clonazePAM (KLONOPIN) 0.5 MG tablet Take 1 tablet (0.5 mg total) by mouth 2 (two) times daily as needed for anxiety. 02/19/17  Yes [name], [name], MD  emollient (BIAFINE) cream Apply topically as needed.   Yes [provider]  esomeprazole (NEXIUM) 40 MG capsule TAKE 1 CAPSULE BY MOUTH DAILY WITH BREAKFAST 01/16/17  Yes [name], [name], MD  fentaNYL (DURAGESIC - DOSED MCG/HR) 25 MCG/HR patch Place 1 patch (25 mcg total) onto the skin every 3 (three) days. 08/06/17  Yes Tyler Pita, MD  lidocaine-prilocaine (EMLA) cream Apply 1 application topically as needed. 07/02/17  Yes Grace Isaac, MD  loratadine (CLARITIN) 10 MG tablet Take 10 mg by mouth daily as needed for allergies.   Yes [provider]  Morphine Sulfate ER 15 MG TBEA Take 15 mg by mouth 2 (two) times daily. 08/03/17  Yes Hayden Pedro, PA-C  nicotine (NICODERM CQ) 21 mg/24hr patch Place 1 patch (21 mg total) onto the skin daily. 06/06/17  Yes Deno Etienne, DO  oxyCODONE (OXY IR/ROXICODONE) 5 MG immediate release tablet Take 1-2 tablets (5-10 mg total) by mouth every 4 (four) hours as needed for severe pain. smallest size pill possible 07/27/17  Yes Hayden Pedro, PA-C  prochlorperazine (COMPAZINE) 10 MG tablet Take 1 tablet (10 mg total) by  mouth every 6 (six) hours as needed for nausea or vomiting. 06/19/17  Yes Curt Bears, MD  acyclovir (ZOVIRAX) 400 MG tablet TAKE 1 TABLET (400 MG TOTAL) BY MOUTH 3 (THREE) TIMES DAILY. Patient not taking: Reported on 07/06/2017 01/21/16   [name], [name], MD  aspirin (ASPIRIN CHILDRENS) 81 MG chewable tablet Chew 1 tablet (81 mg total) by mouth daily. Patient not taking: Reported on 07/20/2017 02/03/14   [name], [name], MD  lidocaine (XYLOCAINE) 2 % solution 5 ml po q 8 hours as needed for mouth and throat pain Patient not taking: Reported on 08/03/2017 07/20/17   Curt Bears, MD  mucosal barrier oral (GELCLAIR) GEL Take 1 packet by mouth 3 (three) times daily as needed. Patient not taking: Reported on 08/07/2017 07/27/17   Hayden Pedro, PA-C  sucralfate (CARAFATE) 1 g tablet Take 1 tablet (1 g total) by mouth 4 (four) times daily -  with meals and at bedtime. Crush and Mix in 1 oz water to make slurry Patient not taking: Reported on 08/07/2017 07/14/17   Hayden Pedro, PA-C  valsartan (DIOVAN) 160 MG tablet Take 1 tablet (160 mg total) by mouth daily. Patient not taking: Reported on 07/20/2017 01/16/17 01/16/18  [name], [name], MD  vitamin e (AQUEOUS VITAMIN E) 15 UNIT/0.3ML SOLN solution Take 1 mL (50 Units total) by mouth 3 (three) times daily as needed. Patient not taking: Reported on 08/07/2017 08/04/17   Hayden Pedro, PA-C     Vital Signs: BP (!) 102/53 (BP Location:  Right Arm)   Pulse 96   Temp 98.8 F (37.1 C) (Oral)   Resp 16   Ht 5\' 11"  (1.803 m)   Wt 153 lb 9.6 oz (69.7 kg)   SpO2 96%   BMI 21.42 kg/m   Physical Exam awake, alert. G-tube intact, insertion site okay, site mildly tender. Abdomen soft, positive bowel sounds  Imaging: Dg Chest 2 View  Result Date: 08/07/2017 CLINICAL DATA:  Productive cough. Shortness of breath with exertion. Currently undergoing chemotherapy and radiation therapy for throat and lung cancer. Ex-smoker.  EXAM: CHEST  2 VIEW COMPARISON:  07/01/2017. FINDINGS: Normal with sized heart. Clear lungs. Right jugular porta catheter tip at the junction of the superior vena cava and right atrium. Thoracic spine degenerative changes. IMPRESSION: No acute abnormality. Electronically Signed   By: Claudie Revering M.D.   On: 08/07/2017 18:30   Ct Soft Tissue Neck W Contrast  Result Date: 08/07/2017 CLINICAL DATA:  Shortness of breath and neck swelling after radiation. History of lung cancer. EXAM: CT NECK WITH CONTRAST TECHNIQUE: Multidetector CT imaging of the neck was performed using the standard protocol following the bolus administration of intravenous contrast. CONTRAST:  100 cc Isovue 370 COMPARISON:  PET- CT June 10, 2017 FINDINGS: PHARYNX AND LARYNX: Mild late data/infiltrated paraglottic fat with thickened edematous epiglottis. Patent airway. Normal larynx. SALIVARY GLANDS: Normal. THYROID: Normal. LYMPH NODES: 13 mm LEFT supraclavicular lymph node was 16 mm. VASCULAR: Normal. LIMITED INTRACRANIAL: Normal. VISUALIZED ORBITS: Normal. MASTOIDS AND VISUALIZED PARANASAL SINUSES: Small LEFT maxillary sinus mucosal retention cyst. SKELETON: Nonacute.  RIGHT chest Port-A-Cath. UPPER CHEST: Please see CT chest from same day, reported separately for dedicated findings. OTHER: None. IMPRESSION: 1. Mild post radiation supraglottic edema, patent airway. 2. Smaller pathologic LEFT supraclavicular lymph node. Electronically Signed   By: Elon Alas M.D.   On: 08/07/2017 21:52   Ct Angio Chest Pe W And/or Wo Contrast  Result Date: 08/07/2017 CLINICAL DATA:  PE suspected, high pretest prob. Shortness of breath and neck swelling. Postradiation, stage III squamous cell carcinoma of left lung. EXAM: CT ANGIOGRAPHY CHEST WITH CONTRAST TECHNIQUE: Multidetector CT imaging of the chest was performed using the standard protocol during bolus administration of intravenous contrast. Multiplanar CT image reconstructions and MIPs were  obtained to evaluate the vascular anatomy. CONTRAST:  100 cc Isovue 370 IV COMPARISON:  Chest radiograph earlier this day. PET-CT 06/10/2017. Chest CT PE protocol 06/06/2017 FINDINGS: Cardiovascular: Stable encasement of the left pulmonary artery at the left hilum without filling defects to suggest pulmonary embolus. Small caliber left upper lobe pulmonary vessels secondary to encasement. No filling defects throughout the right pulmonary arteries to suggest pulmonary embolus. The heart is normal in size. Normal caliber thoracic aorta with mild atherosclerosis. Right chest port in place. No evidence of IVC thrombus. No pericardial effusion. Mediastinum/Nodes: Left hilar mass (measurement includes the left pulmonary artery) measures 5.4 x 5.3 cm, slight decrease from prior PET using same measurement technique. This encases the left pulmonary artery and left upper lobe bronchus. Left supraclavicular node measures 12 mm previously 16 mm. Decreased size of left paratracheal node from prior PET. No new mediastinal or hilar adenopathy. The hypermetabolic right infrahilar node on prior PET is normal in size. Lungs/Pleura: Mild emphysema. The previous 12 mm spiculated right upper lobe nodule has resolved with faint scarring and cystic focus image 32 series 12. Improving postobstructive pneumonitis in the lingula and left upper lobe. 5 mm faint left upper lobe nodule image 23 series 12, and  decreased from prior exam. No new pulmonary nodule or focal airspace disease. There is bubbly debris within the left mainstem bronchi and right bronchus intermedius. No pleural fluid. Upper Abdomen: No acute finding.  No adrenal nodule. Musculoskeletal: There are no acute or suspicious osseous abnormalities. No focal bone lesion. Review of the MIP images confirms the above findings. IMPRESSION: 1. No pulmonary embolus. 2. Left hilar mass has decreased in size, persistent encasement of the left pulmonary artery. Decreased postobstructive  pneumonitis in the left upper lobe. 3. Previous spiculated right upper lobe 12 mm nodule has resolved with minimal residual scarring/ cystic focus. 4. Left supraclavicular node has decreased in size. 5. Bubbly debris within the left mainstem bronchus and right bronchus intermedius suggesting mucus. Aortic Atherosclerosis (ICD10-I70.0) and Emphysema (ICD10-J43.9). Electronically Signed   By: Jeb Levering M.D.   On: 08/07/2017 21:58   Ir Gastrostomy Tube Mod Sed  Result Date: 08/10/2017 INDICATION: Head and neck cancer, receiving chemotherapy and radiation, dysphagia EXAM: TWENTY FRENCH PULL-THROUGH GASTROSTOMY Date:  10/1/201810/11/2016 9:21 am Radiologist:  M. Daryll Brod, MD Guidance:  Fluoroscopic MEDICATIONS: Ancef 2 g; Antibiotics were administered within 1 hour of the procedure. Glucagon 0.5 mg IV ANESTHESIA/SEDATION: Versed 2.0 mg IV; Fentanyl 1 high mcg IV Moderate Sedation Time:  18 minutes The patient was continuously monitored during the procedure by the interventional radiology nurse under my direct supervision. CONTRAST:  10 cc - administered into the gastric lumen. FLUOROSCOPY TIME:  Fluoroscopy Time: 1 minutes 42 seconds (32 mGy). COMPLICATIONS: None immediate. PROCEDURE: Informed consent was obtained from the patient following explanation of the procedure, risks, benefits and alternatives. The patient understands, agrees and consents for the procedure. All questions were addressed. A time out was performed. Maximal barrier sterile technique utilized including caps, mask, sterile gowns, sterile gloves, large sterile drape, hand hygiene, and betadine prep. The left upper quadrant was sterilely prepped and draped. An oral gastric catheter was inserted into the stomach under fluoroscopy. The existing nasogastric feeding tube was removed. Air was injected into the stomach for insufflation and visualization under fluoroscopy. The air distended stomach was confirmed beneath the anterior abdominal  wall in the frontal and lateral projections. Under sterile conditions and local anesthesia, a 40 gauge trocar needle was utilized to access the stomach percutaneously beneath the left subcostal margin. Needle position was confirmed within the stomach under biplane fluoroscopy. Contrast injection confirmed position also. A single T tack was deployed for gastropexy. Over an Amplatz guide wire, a 9-French sheath was inserted into the stomach. A snare device was utilized to capture the oral gastric catheter. The snare device was pulled retrograde from the stomach up the esophagus and out the oropharynx. The 20-French pull-through gastrostomy was connected to the snare device and pulled antegrade through the oropharynx down the esophagus into the stomach and then through the percutaneous tract external to the patient. The gastrostomy was assembled externally. Contrast injection confirms position in the stomach. Images were obtained for documentation. The patient tolerated procedure well. No immediate complication. IMPRESSION: Fluoroscopic insertion of a 20-French "pull-through" gastrostomy. Electronically Signed   By: Jerilynn Mages.  Shick M.D.   On: 08/10/2017 09:41    Labs:  CBC:  Recent Labs  08/08/17 0436 08/09/17 1030 08/10/17 0314 08/11/17 0418  WBC 1.5* 1.3* 1.2* 1.6*  HGB 9.5* 8.8* 9.5* 9.1*  HCT 27.3* 25.1* 27.5* 26.7*  PLT 181 174 188 176    COAGS:  Recent Labs  06/01/17 0848 06/12/17 0702 07/01/17 1147 08/07/17 2327  INR 1.0 0.96  1.03 1.13  APTT 29.9 32 27 31    BMP:  Recent Labs  08/08/17 0436 08/09/17 1030 08/10/17 0314 08/11/17 0418  NA 142 137 141 138  K 3.5 2.9* 3.3* 3.8  CL 109 104 106 103  CO2 27 27 28 29   GLUCOSE 80 89 90 115*  BUN 9 <5* <5* <5*  CALCIUM 8.5* 8.0* 8.3* 8.6*  CREATININE 0.44* 0.39* 0.45* 0.45*  GFRNONAA >60 >60 >60 >60  GFRAA >60 >60 >60 >60    LIVER FUNCTION TESTS:  Recent Labs  07/20/17 0916 07/27/17 0843 08/03/17 0757 08/07/17 1833    BILITOT 0.32 0.32 0.54 1.0  AST 13 12 14 20   ALT 24 21 23  43  ALKPHOS 82 83 69 58  PROT 6.6 6.1* 6.6 6.1*  ALBUMIN 2.9* 2.8* 3.0* 3.1*    Assessment and Plan: Patient with history of squamous cell carcinoma of left lung and epiglottis; now with dysphagia and radiation esophagitis; status post perc G-tube yesterday;afebrile;hemoglobin stable at 9.1;WBC 1.6; okay to use tube for feeds; additional plans as per primary service/oncology   Electronically Signed: D. Rowe Robert, PA-C 08/11/2017, 10:27 AM   I spent a total of 15 minutes at the the patient's bedside AND on the patient's hospital floor or unit, greater than 50% of which was counseling/coordinating care for percutaneous gastrostomy tube    Patient ID: Trevor Zavala, male   DOB: [birth year], 51 y.o.   MRN: [id number]

## 2017-08-11 NOTE — Plan of Care (Signed)
Problem: Nutrition: Goal: Adequate nutrition will be maintained Outcome: Not Progressing Pt cough when taking oral liquids, suction set up at the bedside for pt to cough up phelgm. SRP, RN

## 2017-08-11 NOTE — Progress Notes (Signed)
PROGRESS NOTE    [name]  RCV:[id number] DOB: [birth year] DOA: 08/07/2017 PCP: Cassandria Anger, MD     Brief Narrative:  Trevor Zavala is a 51 y.o. male with medical history significant of stage-III squamous cell lung cancer, squamous cell cancer of the epiglottis, currently on chemotherapy and radiation therapy, hypertension, COPD, GERD, depression, anxiety, tobacco abuse, who presents with dysphagia, generalized weakness and SOB. Patient states that he has dysphagia because of epiglottis cancer and radiation. He can only eat little liquid food, and has significantly decreased appetite and oral intake recently. Patient was admitted for further pain management as well as PEG placement for poor nutrition. PEG was placed 10/1. Due to ongoing pain, palliative care was consulted for symptom management.  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 Monitor electrolytes, at risk of refeeding syndrome -Appreciate palliative care for symptom management, currently on Dilaudid PCA with symptomatic improvement.  Stage III squamous cell carcinoma of left lung -Follows with Dr. Julien Nordmann  COPD -Stable, not in acute exacerbation -Denies SOB this morning   GERD -IV pepcid  Generalized anxiety disorder -Continue home  klonopin  HTN -Hydralazine prn IV   Leukopenia -Monitor   Tobacco abuse -Nicotine patch, cessation counseling   Constipation -Bowel regimen     DVT prophylaxis: SCD Code Status: Full Family Communication: No family at bedside, updated wife over the phone. 10/2 Disposition Plan: Pending improvement and toleration of tube feeding, improvement of pain, electrolyte stabilization   Consultants:   IR  Palliative care   Procedures:   None  Antimicrobials:  Anti-infectives    Start     Dose/Rate Route Frequency Ordered Stop   08/10/17 0600  ceFAZolin (ANCEF) IVPB 2g/100 mL premix     2 g 200 mL/hr over 30 Minutes Intravenous To Radiology 08/09/17 0826 08/10/17 0745       Subjective: Pain is much better on Dilaudid PCA this morning. Tolerating tube feedings, no nausea, vomiting or abdominal pain. Denies any chest pain or shortness of breath.  Objective: Vitals:   08/11/17 0538 08/11/17 0652 08/11/17 0815 08/11/17 0848  BP:  117/68  (!) 102/53  Pulse:  (!) 108  96  Resp: (!) 23 19 17 16   Temp:    98.8 F (37.1 C)  TempSrc:  Oral  Oral  SpO2: 94% 91% 97% 96%  Weight:      Height:        Intake/Output Summary (Last 24 hours) at 08/11/17 1149 Last data filed at 08/11/17 0425  Gross per 24 hour  Intake           547.33 ml  Output                0 ml  Net           547.33 ml   Filed Weights   08/07/17 1801 08/08/17 1025  Weight: 68 kg (150 lb) 69.7 kg (153 lb 9.6 oz)    Examination:  General exam: Appears calm and comfortable  HEENT: skin changes on neck and chest due to radiation, voice is hoarse  Respiratory system: Clear to auscultation. Respiratory effort normal. Cardiovascular system: S1 & S2 heard, RRR. No JVD, murmurs, rubs, gallops or clicks. No pedal edema. Gastrointestinal system: Abdomen is nondistended, soft and nontender. No organomegaly or masses felt. Normal bowel sounds heard. +PEG in place  Central nervous system: Alert and oriented. No  focal neurological deficits. Extremities: Symmetric 5 x 5 power. Skin: No rashes, lesions or ulcers Psychiatry: Judgement and insight appear normal. Mood & affect appropriate.   Data Reviewed: I have personally reviewed following labs and imaging studies  CBC:  Recent Labs Lab 08/07/17 1833 08/08/17 0436 08/09/17 1030 08/10/17 0314 08/11/17 0418  WBC 2.4* 1.5* 1.3* 1.2* 1.6*  NEUTROABS 2.0  --  1.0* 0.8* 1.1*  HGB 11.3* 9.5* 8.8* 9.5* 9.1*  HCT 33.1* 27.3* 25.1* 27.5* 26.7*  MCV 93.8 92.2 91.6 93.2 92.4  PLT 184 181 174 188 194   Basic Metabolic Panel:  Recent Labs Lab 08/07/17 1833 08/08/17 0436 08/09/17 1030 08/10/17 0314 08/11/17 0418  NA 140 142 137 141 138  K 3.2* 3.5 2.9* 3.3* 3.8  CL 104 109 104 106 103  CO2 26 27 27 28 29   GLUCOSE 87 80 89 90 115*  BUN 13 9 <5* <5* <5*  CREATININE 0.49* 0.44* 0.39* 0.45* 0.45*  CALCIUM 9.0 8.5* 8.0* 8.3* 8.6*  MG  --  1.9 1.6* 1.9 1.8  PHOS  --   --   --   --  3.1   GFR: Estimated Creatinine Clearance: 106.5 mL/min (A) (by C-G formula based on SCr of 0.45 mg/dL (L)). Liver Function Tests:  Recent Labs Lab 08/07/17 1833  AST 20  ALT 43  ALKPHOS 58  BILITOT 1.0  PROT 6.1*  ALBUMIN 3.1*   No results for input(s): LIPASE, AMYLASE in the last 168 hours. No results for input(s): AMMONIA in the last 168 hours. Coagulation Profile:  Recent Labs Lab 08/07/17 2327  INR 1.13   Cardiac Enzymes: No results for input(s): CKTOTAL, CKMB, CKMBINDEX, TROPONINI in the last 168 hours. BNP (last 3 results) No results for input(s): PROBNP in the last 8760 hours. HbA1C: No results for input(s): HGBA1C in the last 72 hours. CBG:  Recent Labs Lab 08/08/17 0911 08/09/17 0742 08/10/17 0753 08/11/17 0826  GLUCAP 82 86 127* 161*   Lipid Profile: No results for input(s): CHOL, HDL, LDLCALC, TRIG, CHOLHDL, LDLDIRECT in the last 72 hours. Thyroid Function Tests: No results for input(s): TSH, T4TOTAL, FREET4, T3FREE,  THYROIDAB in the last 72 hours. Anemia Panel: No results for input(s): VITAMINB12, FOLATE, FERRITIN, TIBC, IRON, RETICCTPCT in the last 72 hours. Sepsis Labs:  Recent Labs Lab 08/07/17 1845  LATICACIDVEN 0.49*    No results found for this or any previous visit (from the past 240 hour(s)).     Radiology Studies: Ir Gastrostomy Tube Mod Sed  Result Date: 08/10/2017 INDICATION: Head and neck cancer, receiving chemotherapy and radiation, dysphagia EXAM: TWENTY FRENCH PULL-THROUGH GASTROSTOMY Date:  10/1/201810/11/2016 9:21 am Radiologist:  M. Daryll Brod, MD Guidance:  Fluoroscopic MEDICATIONS: Ancef 2 g; Antibiotics were administered within 1 hour of the procedure. Glucagon 0.5 mg IV ANESTHESIA/SEDATION: Versed 2.0 mg IV; Fentanyl 1 high mcg IV Moderate Sedation Time:  18 minutes The patient was continuously monitored during the procedure by the interventional radiology  nurse under my direct supervision. CONTRAST:  10 cc - administered into the gastric lumen. FLUOROSCOPY TIME:  Fluoroscopy Time: 1 minutes 42 seconds (32 mGy). COMPLICATIONS: None immediate. PROCEDURE: Informed consent was obtained from the patient following explanation of the procedure, risks, benefits and alternatives. The patient understands, agrees and consents for the procedure. All questions were addressed. A time out was performed. Maximal barrier sterile technique utilized including caps, mask, sterile gowns, sterile gloves, large sterile drape, hand hygiene, and betadine prep. The left upper quadrant was sterilely prepped and draped. An oral gastric catheter was inserted into the stomach under fluoroscopy. The existing nasogastric feeding tube was removed. Air was injected into the stomach for insufflation and visualization under fluoroscopy. The air distended stomach was confirmed beneath the anterior abdominal wall in the frontal and lateral projections. Under sterile conditions and local anesthesia, a 8 gauge trocar needle  was utilized to access the stomach percutaneously beneath the left subcostal margin. Needle position was confirmed within the stomach under biplane fluoroscopy. Contrast injection confirmed position also. A single T tack was deployed for gastropexy. Over an Amplatz guide wire, a 9-French sheath was inserted into the stomach. A snare device was utilized to capture the oral gastric catheter. The snare device was pulled retrograde from the stomach up the esophagus and out the oropharynx. The 20-French pull-through gastrostomy was connected to the snare device and pulled antegrade through the oropharynx down the esophagus into the stomach and then through the percutaneous tract external to the patient. The gastrostomy was assembled externally. Contrast injection confirms position in the stomach. Images were obtained for documentation. The patient tolerated procedure well. No immediate complication. IMPRESSION: Fluoroscopic insertion of a 20-French "pull-through" gastrostomy. Electronically Signed   By: Jerilynn Mages.  Shick M.D.   On: 08/10/2017 09:41      Scheduled Meds: . clonazePAM  1 mg Per Tube BID  . feeding supplement  1 Container Oral TID BM  . fluticasone furoate-vilanterol  1 puff Inhalation Daily  . HYDROmorphone   Intravenous Q4H  . multivitamin  15 mL Oral Daily  . nicotine  21 mg Transdermal Daily  . polyethylene glycol  17 g Oral Daily  . sennosides  5 mL Oral QHS  . traZODone  50 mg Per Tube QHS   Continuous Infusions: . feeding supplement (OSMOLITE 1.5 CAL) 1,000 mL (08/10/17 1557)     LOS: 4 days    Time spent: 30 minutes   Trevor Phi, DO [facility] [email or web address] Password [email or web address] 08/11/2017, 11:49 AM

## 2017-08-12 ENCOUNTER — Encounter: Payer: Self-pay | Admitting: *Deleted

## 2017-08-12 ENCOUNTER — Ambulatory Visit (HOSPITAL_COMMUNITY): Payer: Managed Care, Other (non HMO)

## 2017-08-12 ENCOUNTER — Inpatient Hospital Stay (HOSPITAL_COMMUNITY): Admission: RE | Admit: 2017-08-12 | Payer: Managed Care, Other (non HMO) | Source: Ambulatory Visit

## 2017-08-12 ENCOUNTER — Inpatient Hospital Stay (HOSPITAL_COMMUNITY): Payer: Managed Care, Other (non HMO)

## 2017-08-12 ENCOUNTER — Ambulatory Visit
Admission: RE | Admit: 2017-08-12 | Discharge: 2017-08-12 | Disposition: A | Discharge status: Home / Self Care | Payer: Managed Care, Other (non HMO) | Source: Ambulatory Visit | Attending: Radiation Oncology | Admitting: Radiation Oncology

## 2017-08-12 ENCOUNTER — Encounter: Payer: Self-pay | Admitting: Urology

## 2017-08-12 LAB — CBC WITH DIFFERENTIAL/PLATELET
BASOS ABS: 0 10*3/uL (ref 0.0–0.1)
Basophils Relative: 0 %
Eosinophils Absolute: 0 10*3/uL (ref 0.0–0.7)
Eosinophils Relative: 1 %
HCT: 25.4 % — ABNORMAL LOW (ref 39.0–52.0)
Hemoglobin: 8.5 g/dL — ABNORMAL LOW (ref 13.0–17.0)
LYMPHS ABS: 0.2 10*3/uL — AB (ref 0.7–4.0)
LYMPHS PCT: 13 %
MCH: 32 pg (ref 26.0–34.0)
MCHC: 33.5 g/dL (ref 30.0–36.0)
MCV: 95.5 fL (ref 78.0–100.0)
Monocytes Absolute: 0.3 10*3/uL (ref 0.1–1.0)
Monocytes Relative: 14 %
NEUTROS ABS: 1.4 10*3/uL — AB (ref 1.7–7.7)
NEUTROS PCT: 72 %
Platelets: 188 10*3/uL (ref 150–400)
RBC: 2.66 MIL/uL — AB (ref 4.22–5.81)
RDW: 14 % (ref 11.5–15.5)
WBC: 1.9 10*3/uL — AB (ref 4.0–10.5)

## 2017-08-12 LAB — BASIC METABOLIC PANEL
ANION GAP: 7 (ref 5–15)
CHLORIDE: 99 mmol/L — AB (ref 101–111)
CO2: 34 mmol/L — ABNORMAL HIGH (ref 22–32)
Calcium: 8.7 mg/dL — ABNORMAL LOW (ref 8.9–10.3)
Creatinine, Ser: 0.39 mg/dL — ABNORMAL LOW (ref 0.61–1.24)
GFR calc Af Amer: >60 mL/min (ref >60)
GLUCOSE: 187 mg/dL — AB (ref 65–99)
POTASSIUM: 3.6 mmol/L (ref 3.5–5.1)
Sodium: 140 mmol/L (ref 135–145)

## 2017-08-12 LAB — GLUCOSE, CAPILLARY: GLUCOSE-CAPILLARY: 181 mg/dL — AB (ref 65–99)

## 2017-08-12 LAB — PHOSPHORUS: Phosphorus: 3.1 mg/dL (ref 2.5–4.6)

## 2017-08-12 LAB — MAGNESIUM: MAGNESIUM: 1.6 mg/dL — AB (ref 1.7–2.4)

## 2017-08-12 MED ORDER — FENTANYL 75 MCG/HR TD PT72
75.0000 ug | MEDICATED_PATCH | TRANSDERMAL | Status: DC
  Administered 2017-08-12 – 2017-08-15 (×2): 75 ug via TRANSDERMAL
  Filled 2017-08-12 (×2): qty 1

## 2017-08-12 MED ORDER — HYDROMORPHONE HCL 4 MG PO TABS
4.0000 mg | ORAL_TABLET | ORAL | Status: DC | PRN
  Administered 2017-08-12 – 2017-08-16 (×14): 4 mg
  Filled 2017-08-12 (×2): qty 1
  Filled 2017-08-12: qty 2
  Filled 2017-08-12 (×2): qty 1
  Filled 2017-08-12: qty 2
  Filled 2017-08-12: qty 1
  Filled 2017-08-12: qty 2
  Filled 2017-08-12: qty 1
  Filled 2017-08-12: qty 2
  Filled 2017-08-12: qty 1
  Filled 2017-08-12: qty 2
  Filled 2017-08-12: qty 1
  Filled 2017-08-12 (×2): qty 2

## 2017-08-12 MED ORDER — TRAZODONE HCL 50 MG PO TABS
50.0000 mg | ORAL_TABLET | Freq: Every day | ORAL | Status: DC
Start: 2017-08-12 — End: 2017-08-17
  Administered 2017-08-12 – 2017-08-16 (×5): 50 mg
  Filled 2017-08-12 (×5): qty 1

## 2017-08-12 MED ORDER — MAGNESIUM SULFATE 2 GM/50ML IV SOLN
2.0000 g | Freq: Once | INTRAVENOUS | Status: AC
  Administered 2017-08-12: 2 g via INTRAVENOUS
  Filled 2017-08-12: qty 50

## 2017-08-12 MED ORDER — HYDROMORPHONE HCL-NACL 0.5-0.9 MG/ML-% IV SOSY
1.0000 mg | PREFILLED_SYRINGE | INTRAVENOUS | Status: DC | PRN
  Administered 2017-08-15 – 2017-08-17 (×3): 1 mg via INTRAVENOUS
  Filled 2017-08-12 (×4): qty 2

## 2017-08-12 NOTE — Progress Notes (Signed)
PROGRESS NOTE  BASEL DEFALCO  POE:[id number] DOB: Sep 22, 1966 DOA: 08/07/2017   PCP: Cassandria Anger, MD    Brief Narrative:  51 y.o. male with medical history significant of stage-III squamous cell lung cancer, squamous cell cancer of the epiglottis, currently on chemotherapy and radiation therapy, hypertension, COPD, GERD, depression, anxiety, tobacco abuse, who presents with dysphagia, generalized weakness and SOB. Patient states that he has dysphagia because of epiglottis cancer and radiation. He can only eat little liquid food, and has significantly decreased appetite and oral intake recently. Patient was admitted for further pain management as well as PEG placement for poor nutrition. PEG was placed 10/1. Due to ongoing pain, palliative care was consulted for symptom management.  Assessment & Plan:   Dehydration, moderate protein-calorie malnutrition secondary to dysphagia result of epiglottis cancer, radiation therapy - IR consulted, PEG placement 10/1  - diet as tolerated, full liquid - Dietitian consult to manage tube feeding. Monitor electrolytes, at risk of refeeding syndrome - has been on dilaudid PCA but somewhat hypoxic this AM, plan to taper off PCA and change to TD Fent and oral hydromorphone  - appreciate PCT following   Stage III squamous cell carcinoma of left lung - Follows with Dr. Julien Nordmann  COPD - respiratory status notable for hypoxia but no tachypnea noted - monitor and plan to taper off PCA, suspect this will help with oxygen saturations   GERD - IV pepcid  Generalized anxiety disorder - Continue home klonopin  HTN - hydralazine as needed IV  Leukopenia - slowly improving  Hypomagnesemia - supplement via IV  - check Mg in AM   Tobacco abuse - Nicotine patch, cessation counseling   Constipation - Bowel regimen   DVT prophylaxis: SCD Code Status: Full Family Communication: pt updated at bedside  Disposition Plan: to be determined    Consultants:   IR  Palliative care   Procedures:   None  Antimicrobials:  Anti-infectives    Start     Dose/Rate Route Frequency Ordered Stop   08/10/17 0600  ceFAZolin (ANCEF) IVPB 2g/100 mL premix     2 g 200 mL/hr over 30 Minutes Intravenous To Radiology 08/09/17 0826 08/10/17 0745      Subjective: Pt reports pain is better controlled but says may be too strong for him.   Objective: Vitals:   08/12/17 0410 08/12/17 0800 08/12/17 1029 08/12/17 1200  BP: 117/66  (!) 107/55   Pulse: (!) 108  84   Resp: 17 18 13 17   Temp: 98.8 F (37.1 C)  99 F (37.2 C)   TempSrc: Oral  Oral   SpO2: 97% 94% 92% 97%  Weight:      Height:        Intake/Output Summary (Last 24 hours) at 08/12/17 1300 Last data filed at 08/12/17 0433  Gross per 24 hour  Intake                0 ml  Output              350 ml  Net             -350 ml   Filed Weights   08/07/17 1801 08/08/17 0657  Weight: 68 kg (150 lb) 69.7 kg (153 lb 9.6 oz)    Examination:  General exam: calm, NAD Respiratory system: no tachypnea, stable resp status  Cardiovascular system: RRR, no murmurs  Gastrointestinal system: PEG in place, no abd distension  Central nervous system: alert and oriented, strength  equal bilaterally in upper and lower extremities   Data Reviewed: I have personally reviewed following data:  CBC:  Recent Labs Lab 08/07/17 1833 08/08/17 0436 08/09/17 1030 08/10/17 0314 08/11/17 0418 08/12/17 0320  WBC 2.4* 1.5* 1.3* 1.2* 1.6* 1.9*  NEUTROABS 2.0  --  1.0* 0.8* 1.1* 1.4*  HGB 11.3* 9.5* 8.8* 9.5* 9.1* 8.5*  HCT 33.1* 27.3* 25.1* 27.5* 26.7* 25.4*  MCV 93.8 92.2 91.6 93.2 92.4 95.5  PLT 184 181 174 188 176 638   Basic Metabolic Panel:  Recent Labs Lab 08/08/17 0436 08/09/17 1030 08/10/17 0314 08/11/17 0418 08/12/17 0320  NA 142 137 141 138 140  K 3.5 2.9* 3.3* 3.8 3.6  CL 109 104 106 103 99*  CO2 27 27 28 29  34*  GLUCOSE 80 89 90 115* 187*  BUN 9 <5* <5* <5* <5*   CREATININE 0.44* 0.39* 0.45* 0.45* 0.39*  CALCIUM 8.5* 8.0* 8.3* 8.6* 8.7*  MG 1.9 1.6* 1.9 1.8 1.6*  PHOS  --   --   --  3.1 3.1   Liver Function Tests:  Recent Labs Lab 08/07/17 1833  AST 20  ALT 43  ALKPHOS 58  BILITOT 1.0  PROT 6.1*  ALBUMIN 3.1*   Coagulation Profile:  Recent Labs Lab 08/07/17 2327  INR 1.13   CBG:  Recent Labs Lab 08/08/17 0911 08/09/17 0742 08/10/17 0753 08/11/17 0826 08/12/17 0812  GLUCAP 82 86 127* 161* 181*   Sepsis Labs:  Recent Labs Lab 08/07/17 1845  LATICACIDVEN 0.49*    Radiology Studies: No results found.  Scheduled Meds: . clonazePAM  1 mg Per Tube BID  . feeding supplement  1 Container Oral TID BM  . fluticasone furoate-vilanterol  1 puff Inhalation Daily  . HYDROmorphone   Intravenous Q4H  . multivitamin  15 mL Oral Daily  . nicotine  21 mg Transdermal Daily  . polyethylene glycol  17 g Oral Daily  . sennosides  5 mL Oral QHS  . traZODone  100 mg Per Tube QHS   Continuous Infusions: . feeding supplement (OSMOLITE 1.5 CAL) 1,000 mL (08/11/17 2308)     LOS: 5 days   Time spent: 25 minutes   Faye Ramsay, MD [phone]  [facility] [email or web address] Password [email or web address] 08/12/2017, 1:00 PM

## 2017-08-12 NOTE — Progress Notes (Signed)
Patient evaluated by Dr. Tammi Klippel and I prior to his scheduled radiotherapy treatment today.  Patient appears hyper-sedated with slurred speech and pin-point pupils but is alert and oriented x3 and appropriately answers questions throughout the visit.  He continues with moderate dysphagia and throat pain despite current pain regimen. He has a wet cough but denies hemoptysis or colored sputum.  He does report increased SOB but denies pleuritic chest pain or dyspnea.  He is breathing with normal effort and O2 saturation is 95%.  His PEG tube is in place and dressing is C/D/I.  He has mild discomfort at the insertion site. There is mild erythema of the upper chest and dry desquamation on the neck from radiotherapy.  Cardio assessment reveals a regular rate/rhythym.  There are diffuse rhonchi throughout the lung fields which partially clear with cough. He feels up for treatment today and will proceed as scheduled unless there is significant effusion or concerning findings on setup imaging but this imaging will be limited to the area being treated. He will need formal evaluation with CXR to r/o PNA or progressive pneumonitis.  Appreciate assistance from palliative care with pain control and tolerance.  We will plan to continue with radiotherapy unless there is a significant deterioration in his performance status or concerning findings on imaging studies.    Nicholos Johns, PA-C

## 2017-08-12 NOTE — Progress Notes (Signed)
Hydromorphone PCA 20 mg wasted and witnessed by Wess Botts RN

## 2017-08-12 NOTE — Progress Notes (Signed)
Oncology Nurse Navigator Documentation  Met with Trevor Zavala [id number] to review PEG instruction provided Monday.  His mother was bed-side.  Presently on pump.  I assured him I will provide follow-up support re bolus use of PEG s/p discharge and his return to [facility] for treatment.  He voiced understanding/appreciation.  Gayleen Orem, RN, BSN, [place] [facility] Oncology Nurse [place] [facility] [place] [phone]

## 2017-08-12 NOTE — Progress Notes (Signed)
Pt oxygen sats 80-83 on 6L [place] despite efforts to take deep breaths, incentive spirometry, etc.  Place on 40% venturi mask and sats rose to 98%.  MD notified. Will continue to monitor.

## 2017-08-12 NOTE — Progress Notes (Signed)
Folllowed up on [name] he appears to be disoriented and having issues with hypoxia- he has chest congestion and does not appear to be doing well this afternoon- this is a big change from last PM. He has used max amounts of PCA hydromorphone- I suspect he is dosing for comfort not just physical pain - he is extremely anxious at baseline. I am concerned about disease progression, PNA or possible pneumonitis. He is getting ready to go down to radiation onc for tx- I have called down there and asked that the Rad Onc evaluate him for whether or not we should proceed with tx given his congestion and hypoxia.   I spoke with both the patients wife and his mother - we need to have a goals of care conversation and frankly discuss things like code status and what happens if he gets worse- he has extensive disease.  Recommendations:  1. Transition PCA to TD Fentanyl- he was getting 18mg  of dilaudid in a 24 hour time period- will reduce this by half and start him on equianalgesic dose of 68mcg/hr, will use oral dilaudid per tube for breakthrough pain. Ok to stop PCA once TD is placed.  2. I ordered a CXR. May need steroids or abx.  3. Needs a goals of care conversation- he has not been ready to do this yet,  [name], [name]

## 2017-08-13 ENCOUNTER — Encounter (HOSPITAL_COMMUNITY): Payer: Self-pay | Admitting: Radiology

## 2017-08-13 ENCOUNTER — Inpatient Hospital Stay (HOSPITAL_COMMUNITY): Payer: Managed Care, Other (non HMO)

## 2017-08-13 ENCOUNTER — Ambulatory Visit
Admission: RE | Admit: 2017-08-13 | Discharge: 2017-08-13 | Disposition: A | Discharge status: Home / Self Care | Payer: Managed Care, Other (non HMO) | Source: Ambulatory Visit | Attending: Radiation Oncology | Admitting: Radiation Oncology

## 2017-08-13 DIAGNOSIS — C321 Malignant neoplasm of supraglottis: Secondary | ICD-10-CM

## 2017-08-13 LAB — PROTIME-INR
INR: 1.14
Prothrombin Time: 14.5 seconds (ref 11.4–15.2)

## 2017-08-13 LAB — CBC WITH DIFFERENTIAL/PLATELET
BASOS PCT: 0 %
Basophils Absolute: 0 10*3/uL (ref 0.0–0.1)
EOS PCT: 1 %
Eosinophils Absolute: 0 10*3/uL (ref 0.0–0.7)
HEMATOCRIT: 25.6 % — AB (ref 39.0–52.0)
Hemoglobin: 8.5 g/dL — ABNORMAL LOW (ref 13.0–17.0)
LYMPHS ABS: 0.1 10*3/uL — AB (ref 0.7–4.0)
Lymphocytes Relative: 10 %
MCH: 31.3 pg (ref 26.0–34.0)
MCHC: 33.2 g/dL (ref 30.0–36.0)
MCV: 94.1 fL (ref 78.0–100.0)
MONOS PCT: 25 %
Monocytes Absolute: 0.3 10*3/uL (ref 0.1–1.0)
NEUTROS ABS: 0.9 10*3/uL — AB (ref 1.7–7.7)
Neutrophils Relative %: 64 %
Platelets: 193 10*3/uL (ref 150–400)
RBC: 2.72 MIL/uL — AB (ref 4.22–5.81)
RDW: 13.9 % (ref 11.5–15.5)
WBC: 1.3 10*3/uL — CL (ref 4.0–10.5)

## 2017-08-13 LAB — RNA QUALITATIVE

## 2017-08-13 LAB — LACTIC ACID, PLASMA
Lactic Acid, Venous: 0.5 mmol/L (ref 0.5–1.9)
Lactic Acid, Venous: 0.9 mmol/L (ref 0.5–1.9)

## 2017-08-13 LAB — BASIC METABOLIC PANEL
ANION GAP: 4 — AB (ref 5–15)
BUN: 6 mg/dL (ref 6–20)
CALCIUM: 8.6 mg/dL — AB (ref 8.9–10.3)
CO2: 38 mmol/L — AB (ref 22–32)
Chloride: 97 mmol/L — ABNORMAL LOW (ref 101–111)
Creatinine, Ser: 0.42 mg/dL — ABNORMAL LOW (ref 0.61–1.24)
GFR calc Af Amer: >60 mL/min (ref >60)
GFR calc non Af Amer: >60 mL/min (ref >60)
GLUCOSE: 195 mg/dL — AB (ref 65–99)
Potassium: 3.7 mmol/L (ref 3.5–5.1)
Sodium: 139 mmol/L (ref 135–145)

## 2017-08-13 LAB — GLUCOSE, CAPILLARY: Glucose-Capillary: 250 mg/dL — ABNORMAL HIGH (ref 65–99)

## 2017-08-13 LAB — HIV 1/2 AB DIFFERENTIATION
HIV 1 AB: NEGATIVE
HIV 2 Ab: NEGATIVE
NOTE (HIV CONF MULTISPOT): NEGATIVE

## 2017-08-13 LAB — APTT: APTT: 42 s — AB (ref 24–36)

## 2017-08-13 LAB — MAGNESIUM: Magnesium: 1.9 mg/dL (ref 1.7–2.4)

## 2017-08-13 LAB — MRSA PCR SCREENING: MRSA by PCR: NEGATIVE

## 2017-08-13 LAB — PHOSPHORUS: PHOSPHORUS: 3.5 mg/dL (ref 2.5–4.6)

## 2017-08-13 LAB — HIV ANTIBODY (ROUTINE TESTING W REFLEX): HIV SCREEN 4TH GENERATION: REACTIVE — AB

## 2017-08-13 LAB — PROCALCITONIN

## 2017-08-13 MED ORDER — MAGNESIUM SULFATE 2 GM/50ML IV SOLN
2.0000 g | Freq: Once | INTRAVENOUS | Status: AC
  Administered 2017-08-13: 2 g via INTRAVENOUS
  Filled 2017-08-13: qty 50

## 2017-08-13 MED ORDER — GUAIFENESIN 100 MG/5ML PO SOLN
200.0000 mg | Freq: Three times a day (TID) | ORAL | Status: DC
  Administered 2017-08-13 – 2017-08-14 (×3): 200 mg via ORAL
  Filled 2017-08-13 (×4): qty 10

## 2017-08-13 MED ORDER — PIPERACILLIN-TAZOBACTAM 3.375 G IVPB
3.3750 g | Freq: Three times a day (TID) | INTRAVENOUS | Status: DC
  Administered 2017-08-13 – 2017-08-17 (×12): 3.375 g via INTRAVENOUS
  Filled 2017-08-13 (×14): qty 50

## 2017-08-13 MED ORDER — ENOXAPARIN SODIUM 40 MG/0.4ML ~~LOC~~ SOLN
40.0000 mg | Freq: Every day | SUBCUTANEOUS | Status: DC
  Administered 2017-08-13 – 2017-08-16 (×4): 40 mg via SUBCUTANEOUS
  Filled 2017-08-13 (×4): qty 0.4

## 2017-08-13 MED ORDER — HEPARIN SOD (PORK) LOCK FLUSH 100 UNIT/ML IV SOLN
INTRAVENOUS | Status: AC
  Filled 2017-08-13: qty 5

## 2017-08-13 MED ORDER — SODIUM CHLORIDE 0.9 % IV SOLN: INTRAVENOUS | Status: DC

## 2017-08-13 MED ORDER — HEPARIN SOD (PORK) LOCK FLUSH 100 UNIT/ML IV SOLN
500.0000 [IU] | Freq: Once | INTRAVENOUS | Status: AC
  Administered 2017-08-13: 500 [IU] via INTRAVENOUS

## 2017-08-13 MED ORDER — DEXAMETHASONE SODIUM PHOSPHATE 4 MG/ML IJ SOLN
4.0000 mg | INTRAMUSCULAR | Status: DC
  Administered 2017-08-13 – 2017-08-15 (×3): 4 mg via INTRAVENOUS
  Filled 2017-08-13 (×3): qty 1

## 2017-08-13 MED ORDER — CLONAZEPAM 0.5 MG PO TABS
0.5000 mg | ORAL_TABLET | Freq: Two times a day (BID) | ORAL | Status: DC
  Administered 2017-08-13 – 2017-08-17 (×8): 0.5 mg
  Filled 2017-08-13 (×8): qty 1

## 2017-08-13 MED ORDER — CHLORHEXIDINE GLUCONATE 0.12 % MT SOLN
15.0000 mL | Freq: Two times a day (BID) | OROMUCOSAL | Status: DC
  Administered 2017-08-13 – 2017-08-17 (×8): 15 mL via OROMUCOSAL
  Filled 2017-08-13 (×8): qty 15

## 2017-08-13 MED ORDER — ENOXAPARIN SODIUM 40 MG/0.4ML ~~LOC~~ SOLN: 40.0000 mg | Freq: Every day | SUBCUTANEOUS | Status: DC

## 2017-08-13 MED ORDER — IOPAMIDOL (ISOVUE-370) INJECTION 76%
100.0000 mL | Freq: Once | INTRAVENOUS | Status: AC | PRN
  Administered 2017-08-13: 100 mL via INTRAVENOUS

## 2017-08-13 MED ORDER — TBO-FILGRASTIM 480 MCG/0.8ML ~~LOC~~ SOSY
480.0000 ug | PREFILLED_SYRINGE | Freq: Once | SUBCUTANEOUS | Status: AC
Start: 2017-08-13 — End: 2017-08-13
  Administered 2017-08-13: 480 ug via SUBCUTANEOUS
  Filled 2017-08-13: qty 0.8

## 2017-08-13 MED ORDER — ORAL CARE MOUTH RINSE
15.0000 mL | Freq: Two times a day (BID) | OROMUCOSAL | Status: DC
  Administered 2017-08-14 – 2017-08-16 (×5): 15 mL via OROMUCOSAL

## 2017-08-13 MED ORDER — IOPAMIDOL (ISOVUE-370) INJECTION 76%
INTRAVENOUS | Status: AC
  Filled 2017-08-13: qty 100

## 2017-08-13 MED ORDER — CHLORHEXIDINE GLUCONATE CLOTH 2 % EX PADS
6.0000 | MEDICATED_PAD | Freq: Every day | CUTANEOUS | Status: DC
  Administered 2017-08-14 – 2017-08-17 (×3): 6 via TOPICAL

## 2017-08-13 NOTE — Progress Notes (Addendum)
[place] chaplain received voicemail from pt's spouse requesting notarize advance directive.   This chaplain consulted with palliative medicine who met with pt earlier today.  Worked with pt to notarize AD.   Original and two copies with pt.    Copy placed in pt chart.     Provided brief emotional support around progression of illness.     [facility] / [place], MDiv

## 2017-08-13 NOTE — Progress Notes (Addendum)
Palliative Care Progress Note  Patient mentally more alert but he looks like he is having dyspnea, he is tachycardic and I was in the room for an extended period of time in which his sats did not get above 88, despite being on a full face mask. He is extremely congested and spitting up copious amounts of mucous. His pain score doesn't change from a 7 or 8 despite dose of medication. His wife says he slept well last PM, I have cut back slightly on his PRN doses of hydromorphone. Reduced his dose of Klonopin slightly. He looks like he is sedated. Our goals are aggressive at this time- he has almost completed chemo and radiation - there are family dynamics and wife is requesting all conversations be had with only she and her husband - she wants to be called by providers and updated if she is not in the room.  I called Hospitalist to discuss his current situation- I do not believe that his current situation is related entirely to pain and anxiety medications. He is showing signs of decompensation of his respiratory status not related to opioids- additionally he has been consistently nuetropenic for days and is high risk for sepsis in addition to radiation pneumonitis.  Recommended CT Angio, Started decadron, recommended empiric abx give his severe nuetropenia- discussed transfer to step down.  I requested that we discuss goals of care including code status -his wife wants the AD signed but I explained while that was important it does not address code status. For now they are deferring that conversation and she hopes patient will be well enough to make the decisions himself.  [name], DO Palliative Medicine [phone]  Time:35 min Greater than 50%  of this time was spent counseling and coordinating care related to the above assessment and plan.

## 2017-08-13 NOTE — Progress Notes (Signed)
Pt plan to discharge home. Will continue to follow for discharge needs.

## 2017-08-13 NOTE — Progress Notes (Addendum)
PROGRESS NOTE  Trevor Zavala  ZDG:[id number] DOB: 04-11-1966 DOA: 08/07/2017   PCP: Cassandria Anger, MD    Brief Narrative:  51 y.o. male with medical history significant of stage-III squamous cell lung cancer, squamous cell cancer of the epiglottis, currently on chemotherapy and radiation therapy, hypertension, COPD, GERD, depression, anxiety, tobacco abuse, who presents with dysphagia, generalized weakness and SOB. Patient states that he has dysphagia because of epiglottis cancer and radiation. He can only eat little liquid food, and has significantly decreased appetite and oral intake recently. Patient was admitted for further pain management as well as PEG placement for poor nutrition. PEG was placed 10/1. Due to ongoing pain, palliative care was consulted for symptom management.  Assessment & Plan:   Dehydration, moderate protein-calorie malnutrition secondary to dysphagia result of epiglottis cancer, radiation therapy - IR consulted, PEG placement 10/1  - diet as tolerated, full liquid - Dietitian consult to manage tube feeding. Monitor electrolytes, at risk of refeeding syndrome - has been on dilaudid PCA and was transitioned to Fent TD and oral analgesia as needed - can use IV analgesia as needed while inpatient  - appreciate PCT following   Acute on chronic resp failure with hypoxia, pt with known COPD - pt reports feeling worse today, more dyspnea in the past 24 hours - unable to taper of venturi mask as pt desaturates down to 60's - CXR on 10/03 with bibasilar atelectasis, but nore rhonchi on exam this AM - pt is at high risk of clinical deterioration, high risk PE - could also be radiation pneumonitis  - will repeat CT chest angio to rule out PE - transfer pt to SDU due to ongoing hypoxia and close monitoring  - place on Lovenox for DVT prophylaxis - empiric Zosyn for now until until an infectious etiology ruled out   Stage III squamous cell carcinoma of left lung -  Follows with Dr. Julien Nordmann - appreciate assistance   GERD - IV pepcid  Generalized anxiety disorder - Continue home klonopin  HTN - hydralazine as needed IV  Leukopenia - WBC still low, pt with tachy and high risk of clinical deterioration - sepsis order set in place as pt is tachy and with low WBC, immunocompromised  - place on empiric Zosyn for now - blood cultures, obtained - CT angio as noted above - transfer to SDU  - give dose of Neupogen   Hypomagnesemia - supplement via IV  - Mg is on low end of normal, will give one more dose today in an effort to keep Mg ~2 - check Mg in AM   Tobacco abuse - Nicotine patch, cessation counseling   Constipation - Bowel regimen   DVT prophylaxis: SCD Code Status: Full Family Communication: updated pt at bedside  Disposition Plan: to be determined, currently critically ill, needs transfer to SDU  Consultants:   IR  Palliative care   Oncology   Procedures:   None  Antimicrobials:  Anti-infectives    Start     Dose/Rate Route Frequency Ordered Stop   08/10/17 0600  ceFAZolin (ANCEF) IVPB 2g/100 mL premix     2 g 200 mL/hr over 30 Minutes Intravenous To Radiology 08/09/17 0826 08/10/17 0745     Subjective: Patient reports more dyspnea this AM.   Objective: Vitals:   08/13/17 0303 08/13/17 0535 08/13/17 0829 08/13/17 0911  BP: (!) 113/58 128/61 (!) 114/55   Pulse: (!) 106 (!) 102 (!) 112   Resp: 20 20 20  Temp: 98.5 F (36.9 C) 98.1 F (36.7 C) 98.1 F (36.7 C)   TempSrc: Oral Oral Oral   SpO2: 94% 93% 91% 93%  Weight:      Height:        Intake/Output Summary (Last 24 hours) at 08/13/17 1240 Last data filed at 08/13/17 0700  Gross per 24 hour  Intake             1730 ml  Output             1200 ml  Net              530 ml   Filed Weights   08/07/17 1801 08/08/17 0657  Weight: 68 kg (150 lb) 69.7 kg (153 lb 9.6 oz)    Physical Exam  Constitutional: Appears to be in mild distress due to dyspnea    CVS: tachycardic, no murmurs, no gallops, no carotid bruit.  Pulmonary: diffuse rhonchi, diminished air movement at bases  Abdominal: Soft. BS +,  no distension, PEG in place Neuro: Alert. Normal reflexes, muscle tone coordination. No cranial nerve deficit.  Data Reviewed: I have personally reviewed following data:  CBC:  Recent Labs Lab 08/09/17 1030 08/10/17 0314 08/11/17 0418 08/12/17 0320 08/13/17 0427  WBC 1.3* 1.2* 1.6* 1.9* 1.3*  NEUTROABS 1.0* 0.8* 1.1* 1.4* 0.9*  HGB 8.8* 9.5* 9.1* 8.5* 8.5*  HCT 25.1* 27.5* 26.7* 25.4* 25.6*  MCV 91.6 93.2 92.4 95.5 94.1  PLT 174 188 176 188 706   Basic Metabolic Panel:  Recent Labs Lab 08/09/17 1030 08/10/17 0314 08/11/17 0418 08/12/17 0320 08/13/17 0427  NA 137 141 138 140 139  K 2.9* 3.3* 3.8 3.6 3.7  CL 104 106 103 99* 97*  CO2 27 28 29  34* 38*  GLUCOSE 89 90 115* 187* 195*  BUN <5* <5* <5* <5* 6  CREATININE 0.39* 0.45* 0.45* 0.39* 0.42*  CALCIUM 8.0* 8.3* 8.6* 8.7* 8.6*  MG 1.6* 1.9 1.8 1.6* 1.9  PHOS  --   --  3.1 3.1 3.5   Liver Function Tests:  Recent Labs Lab 08/07/17 1833  AST 20  ALT 43  ALKPHOS 58  BILITOT 1.0  PROT 6.1*  ALBUMIN 3.1*   Coagulation Profile:  Recent Labs Lab 08/07/17 2327  INR 1.13   CBG:  Recent Labs Lab 08/09/17 0742 08/10/17 0753 08/11/17 0826 08/12/17 0812 08/13/17 0746  GLUCAP 86 127* 161* 181* 250*   Sepsis Labs:  Recent Labs Lab 08/07/17 1845  LATICACIDVEN 0.49*   Radiology Studies: Dg Chest Port 1 View  Result Date: 08/12/2017 CLINICAL DATA:  Lung cancer.  Hypoxia. EXAM: PORTABLE CHEST 1 VIEW COMPARISON:  08/07/2017 FINDINGS: Stable right jugular Port-A-Cath. Normal heart size. Low volumes. Bibasilar atelectasis. No pneumothorax. IMPRESSION: Bibasilar atelectasis. Electronically Signed   By: Marybelle Killings M.D.   On: 08/12/2017 16:17   Scheduled Meds: . clonazePAM  1 mg Per Tube BID  . feeding supplement  1 Container Oral TID BM  . fentaNYL  75 mcg  Transdermal Q72H  . fluticasone furoate-vilanterol  1 puff Inhalation Daily  . multivitamin  15 mL Oral Daily  . nicotine  21 mg Transdermal Daily  . polyethylene glycol  17 g Oral Daily  . sennosides  5 mL Oral QHS  . traZODone  50 mg Per Tube QHS   Continuous Infusions: . feeding supplement (OSMOLITE 1.5 CAL) 1,000 mL (08/11/17 2308)     LOS: 6 days   Time spent: 35 minutes   Dezaree Tracey  Magick-Lynzy Rawles, MD [phone]  [facility] [email or web address] Password [email or web address] 08/13/2017, 12:40 PM

## 2017-08-13 NOTE — Progress Notes (Signed)
ANTICOAGULATION CONSULT NOTE - Initial Consult  Pharmacy Consult for enoxaparin Indication: VTE prophylaxis  No Known Allergies  Patient Measurements: Height: 5\' 11"  (180.3 cm) Weight: 153 lb 9.6 oz (69.7 kg) IBW/kg (Calculated) : 75.3 Heparin Dosing Weight:   Vital Signs: Temp: 98.1 F (36.7 C) (10/04 0829) Temp Source: Oral (10/04 0829) BP: 114/55 (10/04 0829) Pulse Rate: 112 (10/04 0829)  Labs:  Recent Labs  08/11/17 0418 08/12/17 0320 08/13/17 0427  HGB 9.1* 8.5* 8.5*  HCT 26.7* 25.4* 25.6*  PLT 176 188 193  CREATININE 0.45* 0.39* 0.42*    Estimated Creatinine Clearance: 106.5 mL/min (A) (by C-G formula based on SCr of 0.42 mg/dL (L)).   Medical History: Past Medical History:  Diagnosis Date  . Anxiety   . COPD (chronic obstructive pulmonary disease) ([place])   . GERD (gastroesophageal reflux disease)   . Headache   . Hypertension   . Pneumonia   . Situational depression   . Stage III squamous cell carcinoma of left lung ([place]) 06/19/2017    Assessment: 62 YOM with known lung cancer undergoing chemo and radiation therapies.  Pharmacy asked to dose enoxaparin for VTE prophylaxis   CBC: Hgb decreased but stable, pltc WNL  Renal: SCr WNL  Goal of Therapy:  Anti-Xa 0.3-0.6 (not commonly required to check) Dose per renal function and pt weight   Plan:   Enoxaparin 40mg  SQ q24h based on renal function and weight  Do not anticipate need for adjustment, pharmacy to [place], PharmD, BCPS.   Pager: [phone] 08/13/2017 1:26 PM

## 2017-08-13 NOTE — Plan of Care (Signed)
Problem: Nutrition: Goal: Adequate nutrition will be maintained Outcome: Progressing Pt on tube feeding at 7ml/hr continuous.   Will continue with plan of care.

## 2017-08-14 ENCOUNTER — Encounter: Payer: Self-pay | Admitting: *Deleted

## 2017-08-14 ENCOUNTER — Ambulatory Visit: Payer: Managed Care, Other (non HMO)

## 2017-08-14 ENCOUNTER — Ambulatory Visit
Admission: RE | Admit: 2017-08-14 | Discharge: 2017-08-14 | Disposition: A | Discharge status: Home / Self Care | Payer: Managed Care, Other (non HMO) | Source: Ambulatory Visit | Attending: Radiation Oncology | Admitting: Radiation Oncology

## 2017-08-14 DIAGNOSIS — E43 Unspecified severe protein-calorie malnutrition: Secondary | ICD-10-CM

## 2017-08-14 DIAGNOSIS — C321 Malignant neoplasm of supraglottis: Principal | ICD-10-CM

## 2017-08-14 DIAGNOSIS — E876 Hypokalemia: Secondary | ICD-10-CM

## 2017-08-14 DIAGNOSIS — C3412 Malignant neoplasm of upper lobe, left bronchus or lung: Secondary | ICD-10-CM

## 2017-08-14 DIAGNOSIS — I10 Essential (primary) hypertension: Secondary | ICD-10-CM

## 2017-08-14 DIAGNOSIS — R1312 Dysphagia, oropharyngeal phase: Secondary | ICD-10-CM

## 2017-08-14 DIAGNOSIS — F17200 Nicotine dependence, unspecified, uncomplicated: Secondary | ICD-10-CM

## 2017-08-14 DIAGNOSIS — J449 Chronic obstructive pulmonary disease, unspecified: Secondary | ICD-10-CM

## 2017-08-14 DIAGNOSIS — F411 Generalized anxiety disorder: Secondary | ICD-10-CM

## 2017-08-14 LAB — BASIC METABOLIC PANEL
Anion gap: 7 (ref 5–15)
BUN: 9 mg/dL (ref 6–20)
CALCIUM: 8.8 mg/dL — AB (ref 8.9–10.3)
CO2: 33 mmol/L — AB (ref 22–32)
CREATININE: 0.44 mg/dL — AB (ref 0.61–1.24)
Chloride: 99 mmol/L — ABNORMAL LOW (ref 101–111)
GFR calc non Af Amer: >60 mL/min (ref >60)
Glucose, Bld: 296 mg/dL — ABNORMAL HIGH (ref 65–99)
Potassium: 4.3 mmol/L (ref 3.5–5.1)
Sodium: 139 mmol/L (ref 135–145)

## 2017-08-14 LAB — GLUCOSE, CAPILLARY: Glucose-Capillary: 296 mg/dL — ABNORMAL HIGH (ref 65–99)

## 2017-08-14 LAB — CBC
HEMATOCRIT: 23.6 % — AB (ref 39.0–52.0)
Hemoglobin: 8.1 g/dL — ABNORMAL LOW (ref 13.0–17.0)
MCH: 32.5 pg (ref 26.0–34.0)
MCHC: 34.3 g/dL (ref 30.0–36.0)
MCV: 94.8 fL (ref 78.0–100.0)
Platelets: 203 10*3/uL (ref 150–400)
RBC: 2.49 MIL/uL — ABNORMAL LOW (ref 4.22–5.81)
RDW: 13.6 % (ref 11.5–15.5)
WBC: 3.2 10*3/uL — ABNORMAL LOW (ref 4.0–10.5)

## 2017-08-14 LAB — MAGNESIUM: MAGNESIUM: 2 mg/dL (ref 1.7–2.4)

## 2017-08-14 LAB — PHOSPHORUS: PHOSPHORUS: 1.9 mg/dL — AB (ref 2.5–4.6)

## 2017-08-14 MED ORDER — GUAIFENESIN 100 MG/5ML PO SOLN
200.0000 mg | Freq: Three times a day (TID) | ORAL | Status: DC
  Administered 2017-08-14 – 2017-08-17 (×9): 200 mg
  Filled 2017-08-14 (×5): qty 10
  Filled 2017-08-14: qty 20
  Filled 2017-08-14 (×3): qty 10

## 2017-08-14 MED ORDER — ADULT MULTIVITAMIN LIQUID CH
15.0000 mL | Freq: Every day | ORAL | Status: DC
  Administered 2017-08-15 – 2017-08-17 (×3): 15 mL
  Filled 2017-08-14 (×3): qty 15

## 2017-08-14 MED ORDER — SENNOSIDES 8.8 MG/5ML PO SYRP
5.0000 mL | ORAL_SOLUTION | Freq: Every day | ORAL | Status: DC
  Administered 2017-08-14 – 2017-08-16 (×3): 5 mL
  Filled 2017-08-14 (×3): qty 5

## 2017-08-14 MED ORDER — SODIUM CHLORIDE 0.9 % IV BOLUS (SEPSIS)
500.0000 mL | Freq: Once | INTRAVENOUS | Status: AC
  Administered 2017-08-14: 500 mL via INTRAVENOUS

## 2017-08-14 MED ORDER — RANITIDINE HCL 150 MG/10ML PO SYRP
150.0000 mg | ORAL_SOLUTION | Freq: Two times a day (BID) | ORAL | Status: DC
  Administered 2017-08-14 – 2017-08-17 (×6): 150 mg via ORAL
  Filled 2017-08-14 (×6): qty 10

## 2017-08-14 MED ORDER — LORAZEPAM 2 MG/ML IJ SOLN
0.5000 mg | INTRAMUSCULAR | Status: DC
  Administered 2017-08-14 – 2017-08-15 (×8): 0.5 mg via INTRAVENOUS
  Filled 2017-08-14 (×8): qty 1

## 2017-08-14 MED ORDER — HALOPERIDOL LACTATE 5 MG/ML IJ SOLN
2.0000 mg | Freq: Once | INTRAMUSCULAR | Status: AC
  Administered 2017-08-14: 2 mg via INTRAMUSCULAR

## 2017-08-14 MED ORDER — HALOPERIDOL LACTATE 5 MG/ML IJ SOLN
INTRAMUSCULAR | Status: AC
  Administered 2017-08-14: 2 mg via INTRAMUSCULAR
  Filled 2017-08-14: qty 1

## 2017-08-14 NOTE — Progress Notes (Signed)
Inpatient Diabetes Program Recommendations  AACE/ADA: New Consensus Statement on Inpatient Glycemic Control (2015)  Target Ranges:  Prepandial:   less than 140 mg/dL      Peak postprandial:   less than 180 mg/dL (1-2 hours)      Critically ill patients:  140 - 180 mg/dL   Results for Trevor Zavala, Trevor Zavala (MRN [id number]) as of 08/14/2017 09:34  Ref. Range 08/12/2017 08:12 08/13/2017 07:46 08/14/2017 07:49  Glucose-Capillary Latest Ref Range: 65 - 99 mg/dL 181 (H) 250 (H) 296 (H)   Review of Glycemic Control  Diabetes history: None Current orders for Inpatient glycemic control: None  Inpatient Diabetes Program Recommendations:    Decadron 4 mg Q24 hours started. Glucose increasing into the high 200 range. Consider Novolog Sensitive Correction 0-9 units Q 4 hours. If glucose remains elevated with Correction could also start tube feed coverage Q4 hours.  Thanks,  Tama Headings RN, MSN, [facility] Inpatient Diabetes Coordinator Team Pager [phone] (8a-5p)

## 2017-08-14 NOTE — Progress Notes (Signed)
Spoke with Marita [place], RN caring for patient today in ICU. She reports the patient is stable for treatment per hospitalist. Informed Miranda, RT on L3 of this finding.

## 2017-08-14 NOTE — Progress Notes (Signed)
Oncology Nurse Navigator Documentation  Oncology Nurse Navigator Flowsheets 08/14/2017  Navigator Location [facility]  Navigator Encounter Type Other/I saw patient and family today at [place].  Trevor Zavala looks better today.  He is not quite as confused or lethargic.  I asked how he was feeling and I listened as he explained. I then spoke with wife and updated her on next steps. She was thankful for the update. I will updated Dr. Julien Nordmann.   Patient Visit Type Inpatient  Treatment Phase Treatment  Barriers/Navigation Needs Education  Education Other  Interventions Other;Education  Education Method Verbal  Acuity Level 2  Time Spent with Patient 30

## 2017-08-14 NOTE — Progress Notes (Signed)
  Radiation Oncology         [phone] ________________________________  Name: Trevor Zavala MRN: [id number]  Date: 08/07/2017  DOB: [birth year]  Chart Note:  I reviewed this patient's most recent findings and wanted to take a minute to document my impression.  Appreciate hospital care and support in this very complex oncologic patient.  In reviewing the record, I noted that the CT chest report commented about post-radiation findings.  It should be clarified, that this patient is currently receiving thoracic and larynx radiotherapy, and is not in the post radiation setting.  The phenomenon of radiation pneumonitis occurs 3-12 weeks after radiation treatment.  In this case, radiation pneumonitis is not the likely etiology, would still consider antibiotics and also follow fluid loading status.  It is hoped that this patient can be stabilized.  We will follow and potentially complete treatment, if clinically indicated.  ________________________________  Sheral Apley. Tammi Klippel, M.D.     Current Up-To-Date Excerpt

## 2017-08-14 NOTE — Progress Notes (Signed)
PROGRESS NOTE  Trevor Zavala  SJG:[id number] DOB: 03-12-1966 DOA: 08/07/2017   PCP: Cassandria Anger, MD    Brief Narrative:  51 y.o. male with medical history significant of stage-III squamous cell lung cancer, squamous cell cancer of the epiglottis, currently on chemotherapy and radiation therapy, hypertension, COPD, GERD, depression, anxiety, tobacco abuse, who presents with dysphagia, generalized weakness and SOB. Patient states he can only eat little liquid food, and has significantly decreased appetite and oral intake recently. Patient was admitted for further pain management as well as PEG placement for poor nutrition. PEG was placed 10/1. Due to ongoing pain, palliative care was consulted for symptom management. He had acute respiratory decompensation 10/4 with CTA chest showing infiltrate vs. edema vs. radiation pneumonitis. Zosyn and steroids started with improvement.  Assessment & Plan: Dehydration, moderate protein-calorie malnutrition secondary to dysphagia result of epiglottis cancer, radiation therapy: Improved with PEG 10/1.  - Continue tube feeds and full liquid diet. - Pain control per palliative care, stable on fentanyl patch and prns.   Acute on chronic hypoxic respiratory failure: Due to possible pneumonia (CT showing possible infiltrate, though PCT is undetectable), radiation pneumonitis (not yet in the typical window for this to develop per Dr. Tammi Klippel), aspiration pneumonitis (at high risk), COPD exacerbation, and/or pulmonary edema in addition to cancer disease progression suggested by imaging. No PE.  - Improving over past 24 hours, but still requiring significant oxygen. If not improving in next 24 hours, will ask for pulmonology assistance.  - Continue empiric zosyn, decadron, breathing treatments, venturi mask prn.  - Monitor cultures - Will follow daily weights, consider diuresis if not improving.   Stage III squamous cell carcinoma of left lung - Follows with  Dr. Julien Nordmann who has seen the patient.  - Continue concurrent chemo/XRT.   GERD - IV pepcid  Generalized anxiety disorder - Continue home klonopin  HTN - hydralazine as needed IV  Leukopenia: Improving s/p neupogen  - Monitor  Hypomagnesemia - supplement via IV  - Mg is on low end of normal, will give one more dose today in an effort to keep Mg ~2 - check Mg in AM   Tobacco abuse - Nicotine patch, cessation counseling   Constipation - Bowel regimen   DVT prophylaxis: lovenox Code Status: Full Family Communication: Wife, son updated pt at bedside  Disposition Plan: Continue SDU level of care.  Consultants:   IR  Palliative care   Oncology   Procedures:   None  Antimicrobials:  Zosyn  Subjective: Shortness of breath improving, more alert per family. Overall better but still gets hypoxic quickly off mask. No chest pain or leg swelling.   Objective: Vitals:   08/14/17 1500 08/14/17 1600 08/14/17 1837 08/14/17 1844  BP: (!) 119/57 (!) 111/58 (!) 91/39 115/65  Pulse: 100 97 87 89  Resp: 20 19 20  (!) 23  Temp:  98.7 F (37.1 C)    TempSrc:  Oral    SpO2: 98% 100% 100% 97%  Weight:      Height:        Intake/Output Summary (Last 24 hours) at 08/14/17 1900 Last data filed at 08/14/17 0930  Gross per 24 hour  Intake           1436.5 ml  Output              750 ml  Net            686.5 ml   Filed Weights   08/07/17 1801  08/08/17 0657  Weight: 68 kg (150 lb) 69.7 kg (153 lb 9.6 oz)    Physical Exam  Constitutional: Chronically ill-appearing pleasant 51yo male in no distress CVS: RRR, no murmurs, no gallops, no carotid bruit. No JVD or LE edema noted. Pulmonary: Diffusely funky/rhonchorous diminished at bases with very scant end-expiratory wheezing. Mildly increased effort with conversation.   Abdominal: Soft. BS +,  no distension, PEG in place without discharge.  Neuro: Alert, oriented, speech fluent. No focal sensorimotor deficits noted.  Data  Reviewed: I have personally reviewed following data:  CBC:  Recent Labs Lab 08/09/17 1030 08/10/17 0314 08/11/17 0418 08/12/17 0320 08/13/17 0427 08/14/17 0737  WBC 1.3* 1.2* 1.6* 1.9* 1.3* 3.2*  NEUTROABS 1.0* 0.8* 1.1* 1.4* 0.9*  --   HGB 8.8* 9.5* 9.1* 8.5* 8.5* 8.1*  HCT 25.1* 27.5* 26.7* 25.4* 25.6* 23.6*  MCV 91.6 93.2 92.4 95.5 94.1 94.8  PLT 174 188 176 188 193 097   Basic Metabolic Panel:  Recent Labs Lab 08/10/17 0314 08/11/17 0418 08/12/17 0320 08/13/17 0427 08/14/17 0737  NA 141 138 140 139 139  K 3.3* 3.8 3.6 3.7 4.3  CL 106 103 99* 97* 99*  CO2 28 29 34* 38* 33*  GLUCOSE 90 115* 187* 195* 296*  BUN <5* <5* <5* 6 9  CREATININE 0.45* 0.45* 0.39* 0.42* 0.44*  CALCIUM 8.3* 8.6* 8.7* 8.6* 8.8*  MG 1.9 1.8 1.6* 1.9 2.0  PHOS  --  3.1 3.1 3.5 1.9*   Liver Function Tests: No results for input(s): AST, ALT, ALKPHOS, BILITOT, PROT, ALBUMIN in the last 168 hours. Coagulation Profile:  Recent Labs Lab 08/07/17 2327 08/13/17 1610  INR 1.13 1.14   CBG:  Recent Labs Lab 08/10/17 0753 08/11/17 0826 08/12/17 0812 08/13/17 0746 08/14/17 0749  GLUCAP 127* 161* 181* 250* 296*   Sepsis Labs:  Recent Labs Lab 08/13/17 1610  PROCALCITON <0.10  LATICACIDVEN 0.9  0.5   Radiology Studies: Ct Angio Chest Pe W Or Wo Contrast  Result Date: 08/13/2017 CLINICAL DATA:  51 year old male with history of lung cancer. Ongoing chemotherapy and radiation therapy. Anterior for chest pain and cough since mid July. Evaluate for pulmonary embolism. EXAM: CT ANGIOGRAPHY CHEST WITH CONTRAST TECHNIQUE: Multidetector CT imaging of the chest was performed using the standard protocol during bolus administration of intravenous contrast. Multiplanar CT image reconstructions and MIPs were obtained to evaluate the vascular anatomy. CONTRAST:  100 mL of Isovue 370. COMPARISON:  Chest CTA 08/03/2017. FINDINGS: Cardiovascular: No filling defects within the pulmonary arterial tree to  suggest underlying pulmonary embolism. Heart size is normal. There is no significant pericardial fluid, thickening or pericardial calcification. Right internal jugular single-lumen porta cath with tip terminating in the right atrium. Aortic atherosclerosis, without evidence of aneurysm or dissection in the thoracic aorta. No definite coronary artery calcifications are identified. Mediastinum/Nodes: Previously noted mass in the left hilar region appears slightly larger than the prior examination measuring 5.3 x 6.4 cm on today's study (axial image 43 of series 4). Esophagus is unremarkable in appearance. No axillary lymphadenopathy. Lungs/Pleura: New patchy asymmetrically distributed areas of ground-glass attenuation, septal thickening and thickening of the peribronchovascular interstitium are seen scattered throughout the lungs bilaterally, most evident in the right upper lobe, right lower lobe and inferior aspect of the left lower lobe. More confluent consolidation is also noted in the posterior aspect of the right lower lobe. New small right pleural effusion lying dependently. Upper Abdomen: Unremarkable. Musculoskeletal: There are no aggressive appearing lytic or blastic  lesions noted in the visualized portions of the skeleton. Review of the MIP images confirms the above findings. IMPRESSION: 1. New patchy multifocal interstitial and airspace disease throughout the lungs bilaterally is asymmetrically distributed. These findings can be seen in the setting of developing multilobar bronchopneumonia, however, in the setting of recent radiation therapy, areas of postradiation pneumonitis are suspected, and correlation with patient's symptoms and the known the radiation portal should help discriminate between infection and postradiation changes. 2. New small right pleural effusion. 3. Previously noted mass centered in the left hilar region appears slightly larger than prior examination, as above. 4. Aortic  atherosclerosis. Aortic Atherosclerosis (ICD10-I70.0). Electronically Signed   By: Vinnie Langton M.D.   On: 08/13/2017 13:58   Scheduled Meds: . chlorhexidine  15 mL Mouth Rinse BID  . Chlorhexidine Gluconate Cloth  6 each Topical Daily  . clonazePAM  0.5 mg Per Tube BID  . dexamethasone  4 mg Intravenous Q24H  . enoxaparin (LOVENOX) injection  40 mg Subcutaneous QHS  . feeding supplement  1 Container Oral TID BM  . fentaNYL  75 mcg Transdermal Q72H  . fluticasone furoate-vilanterol  1 puff Inhalation Daily  . guaiFENesin  200 mg Per Tube Q8H  . LORazepam  0.5 mg Intravenous Q4H  . mouth rinse  15 mL Mouth Rinse q12n4p  . [START ON 08/15/2017] multivitamin  15 mL Per Tube Daily  . nicotine  21 mg Transdermal Daily  . polyethylene glycol  17 g Oral Daily  . ranitidine  150 mg Oral BID  . sennosides  5 mL Per Tube QHS  . traZODone  50 mg Per Tube QHS   Continuous Infusions: . sodium chloride 10 mL/hr at 08/13/17 2216  . feeding supplement (OSMOLITE 1.5 CAL) 1,000 mL (08/14/17 1006)  . piperacillin-tazobactam (ZOSYN)  IV 3.375 g (08/14/17 1459)     LOS: 7 days   Time spent: 5 minutes   Vance Gather, MD Pager [phone]   [facility] [email or web address] Password [email or web address] 08/14/2017, 7:00 PM

## 2017-08-14 NOTE — Progress Notes (Signed)
Care resumed for this patient from previous RN at 2230. Agree with previous assessment. However, patient having increased confusion, pulling at all lines and tubing. Patient able to be redirected but quickly begins fidgeting with tubes after RN leaves room. Mittens applied but pt also pulling those off. Wife at bedside. Scheduled Ativan given per previous RN. On-call Schor paged to be notified.

## 2017-08-14 NOTE — Progress Notes (Signed)
Date:  August 14, 2017 Chart reviewed for concurrent status and case management needs.  Will continue to follow patient progress.  Discharge Planning: following for needs  Expected discharge date: [id number]  Velva Harman, BSN, [place], [place]

## 2017-08-14 NOTE — Progress Notes (Signed)
Rec'd phone call from pt's mother Graesyn Schreifels at [phone]. Pt's mother asking for information re: pt's condition and test results.  Ms. Fallen states that she just saw on Facebook that her daughter in law had posted something saying that her son was now in ICU and had an infection and stated emphatically that she had a right to know what was going on.  Took down Ms. Monk's contact information [phone] and explained that once RN had a moment finish up in another pt's room, she would review her son's chart for verification that information can be shared with her and give Ms. Aldrete would be called back. Noted in chart that pt's wife Germany Chelf and pt's son Zakir Henner are the only individuals listed for contacts.  Spoke with pt's son Zyiere at the bedside and explained situation.  He said his dad would be upset to know that his step mother had shared his condition on Facebook. Pt's son states that he will either talk to his grandmother now if she calls him, or just call her in the morning first thing to talk about his dad's condition.  @ [phone] returned Ms. Kroeze's phone call and explained that her daughter in law and grandson were both listed as contacts in her son's chart however she was not, which limits what information can be shared about his condition due to HIPPA. Stated that pt was resting well and that her grandson would be touching base with her later on this morning.  Ms. Malveaux was disappointed but understanding of regulations re: HIPPA.

## 2017-08-14 NOTE — Progress Notes (Signed)
[facility]  Patient Status: Active (receiving services up to time of hospitalization)   We are currently following for Enteral supplies.  If patient discharges after hours, please call [phone].   Trevor Zavala 08/14/2017, 10:09 AM

## 2017-08-15 DIAGNOSIS — E860 Dehydration: Secondary | ICD-10-CM

## 2017-08-15 DIAGNOSIS — R638 Other symptoms and signs concerning food and fluid intake: Secondary | ICD-10-CM

## 2017-08-15 LAB — BASIC METABOLIC PANEL
ANION GAP: 10 (ref 5–15)
BUN: 10 mg/dL (ref 6–20)
CHLORIDE: 97 mmol/L — AB (ref 101–111)
CO2: 32 mmol/L (ref 22–32)
Calcium: 8.9 mg/dL (ref 8.9–10.3)
Creatinine, Ser: 0.53 mg/dL — ABNORMAL LOW (ref 0.61–1.24)
GFR calc non Af Amer: >60 mL/min (ref >60)
Glucose, Bld: 295 mg/dL — ABNORMAL HIGH (ref 65–99)
Potassium: 4 mmol/L (ref 3.5–5.1)
Sodium: 139 mmol/L (ref 135–145)

## 2017-08-15 LAB — CBC
HEMATOCRIT: 24.6 % — AB (ref 39.0–52.0)
HEMOGLOBIN: 8.4 g/dL — AB (ref 13.0–17.0)
MCH: 32.4 pg (ref 26.0–34.0)
MCHC: 34.1 g/dL (ref 30.0–36.0)
MCV: 95 fL (ref 78.0–100.0)
Platelets: 240 10*3/uL (ref 150–400)
RBC: 2.59 MIL/uL — ABNORMAL LOW (ref 4.22–5.81)
RDW: 14 % (ref 11.5–15.5)
WBC: 5.3 10*3/uL (ref 4.0–10.5)

## 2017-08-15 LAB — PHOSPHORUS: PHOSPHORUS: 2.9 mg/dL (ref 2.5–4.6)

## 2017-08-15 LAB — MAGNESIUM: Magnesium: 1.7 mg/dL (ref 1.7–2.4)

## 2017-08-15 MED ORDER — HALOPERIDOL LACTATE 2 MG/ML PO CONC
3.0000 mg | Freq: Every day | ORAL | Status: DC
  Administered 2017-08-15: 3 mg
  Filled 2017-08-15 (×3): qty 1.5

## 2017-08-15 MED ORDER — LORAZEPAM 2 MG/ML IJ SOLN
1.0000 mg | Freq: Once | INTRAMUSCULAR | Status: AC
  Administered 2017-08-15: 1 mg via INTRAVENOUS
  Filled 2017-08-15: qty 1

## 2017-08-15 MED ORDER — HALOPERIDOL LACTATE 5 MG/ML IJ SOLN
2.5000 mg | Freq: Once | INTRAMUSCULAR | Status: AC
  Administered 2017-08-15: 2.5 mg via INTRAVENOUS
  Filled 2017-08-15: qty 1

## 2017-08-15 MED ORDER — HALOPERIDOL LACTATE 5 MG/ML IJ SOLN: 2.5000 mg | Freq: Four times a day (QID) | INTRAMUSCULAR | Status: DC | PRN

## 2017-08-15 NOTE — Progress Notes (Signed)
Pt's HR sustaining from 110s up to 140s at times. Pt very anxious at times when attempting to remove his mittens, but follows commands and relaxes when staff present to room. HR also lowers when staff present to reorient patient. Pt aware he is in the hospital, but also talks about things he needs to do because he is "at work." Pt disoriented to time and situation. Scheduled Ativan given with no effect at this time. On-call made aware- EKG obtained showing ST.

## 2017-08-15 NOTE — Progress Notes (Signed)
PROGRESS NOTE  Trevor Zavala  XFG:[id number] DOB: Apr 29, 1966 DOA: 08/07/2017   PCP: Cassandria Anger, MD    Brief Narrative:  51 y.o. male with medical history significant of stage-III squamous cell lung cancer, squamous cell cancer of the epiglottis, currently on chemotherapy and radiation therapy, hypertension, COPD, GERD, depression, anxiety, tobacco abuse, who presents with dysphagia, generalized weakness and SOB. Patient states he can only eat little liquid food, and has significantly decreased appetite and oral intake recently. Patient was admitted for further pain management as well as PEG placement for poor nutrition. PEG was placed 10/1. Due to ongoing pain, palliative care was consulted for symptom management. He had acute respiratory decompensation 10/4 with CTA chest showing infiltrate vs. edema vs. radiation pneumonitis. Zosyn and steroids started with improvement.  Assessment & Plan: Dehydration, moderate protein-calorie malnutrition secondary to dysphagia result of epiglottis cancer, radiation therapy: Improved with PEG 10/1.  - Continue tube feeds and full liquid diet. - Pain control per palliative care, stable on fentanyl patch and prns.   Acute on chronic hypoxic respiratory failure: Due to possible pneumonia (CT showing possible infiltrate, though PCT is undetectable), radiation pneumonitis (not yet in the typical window for this to develop per Dr. Tammi Klippel), aspiration pneumonitis (at high risk), COPD exacerbation, and/or pulmonary edema in addition to cancer disease progression suggested by imaging. No PE.  - Continues improvement venturi > HFNC 8L > 4L.  - Continue empiric zosyn, decadron, breathing treatments, breo ellipta.  - Monitor cultures - Will follow daily weights, consider diuresis if not improving.  Stage III squamous cell carcinoma of left lung - Follows with Dr. Julien Nordmann who has seen the patient.  - Continue concurrent chemo/XRT.   Acute delirium: In  setting of critical illness. Doesn't fit exactly with steroids, but will taper these as soon as possible.  - Minimize benzodiazepines, prefer haldol. Will order higher prn doses of haldol and 1 standing dose at bedtime.  - Delirium precautions reviewed with wife at bedside: Keep lights on, and pull blinds during day, continue redirection as able, family to be present as much as is practical. - Soft restraints as absolutely necessary for patient/staff safety and to maintain PEG.   GERD - IV pepcid  Generalized anxiety disorder - Continue home klonopin to avoid withdrawal.  HTN - hydralazine as needed IV  Leukopenia: Improving s/p neupogen  - Monitor  Hypomagnesemia: wnl following replacement.   Tobacco abuse - Nicotine patch, cessation counseling   Constipation - Bowel regimen   DVT prophylaxis: lovenox Code Status: Full Family Communication: Wife updated pt at bedside  Disposition Plan: Continue SDU level of care.  Consultants:   IR  Palliative care   Oncology   Procedures:   None  Antimicrobials:  Zosyn  Subjective: Shortness of breath improving, oxygen requirement down over past 24 hours. Began having worsening delirium overnight requiring restraints. Denies pain this morning, finally getting some sleep this morning.   Objective: Vitals:   08/15/17 0400 08/15/17 0500 08/15/17 0900 08/15/17 1000  BP: (!) 130/56  103/61 130/73  Pulse: (!) 102  63 61  Resp: (!) 24  14 15   Temp:      TempSrc:      SpO2: 96%  93% 92%  Weight:  73.8 kg (162 lb 11.2 oz)    Height:        Intake/Output Summary (Last 24 hours) at 08/15/17 1101 Last data filed at 08/15/17 0700  Gross per 24 hour  Intake  1410.67 ml  Output              750 ml  Net           660.67 ml   Filed Weights   08/07/17 1801 08/08/17 0657 08/15/17 0500  Weight: 68 kg (150 lb) 69.7 kg (153 lb 9.6 oz) 73.8 kg (162 lb 11.2 oz)    Physical Exam  Constitutional: Chronically ill-appearing  tired 51yo male in no distress with nasal cannula on face, not in nose. CVS: RRR, no murmurs, no gallops, no carotid bruit. No JVD or LE edema noted. Pulmonary: Diffuse expiratory rhonchi without wheezes or crackles. No accessory muscle use. Abdominal: Soft. BS +,  no distension, PEG in place without discharge.  Neuro: Alert, oriented, speech fluent. No focal sensorimotor deficits noted.  Data Reviewed: I have personally reviewed following data:  CBC:  Recent Labs Lab 08/09/17 1030 08/10/17 0314 08/11/17 0418 08/12/17 0320 08/13/17 0427 08/14/17 0737 08/15/17 0415  WBC 1.3* 1.2* 1.6* 1.9* 1.3* 3.2* 5.3  NEUTROABS 1.0* 0.8* 1.1* 1.4* 0.9*  --   --   HGB 8.8* 9.5* 9.1* 8.5* 8.5* 8.1* 8.4*  HCT 25.1* 27.5* 26.7* 25.4* 25.6* 23.6* 24.6*  MCV 91.6 93.2 92.4 95.5 94.1 94.8 95.0  PLT 174 188 176 188 193 203 235   Basic Metabolic Panel:  Recent Labs Lab 08/11/17 0418 08/12/17 0320 08/13/17 0427 08/14/17 0737 08/15/17 0415  NA 138 140 139 139 139  K 3.8 3.6 3.7 4.3 4.0  CL 103 99* 97* 99* 97*  CO2 29 34* 38* 33* 32  GLUCOSE 115* 187* 195* 296* 295*  BUN <5* <5* 6 9 10   CREATININE 0.45* 0.39* 0.42* 0.44* 0.53*  CALCIUM 8.6* 8.7* 8.6* 8.8* 8.9  MG 1.8 1.6* 1.9 2.0 1.7  PHOS 3.1 3.1 3.5 1.9* 2.9   Liver Function Tests: No results for input(s): AST, ALT, ALKPHOS, BILITOT, PROT, ALBUMIN in the last 168 hours. Coagulation Profile:  Recent Labs Lab 08/13/17 1610  INR 1.14   CBG:  Recent Labs Lab 08/10/17 0753 08/11/17 0826 08/12/17 0812 08/13/17 0746 08/14/17 0749  GLUCAP 127* 161* 181* 250* 296*   Sepsis Labs:  Recent Labs Lab 08/13/17 1610  PROCALCITON <0.10  LATICACIDVEN 0.9  0.5   Radiology Studies: Ct Angio Chest Pe W Or Wo Contrast  Result Date: 08/13/2017 CLINICAL DATA:  51 year old male with history of lung cancer. Ongoing chemotherapy and radiation therapy. Anterior for chest pain and cough since mid July. Evaluate for pulmonary embolism. EXAM:  CT ANGIOGRAPHY CHEST WITH CONTRAST TECHNIQUE: Multidetector CT imaging of the chest was performed using the standard protocol during bolus administration of intravenous contrast. Multiplanar CT image reconstructions and MIPs were obtained to evaluate the vascular anatomy. CONTRAST:  100 mL of Isovue 370. COMPARISON:  Chest CTA 08/03/2017. FINDINGS: Cardiovascular: No filling defects within the pulmonary arterial tree to suggest underlying pulmonary embolism. Heart size is normal. There is no significant pericardial fluid, thickening or pericardial calcification. Right internal jugular single-lumen porta cath with tip terminating in the right atrium. Aortic atherosclerosis, without evidence of aneurysm or dissection in the thoracic aorta. No definite coronary artery calcifications are identified. Mediastinum/Nodes: Previously noted mass in the left hilar region appears slightly larger than the prior examination measuring 5.3 x 6.4 cm on today's study (axial image 43 of series 4). Esophagus is unremarkable in appearance. No axillary lymphadenopathy. Lungs/Pleura: New patchy asymmetrically distributed areas of ground-glass attenuation, septal thickening and thickening of the peribronchovascular interstitium are seen scattered  throughout the lungs bilaterally, most evident in the right upper lobe, right lower lobe and inferior aspect of the left lower lobe. More confluent consolidation is also noted in the posterior aspect of the right lower lobe. New small right pleural effusion lying dependently. Upper Abdomen: Unremarkable. Musculoskeletal: There are no aggressive appearing lytic or blastic lesions noted in the visualized portions of the skeleton. Review of the MIP images confirms the above findings. IMPRESSION: 1. New patchy multifocal interstitial and airspace disease throughout the lungs bilaterally is asymmetrically distributed. These findings can be seen in the setting of developing multilobar bronchopneumonia,  however, in the setting of recent radiation therapy, areas of postradiation pneumonitis are suspected, and correlation with patient's symptoms and the known the radiation portal should help discriminate between infection and postradiation changes. 2. New small right pleural effusion. 3. Previously noted mass centered in the left hilar region appears slightly larger than prior examination, as above. 4. Aortic atherosclerosis. Aortic Atherosclerosis (ICD10-I70.0). Electronically Signed   By: Vinnie Langton M.D.   On: 08/13/2017 13:58   Scheduled Meds: . chlorhexidine  15 mL Mouth Rinse BID  . Chlorhexidine Gluconate Cloth  6 each Topical Daily  . clonazePAM  0.5 mg Per Tube BID  . dexamethasone  4 mg Intravenous Q24H  . enoxaparin (LOVENOX) injection  40 mg Subcutaneous QHS  . feeding supplement  1 Container Oral TID BM  . fentaNYL  75 mcg Transdermal Q72H  . fluticasone furoate-vilanterol  1 puff Inhalation Daily  . guaiFENesin  200 mg Per Tube Q8H  . LORazepam  0.5 mg Intravenous Q4H  . mouth rinse  15 mL Mouth Rinse q12n4p  . multivitamin  15 mL Per Tube Daily  . nicotine  21 mg Transdermal Daily  . polyethylene glycol  17 g Oral Daily  . ranitidine  150 mg Oral BID  . sennosides  5 mL Per Tube QHS  . traZODone  50 mg Per Tube QHS   Continuous Infusions: . sodium chloride 10 mL/hr at 08/13/17 2216  . feeding supplement (OSMOLITE 1.5 CAL) 1,000 mL (08/15/17 0405)  . piperacillin-tazobactam (ZOSYN)  IV Stopped (08/15/17 0916)     LOS: 8 days   Time spent: 25 minutes   Vance Gather, MD Pager [phone]   [facility] [email or web address] Password [email or web address] 08/15/2017, 11:01 AM

## 2017-08-15 NOTE — Progress Notes (Signed)
Bilateral wrist restraints and soft waist belt applied to patient at 0400. NT present to assist. Several other methods were attempted before restraints applied but were unsuccessful including- IV Ativan, IV Haldol, telesitter, wife at bedside assisting with distraction and redirection, redirection many times by nursing staff, liquids given as well as patient toileted. Pt continues to pull at lines/tubes and attempt to get OOB at times. Pt is very confused and believes he needs to get up for work. Reasoning for importance of restraints explained in detail to patient and wife at bedside, both verbalized understanding. Estate agent notified. Will continue to monitor.

## 2017-08-16 ENCOUNTER — Inpatient Hospital Stay (HOSPITAL_COMMUNITY): Payer: Managed Care, Other (non HMO)

## 2017-08-16 DIAGNOSIS — J9601 Acute respiratory failure with hypoxia: Secondary | ICD-10-CM

## 2017-08-16 LAB — BASIC METABOLIC PANEL
ANION GAP: 6 (ref 5–15)
BUN: 15 mg/dL (ref 6–20)
CALCIUM: 9.1 mg/dL (ref 8.9–10.3)
CO2: 35 mmol/L — AB (ref 22–32)
CREATININE: 0.49 mg/dL — AB (ref 0.61–1.24)
Chloride: 98 mmol/L — ABNORMAL LOW (ref 101–111)
Glucose, Bld: 428 mg/dL — ABNORMAL HIGH (ref 65–99)
Potassium: 4.3 mmol/L (ref 3.5–5.1)
SODIUM: 139 mmol/L (ref 135–145)

## 2017-08-16 LAB — GLUCOSE, CAPILLARY
GLUCOSE-CAPILLARY: 186 mg/dL — AB (ref 65–99)
Glucose-Capillary: 248 mg/dL — ABNORMAL HIGH (ref 65–99)
Glucose-Capillary: 264 mg/dL — ABNORMAL HIGH (ref 65–99)
Glucose-Capillary: 276 mg/dL — ABNORMAL HIGH (ref 65–99)
Glucose-Capillary: 321 mg/dL — ABNORMAL HIGH (ref 65–99)
Glucose-Capillary: 430 mg/dL — ABNORMAL HIGH (ref 65–99)

## 2017-08-16 LAB — PHOSPHORUS: PHOSPHORUS: 3.5 mg/dL (ref 2.5–4.6)

## 2017-08-16 MED ORDER — DEXAMETHASONE SODIUM PHOSPHATE 4 MG/ML IJ SOLN
4.0000 mg | INTRAMUSCULAR | Status: DC
  Administered 2017-08-16: 4 mg via INTRAVENOUS
  Filled 2017-08-16: qty 1

## 2017-08-16 MED ORDER — INSULIN ASPART 100 UNIT/ML ~~LOC~~ SOLN
0.0000 [IU] | SUBCUTANEOUS | Status: DC
  Administered 2017-08-16 (×2): 11 [IU] via SUBCUTANEOUS
  Administered 2017-08-16: 15 [IU] via SUBCUTANEOUS
  Administered 2017-08-16: 3 [IU] via SUBCUTANEOUS
  Administered 2017-08-16: 8 [IU] via SUBCUTANEOUS
  Administered 2017-08-17: 5 [IU] via SUBCUTANEOUS
  Administered 2017-08-17: 15 [IU] via SUBCUTANEOUS
  Administered 2017-08-17: 5 [IU] via SUBCUTANEOUS

## 2017-08-16 NOTE — Progress Notes (Signed)
PROGRESS NOTE  Trevor Zavala  FTD:[id number] DOB: 1966-10-28 DOA: 08/07/2017   PCP: Cassandria Anger, MD    Brief Narrative:  51 y.o. male with medical history significant of stage-III squamous cell lung cancer, squamous cell cancer of the epiglottis, currently on chemotherapy and radiation therapy, hypertension, COPD, GERD, depression, anxiety, tobacco abuse, who presents with dysphagia, generalized weakness and SOB. Patient states he can only eat little liquid food, and has significantly decreased appetite and oral intake recently. Patient was admitted for further pain management as well as PEG placement for poor nutrition. PEG was placed 10/1. Due to ongoing pain, palliative care was consulted for symptom management. He had acute respiratory decompensation 10/4 with CTA chest showing infiltrate vs. edema vs. radiation pneumonitis. Zosyn and steroids started with improvement.  Assessment & Plan: Dehydration, moderate protein-calorie malnutrition secondary to dysphagia result of epiglottis cancer, radiation therapy: Improved with PEG 10/1.  - Continue tube feeds and full liquid diet. - Pain control per palliative care, stable on fentanyl patch and prns.   Acute on chronic hypoxic respiratory failure: Due to possible pneumonia (CT showing possible infiltrate, though PCT is undetectable), radiation pneumonitis (not yet in the typical window for this to develop per Dr. Tammi Klippel), aspiration pneumonitis (at high risk), COPD exacerbation, and/or pulmonary edema in addition to cancer disease progression suggested by imaging. No PE.   - Improved with progression venturi > HFNC 8L > 4L. His baseline is without oxygen, so will attempt to wean to room air. - Will check CXR, possibly diurese. May ask for pulm assistance.  - Continue empiric zosyn, decadron, breathing treatments, breo ellipta.  - Monitor cultures - Will follow daily weights, consider diuresis if not improving.  Stage III squamous cell  carcinoma of left lung - Follows with Dr. Julien Nordmann - Continue concurrent chemo/XRT.   Acute delirium: In setting of critical illness. Doesn't fit exactly with steroids, but will stop these after 5 days. - Minimize benzodiazepines, prefer haldol. Continue prn haldol and 1 standing dose at bedtime while hospitalized.  - Delirium precautions reviewed at bedside: Keep lights on, and pull blinds during day, continue redirection as able, family to be present as much as is practical.  - Soft restraints only as absolutely necessary for patient/staff safety and to maintain PEG.   GERD - Ranitidine BID  Generalized anxiety disorder - Continue home klonopin to avoid withdrawal.  HTN - Hydralazine as needed IV  Leukopenia: Improving s/p neupogen  - Monitor  Hypomagnesemia: wnl following replacement.   Tobacco abuse - Nicotine patch, cessation counseling   Steroid-induced hyperglycemia: No gap or acidosis.  - Check HbA1c - Start mod SSI q4h and titrate as necessary.  - Last steroid dose scheduled 10/8.  Constipation - Bowel regimen   DVT prophylaxis: lovenox Code Status: Full Family Communication: Son at bedside Disposition Plan: Transfer to floor. Possibly DC home in 24-48 hours.  Consultants:   IR  Palliative care   Oncology   Procedures:   None  Antimicrobials:  Zosyn 10/4 >>   Subjective: Shortness of breath improving, no chest pain or wheezing. Delirium has resolved. Slept better last night.   Objective: Vitals:   08/16/17 0400 08/16/17 0500 08/16/17 0600 08/16/17 0800  BP: 119/74  114/69   Pulse: 60  65   Resp: 16  13   Temp:    98.5 F (36.9 C)  TempSrc:    Oral  SpO2: 100%  94%   Weight:  73.1 kg (161 lb 2.5 oz)  Height:        Intake/Output Summary (Last 24 hours) at 08/16/17 0839 Last data filed at 08/16/17 0530  Gross per 24 hour  Intake          2112.83 ml  Output             1350 ml  Net           762.83 ml   Filed Weights   08/08/17  0657 08/15/17 0500 08/16/17 0500  Weight: 69.7 kg (153 lb 9.6 oz) 73.8 kg (162 lb 11.2 oz) 73.1 kg (161 lb 2.5 oz)    Physical Exam  Constitutional: Chronically ill-appearing, alert 51yo male in no distress CVS: RRR, no murmurs, no gallops, no carotid bruit. No JVD or LE edema noted. Pulmonary: Rhonchi have largely resolved. No wheezing or crackles. Nonlabored, 99% on 4L by [place]. Abdominal: Soft. BS +,  no distension, PEG in place without discharge.  Neuro: Alert, oriented, speech fluent. No focal sensorimotor deficits noted.  Data Reviewed: I have personally reviewed following data:  CBC:  Recent Labs Lab 08/09/17 1030 08/10/17 0314 08/11/17 0418 08/12/17 0320 08/13/17 0427 08/14/17 0737 08/15/17 0415  WBC 1.3* 1.2* 1.6* 1.9* 1.3* 3.2* 5.3  NEUTROABS 1.0* 0.8* 1.1* 1.4* 0.9*  --   --   HGB 8.8* 9.5* 9.1* 8.5* 8.5* 8.1* 8.4*  HCT 25.1* 27.5* 26.7* 25.4* 25.6* 23.6* 24.6*  MCV 91.6 93.2 92.4 95.5 94.1 94.8 95.0  PLT 174 188 176 188 193 203 998   Basic Metabolic Panel:  Recent Labs Lab 08/11/17 0418 08/12/17 0320 08/13/17 0427 08/14/17 0737 08/15/17 0415 08/16/17 0527  NA 138 140 139 139 139 139  K 3.8 3.6 3.7 4.3 4.0 4.3  CL 103 99* 97* 99* 97* 98*  CO2 29 34* 38* 33* 32 35*  GLUCOSE 115* 187* 195* 296* 295* 428*  BUN <5* <5* 6 9 10 15   CREATININE 0.45* 0.39* 0.42* 0.44* 0.53* 0.49*  CALCIUM 8.6* 8.7* 8.6* 8.8* 8.9 9.1  MG 1.8 1.6* 1.9 2.0 1.7  --   PHOS 3.1 3.1 3.5 1.9* 2.9 3.5   Liver Function Tests: No results for input(s): AST, ALT, ALKPHOS, BILITOT, PROT, ALBUMIN in the last 168 hours. Coagulation Profile:  Recent Labs Lab 08/13/17 1610  INR 1.14   CBG:  Recent Labs Lab 08/11/17 0826 08/12/17 0812 08/13/17 0746 08/14/17 0749 08/16/17 0743  GLUCAP 161* 181* 250* 296* 430*   Sepsis Labs:  Recent Labs Lab 08/13/17 1610  PROCALCITON <0.10  LATICACIDVEN 0.9  0.5   Radiology Studies: No results found. Scheduled Meds: . chlorhexidine  15  mL Mouth Rinse BID  . Chlorhexidine Gluconate Cloth  6 each Topical Daily  . clonazePAM  0.5 mg Per Tube BID  . dexamethasone  4 mg Intravenous Q24H  . enoxaparin (LOVENOX) injection  40 mg Subcutaneous QHS  . feeding supplement  1 Container Oral TID BM  . fentaNYL  75 mcg Transdermal Q72H  . fluticasone furoate-vilanterol  1 puff Inhalation Daily  . guaiFENesin  200 mg Per Tube Q8H  . haloperidol  3 mg Per Tube QHS  . insulin aspart  0-15 Units Subcutaneous Q4H  . mouth rinse  15 mL Mouth Rinse q12n4p  . multivitamin  15 mL Per Tube Daily  . nicotine  21 mg Transdermal Daily  . polyethylene glycol  17 g Oral Daily  . ranitidine  150 mg Oral BID  . sennosides  5 mL Per Tube QHS  . traZODone  50 mg Per Tube QHS   Continuous Infusions: . sodium chloride 10 mL/hr at 08/16/17 0400  . feeding supplement (OSMOLITE 1.5 CAL) 1,000 mL (08/16/17 0400)  . piperacillin-tazobactam (ZOSYN)  IV 3.375 g (08/16/17 0530)     LOS: 9 days   Time spent: 25 minutes   Vance Gather, MD Pager [phone]   [facility] [email or web address] Password [email or web address] 08/16/2017, 8:39 AM

## 2017-08-16 NOTE — Progress Notes (Signed)
Patient blood sugar 430. Dr. Bonner Puna already aware new orders received will reassess post medication administration.

## 2017-08-17 ENCOUNTER — Other Ambulatory Visit: Payer: Managed Care, Other (non HMO)

## 2017-08-17 ENCOUNTER — Encounter: Payer: Self-pay | Admitting: *Deleted

## 2017-08-17 ENCOUNTER — Ambulatory Visit: Payer: Managed Care, Other (non HMO) | Admitting: Internal Medicine

## 2017-08-17 ENCOUNTER — Ambulatory Visit
Admission: RE | Admit: 2017-08-17 | Discharge: 2017-08-17 | Disposition: A | Discharge status: Home / Self Care | Payer: Managed Care, Other (non HMO) | Source: Ambulatory Visit | Attending: Radiation Oncology | Admitting: Radiation Oncology

## 2017-08-17 DIAGNOSIS — R221 Localized swelling, mass and lump, neck: Secondary | ICD-10-CM

## 2017-08-17 LAB — CBC
HEMATOCRIT: 31.6 % — AB (ref 39.0–52.0)
HEMOGLOBIN: 10.5 g/dL — AB (ref 13.0–17.0)
MCH: 31.9 pg (ref 26.0–34.0)
MCHC: 33.2 g/dL (ref 30.0–36.0)
MCV: 96 fL (ref 78.0–100.0)
Platelets: 320 10*3/uL (ref 150–400)
RBC: 3.29 MIL/uL — ABNORMAL LOW (ref 4.22–5.81)
RDW: 14.1 % (ref 11.5–15.5)
WBC: 5.2 10*3/uL (ref 4.0–10.5)

## 2017-08-17 LAB — BASIC METABOLIC PANEL
ANION GAP: 12 (ref 5–15)
BUN: 16 mg/dL (ref 6–20)
CO2: 33 mmol/L — AB (ref 22–32)
Calcium: 9.3 mg/dL (ref 8.9–10.3)
Chloride: 95 mmol/L — ABNORMAL LOW (ref 101–111)
Creatinine, Ser: 0.65 mg/dL (ref 0.61–1.24)
GFR calc Af Amer: >60 mL/min (ref >60)
GLUCOSE: 286 mg/dL — AB (ref 65–99)
POTASSIUM: 4 mmol/L (ref 3.5–5.1)
Sodium: 140 mmol/L (ref 135–145)

## 2017-08-17 LAB — HEMOGLOBIN A1C
Hgb A1c MFr Bld: 6.4 % — ABNORMAL HIGH (ref 4.8–5.6)
MEAN PLASMA GLUCOSE: 136.98 mg/dL

## 2017-08-17 LAB — GLUCOSE, CAPILLARY
GLUCOSE-CAPILLARY: 195 mg/dL — AB (ref 65–99)
Glucose-Capillary: 237 mg/dL — ABNORMAL HIGH (ref 65–99)
Glucose-Capillary: 353 mg/dL — ABNORMAL HIGH (ref 65–99)

## 2017-08-17 LAB — PHOSPHORUS: Phosphorus: 4.7 mg/dL — ABNORMAL HIGH (ref 2.5–4.6)

## 2017-08-17 MED ORDER — HEPARIN SOD (PORK) LOCK FLUSH 100 UNIT/ML IV SOLN
500.0000 [IU] | Freq: Once | INTRAVENOUS | Status: DC
  Filled 2017-08-17: qty 5

## 2017-08-17 MED ORDER — SENNOSIDES 8.8 MG/5ML PO SYRP: 5.0000 mL | ORAL_SOLUTION | Freq: Every day | ORAL | 0 refills | Status: DC

## 2017-08-17 MED ORDER — FENTANYL 75 MCG/HR TD PT72: 75.0000 ug | MEDICATED_PATCH | TRANSDERMAL | 0 refills | Status: DC

## 2017-08-17 MED ORDER — HYDROMORPHONE HCL 4 MG PO TABS
4.0000 mg | ORAL_TABLET | Freq: Four times a day (QID) | ORAL | 0 refills | Status: DC | PRN
Start: 2017-08-17 — End: 2017-10-29

## 2017-08-17 MED FILL — fentaNYL 75 MCG/HR PT72: 75 | 30 days supply | Qty: 10 | Fill #0

## 2017-08-17 MED FILL — HYDROmorphone HCL 4 MG TABS: 4 | 4 days supply | Qty: 15 | Fill #0

## 2017-08-17 NOTE — Progress Notes (Signed)
Pt to dc home on continuous TF and [facility] for supplies and RN. Marney Doctor RN,BSN,NCM [phone]

## 2017-08-17 NOTE — Progress Notes (Signed)
Discharge instructions given,patient verbalized understanding, portacath flushed and deaccessed, PEG tube flushed and clamped. All feeding supplies will be delivered at home.

## 2017-08-17 NOTE — Progress Notes (Signed)
Oncology Nurse Navigator Documentation  Oncology Nurse Navigator Flowsheets 08/17/2017  Navigator Location [facility]  Navigator Encounter Type Other/I updated Dr. Julien Nordmann that patient is being discharged today.  He gave me a vo for Ct chest and neck and a follow up in 3 weeks. VO completed and I notified scheduling to schedule appts  Patient Visit Type Inpatient  Treatment Phase Treatment  Barriers/Navigation Needs Coordination of Care  Interventions Coordination of Care  Coordination of Care Other  Acuity Level 2  Time Spent with Patient 30

## 2017-08-17 NOTE — Discharge Summary (Signed)
Physician Discharge Summary  VON QUINTANAR YKD:[id number] DOB: Apr 05, 1966 DOA: 08/07/2017  PCP: Cassandria Anger, MD  Admit date: 08/07/2017 Discharge date: 08/17/2017  Admitted From: Home Disposition: Home   Recommendations for Outpatient Follow-up:  1. Follow up with PCP in 1-2 weeks. 2. Continue tube feeding. 3. Continue chemotherapy and XRT per oncology and rad-onc.  4. Please obtain BMP/CBC in one week  Home Health: None Equipment/Devices: Tube feeding Discharge Condition: Stable CODE STATUS: Full Diet recommendation: Soft  Brief/Interim Summary: 51 y.o.malewith medical history significant ofstage-IIIsquamous cell lung cancer, squamous cell cancer of the epiglottis, currently on chemotherapy and radiation therapy, hypertension, COPD, GERD, depression, anxiety, tobacco abuse, who presents with dysphagia, generalized weakness and SOB. Patient states he can only eat little liquid food, and has significantly decreased appetite and oral intake recently. Patient was admitted for further pain management as well as PEG placement for poor nutrition. PEG was placed 10/1. Due to ongoing pain, palliative care was consulted for symptom management. He had acute respiratory decompensation 10/4 with CTA chest showing infiltrate vs. edema vs. pneumonitis. Zosyn and steroids started with improvement. 5 days were completed with significant improvement, returning to baseline.   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  - Continue tube feeds and full liquid diet. - Pain control per palliative care, stable on fentanyl patch 61mcg (up from 27mcg PTA) and dilaudid 4mg  q2h prn per tube (usually only taking qAM and qPM).   Acute on chronic hypoxic respiratory failure: Suspect due to aspiration event leading to pneumonitis and then COPD exacerbation. Possible pneumonia (CT showing possible infiltrate, though PCT is undetectable), radiation pneumonitis (not yet in the typical window for this to develop per Dr. Tammi Klippel), and/or pulmonary edema in addition to cancer disease progression suggested by imaging. No PE.   - Improved with progression venturi > HFNC 8L > 4L > room air with ambulation. - s/p 5 days steroids, abx. Negative cultures.   Stage III squamous cell carcinoma of left lung - Follows with Dr. Julien Nordmann - Continue concurrent chemo/XRT.   Acute delirium: One episode overnight in setting of critical illness. Resolved. - If develops at future hospitalizations, would avoid benzodiazepines and prefer haldol prn + HS standing dose.   GERD - Ranitidine BID  Generalized anxiety disorder - Continue home klonopin   HTN - Stable off of ARB. Will not restart at discharge pending PCP follow up.   Leukopenia: Improving s/p neupogen  - Monitor  Hypomagnesemia: wnl following replacement.   Tobacco abuse - Nicotine patch, cessation counseling   Steroid-induced hyperglycemia: No gap or acidosis. HbA1c 6.4%.  - Discussed limiting carbohydrates. No outpatient medications required since off steroids. - Last steroid dose scheduled 10/8.  Constipation - Bowel regimen   Discharge Instructions Discharge Instructions    Discharge instructions    Complete by:  As directed    You were admitted for feeding tube placement and developed respiratory distress. You've improved and  are  stable for discharge with the following recommendations:  - Exchange fentanyl patch tomorrow (dose increased to 59mcg every 3 days).  - You're also being prescribed dilaudid for as needed doses. You should no longer take morphine or oxycodone. - To prevent constipation you need to take senna (sent to your pharmacy) - If your symptoms return, seek medical attention right away.  - Follow up with radiation oncology and Dr. Julien Nordmann.   Increase activity slowly    Complete by:  As directed      Allergies as of 08/17/2017   No Known Allergies     Medication List    STOP taking these medications   acyclovir 400 MG tablet Commonly known as:  ZOVIRAX   aspirin 81 MG chewable tablet Commonly known as:  ASPIRIN CHILDRENS   fentaNYL 25 MCG/HR patch Commonly known as:  DURAGESIC - dosed mcg/hr Replaced by:  fentaNYL 75 MCG/HR   Morphine Sulfate ER 15 MG Tbea   mucosal barrier oral Gel   oxyCODONE 5 MG immediate release tablet Commonly known as:  Oxy IR/ROXICODONE   sucralfate 1 g tablet Commonly known as:  CARAFATE   valsartan 160 MG tablet Commonly known as:  DIOVAN   vitamin e 15 UNIT/0.3ML Soln solution Commonly known as:  AQUEOUS VITAMIN E     TAKE these medications   BREO ELLIPTA 100-25 MCG/INH Aepb Generic drug:  fluticasone furoate-vilanterol INHALE 1 PUFF INTO THE LUNGS DAILY   clonazePAM 0.5 MG tablet Commonly known as:  KLONOPIN Take 1 tablet (0.5 mg total) by mouth 2 (two) times daily as needed for anxiety.   emollient cream Commonly known as:  BIAFINE Apply topically as needed.   esomeprazole 40 MG capsule Commonly known as:  NEXIUM TAKE 1 CAPSULE BY MOUTH DAILY WITH BREAKFAST   fentaNYL 75 MCG/HR Commonly known as:  [place] - dosed mcg/hr Place 1 patch (75 mcg total) onto the skin every 3 (three) days. Replaces:  fentaNYL 25 MCG/HR patch   HYDROmorphone 4 MG tablet Commonly known as:  DILAUDID Place 1 tablet (4 mg total) into feeding tube every 6  (six) hours as needed for severe pain.   lidocaine 2 % solution Commonly known as:  XYLOCAINE 5 ml po q 8 hours as needed for mouth and throat pain   lidocaine-prilocaine cream Commonly known as:  EMLA Apply 1 application topically as needed.   loratadine 10 MG tablet Commonly known as:  CLARITIN Take 10 mg by mouth daily as needed for allergies.   nicotine 21 mg/24hr patch Commonly known as:  NICODERM CQ Place 1 patch (21 mg total) onto the skin daily.   prochlorperazine 10 MG tablet Commonly known as:  COMPAZINE Take 1 tablet (10 mg total) by mouth every 6 (six) hours as needed for nausea or vomiting.   sennosides 8.8 MG/5ML syrup Commonly known as:  SENOKOT Place 5 mLs into feeding tube at bedtime.   VITAMIN D PO Take 1 tablet by mouth daily.            Durable Medical Equipment        Start     Ordered   08/17/17 1046  For home use only DME Tube feeding  Once     08/17/17 1045   08/17/17 1046  For home use only DME Tube feeding pump  Once     08/17/17 1045     Follow-up Information    [name], [name], MD Follow up.   Specialty:  Internal Medicine  Contact information: [place] [address] [phone]        Tyler Pita, MD .   Specialty:  Radiation Oncology Contact information: [place] [address] [phone]        Curt Bears, MD .   Specialty:  Oncology Contact information: [place] [address] [phone]          No Known Allergies  Consultations:  IR  Palliative care   Oncology   Procedures/Studies: Dg Chest 2 View  Result Date: 08/07/2017 CLINICAL DATA:  Productive cough. Shortness of breath with exertion. Currently undergoing chemotherapy and radiation therapy for throat and lung cancer. Ex-smoker. EXAM: CHEST  2 VIEW COMPARISON:  07/01/2017. FINDINGS: Normal with sized heart. Clear lungs. Right jugular porta catheter tip at the  junction of the superior vena cava and right atrium. Thoracic spine degenerative changes. IMPRESSION: No acute abnormality. Electronically Signed   By: Claudie Revering M.D.   On: 08/07/2017 18:30   Ct Soft Tissue Neck W Contrast  Result Date: 08/07/2017 CLINICAL DATA:  Shortness of breath and neck swelling after radiation. History of lung cancer. EXAM: CT NECK WITH CONTRAST TECHNIQUE: Multidetector CT imaging of the neck was performed using the standard protocol following the bolus administration of intravenous contrast. CONTRAST:  100 cc Isovue 370 COMPARISON:  PET- CT June 10, 2017 FINDINGS: PHARYNX AND LARYNX: Mild late data/infiltrated paraglottic fat with thickened edematous epiglottis. Patent airway. Normal larynx. SALIVARY GLANDS: Normal. THYROID: Normal. LYMPH NODES: 13 mm LEFT supraclavicular lymph node was 16 mm. VASCULAR: Normal. LIMITED INTRACRANIAL: Normal. VISUALIZED ORBITS: Normal. MASTOIDS AND VISUALIZED PARANASAL SINUSES: Small LEFT maxillary sinus mucosal retention cyst. SKELETON: Nonacute.  RIGHT chest Port-A-Cath. UPPER CHEST: Please see CT chest from same day, reported separately for dedicated findings. OTHER: None. IMPRESSION: 1. Mild post radiation supraglottic edema, patent airway. 2. Smaller pathologic LEFT supraclavicular lymph node. Electronically Signed   By: Elon Alas M.D.   On: 08/07/2017 21:52   Ct Angio Chest Pe W Or Wo Contrast  Result Date: 08/13/2017 CLINICAL DATA:  51 year old male with history of lung cancer. Ongoing chemotherapy and radiation therapy. Anterior for chest pain and cough since mid July. Evaluate for pulmonary embolism. EXAM: CT ANGIOGRAPHY CHEST WITH CONTRAST TECHNIQUE: Multidetector CT imaging of the chest was performed using the standard protocol during bolus administration of intravenous contrast. Multiplanar CT image reconstructions and MIPs were obtained to evaluate the vascular anatomy. CONTRAST:  100 mL of Isovue 370. COMPARISON:  Chest CTA  08/03/2017. FINDINGS: Cardiovascular: No filling defects within the pulmonary arterial tree to suggest underlying pulmonary embolism. Heart size is normal. There is no significant pericardial fluid, thickening or pericardial calcification. Right internal jugular single-lumen porta cath with tip terminating in the right atrium. Aortic atherosclerosis, without evidence of aneurysm or dissection in the thoracic aorta. No definite coronary artery calcifications are identified. Mediastinum/Nodes: Previously noted mass in the left hilar region appears slightly larger than the prior examination measuring 5.3 x 6.4 cm on today's study (axial image 43 of series 4). Esophagus is unremarkable in appearance. No axillary lymphadenopathy. Lungs/Pleura: New patchy asymmetrically distributed areas of ground-glass attenuation, septal thickening and thickening of the peribronchovascular interstitium are seen scattered throughout the lungs bilaterally, most evident in the right upper lobe, right lower lobe and inferior aspect of the left lower lobe. More confluent consolidation is also noted in the posterior aspect of the right lower lobe. New small right pleural effusion lying dependently. Upper Abdomen:  Unremarkable. Musculoskeletal: There are no aggressive appearing lytic or blastic lesions noted in the visualized portions of the skeleton. Review of the MIP images confirms the above findings. IMPRESSION: 1. New patchy multifocal interstitial and airspace disease throughout the lungs bilaterally is asymmetrically distributed. These findings can be seen in the setting of developing multilobar bronchopneumonia, however, in the setting of recent radiation therapy, areas of postradiation pneumonitis are suspected, and correlation with patient's symptoms and the known the radiation portal should help discriminate between infection and postradiation changes. 2. New small right pleural effusion. 3. Previously noted mass centered in the  left hilar region appears slightly larger than prior examination, as above. 4. Aortic atherosclerosis. Aortic Atherosclerosis (ICD10-I70.0). Electronically Signed   By: Vinnie Langton M.D.   On: 08/13/2017 13:58   Ct Angio Chest Pe W And/or Wo Contrast  Result Date: 08/07/2017 CLINICAL DATA:  PE suspected, high pretest prob. Shortness of breath and neck swelling. Postradiation, stage III squamous cell carcinoma of left lung. EXAM: CT ANGIOGRAPHY CHEST WITH CONTRAST TECHNIQUE: Multidetector CT imaging of the chest was performed using the standard protocol during bolus administration of intravenous contrast. Multiplanar CT image reconstructions and MIPs were obtained to evaluate the vascular anatomy. CONTRAST:  100 cc Isovue 370 IV COMPARISON:  Chest radiograph earlier this day. PET-CT 06/10/2017. Chest CT PE protocol 06/06/2017 FINDINGS: Cardiovascular: Stable encasement of the left pulmonary artery at the left hilum without filling defects to suggest pulmonary embolus. Small caliber left upper lobe pulmonary vessels secondary to encasement. No filling defects throughout the right pulmonary arteries to suggest pulmonary embolus. The heart is normal in size. Normal caliber thoracic aorta with mild atherosclerosis. Right chest port in place. No evidence of IVC thrombus. No pericardial effusion. Mediastinum/Nodes: Left hilar mass (measurement includes the left pulmonary artery) measures 5.4 x 5.3 cm, slight decrease from prior PET using same measurement technique. This encases the left pulmonary artery and left upper lobe bronchus. Left supraclavicular node measures 12 mm previously 16 mm. Decreased size of left paratracheal node from prior PET. No new mediastinal or hilar adenopathy. The hypermetabolic right infrahilar node on prior PET is normal in size. Lungs/Pleura: Mild emphysema. The previous 12 mm spiculated right upper lobe nodule has resolved with faint scarring and cystic focus image 32 series 12.  Improving postobstructive pneumonitis in the lingula and left upper lobe. 5 mm faint left upper lobe nodule image 23 series 12, and decreased from prior exam. No new pulmonary nodule or focal airspace disease. There is bubbly debris within the left mainstem bronchi and right bronchus intermedius. No pleural fluid. Upper Abdomen: No acute finding.  No adrenal nodule. Musculoskeletal: There are no acute or suspicious osseous abnormalities. No focal bone lesion. Review of the MIP images confirms the above findings. IMPRESSION: 1. No pulmonary embolus. 2. Left hilar mass has decreased in size, persistent encasement of the left pulmonary artery. Decreased postobstructive pneumonitis in the left upper lobe. 3. Previous spiculated right upper lobe 12 mm nodule has resolved with minimal residual scarring/ cystic focus. 4. Left supraclavicular node has decreased in size. 5. Bubbly debris within the left mainstem bronchus and right bronchus intermedius suggesting mucus. Aortic Atherosclerosis (ICD10-I70.0) and Emphysema (ICD10-J43.9). Electronically Signed   By: Jeb Levering M.D.   On: 08/07/2017 21:58   Ir Gastrostomy Tube Mod Sed  Result Date: 08/10/2017 INDICATION: Head and neck cancer, receiving chemotherapy and radiation, dysphagia EXAM: TWENTY FRENCH PULL-THROUGH GASTROSTOMY Date:  10/1/201810/11/2016 9:21 am Radiologist:  M. Daryll Brod, MD Guidance:  Fluoroscopic MEDICATIONS: Ancef 2 g; Antibiotics were administered within 1 hour of the procedure. Glucagon 0.5 mg IV ANESTHESIA/SEDATION: Versed 2.0 mg IV; Fentanyl 1 high mcg IV Moderate Sedation Time:  18 minutes The patient was continuously monitored during the procedure by the interventional radiology nurse under my direct supervision. CONTRAST:  10 cc - administered into the gastric lumen. FLUOROSCOPY TIME:  Fluoroscopy Time: 1 minutes 42 seconds (32 mGy). COMPLICATIONS: None immediate. PROCEDURE: Informed consent was obtained from the patient following  explanation of the procedure, risks, benefits and alternatives. The patient understands, agrees and consents for the procedure. All questions were addressed. A time out was performed. Maximal barrier sterile technique utilized including caps, mask, sterile gowns, sterile gloves, large sterile drape, hand hygiene, and betadine prep. The left upper quadrant was sterilely prepped and draped. An oral gastric catheter was inserted into the stomach under fluoroscopy. The existing nasogastric feeding tube was removed. Air was injected into the stomach for insufflation and visualization under fluoroscopy. The air distended stomach was confirmed beneath the anterior abdominal wall in the frontal and lateral projections. Under sterile conditions and local anesthesia, a 25 gauge trocar needle was utilized to access the stomach percutaneously beneath the left subcostal margin. Needle position was confirmed within the stomach under biplane fluoroscopy. Contrast injection confirmed position also. A single T tack was deployed for gastropexy. Over an Amplatz guide wire, a 9-French sheath was inserted into the stomach. A snare device was utilized to capture the oral gastric catheter. The snare device was pulled retrograde from the stomach up the esophagus and out the oropharynx. The 20-French pull-through gastrostomy was connected to the snare device and pulled antegrade through the oropharynx down the esophagus into the stomach and then through the percutaneous tract external to the patient. The gastrostomy was assembled externally. Contrast injection confirms position in the stomach. Images were obtained for documentation. The patient tolerated procedure well. No immediate complication. IMPRESSION: Fluoroscopic insertion of a 20-French "pull-through" gastrostomy. Electronically Signed   By: Jerilynn Mages.  Shick M.D.   On: 08/10/2017 09:41   Dg Chest Port 1 View  Result Date: 08/16/2017 CLINICAL DATA:  Short of breath.  History of left  lung carcinoma. EXAM: PORTABLE CHEST 1 VIEW COMPARISON:  08/12/2017 FINDINGS: Cardiac silhouette is normal in size. No mediastinal or hilar masses. Mild lung base opacity, improved from the prior exam, likely atelectasis. Lungs are otherwise clear. No pneumothorax. Right anterior chest wall Port-A-Cath is stable and well positioned. IMPRESSION: 1. No acute cardiopulmonary disease. Electronically Signed   By: Lajean Manes M.D.   On: 08/16/2017 08:45   Dg Chest Port 1 View  Result Date: 08/12/2017 CLINICAL DATA:  Lung cancer.  Hypoxia. EXAM: PORTABLE CHEST 1 VIEW COMPARISON:  08/07/2017 FINDINGS: Stable right jugular Port-A-Cath. Normal heart size. Low volumes. Bibasilar atelectasis. No pneumothorax. IMPRESSION: Bibasilar atelectasis. Electronically Signed   By: Marybelle Killings M.D.   On: 08/12/2017 16:17   Subjective: Significant improvement, has ambulated around the ward without dyspnea on room air. No chest pain. Wants to go home.   Discharge Exam: Vitals:   08/17/17 0451 08/17/17 0816  BP: 114/65   Pulse: 60   Resp: 18   Temp: 98 F (36.7 C)   SpO2: 98% 99%   General: Pt is alert, awake, not in acute distress Cardiovascular: RRR, S1/S2 +, no rubs, no gallops Respiratory: Clear and nonlabored on room air Abdominal: Soft, NT, ND, bowel sounds +. PEG without erythema/discharge. Extremities: No edema, no cyanosis Skin: Post radiation  changes to anterior neck without open wound.  Labs: BNP (last 3 results) No results for input(s): BNP in the last 8760 hours. Basic Metabolic Panel:  Recent Labs Lab 08/11/17 0418 08/12/17 0320 08/13/17 0427 08/14/17 0737 08/15/17 0415 08/16/17 0527 08/17/17 0416  NA 138 140 139 139 139 139 140  K 3.8 3.6 3.7 4.3 4.0 4.3 4.0  CL 103 99* 97* 99* 97* 98* 95*  CO2 29 34* 38* 33* 32 35* 33*  GLUCOSE 115* 187* 195* 296* 295* 428* 286*  BUN <5* <5* 6 9 10 15 16   CREATININE 0.45* 0.39* 0.42* 0.44* 0.53* 0.49* 0.65  CALCIUM 8.6* 8.7* 8.6* 8.8* 8.9 9.1  9.3  MG 1.8 1.6* 1.9 2.0 1.7  --   --   PHOS 3.1 3.1 3.5 1.9* 2.9 3.5 4.7*   Liver Function Tests: No results for input(s): AST, ALT, ALKPHOS, BILITOT, PROT, ALBUMIN in the last 168 hours. No results for input(s): LIPASE, AMYLASE in the last 168 hours. No results for input(s): AMMONIA in the last 168 hours. CBC:  Recent Labs Lab 08/11/17 0418 08/12/17 0320 08/13/17 0427 08/14/17 0737 08/15/17 0415 08/17/17 0416  WBC 1.6* 1.9* 1.3* 3.2* 5.3 5.2  NEUTROABS 1.1* 1.4* 0.9*  --   --   --   HGB 9.1* 8.5* 8.5* 8.1* 8.4* 10.5*  HCT 26.7* 25.4* 25.6* 23.6* 24.6* 31.6*  MCV 92.4 95.5 94.1 94.8 95.0 96.0  PLT 176 188 193 203 240 320   Cardiac Enzymes: No results for input(s): CKTOTAL, CKMB, CKMBINDEX, TROPONINI in the last 168 hours. BNP: Invalid input(s): POCBNP CBG:  Recent Labs Lab 08/16/17 1614 08/16/17 1947 08/16/17 2339 08/17/17 0407 08/17/17 0737  GLUCAP 186* 264* 248* 237* 353*   D-Dimer No results for input(s): DDIMER in the last 72 hours. Hgb A1c  Recent Labs  08/17/17 0416  HGBA1C 6.4*   Lipid Profile No results for input(s): CHOL, HDL, LDLCALC, TRIG, CHOLHDL, LDLDIRECT in the last 72 hours. Thyroid function studies No results for input(s): TSH, T4TOTAL, T3FREE, THYROIDAB in the last 72 hours.  Invalid input(s): FREET3 Anemia work up No results for input(s): VITAMINB12, FOLATE, FERRITIN, TIBC, IRON, RETICCTPCT in the last 72 hours. Urinalysis    Component Value Date/Time   COLORURINE YELLOW 06/01/2017 0848   APPEARANCEUR CLEAR 06/01/2017 0848   LABSPEC <=1.005 (A) 06/01/2017 0848   PHURINE 6.0 06/01/2017 0848   GLUCOSEU NEGATIVE 06/01/2017 0848   HGBUR NEGATIVE 06/01/2017 0848   BILIRUBINUR NEGATIVE 06/01/2017 0848   KETONESUR NEGATIVE 06/01/2017 0848   UROBILINOGEN 0.2 06/01/2017 0848   NITRITE NEGATIVE 06/01/2017 0848   LEUKOCYTESUR NEGATIVE 06/01/2017 0848    Microbiology Recent Results (from the past 240 hour(s))  Culture, blood (x 2)      Status: None (Preliminary result)   Collection Time: 08/13/17  2:08 PM  Result Value Ref Range Status   Specimen Description BLOOD RIGHT ARM  Final   Special Requests IN PEDIATRIC BOTTLE Blood Culture adequate volume  Final   Culture   Final    NO GROWTH 3 DAYS Performed at [facility], [address] [place], [place] [address]    Report Status PENDING  Incomplete  Culture, blood (x 2)     Status: None (Preliminary result)   Collection Time: 08/13/17  2:15 PM  Result Value Ref Range Status   Specimen Description BLOOD RIGHT ARM  Final   Special Requests IN PEDIATRIC BOTTLE Blood Culture adequate volume  Final   Culture   Final  NO GROWTH 3 DAYS Performed at [facility], [place] [address] [place], [place] [address]    Report Status PENDING  Incomplete  MRSA PCR Screening     Status: None   Collection Time: 08/13/17  6:15 PM  Result Value Ref Range Status   MRSA by PCR NEGATIVE NEGATIVE Final    Comment:        The GeneXpert MRSA Assay (FDA approved for NASAL specimens only), is one component of a comprehensive MRSA colonization surveillance program. It is not intended to diagnose MRSA infection nor to guide or monitor treatment for MRSA infections.     Time coordinating discharge: Approximately 40 minutes  Vance Gather, MD  [facility] 08/17/2017, 10:54 AM Pager [phone]

## 2017-08-17 NOTE — Progress Notes (Signed)
Nutrition Note  Patient expected to discharge today.  Provided handout with tube feeding instructions and regimen. Provided goal rate for 24 and 18 hour infusions plus free water recommendations. Pt will continue to have chemotherapy and radiation treatments after discharge.  Pt has been tolerating Osmolite 1.5 @ 65 ml/hr x 24 hours with no issues.  Pt has been trying to drink as much fluid as possible but reports pain with swallowing still.  Clayton Bibles, MS, RD, LDN Pager: [phone] After Hours Pager: [phone]

## 2017-08-18 ENCOUNTER — Ambulatory Visit
Admission: RE | Admit: 2017-08-18 | Discharge: 2017-08-18 | Disposition: A | Discharge status: Home / Self Care | Payer: Managed Care, Other (non HMO) | Source: Ambulatory Visit | Attending: Radiation Oncology | Admitting: Radiation Oncology

## 2017-08-18 ENCOUNTER — Encounter: Payer: Self-pay | Admitting: Radiation Oncology

## 2017-08-18 DIAGNOSIS — Z800 Family history of malignant neoplasm of digestive organs: Secondary | ICD-10-CM | POA: Diagnosis not present

## 2017-08-18 DIAGNOSIS — C3412 Malignant neoplasm of upper lobe, left bronchus or lung: Secondary | ICD-10-CM

## 2017-08-18 DIAGNOSIS — F17210 Nicotine dependence, cigarettes, uncomplicated: Secondary | ICD-10-CM | POA: Diagnosis not present

## 2017-08-18 DIAGNOSIS — Z79899 Other long term (current) drug therapy: Secondary | ICD-10-CM | POA: Diagnosis not present

## 2017-08-18 DIAGNOSIS — Z8371 Family history of colonic polyps: Secondary | ICD-10-CM | POA: Diagnosis not present

## 2017-08-18 DIAGNOSIS — Z8701 Personal history of pneumonia (recurrent): Secondary | ICD-10-CM | POA: Diagnosis not present

## 2017-08-18 DIAGNOSIS — Z510 Encounter for antineoplastic radiation therapy: Secondary | ICD-10-CM | POA: Diagnosis present

## 2017-08-18 DIAGNOSIS — I10 Essential (primary) hypertension: Secondary | ICD-10-CM | POA: Diagnosis not present

## 2017-08-18 DIAGNOSIS — C321 Malignant neoplasm of supraglottis: Secondary | ICD-10-CM | POA: Diagnosis not present

## 2017-08-18 DIAGNOSIS — Z808 Family history of malignant neoplasm of other organs or systems: Secondary | ICD-10-CM | POA: Diagnosis not present

## 2017-08-18 DIAGNOSIS — Z8249 Family history of ischemic heart disease and other diseases of the circulatory system: Secondary | ICD-10-CM | POA: Diagnosis not present

## 2017-08-18 DIAGNOSIS — Z807 Family history of other malignant neoplasms of lymphoid, hematopoietic and related tissues: Secondary | ICD-10-CM | POA: Diagnosis not present

## 2017-08-18 DIAGNOSIS — F4321 Adjustment disorder with depressed mood: Secondary | ICD-10-CM | POA: Diagnosis not present

## 2017-08-18 DIAGNOSIS — K219 Gastro-esophageal reflux disease without esophagitis: Secondary | ICD-10-CM | POA: Diagnosis not present

## 2017-08-18 DIAGNOSIS — Z7982 Long term (current) use of aspirin: Secondary | ICD-10-CM | POA: Diagnosis not present

## 2017-08-18 DIAGNOSIS — Z803 Family history of malignant neoplasm of breast: Secondary | ICD-10-CM | POA: Diagnosis not present

## 2017-08-18 DIAGNOSIS — F419 Anxiety disorder, unspecified: Secondary | ICD-10-CM | POA: Diagnosis not present

## 2017-08-18 DIAGNOSIS — J449 Chronic obstructive pulmonary disease, unspecified: Secondary | ICD-10-CM | POA: Diagnosis not present

## 2017-08-18 LAB — CULTURE, BLOOD (ROUTINE X 2)
CULTURE: NO GROWTH
Culture: NO GROWTH
SPECIAL REQUESTS: ADEQUATE
Special Requests: ADEQUATE

## 2017-08-18 MED ORDER — BIAFINE EX EMUL
Freq: Two times a day (BID) | CUTANEOUS | Status: DC
  Administered 2017-08-18: 14:00:00 via TOPICAL

## 2017-08-19 ENCOUNTER — Telehealth: Payer: Self-pay

## 2017-08-19 ENCOUNTER — Telehealth: Payer: Self-pay | Admitting: Nutrition

## 2017-08-19 NOTE — Telephone Encounter (Signed)
Anderson Malta RN with [facility] called. She stated the tube feed was going at 65 ml/hr continuous in the hospital. The order for home was to start at 20 ml/hr and increase by 10 ml/hr every 8 hours with goal of 65 ml/hr and had another order to start at 69ml/hr.  He did 65/hr on Monday night and had diarrhea all day yesterday. About 5 times water. But he was drinking and putting water down his tube. The RN told him to restart at 20 ml/hr and titrate up. She called him today and no diarrhea today, he is at 40 ms/hr at present with goal of 65.   The pt is requesting he be able to do bolus feeds with total of 6 cans / day instead of the continuous feed.   He is having CBC and BMP drawn next week. She is asking for order to access port to draw these.  S/w Dr Julien Nordmann and he requested dietician address tube feeds.OK to use port for lab draw per protocol.   Jennifer's phone is [phone]

## 2017-08-19 NOTE — Telephone Encounter (Signed)
Spoke with patient and scheduled appt per 10/10 sch msg.

## 2017-08-19 NOTE — Progress Notes (Signed)
  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     Indication for treatment:  Curative concurrent chemoradiation       Radiation treatment dates:   06/30/2017 to 08/18/2017  Site/dose:    1. The lung and larynx were treated to 44 Gy in 22 fractions of 2 Gy. 2. The targets were boosted to 22 Gy in 11 fractions of 2 Gy.   Beams/energy:    1. TruBeam // 10X, 6X 2. TruBeam // 10X, 6X  Narrative: The patient tolerated radiation treatment relatively well.  He developed brisk erythema to the neck with throat and neck irritation and difficulty with swallowing. He required PEG placement on 08/10/17 for nutrition and tolerated this well.  He reported productive cough with clear phlegm and shortness of breath with activity but denied nausea or vomiting.  Plan: The patient has completed radiation treatment. The patient was scheduled to meet with patient navigator, Gayleen Orem, to discuss pain management options, use of IV fluids, and possible feeding tube. The patient will return to radiation oncology clinic for routine followup in one month. I advised him to call or return sooner if he has any questions or concerns related to his recovery or treatment. ________________________________  Sheral Apley. Tammi Klippel, M.D.   This document serves as a record of services personally performed by Tyler Pita, MD. It was created on his behalf by Arlyce Harman, a trained medical scribe. The creation of this record is based on the scribe's personal observations and the provider's statements to them. This document has been  checked and approved by the attending provider.

## 2017-08-21 ENCOUNTER — Encounter: Payer: Managed Care, Other (non HMO) | Admitting: Nutrition

## 2017-08-24 ENCOUNTER — Ambulatory Visit: Payer: Managed Care, Other (non HMO) | Admitting: Nutrition

## 2017-08-24 NOTE — Progress Notes (Signed)
51 year old male diagnosed with non-small cell lung cancer/glottis cancer who is post treatment, concurrent chemoradiation therapy.  He is a patient of Dr. Julien Nordmann.  Past medical history includes anxiety, COPD, GERD, headaches, hypertension, pneumonia, depression.  Medications include vitamin D, Klonopin, Nexium, Compazine, and Carafate.  Labs include albumin 2.9.  Height: 5 feet 10 inches. Weight: 150.6 pounds Weight was documented as 177 pounds in March 2018. BMI: 21.  Patient reports he is only using 3 bottles of Osmolite 1.5 daily. Reports he infuses formula at 65 mL an hour during the day. Patient reports last bowel movement was Friday.  He feels constipated.  He plans on beginning MiraLAX. Reports he is able to eat soup and drink 40 ounces of water daily by mouth. Weight improved by 2 pounds over the past month. Patient requesting change to bolus feeding.  Nutrition diagnosis: Inadequate oral intake related to cancer as evidenced by patient's need for tube feeding.  Intervention: Educated patient to discontinue continuous feedings. Educated patient to begin one bottle Osmolite 1.5, 3 times a day between meals with 60 mL free water before and after each bolus feeding. Stressed the importance of slow administration of tube feeding. Educated patient on the importance of increasing oral intake of food at mealtimes and oral nutrition supplements as needed. Questions were answered.  Teach back method used.  Contact information provided and written instructions were given to patient.  Monitoring, evaluation, goals: Patient will begin to increase oral intake.   He will tolerate bolus tube feeding to provide 1065 cal, 44.7 g protein, 543 mL free water.  Next visit: Patient agrees to contact me with any questions or concerns.  **Disclaimer: This note was dictated with voice recognition software. Similar sounding words can inadvertently be transcribed and this note may contain  transcription errors which may not have been corrected upon publication of note.**

## 2017-08-25 ENCOUNTER — Telehealth: Payer: Self-pay | Admitting: *Deleted

## 2017-08-25 ENCOUNTER — Encounter: Payer: Self-pay | Admitting: *Deleted

## 2017-08-25 ENCOUNTER — Encounter: Payer: Self-pay | Admitting: Internal Medicine

## 2017-08-25 DIAGNOSIS — C3412 Malignant neoplasm of upper lobe, left bronchus or lung: Secondary | ICD-10-CM

## 2017-08-25 LAB — CBC AND DIFFERENTIAL
HCT: 28 — AB (ref 41–53)
HEMOGLOBIN: 9.3 — AB (ref 13.5–17.5)
Neutrophils Absolute: 7
Platelets: 512 — AB (ref 150–399)
WBC: 7.9

## 2017-08-25 LAB — BASIC METABOLIC PANEL
BUN: 7 (ref 4–21)
CREATININE: 0.5 — AB (ref <=1.3)
GLUCOSE: 198
POTASSIUM: 4.1 (ref 3.4–5.3)
Sodium: 137 (ref 137–147)

## 2017-08-25 NOTE — Progress Notes (Signed)
Oncology Nurse Navigator Documentation  Oncology Nurse Navigator Flowsheets 08/25/2017  Navigator Location [facility]-  Navigator Encounter Type Letter/Fax/Email;Other/I received an email from patient's wife. She had questions about his CT scan tomorrow. I updated Dr. Julien Nordmann and he states to cancel CT and to schedule a PET scan before 10/29.  I called central scheduling and received an appt time.  I called and spoke with wife and updated, she was thankful for the call. I also emailed her the appt time for the PET scan and pre-procedure instructions.   Treatment Phase Follow-up  Barriers/Navigation Needs Coordination of Care;Education  Education Other  Interventions Coordination of Care;Education  Coordination of Care Other;Appts  Education Method Verbal;Written  Acuity Level 2  Acuity Level 2 Educational needs;Assistance expediting appointments  Time Spent with Patient 30

## 2017-08-25 NOTE — Telephone Encounter (Signed)
Santiago Glad with [facility] called [facility]-  pt drinking 2 bottles of water day, BP 90/52 HR 104, pt asking to come in for IVF. Will review with MD.

## 2017-08-26 ENCOUNTER — Ambulatory Visit (HOSPITAL_BASED_OUTPATIENT_CLINIC_OR_DEPARTMENT_OTHER): Payer: Managed Care, Other (non HMO) | Admitting: Medical

## 2017-08-26 ENCOUNTER — Ambulatory Visit (HOSPITAL_COMMUNITY): Payer: Managed Care, Other (non HMO)

## 2017-08-26 ENCOUNTER — Telehealth: Payer: Self-pay | Admitting: Medical Oncology

## 2017-08-26 ENCOUNTER — Ambulatory Visit (HOSPITAL_BASED_OUTPATIENT_CLINIC_OR_DEPARTMENT_OTHER): Payer: Managed Care, Other (non HMO)

## 2017-08-26 VITALS — BP 117/65 | HR 88 | Temp 98.6°F | Resp 16 | Ht 71.0 in | Wt 149.1 lb

## 2017-08-26 DIAGNOSIS — R0989 Other specified symptoms and signs involving the circulatory and respiratory systems: Secondary | ICD-10-CM

## 2017-08-26 DIAGNOSIS — R070 Pain in throat: Secondary | ICD-10-CM | POA: Diagnosis not present

## 2017-08-26 DIAGNOSIS — C321 Malignant neoplasm of supraglottis: Secondary | ICD-10-CM

## 2017-08-26 DIAGNOSIS — R1310 Dysphagia, unspecified: Secondary | ICD-10-CM | POA: Diagnosis not present

## 2017-08-26 DIAGNOSIS — Z95828 Presence of other vascular implants and grafts: Secondary | ICD-10-CM

## 2017-08-26 DIAGNOSIS — I951 Orthostatic hypotension: Secondary | ICD-10-CM | POA: Diagnosis not present

## 2017-08-26 DIAGNOSIS — C3492 Malignant neoplasm of unspecified part of left bronchus or lung: Secondary | ICD-10-CM

## 2017-08-26 LAB — CBC WITH DIFFERENTIAL/PLATELET
BASO%: 0.4 % (ref 0.0–2.0)
Basophils Absolute: 0 10*3/uL (ref 0.0–0.1)
EOS%: 0.3 % (ref 0.0–7.0)
Eosinophils Absolute: 0 10*3/uL (ref 0.0–0.5)
HCT: 29.4 % — ABNORMAL LOW (ref 38.4–49.9)
HGB: 9.9 g/dL — ABNORMAL LOW (ref 13.0–17.1)
LYMPH%: 4.2 % — AB (ref 14.0–49.0)
MCH: 31.8 pg (ref 27.2–33.4)
MCHC: 33.6 g/dL (ref 32.0–36.0)
MCV: 94.8 fL (ref 79.3–98.0)
MONO#: 1 10*3/uL — AB (ref 0.1–0.9)
MONO%: 14.3 % — ABNORMAL HIGH (ref 0.0–14.0)
NEUT%: 80.8 % — ABNORMAL HIGH (ref 39.0–75.0)
NEUTROS ABS: 5.6 10*3/uL (ref 1.5–6.5)
PLATELETS: 535 10*3/uL — AB (ref 140–400)
RBC: 3.1 10*6/uL — ABNORMAL LOW (ref 4.20–5.82)
RDW: 15.7 % — ABNORMAL HIGH (ref 11.0–14.6)
WBC: 6.9 10*3/uL (ref 4.0–10.3)
lymph#: 0.3 10*3/uL — ABNORMAL LOW (ref 0.9–3.3)

## 2017-08-26 LAB — COMPREHENSIVE METABOLIC PANEL
ALBUMIN: 2.6 g/dL — AB (ref 3.5–5.0)
ALK PHOS: 74 U/L (ref 40–150)
ALT: 26 U/L (ref 0–55)
ANION GAP: 10 meq/L (ref 3–11)
AST: 18 U/L (ref 5–34)
BUN: 6.8 mg/dL — ABNORMAL LOW (ref 7.0–26.0)
CALCIUM: 9.7 mg/dL (ref 8.4–10.4)
CHLORIDE: 99 meq/L (ref 98–109)
CO2: 29 mEq/L (ref 22–29)
Creatinine: 0.6 mg/dL — ABNORMAL LOW (ref 0.7–1.3)
Glucose: 94 mg/dl (ref 70–140)
POTASSIUM: 4 meq/L (ref 3.5–5.1)
Sodium: 139 mEq/L (ref 136–145)
Total Bilirubin: 0.4 mg/dL (ref 0.20–1.20)
Total Protein: 6.3 g/dL — ABNORMAL LOW (ref 6.4–8.3)

## 2017-08-26 MED ORDER — SODIUM CHLORIDE 0.9% FLUSH
10.0000 mL | Freq: Once | INTRAVENOUS | Status: AC
  Administered 2017-08-26: 10 mL via INTRAVENOUS
  Filled 2017-08-26: qty 10

## 2017-08-26 MED ORDER — HYDROMORPHONE HCL 2 MG/ML IJ SOLN
INTRAMUSCULAR | Status: AC
  Filled 2017-08-26: qty 1

## 2017-08-26 MED ORDER — HEPARIN SOD (PORK) LOCK FLUSH 100 UNIT/ML IV SOLN
500.0000 [IU] | Freq: Once | INTRAVENOUS | Status: AC
  Administered 2017-08-26: 500 [IU]
  Filled 2017-08-26: qty 5

## 2017-08-26 MED ORDER — SODIUM CHLORIDE 0.9 % IV SOLN
Freq: Once | INTRAVENOUS | Status: AC
  Administered 2017-08-26: 15:00:00 via INTRAVENOUS

## 2017-08-26 MED ORDER — MORPHINE SULFATE 4 MG/ML IJ SOLN
2.0000 mg | Freq: Once | INTRAMUSCULAR | Status: DC
  Filled 2017-08-26: qty 1

## 2017-08-26 MED ORDER — SODIUM CHLORIDE 0.9% FLUSH
10.0000 mL | Freq: Once | INTRAVENOUS | Status: AC
  Administered 2017-08-26: 10 mL
  Filled 2017-08-26: qty 10

## 2017-08-26 MED ORDER — ALBUTEROL SULFATE (2.5 MG/3ML) 0.083% IN NEBU
INHALATION_SOLUTION | RESPIRATORY_TRACT | Status: AC
  Filled 2017-08-26: qty 3

## 2017-08-26 MED ORDER — HEPARIN SOD (PORK) LOCK FLUSH 100 UNIT/ML IV SOLN
500.0000 [IU] | Freq: Once | INTRAVENOUS | Status: AC
  Administered 2017-08-26: 500 [IU] via INTRAVENOUS
  Filled 2017-08-26: qty 5

## 2017-08-26 MED ORDER — HYDROMORPHONE HCL 4 MG/ML IJ SOLN
2.0000 mg | Freq: Once | INTRAMUSCULAR | Status: AC
  Administered 2017-08-26: 2 mg via INTRAVENOUS
  Filled 2017-08-26: qty 1

## 2017-08-26 MED ORDER — ALBUTEROL SULFATE (2.5 MG/3ML) 0.083% IN NEBU
2.5000 mg | INHALATION_SOLUTION | Freq: Once | RESPIRATORY_TRACT | Status: AC
  Administered 2017-08-26: 2.5 mg via RESPIRATORY_TRACT
  Filled 2017-08-26: qty 3

## 2017-08-26 NOTE — Patient Instructions (Signed)
Dehydration, Adult Dehydration is a condition in which there is not enough fluid or water in the body. This happens when you lose more fluids than you take in. Important organs, such as the kidneys, brain, and heart, cannot function without a proper amount of fluids. Any loss of fluids from the body can lead to dehydration. Dehydration can range from mild to severe. This condition should be treated right away to prevent it from becoming severe. What are the causes? This condition may be caused by:  Vomiting.  Diarrhea.  Excessive sweating, such as from heat exposure or exercise.  Not drinking enough fluid, especially: ? When ill. ? While doing activity that requires a lot of energy.  Excessive urination.  Fever.  Infection.  Certain medicines, such as medicines that cause the body to lose excess fluid (diuretics).  Inability to access safe drinking water.  Reduced physical ability to get adequate water and food.  What increases the risk? This condition is more likely to develop in people:  Who have a poorly controlled long-term (chronic) illness, such as diabetes, heart disease, or kidney disease.  Who are age 65 or older.  Who are disabled.  Who live in a place with high altitude.  Who play endurance sports.  What are the signs or symptoms? Symptoms of mild dehydration may include:  Thirst.  Dry lips.  Slightly dry mouth.  Dry, warm skin.  Dizziness. Symptoms of moderate dehydration may include:  Very dry mouth.  Muscle cramps.  Dark urine. Urine may be the color of tea.  Decreased urine production.  Decreased tear production.  Heartbeat that is irregular or faster than normal (palpitations).  Headache.  Light-headedness, especially when you stand up from a sitting position.  Fainting (syncope). Symptoms of severe dehydration may include:  Changes in skin, such as: ? Cold and clammy skin. ? Blotchy (mottled) or pale skin. ? Skin that does  not quickly return to normal after being lightly pinched and released (poor skin turgor).  Changes in body fluids, such as: ? Extreme thirst. ? No tear production. ? Inability to sweat when body temperature is high, such as in hot weather. ? Very little urine production.  Changes in vital signs, such as: ? Weak pulse. ? Pulse that is more than 100 beats a minute when sitting still. ? Rapid breathing. ? Low blood pressure.  Other changes, such as: ? Sunken eyes. ? Cold hands and feet. ? Confusion. ? Lack of energy (lethargy). ? Difficulty waking up from sleep. ? Short-term weight loss. ? Unconsciousness. How is this diagnosed? This condition is diagnosed based on your symptoms and a physical exam. Blood and urine tests may be done to help confirm the diagnosis. How is this treated? Treatment for this condition depends on the severity. Mild or moderate dehydration can often be treated at home. Treatment should be started right away. Do not wait until dehydration becomes severe. Severe dehydration is an emergency and it needs to be treated in a hospital. Treatment for mild dehydration may include:  Drinking more fluids.  Replacing salts and minerals in your blood (electrolytes) that you may have lost. Treatment for moderate dehydration may include:  Drinking an oral rehydration solution (ORS). This is a drink that helps you replace fluids and electrolytes (rehydrate). It can be found at pharmacies and retail stores. Treatment for severe dehydration may include:  Receiving fluids through an IV tube.  Receiving an electrolyte solution through a feeding tube that is passed through your nose   and into your stomach (nasogastric tube, or NG tube).  Correcting any abnormalities in electrolytes.  Treating the underlying cause of dehydration. Follow these instructions at home:  If directed by your health care provider, drink an ORS: ? Make an ORS by following instructions on the  package. ? Start by drinking small amounts, about  cup (120 mL) every 5-10 minutes. ? Slowly increase how much you drink until you have taken the amount recommended by your health care provider.  Drink enough clear fluid to keep your urine clear or pale yellow. If you were told to drink an ORS, finish the ORS first, then start slowly drinking other clear fluids. Drink fluids such as: ? Water. Do not drink only water. Doing that can lead to having too little salt (sodium) in the body (hyponatremia). ? Ice chips. ? Fruit juice that you have added water to (diluted fruit juice). ? Low-calorie sports drinks.  Avoid: ? Alcohol. ? Drinks that contain a lot of sugar. These include high-calorie sports drinks, fruit juice that is not diluted, and soda. ? Caffeine. ? Foods that are greasy or contain a lot of fat or sugar.  Take over-the-counter and prescription medicines only as told by your health care provider.  Do not take sodium tablets. This can lead to having too much sodium in the body (hypernatremia).  Eat foods that contain a healthy balance of electrolytes, such as bananas, oranges, potatoes, tomatoes, and spinach.  Keep all follow-up visits as told by your health care provider. This is important. Contact a health care provider if:  You have abdominal pain that: ? Gets worse. ? Stays in one area (localizes).  You have a rash.  You have a stiff neck.  You are more irritable than usual.  You are sleepier or more difficult to wake up than usual.  You feel weak or dizzy.  You feel very thirsty.  You have urinated only a small amount of very dark urine over 6-8 hours. Get help right away if:  You have symptoms of severe dehydration.  You cannot drink fluids without vomiting.  Your symptoms get worse with treatment.  You have a fever.  You have a severe headache.  You have vomiting or diarrhea that: ? Gets worse. ? Does not go away.  You have blood or green matter  (bile) in your vomit.  You have blood in your stool. This may cause stool to look black and tarry.  You have not urinated in 6-8 hours.  You faint.  Your heart rate while sitting still is over 100 beats a minute.  You have trouble breathing. This information is not intended to replace advice given to you by your health care provider. Make sure you discuss any questions you have with your health care provider. Document Released: 10/27/2005 Document Revised: 05/23/2016 Document Reviewed: 12/21/2015 Elsevier Interactive Patient Education  2018 [employer]  

## 2017-08-26 NOTE — Progress Notes (Signed)
Ok to infuse IV fluids @ 750/hr per Sandi Mealy, PA

## 2017-08-26 NOTE — Telephone Encounter (Addendum)
F/u from phone call yesterday. [facility] cannot take pt today for ivf. I called and left a message for pt to return my call . Appts given to wife for labs and ivf today.

## 2017-08-27 NOTE — Progress Notes (Signed)
[facility] Progress Note   Trevor Zavala [id number] Mar 31, 1966 51 y.o.  [name] is managed by Dr. Eilleen Kempf  Actively treated with chemotherapy: no  Last Treated: 08/03/2017  Assessment: Plan:    Orthostasis - Plan: 0.9 %  sodium chloride infusion, sodium chloride flush (NS) 0.9 % injection 10 mL, heparin lock flush 100 unit/mL  Rhonchi - Plan: albuterol (PROVENTIL) (2.5 MG/3ML) 0.083% nebulizer solution 2.5 mg  Throat pain - Plan: HYDROmorphone (DILAUDID) injection 2 mg, sodium chloride flush (NS) 0.9 % injection 10 mL, heparin lock flush 100 unit/mL   Orthostasis: The patient was given 1 L of normal saline IV.  Rhonchi: The patient was given an albuterol treatment. His lungs cleared after this treatment.  Throat pain: Patient was given Dilaudid 2 mg IV for his esophageal pain which she was experiencing.  Please see After Visit Summary for patient specific instructions.  Future Appointments Date Time Provider [place]  08/31/2017 3:00 PM Serena Colonel, RN [facility] None  09/02/2017 2:00 PM [facility] MOBILE [facility] [place]  09/07/2017 9:30 AM [facility] LAB 6 [facility] None  09/07/2017 10:00 AM Curt Bears, MD [facility] None  09/22/2017 2:00 PM Bruning, Ashlyn, PA-C [facility] None    No orders of the defined types were placed in this encounter.     Subjective:   Patient ID:  Trevor Zavala is a 51 y.o. (DOB 26-Apr-1966) male.  Chief Complaint:  Chief Complaint  Patient presents with  . Dehydration    HPI Trevor Zavala is a 51 year old male with a history of a stage IIB/IV (T3,N3,M1a) non-small cell lung cancer, squamous cell carcinoma with a large left hilar mass in addition to mediastinal and left supraclavicular lymphadenopathy as well as a contralateral right upper lobe nodule diagnosed in 2018. The patient also has a squamous cell carcinoma of the epiglottis diagnosed in 2018. He was treated with  concurrent chemoradiation with weekly carboplatin for AUC of 2 paclitaxel 45 mg/m with his first dose given on 06/29/2017. He is status post cycle 6 given on 08/03/2017. He was last seen by Dr. Julien Nordmann at that time. Our office was contacted by home health on 08/25/2017 stating that the patient had only been drinking around 2 bottles of water per day. His blood pressure was low at 90/52 and heart rate elevated at 104. They were calling to request for the patient to come in for IV fluids. The patient was to have a CT scan completed today. Dr. Julien Nordmann opted to cancel the CT scan and to order a PET scan to be completed before 09/07/2017 when the patient is scheduled to see him in follow-up. He patient continues to do to feeds with his PEG tube and is eating some soft foods. He is following his tube feedings with 240 ml of free water 3 times daily.   Medications: I have reviewed the patient's current medications.  Allergies: No Known Allergies  Past Medical History:  Diagnosis Date  . Anxiety   . COPD (chronic obstructive pulmonary disease) ([place])   . GERD (gastroesophageal reflux disease)   . Headache   . Hypertension   . Pneumonia   . Situational depression   . Stage III squamous cell carcinoma of left lung ([place]) 06/19/2017    Past Surgical History:  Procedure Laterality Date  . COLONOSCOPY    . DIRECT LARYNGOSCOPY N/A 06/25/2017   Procedure: DIRECT LARYNGOSCOPY AND BIOPSY;  Surgeon: Rozetta Nunnery, MD;  Location: [facility];  Service: ENT;  Laterality: N/A;  . EAR CYST EXCISION N/A 11/17/2013   Procedure: SEBACEOUS CYST CHEST;  Surgeon: Joyice Faster. Cornett, MD;  Location: [place];  Service: General;  Laterality: N/A;  . IR GASTROSTOMY TUBE MOD SED  08/10/2017  . KNEE ARTHROSCOPY     LEFT  . LIPOMA EXCISION N/A 11/17/2013   Procedure: EXCISION LIPOMA FOREHEAD;  Surgeon: Joyice Faster. Cornett, MD;  Location: [place];  Service: General;   Laterality: N/A;  . LUNG BIOPSY Bilateral 06/12/2017   Procedure: LEFT LUNG BIOPSY;  Surgeon: Grace Isaac, MD;  Location: [place];  Service: Thoracic;  Laterality: Bilateral;  . PORTACATH PLACEMENT Right 07/01/2017   Procedure: INSERTION PORT-A-CATH - RIGHT IJ - placed with Fluoro and Ultrasound;  Surgeon: Grace Isaac, MD;  Location: [place];  Service: Thoracic;  Laterality: Right;  Marland Kitchen VIDEO BRONCHOSCOPY WITH ENDOBRONCHIAL ULTRASOUND N/A 06/12/2017   Procedure: VIDEO BRONCHOSCOPY WITH ENDOBRONCHIAL ULTRASOUND;  Surgeon: Grace Isaac, MD;  Location: [facility] OR;  Service: Thoracic;  Laterality: N/A;    Family History  Problem Relation Age of Onset  . Breast cancer Mother   . Hypertension Mother   . Colon cancer Mother   . Skin cancer Father   . Hypertension Father   . Colon polyps Father   . Cancer Father 5       b cell lymphoma  . Colon cancer Father     Social History   Social History  . Marital status: Married    Spouse name: N/A  . Number of children: N/A  . Years of education: N/A   Occupational History  . Not on file.   Social History Main Topics  . Smoking status: Current Every Day Smoker    Packs/day: 0.25    Years: 35.00  . Smokeless tobacco: Never Used     Comment: down to 2 cig/day 06/30/17  . Alcohol use 0.0 oz/week     Comment: SOCIAL  . Drug use: No  . Sexual activity: Yes   Other Topics Concern  . Not on file   Social History Narrative   Daily Caffeine Use:  3    Past Medical History, Surgical history, Social history, and Family history were reviewed and updated as appropriate.   Please see review of systems for further details on the patient's review from today.   Review of Systems:  Review of Systems  Constitutional: Positive for fatigue. Negative for chills, diaphoresis and fever.  HENT: Positive for sore throat, trouble swallowing and voice change.   Neurological: Positive for dizziness.    Objective:   Physical Exam:  BP 117/65  (BP Location: Left Arm, Patient Position: Sitting)   Pulse 88   Temp 98.6 F (37 C) (Oral)   Resp 16   Ht 5\' 11"  (1.803 m)   Wt 149 lb 1.6 oz (67.6 kg)   SpO2 98%   BMI 20.80 kg/m  ECOG: 1  Physical Exam  Constitutional: No distress.  HENT:  Head: Normocephalic and atraumatic.  Eyes: Right eye exhibits no discharge. Left eye exhibits no discharge. No scleral icterus.  Neck: Normal range of motion. Neck supple.  Cardiovascular: Normal rate, regular rhythm and normal heart sounds.  Exam reveals no gallop and no friction rub.   No murmur heard. Pulmonary/Chest: Effort normal. No respiratory distress. He has no wheezes. He has no rales.  Abdominal: Soft. Bowel sounds are normal. He exhibits no distension and no mass. There is  no tenderness. There is no rebound and no guarding.    Lymphadenopathy:    He has no cervical adenopathy.  Skin: He is not diaphoretic.    Lab Review:     Component Value Date/Time   NA 139 08/26/2017 1232   K 4.0 08/26/2017 1232   CL 95 (L) 08/17/2017 0416   CO2 29 08/26/2017 1232   GLUCOSE 94 08/26/2017 1232   BUN 6.8 (L) 08/26/2017 1232   CREATININE 0.6 (L) 08/26/2017 1232   CALCIUM 9.7 08/26/2017 1232   PROT 6.3 (L) 08/26/2017 1232   ALBUMIN 2.6 (L) 08/26/2017 1232   AST 18 08/26/2017 1232   ALT 26 08/26/2017 1232   ALKPHOS 74 08/26/2017 1232   BILITOT 0.40 08/26/2017 1232   GFRNONAA >60 08/17/2017 0416   GFRAA >60 08/17/2017 0416       Component Value Date/Time   WBC 6.9 08/26/2017 1232   WBC 5.2 08/17/2017 0416   RBC 3.10 (L) 08/26/2017 1232   RBC 3.29 (L) 08/17/2017 0416   HGB 9.9 (L) 08/26/2017 1232   HCT 29.4 (L) 08/26/2017 1232   PLT 535 (H) 08/26/2017 1232   MCV 94.8 08/26/2017 1232   MCH 31.8 08/26/2017 1232   MCH 31.9 08/17/2017 0416   MCHC 33.6 08/26/2017 1232   MCHC 33.2 08/17/2017 0416   RDW 15.7 (H) 08/26/2017 1232   LYMPHSABS 0.3 (L) 08/26/2017 1232   MONOABS 1.0 (H) 08/26/2017 1232   EOSABS 0.0 08/26/2017 1232    BASOSABS 0.0 08/26/2017 1232   -------------------------------  Imaging from last 24 hours (if applicable):  Radiology interpretation: Dg Chest 2 View  Result Date: 08/07/2017 CLINICAL DATA:  Productive cough. Shortness of breath with exertion. Currently undergoing chemotherapy and radiation therapy for throat and lung cancer. Ex-smoker. EXAM: CHEST  2 VIEW COMPARISON:  07/01/2017. FINDINGS: Normal with sized heart. Clear lungs. Right jugular porta catheter tip at the junction of the superior vena cava and right atrium. Thoracic spine degenerative changes. IMPRESSION: No acute abnormality. Electronically Signed   By: Claudie Revering M.D.   On: 08/07/2017 18:30   Ct Soft Tissue Neck W Contrast  Result Date: 08/07/2017 CLINICAL DATA:  Shortness of breath and neck swelling after radiation. History of lung cancer. EXAM: CT NECK WITH CONTRAST TECHNIQUE: Multidetector CT imaging of the neck was performed using the standard protocol following the bolus administration of intravenous contrast. CONTRAST:  100 cc Isovue 370 COMPARISON:  PET- CT June 10, 2017 FINDINGS: PHARYNX AND LARYNX: Mild late data/infiltrated paraglottic fat with thickened edematous epiglottis. Patent airway. Normal larynx. SALIVARY GLANDS: Normal. THYROID: Normal. LYMPH NODES: 13 mm LEFT supraclavicular lymph node was 16 mm. VASCULAR: Normal. LIMITED INTRACRANIAL: Normal. VISUALIZED ORBITS: Normal. MASTOIDS AND VISUALIZED PARANASAL SINUSES: Small LEFT maxillary sinus mucosal retention cyst. SKELETON: Nonacute.  RIGHT chest Port-A-Cath. UPPER CHEST: Please see CT chest from same day, reported separately for dedicated findings. OTHER: None. IMPRESSION: 1. Mild post radiation supraglottic edema, patent airway. 2. Smaller pathologic LEFT supraclavicular lymph node. Electronically Signed   By: Elon Alas M.D.   On: 08/07/2017 21:52   Ct Angio Chest Pe W Or Wo Contrast  Result Date: 08/13/2017 CLINICAL DATA:  51 year old male with history  of lung cancer. Ongoing chemotherapy and radiation therapy. Anterior for chest pain and cough since mid July. Evaluate for pulmonary embolism. EXAM: CT ANGIOGRAPHY CHEST WITH CONTRAST TECHNIQUE: Multidetector CT imaging of the chest was performed using the standard protocol during bolus administration of intravenous contrast. Multiplanar CT image reconstructions and  MIPs were obtained to evaluate the vascular anatomy. CONTRAST:  100 mL of Isovue 370. COMPARISON:  Chest CTA 08/03/2017. FINDINGS: Cardiovascular: No filling defects within the pulmonary arterial tree to suggest underlying pulmonary embolism. Heart size is normal. There is no significant pericardial fluid, thickening or pericardial calcification. Right internal jugular single-lumen porta cath with tip terminating in the right atrium. Aortic atherosclerosis, without evidence of aneurysm or dissection in the thoracic aorta. No definite coronary artery calcifications are identified. Mediastinum/Nodes: Previously noted mass in the left hilar region appears slightly larger than the prior examination measuring 5.3 x 6.4 cm on today's study (axial image 43 of series 4). Esophagus is unremarkable in appearance. No axillary lymphadenopathy. Lungs/Pleura: New patchy asymmetrically distributed areas of ground-glass attenuation, septal thickening and thickening of the peribronchovascular interstitium are seen scattered throughout the lungs bilaterally, most evident in the right upper lobe, right lower lobe and inferior aspect of the left lower lobe. More confluent consolidation is also noted in the posterior aspect of the right lower lobe. New small right pleural effusion lying dependently. Upper Abdomen: Unremarkable. Musculoskeletal: There are no aggressive appearing lytic or blastic lesions noted in the visualized portions of the skeleton. Review of the MIP images confirms the above findings. IMPRESSION: 1. New patchy multifocal interstitial and airspace disease  throughout the lungs bilaterally is asymmetrically distributed. These findings can be seen in the setting of developing multilobar bronchopneumonia, however, in the setting of recent radiation therapy, areas of postradiation pneumonitis are suspected, and correlation with patient's symptoms and the known the radiation portal should help discriminate between infection and postradiation changes. 2. New small right pleural effusion. 3. Previously noted mass centered in the left hilar region appears slightly larger than prior examination, as above. 4. Aortic atherosclerosis. Aortic Atherosclerosis (ICD10-I70.0). Electronically Signed   By: Vinnie Langton M.D.   On: 08/13/2017 13:58   Ct Angio Chest Pe W And/or Wo Contrast  Result Date: 08/07/2017 CLINICAL DATA:  PE suspected, high pretest prob. Shortness of breath and neck swelling. Postradiation, stage III squamous cell carcinoma of left lung. EXAM: CT ANGIOGRAPHY CHEST WITH CONTRAST TECHNIQUE: Multidetector CT imaging of the chest was performed using the standard protocol during bolus administration of intravenous contrast. Multiplanar CT image reconstructions and MIPs were obtained to evaluate the vascular anatomy. CONTRAST:  100 cc Isovue 370 IV COMPARISON:  Chest radiograph earlier this day. PET-CT 06/10/2017. Chest CT PE protocol 06/06/2017 FINDINGS: Cardiovascular: Stable encasement of the left pulmonary artery at the left hilum without filling defects to suggest pulmonary embolus. Small caliber left upper lobe pulmonary vessels secondary to encasement. No filling defects throughout the right pulmonary arteries to suggest pulmonary embolus. The heart is normal in size. Normal caliber thoracic aorta with mild atherosclerosis. Right chest port in place. No evidence of IVC thrombus. No pericardial effusion. Mediastinum/Nodes: Left hilar mass (measurement includes the left pulmonary artery) measures 5.4 x 5.3 cm, slight decrease from prior PET using same  measurement technique. This encases the left pulmonary artery and left upper lobe bronchus. Left supraclavicular node measures 12 mm previously 16 mm. Decreased size of left paratracheal node from prior PET. No new mediastinal or hilar adenopathy. The hypermetabolic right infrahilar node on prior PET is normal in size. Lungs/Pleura: Mild emphysema. The previous 12 mm spiculated right upper lobe nodule has resolved with faint scarring and cystic focus image 32 series 12. Improving postobstructive pneumonitis in the lingula and left upper lobe. 5 mm faint left upper lobe nodule image 23 series 12,  and decreased from prior exam. No new pulmonary nodule or focal airspace disease. There is bubbly debris within the left mainstem bronchi and right bronchus intermedius. No pleural fluid. Upper Abdomen: No acute finding.  No adrenal nodule. Musculoskeletal: There are no acute or suspicious osseous abnormalities. No focal bone lesion. Review of the MIP images confirms the above findings. IMPRESSION: 1. No pulmonary embolus. 2. Left hilar mass has decreased in size, persistent encasement of the left pulmonary artery. Decreased postobstructive pneumonitis in the left upper lobe. 3. Previous spiculated right upper lobe 12 mm nodule has resolved with minimal residual scarring/ cystic focus. 4. Left supraclavicular node has decreased in size. 5. Bubbly debris within the left mainstem bronchus and right bronchus intermedius suggesting mucus. Aortic Atherosclerosis (ICD10-I70.0) and Emphysema (ICD10-J43.9). Electronically Signed   By: Jeb Levering M.D.   On: 08/07/2017 21:58   Ir Gastrostomy Tube Mod Sed  Result Date: 08/10/2017 INDICATION: Head and neck cancer, receiving chemotherapy and radiation, dysphagia EXAM: TWENTY FRENCH PULL-THROUGH GASTROSTOMY Date:  10/1/201810/11/2016 9:21 am Radiologist:  M. Daryll Brod, MD Guidance:  Fluoroscopic MEDICATIONS: Ancef 2 g; Antibiotics were administered within 1 hour of the  procedure. Glucagon 0.5 mg IV ANESTHESIA/SEDATION: Versed 2.0 mg IV; Fentanyl 1 high mcg IV Moderate Sedation Time:  18 minutes The patient was continuously monitored during the procedure by the interventional radiology nurse under my direct supervision. CONTRAST:  10 cc - administered into the gastric lumen. FLUOROSCOPY TIME:  Fluoroscopy Time: 1 minutes 42 seconds (32 mGy). COMPLICATIONS: None immediate. PROCEDURE: Informed consent was obtained from the patient following explanation of the procedure, risks, benefits and alternatives. The patient understands, agrees and consents for the procedure. All questions were addressed. A time out was performed. Maximal barrier sterile technique utilized including caps, mask, sterile gowns, sterile gloves, large sterile drape, hand hygiene, and betadine prep. The left upper quadrant was sterilely prepped and draped. An oral gastric catheter was inserted into the stomach under fluoroscopy. The existing nasogastric feeding tube was removed. Air was injected into the stomach for insufflation and visualization under fluoroscopy. The air distended stomach was confirmed beneath the anterior abdominal wall in the frontal and lateral projections. Under sterile conditions and local anesthesia, a 62 gauge trocar needle was utilized to access the stomach percutaneously beneath the left subcostal margin. Needle position was confirmed within the stomach under biplane fluoroscopy. Contrast injection confirmed position also. A single T tack was deployed for gastropexy. Over an Amplatz guide wire, a 9-French sheath was inserted into the stomach. A snare device was utilized to capture the oral gastric catheter. The snare device was pulled retrograde from the stomach up the esophagus and out the oropharynx. The 20-French pull-through gastrostomy was connected to the snare device and pulled antegrade through the oropharynx down the esophagus into the stomach and then through the percutaneous  tract external to the patient. The gastrostomy was assembled externally. Contrast injection confirms position in the stomach. Images were obtained for documentation. The patient tolerated procedure well. No immediate complication. IMPRESSION: Fluoroscopic insertion of a 20-French "pull-through" gastrostomy. Electronically Signed   By: Jerilynn Mages.  Shick M.D.   On: 08/10/2017 09:41   Dg Chest Port 1 View  Result Date: 08/16/2017 CLINICAL DATA:  Short of breath.  History of left lung carcinoma. EXAM: PORTABLE CHEST 1 VIEW COMPARISON:  08/12/2017 FINDINGS: Cardiac silhouette is normal in size. No mediastinal or hilar masses. Mild lung base opacity, improved from the prior exam, likely atelectasis. Lungs are otherwise clear. No pneumothorax. Right  anterior chest wall Port-A-Cath is stable and well positioned. IMPRESSION: 1. No acute cardiopulmonary disease. Electronically Signed   By: Lajean Manes M.D.   On: 08/16/2017 08:45   Dg Chest Port 1 View  Result Date: 08/12/2017 CLINICAL DATA:  Lung cancer.  Hypoxia. EXAM: PORTABLE CHEST 1 VIEW COMPARISON:  08/07/2017 FINDINGS: Stable right jugular Port-A-Cath. Normal heart size. Low volumes. Bibasilar atelectasis. No pneumothorax. IMPRESSION: Bibasilar atelectasis. Electronically Signed   By: Marybelle Killings M.D.   On: 08/12/2017 16:17        This case was discussed with Dr. Julien Nordmann. He expressed agreement with my management of this patient.

## 2017-08-28 ENCOUNTER — Other Ambulatory Visit: Payer: Self-pay | Admitting: *Deleted

## 2017-08-28 NOTE — Patient Outreach (Signed)
[facility]) Care Management  08/28/2017  Trevor Zavala 01/18/1966 [id number]   Subjective: Telephone call to patient's home number, no answer, left HIPAA compliant voicemail message, and requested call back.   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   Patient also has a history of COPD, hypertension, Protein-calorie malnutrition, moderate, and Stage III squamous cell carcinoma of left lung.     Assessment: Received [employer] Transition of care referral on 08/25/17.  Transition of care follow up pending patient contact.     Plan: RNCM will call patient for 2nd telephone outreach attempt, transition of care follow up, within 10 business days if no return call.     Azka Steger H. Annia Friendly, BSN, [place] Management [facility] Telephonic CM Phone: [phone] Fax: [phone]

## 2017-08-31 ENCOUNTER — Other Ambulatory Visit: Payer: Self-pay | Admitting: *Deleted

## 2017-08-31 NOTE — Patient Outreach (Signed)
[facility]) Care Management  08/31/2017  Trevor Zavala [birth year] [id number]   Subjective: Received voicemail message from patient's wife Trevor Zavala), states she is returning call for her husband, and request call back. Telephone call to patient's home number, no answer, left HIPAA compliant voicemail message, and requested call back.  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   Patient also has a history of COPD, hypertension, Protein-calorie malnutrition, moderate, and Stage III squamous cell carcinoma of left lung.     Assessment: Received [employer] Transition of care referral on 08/25/17.  Transition of care follow up pending patient contact.     Plan: RNCM will call patient for 3rd telephone outreach attempt, transition of care follow up, within 10 business days if no return call.     Collan Schoenfeld H. Annia Friendly, BSN, [place] Management [facility] Telephonic CM Phone: [phone] Fax: [phone]

## 2017-09-01 ENCOUNTER — Encounter: Payer: Self-pay | Admitting: *Deleted

## 2017-09-01 ENCOUNTER — Other Ambulatory Visit: Payer: Self-pay | Admitting: *Deleted

## 2017-09-01 NOTE — Patient Outreach (Addendum)
[facility]) Care Management  09/01/2017  Trevor Zavala 05-08-1966 [id number]  Subjective: Telephone call to patient's home number, spoke with patient, and HIPAA verified.  Discussed [facility] Care Management [employer] Transition of care follow up, patient voiced understanding, and is in agreement to follow up.    Patient states his throat is hurting this am and requested that this RNCM call wife [name] ) to complete transition of care follow up.  Received verbal authorization to speak with wife [name]) as needed regarding healthcare needs.  States it is ok to call wife at work.  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 Patient also has a history of COPD, hypertension, Protein-calorie malnutrition, moderate, and Stage III squamous cell carcinoma of left lung.    Assessment: Received [employer] Transition of care referral on 08/25/17. Transition of care follow up pending patient contact.     Plan: RNCM will send unsuccessful outreach  letter, [facility] pamphlet, and proceed with case closure, within 10 business days if no return call. RNCM will call patient's wife, per request  for 4th telephone outreach attempt, transition of care follow up, within 10 business days if no return call.    [name] H. Annia Friendly, BSN, [place] Management [facility] Telephonic CM Phone: [phone] Fax: [phone]

## 2017-09-02 ENCOUNTER — Ambulatory Visit (HOSPITAL_COMMUNITY)
Admission: RE | Admit: 2017-09-02 | Discharge: 2017-09-02 | Disposition: A | Discharge status: Home / Self Care | Payer: Managed Care, Other (non HMO) | Source: Ambulatory Visit | Attending: Internal Medicine | Admitting: Internal Medicine

## 2017-09-02 ENCOUNTER — Other Ambulatory Visit: Payer: Self-pay | Admitting: *Deleted

## 2017-09-02 DIAGNOSIS — R599 Enlarged lymph nodes, unspecified: Secondary | ICD-10-CM | POA: Diagnosis not present

## 2017-09-02 DIAGNOSIS — C3412 Malignant neoplasm of upper lobe, left bronchus or lung: Secondary | ICD-10-CM

## 2017-09-02 LAB — GLUCOSE, CAPILLARY: Glucose-Capillary: 101 mg/dL — ABNORMAL HIGH (ref 65–99)

## 2017-09-02 MED ORDER — FLUDEOXYGLUCOSE F - 18 (FDG) INJECTION
9.1000 | Freq: Once | INTRAVENOUS | Status: AC | PRN
  Administered 2017-09-02: 9.1 via INTRAVENOUS

## 2017-09-02 NOTE — Patient Outreach (Signed)
[facility]) Care Management  09/02/2017  Trevor Zavala 02/23/1966 [id number]   Subjective: Telephone call to patient's wife work number, per patient's request, no answer, left HIPAA compliant voicemail message, and requested call back.    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 Patient also has a history of COPD, hypertension, Protein-calorie malnutrition, moderate, and Stage III squamous cell carcinoma of left lung.    Assessment: Received Cigna Transition of care referral on 08/25/17. Transition of care follow up pending patient contact.     Plan: RNCM has sent unsuccessful outreach  letter, [facility] pamphlet, and will proceed with case closure, within 10 business days if no return call.    Decarla Siemen H. Annia Friendly, BSN, [place] Management [facility] Telephonic CM Phone: [phone] Fax: [phone]

## 2017-09-04 ENCOUNTER — Other Ambulatory Visit: Payer: Self-pay | Admitting: *Deleted

## 2017-09-04 DIAGNOSIS — C3492 Malignant neoplasm of unspecified part of left bronchus or lung: Secondary | ICD-10-CM

## 2017-09-07 ENCOUNTER — Encounter: Payer: Self-pay | Admitting: *Deleted

## 2017-09-07 ENCOUNTER — Ambulatory Visit (HOSPITAL_BASED_OUTPATIENT_CLINIC_OR_DEPARTMENT_OTHER): Payer: Managed Care, Other (non HMO) | Admitting: Internal Medicine

## 2017-09-07 ENCOUNTER — Other Ambulatory Visit (HOSPITAL_BASED_OUTPATIENT_CLINIC_OR_DEPARTMENT_OTHER): Payer: Managed Care, Other (non HMO)

## 2017-09-07 ENCOUNTER — Telehealth: Payer: Self-pay | Admitting: Internal Medicine

## 2017-09-07 ENCOUNTER — Encounter: Payer: Self-pay | Admitting: Internal Medicine

## 2017-09-07 ENCOUNTER — Other Ambulatory Visit: Payer: Self-pay | Admitting: *Deleted

## 2017-09-07 VITALS — BP 117/75 | HR 80 | Temp 97.7°F | Resp 18 | Ht 71.0 in | Wt 147.9 lb

## 2017-09-07 DIAGNOSIS — C321 Malignant neoplasm of supraglottis: Secondary | ICD-10-CM

## 2017-09-07 DIAGNOSIS — C3492 Malignant neoplasm of unspecified part of left bronchus or lung: Secondary | ICD-10-CM

## 2017-09-07 DIAGNOSIS — R5382 Chronic fatigue, unspecified: Secondary | ICD-10-CM

## 2017-09-07 LAB — COMPREHENSIVE METABOLIC PANEL
ALT: 27 U/L (ref 0–55)
ANION GAP: 10 meq/L (ref 3–11)
AST: 20 U/L (ref 5–34)
Albumin: 2.9 g/dL — ABNORMAL LOW (ref 3.5–5.0)
Alkaline Phosphatase: 75 U/L (ref 40–150)
BUN: 4.5 mg/dL — ABNORMAL LOW (ref 7.0–26.0)
CO2: 30 meq/L — AB (ref 22–29)
Calcium: 10.1 mg/dL (ref 8.4–10.4)
Chloride: 102 mEq/L (ref 98–109)
Creatinine: 0.7 mg/dL (ref 0.7–1.3)
GLUCOSE: 113 mg/dL (ref 70–140)
POTASSIUM: 4 meq/L (ref 3.5–5.1)
Sodium: 142 mEq/L (ref 136–145)
Total Bilirubin: 0.35 mg/dL (ref 0.20–1.20)
Total Protein: 6.3 g/dL — ABNORMAL LOW (ref 6.4–8.3)

## 2017-09-07 LAB — CBC WITH DIFFERENTIAL/PLATELET
BASO%: 0.6 % (ref 0.0–2.0)
Basophils Absolute: 0 10*3/uL (ref 0.0–0.1)
EOS%: 1.4 % (ref 0.0–7.0)
Eosinophils Absolute: 0.1 10*3/uL (ref 0.0–0.5)
HEMATOCRIT: 36.4 % — AB (ref 38.4–49.9)
HEMOGLOBIN: 12.2 g/dL — AB (ref 13.0–17.1)
LYMPH#: 0.4 10*3/uL — AB (ref 0.9–3.3)
LYMPH%: 6.6 % — ABNORMAL LOW (ref 14.0–49.0)
MCH: 31.9 pg (ref 27.2–33.4)
MCHC: 33.4 g/dL (ref 32.0–36.0)
MCV: 95.5 fL (ref 79.3–98.0)
MONO#: 0.5 10*3/uL (ref 0.1–0.9)
MONO%: 9.8 % (ref 0.0–14.0)
NEUT#: 4.6 10*3/uL (ref 1.5–6.5)
NEUT%: 81.6 % — ABNORMAL HIGH (ref 39.0–75.0)
Platelets: 426 10*3/uL — ABNORMAL HIGH (ref 140–400)
RBC: 3.82 10*6/uL — ABNORMAL LOW (ref 4.20–5.82)
RDW: 16.4 % — AB (ref 11.0–14.6)
WBC: 5.6 10*3/uL (ref 4.0–10.3)

## 2017-09-07 NOTE — Patient Outreach (Addendum)
[facility]) Care Management  09/07/2017  Trevor Zavala [birth year] [id number]   Subjective: Received voicemail message from patient's wife Zorawar Strollo), states she is returning call, and requested call back.  Per patient's verbal authorization.  Telephone call to patient's wife work number,spoke with wife Zale Marcotte), stated patient's name, date of birth, and address.  Discussed [facility] Care Management Cigna Transition of care follow up, wife voiced understanding, and is in agreement to follow up on patient's behalf.  Wife states patient is doing well, continues to have throat soreness when talking due to treatments, had follow up appointment with oncologist today, treatments, and appointment went well.  States they are hoping for the best and they have  assistance through extended family as needed. Wife states patient voices understanding of medical diagnosis and treatment plan.  States he is accessing his [employer] benefits as needed via member services number on back of card or through [email or web address]  States he is also utilizing services through the [employer] as needed.   States she is utilizing family medical leave act [employer]) and patient is utilizing short term disability to be off from work as needed. Wife states patient  does not have any education material, transition of care, care coordination, disease management, disease monitoring, transportation, community resource, or pharmacy needs at this time. States she is very appreciative of the follow up and is in agreement to receive [place] Management information on patient's behalf.    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 Patient also has a history of COPD, hypertension, Protein-calorie  malnutrition, moderate, and Stage III squamous cell carcinoma of left lung.    Assessment: Received Cigna Transition of care referral on 08/25/17. Transition of care follow up completed, no care management needs, and will proceed with case closure.     Plan: RNCM will send patient successful outreach letter, [facility] pamphlet, and magnet. RNCM will send case closure due to follow up completed / no care management needs request to [facility] [place] [facility].     Littleton Haub H. Annia Friendly, BSN, [place] Management [facility] Telephonic CM Phone: [phone] Fax: [phone]

## 2017-09-07 NOTE — Progress Notes (Signed)
[place] Telephone:([phone]   Fax:([phone]  OFFICE PROGRESS NOTE  [name], [name], MD [place] [address]  DIAGNOSIS:  1) Stage IIB/IV (T3, N3, M1a) non-small cell lung cancer, squamous cell carcinoma presented with large left hilar mass in addition to mediastinal and left supraclavicular lymphadenopathy as well as contralateral right upper lobe nodule diagnosed in August 2018. PDL 1 expression: 90%. 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 First dose 06/29/2017. Status post 6 cycles. Last dose was given 08/03/2017.  CURRENT THERAPY: Immunotherapy with Ketruda 200 MG IV every 3 weeks, first dose 09/17/2017.  INTERVAL HISTORY: ANNE SEBRING 51 y.o. male returns to the clinic today for follow-up visit accompanied by his wife. The patient is feeling much better today. He is able to eat solid food and using the PEG tube only as needed. He continues to have hoarseness of his voice. He denied having any chest pain, shortness of breath except with exertion, cough or hemoptysis. He denied having any fever or chills. He has no nausea, vomiting, diarrhea but has constipation. The patient tolerated the previous course of concurrent chemoradiation to the locally advanced disease in the chest and neck fairly well except for radiation induced esophagitis and weight loss. He had repeat PET scan performed recently and is here for evaluation and discussion of his scan results and treatment options.   MEDICAL HISTORY: Past Medical History:  Diagnosis Date  . Anxiety   . COPD (chronic obstructive pulmonary disease) ([place])   . GERD (gastroesophageal reflux disease)   . Headache   . Hypertension   . Pneumonia   . Situational depression   . Stage III squamous cell carcinoma of left lung ([place]) 06/19/2017    ALLERGIES:   has No Known Allergies.  MEDICATIONS:  Current Outpatient Prescriptions  Medication Sig Dispense Refill  . BREO ELLIPTA 100-25 MCG/INH AEPB INHALE 1 PUFF INTO THE LUNGS DAILY 60 each 11  . Cholecalciferol (VITAMIN D PO) Take 1 tablet by mouth daily.    . clonazePAM (KLONOPIN) 0.5 MG tablet Take 1 tablet (0.5 mg total) by mouth 2 (two) times daily as needed for anxiety. 180 tablet 0  . emollient (BIAFINE) cream Apply topically as needed.    Marland Kitchen esomeprazole (NEXIUM) 40 MG capsule TAKE 1 CAPSULE BY MOUTH DAILY WITH BREAKFAST 90 capsule 2  . fentaNYL (DURAGESIC - DOSED MCG/HR) 75 MCG/HR Place 1 patch (75 mcg total) onto the skin every 3 (three) days. 10 patch 0  . HYDROmorphone (DILAUDID) 4 MG tablet Place 1 tablet (4 mg total) into feeding tube every 6 (six) hours as needed for severe pain. 15 tablet 0  . lidocaine (XYLOCAINE) 2 % solution 5 ml po q 8 hours as needed for mouth and throat pain (Patient not taking: Reported on 08/03/2017) 100 mL 1  . lidocaine-prilocaine (EMLA) cream Apply 1 application topically as needed. 30 g 1  . loratadine (CLARITIN) 10 MG tablet Take 10 mg by mouth daily as needed for allergies.    . nicotine (NICODERM CQ) 21 mg/24hr patch Place 1 patch (21 mg total) onto the skin daily. 28 patch 0  . prochlorperazine (COMPAZINE) 10 MG tablet Take 1 tablet (10 mg total) by mouth every 6 (six) hours as needed for nausea or vomiting. 30 tablet 0  . sennosides (SENOKOT) 8.8 MG/5ML syrup Place  5 mLs into feeding tube at bedtime. 240 mL 0   No current facility-administered medications for this visit.     SURGICAL HISTORY:  Past Surgical History:  Procedure Laterality Date  . COLONOSCOPY    . DIRECT LARYNGOSCOPY N/A 06/25/2017   Procedure: DIRECT LARYNGOSCOPY AND BIOPSY;  Surgeon: Rozetta Nunnery, MD;  Location: [place];  Service: ENT;  Laterality: N/A;  . EAR CYST EXCISION N/A 11/17/2013   Procedure: SEBACEOUS CYST CHEST;  Surgeon: Joyice Faster. Cornett, MD;   Location: [place];  Service: General;  Laterality: N/A;  . IR GASTROSTOMY TUBE MOD SED  08/10/2017  . KNEE ARTHROSCOPY     LEFT  . LIPOMA EXCISION N/A 11/17/2013   Procedure: EXCISION LIPOMA FOREHEAD;  Surgeon: Joyice Faster. Cornett, MD;  Location: [place];  Service: General;  Laterality: N/A;  . LUNG BIOPSY Bilateral 06/12/2017   Procedure: LEFT LUNG BIOPSY;  Surgeon: Grace Isaac, MD;  Location: [place];  Service: Thoracic;  Laterality: Bilateral;  . PORTACATH PLACEMENT Right 07/01/2017   Procedure: INSERTION PORT-A-CATH - RIGHT IJ - placed with Fluoro and Ultrasound;  Surgeon: Grace Isaac, MD;  Location: [place];  Service: Thoracic;  Laterality: Right;  Marland Kitchen VIDEO BRONCHOSCOPY WITH ENDOBRONCHIAL ULTRASOUND N/A 06/12/2017   Procedure: VIDEO BRONCHOSCOPY WITH ENDOBRONCHIAL ULTRASOUND;  Surgeon: Grace Isaac, MD;  Location: [place];  Service: Thoracic;  Laterality: N/A;    REVIEW OF SYSTEMS:  Constitutional: positive for fatigue Eyes: negative Ears, nose, mouth, throat, and face: negative Respiratory: positive for dyspnea on exertion Cardiovascular: negative Gastrointestinal: negative Genitourinary:negative Integument/breast: negative Hematologic/lymphatic: negative Musculoskeletal:negative Neurological: negative Behavioral/Psych: negative Endocrine: negative Allergic/Immunologic: negative   PHYSICAL EXAMINATION: General appearance: alert, cooperative, fatigued and no distress Head: Normocephalic, without obvious abnormality, atraumatic Neck: no adenopathy, no JVD, supple, symmetrical, trachea midline and thyroid not enlarged, symmetric, no tenderness/mass/nodules Lymph nodes: Cervical, supraclavicular, and axillary nodes normal. Resp: clear to auscultation bilaterally Back: symmetric, no curvature. ROM normal. No CVA tenderness. Cardio: regular rate and rhythm, S1, S2 normal, no murmur, click, rub or gallop GI: soft, non-tender; bowel sounds  normal; no masses,  no organomegaly Extremities: extremities normal, atraumatic, no cyanosis or edema Neurologic: Alert and oriented X 3, normal strength and tone. Normal symmetric reflexes. Normal coordination and gait  ECOG PERFORMANCE STATUS: 1 - Symptomatic but completely ambulatory  Blood pressure 117/75, pulse 80, temperature 97.7 F (36.5 C), temperature source Oral, resp. rate 18, height 5\' 11"  (1.803 m), weight 147 lb 14.4 oz (67.1 kg), SpO2 94 %.  LABORATORY DATA: Lab Results  Component Value Date   WBC 5.6 09/07/2017   HGB 12.2 (L) 09/07/2017   HCT 36.4 (L) 09/07/2017   MCV 95.5 09/07/2017   PLT 426 (H) 09/07/2017      Chemistry      Component Value Date/Time   NA 139 08/26/2017 1232   K 4.0 08/26/2017 1232   CL 95 (L) 08/17/2017 0416   CO2 29 08/26/2017 1232   BUN 6.8 (L) 08/26/2017 1232   CREATININE 0.6 (L) 08/26/2017 1232   GLU 198 08/25/2017      Component Value Date/Time   CALCIUM 9.7 08/26/2017 1232   ALKPHOS 74 08/26/2017 1232   AST 18 08/26/2017 1232   ALT 26 08/26/2017 1232   BILITOT 0.40 08/26/2017 1232       RADIOGRAPHIC STUDIES: Ct Angio Chest Pe W Or Wo Contrast  Result Date: 08/13/2017 CLINICAL DATA:  51 year old male with history of  lung cancer. Ongoing chemotherapy and radiation therapy. Anterior for chest pain and cough since mid July. Evaluate for pulmonary embolism. EXAM: CT ANGIOGRAPHY CHEST WITH CONTRAST TECHNIQUE: Multidetector CT imaging of the chest was performed using the standard protocol during bolus administration of intravenous contrast. Multiplanar CT image reconstructions and MIPs were obtained to evaluate the vascular anatomy. CONTRAST:  100 mL of Isovue 370. COMPARISON:  Chest CTA 08/03/2017. FINDINGS: Cardiovascular: No filling defects within the pulmonary arterial tree to suggest underlying pulmonary embolism. Heart size is normal. There is no significant pericardial fluid, thickening or pericardial calcification. Right internal  jugular single-lumen porta cath with tip terminating in the right atrium. Aortic atherosclerosis, without evidence of aneurysm or dissection in the thoracic aorta. No definite coronary artery calcifications are identified. Mediastinum/Nodes: Previously noted mass in the left hilar region appears slightly larger than the prior examination measuring 5.3 x 6.4 cm on today's study (axial image 43 of series 4). Esophagus is unremarkable in appearance. No axillary lymphadenopathy. Lungs/Pleura: New patchy asymmetrically distributed areas of ground-glass attenuation, septal thickening and thickening of the peribronchovascular interstitium are seen scattered throughout the lungs bilaterally, most evident in the right upper lobe, right lower lobe and inferior aspect of the left lower lobe. More confluent consolidation is also noted in the posterior aspect of the right lower lobe. New small right pleural effusion lying dependently. Upper Abdomen: Unremarkable. Musculoskeletal: There are no aggressive appearing lytic or blastic lesions noted in the visualized portions of the skeleton. Review of the MIP images confirms the above findings. IMPRESSION: 1. New patchy multifocal interstitial and airspace disease throughout the lungs bilaterally is asymmetrically distributed. These findings can be seen in the setting of developing multilobar bronchopneumonia, however, in the setting of recent radiation therapy, areas of postradiation pneumonitis are suspected, and correlation with patient's symptoms and the known the radiation portal should help discriminate between infection and postradiation changes. 2. New small right pleural effusion. 3. Previously noted mass centered in the left hilar region appears slightly larger than prior examination, as above. 4. Aortic atherosclerosis. Aortic Atherosclerosis (ICD10-I70.0). Electronically Signed   By: Vinnie Langton M.D.   On: 08/13/2017 13:58   Ir Gastrostomy Tube Mod Sed  Result  Date: 08/10/2017 INDICATION: Head and neck cancer, receiving chemotherapy and radiation, dysphagia EXAM: TWENTY FRENCH PULL-THROUGH GASTROSTOMY Date:  10/1/201810/11/2016 9:21 am Radiologist:  M. Daryll Brod, MD Guidance:  Fluoroscopic MEDICATIONS: Ancef 2 g; Antibiotics were administered within 1 hour of the procedure. Glucagon 0.5 mg IV ANESTHESIA/SEDATION: Versed 2.0 mg IV; Fentanyl 1 high mcg IV Moderate Sedation Time:  18 minutes The patient was continuously monitored during the procedure by the interventional radiology nurse under my direct supervision. CONTRAST:  10 cc - administered into the gastric lumen. FLUOROSCOPY TIME:  Fluoroscopy Time: 1 minutes 42 seconds (32 mGy). COMPLICATIONS: None immediate. PROCEDURE: Informed consent was obtained from the patient following explanation of the procedure, risks, benefits and alternatives. The patient understands, agrees and consents for the procedure. All questions were addressed. A time out was performed. Maximal barrier sterile technique utilized including caps, mask, sterile gowns, sterile gloves, large sterile drape, hand hygiene, and betadine prep. The left upper quadrant was sterilely prepped and draped. An oral gastric catheter was inserted into the stomach under fluoroscopy. The existing nasogastric feeding tube was removed. Air was injected into the stomach for insufflation and visualization under fluoroscopy. The air distended stomach was confirmed beneath the anterior abdominal wall in the frontal and lateral projections. Under sterile conditions and local  anesthesia, a 50 gauge trocar needle was utilized to access the stomach percutaneously beneath the left subcostal margin. Needle position was confirmed within the stomach under biplane fluoroscopy. Contrast injection confirmed position also. A single T tack was deployed for gastropexy. Over an Amplatz guide wire, a 9-French sheath was inserted into the stomach. A snare device was utilized to capture  the oral gastric catheter. The snare device was pulled retrograde from the stomach up the esophagus and out the oropharynx. The 20-French pull-through gastrostomy was connected to the snare device and pulled antegrade through the oropharynx down the esophagus into the stomach and then through the percutaneous tract external to the patient. The gastrostomy was assembled externally. Contrast injection confirms position in the stomach. Images were obtained for documentation. The patient tolerated procedure well. No immediate complication. IMPRESSION: Fluoroscopic insertion of a 20-French "pull-through" gastrostomy. Electronically Signed   By: Jerilynn Mages.  Shick M.D.   On: 08/10/2017 09:41   Nm Pet Image Restag (ps) Skull Base To Thigh  Result Date: 09/02/2017 CLINICAL DATA:  Subsequent treatment strategy for LEFT upper lobe lung cancer. EXAM: NUCLEAR MEDICINE PET SKULL BASE TO THIGH TECHNIQUE: 9.1 mCi F-18 FDG was injected intravenously. Full-ring PET imaging was performed from the skull base to thigh after the radiotracer. CT data was obtained and used for attenuation correction and anatomic localization. FASTING BLOOD GLUCOSE:  Value: 101 mg/dl COMPARISON:  PET-CT 06/10/2017 FINDINGS: NECK A LEFT supraclavicular node is slightly decreased in size and metabolic activity measuring 11 mm short axis with SUV max equal 7.2 compared with 15 mm node with SUV max equal 13.1. CHEST Hypermetabolic LEFT suprahilar mass extending into the AP window is decreased in size measuring 33 x 38 mm compared to 38 x 61 mm; however the lesion remains intensely hypermetabolic with SUV max equal 15.9 compared to 15.6. There is resolution of a high LEFT paratracheal lymph node. However, there is a new hypermetabolic small RIGHT hilar lymph node with SUV max equal 7.6. This node is difficult to identify on the noncontrast CT and measure approximate 11 mm on image 80, series 4 ABDOMEN/PELVIS Interval placement of percutaneous gastrostomy tube with  metabolic activity associated with the track through the muscles which is likely inflammatory. No hypermetabolic activity liver. Normal adrenal glands. No hypermetabolic abdominopelvic lymph nodes. SKELETON No focal hypermetabolic activity to suggest skeletal metastasis. IMPRESSION: 1. Interval decrease in size of LEFT suprahilar mass with persistent intense metabolic activity. 2. Interval decrease in size and metabolic activity of LEFT supraclavicular lymph node. 3. New hypermetabolic small RIGHT hilar lymph node. 4. No evidence of distant disease progression. Electronically Signed   By: Suzy Bouchard M.D.   On: 09/02/2017 15:25   Dg Chest Port 1 View  Result Date: 08/16/2017 CLINICAL DATA:  Short of breath.  History of left lung carcinoma. EXAM: PORTABLE CHEST 1 VIEW COMPARISON:  08/12/2017 FINDINGS: Cardiac silhouette is normal in size. No mediastinal or hilar masses. Mild lung base opacity, improved from the prior exam, likely atelectasis. Lungs are otherwise clear. No pneumothorax. Right anterior chest wall Port-A-Cath is stable and well positioned. IMPRESSION: 1. No acute cardiopulmonary disease. Electronically Signed   By: Lajean Manes M.D.   On: 08/16/2017 08:45   Dg Chest Port 1 View  Result Date: 08/12/2017 CLINICAL DATA:  Lung cancer.  Hypoxia. EXAM: PORTABLE CHEST 1 VIEW COMPARISON:  08/07/2017 FINDINGS: Stable right jugular Port-A-Cath. Normal heart size. Low volumes. Bibasilar atelectasis. No pneumothorax. IMPRESSION: Bibasilar atelectasis. Electronically Signed   By: Arnell Sieving  Hoss M.D.   On: 08/12/2017 16:17    ASSESSMENT AND PLAN: This is a very pleasant 51 years old white male with recently diagnosed stage IIIB/IV non-small cell lung cancer, adenocarcinoma presented with large left hilar mass in addition to mediastinal and left supraclavicular lymphadenopathy as well as suspicious right upper lobe pulmonary nodule diagnosed in August 2018. 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 He underwent a course of concurrent chemoradiation to the lung as well as the epiglottic area under the care of Dr. Tammi Klippel. He is status post 6 cycle. He tolerated this course of treatment well except for the radiation induced esophagitis as well as weight loss and fatigue. The patient had repeat PET scan performed recently. I personally and independently reviewed the PET scan images and discuss the results and showed the images to the patient and his wife. His PET scan showed decrease in the size of the tumor in the left lung as well as the left supraclavicular lymphadenopathy but there is new mall right hypermetabolic hilar lymph node. I had a lengthy discussion with the patient and his wife about his condition. I gave him the option of palliative care and observation versus treatment with immunotherapy as he has PDL 1 expression of 90%. The patient is interested in the treatment with immunotherapy and he is expected to start the first cycle of Ketruda 200 MG IV every 3 weeks on 09/17/2017. I discussed with the patient adverse effect of this treatment including but not limited to immunotherapy mediated skin rash, diarrhea, inflammation of the lung, kidney, liver, thyroid or other endocrine dysfunction including type 1 diabetes mellitus. The patient would like to proceed with treatment as planned. For the malnutrition, he is currently able to eat solid food than he would like the PEG tube to be removed in 1-2 weeks. The patient will come back for follow-up visit in 5 weeks for evaluation before starting cycle #2 of his treatment. He was advised to call immediately if he has any concerning symptoms in the interval. The patient voices understanding of current disease status and treatment options and is in agreement with the current care plan. All questions were answered. The patient knows to call the clinic with any problems, questions or concerns. We can  certainly see the patient much sooner if necessary. Disclaimer: This note was dictated with voice recognition software. Similar sounding words can inadvertently be transcribed and may not be corrected upon review.

## 2017-09-07 NOTE — Telephone Encounter (Signed)
Gave patient avs report and appointments for November and December

## 2017-09-07 NOTE — Progress Notes (Signed)
DISCONTINUE ON PATHWAY REGIMEN - Non-Small Cell Lung     Administer weekly:     Paclitaxel      Carboplatin   **Always confirm dose/schedule in your pharmacy ordering system**    REASON: Disease Progression PRIOR TREATMENT: [id number]: Carboplatin AUC=2 + Paclitaxel 45 mg/m2 Weekly During Radiation TREATMENT RESPONSE: Progressive Disease (PD)  START ON PATHWAY REGIMEN - Non-Small Cell Lung     A cycle is 21 days:     Pembrolizumab   **Always confirm dose/schedule in your pharmacy ordering system**    Patient Characteristics: Stage IV Metastatic, Squamous, PS = 0, 1, First Line, PD-L1 Expression Positive  ? 50% (TPS) AJCC T Category: T3 Current Disease Status: Distant Metastases AJCC N Category: N3 AJCC M Category: M1a AJCC 8 Stage Grouping: IVA Histology: Squamous Cell Line of therapy: First Line PD-L1 Expression Status: PD-L1 Positive ? 50% (TPS) Performance Status: PS = 0, 1 Would you be surprised if this patient died  in the next year<= I would NOT be surprised if this patient died in the next year Intent of Therapy: Non-Curative / Palliative Intent, Discussed with Patient

## 2017-09-11 ENCOUNTER — Telehealth: Payer: Self-pay | Admitting: Internal Medicine

## 2017-09-11 NOTE — Telephone Encounter (Signed)
09/12/2017 @ 12:16 pm from [phone] Patient's wife called regarding FMLA/Disability forms.  Informed her they were with the clinician and as soon as we get them completed and signed by Dr. Julien Nordmann, I would be faxing them to the employer and reaching back out to them letting them know they were successfully faxed and she requested to receive a copy for personal records as well.

## 2017-09-17 ENCOUNTER — Other Ambulatory Visit: Payer: Self-pay | Admitting: Medical Oncology

## 2017-09-17 ENCOUNTER — Ambulatory Visit (HOSPITAL_BASED_OUTPATIENT_CLINIC_OR_DEPARTMENT_OTHER): Payer: Managed Care, Other (non HMO)

## 2017-09-17 ENCOUNTER — Telehealth: Payer: Self-pay

## 2017-09-17 ENCOUNTER — Ambulatory Visit: Payer: Managed Care, Other (non HMO) | Admitting: Internal Medicine

## 2017-09-17 ENCOUNTER — Telehealth: Payer: Self-pay | Admitting: Internal Medicine

## 2017-09-17 ENCOUNTER — Other Ambulatory Visit (HOSPITAL_BASED_OUTPATIENT_CLINIC_OR_DEPARTMENT_OTHER): Payer: Managed Care, Other (non HMO)

## 2017-09-17 ENCOUNTER — Other Ambulatory Visit: Payer: Self-pay | Admitting: *Deleted

## 2017-09-17 VITALS — BP 118/74 | HR 83 | Temp 97.9°F | Resp 18

## 2017-09-17 DIAGNOSIS — C3490 Malignant neoplasm of unspecified part of unspecified bronchus or lung: Secondary | ICD-10-CM

## 2017-09-17 DIAGNOSIS — C3412 Malignant neoplasm of upper lobe, left bronchus or lung: Secondary | ICD-10-CM

## 2017-09-17 DIAGNOSIS — C321 Malignant neoplasm of supraglottis: Secondary | ICD-10-CM

## 2017-09-17 DIAGNOSIS — R5382 Chronic fatigue, unspecified: Secondary | ICD-10-CM

## 2017-09-17 DIAGNOSIS — Z5112 Encounter for antineoplastic immunotherapy: Secondary | ICD-10-CM | POA: Diagnosis not present

## 2017-09-17 LAB — CBC WITH DIFFERENTIAL/PLATELET
BASO%: 0.1 % (ref 0.0–2.0)
BASOS ABS: 0 10*3/uL (ref 0.0–0.1)
EOS ABS: 0.1 10*3/uL (ref 0.0–0.5)
EOS%: 0.7 % (ref 0.0–7.0)
HEMATOCRIT: 36.3 % — AB (ref 38.4–49.9)
HEMOGLOBIN: 11.6 g/dL — AB (ref 13.0–17.1)
LYMPH#: 0.6 10*3/uL — AB (ref 0.9–3.3)
LYMPH%: 8.9 % — ABNORMAL LOW (ref 14.0–49.0)
MCH: 31.3 pg (ref 27.2–33.4)
MCHC: 32 g/dL (ref 32.0–36.0)
MCV: 97.8 fL (ref 79.3–98.0)
MONO#: 0.4 10*3/uL (ref 0.1–0.9)
MONO%: 5.7 % (ref 0.0–14.0)
NEUT#: 5.8 10*3/uL (ref 1.5–6.5)
NEUT%: 84.6 % — AB (ref 39.0–75.0)
Platelets: 290 10*3/uL (ref 140–400)
RBC: 3.71 10*6/uL — ABNORMAL LOW (ref 4.20–5.82)
RDW: 14.8 % — AB (ref 11.0–14.6)
WBC: 6.8 10*3/uL (ref 4.0–10.3)

## 2017-09-17 LAB — COMPREHENSIVE METABOLIC PANEL
ALBUMIN: 3.1 g/dL — AB (ref 3.5–5.0)
ALK PHOS: 77 U/L (ref 40–150)
ALT: 14 U/L (ref 0–55)
ANION GAP: 11 meq/L (ref 3–11)
AST: 11 U/L (ref 5–34)
BILIRUBIN TOTAL: 0.33 mg/dL (ref 0.20–1.20)
BUN: 5.9 mg/dL — ABNORMAL LOW (ref 7.0–26.0)
CO2: 26 mEq/L (ref 22–29)
Calcium: 9.8 mg/dL (ref 8.4–10.4)
Chloride: 106 mEq/L (ref 98–109)
Creatinine: 0.7 mg/dL (ref 0.7–1.3)
Glucose: 118 mg/dl (ref 70–140)
POTASSIUM: 3.4 meq/L — AB (ref 3.5–5.1)
Sodium: 143 mEq/L (ref 136–145)
TOTAL PROTEIN: 6.3 g/dL — AB (ref 6.4–8.3)

## 2017-09-17 LAB — RESEARCH LABS

## 2017-09-17 MED ORDER — SODIUM CHLORIDE 0.9 % IV SOLN
200.0000 mg | Freq: Once | INTRAVENOUS | Status: AC
  Administered 2017-09-17: 200 mg via INTRAVENOUS
  Filled 2017-09-17: qty 8

## 2017-09-17 MED ORDER — SODIUM CHLORIDE 0.9% FLUSH
10.0000 mL | INTRAVENOUS | Status: DC | PRN
  Administered 2017-09-17: 10 mL
  Filled 2017-09-17: qty 10

## 2017-09-17 MED ORDER — HEPARIN SOD (PORK) LOCK FLUSH 100 UNIT/ML IV SOLN
500.0000 [IU] | Freq: Once | INTRAVENOUS | Status: AC | PRN
  Administered 2017-09-17: 500 [IU]
  Filled 2017-09-17: qty 5

## 2017-09-17 MED ORDER — SODIUM CHLORIDE 0.9 % IV SOLN
Freq: Once | INTRAVENOUS | Status: AC
  Administered 2017-09-17: 14:00:00 via INTRAVENOUS

## 2017-09-17 NOTE — Telephone Encounter (Signed)
09/17/2017 @ 8:16 am called [phone] and spoke with Trevor Zavala informing him that his FMLA/Disability forms were completed and faxed to Emilio Aspen @ [phone] on 11/7 @ 7:42 am.  He requested a copy of forms be sent to [address], [place] [address]

## 2017-09-17 NOTE — Telephone Encounter (Signed)
Printed avs and calender for upcoming appointment. Per 11/8 los request

## 2017-09-17 NOTE — Patient Instructions (Addendum)
[facility] Discharge Instructions for Patients Receiving Chemotherapy  Today you received the following chemotherapy agents Keytruda  To help prevent nausea and vomiting after your treatment, we encourage you to take your nausea medication as directed  If you develop nausea and vomiting that is not controlled by your nausea medication, call the clinic.   BELOW ARE SYMPTOMS THAT SHOULD BE REPORTED IMMEDIATELY:  *FEVER GREATER THAN 100.5 F  *CHILLS WITH OR WITHOUT FEVER  NAUSEA AND VOMITING THAT IS NOT CONTROLLED WITH YOUR NAUSEA MEDICATION  *UNUSUAL SHORTNESS OF BREATH  *UNUSUAL BRUISING OR BLEEDING  TENDERNESS IN MOUTH AND THROAT WITH OR WITHOUT PRESENCE OF ULCERS  *URINARY PROBLEMS  *BOWEL PROBLEMS  UNUSUAL RASH Items with * indicate a potential emergency and should be followed up as soon as possible.  Feel free to call the clinic should you have any questions or concerns. The clinic phone number is [phone].  Please show the [facility] at check-in to the Emergency Department and triage nurse.    Pembrolizumab injection What is this medicine? PEMBROLIZUMAB (pem broe liz ue mab) is a monoclonal antibody. It is used to treat melanoma, head and neck cancer, Hodgkin lymphoma, non-small cell lung cancer, urothelial cancer, stomach cancer, and cancers that have a certain genetic condition. This medicine may be used for other purposes; ask your health care provider or pharmacist if you have questions. COMMON BRAND NAME(S): Keytruda What should I tell my health care provider before I take this medicine? They need to know if you have any of these conditions: -diabetes -immune system problems -inflammatory bowel disease -liver disease -lung or breathing disease -lupus -organ transplant -an unusual or allergic reaction to pembrolizumab, other medicines, foods, dyes, or preservatives -pregnant or trying to get pregnant -breast-feeding How should I  use this medicine? This medicine is for infusion into a vein. It is given by a health care professional in a hospital or clinic setting. A special MedGuide will be given to you before each treatment. Be sure to read this information carefully each time. Talk to your pediatrician regarding the use of this medicine in children. While this drug may be prescribed for selected conditions, precautions do apply. Overdosage: If you think you have taken too much of this medicine contact a poison control center or emergency room at once. NOTE: This medicine is only for you. Do not share this medicine with others. What if I miss a dose? It is important not to miss your dose. Call your doctor or health care professional if you are unable to keep an appointment. What may interact with this medicine? Interactions have not been studied. Give your health care provider a list of all the medicines, herbs, non-prescription drugs, or dietary supplements you use. Also tell them if you smoke, drink alcohol, or use illegal drugs. Some items may interact with your medicine. This list may not describe all possible interactions. Give your health care provider a list of all the medicines, herbs, non-prescription drugs, or dietary supplements you use. Also tell them if you smoke, drink alcohol, or use illegal drugs. Some items may interact with your medicine. What should I watch for while using this medicine? Your condition will be monitored carefully while you are receiving this medicine. You may need blood work done while you are taking this medicine. Do not become pregnant while taking this medicine or for 4 months after stopping it. Women should inform their doctor if they wish to become pregnant or think they  might be pregnant. There is a potential for serious side effects to an unborn child. Talk to your health care professional or pharmacist for more information. Do not breast-feed an infant while taking this medicine or  for 4 months after the last dose. What side effects may I notice from receiving this medicine? Side effects that you should report to your doctor or health care professional as soon as possible: -allergic reactions like skin rash, itching or hives, swelling of the face, lips, or tongue -bloody or black, tarry -breathing problems -changes in vision -chest pain -chills -constipation -cough -dizziness or feeling faint or lightheaded -fast or irregular heartbeat -fever -flushing -hair loss -low blood counts - this medicine may decrease the number of white blood cells, red blood cells and platelets. You may be at increased risk for infections and bleeding. -muscle pain -muscle weakness -persistent headache -signs and symptoms of high blood sugar such as dizziness; dry mouth; dry skin; fruity breath; nausea; stomach pain; increased hunger or thirst; increased urination -signs and symptoms of kidney injury like trouble passing urine or change in the amount of urine -signs and symptoms of liver injury like dark urine, light-colored stools, loss of appetite, nausea, right upper belly pain, yellowing of the eyes or skin -stomach pain -sweating -weight loss Side effects that usually do not require medical attention (report to your doctor or health care professional if they continue or are bothersome): -decreased appetite -diarrhea -tiredness This list may not describe all possible side effects. Call your doctor for medical advice about side effects. You may report side effects to FDA at 1-800-FDA-1088. Where should I keep my medicine? This drug is given in a hospital or clinic and will not be stored at home. NOTE: This sheet is a summary. It may not cover all possible information. If you have questions about this medicine, talk to your doctor, pharmacist, or health care provider.  2018 Elsevier/Gold Standard (2016-08-05 12:29:36)

## 2017-09-18 ENCOUNTER — Telehealth: Payer: Self-pay | Admitting: Medical Oncology

## 2017-09-18 LAB — TSH: TSH: 0.994 m[IU]/L (ref 0.320–4.118)

## 2017-09-18 NOTE — Telephone Encounter (Signed)
Pt doing well after Keytruda. He wants his feeding tube removed. He has not used it in two weeks and he is swallowing solids and liquids and "eating anything I want".

## 2017-09-18 NOTE — Telephone Encounter (Signed)
Ok to order it to be removed.

## 2017-09-19 ENCOUNTER — Other Ambulatory Visit: Payer: Self-pay | Admitting: Internal Medicine

## 2017-09-21 ENCOUNTER — Other Ambulatory Visit: Payer: Self-pay | Admitting: *Deleted

## 2017-09-21 DIAGNOSIS — C3412 Malignant neoplasm of upper lobe, left bronchus or lung: Secondary | ICD-10-CM

## 2017-09-21 NOTE — Progress Notes (Signed)
Order entered for pt feeding tube to be removed.

## 2017-09-22 ENCOUNTER — Telehealth: Payer: Self-pay | Admitting: Radiation Oncology

## 2017-09-22 ENCOUNTER — Other Ambulatory Visit: Payer: Self-pay

## 2017-09-22 ENCOUNTER — Other Ambulatory Visit: Payer: Self-pay | Admitting: Medical Oncology

## 2017-09-22 ENCOUNTER — Encounter: Payer: Self-pay | Admitting: Urology

## 2017-09-22 ENCOUNTER — Ambulatory Visit
Admission: RE | Admit: 2017-09-22 | Discharge: 2017-09-22 | Disposition: A | Discharge status: Home / Self Care | Payer: Managed Care, Other (non HMO) | Source: Ambulatory Visit | Attending: Radiation Oncology | Admitting: Radiation Oncology

## 2017-09-22 VITALS — BP 137/85 | HR 84 | Temp 98.2°F | Resp 8 | Ht 71.0 in | Wt 154.2 lb

## 2017-09-22 DIAGNOSIS — Z803 Family history of malignant neoplasm of breast: Secondary | ICD-10-CM | POA: Insufficient documentation

## 2017-09-22 DIAGNOSIS — J449 Chronic obstructive pulmonary disease, unspecified: Secondary | ICD-10-CM | POA: Diagnosis not present

## 2017-09-22 DIAGNOSIS — Z79899 Other long term (current) drug therapy: Secondary | ICD-10-CM | POA: Diagnosis not present

## 2017-09-22 DIAGNOSIS — Z8249 Family history of ischemic heart disease and other diseases of the circulatory system: Secondary | ICD-10-CM | POA: Insufficient documentation

## 2017-09-22 DIAGNOSIS — K219 Gastro-esophageal reflux disease without esophagitis: Secondary | ICD-10-CM | POA: Insufficient documentation

## 2017-09-22 DIAGNOSIS — Z510 Encounter for antineoplastic radiation therapy: Secondary | ICD-10-CM | POA: Diagnosis present

## 2017-09-22 DIAGNOSIS — C321 Malignant neoplasm of supraglottis: Secondary | ICD-10-CM

## 2017-09-22 DIAGNOSIS — Z808 Family history of malignant neoplasm of other organs or systems: Secondary | ICD-10-CM | POA: Insufficient documentation

## 2017-09-22 DIAGNOSIS — Z7982 Long term (current) use of aspirin: Secondary | ICD-10-CM | POA: Diagnosis not present

## 2017-09-22 DIAGNOSIS — Z8701 Personal history of pneumonia (recurrent): Secondary | ICD-10-CM | POA: Insufficient documentation

## 2017-09-22 DIAGNOSIS — F4321 Adjustment disorder with depressed mood: Secondary | ICD-10-CM | POA: Diagnosis not present

## 2017-09-22 DIAGNOSIS — Z807 Family history of other malignant neoplasms of lymphoid, hematopoietic and related tissues: Secondary | ICD-10-CM | POA: Insufficient documentation

## 2017-09-22 DIAGNOSIS — C3412 Malignant neoplasm of upper lobe, left bronchus or lung: Secondary | ICD-10-CM | POA: Diagnosis not present

## 2017-09-22 DIAGNOSIS — Z8371 Family history of colonic polyps: Secondary | ICD-10-CM | POA: Diagnosis not present

## 2017-09-22 DIAGNOSIS — F419 Anxiety disorder, unspecified: Secondary | ICD-10-CM | POA: Diagnosis not present

## 2017-09-22 DIAGNOSIS — I10 Essential (primary) hypertension: Secondary | ICD-10-CM | POA: Insufficient documentation

## 2017-09-22 DIAGNOSIS — Z800 Family history of malignant neoplasm of digestive organs: Secondary | ICD-10-CM | POA: Diagnosis not present

## 2017-09-22 DIAGNOSIS — F17210 Nicotine dependence, cigarettes, uncomplicated: Secondary | ICD-10-CM | POA: Diagnosis not present

## 2017-09-22 NOTE — Telephone Encounter (Signed)
Received voicemail message from patient requesting return call. Phoned patient back. Patient states, "I spoke to someone yesterday about having this feeding tube removed but I haven't heard back from them." Explained Abelina Bachelor, RN for Dr. Julien Nordmann placed an order this morning at 0900 to have interventional radiology remove the tube. Explained IR should reach out to him to schedule in the very near future. Patient verbalized understanding and expressed appreciation for the return call.

## 2017-09-22 NOTE — Telephone Encounter (Signed)
IR order sent to remove feeding tube.

## 2017-09-22 NOTE — Addendum Note (Signed)
Encounter addended by: Malena Edman, RN on: 09/22/2017 4:09 PM  Actions taken: Charge Capture section accepted

## 2017-09-22 NOTE — Progress Notes (Signed)
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  Interval Since Last Radiation:  5 weeks  Curative concurrent chemoradiation 06/30/2017 to 08/18/2017:    1. The lung and larynx were treated to 44 Gy in 22 fractions of 2 Gy. 2. The targets were boosted to 22 Gy in 11 fractions of 2 Gy.   Narrative:  The patient returns today for routine follow-up.  He tolerated radiation treatment relatively well.  He developed brisk erythema to the neck with throat and neck irritation and difficulty with swallowing. He required PEG placement on 08/10/17 for nutrition and tolerated this well.  He reported productive cough with clear phlegm and shortness of breath with activity but denied nausea or vomiting.  He had a recent repeat PET scan for post treatment evaluation which reveals an overall good response to treatment. There is interval decrease in size of the left suprahilar mass and left supraclavicular lymph node. However, there is a new small hypermetabolic right hilar lymph node but no evidence of distant disease progression.  He has recently started immunotherapy with Keytruda, first dose was given 09/17/2017. He reports that he tolerated this very well.  He and his wife are preparing to leave for a 1 week vacation in [place] to celebrate his upcoming birthday.  On review of systems, the patient states that he is doing well overall. He reports resolution of the dysphagia and is able to eat and drink without difficulty at this point. He is scheduled to have his PEG  tube removed in the near future. He continues to have hoarseness of his voice. He denies any residual skin irritation in the neck or chest region. He denies chest pain, productive cough, hemoptysis, increased shortness of breath, fever, chills or night sweats. He reports a healthy appetite and is maintaining his weight. In fact he has actually gained approximately 7 pounds since completing treatment. He is very pleased with his progress.  He denies abdominal pain, nausea, vomiting or diarrhea but reports occasional constipation.  Recent TSH was WNL at 0.994 on 09/17/17.    ALLERGIES:  has No Known Allergies.  Meds: Current Outpatient Medications  Medication Sig Dispense Refill  . BREO ELLIPTA 100-25 MCG/INH AEPB INHALE 1 PUFF INTO THE LUNGS DAILY 60 each 11  . Cholecalciferol (VITAMIN D PO) Take 1 tablet by mouth daily.    . clonazePAM (KLONOPIN) 0.5 MG tablet Take 1 tablet (0.5 mg total) by mouth 2 (two) times daily as needed for anxiety. 180 tablet 0  . docusate sodium (COLACE) 100 MG capsule Take 100 mg daily by mouth.    . esomeprazole (NEXIUM) 40 MG capsule TAKE 1 CAPSULE BY MOUTH DAILY WITH BREAKFAST 90 capsule 1  . lidocaine-prilocaine (EMLA) cream Apply 1 application topically as needed. 30 g 1  . loratadine (CLARITIN) 10 MG tablet Take 10 mg by mouth daily as needed for allergies.    . fentaNYL (DURAGESIC - DOSED MCG/HR) 75 MCG/HR Place 1 patch (75 mcg total) onto the skin every 3 (three) days. (Patient not taking: Reported on 09/22/2017)  10 patch 0  . HYDROmorphone (DILAUDID) 4 MG tablet Place 1 tablet (4 mg total) into feeding tube every 6 (six) hours as needed for severe pain. (Patient not taking: Reported on 09/22/2017) 15 tablet 0  . lidocaine (XYLOCAINE) 2 % solution 5 ml po q 8 hours as needed for mouth and throat pain (Patient not taking: Reported on 09/07/2017) 100 mL 1  . nicotine (NICODERM CQ) 21 mg/24hr patch Place 1 patch (21 mg total) onto the skin daily. (Patient not taking:  Reported on 09/07/2017) 28 patch 0  . prochlorperazine (COMPAZINE) 10 MG tablet Take 1 tablet (10 mg total) by mouth every 6 (six) hours as needed for nausea or vomiting. (Patient not taking: Reported on 09/22/2017) 30 tablet 0  . sennosides (SENOKOT) 8.8 MG/5ML syrup Place 5 mLs into feeding tube at bedtime. (Patient not taking: Reported on 09/22/2017) 240 mL 0   No current facility-administered medications for this encounter.     Physical Findings:  height is 5\' 11"  (1.803 m) and weight is 154 lb 3.2 oz (69.9 kg). His oral temperature is 98.2 F (36.8 C). His blood pressure is 137/85 and his pulse is 84. His respiration is 8 (abnormal) and oxygen saturation is 100%.  Pain Assessment Pain Score: 0-No pain/10 In general this is a well appearing caucasian male in no acute distress. He's alert and oriented x4 and appropriate throughout the examination. Cardiopulmonary assessment is negative for acute distress and he exhibits normal effort. There is mild residual hyperpigmentation without desquamation in the treatment area over the neck and upper chest.   Lab Findings: Lab Results  Component Value Date   WBC 6.8 09/17/2017   HGB 11.6 (L) 09/17/2017   HCT 36.3 (L) 09/17/2017   MCV 97.8 09/17/2017   PLT 290 09/17/2017     Radiographic Findings: Nm Pet Image Restag (ps) Skull Base To Thigh  Result Date: 09/02/2017 CLINICAL DATA:  Subsequent treatment strategy for LEFT upper lobe lung cancer. EXAM: NUCLEAR MEDICINE PET SKULL BASE TO THIGH TECHNIQUE: 9.1 mCi F-18 FDG was injected intravenously. Full-ring PET imaging was performed from the skull base to thigh after the radiotracer. CT data was obtained and used for attenuation correction and anatomic localization. FASTING BLOOD GLUCOSE:  Value: 101 mg/dl COMPARISON:  PET-CT 06/10/2017 FINDINGS: NECK A LEFT supraclavicular node is slightly decreased in size and metabolic activity measuring 11 mm short axis with SUV max equal 7.2 compared with 15  mm node with SUV max equal 13.1. CHEST Hypermetabolic LEFT suprahilar mass extending into the AP window is decreased in size measuring 33 x 38 mm compared to 38 x 61 mm; however the lesion remains intensely hypermetabolic with SUV max equal 15.9 compared to 15.6. There is resolution of a high LEFT paratracheal lymph node. However, there is a new hypermetabolic small RIGHT hilar lymph node with SUV max equal 7.6. This node is difficult to identify on the noncontrast CT and measure approximate 11 mm on image 80, series 4 ABDOMEN/PELVIS Interval placement of percutaneous gastrostomy tube with metabolic activity associated with the track through the muscles which is likely inflammatory. No hypermetabolic activity liver. Normal adrenal glands. No hypermetabolic abdominopelvic lymph nodes. SKELETON No focal hypermetabolic activity to suggest skeletal metastasis. IMPRESSION: 1. Interval decrease in size of LEFT suprahilar mass with persistent intense metabolic activity. 2. Interval decrease in size and metabolic activity of LEFT supraclavicular lymph node. 3. New hypermetabolic small RIGHT hilar lymph node. 4. No evidence of distant disease progression. Electronically Signed  By: Suzy Bouchard M.D.   On: 09/02/2017 15:25    Impression/Plan: 1. 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    He has recovered well from the effects of radiotherapy. He anticipates having his PEG tube removed in the near future. Recent restaging PET scan indicates a good response to treatment. He has recently started immunotherapy with Keytruda under the care and direction of Dr. Julien Nordmann and has a scheduled follow-up with Dr. Julien Nordmann on 10/29/2017.  He will continue with routine disease surveillance with serial CT imaging of  the chest under the direction of Dr. Julien Nordmann. He is comfortable with this plan. He knows to call with any questions or concerns related to his prior radiotherapy. 2. 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 Based on current NCCN guidelines, we will plan to see him back in the office every 3 months for fibreroptic nasopharyngoscopy for the first year, then every 6 months for years 2-5 and annually thereafter. The patient is unsure whether he has any planned follow-up with Dr. Redmond Baseman, ENT. I will follow-up and if Dr. Redmond Baseman is indeed planning to continue to follow the patient, we are happy to alternate the 3 month visits for fiberoptic nasopharyngoscopy with him.  Recent TSH was WNL at 0.994 on 09/17/17.  We continue to follow his TSH annually he is at an increased risk for hypothyroidism status-post neck irradiation.      Nicholos Johns, PA-C

## 2017-09-23 ENCOUNTER — Telehealth: Payer: Self-pay | Admitting: *Deleted

## 2017-09-23 NOTE — Telephone Encounter (Signed)
CALLED PATIENT TO INFORM OF FU APPT. WITH ASHLYN Pottsgrove ON 11-26-17 @ 10:30 AM, SPOKE WITH PATIENT AND HE IS AWARE OF THIS APPT.

## 2017-09-29 ENCOUNTER — Ambulatory Visit (HOSPITAL_COMMUNITY)
Admission: RE | Admit: 2017-09-29 | Discharge: 2017-09-29 | Disposition: A | Discharge status: Home / Self Care | Payer: Managed Care, Other (non HMO) | Source: Ambulatory Visit | Attending: Internal Medicine | Admitting: Internal Medicine

## 2017-09-29 ENCOUNTER — Encounter (HOSPITAL_COMMUNITY): Payer: Self-pay | Admitting: Radiology

## 2017-09-29 DIAGNOSIS — Z431 Encounter for attention to gastrostomy: Secondary | ICD-10-CM | POA: Insufficient documentation

## 2017-09-29 DIAGNOSIS — Z85118 Personal history of other malignant neoplasm of bronchus and lung: Secondary | ICD-10-CM | POA: Insufficient documentation

## 2017-09-29 DIAGNOSIS — C321 Malignant neoplasm of supraglottis: Secondary | ICD-10-CM

## 2017-09-29 HISTORY — PX: IR GASTROSTOMY TUBE REMOVAL: IMG5492

## 2017-09-29 MED ORDER — LIDOCAINE VISCOUS 2 % MT SOLN
OROMUCOSAL | Status: AC
  Administered 2017-09-29: 15 mL
  Filled 2017-09-29: qty 15

## 2017-09-29 NOTE — Procedures (Signed)
Patient's 64 French pull-through gastrostomy tube was removed in its entirety without immediate complications.  Gauze dressing applied over site.  Medication used-viscous lidocaine into gastrostomy tube insertion site tract.  Site care instructions were reviewed with patient.

## 2017-10-05 ENCOUNTER — Telehealth: Payer: Self-pay | Admitting: *Deleted

## 2017-10-05 NOTE — Telephone Encounter (Signed)
Oncology Nurse [facility] to coordinate post-RT follow-up with Dr. Redmond Baseman mid-April 2019.  Spoke with Freda Munro, provided information, she asked I call again next month when his 2019 schedule available.  Gayleen Orem, RN, BSN, [facility] Nurse [facility] [phone]

## 2017-10-08 ENCOUNTER — Ambulatory Visit (HOSPITAL_BASED_OUTPATIENT_CLINIC_OR_DEPARTMENT_OTHER): Payer: Managed Care, Other (non HMO) | Admitting: Oncology

## 2017-10-08 ENCOUNTER — Encounter: Payer: Self-pay | Admitting: Oncology

## 2017-10-08 ENCOUNTER — Ambulatory Visit: Payer: Managed Care, Other (non HMO) | Admitting: Nutrition

## 2017-10-08 ENCOUNTER — Ambulatory Visit (HOSPITAL_BASED_OUTPATIENT_CLINIC_OR_DEPARTMENT_OTHER): Payer: Managed Care, Other (non HMO)

## 2017-10-08 ENCOUNTER — Ambulatory Visit: Payer: Managed Care, Other (non HMO)

## 2017-10-08 ENCOUNTER — Other Ambulatory Visit: Payer: Managed Care, Other (non HMO)

## 2017-10-08 ENCOUNTER — Other Ambulatory Visit (HOSPITAL_BASED_OUTPATIENT_CLINIC_OR_DEPARTMENT_OTHER): Payer: Managed Care, Other (non HMO)

## 2017-10-08 VITALS — BP 142/77 | HR 82 | Temp 98.7°F | Resp 18 | Ht 71.0 in | Wt 154.9 lb

## 2017-10-08 DIAGNOSIS — Z79899 Other long term (current) drug therapy: Secondary | ICD-10-CM

## 2017-10-08 DIAGNOSIS — C3412 Malignant neoplasm of upper lobe, left bronchus or lung: Secondary | ICD-10-CM

## 2017-10-08 DIAGNOSIS — Z5112 Encounter for antineoplastic immunotherapy: Secondary | ICD-10-CM | POA: Insufficient documentation

## 2017-10-08 DIAGNOSIS — E876 Hypokalemia: Secondary | ICD-10-CM | POA: Diagnosis not present

## 2017-10-08 DIAGNOSIS — C321 Malignant neoplasm of supraglottis: Secondary | ICD-10-CM | POA: Diagnosis not present

## 2017-10-08 DIAGNOSIS — R5382 Chronic fatigue, unspecified: Secondary | ICD-10-CM

## 2017-10-08 LAB — CBC WITH DIFFERENTIAL/PLATELET
BASO%: 0.2 % (ref 0.0–2.0)
Basophils Absolute: 0 10*3/uL (ref 0.0–0.1)
EOS ABS: 0.1 10*3/uL (ref 0.0–0.5)
EOS%: 2.1 % (ref 0.0–7.0)
HCT: 37.1 % — ABNORMAL LOW (ref 38.4–49.9)
HGB: 12 g/dL — ABNORMAL LOW (ref 13.0–17.1)
LYMPH%: 8.5 % — AB (ref 14.0–49.0)
MCH: 31.1 pg (ref 27.2–33.4)
MCHC: 32.3 g/dL (ref 32.0–36.0)
MCV: 96.1 fL (ref 79.3–98.0)
MONO#: 0.4 10*3/uL (ref 0.1–0.9)
MONO%: 7.7 % (ref 0.0–14.0)
NEUT#: 4.3 10*3/uL (ref 1.5–6.5)
NEUT%: 81.5 % — AB (ref 39.0–75.0)
Platelets: 268 10*3/uL (ref 140–400)
RBC: 3.86 10*6/uL — AB (ref 4.20–5.82)
RDW: 13.5 % (ref 11.0–14.6)
WBC: 5.3 10*3/uL (ref 4.0–10.3)
lymph#: 0.5 10*3/uL — ABNORMAL LOW (ref 0.9–3.3)

## 2017-10-08 LAB — COMPREHENSIVE METABOLIC PANEL
ALBUMIN: 3.3 g/dL — AB (ref 3.5–5.0)
ALK PHOS: 81 U/L (ref 40–150)
ALT: 11 U/L (ref 0–55)
AST: 11 U/L (ref 5–34)
Anion Gap: 10 mEq/L (ref 3–11)
BUN: 7.7 mg/dL (ref 7.0–26.0)
CALCIUM: 9.3 mg/dL (ref 8.4–10.4)
CO2: 28 mEq/L (ref 22–29)
CREATININE: 0.7 mg/dL (ref 0.7–1.3)
Chloride: 106 mEq/L (ref 98–109)
EGFR: >60 mL/min/{1.73_m2} (ref >60)
Glucose: 109 mg/dl (ref 70–140)
Potassium: 3 mEq/L — CL (ref 3.5–5.1)
Sodium: 143 mEq/L (ref 136–145)
Total Bilirubin: 0.24 mg/dL (ref 0.20–1.20)
Total Protein: 6.1 g/dL — ABNORMAL LOW (ref 6.4–8.3)

## 2017-10-08 LAB — TSH: TSH: 1.698 m[IU]/L (ref 0.320–4.118)

## 2017-10-08 MED ORDER — SODIUM CHLORIDE 0.9 % IV SOLN
Freq: Once | INTRAVENOUS | Status: AC
  Administered 2017-10-08: 12:00:00 via INTRAVENOUS

## 2017-10-08 MED ORDER — POTASSIUM CHLORIDE CRYS ER 20 MEQ PO TBCR: EXTENDED_RELEASE_TABLET | ORAL | 0 refills | Status: DC

## 2017-10-08 MED ORDER — SODIUM CHLORIDE 0.9% FLUSH
10.0000 mL | INTRAVENOUS | Status: DC | PRN
  Administered 2017-10-08: 10 mL
  Filled 2017-10-08: qty 10

## 2017-10-08 MED ORDER — SODIUM CHLORIDE 0.9 % IV SOLN
200.0000 mg | Freq: Once | INTRAVENOUS | Status: AC
  Administered 2017-10-08: 200 mg via INTRAVENOUS
  Filled 2017-10-08: qty 8

## 2017-10-08 MED ORDER — HEPARIN SOD (PORK) LOCK FLUSH 100 UNIT/ML IV SOLN
500.0000 [IU] | Freq: Once | INTRAVENOUS | Status: AC | PRN
  Administered 2017-10-08: 500 [IU]
  Filled 2017-10-08: qty 5

## 2017-10-08 MED FILL — POTASSIUM CL ER 20 MEQ TAB: 20 | 10 days supply | Qty: 10 | Fill #0

## 2017-10-08 NOTE — Patient Instructions (Signed)
Implanted Port Home Guide An implanted port is a type of central line that is placed under the skin. Central lines are used to provide IV access when treatment or nutrition needs to be given through a person's veins. Implanted ports are used for long-term IV access. An implanted port may be placed because:  You need IV medicine that would be irritating to the small veins in your hands or arms.  You need long-term IV medicines, such as antibiotics.  You need IV nutrition for a long period.  You need frequent blood draws for lab tests.  You need dialysis.  Implanted ports are usually placed in the chest area, but they can also be placed in the upper arm, the abdomen, or the leg. An implanted port has two main parts:  Reservoir. The reservoir is round and will appear as a small, raised area under your skin. The reservoir is the part where a needle is inserted to give medicines or draw blood.  Catheter. The catheter is a thin, flexible tube that extends from the reservoir. The catheter is placed into a large vein. Medicine that is inserted into the reservoir goes into the catheter and then into the vein.  How will I care for my incision site? Do not get the incision site wet. Bathe or shower as directed by your health care provider. How is my port accessed? Special steps must be taken to access the port:  Before the port is accessed, a numbing cream can be placed on the skin. This helps numb the skin over the port site.  Your health care provider uses a sterile technique to access the port. ? Your health care provider must put on a mask and sterile gloves. ? The skin over your port is cleaned carefully with an antiseptic and allowed to dry. ? The port is gently pinched between sterile gloves, and a needle is inserted into the port.  Only "non-coring" port needles should be used to access the port. Once the port is accessed, a blood return should be checked. This helps ensure that the port  is in the vein and is not clogged.  If your port needs to remain accessed for a constant infusion, a clear (transparent) bandage will be placed over the needle site. The bandage and needle will need to be changed every week, or as directed by your health care provider.  Keep the bandage covering the needle clean and dry. Do not get it wet. Follow your health care provider's instructions on how to take a shower or bath while the port is accessed.  If your port does not need to stay accessed, no bandage is needed over the port.  What is flushing? Flushing helps keep the port from getting clogged. Follow your health care provider's instructions on how and when to flush the port. Ports are usually flushed with saline solution or a medicine called heparin. The need for flushing will depend on how the port is used.  If the port is used for intermittent medicines or blood draws, the port will need to be flushed: ? After medicines have been given. ? After blood has been drawn. ? As part of routine maintenance.  If a constant infusion is running, the port may not need to be flushed.  How long will my port stay implanted? The port can stay in for as long as your health care provider thinks it is needed. When it is time for the port to come out, surgery will be   done to remove it. The procedure is similar to the one performed when the port was put in. When should I seek immediate medical care? When you have an implanted port, you should seek immediate medical care if:  You notice a bad smell coming from the incision site.  You have swelling, redness, or drainage at the incision site.  You have more swelling or pain at the port site or the surrounding area.  You have a fever that is not controlled with medicine.  This information is not intended to replace advice given to you by your health care provider. Make sure you discuss any questions you have with your health care provider. Document  Released: 10/27/2005 Document Revised: 04/03/2016 Document Reviewed: 07/04/2013 Elsevier Interactive Patient Education  2017 Elsevier Inc.  

## 2017-10-08 NOTE — Assessment & Plan Note (Signed)
Patient's potassium level is 3.0 today.  A prescription for K-Dur 20 mEq daily times 10 days was sent to his pharmacy.  He was also given a list of potassium rich foods.  Repeat potassium level at his next visit.

## 2017-10-08 NOTE — Progress Notes (Signed)
Nutrition follow-up completed with patient receiving chemotherapy for non-small cell lung cancer/glottis cancer. Weight improved documented 154.9 pounds November 29 is increased from 150.6 pounds. Labs noted: Potassium 3.0, and albumin 3.3. Patient reports his feeding tube was removed and he is now heart murmur every no longer using tube feeding. He denies nutrition impact symptoms. Reports he is eating well and is pleased with his weight gain. Reports he is trying to drink more water.  Nutrition diagnosis: Inadequate oral intake has improved/resolved.  Educated patient to consume oral nutrition supplements such as boost as needed for weight maintenance/weight gain. Provided coupons. Reviewed importance of increased fluids. Questions were answered.  Teach back method used. Patient will continue adequate oral intake to promote weight gain./Weight maintenance. No follow-up scheduled at this time.  Patient has contact information if needed.  **Disclaimer: This note was dictated with voice recognition software. Similar sounding words can inadvertently be transcribed and this note may contain transcription errors which may not have been corrected upon publication of note.**

## 2017-10-08 NOTE — Patient Instructions (Signed)
You can use Hydrocortisone cream for redness and itching. This is over the counter.    Hypokalemia Hypokalemia means that the amount of potassium in the blood is lower than normal.Potassium is a chemical that helps regulate the amount of fluid in the body (electrolyte). It also stimulates muscle tightening (contraction) and helps nerves work properly.Normally, most of the body's potassium is inside of cells, and only a very small amount is in the blood. Because the amount in the blood is so small, minor changes to potassium levels in the blood can be life-threatening. What are the causes? This condition may be caused by:  Antibiotic medicine.  Diarrhea or vomiting. Taking too much of a medicine that helps you have a bowel movement (laxative) can cause diarrhea and lead to hypokalemia.  Chronic kidney disease (CKD).  Medicines that help the body get rid of excess fluid (diuretics).  Eating disorders, such as bulimia.  Low magnesium levels in the body.  Sweating a lot.  What are the signs or symptoms? Symptoms of this condition include:  Weakness.  Constipation.  Fatigue.  Muscle cramps.  Mental confusion.  Skipped heartbeats or irregular heartbeat (palpitations).  Tingling or numbness.  How is this diagnosed? This condition is diagnosed with a blood test. How is this treated? Hypokalemia can be treated by taking potassium supplements by mouth or adjusting the medicines that you take. Treatment may also include eating more foods that contain a lot of potassium. If your potassium level is very low, you may need to get potassium through an IV tube in one of your veins and be monitored in the hospital. Follow these instructions at home:  Take over-the-counter and prescription medicines only as told by your health care provider. This includes vitamins and supplements.  Eat a healthy diet. A healthy diet includes fresh fruits and vegetables, whole grains, healthy fats, and  lean proteins.  If instructed, eat more foods that contain a lot of potassium, such as: ? Nuts, such as peanuts and pistachios. ? Seeds, such as sunflower seeds and pumpkin seeds. ? Peas, lentils, and lima beans. ? Whole grain and bran cereals and breads. ? Fresh fruits and vegetables, such as apricots, avocado, bananas, cantaloupe, kiwi, oranges, tomatoes, asparagus, and potatoes. ? Orange juice. ? Tomato juice. ? Red meats. ? Yogurt.  Keep all follow-up visits as told by your health care provider. This is important. Contact a health care provider if:  You have weakness that gets worse.  You feel your heart pounding or racing.  You vomit.  You have diarrhea.  You have diabetes (diabetes mellitus) and you have trouble keeping your blood sugar (glucose) in your target range. Get help right away if:  You have chest pain.  You have shortness of breath.  You have vomiting or diarrhea that lasts for more than 2 days.  You faint. This information is not intended to replace advice given to you by your health care provider. Make sure you discuss any questions you have with your health care provider. Document Released: 10/27/2005 Document Revised: 06/14/2016 Document Reviewed: 06/14/2016 Elsevier Interactive Patient Education  2018 [employer].

## 2017-10-08 NOTE — Assessment & Plan Note (Signed)
This is a very pleasant 52 year old white male with recently diagnosed stage IIIB/IV non-small cell lung cancer, adenocarcinoma presented with large left hilar mass in addition to mediastinal and left supraclavicular lymphadenopathy as well as suspicious right upper lobe pulmonary nodule diagnosed in August 2018. 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 He underwent a course of concurrent chemoradiation to the lung as well as the epiglottic area under the care of Dr. Tammi Klippel. He is status post 6 cycles. He tolerated this course of treatment well except for the radiation induced esophagitis as well as weight loss and fatigue. The patient is currently on treatment with Keytruda 200 mg IV every 3 weeks.  He tolerated the first cycle well overall except for itching to his forehead.  He has no rashes noted.  Recommend that he proceed with cycle 2 as scheduled today.  We discussed using moisturizer to his face and that he may use Claritin or Zyrtec to help with the itching.  He may also use hydrocortisone cream over-the-counter if itching is not controlled with the above methods.  The patient will return in 3 weeks for evaluation prior to cycle 3 of his Keytruda.  For his lower extremity edema, I have encouraged him to elevate his legs as much as possible.  We also discussed use of compression stockings and reduction of salt intake.  He was advised to call immediately if he has any concerning symptoms in the interval. The patient voices understanding of current disease status and treatment options and is in agreement with the current care plan. All questions were answered. The patient knows to call the clinic with any problems, questions or concerns. We can certainly see the patient much sooner if necessary.

## 2017-10-08 NOTE — Progress Notes (Signed)
[facility] OFFICE PROGRESS NOTE  [name], [name], MD [place] [address]  DIAGNOSIS:  1) Stage IIB/IV (T3, N3, M1a) non-small cell lung cancer, squamous cell carcinoma presented with large left hilar mass in addition to mediastinal and left supraclavicular lymphadenopathy as well as contralateral right upper lobe nodule diagnosed in August 2018. PDL 1 expression: 90%. 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 First dose 06/29/2017. Status post 6 cycles. Last dose was given 08/03/2017.  CURRENT THERAPY: Immunotherapy with Ketruda 200 MG IV every 3 weeks, first dose 09/17/2017.  INTERVAL HISTORY: Trevor Zavala 51 y.o. male returns for routine follow-up visit by himself.  The patient is feeling fine today and has no specific complaints except for itching to his forehead and intermittent lower extremity edema.  He tolerated his first cycle of Keytruda well.  The patient denies fevers and chills.  Denies chest pain, shortness of breath, cough, hemoptysis.  Denies nausea, vomiting, constipation, diarrhea.  The patient had his feeding tube removed since his last visit.  He is eating by mouth without any difficulty.  He is gaining back some of his lost weight.  The patient is here for evaluation prior to cycle 2 of his treatment.  MEDICAL HISTORY: Past Medical History:  Diagnosis Date  . Anxiety   . COPD (chronic obstructive pulmonary disease) ([place])   . GERD (gastroesophageal reflux disease)   . Headache   . Hypertension   . Pneumonia   . Situational depression   . Stage III squamous cell carcinoma of left lung ([place]) 06/19/2017    ALLERGIES:  has No Known Allergies.  MEDICATIONS:  Current Outpatient Medications  Medication Sig Dispense Refill  . BREO ELLIPTA 100-25 MCG/INH AEPB INHALE 1 PUFF INTO THE LUNGS DAILY 60 each 11   . Cholecalciferol (VITAMIN D PO) Take 1 tablet by mouth daily.    . clonazePAM (KLONOPIN) 0.5 MG tablet Take 1 tablet (0.5 mg total) by mouth 2 (two) times daily as needed for anxiety. 180 tablet 0  . esomeprazole (NEXIUM) 40 MG capsule TAKE 1 CAPSULE BY MOUTH DAILY WITH BREAKFAST 90 capsule 1  . loratadine (CLARITIN) 10 MG tablet Take 10 mg by mouth daily as needed for allergies.    . nicotine (NICODERM CQ) 21 mg/24hr patch Place 1 patch (21 mg total) onto the skin daily. 28 patch 0  . docusate sodium (COLACE) 100 MG capsule Take 100 mg daily by mouth.    . fentaNYL (DURAGESIC - DOSED MCG/HR) 75 MCG/HR Place 1 patch (75 mcg total) onto the skin every 3 (three) days. (Patient not taking: Reported on 09/22/2017) 10 patch 0  . HYDROmorphone (DILAUDID) 4 MG tablet Place 1 tablet (4 mg total) into feeding tube every 6 (six) hours as needed for severe pain. (Patient not taking: Reported on 09/22/2017) 15 tablet 0  . lidocaine (XYLOCAINE) 2 % solution 5 ml po q 8 hours as needed for mouth and throat pain (Patient not taking: Reported on 09/07/2017) 100 mL 1  . lidocaine-prilocaine (EMLA) cream Apply 1 application topically as needed. 30 g 1  . potassium chloride SA (K-DUR,KLOR-CON) 20 MEQ tablet Take 1 tablet daily for 10 days. 10 tablet 0  . prochlorperazine (COMPAZINE) 10 MG tablet Take 1 tablet (10 mg total) by mouth every 6 (six) hours as needed for nausea or vomiting. (Patient not taking: Reported  on 09/22/2017) 30 tablet 0  . sennosides (SENOKOT) 8.8 MG/5ML syrup Place 5 mLs into feeding tube at bedtime. (Patient not taking: Reported on 09/22/2017) 240 mL 0   No current facility-administered medications for this visit.    Facility-Administered Medications Ordered in Other Visits  Medication Dose Route Frequency Provider Last Rate Last Dose  . heparin lock flush 100 unit/mL  500 Units Intracatheter Once PRN Curt Bears, MD      . pembrolizumab [facility]) 200 mg in sodium chloride 0.9 % 50 mL  chemo infusion  200 mg Intravenous Once Curt Bears, MD 116 mL/hr at 10/08/17 1246 200 mg at 10/08/17 1246  . sodium chloride flush (NS) 0.9 % injection 10 mL  10 mL Intracatheter PRN Curt Bears, MD        SURGICAL HISTORY:  Past Surgical History:  Procedure Laterality Date  . COLONOSCOPY    . DIRECT LARYNGOSCOPY N/A 06/25/2017   Procedure: DIRECT LARYNGOSCOPY AND BIOPSY;  Surgeon: Rozetta Nunnery, MD;  Location: [facility];  Service: ENT;  Laterality: N/A;  . EAR CYST EXCISION N/A 11/17/2013   Procedure: SEBACEOUS CYST CHEST;  Surgeon: Joyice Faster. Cornett, MD;  Location: [facility];  Service: General;  Laterality: N/A;  . IR GASTROSTOMY TUBE MOD SED  08/10/2017  . IR GASTROSTOMY TUBE REMOVAL  09/29/2017  . KNEE ARTHROSCOPY     LEFT  . LIPOMA EXCISION N/A 11/17/2013   Procedure: EXCISION LIPOMA FOREHEAD;  Surgeon: Joyice Faster. Cornett, MD;  Location: [facility];  Service: General;  Laterality: N/A;  . LUNG BIOPSY Bilateral 06/12/2017   Procedure: LEFT LUNG BIOPSY;  Surgeon: Grace Isaac, MD;  Location: [facility];  Service: Thoracic;  Laterality: Bilateral;  . PORTACATH PLACEMENT Right 07/01/2017   Procedure: INSERTION PORT-A-CATH - RIGHT IJ - placed with Fluoro and Ultrasound;  Surgeon: Grace Isaac, MD;  Location: [facility];  Service: Thoracic;  Laterality: Right;  Marland Kitchen VIDEO BRONCHOSCOPY WITH ENDOBRONCHIAL ULTRASOUND N/A 06/12/2017   Procedure: VIDEO BRONCHOSCOPY WITH ENDOBRONCHIAL ULTRASOUND;  Surgeon: Grace Isaac, MD;  Location: [facility];  Service: Thoracic;  Laterality: N/A;    REVIEW OF SYSTEMS:   Review of Systems  Constitutional: Negative for appetite change, chills, fatigue, fever and unexpected weight change.  HENT:   Negative for mouth sores, nosebleeds, sore throat and trouble swallowing.   Eyes: Negative for eye problems and icterus.  Respiratory: Negative for cough, hemoptysis, shortness of breath and wheezing.    Cardiovascular: Negative for chest pain. Reports intermittent bilateral lower extremity edema. Gastrointestinal: Negative for abdominal pain, constipation, diarrhea, nausea and vomiting.  Genitourinary: Negative for bladder incontinence, difficulty urinating, dysuria, frequency and hematuria.   Musculoskeletal: Negative for back pain, gait problem, neck pain and neck stiffness.  Skin: Negative for rash. Positive for itching to his forehead. Neurological: Negative for dizziness, extremity weakness, gait problem, headaches, light-headedness and seizures.  Hematological: Negative for adenopathy. Does not bruise/bleed easily.  Psychiatric/Behavioral: Negative for confusion, depression and sleep disturbance. The patient is not nervous/anxious.     PHYSICAL EXAMINATION:  Blood pressure (!) 142/77, pulse 82, temperature 98.7 F (37.1 C), temperature source Oral, resp. rate 18, height 5\' 11"  (1.803 m), weight 154 lb 14.4 oz (70.3 kg), SpO2 96 %.  ECOG PERFORMANCE STATUS: 1 - Symptomatic but completely ambulatory  Physical Exam  Constitutional: Oriented to person, place, and time and well-developed, well-nourished, and in no distress. No distress.  HENT:  Head: Normocephalic and atraumatic.  Mouth/Throat: Oropharynx  is clear and moist. No oropharyngeal exudate.  Eyes: Conjunctivae are normal. Right eye exhibits no discharge. Left eye exhibits no discharge. No scleral icterus.  Neck: Normal range of motion. Neck supple.  Cardiovascular: Normal rate, regular rhythm, normal heart sounds and intact distal pulses.   Pulmonary/Chest: Effort normal and breath sounds normal. No respiratory distress. No wheezes. No rales.  Abdominal: Soft. Bowel sounds are normal. Exhibits no distension and no mass. There is no tenderness.  Musculoskeletal: Normal range of motion. Exhibits no edema.  Lymphadenopathy:    No cervical adenopathy.  Neurological: Alert and oriented to person, place, and time. Exhibits normal  muscle tone. Gait normal. Coordination normal.  Skin: Skin is warm and dry. No rash noted. Not diaphoretic. No erythema. No pallor.  Psychiatric: Mood, memory and judgment normal.  Vitals reviewed.  LABORATORY DATA: Lab Results  Component Value Date   WBC 5.3 10/08/2017   HGB 12.0 (L) 10/08/2017   HCT 37.1 (L) 10/08/2017   MCV 96.1 10/08/2017   PLT 268 10/08/2017      Chemistry      Component Value Date/Time   NA 143 10/08/2017 1001   K 3.0 (LL) 10/08/2017 1001   CL 95 (L) 08/17/2017 0416   CO2 28 10/08/2017 1001   BUN 7.7 10/08/2017 1001   CREATININE 0.7 10/08/2017 1001   GLU 198 08/25/2017      Component Value Date/Time   CALCIUM 9.3 10/08/2017 1001   ALKPHOS 81 10/08/2017 1001   AST 11 10/08/2017 1001   ALT 11 10/08/2017 1001   BILITOT 0.24 10/08/2017 1001       RADIOGRAPHIC STUDIES:  Ir Gastrostomy Tube Removal/repair  Result Date: 09/29/2017 INDICATION: Patient with history of squamous cell carcinoma of left lung and epiglottis, status post gastrostomy tube placement on 08/10/2017; patient is currently eating and request received for gastrostomy tube removal. EXAM: GASTROSTOMY TUBE REMOVAL MEDICATIONS: Viscous lidocaine into gastrostomy tube insertion site tract ANESTHESIA/SEDATION: None CONTRAST:  None FLUOROSCOPY TIME:  None COMPLICATIONS: None immediate. PROCEDURE: Informed consent was obtained from the patient after a thorough discussion of the procedural risks, benefits and alternatives. All questions were addressed. Correct patient and order confirmation performed. Viscous lidocaine was applied into gastrostomy tube insertion site tract. The 15 French gastrostomy tube was removed in its entirety via manual traction without immediate complications. Gauze dressing applied over site. IMPRESSION: Successful removal of 20 French pull-through gastrostomy tube in its entirety without immediate complications. Read by: Rowe Robert, PA-C Electronically Signed   By: Jacqulynn Cadet M.D.   On: 09/29/2017 12:39     ASSESSMENT/PLAN:  Stage III squamous cell carcinoma of left lung [facility]) This is a very pleasant 51 year old white male with recently diagnosed stage IIIB/IV non-small cell lung cancer, adenocarcinoma presented with large left hilar mass in addition to mediastinal and left supraclavicular lymphadenopathy as well as suspicious right upper lobe pulmonary nodule diagnosed in August 2018. 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 He underwent a course of concurrent chemoradiation to the lung as well as the epiglottic area under the care of Dr. Tammi Klippel. He is status post 6 cycles. He tolerated this course of treatment well except for the radiation induced esophagitis as well as weight loss and fatigue. The patient is currently on treatment with Keytruda 200 mg IV every 3 weeks.  He tolerated the first cycle well overall except for itching to his forehead.  He has no rashes noted.  Recommend that  he proceed with cycle 2 as scheduled today.  We discussed using moisturizer to his face and that he may use Claritin or Zyrtec to help with the itching.  He may also use hydrocortisone cream over-the-counter if itching is not controlled with the above methods.  The patient will return in 3 weeks for evaluation prior to cycle 3 of his Keytruda.  For his lower extremity edema, I have encouraged him to elevate his legs as much as possible.  We also discussed use of compression stockings and reduction of salt intake.  He was advised to call immediately if he has any concerning symptoms in the interval. The patient voices understanding of current disease status and treatment options and is in agreement with the current care plan. All questions were answered. The patient knows to call the clinic with any problems, questions or concerns. We can certainly see the patient much sooner if necessary.  Hypokalemia Patient's potassium  level is 3.0 today.  A prescription for K-Dur 20 mEq daily times 10 days was sent to his pharmacy.  He was also given a list of potassium rich foods.  Repeat potassium level at his next visit.  No orders of the defined types were placed in this encounter.   Mikey Bussing, DNP, AGPCNP-BC, AOCNP 10/08/17

## 2017-10-08 NOTE — Progress Notes (Signed)
Per Juliann Pulse, RN per Freida Busman, NP okay to treat today with potassium of 3.0

## 2017-10-08 NOTE — Patient Instructions (Signed)
[facility] Discharge Instructions for Patients Receiving Chemotherapy  Today you received the following chemotherapy agents:  Keytruda.  To help prevent nausea and vomiting after your treatment, we encourage you to take your nausea medication as directed.   If you develop nausea and vomiting that is not controlled by your nausea medication, call the clinic.   BELOW ARE SYMPTOMS THAT SHOULD BE REPORTED IMMEDIATELY:  *FEVER GREATER THAN 100.5 F  *CHILLS WITH OR WITHOUT FEVER  NAUSEA AND VOMITING THAT IS NOT CONTROLLED WITH YOUR NAUSEA MEDICATION  *UNUSUAL SHORTNESS OF BREATH  *UNUSUAL BRUISING OR BLEEDING  TENDERNESS IN MOUTH AND THROAT WITH OR WITHOUT PRESENCE OF ULCERS  *URINARY PROBLEMS  *BOWEL PROBLEMS  UNUSUAL RASH Items with * indicate a potential emergency and should be followed up as soon as possible.  Feel free to call the clinic should you have any questions or concerns. The clinic phone number is [phone].  Please show the CHEMO ALERT CARD at check-in to the Emergency Department and triage nurse.    

## 2017-10-10 ENCOUNTER — Telehealth: Payer: Self-pay | Admitting: Oncology

## 2017-10-10 NOTE — Telephone Encounter (Signed)
Scheduled appt per 11/29 los - Patient to get an updated schedule next visit.

## 2017-10-14 ENCOUNTER — Telehealth: Payer: Self-pay | Admitting: Internal Medicine

## 2017-10-14 NOTE — Telephone Encounter (Signed)
He can try Nicoderm patches or Nicorett - OTC. Another option is Chantix Thx

## 2017-10-14 NOTE — Telephone Encounter (Signed)
Copied from [place]. Topic: Quick Communication - See Telephone Encounter >> Oct 14, 2017  8:56 AM Bea Graff, NT wrote: CRM for notification. See Telephone encounter for: [facility] asked this pt to call and let Dr. Alain Marion that he is going through cancer treatment for lung cancer. Wife would like to know if something can be prescribed to help him quit smoking. She states he has been through chemo and radiation and still can't seem to kick the habit.   10/14/17.

## 2017-10-15 NOTE — Telephone Encounter (Signed)
Wife return call back she states husband would rather have the Chantix. He has already tried the Nicoderm patches & gum did not help. Requesting Chantix to be sent to [facility] in [place]...Trevor Zavala

## 2017-10-15 NOTE — Telephone Encounter (Signed)
Called pt no answer LMOM RTC.../lmb 

## 2017-10-16 MED ORDER — VARENICLINE TARTRATE 0.5 MG X 11 & 1 MG X 42 PO MISC: ORAL | 0 refills | Status: DC

## 2017-10-16 NOTE — Telephone Encounter (Signed)
Ok Thx 

## 2017-10-16 NOTE — Addendum Note (Signed)
Addended by: Cassandria Anger on: 10/16/2017 07:15 AM   Modules accepted: Orders

## 2017-10-29 ENCOUNTER — Encounter: Payer: Self-pay | Admitting: Internal Medicine

## 2017-10-29 ENCOUNTER — Ambulatory Visit: Payer: Managed Care, Other (non HMO)

## 2017-10-29 ENCOUNTER — Ambulatory Visit (HOSPITAL_BASED_OUTPATIENT_CLINIC_OR_DEPARTMENT_OTHER): Payer: Managed Care, Other (non HMO) | Admitting: Internal Medicine

## 2017-10-29 ENCOUNTER — Other Ambulatory Visit: Payer: Managed Care, Other (non HMO)

## 2017-10-29 ENCOUNTER — Telehealth: Payer: Self-pay | Admitting: Internal Medicine

## 2017-10-29 ENCOUNTER — Ambulatory Visit (HOSPITAL_BASED_OUTPATIENT_CLINIC_OR_DEPARTMENT_OTHER): Payer: Managed Care, Other (non HMO)

## 2017-10-29 ENCOUNTER — Other Ambulatory Visit (HOSPITAL_BASED_OUTPATIENT_CLINIC_OR_DEPARTMENT_OTHER): Payer: Managed Care, Other (non HMO)

## 2017-10-29 DIAGNOSIS — R5382 Chronic fatigue, unspecified: Secondary | ICD-10-CM

## 2017-10-29 DIAGNOSIS — C321 Malignant neoplasm of supraglottis: Secondary | ICD-10-CM

## 2017-10-29 DIAGNOSIS — Z5112 Encounter for antineoplastic immunotherapy: Secondary | ICD-10-CM | POA: Diagnosis not present

## 2017-10-29 DIAGNOSIS — Z79899 Other long term (current) drug therapy: Secondary | ICD-10-CM | POA: Diagnosis not present

## 2017-10-29 DIAGNOSIS — C3412 Malignant neoplasm of upper lobe, left bronchus or lung: Secondary | ICD-10-CM | POA: Diagnosis not present

## 2017-10-29 DIAGNOSIS — Z95828 Presence of other vascular implants and grafts: Secondary | ICD-10-CM

## 2017-10-29 DIAGNOSIS — C3490 Malignant neoplasm of unspecified part of unspecified bronchus or lung: Secondary | ICD-10-CM

## 2017-10-29 LAB — CBC WITH DIFFERENTIAL/PLATELET
BASO%: 0.6 % (ref 0.0–2.0)
Basophils Absolute: 0 10*3/uL (ref 0.0–0.1)
EOS ABS: 0.1 10*3/uL (ref 0.0–0.5)
EOS%: 1.5 % (ref 0.0–7.0)
HCT: 40.4 % (ref 38.4–49.9)
HGB: 13.5 g/dL (ref 13.0–17.1)
LYMPH%: 10.4 % — AB (ref 14.0–49.0)
MCH: 30.9 pg (ref 27.2–33.4)
MCHC: 33.4 g/dL (ref 32.0–36.0)
MCV: 92.6 fL (ref 79.3–98.0)
MONO#: 0.3 10*3/uL (ref 0.1–0.9)
MONO%: 7.8 % (ref 0.0–14.0)
NEUT#: 3.5 10*3/uL (ref 1.5–6.5)
NEUT%: 79.7 % — AB (ref 39.0–75.0)
PLATELETS: 226 10*3/uL (ref 140–400)
RBC: 4.37 10*6/uL (ref 4.20–5.82)
RDW: 13.3 % (ref 11.0–14.6)
WBC: 4.4 10*3/uL (ref 4.0–10.3)
lymph#: 0.5 10*3/uL — ABNORMAL LOW (ref 0.9–3.3)

## 2017-10-29 LAB — COMPREHENSIVE METABOLIC PANEL
ALT: 8 U/L (ref 0–55)
ANION GAP: 9 meq/L (ref 3–11)
AST: 10 U/L (ref 5–34)
Albumin: 3.5 g/dL (ref 3.5–5.0)
Alkaline Phosphatase: 82 U/L (ref 40–150)
BUN: 11.2 mg/dL (ref 7.0–26.0)
CHLORIDE: 106 meq/L (ref 98–109)
CO2: 26 meq/L (ref 22–29)
Calcium: 9 mg/dL (ref 8.4–10.4)
Creatinine: 0.7 mg/dL (ref 0.7–1.3)
Glucose: 146 mg/dl — ABNORMAL HIGH (ref 70–140)
Potassium: 3.3 mEq/L — ABNORMAL LOW (ref 3.5–5.1)
Sodium: 141 mEq/L (ref 136–145)
Total Bilirubin: 0.22 mg/dL (ref 0.20–1.20)
Total Protein: 5.9 g/dL — ABNORMAL LOW (ref 6.4–8.3)

## 2017-10-29 LAB — TSH: TSH: 1.858 m[IU]/L (ref 0.320–4.118)

## 2017-10-29 MED ORDER — SODIUM CHLORIDE 0.9 % IV SOLN
Freq: Once | INTRAVENOUS | Status: AC
  Administered 2017-10-29: 14:00:00 via INTRAVENOUS

## 2017-10-29 MED ORDER — SODIUM CHLORIDE 0.9% FLUSH
10.0000 mL | INTRAVENOUS | Status: DC | PRN
  Administered 2017-10-29: 10 mL
  Filled 2017-10-29: qty 10

## 2017-10-29 MED ORDER — SODIUM CHLORIDE 0.9 % IV SOLN
200.0000 mg | Freq: Once | INTRAVENOUS | Status: AC
  Administered 2017-10-29: 200 mg via INTRAVENOUS
  Filled 2017-10-29: qty 8

## 2017-10-29 MED ORDER — HEPARIN SOD (PORK) LOCK FLUSH 100 UNIT/ML IV SOLN
500.0000 [IU] | Freq: Once | INTRAVENOUS | Status: AC | PRN
  Administered 2017-10-29: 500 [IU]
  Filled 2017-10-29: qty 5

## 2017-10-29 MED ORDER — SODIUM CHLORIDE 0.9% FLUSH
10.0000 mL | Freq: Once | INTRAVENOUS | Status: AC
  Administered 2017-10-29: 10 mL
  Filled 2017-10-29: qty 10

## 2017-10-29 NOTE — Patient Instructions (Signed)
[facility] Discharge Instructions for Patients Receiving Chemotherapy  Today you received the following chemotherapy agents :  Keytruda.  To help prevent nausea and vomiting after your treatment, we encourage you to take your nausea medication as prescribed.   If you develop nausea and vomiting that is not controlled by your nausea medication, call the clinic.   BELOW ARE SYMPTOMS THAT SHOULD BE REPORTED IMMEDIATELY:  *FEVER GREATER THAN 100.5 F  *CHILLS WITH OR WITHOUT FEVER  NAUSEA AND VOMITING THAT IS NOT CONTROLLED WITH YOUR NAUSEA MEDICATION  *UNUSUAL SHORTNESS OF BREATH  *UNUSUAL BRUISING OR BLEEDING  TENDERNESS IN MOUTH AND THROAT WITH OR WITHOUT PRESENCE OF ULCERS  *URINARY PROBLEMS  *BOWEL PROBLEMS  UNUSUAL RASH Items with * indicate a potential emergency and should be followed up as soon as possible.  Feel free to call the clinic should you have any questions or concerns. The clinic phone number is [phone].  Please show the CHEMO ALERT CARD at check-in to the Emergency Department and triage nurse.  

## 2017-10-29 NOTE — Progress Notes (Signed)
[facility] [phone]   [phone]  OFFICE PROGRESS NOTE  [name], Trevor Lacks, MD [place] [address]  DIAGNOSIS:  1) Stage IIB/IV (T3, N3, M1a) non-small cell lung cancer, squamous cell carcinoma presented with large left hilar mass in addition to mediastinal and left supraclavicular lymphadenopathy as well as contralateral right upper lobe nodule diagnosed in August 2018. PDL 1 expression: 90%. 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 First dose 06/29/2017. Status post 6 cycles. Last dose was given 08/03/2017.  CURRENT THERAPY: Immunotherapy with Ketruda 200 MG IV every 3 weeks, first dose 09/17/2017.  Status post 2 cycles.  INTERVAL HISTORY: Trevor Zavala 51 y.o. male returns to the clinic today for follow-up visit accompanied by his wife.  The patient is feeling fine today with no specific complaints except for mild skin rash on the face.  He is applying hydrocortisone cream to the areas.  He denied having any current chest pain, shortness of breath, cough or hemoptysis.  He denied having any fever or chills.  He has no significant weight loss or night sweats.  He continues to tolerate his treatment with Keytruda fairly well.  The patient is here today for evaluation before starting cycle #3.   MEDICAL HISTORY: Past Medical History:  Diagnosis Date  . Anxiety   . COPD (chronic obstructive pulmonary disease) ([facility])   . GERD (gastroesophageal reflux disease)   . Headache   . Hypertension   . Pneumonia   . Situational depression   . Stage III squamous cell carcinoma of left lung ([place]) 06/19/2017    ALLERGIES:  has No Known Allergies.  MEDICATIONS:  Current Outpatient Medications  Medication Sig Dispense Refill  . BREO ELLIPTA 100-25 MCG/INH AEPB INHALE 1 PUFF INTO THE LUNGS DAILY 60 each  11  . Cholecalciferol (VITAMIN D PO) Take 1 tablet by mouth daily.    . clonazePAM (KLONOPIN) 0.5 MG tablet Take 1 tablet (0.5 mg total) by mouth 2 (two) times daily as needed for anxiety. 180 tablet 0  . docusate sodium (COLACE) 100 MG capsule Take 100 mg daily by mouth.    . esomeprazole (NEXIUM) 40 MG capsule TAKE 1 CAPSULE BY MOUTH DAILY WITH BREAKFAST 90 capsule 1  . fentaNYL (DURAGESIC - DOSED MCG/HR) 75 MCG/HR Place 1 patch (75 mcg total) onto the skin every 3 (three) days. (Patient not taking: Reported on 09/22/2017) 10 patch 0  . HYDROmorphone (DILAUDID) 4 MG tablet Place 1 tablet (4 mg total) into feeding tube every 6 (six) hours as needed for severe pain. (Patient not taking: Reported on 09/22/2017) 15 tablet 0  . lidocaine (XYLOCAINE) 2 % solution 5 ml po q 8 hours as needed for mouth and throat pain (Patient not taking: Reported on 09/07/2017) 100 mL 1  . lidocaine-prilocaine (EMLA) cream Apply 1 application topically as needed. 30 g 1  . loratadine (CLARITIN) 10 MG tablet Take 10 mg by mouth daily as needed for allergies.    . nicotine (NICODERM CQ) 21 mg/24hr patch Place 1 patch (21 mg total) onto the skin daily. 28 patch 0  . potassium chloride SA (K-DUR,KLOR-CON) 20 MEQ tablet Take 1 tablet daily for 10 days. 10 tablet 0  . prochlorperazine (COMPAZINE) 10 MG tablet Take 1 tablet (10 mg total) by mouth every 6 (six) hours as needed for nausea  or vomiting. (Patient not taking: Reported on 09/22/2017) 30 tablet 0  . sennosides (SENOKOT) 8.8 MG/5ML syrup Place 5 mLs into feeding tube at bedtime. (Patient not taking: Reported on 09/22/2017) 240 mL 0  . varenicline (CHANTIX STARTING MONTH PAK) 0.5 MG X 11 & 1 MG X 42 tablet Take one 0.5 mg tablet by mouth once daily for 3 days, then increase to one 0.5 mg tablet twice daily for 4 days, then increase to one 1 mg tablet twice daily. 53 tablet 0   No current facility-administered medications for this visit.     SURGICAL HISTORY:  Past  Surgical History:  Procedure Laterality Date  . COLONOSCOPY    . DIRECT LARYNGOSCOPY N/A 06/25/2017   Procedure: DIRECT LARYNGOSCOPY AND BIOPSY;  Surgeon: Rozetta Nunnery, MD;  Location: [facility];  Service: ENT;  Laterality: N/A;  . EAR CYST EXCISION N/A 11/17/2013   Procedure: SEBACEOUS CYST CHEST;  Surgeon: Joyice Faster. Cornett, MD;  Location: [facility];  Service: General;  Laterality: N/A;  . IR GASTROSTOMY TUBE MOD SED  08/10/2017  . IR GASTROSTOMY TUBE REMOVAL  09/29/2017  . KNEE ARTHROSCOPY     LEFT  . LIPOMA EXCISION N/A 11/17/2013   Procedure: EXCISION LIPOMA FOREHEAD;  Surgeon: Joyice Faster. Cornett, MD;  Location: [facility];  Service: General;  Laterality: N/A;  . LUNG BIOPSY Bilateral 06/12/2017   Procedure: LEFT LUNG BIOPSY;  Surgeon: Grace Isaac, MD;  Location: [facility];  Service: Thoracic;  Laterality: Bilateral;  . PORTACATH PLACEMENT Right 07/01/2017   Procedure: INSERTION PORT-A-CATH - RIGHT IJ - placed with Fluoro and Ultrasound;  Surgeon: Grace Isaac, MD;  Location: [facility];  Service: Thoracic;  Laterality: Right;  Marland Kitchen VIDEO BRONCHOSCOPY WITH ENDOBRONCHIAL ULTRASOUND N/A 06/12/2017   Procedure: VIDEO BRONCHOSCOPY WITH ENDOBRONCHIAL ULTRASOUND;  Surgeon: Grace Isaac, MD;  Location: [facility];  Service: Thoracic;  Laterality: N/A;    REVIEW OF SYSTEMS:  A comprehensive review of systems was negative.   PHYSICAL EXAMINATION: General appearance: alert, cooperative and no distress Head: Normocephalic, without obvious abnormality, atraumatic Neck: no adenopathy, no JVD, supple, symmetrical, trachea midline and thyroid not enlarged, symmetric, no tenderness/mass/nodules Lymph nodes: Cervical, supraclavicular, and axillary nodes normal. Resp: clear to auscultation bilaterally Back: symmetric, no curvature. ROM normal. No CVA tenderness. Cardio: regular rate and rhythm, S1, S2 normal, no murmur, click, rub or gallop GI: soft,  non-tender; bowel sounds normal; no masses,  no organomegaly Extremities: extremities normal, atraumatic, no cyanosis or edema  ECOG PERFORMANCE STATUS: 1 - Symptomatic but completely ambulatory  Blood pressure (!) 146/87, pulse 83, temperature 98.6 F (37 C), temperature source Oral, resp. rate 18, height 5\' 11"  (1.803 m), weight 157 lb 12.8 oz (71.6 kg), SpO2 98 %.  LABORATORY DATA: Lab Results  Component Value Date   WBC 4.4 10/29/2017   HGB 13.5 10/29/2017   HCT 40.4 10/29/2017   MCV 92.6 10/29/2017   PLT 226 10/29/2017      Chemistry      Component Value Date/Time   NA 141 10/29/2017 1049   K 3.3 (L) 10/29/2017 1049   CL 95 (L) 08/17/2017 0416   CO2 26 10/29/2017 1049   BUN 11.2 10/29/2017 1049   CREATININE 0.7 10/29/2017 1049   GLU 198 08/25/2017      Component Value Date/Time   CALCIUM 9.0 10/29/2017 1049   ALKPHOS 82 10/29/2017 1049   AST 10 10/29/2017 1049   ALT 8 10/29/2017  1049   BILITOT 0.22 10/29/2017 1049       RADIOGRAPHIC STUDIES: Ir Gastrostomy Tube Removal/repair  Result Date: 09/29/2017 INDICATION: Patient with history of squamous cell carcinoma of left lung and epiglottis, status post gastrostomy tube placement on 08/10/2017; patient is currently eating and request received for gastrostomy tube removal. EXAM: GASTROSTOMY TUBE REMOVAL MEDICATIONS: Viscous lidocaine into gastrostomy tube insertion site tract ANESTHESIA/SEDATION: None CONTRAST:  None FLUOROSCOPY TIME:  None COMPLICATIONS: None immediate. PROCEDURE: Informed consent was obtained from the patient after a thorough discussion of the procedural risks, benefits and alternatives. All questions were addressed. Correct patient and order confirmation performed. Viscous lidocaine was applied into gastrostomy tube insertion site tract. The 34 French gastrostomy tube was removed in its entirety via manual traction without immediate complications. Gauze dressing applied over site. IMPRESSION: Successful  removal of 20 French pull-through gastrostomy tube in its entirety without immediate complications. Read by: Rowe Robert, PA-C Electronically Signed   By: Jacqulynn Cadet M.D.   On: 09/29/2017 12:39    ASSESSMENT AND PLAN: This is a very pleasant 51 years old white male with recently diagnosed stage IIIB/IV non-small cell lung cancer, adenocarcinoma presented with large left hilar mass in addition to mediastinal and left supraclavicular lymphadenopathy as well as suspicious right upper lobe pulmonary nodule diagnosed in August 2018. 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 He underwent a course of concurrent chemoradiation to the lung as well as the epiglottic area under the care of Dr. Tammi Klippel. He is status post 6 cycle. He tolerated this course of treatment well except for the radiation induced esophagitis as well as weight loss and fatigue. The patient had partial response to the previous treatment. He is currently on treatment with Keytruda 200 mg IV every 3 weeks status post 2 cycles. He is tolerating this treatment fairly well with no significant adverse effects. I recommended for the patient to proceed with cycle #3 today as a scheduled. I will see him back for follow-up visit in 3 weeks for evaluation after repeating CT scan of the neck and chest  for restaging of his disease. The patient voices understanding of current disease status and treatment options and is in agreement with the current care plan. All questions were answered. The patient knows to call the clinic with any problems, questions or concerns. We can certainly see the patient much sooner if necessary. Disclaimer: This note was dictated with voice recognition software. Similar sounding words can inadvertently be transcribed and may not be corrected upon review.

## 2017-10-29 NOTE — Telephone Encounter (Signed)
Scheduled appt per 12/20 los - Gave patient AVS and calender per los.  

## 2017-10-29 NOTE — Patient Instructions (Signed)

## 2017-11-17 ENCOUNTER — Ambulatory Visit (HOSPITAL_COMMUNITY)
Admission: RE | Admit: 2017-11-17 | Discharge: 2017-11-17 | Disposition: A | Discharge status: Home / Self Care | Payer: Managed Care, Other (non HMO) | Source: Ambulatory Visit | Attending: Internal Medicine | Admitting: Internal Medicine

## 2017-11-17 DIAGNOSIS — R918 Other nonspecific abnormal finding of lung field: Secondary | ICD-10-CM | POA: Insufficient documentation

## 2017-11-17 DIAGNOSIS — C3490 Malignant neoplasm of unspecified part of unspecified bronchus or lung: Secondary | ICD-10-CM | POA: Insufficient documentation

## 2017-11-17 MED ORDER — IOPAMIDOL (ISOVUE-300) INJECTION 61%
75.0000 mL | Freq: Once | INTRAVENOUS | Status: AC | PRN
  Administered 2017-11-17: 75 mL via INTRAVENOUS

## 2017-11-17 MED ORDER — HEPARIN SOD (PORK) LOCK FLUSH 100 UNIT/ML IV SOLN
500.0000 [IU] | Freq: Once | INTRAVENOUS | Status: AC
  Administered 2017-11-17: 500 [IU]

## 2017-11-17 MED ORDER — HEPARIN SOD (PORK) LOCK FLUSH 100 UNIT/ML IV SOLN
INTRAVENOUS | Status: AC
  Filled 2017-11-17: qty 5

## 2017-11-17 MED ORDER — IOPAMIDOL (ISOVUE-300) INJECTION 61%
INTRAVENOUS | Status: AC
  Filled 2017-11-17: qty 75

## 2017-11-19 ENCOUNTER — Inpatient Hospital Stay (HOSPITAL_BASED_OUTPATIENT_CLINIC_OR_DEPARTMENT_OTHER): Payer: Managed Care, Other (non HMO) | Admitting: Oncology

## 2017-11-19 ENCOUNTER — Encounter: Payer: Self-pay | Admitting: Oncology

## 2017-11-19 ENCOUNTER — Inpatient Hospital Stay: Payer: Managed Care, Other (non HMO)

## 2017-11-19 ENCOUNTER — Telehealth: Payer: Self-pay | Admitting: *Deleted

## 2017-11-19 ENCOUNTER — Inpatient Hospital Stay: Payer: Managed Care, Other (non HMO) | Attending: Internal Medicine

## 2017-11-19 ENCOUNTER — Other Ambulatory Visit: Payer: Self-pay

## 2017-11-19 VITALS — BP 161/88 | HR 76 | Temp 97.9°F | Resp 18 | Ht 71.0 in | Wt 158.7 lb

## 2017-11-19 DIAGNOSIS — C3412 Malignant neoplasm of upper lobe, left bronchus or lung: Secondary | ICD-10-CM | POA: Diagnosis not present

## 2017-11-19 DIAGNOSIS — Z95828 Presence of other vascular implants and grafts: Secondary | ICD-10-CM

## 2017-11-19 DIAGNOSIS — R5382 Chronic fatigue, unspecified: Secondary | ICD-10-CM

## 2017-11-19 DIAGNOSIS — C321 Malignant neoplasm of supraglottis: Secondary | ICD-10-CM

## 2017-11-19 DIAGNOSIS — Z5112 Encounter for antineoplastic immunotherapy: Secondary | ICD-10-CM | POA: Diagnosis present

## 2017-11-19 LAB — CBC WITH DIFFERENTIAL/PLATELET
BASOS PCT: 0 %
Basophils Absolute: 0 10*3/uL (ref 0.0–0.1)
Eosinophils Absolute: 0.1 10*3/uL (ref 0.0–0.5)
Eosinophils Relative: 1 %
HEMATOCRIT: 42.2 % (ref 38.4–49.9)
HEMOGLOBIN: 13.9 g/dL (ref 13.0–17.1)
LYMPHS ABS: 0.5 10*3/uL — AB (ref 0.9–3.3)
Lymphocytes Relative: 11 %
MCH: 30.4 pg (ref 27.2–33.4)
MCHC: 32.9 g/dL (ref 32.0–36.0)
MCV: 92.3 fL (ref 79.3–98.0)
MONOS PCT: 9 %
Monocytes Absolute: 0.4 10*3/uL (ref 0.1–0.9)
NEUTROS ABS: 3.8 10*3/uL (ref 1.5–6.5)
NEUTROS PCT: 79 %
Platelets: 226 10*3/uL (ref 140–400)
RBC: 4.57 MIL/uL (ref 4.20–5.82)
RDW: 12.5 % (ref 11.0–15.6)
WBC: 4.9 10*3/uL (ref 4.0–10.3)

## 2017-11-19 LAB — TSH: TSH: 2.387 u[IU]/mL (ref 0.320–4.118)

## 2017-11-19 LAB — COMPREHENSIVE METABOLIC PANEL
ALT: 8 U/L (ref 0–55)
ANION GAP: 9 (ref 3–11)
AST: 8 U/L (ref 5–34)
Albumin: 3.5 g/dL (ref 3.5–5.0)
Alkaline Phosphatase: 73 U/L (ref 40–150)
BUN: 11 mg/dL (ref 7–26)
CALCIUM: 9 mg/dL (ref 8.4–10.4)
CHLORIDE: 106 mmol/L (ref 98–109)
CO2: 27 mmol/L (ref 22–29)
CREATININE: 0.76 mg/dL (ref 0.70–1.30)
Glucose, Bld: 127 mg/dL (ref 70–140)
Potassium: 3.3 mmol/L — ABNORMAL LOW (ref 3.5–5.1)
SODIUM: 142 mmol/L (ref 136–145)
Total Bilirubin: <0.2 mg/dL — ABNORMAL LOW (ref 0.2–1.2)
Total Protein: 5.9 g/dL — ABNORMAL LOW (ref 6.4–8.3)

## 2017-11-19 MED ORDER — SODIUM CHLORIDE 0.9% FLUSH
10.0000 mL | INTRAVENOUS | Status: DC | PRN
  Administered 2017-11-19: 10 mL
  Filled 2017-11-19: qty 10

## 2017-11-19 MED ORDER — SODIUM CHLORIDE 0.9 % IV SOLN
Freq: Once | INTRAVENOUS | Status: AC
  Administered 2017-11-19: 11:00:00 via INTRAVENOUS

## 2017-11-19 MED ORDER — SODIUM CHLORIDE 0.9% FLUSH
10.0000 mL | Freq: Once | INTRAVENOUS | Status: AC
  Administered 2017-11-19: 10 mL
  Filled 2017-11-19: qty 10

## 2017-11-19 MED ORDER — PEMBROLIZUMAB CHEMO INJECTION 100 MG/4ML
200.0000 mg | Freq: Once | INTRAVENOUS | Status: AC
  Administered 2017-11-19: 200 mg via INTRAVENOUS
  Filled 2017-11-19: qty 8

## 2017-11-19 MED ORDER — HEPARIN SOD (PORK) LOCK FLUSH 100 UNIT/ML IV SOLN
500.0000 [IU] | Freq: Once | INTRAVENOUS | Status: AC | PRN
  Administered 2017-11-19: 500 [IU]
  Filled 2017-11-19: qty 5

## 2017-11-19 NOTE — Progress Notes (Signed)
[facility] OFFICE PROGRESS NOTE  [name], [name], MD [place] [address]  DIAGNOSIS:  1) Stage IIB/IV (T3, N3, M1a) non-small cell lung cancer, squamous cell carcinoma presented with large left hilar mass in addition to mediastinal and left supraclavicular lymphadenopathy as well as contralateral right upper lobe nodule diagnosed in August 2018. PDL 1 expression: 90%. 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 First dose 06/29/2017. Status post 6 cycles. Last dose was given 08/03/2017.  CURRENT THERAPY: Immunotherapy with Ketruda 200 MG IV every 3 weeks, first dose 09/17/2017.  Status post 3 cycles.  INTERVAL HISTORY: ANGLE KAREL 52 y.o. male returns for a routine follow up visit accompanied by his wife.  The patient is feeling fine today and has no specific complaints except for mild rash to his face.  Is applying hydrocortisone cream to these areas.  He denies any fevers or chills.  Denies chest pain, shortness breath, cough, hemoptysis.  Denies nausea, vomiting, constipation, diarrhea.  He has had no significant weight loss or night sweats.  He continues to tolerate treatment with [facility] fairly well.  The patient is here for evaluation prior to starting cycle #4 and to review his restaging CT scan results.  MEDICAL HISTORY: Past Medical History:  Diagnosis Date  . Anxiety   . COPD (chronic obstructive pulmonary disease) ([place])   . GERD (gastroesophageal reflux disease)   . Headache   . Hypertension   . Pneumonia   . Situational depression   . Stage III squamous cell carcinoma of left lung ([place]) 06/19/2017    ALLERGIES:  has No Known Allergies.  MEDICATIONS:  Current Outpatient Medications  Medication Sig Dispense Refill  . BREO ELLIPTA 100-25 MCG/INH AEPB INHALE 1 PUFF INTO THE LUNGS DAILY 60 each 11  .  clonazePAM (KLONOPIN) 0.5 MG tablet Take 1 tablet (0.5 mg total) by mouth 2 (two) times daily as needed for anxiety. 180 tablet 0  . docusate sodium (COLACE) 100 MG capsule Take 100 mg daily by mouth.    . esomeprazole (NEXIUM) 40 MG capsule TAKE 1 CAPSULE BY MOUTH DAILY WITH BREAKFAST 90 capsule 1  . loratadine (CLARITIN) 10 MG tablet Take 10 mg by mouth daily as needed for allergies.    . nicotine (NICODERM CQ) 21 mg/24hr patch Place 1 patch (21 mg total) onto the skin daily. 28 patch 0  . lidocaine (XYLOCAINE) 2 % solution 5 ml po q 8 hours as needed for mouth and throat pain (Patient not taking: Reported on 09/07/2017) 100 mL 1  . lidocaine-prilocaine (EMLA) cream Apply 1 application topically as needed. (Patient not taking: Reported on 10/29/2017) 30 g 1  . potassium chloride SA (K-DUR,KLOR-CON) 20 MEQ tablet Take 1 tablet daily for 10 days. (Patient not taking: Reported on 11/19/2017) 10 tablet 0  . varenicline (CHANTIX STARTING MONTH PAK) 0.5 MG X 11 & 1 MG X 42 tablet Take one 0.5 mg tablet by mouth once daily for 3 days, then increase to one 0.5 mg tablet twice daily for 4 days, then increase to one 1 mg tablet twice daily. (Patient not taking: Reported on 10/29/2017) 53 tablet 0   No current facility-administered medications for this visit.    Facility-Administered Medications Ordered in Other Visits  Medication Dose Route Frequency Provider Last Rate Last Dose  . sodium chloride flush (NS) 0.9 % injection 10  mL  10 mL Intracatheter PRN Curt Bears, MD   10 mL at 11/19/17 1258    SURGICAL HISTORY:  Past Surgical History:  Procedure Laterality Date  . COLONOSCOPY    . DIRECT LARYNGOSCOPY N/A 06/25/2017   Procedure: DIRECT LARYNGOSCOPY AND BIOPSY;  Surgeon: Rozetta Nunnery, MD;  Location: [facility];  Service: ENT;  Laterality: N/A;  . EAR CYST EXCISION N/A 11/17/2013   Procedure: SEBACEOUS CYST CHEST;  Surgeon: Joyice Faster. Cornett, MD;  Location: [facility];  Service: General;  Laterality: N/A;  . IR GASTROSTOMY TUBE MOD SED  08/10/2017  . IR GASTROSTOMY TUBE REMOVAL  09/29/2017  . KNEE ARTHROSCOPY     LEFT  . LIPOMA EXCISION N/A 11/17/2013   Procedure: EXCISION LIPOMA FOREHEAD;  Surgeon: Joyice Faster. Cornett, MD;  Location: [facility];  Service: General;  Laterality: N/A;  . LUNG BIOPSY Bilateral 06/12/2017   Procedure: LEFT LUNG BIOPSY;  Surgeon: Grace Isaac, MD;  Location: [facility];  Service: Thoracic;  Laterality: Bilateral;  . PORTACATH PLACEMENT Right 07/01/2017   Procedure: INSERTION PORT-A-CATH - RIGHT IJ - placed with Fluoro and Ultrasound;  Surgeon: Grace Isaac, MD;  Location: [facility];  Service: Thoracic;  Laterality: Right;  Marland Kitchen VIDEO BRONCHOSCOPY WITH ENDOBRONCHIAL ULTRASOUND N/A 06/12/2017   Procedure: VIDEO BRONCHOSCOPY WITH ENDOBRONCHIAL ULTRASOUND;  Surgeon: Grace Isaac, MD;  Location: [facility];  Service: Thoracic;  Laterality: N/A;    REVIEW OF SYSTEMS:   Review of Systems  Constitutional: Negative for appetite change, chills, fatigue, fever and unexpected weight change.  HENT:   Negative for mouth sores, nosebleeds, sore throat and trouble swallowing.   Eyes: Negative for eye problems and icterus.  Respiratory: Negative for cough, hemoptysis, shortness of breath and wheezing.   Cardiovascular: Negative for chest pain and leg swelling.  Gastrointestinal: Negative for abdominal pain, constipation, diarrhea, nausea and vomiting.  Genitourinary: Negative for bladder incontinence, difficulty urinating, dysuria, frequency and hematuria.   Musculoskeletal: Negative for back pain, gait problem, neck pain and neck stiffness.  Skin: Positive for itching and rash.  Neurological: Negative for dizziness, extremity weakness, gait problem, headaches, light-headedness and seizures.  Hematological: Negative for adenopathy. Does not bruise/bleed easily.  Psychiatric/Behavioral: Negative for confusion, depression  and sleep disturbance. The patient is not nervous/anxious.     PHYSICAL EXAMINATION:  Blood pressure (!) 161/88, pulse 76, temperature 97.9 F (36.6 C), temperature source Oral, resp. rate 18, height 5\' 11"  (1.803 m), weight 158 lb 11.2 oz (72 kg), SpO2 97 %.  ECOG PERFORMANCE STATUS: 1 - Symptomatic but completely ambulatory  Physical Exam  Constitutional: Oriented to person, place, and time and well-developed, well-nourished, and in no distress. No distress.  HENT:  Head: Normocephalic and atraumatic.  Mouth/Throat: Oropharynx is clear and moist. No oropharyngeal exudate.  Eyes: Conjunctivae are normal. Right eye exhibits no discharge. Left eye exhibits no discharge. No scleral icterus.  Neck: Normal range of motion. Neck supple.  Cardiovascular: Normal rate, regular rhythm, normal heart sounds and intact distal pulses.   Pulmonary/Chest: Effort normal and breath sounds normal. No respiratory distress. No wheezes. No rales.  Abdominal: Soft. Bowel sounds are normal. Exhibits no distension and no mass. There is no tenderness.  Musculoskeletal: Normal range of motion. Exhibits no edema.  Lymphadenopathy:    No cervical adenopathy.  Neurological: Alert and oriented to person, place, and time. Exhibits normal muscle tone. Gait normal. Coordination normal.  Skin: Skin is warm and dry. Not  diaphoretic. No erythema. No pallor. Faint rash to his forehead and bilateral temple. Psychiatric: Mood, memory and judgment normal.  Vitals reviewed.  LABORATORY DATA: Lab Results  Component Value Date   WBC 4.9 11/19/2017   HGB 13.9 11/19/2017   HCT 42.2 11/19/2017   MCV 92.3 11/19/2017   PLT 226 11/19/2017      Chemistry      Component Value Date/Time   NA 142 11/19/2017 0909   NA 141 10/29/2017 1049   K 3.3 (L) 11/19/2017 0909   K 3.3 (L) 10/29/2017 1049   CL 106 11/19/2017 0909   CO2 27 11/19/2017 0909   CO2 26 10/29/2017 1049   BUN 11 11/19/2017 0909   BUN 11.2 10/29/2017 1049    CREATININE 0.76 11/19/2017 0909   CREATININE 0.7 10/29/2017 1049   GLU 198 08/25/2017      Component Value Date/Time   CALCIUM 9.0 11/19/2017 0909   CALCIUM 9.0 10/29/2017 1049   ALKPHOS 73 11/19/2017 0909   ALKPHOS 82 10/29/2017 1049   AST 8 11/19/2017 0909   AST 10 10/29/2017 1049   ALT 8 11/19/2017 0909   ALT 8 10/29/2017 1049   BILITOT <0.2 (L) 11/19/2017 0909   BILITOT 0.22 10/29/2017 1049       RADIOGRAPHIC STUDIES:  Ct Soft Tissue Neck W Contrast  Result Date: 11/17/2017 CLINICAL DATA:  Malignant neoplasm of the lung. Restaging. Stage IIB/IV non-small cell lung cancer with left supraclavicular lymphadenopathy. Squamous cell carcinoma of the epiglottis. EXAM: CT NECK WITH CONTRAST TECHNIQUE: Multidetector CT imaging of the neck was performed using the standard protocol following the bolus administration of intravenous contrast. CONTRAST:  63mL ISOVUE-300 IOPAMIDOL (ISOVUE-300) INJECTION 61% COMPARISON:  PET scan 09/02/2017.  CT neck with contrast 08/07/2017. FINDINGS: Pharynx and larynx: Chronic mucosal thickening involving the epiglottis, oropharynx, and hypopharynx is compatible with prior radiation. No residual or recurrent mass lesion is present. The larynx is within normal limits. Vocal cords are midline and symmetric. Salivary glands: The submandibular and parotid glands are within normal limits bilaterally. Mild stranding about the left submandibular gland is likely related to radiation. Thyroid: Within normal limits. Lymph nodes: Previously noted left supraclavicular lymph node is smaller on today's study. Short axis measurement is now 3 mm. No other significant adenopathy is present. There are no new lymph nodes. Vascular: Mild stranding in the left carotid space likely reflects post radiation changes. Atherosclerotic changes are present bilaterally. Asymmetry on the left may be due to radiation. There is no significant stenosis relative to the more distal vessels. Limited  intracranial: Within normal limits. Visualized orbits: Unremarkable. Mastoids and visualized paranasal sinuses: The paranasal sinuses are clear. The right-sided mastoid air cells are clear. Left mastoidectomy is stable. Skeleton: Vertebral body heights and alignment are normal. No focal lytic or blastic lesions are present. Upper chest: The lung apices are clear. Please see dedicated CT of the chest same day. Mild postradiation changes are noted bilaterally. IMPRESSION: 1. Continued decrease in size of the left supraclavicular lymph node, near completely resolved. 2. No new adenopathy or evidence for metastatic disease to the neck. 3. Post radiation changes involving the oropharynx and hypopharynx without evidence for residual or recurrent tumor at the level of the epiglottis. Electronically Signed   By: San Morelle M.D.   On: 11/17/2017 09:46   Ct Chest W Contrast  Result Date: 11/17/2017 CLINICAL DATA:  Non-small cell lung cancer status post chemo radiation therapy EXAM: CT CHEST WITH CONTRAST TECHNIQUE: Multidetector CT imaging of  the chest was performed during intravenous contrast administration. CONTRAST:  63mL ISOVUE-300 IOPAMIDOL (ISOVUE-300) INJECTION 61% COMPARISON:  PET-CT 01/03/2017 FINDINGS: Cardiovascular: No significant vascular findings. Normal heart size. No pericardial effusion. Mediastinum/Nodes: No axillary or supraclavicular adenopathy. Port in the RIGHT anterior chest wall. Lungs/Pleura: There is peribronchial thickening in the LEFT hilum improved from comparison exam. Upper Abdomen: Field reticulation and linear consolidation in the medial LEFT upper lobe consistent with radiation change. A focus of nodularity measuring 14 mm within the reticular pattern (image 60, series 7). More linear band of consolidation measures 22 mm on image 43, series 07/07. Fine reticulation in the medial RIGHT upper lobe also RIGHT radiation. New nodularity in the lower lobes Musculoskeletal: No  aggressive osseous lesion IMPRESSION: 1. New perihilar upper lobe reticulation consistent radiation therapy. 2. Interval decrease peribronchial thickening in the LEFT hilum. 3. New nodular densities in the LEFT upper lobe likely relates radiation therapy. Recommend attention on follow-up. Electronically Signed   By: Suzy Bouchard M.D.   On: 11/17/2017 09:10     ASSESSMENT/PLAN:  Stage III squamous cell carcinoma of left lung [facility]) This is a very pleasant 52 year old white male with recently diagnosed stage IIIB/IV non-small cell lung cancer, adenocarcinoma presented with large left hilar mass in addition to mediastinal and left supraclavicular lymphadenopathy as well as suspicious right upper lobe pulmonary nodule diagnosed in August 2018. 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 He underwent a course of concurrent chemoradiation to the lung as well as the epiglottic area under the care of Dr. Tammi Klippel. He is status post 6 cycle. He tolerated this course of treatment well except for the radiation induced esophagitis as well as weight loss and fatigue. The patient had partial response to the previous treatment. He is currently on treatment with Keytruda 200 mg IV every 3 weeks status post 3 cycles. He is tolerating this treatment fairly well with no significant adverse effects.  The patient was seen with Dr. Julien Nordmann.  CT scan results were discussed with the patient and his wife which showed continued improvement in his disease.  Recommend that he continue with Keytruda 200 mg IV every 3 weeks.  He will proceed with cycle 4 today as scheduled. Follow-up visit will be in 3 weeks for evaluation prior to cycle #5.  The patient voices understanding of current disease status and treatment options and is in agreement with the current care plan. All questions were answered. The patient knows to call the clinic with any problems, questions or concerns. We can  certainly see the patient much sooner if necessary.  No orders of the defined types were placed in this encounter.   Mikey Bussing, DNP, AGPCNP-BC, AOCNP 11/19/17  ADDENDUM: Hematology/Oncology Attending: I had a face-to-face encounter with the patient.  I recommended her care plan.  This is a very pleasant 52 years old white male with a stage IIIb/IV non-small cell lung cancer, adenocarcinoma status post concurrent chemoradiation.  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 He is currently on treatment with immunotherapy with Keytruda status post 3 cycles.  He has been tolerating this treatment fairly well with no concerning complaints. He had repeat CT scan of the neck and chest performed recently.  His a scan showed further improvement of his disease with no concerning findings for disease progression. I personally and independently reviewed the scans and discussed the results with the patient and his wife.  I  recommended for the patient to continue his current treatment with [facility] and he will proceed with cycle #4 today. We will see him back for follow-up visit in 3 weeks for evaluation before starting cycle #5. The patient was advised to call immediately if he has any concerning symptoms in the interval.  Disclaimer: This note was dictated with voice recognition software. Similar sounding words can inadvertently be transcribed and may be missed upon review. Eilleen Kempf, MD 11/20/17

## 2017-11-19 NOTE — Assessment & Plan Note (Signed)
This is a very pleasant 52 year old white male with recently diagnosed stage IIIB/IV non-small cell lung cancer, adenocarcinoma presented with large left hilar mass in addition to mediastinal and left supraclavicular lymphadenopathy as well as suspicious right upper lobe pulmonary nodule diagnosed in August 2018. 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 He underwent a course of concurrent chemoradiation to the lung as well as the epiglottic area under the care of Dr. Tammi Klippel. He is status post 6 cycle. He tolerated this course of treatment well except for the radiation induced esophagitis as well as weight loss and fatigue. The patient had partial response to the previous treatment. He is currently on treatment with Keytruda 200 mg IV every 3 weeks status post 3 cycles. He is tolerating this treatment fairly well with no significant adverse effects.  The patient was seen with Dr. Julien Nordmann.  CT scan results were discussed with the patient and his wife which showed continued improvement in his disease.  Recommend that he continue with Keytruda 200 mg IV every 3 weeks.  He will proceed with cycle 4 today as scheduled. Follow-up visit will be in 3 weeks for evaluation prior to cycle #5.  The patient voices understanding of current disease status and treatment options and is in agreement with the current care plan. All questions were answered. The patient knows to call the clinic with any problems, questions or concerns. We can certainly see the patient much sooner if necessary.

## 2017-11-19 NOTE — Telephone Encounter (Signed)
Oncology Nurse Navigator Documentation  Spoke with [facility], requested pt be contacted and routine post-RT follow-up scheduled with Dr. Redmond Baseman in mid-April.  Noted pt is being seen by [facility] next week for same.  She voiced understanding.  Gayleen Orem, RN, BSN Head & Neck Oncology Nurse [facility] [place] [phone]

## 2017-11-19 NOTE — Patient Instructions (Signed)
[facility] Discharge Instructions for Patients Receiving Chemotherapy  Today you received the following chemotherapy agents :  Keytruda.  To help prevent nausea and vomiting after your treatment, we encourage you to take your nausea medication as prescribed.   If you develop nausea and vomiting that is not controlled by your nausea medication, call the clinic.   BELOW ARE SYMPTOMS THAT SHOULD BE REPORTED IMMEDIATELY:  *FEVER GREATER THAN 100.5 F  *CHILLS WITH OR WITHOUT FEVER  NAUSEA AND VOMITING THAT IS NOT CONTROLLED WITH YOUR NAUSEA MEDICATION  *UNUSUAL SHORTNESS OF BREATH  *UNUSUAL BRUISING OR BLEEDING  TENDERNESS IN MOUTH AND THROAT WITH OR WITHOUT PRESENCE OF ULCERS  *URINARY PROBLEMS  *BOWEL PROBLEMS  UNUSUAL RASH Items with * indicate a potential emergency and should be followed up as soon as possible.  Feel free to call the clinic should you have any questions or concerns. The clinic phone number is [phone].  Please show the CHEMO ALERT CARD at check-in to the Emergency Department and triage nurse.  

## 2017-11-23 ENCOUNTER — Telehealth: Payer: Self-pay | Admitting: Oncology

## 2017-11-23 NOTE — Telephone Encounter (Signed)
Scheduled appt per 1/10 los - Patient to get an updated schedule next visit.

## 2017-11-25 ENCOUNTER — Telehealth: Payer: Self-pay | Admitting: Medical Oncology

## 2017-11-25 NOTE — Telephone Encounter (Signed)
disability questions -transferred call toHIM

## 2017-11-26 ENCOUNTER — Other Ambulatory Visit: Payer: Self-pay

## 2017-11-26 ENCOUNTER — Ambulatory Visit
Admission: RE | Admit: 2017-11-26 | Discharge: 2017-11-26 | Disposition: A | Discharge status: Home / Self Care | Payer: Managed Care, Other (non HMO) | Source: Ambulatory Visit | Attending: Urology | Admitting: Urology

## 2017-11-26 ENCOUNTER — Encounter: Payer: Self-pay | Admitting: Urology

## 2017-11-26 VITALS — BP 142/86 | HR 85 | Temp 98.4°F | Resp 20 | Ht 71.0 in | Wt 155.8 lb

## 2017-11-26 DIAGNOSIS — C3412 Malignant neoplasm of upper lobe, left bronchus or lung: Secondary | ICD-10-CM

## 2017-11-26 DIAGNOSIS — C321 Malignant neoplasm of supraglottis: Secondary | ICD-10-CM

## 2017-11-26 DIAGNOSIS — Z5112 Encounter for antineoplastic immunotherapy: Secondary | ICD-10-CM | POA: Diagnosis not present

## 2017-11-26 DIAGNOSIS — C3411 Malignant neoplasm of upper lobe, right bronchus or lung: Secondary | ICD-10-CM

## 2017-11-26 MED ORDER — LARYNGOSCOPY SOLUTION RAD-ONC
15.0000 mL | Freq: Once | TOPICAL | Status: DC
  Filled 2017-11-26: qty 15

## 2017-11-27 NOTE — Progress Notes (Signed)
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  Interval Since Last Radiation:  5 weeks  Curative concurrent chemoradiation 06/30/2017 to 08/18/2017:    1. The lung and larynx were treated to 44 Gy in 22 fractions of 2 Gy. 2. The targets were boosted to 22 Gy in 11 fractions of 2 Gy.   Narrative:  The patient returns today for routine follow-up.  He tolerated radiation treatment relatively well.  He developed brisk erythema to the neck with throat and neck irritation and difficulty with swallowing. He required PEG placement on 08/10/17 for nutrition and tolerated this well.  He reported productive cough with clear phlegm and shortness of breath with activity but denied nausea or vomiting.  He had a recent repeat PET scan for post treatment evaluation which reveals an overall good response to treatment. There is interval decrease in size of the left suprahilar mass and left supraclavicular lymph node. However, there is a new small hypermetabolic right hilar lymph node but no evidence of distant disease progression.  He has recently started immunotherapy with Keytruda, first dose was given 09/17/2017. He reports that he tolerated this very well.  He and his wife are preparing to leave for a 1 week vacation in [place] to celebrate his upcoming birthday.  On review of systems, the patient states that he is doing well overall. He reports resolution of the dysphagia and is able to eat and drink without difficulty at this point. He is scheduled to have his PEG tube  removed in the near future. He continues to have hoarseness of his voice. He denies any residual skin irritation in the neck or chest region. He denies chest pain, productive cough, hemoptysis, increased shortness of breath, fever, chills or night sweats. He reports a healthy appetite and is maintaining his weight. In fact he has actually gained approximately 7 pounds since completing treatment. He is very pleased with his progress.  He denies abdominal pain, nausea, vomiting or diarrhea but reports occasional constipation.  Recent TSH was WNL at 0.994 on 09/17/17.    ALLERGIES:  has No Known Allergies.  Meds: Current Outpatient Medications  Medication Sig Dispense Refill  . BREO ELLIPTA 100-25 MCG/INH AEPB INHALE 1 PUFF INTO THE LUNGS DAILY 60 each 11  . clonazePAM (KLONOPIN) 0.5 MG tablet Take 1 tablet (0.5 mg total) by mouth 2 (two) times daily as needed for anxiety. 180 tablet 0  . docusate sodium (COLACE) 100 MG capsule Take 100 mg daily by mouth.    . esomeprazole (NEXIUM) 40 MG capsule TAKE 1 CAPSULE BY MOUTH DAILY WITH BREAKFAST 90 capsule 1  . nicotine (NICODERM CQ) 21 mg/24hr patch Place 1 patch (21 mg total) onto the skin daily. 28 patch 0  . potassium chloride SA (K-DUR,KLOR-CON) 20 MEQ tablet Take 1 tablet daily for 10 days. 10 tablet 0  . varenicline (CHANTIX STARTING MONTH PAK) 0.5 MG X 11 & 1 MG X 42 tablet Take one 0.5 mg tablet by mouth once daily for 3 days, then increase to one 0.5 mg  tablet twice daily for 4 days, then increase to one 1 mg tablet twice daily. 53 tablet 0  . lidocaine (XYLOCAINE) 2 % solution 5 ml po q 8 hours as needed for mouth and throat pain (Patient not taking: Reported on 11/26/2017) 100 mL 1  . lidocaine-prilocaine (EMLA) cream Apply 1 application topically as needed. (Patient not taking: Reported on 10/29/2017) 30 g 1  . loratadine (CLARITIN) 10 MG tablet Take 10 mg by mouth daily as needed for allergies.     No current facility-administered medications  for this encounter.     Physical Findings:  height is 5\' 11"  (1.803 m) and weight is 155 lb 12.8 oz (70.7 kg). His oral temperature is 98.4 F (36.9 C). His blood pressure is 142/86 (abnormal) and his pulse is 85. His respiration is 20 and oxygen saturation is 95%.  Pain Assessment Pain Score: 0-No pain/10 In general this is a well appearing caucasian male in no acute distress. He's alert and oriented x4 and appropriate throughout the examination. Cardiopulmonary assessment is negative for acute distress and he exhibits normal effort.  Dr. Tammi Klippel performed direct nasopharyngoscopy following anesthetizing the nasopharynx with viscous lidocaine nasal spray.  The flexible nasopharyngoscope was advanced to the level of the oropharynx where the base of the tongue, vallecula and epiglottis are visualized. There were no abnormalities noted. The epiglottis was within normal limits and without edema or erythema. The scope was then further advanced to the level of the hypopharynx to the level of the true vocal cords. There was no evidence of erythema or edema of the posterior commissure,arytenoid cartilage or superior surface of the vocal cords. The laryngeal surface of the epiglottis was within normal limits. There was no evidence of mass lesion or nodularity of the vocal cords and the cords moved symmetrically with phonation. The patient had complete glottic closure upon phonation and the airway was patent and stable throughout the exam. The anterior commissure, epiglottic folds, false vocal cords and puriform sinuses were all within normal limits. The scope was then removed without difficulty area and the patient tolerated the procedure well and remained in stable condition.  Lab Findings: Lab Results  Component Value Date   WBC 4.9 11/19/2017   HGB 13.9 11/19/2017   HCT 42.2 11/19/2017   MCV 92.3 11/19/2017   PLT 226 11/19/2017     Radiographic Findings: Ct Soft Tissue Neck W Contrast  Result  Date: 11/17/2017 CLINICAL DATA:  Malignant neoplasm of the lung. Restaging. Stage IIB/IV non-small cell lung cancer with left supraclavicular lymphadenopathy. Squamous cell carcinoma of the epiglottis. EXAM: CT NECK WITH CONTRAST TECHNIQUE: Multidetector CT imaging of the neck was performed using the standard protocol following the bolus administration of intravenous contrast. CONTRAST:  37mL ISOVUE-300 IOPAMIDOL (ISOVUE-300) INJECTION 61% COMPARISON:  PET scan 09/02/2017.  CT neck with contrast 08/07/2017. FINDINGS: Pharynx and larynx: Chronic mucosal thickening involving the epiglottis, oropharynx, and hypopharynx is compatible with prior radiation. No residual or recurrent mass lesion is present. The larynx is within normal limits. Vocal cords are midline and symmetric. Salivary glands: The submandibular and parotid glands are within normal limits bilaterally. Mild stranding about the left submandibular gland is likely related to radiation. Thyroid: Within normal limits. Lymph nodes: Previously noted left supraclavicular lymph node is smaller on today's study. Short axis measurement is now 3 mm. No other significant adenopathy is present. There are no new lymph nodes. Vascular: Mild stranding in the left carotid space likely reflects post radiation changes. Atherosclerotic changes are  present bilaterally. Asymmetry on the left may be due to radiation. There is no significant stenosis relative to the more distal vessels. Limited intracranial: Within normal limits. Visualized orbits: Unremarkable. Mastoids and visualized paranasal sinuses: The paranasal sinuses are clear. The right-sided mastoid air cells are clear. Left mastoidectomy is stable. Skeleton: Vertebral body heights and alignment are normal. No focal lytic or blastic lesions are present. Upper chest: The lung apices are clear. Please see dedicated CT of the chest same day. Mild postradiation changes are noted bilaterally. IMPRESSION: 1. Continued  decrease in size of the left supraclavicular lymph node, near completely resolved. 2. No new adenopathy or evidence for metastatic disease to the neck. 3. Post radiation changes involving the oropharynx and hypopharynx without evidence for residual or recurrent tumor at the level of the epiglottis. Electronically Signed   By: San Morelle M.D.   On: 11/17/2017 09:46   Ct Chest W Contrast  Result Date: 11/17/2017 CLINICAL DATA:  Non-small cell lung cancer status post chemo radiation therapy EXAM: CT CHEST WITH CONTRAST TECHNIQUE: Multidetector CT imaging of the chest was performed during intravenous contrast administration. CONTRAST:  39mL ISOVUE-300 IOPAMIDOL (ISOVUE-300) INJECTION 61% COMPARISON:  PET-CT 01/03/2017 FINDINGS: Cardiovascular: No significant vascular findings. Normal heart size. No pericardial effusion. Mediastinum/Nodes: No axillary or supraclavicular adenopathy. Port in the RIGHT anterior chest wall. Lungs/Pleura: There is peribronchial thickening in the LEFT hilum improved from comparison exam. Upper Abdomen: Field reticulation and linear consolidation in the medial LEFT upper lobe consistent with radiation change. A focus of nodularity measuring 14 mm within the reticular pattern (image 60, series 7). More linear band of consolidation measures 22 mm on image 43, series 07/07. Fine reticulation in the medial RIGHT upper lobe also RIGHT radiation. New nodularity in the lower lobes Musculoskeletal: No aggressive osseous lesion IMPRESSION: 1. New perihilar upper lobe reticulation consistent radiation therapy. 2. Interval decrease peribronchial thickening in the LEFT hilum. 3. New nodular densities in the LEFT upper lobe likely relates radiation therapy. Recommend attention on follow-up. Electronically Signed   By: Suzy Bouchard M.D.   On: 11/17/2017 09:10    Impression/Plan: 1. 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    He has recovered well from the effects of radiotherapy. Recent CT Chest shows stability in continued improvement in his disease. He remains on immunotherapy with Keytruda under the care and direction of Dr. Julien Nordmann and has a scheduled follow-up with Dr. Julien Nordmann on 12/10/17.  He will continue with routine disease surveillance with serial CT imaging of the chest under the direction of Dr. Julien Nordmann. He is comfortable with this plan. He knows to call with any questions or concerns related to his prior radiotherapy. 2. 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 Office nasopharyngoscopy demonstrates no active disease or concerning findings.  His recent CT soft-tissue neck on 11/17/17 shows a continued decrease in size of the left supraclavicular lymph node, near completely resolved.There is no new adenopathy or evidence for metastatic disease to the neck. There are post radiation changes involving the oropharynx and hypopharynx without evidence for residual or recurrent tumor at the level of the epiglottis.  Based on current NCCN guidelines, we will plan to see him back in the office every 3 months for fibreroptic nasopharyngoscopy for the first year, then  every 6 months for years 2-5 and annually thereafter. We plan to alternate the 3 month visits for fiberoptic nasopharyngoscopy with Dr. Redmond Baseman.  Recent TSH was WNL at 0.994 on 09/17/17.  We will continue to follow his TSH annually, as he is at an increased risk for hypothyroidism status-post neck irradiation.      Nicholos Johns, PA-C    Tyler Pita, MD  [facility] Direct Dial: [phone]  Fax: [phone] [email or web address]  Skype  LinkedIn

## 2017-12-04 ENCOUNTER — Telehealth: Payer: Self-pay | Admitting: Internal Medicine

## 2017-12-04 ENCOUNTER — Other Ambulatory Visit: Payer: Self-pay | Admitting: Family Medicine

## 2017-12-04 MED ORDER — OSELTAMIVIR PHOSPHATE 75 MG PO CAPS: 75.0000 mg | ORAL_CAPSULE | Freq: Every day | ORAL | 0 refills | Status: DC

## 2017-12-04 NOTE — Telephone Encounter (Signed)
Copied from [place]. Topic: Quick Communication - See Telephone Encounter >> Dec 04, 2017  9:51 AM Bea Graff, NT wrote: CRM for notification. See Telephone encounter for: Pts wife calling and she has been diagnosed with the flu, and they told her to have her husbands PCP call in some tamiful for him as well since he is a cancer pt. Please advise. Uses [facility] in [place]  12/04/17.

## 2017-12-04 NOTE — Telephone Encounter (Signed)
Faxed medical records to [facility]. Release HN#[id number]

## 2017-12-04 NOTE — Telephone Encounter (Signed)
Please advise 

## 2017-12-04 NOTE — Telephone Encounter (Signed)
Pt called to check status of request, contact to advise when possible, pt informed message was transferred to pcp

## 2017-12-04 NOTE — Progress Notes (Signed)
rx not done during office hours, called after hours about tamflu rx.   [facility] RN to notify pt that rx sent.  QD dosing if no sx, BID dosing and notify MD if sx start.  Reportedly has no sx at this point.   Routed to PCP.

## 2017-12-05 NOTE — Telephone Encounter (Signed)
Dr. Damita Dunnings sent TR

## 2017-12-10 ENCOUNTER — Inpatient Hospital Stay: Payer: Managed Care, Other (non HMO)

## 2017-12-10 ENCOUNTER — Encounter: Payer: Self-pay | Admitting: Internal Medicine

## 2017-12-10 ENCOUNTER — Encounter: Payer: Self-pay | Admitting: Medical Oncology

## 2017-12-10 ENCOUNTER — Inpatient Hospital Stay (HOSPITAL_BASED_OUTPATIENT_CLINIC_OR_DEPARTMENT_OTHER): Payer: Managed Care, Other (non HMO) | Admitting: Internal Medicine

## 2017-12-10 VITALS — BP 157/82 | HR 77 | Temp 98.3°F | Resp 18 | Ht 71.0 in | Wt 158.9 lb

## 2017-12-10 DIAGNOSIS — C321 Malignant neoplasm of supraglottis: Secondary | ICD-10-CM | POA: Diagnosis not present

## 2017-12-10 DIAGNOSIS — C3412 Malignant neoplasm of upper lobe, left bronchus or lung: Secondary | ICD-10-CM

## 2017-12-10 DIAGNOSIS — I10 Essential (primary) hypertension: Secondary | ICD-10-CM

## 2017-12-10 DIAGNOSIS — Z5112 Encounter for antineoplastic immunotherapy: Secondary | ICD-10-CM

## 2017-12-10 DIAGNOSIS — R5382 Chronic fatigue, unspecified: Secondary | ICD-10-CM

## 2017-12-10 LAB — COMPREHENSIVE METABOLIC PANEL
ALBUMIN: 3.6 g/dL (ref 3.5–5.0)
ALT: 6 U/L (ref 0–55)
ANION GAP: 8 (ref 3–11)
AST: 9 U/L (ref 5–34)
Alkaline Phosphatase: 86 U/L (ref 40–150)
BILIRUBIN TOTAL: 0.3 mg/dL (ref 0.2–1.2)
BUN: 15 mg/dL (ref 7–26)
CALCIUM: 9.1 mg/dL (ref 8.4–10.4)
CO2: 28 mmol/L (ref 22–29)
Chloride: 106 mmol/L (ref 98–109)
Creatinine, Ser: 0.72 mg/dL (ref 0.70–1.30)
Glucose, Bld: 94 mg/dL (ref 70–140)
POTASSIUM: 3.7 mmol/L (ref 3.5–5.1)
Sodium: 142 mmol/L (ref 136–145)
Total Protein: 6.1 g/dL — ABNORMAL LOW (ref 6.4–8.3)

## 2017-12-10 LAB — CBC WITH DIFFERENTIAL/PLATELET
BASOS PCT: 0 %
Basophils Absolute: 0 10*3/uL (ref 0.0–0.1)
Eosinophils Absolute: 0.1 10*3/uL (ref 0.0–0.5)
Eosinophils Relative: 1 %
HEMATOCRIT: 44.3 % (ref 38.4–49.9)
Hemoglobin: 14.8 g/dL (ref 13.0–17.1)
LYMPHS ABS: 0.6 10*3/uL — AB (ref 0.9–3.3)
Lymphocytes Relative: 12 %
MCH: 30.3 pg (ref 27.2–33.4)
MCHC: 33.4 g/dL (ref 32.0–36.0)
MCV: 90.6 fL (ref 79.3–98.0)
MONO ABS: 0.4 10*3/uL (ref 0.1–0.9)
MONOS PCT: 8 %
NEUTROS ABS: 4 10*3/uL (ref 1.5–6.5)
Neutrophils Relative %: 79 %
Platelets: 206 10*3/uL (ref 140–400)
RBC: 4.89 MIL/uL (ref 4.20–5.82)
RDW: 12.7 % (ref 11.0–14.6)
WBC: 5.1 10*3/uL (ref 4.0–10.3)

## 2017-12-10 LAB — TSH: TSH: 2.364 u[IU]/mL (ref 0.320–4.118)

## 2017-12-10 MED ORDER — HEPARIN SOD (PORK) LOCK FLUSH 100 UNIT/ML IV SOLN
500.0000 [IU] | Freq: Once | INTRAVENOUS | Status: AC | PRN
  Administered 2017-12-10: 500 [IU]
  Filled 2017-12-10: qty 5

## 2017-12-10 MED ORDER — SODIUM CHLORIDE 0.9% FLUSH
10.0000 mL | Freq: Once | INTRAVENOUS | Status: AC
  Administered 2017-12-10: 10 mL via INTRAVENOUS
  Filled 2017-12-10: qty 10

## 2017-12-10 MED ORDER — SODIUM CHLORIDE 0.9 % IV SOLN
200.0000 mg | Freq: Once | INTRAVENOUS | Status: AC
  Administered 2017-12-10: 200 mg via INTRAVENOUS
  Filled 2017-12-10: qty 8

## 2017-12-10 MED ORDER — SODIUM CHLORIDE 0.9 % IV SOLN
Freq: Once | INTRAVENOUS | Status: AC
  Administered 2017-12-10: 12:00:00 via INTRAVENOUS

## 2017-12-10 MED ORDER — SODIUM CHLORIDE 0.9% FLUSH
10.0000 mL | INTRAVENOUS | Status: DC | PRN
  Administered 2017-12-10: 10 mL
  Filled 2017-12-10: qty 10

## 2017-12-10 NOTE — Addendum Note (Signed)
Addended by: Ardeen Garland on: 12/10/2017 12:02 PM   Modules accepted: Orders

## 2017-12-10 NOTE — Progress Notes (Signed)
[facility] [phone]   [phone]) [phone]  OFFICE PROGRESS NOTE  [name], [name], MD [place] [address]  DIAGNOSIS:  1) Stage IIB/IV (T3, N3, M1a) non-small cell lung cancer, squamous cell carcinoma presented with large left hilar mass in addition to mediastinal and left supraclavicular lymphadenopathy as well as contralateral right upper lobe nodule diagnosed in August 2018. PDL 1 expression: 90%. 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 First dose 06/29/2017. Status post 6 cycles. Last dose was given 08/03/2017.  CURRENT THERAPY: Immunotherapy with Ketruda 200 MG IV every 3 weeks, first dose 09/17/2017.  Status post 4 cycles.  INTERVAL HISTORY: Trevor Zavala 52 y.o. male returns to the clinic today for follow-up visit.  The patient is currently on treatment with Keytruda 200 mg IV every 3 weeks status post 4 cycles and has been tolerating this treatment well.  He denied having any chest pain, shortness of breath, cough or hemoptysis.  He denied having any weight loss or night sweats.  He has no nausea, vomiting, diarrhea or constipation.  He is here today for evaluation before starting cycle #5.  The patient would like to go back to work on a light schedule.   MEDICAL HISTORY: Past Medical History:  Diagnosis Date  . Anxiety   . COPD (chronic obstructive pulmonary disease) ([facility])   . GERD (gastroesophageal reflux disease)   . Headache   . Hypertension   . Pneumonia   . Situational depression   . Stage III squamous cell carcinoma of left lung ([place]) 06/19/2017    ALLERGIES:  has No Known Allergies.  MEDICATIONS:  Current Outpatient Medications  Medication Sig Dispense Refill  . BREO ELLIPTA 100-25 MCG/INH AEPB INHALE 1 PUFF INTO THE LUNGS DAILY 60 each 11  . clonazePAM (KLONOPIN) 0.5 MG  tablet Take 1 tablet (0.5 mg total) by mouth 2 (two) times daily as needed for anxiety. 180 tablet 0  . docusate sodium (COLACE) 100 MG capsule Take 100 mg daily by mouth.    . esomeprazole (NEXIUM) 40 MG capsule TAKE 1 CAPSULE BY MOUTH DAILY WITH BREAKFAST 90 capsule 1  . lidocaine (XYLOCAINE) 2 % solution 5 ml po q 8 hours as needed for mouth and throat pain (Patient not taking: Reported on 11/26/2017) 100 mL 1  . lidocaine-prilocaine (EMLA) cream Apply 1 application topically as needed. (Patient not taking: Reported on 10/29/2017) 30 g 1  . loratadine (CLARITIN) 10 MG tablet Take 10 mg by mouth daily as needed for allergies.    . nicotine (NICODERM CQ) 21 mg/24hr patch Place 1 patch (21 mg total) onto the skin daily. 28 patch 0  . oseltamivir (TAMIFLU) 75 MG capsule Take 1 capsule (75 mg total) by mouth daily. 10 capsule 0  . potassium chloride SA (K-DUR,KLOR-CON) 20 MEQ tablet Take 1 tablet daily for 10 days. 10 tablet 0  . varenicline (CHANTIX STARTING MONTH PAK) 0.5 MG X 11 & 1 MG X 42 tablet Take one 0.5 mg tablet by mouth once daily for 3 days, then increase to one 0.5 mg tablet twice daily for 4 days, then increase to one 1 mg tablet twice daily. 53 tablet 0   No current facility-administered medications for this visit.     SURGICAL HISTORY:  Past Surgical History:  Procedure Laterality Date  . COLONOSCOPY    .  DIRECT LARYNGOSCOPY N/A 06/25/2017   Procedure: DIRECT LARYNGOSCOPY AND BIOPSY;  Surgeon: Rozetta Nunnery, MD;  Location: [facility];  Service: ENT;  Laterality: N/A;  . EAR CYST EXCISION N/A 11/17/2013   Procedure: SEBACEOUS CYST CHEST;  Surgeon: Joyice Faster. Cornett, MD;  Location: [facility];  Service: General;  Laterality: N/A;  . IR GASTROSTOMY TUBE MOD SED  08/10/2017  . IR GASTROSTOMY TUBE REMOVAL  09/29/2017  . KNEE ARTHROSCOPY     LEFT  . LIPOMA EXCISION N/A 11/17/2013   Procedure: EXCISION LIPOMA FOREHEAD;  Surgeon: Joyice Faster. Cornett, MD;   Location: [facility];  Service: General;  Laterality: N/A;  . LUNG BIOPSY Bilateral 06/12/2017   Procedure: LEFT LUNG BIOPSY;  Surgeon: Grace Isaac, MD;  Location: [facility];  Service: Thoracic;  Laterality: Bilateral;  . PORTACATH PLACEMENT Right 07/01/2017   Procedure: INSERTION PORT-A-CATH - RIGHT IJ - placed with Fluoro and Ultrasound;  Surgeon: Grace Isaac, MD;  Location: [facility];  Service: Thoracic;  Laterality: Right;  Marland Kitchen VIDEO BRONCHOSCOPY WITH ENDOBRONCHIAL ULTRASOUND N/A 06/12/2017   Procedure: VIDEO BRONCHOSCOPY WITH ENDOBRONCHIAL ULTRASOUND;  Surgeon: Grace Isaac, MD;  Location: [facility];  Service: Thoracic;  Laterality: N/A;    REVIEW OF SYSTEMS:  A comprehensive review of systems was negative.   PHYSICAL EXAMINATION: General appearance: alert, cooperative and no distress Head: Normocephalic, without obvious abnormality, atraumatic Neck: no adenopathy, no JVD, supple, symmetrical, trachea midline and thyroid not enlarged, symmetric, no tenderness/mass/nodules Lymph nodes: Cervical, supraclavicular, and axillary nodes normal. Resp: clear to auscultation bilaterally Back: symmetric, no curvature. ROM normal. No CVA tenderness. Cardio: regular rate and rhythm, S1, S2 normal, no murmur, click, rub or gallop GI: soft, non-tender; bowel sounds normal; no masses,  no organomegaly Extremities: extremities normal, atraumatic, no cyanosis or edema  ECOG PERFORMANCE STATUS: 1 - Symptomatic but completely ambulatory  Blood pressure (!) 157/82, pulse 77, temperature 98.3 F (36.8 C), temperature source Oral, resp. rate 18, height 5\' 11"  (1.803 m), weight 158 lb 14.4 oz (72.1 kg), SpO2 96 %.  LABORATORY DATA: Lab Results  Component Value Date   WBC 5.1 12/10/2017   HGB 14.8 12/10/2017   HCT 44.3 12/10/2017   MCV 90.6 12/10/2017   PLT 206 12/10/2017      Chemistry      Component Value Date/Time   NA 142 11/19/2017 0909   NA 141 10/29/2017 1049   K 3.3 (L)  11/19/2017 0909   K 3.3 (L) 10/29/2017 1049   CL 106 11/19/2017 0909   CO2 27 11/19/2017 0909   CO2 26 10/29/2017 1049   BUN 11 11/19/2017 0909   BUN 11.2 10/29/2017 1049   CREATININE 0.76 11/19/2017 0909   CREATININE 0.7 10/29/2017 1049   GLU 198 08/25/2017      Component Value Date/Time   CALCIUM 9.0 11/19/2017 0909   CALCIUM 9.0 10/29/2017 1049   ALKPHOS 73 11/19/2017 0909   ALKPHOS 82 10/29/2017 1049   AST 8 11/19/2017 0909   AST 10 10/29/2017 1049   ALT 8 11/19/2017 0909   ALT 8 10/29/2017 1049   BILITOT <0.2 (L) 11/19/2017 0909   BILITOT 0.22 10/29/2017 1049       RADIOGRAPHIC STUDIES: Ct Soft Tissue Neck W Contrast  Result Date: 11/17/2017 CLINICAL DATA:  Malignant neoplasm of the lung. Restaging. Stage IIB/IV non-small cell lung cancer with left supraclavicular lymphadenopathy. Squamous cell carcinoma of the epiglottis. EXAM: CT NECK WITH CONTRAST TECHNIQUE: Multidetector  CT imaging of the neck was performed using the standard protocol following the bolus administration of intravenous contrast. CONTRAST:  2mL ISOVUE-300 IOPAMIDOL (ISOVUE-300) INJECTION 61% COMPARISON:  PET scan 09/02/2017.  CT neck with contrast 08/07/2017. FINDINGS: Pharynx and larynx: Chronic mucosal thickening involving the epiglottis, oropharynx, and hypopharynx is compatible with prior radiation. No residual or recurrent mass lesion is present. The larynx is within normal limits. Vocal cords are midline and symmetric. Salivary glands: The submandibular and parotid glands are within normal limits bilaterally. Mild stranding about the left submandibular gland is likely related to radiation. Thyroid: Within normal limits. Lymph nodes: Previously noted left supraclavicular lymph node is smaller on today's study. Short axis measurement is now 3 mm. No other significant adenopathy is present. There are no new lymph nodes. Vascular: Mild stranding in the left carotid space likely reflects post radiation changes.  Atherosclerotic changes are present bilaterally. Asymmetry on the left may be due to radiation. There is no significant stenosis relative to the more distal vessels. Limited intracranial: Within normal limits. Visualized orbits: Unremarkable. Mastoids and visualized paranasal sinuses: The paranasal sinuses are clear. The right-sided mastoid air cells are clear. Left mastoidectomy is stable. Skeleton: Vertebral body heights and alignment are normal. No focal lytic or blastic lesions are present. Upper chest: The lung apices are clear. Please see dedicated CT of the chest same day. Mild postradiation changes are noted bilaterally. IMPRESSION: 1. Continued decrease in size of the left supraclavicular lymph node, near completely resolved. 2. No new adenopathy or evidence for metastatic disease to the neck. 3. Post radiation changes involving the oropharynx and hypopharynx without evidence for residual or recurrent tumor at the level of the epiglottis. Electronically Signed   By: San Morelle M.D.   On: 11/17/2017 09:46   Ct Chest W Contrast  Result Date: 11/17/2017 CLINICAL DATA:  Non-small cell lung cancer status post chemo radiation therapy EXAM: CT CHEST WITH CONTRAST TECHNIQUE: Multidetector CT imaging of the chest was performed during intravenous contrast administration. CONTRAST:  14mL ISOVUE-300 IOPAMIDOL (ISOVUE-300) INJECTION 61% COMPARISON:  PET-CT 01/03/2017 FINDINGS: Cardiovascular: No significant vascular findings. Normal heart size. No pericardial effusion. Mediastinum/Nodes: No axillary or supraclavicular adenopathy. Port in the RIGHT anterior chest wall. Lungs/Pleura: There is peribronchial thickening in the LEFT hilum improved from comparison exam. Upper Abdomen: Field reticulation and linear consolidation in the medial LEFT upper lobe consistent with radiation change. A focus of nodularity measuring 14 mm within the reticular pattern (image 60, series 7). More linear band of consolidation  measures 22 mm on image 43, series 07/07. Fine reticulation in the medial RIGHT upper lobe also RIGHT radiation. New nodularity in the lower lobes Musculoskeletal: No aggressive osseous lesion IMPRESSION: 1. New perihilar upper lobe reticulation consistent radiation therapy. 2. Interval decrease peribronchial thickening in the LEFT hilum. 3. New nodular densities in the LEFT upper lobe likely relates radiation therapy. Recommend attention on follow-up. Electronically Signed   By: Suzy Bouchard M.D.   On: 11/17/2017 09:10    ASSESSMENT AND PLAN: This is a very pleasant 52 years old white male with recently diagnosed stage IIIB/IV non-small cell lung cancer, adenocarcinoma presented with large left hilar mass in addition to mediastinal and left supraclavicular lymphadenopathy as well as suspicious right upper lobe pulmonary nodule diagnosed in August 2018. 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 He underwent a course of concurrent chemoradiation to the lung as well as the epiglottic area under the care of Dr. Tammi Klippel.  He is status post 6 cycle. He tolerated this course of treatment well except for the radiation induced esophagitis as well as weight loss and fatigue. The patient had partial response to the previous treatment. He is currently on treatment with Keytruda 200 mg IV every 3 weeks status post 4 cycles. The patient continues to tolerate this treatment fairly well with no concerning complaints.  I recommended for him to proceed with cycle #5 today as a scheduled. He will come back for follow-up visit in 3 weeks for evaluation before starting cycle #6. Regarding return to work, will give the patient a note to go back to work on a lighter schedule. He was advised to call immediately if he has any concerning symptoms in the interval. The patient voices understanding of current disease status and treatment options and is in agreement with the current  care plan. All questions were answered. The patient knows to call the clinic with any problems, questions or concerns. We can certainly see the patient much sooner if necessary. Disclaimer: This note was dictated with voice recognition software. Similar sounding words can inadvertently be transcribed and may not be corrected upon review.

## 2017-12-10 NOTE — Patient Instructions (Signed)

## 2017-12-10 NOTE — Patient Instructions (Signed)
[facility] Discharge Instructions for Patients Receiving Chemotherapy  Today you received the following chemotherapy agents:  Keytruda (pembrolizumab)  To help prevent nausea and vomiting after your treatment, we encourage you to take your nausea medication as prescribed.   If you develop nausea and vomiting that is not controlled by your nausea medication, call the clinic.   BELOW ARE SYMPTOMS THAT SHOULD BE REPORTED IMMEDIATELY:  *FEVER GREATER THAN 100.5 F  *CHILLS WITH OR WITHOUT FEVER  NAUSEA AND VOMITING THAT IS NOT CONTROLLED WITH YOUR NAUSEA MEDICATION  *UNUSUAL SHORTNESS OF BREATH  *UNUSUAL BRUISING OR BLEEDING  TENDERNESS IN MOUTH AND THROAT WITH OR WITHOUT PRESENCE OF ULCERS  *URINARY PROBLEMS  *BOWEL PROBLEMS  UNUSUAL RASH Items with * indicate a potential emergency and should be followed up as soon as possible.  Feel free to call the clinic should you have any questions or concerns. The clinic phone number is [phone].  Please show the [facility] at check-in to the Emergency Department and triage nurse.

## 2017-12-11 ENCOUNTER — Telehealth: Payer: Self-pay | Admitting: Internal Medicine

## 2017-12-11 NOTE — Telephone Encounter (Signed)
Scheduled appt per 1/31 los - pt to get an updated schedule next visit - also my chart active.

## 2017-12-18 ENCOUNTER — Telehealth: Payer: Self-pay | Admitting: Internal Medicine

## 2017-12-18 NOTE — Telephone Encounter (Signed)
12/18/17 @ 1:36 pm called patient and left vm confirming FMLA paperwork has been successfully faxed to [employer] @ [phone] on 12/11/17 @ 10:32 am.

## 2017-12-31 ENCOUNTER — Inpatient Hospital Stay: Payer: Managed Care, Other (non HMO) | Attending: Internal Medicine

## 2017-12-31 ENCOUNTER — Inpatient Hospital Stay: Payer: Managed Care, Other (non HMO)

## 2017-12-31 ENCOUNTER — Encounter: Payer: Self-pay | Admitting: Oncology

## 2017-12-31 ENCOUNTER — Inpatient Hospital Stay (HOSPITAL_BASED_OUTPATIENT_CLINIC_OR_DEPARTMENT_OTHER): Payer: Managed Care, Other (non HMO) | Admitting: Oncology

## 2017-12-31 VITALS — BP 128/71 | HR 84 | Temp 97.5°F | Resp 18 | Wt 157.1 lb

## 2017-12-31 DIAGNOSIS — Z9221 Personal history of antineoplastic chemotherapy: Secondary | ICD-10-CM | POA: Diagnosis not present

## 2017-12-31 DIAGNOSIS — C3412 Malignant neoplasm of upper lobe, left bronchus or lung: Secondary | ICD-10-CM

## 2017-12-31 DIAGNOSIS — Z923 Personal history of irradiation: Secondary | ICD-10-CM | POA: Insufficient documentation

## 2017-12-31 DIAGNOSIS — C321 Malignant neoplasm of supraglottis: Secondary | ICD-10-CM | POA: Insufficient documentation

## 2017-12-31 DIAGNOSIS — Z5112 Encounter for antineoplastic immunotherapy: Secondary | ICD-10-CM | POA: Insufficient documentation

## 2017-12-31 DIAGNOSIS — Z95828 Presence of other vascular implants and grafts: Secondary | ICD-10-CM

## 2017-12-31 DIAGNOSIS — R5382 Chronic fatigue, unspecified: Secondary | ICD-10-CM

## 2017-12-31 LAB — CBC WITH DIFFERENTIAL/PLATELET
BASOS ABS: 0 10*3/uL (ref 0.0–0.1)
Basophils Relative: 0 %
Eosinophils Absolute: 0.1 10*3/uL (ref 0.0–0.5)
Eosinophils Relative: 1 %
HEMATOCRIT: 43 % (ref 38.4–49.9)
HEMOGLOBIN: 14.7 g/dL (ref 13.0–17.1)
LYMPHS ABS: 0.6 10*3/uL — AB (ref 0.9–3.3)
LYMPHS PCT: 8 %
MCH: 30.9 pg (ref 27.2–33.4)
MCHC: 34.2 g/dL (ref 32.0–36.0)
MCV: 90.3 fL (ref 79.3–98.0)
MONO ABS: 0.5 10*3/uL (ref 0.1–0.9)
Monocytes Relative: 8 %
NEUTROS ABS: 5.7 10*3/uL (ref 1.5–6.5)
Neutrophils Relative %: 83 %
Platelets: 249 10*3/uL (ref 140–400)
RBC: 4.76 MIL/uL (ref 4.20–5.82)
RDW: 12.8 % (ref 11.0–14.6)
WBC: 6.8 10*3/uL (ref 4.0–10.3)
nRBC: 0 /100 WBC

## 2017-12-31 LAB — COMPREHENSIVE METABOLIC PANEL
ALBUMIN: 3.5 g/dL (ref 3.5–5.0)
ALK PHOS: 92 U/L (ref 40–150)
ALT: 10 U/L (ref 0–55)
AST: 9 U/L (ref 5–34)
Anion gap: 8 (ref 3–11)
BUN: 17 mg/dL (ref 7–26)
CALCIUM: 9.3 mg/dL (ref 8.4–10.4)
CO2: 26 mmol/L (ref 22–29)
Chloride: 105 mmol/L (ref 98–109)
Creatinine, Ser: 0.86 mg/dL (ref 0.70–1.30)
GFR calc Af Amer: >60 mL/min (ref >60)
GLUCOSE: 201 mg/dL — AB (ref 70–140)
Potassium: 4 mmol/L (ref 3.5–5.1)
Sodium: 139 mmol/L (ref 136–145)
TOTAL PROTEIN: 6.2 g/dL — AB (ref 6.4–8.3)
Total Bilirubin: 0.4 mg/dL (ref 0.2–1.2)

## 2017-12-31 LAB — TSH: TSH: 1.228 u[IU]/mL (ref 0.320–4.118)

## 2017-12-31 MED ORDER — SODIUM CHLORIDE 0.9% FLUSH
10.0000 mL | Freq: Once | INTRAVENOUS | Status: AC
  Administered 2017-12-31: 10 mL
  Filled 2017-12-31: qty 10

## 2017-12-31 MED ORDER — SODIUM CHLORIDE 0.9 % IV SOLN
200.0000 mg | Freq: Once | INTRAVENOUS | Status: AC
  Administered 2017-12-31: 200 mg via INTRAVENOUS
  Filled 2017-12-31: qty 8

## 2017-12-31 MED ORDER — HEPARIN SOD (PORK) LOCK FLUSH 100 UNIT/ML IV SOLN
500.0000 [IU] | Freq: Once | INTRAVENOUS | Status: AC | PRN
  Administered 2017-12-31: 500 [IU]
  Filled 2017-12-31: qty 5

## 2017-12-31 MED ORDER — SODIUM CHLORIDE 0.9 % IV SOLN
Freq: Once | INTRAVENOUS | Status: AC
  Administered 2017-12-31: 15:00:00 via INTRAVENOUS

## 2017-12-31 MED ORDER — SODIUM CHLORIDE 0.9% FLUSH
10.0000 mL | INTRAVENOUS | Status: DC | PRN
  Administered 2017-12-31: 10 mL
  Filled 2017-12-31: qty 10

## 2017-12-31 NOTE — Assessment & Plan Note (Signed)
This is a very pleasant 52 year old white male with recently diagnosed stage IIIB/IV non-small cell lung cancer, adenocarcinoma presented with large left hilar mass in addition to mediastinal and left supraclavicular lymphadenopathy as well as suspicious right upper lobe pulmonary nodule diagnosed in August 2018. 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 He underwent a course of concurrent chemoradiation to the lung as well as the epiglottic area under the care of Dr. Tammi Klippel. He is status post 6 cycle. He tolerated this course of treatment well except for the radiation induced esophagitis as well as weight loss and fatigue. The patient had partial response to the previous treatment. He is currently on treatment with Keytruda 200 mg IV every 3 weeks status post 5 cycles. The patient continues to tolerate this treatment fairly well with no concerning complaints.  I recommended for him to proceed with cycle #6 today as a scheduled. The patient will have restaging CT scans of the neck and chest prior to his next visit. He will come back for follow-up visit in 3 weeks for evaluation before starting cycle #7 and to review his restaging CT scan results.  He was advised to call immediately if he has any concerning symptoms in the interval. The patient voices understanding of current disease status and treatment options and is in agreement with the current care plan. All questions were answered. The patient knows to call the clinic with any problems, questions or concerns. We can certainly see the patient much sooner if necessary.

## 2017-12-31 NOTE — Progress Notes (Signed)
[facility] OFFICE PROGRESS NOTE  [name], [name], MD [place] [address]  DIAGNOSIS:  1) Stage IIB/IV (T3, N3, M1a) non-small cell lung cancer, squamous cell carcinoma presented with large left hilar mass in addition to mediastinal and left supraclavicular lymphadenopathy as well as contralateral right upper lobe nodule diagnosed in August 2018. PDL 1 expression: 90%. 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 First dose 06/29/2017. Status post 6 cycles. Last dose was given 08/03/2017.  CURRENT THERAPY: Immunotherapy with Keytruda 200 MG IV every 3 weeks, first dose 09/17/2017.  Status post 5 cycles.  INTERVAL HISTORY: Trevor Zavala 52 y.o. male returns for routine follow-up visit by himself.  The patient is feeling fine today with no specific complaints for bilateral shoulder pain which he has had for some time.  The patient is following with orthopedics this coming Monday.  The patient denies fevers and chills.  Denies chest pain, shortness of breath, cough, hemoptysis.  Denies nausea, vomiting, constipation, diarrhea.  He denies having any recent weight loss or night sweats.  The patient is here for evaluation prior to starting cycle #6 of his treatment.  MEDICAL HISTORY: Past Medical History:  Diagnosis Date  . Anxiety   . COPD (chronic obstructive pulmonary disease) ([place])   . GERD (gastroesophageal reflux disease)   . Headache   . Hypertension   . Pneumonia   . Situational depression   . Stage III squamous cell carcinoma of left lung ([place]) 06/19/2017    ALLERGIES:  has No Known Allergies.  MEDICATIONS:  Current Outpatient Medications  Medication Sig Dispense Refill  . BREO ELLIPTA 100-25 MCG/INH AEPB INHALE 1 PUFF INTO THE LUNGS DAILY 60 each 11  . clonazePAM (KLONOPIN) 0.5 MG tablet Take 1 tablet (0.5 mg  total) by mouth 2 (two) times daily as needed for anxiety. 180 tablet 0  . docusate sodium (COLACE) 100 MG capsule Take 100 mg daily by mouth.    . esomeprazole (NEXIUM) 40 MG capsule TAKE 1 CAPSULE BY MOUTH DAILY WITH BREAKFAST 90 capsule 1  . loratadine (CLARITIN) 10 MG tablet Take 10 mg by mouth daily as needed for allergies.    . nicotine (NICODERM CQ) 21 mg/24hr patch Place 1 patch (21 mg total) onto the skin daily. 28 patch 0   No current facility-administered medications for this visit.    Facility-Administered Medications Ordered in Other Visits  Medication Dose Route Frequency Provider Last Rate Last Dose  . heparin lock flush 100 unit/mL  500 Units Intracatheter Once PRN Curt Bears, MD      . pembrolizumab [facility]) 200 mg in sodium chloride 0.9 % 50 mL chemo infusion  200 mg Intravenous Once Curt Bears, MD 116 mL/hr at 12/31/17 1520 200 mg at 12/31/17 1520  . sodium chloride flush (NS) 0.9 % injection 10 mL  10 mL Intracatheter PRN Curt Bears, MD        SURGICAL HISTORY:  Past Surgical History:  Procedure Laterality Date  . COLONOSCOPY    . DIRECT LARYNGOSCOPY N/A 06/25/2017   Procedure: DIRECT LARYNGOSCOPY AND BIOPSY;  Surgeon: Rozetta Nunnery, MD;  Location: [place];  Service: ENT;  Laterality: N/A;  . EAR CYST EXCISION N/A 11/17/2013   Procedure: SEBACEOUS CYST CHEST;  Surgeon: Joyice Faster. Cornett, MD;  Location: [facility];  Service: General;  Laterality: N/A;  .  IR GASTROSTOMY TUBE MOD SED  08/10/2017  . IR GASTROSTOMY TUBE REMOVAL  09/29/2017  . KNEE ARTHROSCOPY     LEFT  . LIPOMA EXCISION N/A 11/17/2013   Procedure: EXCISION LIPOMA FOREHEAD;  Surgeon: Joyice Faster. Cornett, MD;  Location: [place];  Service: General;  Laterality: N/A;  . LUNG BIOPSY Bilateral 06/12/2017   Procedure: LEFT LUNG BIOPSY;  Surgeon: Grace Isaac, MD;  Location: [place];  Service: Thoracic;  Laterality: Bilateral;  . PORTACATH  PLACEMENT Right 07/01/2017   Procedure: INSERTION PORT-A-CATH - RIGHT IJ - placed with Fluoro and Ultrasound;  Surgeon: Grace Isaac, MD;  Location: [facility];  Service: Thoracic;  Laterality: Right;  Marland Kitchen VIDEO BRONCHOSCOPY WITH ENDOBRONCHIAL ULTRASOUND N/A 06/12/2017   Procedure: VIDEO BRONCHOSCOPY WITH ENDOBRONCHIAL ULTRASOUND;  Surgeon: Grace Isaac, MD;  Location: [facility];  Service: Thoracic;  Laterality: N/A;    REVIEW OF SYSTEMS:   Review of Systems  Constitutional: Negative for appetite change, chills, fatigue, fever and unexpected weight change.  HENT:   Negative for mouth sores, nosebleeds, sore throat and trouble swallowing.   Eyes: Negative for eye problems and icterus.  Respiratory: Negative for cough, hemoptysis, shortness of breath and wheezing.   Cardiovascular: Negative for chest pain and leg swelling.  Gastrointestinal: Negative for abdominal pain, constipation, diarrhea, nausea and vomiting.  Genitourinary: Negative for bladder incontinence, difficulty urinating, dysuria, frequency and hematuria.   Musculoskeletal: Negative for back pain, gait problem, neck pain and neck stiffness.  Positive for bilateral shoulder pain.  Skin: Negative for itching and rash.  Neurological: Negative for dizziness, extremity weakness, gait problem, headaches, light-headedness and seizures.  Hematological: Negative for adenopathy. Does not bruise/bleed easily.  Psychiatric/Behavioral: Negative for confusion, depression and sleep disturbance. The patient is not nervous/anxious.     PHYSICAL EXAMINATION:  Blood pressure 128/71, pulse 84, temperature (!) 97.5 F (36.4 C), temperature source Oral, resp. rate 18, weight 157 lb 1.6 oz (71.3 kg), SpO2 98 %.  ECOG PERFORMANCE STATUS: 1 - Symptomatic but completely ambulatory  Physical Exam  Constitutional: Oriented to person, place, and time and well-developed, well-nourished, and in no distress. No distress.  HENT:  Head: Normocephalic and  atraumatic.  Mouth/Throat: Oropharynx is clear and moist. No oropharyngeal exudate.  Eyes: Conjunctivae are normal. Right eye exhibits no discharge. Left eye exhibits no discharge. No scleral icterus.  Neck: Normal range of motion. Neck supple.  Cardiovascular: Normal rate, regular rhythm, normal heart sounds and intact distal pulses.   Pulmonary/Chest: Effort normal and breath sounds normal. No respiratory distress. No wheezes. No rales.  Abdominal: Soft. Bowel sounds are normal. Exhibits no distension and no mass. There is no tenderness.  Musculoskeletal: Normal range of motion. Exhibits no edema.  Lymphadenopathy:    No cervical adenopathy.  Neurological: Alert and oriented to person, place, and time. Exhibits normal muscle tone. Gait normal. Coordination normal.  Skin: Skin is warm and dry. No rash noted. Not diaphoretic. No erythema. No pallor.  Psychiatric: Mood, memory and judgment normal.  Vitals reviewed.  LABORATORY DATA: Lab Results  Component Value Date   WBC 6.8 12/31/2017   HGB 14.7 12/31/2017   HCT 43.0 12/31/2017   MCV 90.3 12/31/2017   PLT 249 12/31/2017      Chemistry      Component Value Date/Time   NA 139 12/31/2017 1158   NA 141 10/29/2017 1049   K 4.0 12/31/2017 1158   K 3.3 (L) 10/29/2017 1049   CL 105 12/31/2017 1158  CO2 26 12/31/2017 1158   CO2 26 10/29/2017 1049   BUN 17 12/31/2017 1158   BUN 11.2 10/29/2017 1049   CREATININE 0.86 12/31/2017 1158   CREATININE 0.7 10/29/2017 1049   GLU 198 08/25/2017      Component Value Date/Time   CALCIUM 9.3 12/31/2017 1158   CALCIUM 9.0 10/29/2017 1049   ALKPHOS 92 12/31/2017 1158   ALKPHOS 82 10/29/2017 1049   AST 9 12/31/2017 1158   AST 10 10/29/2017 1049   ALT 10 12/31/2017 1158   ALT 8 10/29/2017 1049   BILITOT 0.4 12/31/2017 1158   BILITOT 0.22 10/29/2017 1049       RADIOGRAPHIC STUDIES:  No results found.   ASSESSMENT/PLAN:  Squamous cell carcinoma of epiglottis [facility]) This is a very  pleasant 52 year old white male with recently diagnosed stage IIIB/IV non-small cell lung cancer, adenocarcinoma presented with large left hilar mass in addition to mediastinal and left supraclavicular lymphadenopathy as well as suspicious right upper lobe pulmonary nodule diagnosed in August 2018. 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 He underwent a course of concurrent chemoradiation to the lung as well as the epiglottic area under the care of Dr. Tammi Klippel. He is status post 6 cycle. He tolerated this course of treatment well except for the radiation induced esophagitis as well as weight loss and fatigue. The patient had partial response to the previous treatment. He is currently on treatment with Keytruda 200 mg IV every 3 weeks status post 5 cycles. The patient continues to tolerate this treatment fairly well with no concerning complaints.  I recommended for him to proceed with cycle #6 today as a scheduled. The patient will have restaging CT scans of the neck and chest prior to his next visit. He will come back for follow-up visit in 3 weeks for evaluation before starting cycle #7 and to review his restaging CT scan results.  He was advised to call immediately if he has any concerning symptoms in the interval. The patient voices understanding of current disease status and treatment options and is in agreement with the current care plan. All questions were answered. The patient knows to call the clinic with any problems, questions or concerns. We can certainly see the patient much sooner if necessary.  Orders Placed This Encounter  Procedures  . CT CHEST W CONTRAST    Standing Status:   Future    Standing Expiration Date:   12/31/2018    Order Specific Question:   If indicated for the ordered procedure, I authorize the administration of contrast media per Radiology protocol    Answer:   Yes    Order Specific Question:   Preferred imaging  location?    Answer:   [facility]    Order Specific Question:   Radiology Contrast Protocol - do NOT remove file path    Answer:   \\[email or web address]    Order Specific Question:   Reason for Exam additional comments    Answer:   Squamous cell carcinoma of the lung and epoglottis. Restaging.  . CT Soft Tissue Neck W Contrast    Standing Status:   Future    Standing Expiration Date:   12/31/2018    Order Specific Question:   If indicated for the ordered procedure, I authorize the administration of contrast media per Radiology protocol    Answer:   Yes    Order Specific Question:   Preferred imaging location?    Answer:  [facility]    Order Specific Question:   Radiology Contrast Protocol - do NOT remove file path    Answer:   \\[email or web address]    Order Specific Question:   Reason for Exam additional comments    Answer:   Squamous cell carcinoma of the lung and epoglottis. Restaging.    Mikey Bussing, DNP, AGPCNP-BC, AOCNP 12/31/17

## 2017-12-31 NOTE — Patient Instructions (Signed)
[facility] Discharge Instructions for Patients Receiving Chemotherapy  Today you received the following chemotherapy agents:  Keytruda (pembrolizumab)  To help prevent nausea and vomiting after your treatment, we encourage you to take your nausea medication as prescribed.   If you develop nausea and vomiting that is not controlled by your nausea medication, call the clinic.   BELOW ARE SYMPTOMS THAT SHOULD BE REPORTED IMMEDIATELY:  *FEVER GREATER THAN 100.5 F  *CHILLS WITH OR WITHOUT FEVER  NAUSEA AND VOMITING THAT IS NOT CONTROLLED WITH YOUR NAUSEA MEDICATION  *UNUSUAL SHORTNESS OF BREATH  *UNUSUAL BRUISING OR BLEEDING  TENDERNESS IN MOUTH AND THROAT WITH OR WITHOUT PRESENCE OF ULCERS  *URINARY PROBLEMS  *BOWEL PROBLEMS  UNUSUAL RASH Items with * indicate a potential emergency and should be followed up as soon as possible.  Feel free to call the clinic should you have any questions or concerns. The clinic phone number is [phone].  Please show the [facility] at check-in to the Emergency Department and triage nurse.

## 2018-01-01 ENCOUNTER — Telehealth: Payer: Self-pay | Admitting: Oncology

## 2018-01-01 NOTE — Telephone Encounter (Signed)
3 cycles already scheduled per 2/21 los -

## 2018-01-11 ENCOUNTER — Other Ambulatory Visit: Payer: Self-pay | Admitting: Internal Medicine

## 2018-01-12 ENCOUNTER — Other Ambulatory Visit: Payer: Self-pay | Admitting: Internal Medicine

## 2018-01-16 ENCOUNTER — Encounter: Payer: Self-pay | Admitting: Gastroenterology

## 2018-01-19 ENCOUNTER — Ambulatory Visit (HOSPITAL_COMMUNITY)
Admission: RE | Admit: 2018-01-19 | Discharge: 2018-01-19 | Disposition: A | Discharge status: Home / Self Care | Payer: Managed Care, Other (non HMO) | Source: Ambulatory Visit | Attending: Oncology | Admitting: Oncology

## 2018-01-19 ENCOUNTER — Encounter (HOSPITAL_COMMUNITY): Payer: Self-pay

## 2018-01-19 DIAGNOSIS — C321 Malignant neoplasm of supraglottis: Secondary | ICD-10-CM | POA: Diagnosis not present

## 2018-01-19 DIAGNOSIS — I700 Atherosclerosis of aorta: Secondary | ICD-10-CM | POA: Insufficient documentation

## 2018-01-19 DIAGNOSIS — C3412 Malignant neoplasm of upper lobe, left bronchus or lung: Secondary | ICD-10-CM | POA: Insufficient documentation

## 2018-01-19 DIAGNOSIS — J439 Emphysema, unspecified: Secondary | ICD-10-CM | POA: Diagnosis not present

## 2018-01-19 MED ORDER — HEPARIN SOD (PORK) LOCK FLUSH 100 UNIT/ML IV SOLN
INTRAVENOUS | Status: AC
  Filled 2018-01-19: qty 5

## 2018-01-19 MED ORDER — HEPARIN SOD (PORK) LOCK FLUSH 100 UNIT/ML IV SOLN
500.0000 [IU] | Freq: Once | INTRAVENOUS | Status: AC
  Administered 2018-01-19: 500 [IU] via INTRAVENOUS

## 2018-01-19 MED ORDER — IOPAMIDOL (ISOVUE-300) INJECTION 61%
INTRAVENOUS | Status: AC
  Filled 2018-01-19: qty 75

## 2018-01-19 MED ORDER — IOPAMIDOL (ISOVUE-300) INJECTION 61%
75.0000 mL | Freq: Once | INTRAVENOUS | Status: AC | PRN
  Administered 2018-01-19: 75 mL via INTRAVENOUS

## 2018-01-19 MED ORDER — SODIUM CHLORIDE 0.9 % IJ SOLN
INTRAMUSCULAR | Status: AC
  Filled 2018-01-19: qty 50

## 2018-01-21 ENCOUNTER — Telehealth: Payer: Self-pay | Admitting: Internal Medicine

## 2018-01-21 ENCOUNTER — Inpatient Hospital Stay: Payer: Managed Care, Other (non HMO)

## 2018-01-21 ENCOUNTER — Encounter: Payer: Self-pay | Admitting: Internal Medicine

## 2018-01-21 ENCOUNTER — Inpatient Hospital Stay (HOSPITAL_BASED_OUTPATIENT_CLINIC_OR_DEPARTMENT_OTHER): Payer: Managed Care, Other (non HMO) | Admitting: Internal Medicine

## 2018-01-21 ENCOUNTER — Inpatient Hospital Stay: Payer: Managed Care, Other (non HMO) | Attending: Internal Medicine

## 2018-01-21 VITALS — BP 137/74 | HR 89 | Temp 98.6°F | Resp 20 | Ht 71.0 in | Wt 153.7 lb

## 2018-01-21 DIAGNOSIS — F419 Anxiety disorder, unspecified: Secondary | ICD-10-CM | POA: Insufficient documentation

## 2018-01-21 DIAGNOSIS — K219 Gastro-esophageal reflux disease without esophagitis: Secondary | ICD-10-CM | POA: Insufficient documentation

## 2018-01-21 DIAGNOSIS — Z79899 Other long term (current) drug therapy: Secondary | ICD-10-CM | POA: Insufficient documentation

## 2018-01-21 DIAGNOSIS — C321 Malignant neoplasm of supraglottis: Secondary | ICD-10-CM | POA: Insufficient documentation

## 2018-01-21 DIAGNOSIS — I10 Essential (primary) hypertension: Secondary | ICD-10-CM | POA: Insufficient documentation

## 2018-01-21 DIAGNOSIS — R5382 Chronic fatigue, unspecified: Secondary | ICD-10-CM

## 2018-01-21 DIAGNOSIS — Z5112 Encounter for antineoplastic immunotherapy: Secondary | ICD-10-CM | POA: Diagnosis present

## 2018-01-21 DIAGNOSIS — Z9221 Personal history of antineoplastic chemotherapy: Secondary | ICD-10-CM | POA: Diagnosis not present

## 2018-01-21 DIAGNOSIS — J449 Chronic obstructive pulmonary disease, unspecified: Secondary | ICD-10-CM | POA: Insufficient documentation

## 2018-01-21 DIAGNOSIS — C3412 Malignant neoplasm of upper lobe, left bronchus or lung: Secondary | ICD-10-CM | POA: Insufficient documentation

## 2018-01-21 DIAGNOSIS — Z923 Personal history of irradiation: Secondary | ICD-10-CM | POA: Insufficient documentation

## 2018-01-21 DIAGNOSIS — R490 Dysphonia: Secondary | ICD-10-CM

## 2018-01-21 LAB — COMPREHENSIVE METABOLIC PANEL
ALT: 20 U/L (ref 0–55)
AST: 11 U/L (ref 5–34)
Albumin: 3.7 g/dL (ref 3.5–5.0)
Alkaline Phosphatase: 105 U/L (ref 40–150)
Anion gap: 10 (ref 3–11)
BUN: 13 mg/dL (ref 7–26)
CHLORIDE: 106 mmol/L (ref 98–109)
CO2: 25 mmol/L (ref 22–29)
CREATININE: 0.73 mg/dL (ref 0.70–1.30)
Calcium: 9.5 mg/dL (ref 8.4–10.4)
GFR calc Af Amer: >60 mL/min (ref >60)
GLUCOSE: 126 mg/dL (ref 70–140)
Potassium: 4.1 mmol/L (ref 3.5–5.1)
Sodium: 141 mmol/L (ref 136–145)
Total Bilirubin: 0.7 mg/dL (ref 0.2–1.2)
Total Protein: 6.6 g/dL (ref 6.4–8.3)

## 2018-01-21 LAB — CBC WITH DIFFERENTIAL/PLATELET
Basophils Absolute: 0 10*3/uL (ref 0.0–0.1)
Basophils Relative: 0 %
EOS ABS: 0.1 10*3/uL (ref 0.0–0.5)
EOS PCT: 1 %
HCT: 46.2 % (ref 38.4–49.9)
Hemoglobin: 15.5 g/dL (ref 13.0–17.1)
Lymphocytes Relative: 7 %
Lymphs Abs: 0.7 10*3/uL — ABNORMAL LOW (ref 0.9–3.3)
MCH: 31.3 pg (ref 27.2–33.4)
MCHC: 33.5 g/dL (ref 32.0–36.0)
MCV: 93.1 fL (ref 79.3–98.0)
MONO ABS: 0.6 10*3/uL (ref 0.1–0.9)
MONOS PCT: 6 %
Neutro Abs: 8.6 10*3/uL — ABNORMAL HIGH (ref 1.5–6.5)
Neutrophils Relative %: 86 %
PLATELETS: 205 10*3/uL (ref 140–400)
RBC: 4.96 MIL/uL (ref 4.20–5.82)
RDW: 13.6 % (ref 11.0–14.6)
WBC: 9.9 10*3/uL (ref 4.0–10.3)

## 2018-01-21 LAB — TSH: TSH: 2.931 u[IU]/mL (ref 0.320–4.118)

## 2018-01-21 MED ORDER — SODIUM CHLORIDE 0.9 % IV SOLN
200.0000 mg | Freq: Once | INTRAVENOUS | Status: AC
  Administered 2018-01-21: 200 mg via INTRAVENOUS
  Filled 2018-01-21: qty 8

## 2018-01-21 MED ORDER — HEPARIN SOD (PORK) LOCK FLUSH 100 UNIT/ML IV SOLN
500.0000 [IU] | Freq: Once | INTRAVENOUS | Status: AC | PRN
  Administered 2018-01-21: 500 [IU]
  Filled 2018-01-21: qty 5

## 2018-01-21 MED ORDER — SODIUM CHLORIDE 0.9 % IV SOLN
Freq: Once | INTRAVENOUS | Status: AC
Start: 2018-01-21 — End: 2018-01-21
  Administered 2018-01-21: 10:00:00 via INTRAVENOUS

## 2018-01-21 MED ORDER — SODIUM CHLORIDE 0.9% FLUSH
10.0000 mL | INTRAVENOUS | Status: DC | PRN
  Administered 2018-01-21: 10 mL
  Filled 2018-01-21: qty 10

## 2018-01-21 NOTE — Progress Notes (Signed)
[place] [phone]   [phone]  OFFICE PROGRESS NOTE  [name], [name], MD [place] [address]  DIAGNOSIS:  1) Stage IIB/IV (T3, N3, M1a) non-small cell lung cancer, squamous cell carcinoma presented with large left hilar mass in addition to mediastinal and left supraclavicular lymphadenopathy as well as contralateral right upper lobe nodule diagnosed in August 2018. PDL 1 expression: 90%. 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 First dose 06/29/2017. Status post 6 cycles. Last dose was given 08/03/2017.  CURRENT THERAPY: Immunotherapy with Ketruda 200 MG IV every 3 weeks, first dose 09/17/2017.  Status post 6 cycles.  INTERVAL HISTORY: Trevor Zavala 52 y.o. male returns to the clinic today for follow-up visit accompanied by his wife.  The patient is feeling fine today with no specific complaints except for hoarseness of his voice after cold symptoms last week.  He denied having any chest pain, shortness breath, cough or hemoptysis.  He denied having any fever or chills.  He has no nausea, vomiting, diarrhea or constipation.  The patient denied having any significant weight loss or night sweats.  He is back to work at regular basis.  He is tolerating his treatment with immunotherapy fairly well.  The patient had repeat CT scan of the neck and chest performed recently and he is here for evaluation and discussion of his discuss results.  MEDICAL HISTORY: Past Medical History:  Diagnosis Date  . Anxiety   . COPD (chronic obstructive pulmonary disease) ([place])   . GERD (gastroesophageal reflux disease)   . Headache   . Hypertension   . Pneumonia   . Situational depression   . Stage III squamous cell carcinoma of left lung ([place]) 06/19/2017    ALLERGIES:  has No Known Allergies.  MEDICATIONS:   Current Outpatient Medications  Medication Sig Dispense Refill  . BREO ELLIPTA 100-25 MCG/INH AEPB INHALE 1 PUFF INTO THE LUNGS DAILY. ANNUAL APPT IS DUE MUST SEE PROVIDER FOR FUTURE REFILLS 60 each 11  . clonazePAM (KLONOPIN) 0.5 MG tablet Take 1 tablet (0.5 mg total) by mouth 2 (two) times daily as needed for anxiety. 180 tablet 0  . docusate sodium (COLACE) 100 MG capsule Take 100 mg daily by mouth.    . esomeprazole (NEXIUM) 40 MG capsule TAKE 1 CAPSULE BY MOUTH DAILY WITH BREAKFAST 90 capsule 1  . loratadine (CLARITIN) 10 MG tablet Take 10 mg by mouth daily as needed for allergies.    . nicotine (NICODERM CQ) 21 mg/24hr patch Place 1 patch (21 mg total) onto the skin daily. 28 patch 0   No current facility-administered medications for this visit.     SURGICAL HISTORY:  Past Surgical History:  Procedure Laterality Date  . COLONOSCOPY    . DIRECT LARYNGOSCOPY N/A 06/25/2017   Procedure: DIRECT LARYNGOSCOPY AND BIOPSY;  Surgeon: Rozetta Nunnery, MD;  Location: [place];  Service: ENT;  Laterality: N/A;  . EAR CYST EXCISION N/A 11/17/2013   Procedure: SEBACEOUS CYST CHEST;  Surgeon: Joyice Faster. Cornett, MD;  Location: [place];  Service: General;  Laterality: N/A;  . IR GASTROSTOMY TUBE MOD SED  08/10/2017  . IR GASTROSTOMY TUBE REMOVAL  09/29/2017  . KNEE ARTHROSCOPY     LEFT  . LIPOMA EXCISION N/A 11/17/2013   Procedure: EXCISION LIPOMA FOREHEAD;  Surgeon:  Thomas A. Cornett, MD;  Location: [place];  Service: General;  Laterality: N/A;  . LUNG BIOPSY Bilateral 06/12/2017   Procedure: LEFT LUNG BIOPSY;  Surgeon: Grace Isaac, MD;  Location: [place];  Service: Thoracic;  Laterality: Bilateral;  . PORTACATH PLACEMENT Right 07/01/2017   Procedure: INSERTION PORT-A-CATH - RIGHT IJ - placed with Fluoro and Ultrasound;  Surgeon: Grace Isaac, MD;  Location: [place];  Service: Thoracic;  Laterality: Right;  Marland Kitchen VIDEO BRONCHOSCOPY WITH  ENDOBRONCHIAL ULTRASOUND N/A 06/12/2017   Procedure: VIDEO BRONCHOSCOPY WITH ENDOBRONCHIAL ULTRASOUND;  Surgeon: Grace Isaac, MD;  Location: [facility];  Service: Thoracic;  Laterality: N/A;    REVIEW OF SYSTEMS:  Constitutional: negative Eyes: negative Ears, nose, mouth, throat, and face: positive for hoarseness Respiratory: negative Cardiovascular: negative Gastrointestinal: negative Genitourinary:negative Integument/breast: negative Hematologic/lymphatic: negative Musculoskeletal:negative Neurological: negative Behavioral/Psych: negative Endocrine: negative Allergic/Immunologic: negative   PHYSICAL EXAMINATION: General appearance: alert, cooperative and no distress Head: Normocephalic, without obvious abnormality, atraumatic Neck: no adenopathy, no JVD, supple, symmetrical, trachea midline and thyroid not enlarged, symmetric, no tenderness/mass/nodules Lymph nodes: Cervical, supraclavicular, and axillary nodes normal. Resp: clear to auscultation bilaterally Back: symmetric, no curvature. ROM normal. No CVA tenderness. Cardio: regular rate and rhythm, S1, S2 normal, no murmur, click, rub or gallop GI: soft, non-tender; bowel sounds normal; no masses,  no organomegaly Extremities: extremities normal, atraumatic, no cyanosis or edema Neurologic: Alert and oriented X 3, normal strength and tone. Normal symmetric reflexes. Normal coordination and gait  ECOG PERFORMANCE STATUS: 1 - Symptomatic but completely ambulatory  Blood pressure 137/74, pulse 89, temperature 98.6 F (37 C), temperature source Oral, resp. rate 20, height 5\' 11"  (1.803 m), weight 153 lb 11.2 oz (69.7 kg), SpO2 97 %.  LABORATORY DATA: Lab Results  Component Value Date   WBC 9.9 01/21/2018   HGB 15.5 01/21/2018   HCT 46.2 01/21/2018   MCV 93.1 01/21/2018   PLT 205 01/21/2018      Chemistry      Component Value Date/Time   NA 139 12/31/2017 1158   NA 141 10/29/2017 1049   K 4.0 12/31/2017 1158   K 3.3  (L) 10/29/2017 1049   CL 105 12/31/2017 1158   CO2 26 12/31/2017 1158   CO2 26 10/29/2017 1049   BUN 17 12/31/2017 1158   BUN 11.2 10/29/2017 1049   CREATININE 0.86 12/31/2017 1158   CREATININE 0.7 10/29/2017 1049   GLU 198 08/25/2017      Component Value Date/Time   CALCIUM 9.3 12/31/2017 1158   CALCIUM 9.0 10/29/2017 1049   ALKPHOS 92 12/31/2017 1158   ALKPHOS 82 10/29/2017 1049   AST 9 12/31/2017 1158   AST 10 10/29/2017 1049   ALT 10 12/31/2017 1158   ALT 8 10/29/2017 1049   BILITOT 0.4 12/31/2017 1158   BILITOT 0.22 10/29/2017 1049       RADIOGRAPHIC STUDIES: Ct Soft Tissue Neck W Contrast  Result Date: 01/19/2018 CLINICAL DATA:  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 Sore throat. EXAM: CT NECK WITH CONTRAST TECHNIQUE: Multidetector CT imaging of the neck was performed using the standard protocol following the bolus administration of intravenous contrast. CONTRAST:  65mL ISOVUE-300 IOPAMIDOL (ISOVUE-300) INJECTION 61% COMPARISON:  11/17/2017.  08/07/2017. FINDINGS: Pharynx and larynx: No sign of identifiable residual or recurrent mass lesion. Continued radiation changes in the region of the hypopharynx, supraglottic and glottic region with edema, though this is improving over  time. Salivary glands: Parotid and submandibular glands are normal. Thyroid: Normal Lymph nodes: Previously seen left supraclavicular node is normal an unremarkable presently. No other new or enlarging nodes. Vascular: Atherosclerotic calcification of the carotid bifurcation regions. Right internal jugular catheter. No significant finding. Limited intracranial: Normal Visualized orbits: Normal Mastoids and visualized paranasal sinuses: Clear. Previous mastoidectomy on the left. Skeleton: Otherwise negative. Upper chest: See results of concurrent chest CT. Other: None significant IMPRESSION: No evidence of residual or recurrent disease. Improving  post radiation edema of the hypopharynx, supraglottic and glottic region. No abnormal lymph nodes today. Previously seen left supraclavicular node is now tiny and within normal limits. Electronically Signed   By: Nelson Chimes M.D.   On: 01/19/2018 07:47   Ct Chest W Contrast  Result Date: 01/19/2018 CLINICAL DATA:  Squamous cell carcinoma left lung. On going immunotherapy. Completed chemotherapy and radiation therapy October 2018. EXAM: CT CHEST WITH CONTRAST TECHNIQUE: Multidetector CT imaging of the chest was performed during intravenous contrast administration. CONTRAST:  31mL ISOVUE-300 IOPAMIDOL (ISOVUE-300) INJECTION 61% COMPARISON:  11/17/2017 FINDINGS: Cardiovascular: The heart is normal in size and stable. No pericardial effusion. Stable mild tortuosity of the thoracic aorta and a few scattered atherosclerotic calcifications. No aneurysm or dissection. No definite coronary artery calcifications. Mediastinum/Nodes: Stable matted soft tissue density in the left hilum and mediastinum likely treated tumor. No findings suspicious for recurrent tumor. The esophagus is grossly normal. Lungs/Pleura: Stable radiation changes involving the left hilum and paramediastinal lung. Stable soft tissue thickening and narrowing along the left upper lobe bronchus. The nodularity described in the left upper lobe on the prior study has resolved and was likely radiation change. No new worrisome pulmonary nodules to suggest metastatic disease. No pleural effusions. Upper Abdomen: No significant upper abdominal findings. No evidence of hepatic or adrenal gland metastasis. Musculoskeletal: No significant bony findings. IMPRESSION: 1. Stable matted soft tissue density in the left hilum and mediastinum and surrounding the left upper lobe bronchus consistent with treated tumor. No new or progressive findings are identified. 2. Resolution of nodular densities seen on the prior CT scan in the left upper lobe. Stable radiation  changes. 3. No new or worrisome pulmonary lesions/nodules. 4. Stable underlying emphysematous changes. Aortic Atherosclerosis (ICD10-I70.0) and Emphysema (ICD10-J43.9). Electronically Signed   By: Marijo Sanes M.D.   On: 01/19/2018 10:11    ASSESSMENT AND PLAN: This is a very pleasant 52 years old white male with recently diagnosed stage IIIB/IV non-small cell lung cancer, adenocarcinoma presented with large left hilar mass in addition to mediastinal and left supraclavicular lymphadenopathy as well as suspicious right upper lobe pulmonary nodule diagnosed in August 2018. 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 He underwent a course of concurrent chemoradiation to the lung as well as the epiglottic area under the care of Dr. Tammi Klippel. He is status post 6 cycle. He tolerated this course of treatment well except for the radiation induced esophagitis as well as weight loss and fatigue. The patient had partial response to the previous treatment. He is currently on treatment with Keytruda 200 mg IV every 3 weeks status post 6 cycles. He is tolerating his current treatment with immunotherapy fairly well with no concerning complaints.  Repeat CT scan of the neck and chest showed no concerning findings for disease progression.  I personally and independently reviewed the scan and I discussed the scan results with the patient and his wife. I recommended for him to continue his current treatment  with Keytruda and he will proceed with cycle #7 today. For the hoarseness of his voice, the patient will have an appointment with Dr. Redmond Baseman for evaluation of this condition. He will come back for follow-up visit in 3 weeks for evaluation before cycle #8 of his treatment. He was advised to call immediately if he has any concerning symptoms in the interval. The patient voices understanding of current disease status and treatment options and is in agreement with the current care  plan. All questions were answered. The patient knows to call the clinic with any problems, questions or concerns. We can certainly see the patient much sooner if necessary. Disclaimer: This note was dictated with voice recognition software. Similar sounding words can inadvertently be transcribed and may not be corrected upon review.

## 2018-01-21 NOTE — Telephone Encounter (Signed)
Scheduled appt per 3/14 los - Gave patient AVS and calender

## 2018-01-21 NOTE — Patient Instructions (Signed)
[facility] Discharge Instructions for Patients Receiving Chemotherapy  Today you received the following chemotherapy agents:  Keytruda (pembrolizumab)  To help prevent nausea and vomiting after your treatment, we encourage you to take your nausea medication as prescribed.   If you develop nausea and vomiting that is not controlled by your nausea medication, call the clinic.   BELOW ARE SYMPTOMS THAT SHOULD BE REPORTED IMMEDIATELY:  *FEVER GREATER THAN 100.5 F  *CHILLS WITH OR WITHOUT FEVER  NAUSEA AND VOMITING THAT IS NOT CONTROLLED WITH YOUR NAUSEA MEDICATION  *UNUSUAL SHORTNESS OF BREATH  *UNUSUAL BRUISING OR BLEEDING  TENDERNESS IN MOUTH AND THROAT WITH OR WITHOUT PRESENCE OF ULCERS  *URINARY PROBLEMS  *BOWEL PROBLEMS  UNUSUAL RASH Items with * indicate a potential emergency and should be followed up as soon as possible.  Feel free to call the clinic should you have any questions or concerns. The clinic phone number is [phone].  Please show the [facility] at check-in to the Emergency Department and triage nurse.

## 2018-01-22 ENCOUNTER — Encounter: Payer: Managed Care, Other (non HMO) | Admitting: Internal Medicine

## 2018-01-25 ENCOUNTER — Ambulatory Visit (INDEPENDENT_AMBULATORY_CARE_PROVIDER_SITE_OTHER): Payer: Managed Care, Other (non HMO) | Admitting: Internal Medicine

## 2018-01-25 ENCOUNTER — Encounter: Payer: Self-pay | Admitting: Internal Medicine

## 2018-01-25 VITALS — BP 130/82 | HR 85 | Temp 98.5°F | Ht 71.0 in | Wt 155.0 lb

## 2018-01-25 DIAGNOSIS — F17200 Nicotine dependence, unspecified, uncomplicated: Secondary | ICD-10-CM

## 2018-01-25 DIAGNOSIS — R490 Dysphonia: Secondary | ICD-10-CM | POA: Diagnosis not present

## 2018-01-25 DIAGNOSIS — Z0000 Encounter for general adult medical examination without abnormal findings: Secondary | ICD-10-CM | POA: Diagnosis not present

## 2018-01-25 DIAGNOSIS — F329 Major depressive disorder, single episode, unspecified: Secondary | ICD-10-CM | POA: Insufficient documentation

## 2018-01-25 DIAGNOSIS — D126 Benign neoplasm of colon, unspecified: Secondary | ICD-10-CM | POA: Diagnosis not present

## 2018-01-25 DIAGNOSIS — F32A Depression, unspecified: Secondary | ICD-10-CM | POA: Insufficient documentation

## 2018-01-25 DIAGNOSIS — B001 Herpesviral vesicular dermatitis: Secondary | ICD-10-CM

## 2018-01-25 DIAGNOSIS — F419 Anxiety disorder, unspecified: Secondary | ICD-10-CM | POA: Diagnosis not present

## 2018-01-25 MED ORDER — MIRTAZAPINE 30 MG PO TABS: 30.0000 mg | ORAL_TABLET | Freq: Every day | ORAL | 5 refills | Status: DC

## 2018-01-25 MED ORDER — ESOMEPRAZOLE MAGNESIUM 40 MG PO CPDR: DELAYED_RELEASE_CAPSULE | ORAL | 3 refills | Status: DC

## 2018-01-25 MED ORDER — ACYCLOVIR 400 MG PO TABS: 400.0000 mg | ORAL_TABLET | Freq: Three times a day (TID) | ORAL | 3 refills | Status: DC

## 2018-01-25 MED ORDER — FLUTICASONE FUROATE-VILANTEROL 100-25 MCG/INH IN AEPB: INHALATION_SPRAY | RESPIRATORY_TRACT | 11 refills | Status: DC

## 2018-01-25 MED ORDER — CLONAZEPAM 0.5 MG PO TABS: 0.5000 mg | ORAL_TABLET | Freq: Two times a day (BID) | ORAL | 2 refills | Status: DC | PRN

## 2018-01-25 NOTE — Assessment & Plan Note (Signed)
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

## 2018-01-25 NOTE — Assessment & Plan Note (Signed)
We discussed age appropriate health related issues, including available/recomended screening tests and vaccinations. We discussed a need for adhering to healthy diet and exercise. Labs were ordered to be later reviewed . All questions were answered. Rectal per GI

## 2018-01-25 NOTE — Assessment & Plan Note (Signed)
Acyclovir prn

## 2018-01-25 NOTE — Assessment & Plan Note (Signed)
Ref to Dr Redmond Baseman

## 2018-01-25 NOTE — Progress Notes (Signed)
Subjective:  Patient ID: Trevor Zavala, male    DOB: September 06, 1966  Age: 52 y.o. MRN: [id number]  CC: No chief complaint on file.   HPI Trevor Zavala presents for a well exam C/o cold sores C/o anxiety, insomnia, depression  Outpatient Medications Prior to Visit  Medication Sig Dispense Refill  . BREO ELLIPTA 100-25 MCG/INH AEPB INHALE 1 PUFF INTO THE LUNGS DAILY. ANNUAL APPT IS DUE MUST SEE PROVIDER FOR FUTURE REFILLS 60 each 11  . clonazePAM (KLONOPIN) 0.5 MG tablet Take 1 tablet (0.5 mg total) by mouth 2 (two) times daily as needed for anxiety. 180 tablet 0  . docusate sodium (COLACE) 100 MG capsule Take 100 mg daily by mouth.    . esomeprazole (NEXIUM) 40 MG capsule TAKE 1 CAPSULE BY MOUTH DAILY WITH BREAKFAST 90 capsule 1  . loratadine (CLARITIN) 10 MG tablet Take 10 mg by mouth daily as needed for allergies.    . nicotine (NICODERM CQ) 21 mg/24hr patch Place 1 patch (21 mg total) onto the skin daily. 28 patch 0   No facility-administered medications prior to visit.     ROS Review of Systems  Constitutional: Positive for fatigue and unexpected weight change. Negative for appetite change.  HENT: Positive for congestion and voice change. Negative for nosebleeds, sneezing, sore throat and trouble swallowing.   Eyes: Negative for itching and visual disturbance.  Respiratory: Positive for wheezing. Negative for cough and shortness of breath.   Cardiovascular: Negative for chest pain, palpitations and leg swelling.  Gastrointestinal: Negative for abdominal distention, blood in stool, diarrhea and nausea.  Genitourinary: Negative for frequency and hematuria.  Musculoskeletal: Positive for arthralgias. Negative for back pain, gait problem, joint swelling and neck pain.  Skin: Positive for rash.  Neurological: Negative for dizziness, tremors, speech difficulty and weakness.  Psychiatric/Behavioral: Positive for decreased concentration, dysphoric mood and sleep disturbance. Negative  for agitation and suicidal ideas. The patient is nervous/anxious.     Objective:  BP 130/82 (BP Location: Left Arm, Patient Position: Sitting, Cuff Size: Normal)   Pulse 85   Temp 98.5 F (36.9 C) (Oral)   Ht 5\' 11"  (1.803 m)   Wt 155 lb (70.3 kg)   SpO2 98%   BMI 21.62 kg/m   BP Readings from Last 3 Encounters:  01/25/18 130/82  01/21/18 137/74  12/31/17 128/71    Wt Readings from Last 3 Encounters:  01/25/18 155 lb (70.3 kg)  01/21/18 153 lb 11.2 oz (69.7 kg)  12/31/17 157 lb 1.6 oz (71.3 kg)    Physical Exam  Constitutional: He is oriented to person, place, and time. He appears well-developed. No distress.  NAD  HENT:  Mouth/Throat: Oropharynx is clear and moist.  Eyes: Conjunctivae are normal. Pupils are equal, round, and reactive to light.  Neck: Normal range of motion. No JVD present. No thyromegaly present.  Cardiovascular: Normal rate, regular rhythm, normal heart sounds and intact distal pulses. Exam reveals no gallop and no friction rub.  No murmur heard. Pulmonary/Chest: Effort normal and breath sounds normal. No respiratory distress. He has no wheezes. He has no rales. He exhibits no tenderness.  Abdominal: Soft. Bowel sounds are normal. He exhibits no distension and no mass. There is no tenderness. There is no rebound and no guarding.  Musculoskeletal: Normal range of motion. He exhibits no edema or tenderness.  Lymphadenopathy:    He has no cervical adenopathy.  Neurological: He is alert and oriented to person, place, and time. He has normal reflexes.  No cranial nerve deficit. He exhibits normal muscle tone. He displays a negative Romberg sign. Coordination and gait normal.  Skin: Skin is warm and dry. No rash noted.  Psychiatric: He has a normal mood and affect. His behavior is normal. Judgment and thought content normal.   Rectal per GI Hoarse  Lab Results  Component Value Date   WBC 9.9 01/21/2018   HGB 15.5 01/21/2018   HCT 46.2 01/21/2018   PLT  205 01/21/2018   GLUCOSE 126 01/21/2018   CHOL 174 11/23/2014   TRIG 93.0 11/23/2014   HDL 52.50 11/23/2014   LDLDIRECT 91.6 08/19/2010   LDLCALC 103 (H) 11/23/2014   ALT 20 01/21/2018   AST 11 01/21/2018   NA 141 01/21/2018   K 4.1 01/21/2018   CL 106 01/21/2018   CREATININE 0.73 01/21/2018   BUN 13 01/21/2018   CO2 25 01/21/2018   TSH 2.931 01/21/2018   PSA 1.24 11/23/2014   INR 1.14 08/13/2017   HGBA1C 6.4 (H) 08/17/2017    Ct Soft Tissue Neck W Contrast  Result Date: 01/19/2018 CLINICAL DATA:  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 Sore throat. EXAM: CT NECK WITH CONTRAST TECHNIQUE: Multidetector CT imaging of the neck was performed using the standard protocol following the bolus administration of intravenous contrast. CONTRAST:  40mL ISOVUE-300 IOPAMIDOL (ISOVUE-300) INJECTION 61% COMPARISON:  11/17/2017.  08/07/2017. FINDINGS: Pharynx and larynx: No sign of identifiable residual or recurrent mass lesion. Continued radiation changes in the region of the hypopharynx, supraglottic and glottic region with edema, though this is improving over time. Salivary glands: Parotid and submandibular glands are normal. Thyroid: Normal Lymph nodes: Previously seen left supraclavicular node is normal an unremarkable presently. No other new or enlarging nodes. Vascular: Atherosclerotic calcification of the carotid bifurcation regions. Right internal jugular catheter. No significant finding. Limited intracranial: Normal Visualized orbits: Normal Mastoids and visualized paranasal sinuses: Clear. Previous mastoidectomy on the left. Skeleton: Otherwise negative. Upper chest: See results of concurrent chest CT. Other: None significant IMPRESSION: No evidence of residual or recurrent disease. Improving post radiation edema of the hypopharynx, supraglottic and glottic region. No abnormal lymph nodes today. Previously seen left supraclavicular node  is now tiny and within normal limits. Electronically Signed   By: Nelson Chimes M.D.   On: 01/19/2018 07:47   Ct Chest W Contrast  Result Date: 01/19/2018 CLINICAL DATA:  Squamous cell carcinoma left lung. On going immunotherapy. Completed chemotherapy and radiation therapy October 2018. EXAM: CT CHEST WITH CONTRAST TECHNIQUE: Multidetector CT imaging of the chest was performed during intravenous contrast administration. CONTRAST:  35mL ISOVUE-300 IOPAMIDOL (ISOVUE-300) INJECTION 61% COMPARISON:  11/17/2017 FINDINGS: Cardiovascular: The heart is normal in size and stable. No pericardial effusion. Stable mild tortuosity of the thoracic aorta and a few scattered atherosclerotic calcifications. No aneurysm or dissection. No definite coronary artery calcifications. Mediastinum/Nodes: Stable matted soft tissue density in the left hilum and mediastinum likely treated tumor. No findings suspicious for recurrent tumor. The esophagus is grossly normal. Lungs/Pleura: Stable radiation changes involving the left hilum and paramediastinal lung. Stable soft tissue thickening and narrowing along the left upper lobe bronchus. The nodularity described in the left upper lobe on the prior study has resolved and was likely radiation change. No new worrisome pulmonary nodules to suggest metastatic disease. No pleural effusions. Upper Abdomen: No significant upper abdominal findings. No evidence of hepatic or adrenal gland metastasis. Musculoskeletal: No significant bony findings. IMPRESSION: 1. Stable matted  soft tissue density in the left hilum and mediastinum and surrounding the left upper lobe bronchus consistent with treated tumor. No new or progressive findings are identified. 2. Resolution of nodular densities seen on the prior CT scan in the left upper lobe. Stable radiation changes. 3. No new or worrisome pulmonary lesions/nodules. 4. Stable underlying emphysematous changes. Aortic Atherosclerosis (ICD10-I70.0) and Emphysema  (ICD10-J43.9). Electronically Signed   By: Marijo Sanes M.D.   On: 01/19/2018 10:11    Assessment & Plan:   Diagnoses and all orders for this visit:  Adenomatous polyp of colon, unspecified part of colon  Cold sore   I am having [name] maintain his clonazePAM, loratadine, nicotine, esomeprazole, docusate sodium, and BREO ELLIPTA.  No orders of the defined types were placed in this encounter.    Follow-up: No Follow-up on file.  Walker Kehr, MD

## 2018-01-25 NOTE — Assessment & Plan Note (Addendum)
1/2 ppd - discussed prevnar offered

## 2018-01-25 NOTE — Assessment & Plan Note (Signed)
Try Remeron

## 2018-02-11 ENCOUNTER — Inpatient Hospital Stay: Payer: Managed Care, Other (non HMO)

## 2018-02-11 ENCOUNTER — Telehealth: Payer: Self-pay | Admitting: Internal Medicine

## 2018-02-11 ENCOUNTER — Inpatient Hospital Stay: Payer: Managed Care, Other (non HMO) | Attending: Internal Medicine

## 2018-02-11 ENCOUNTER — Inpatient Hospital Stay (HOSPITAL_BASED_OUTPATIENT_CLINIC_OR_DEPARTMENT_OTHER): Payer: Managed Care, Other (non HMO) | Admitting: Internal Medicine

## 2018-02-11 ENCOUNTER — Encounter: Payer: Self-pay | Admitting: Internal Medicine

## 2018-02-11 VITALS — BP 130/77 | HR 83 | Temp 98.3°F | Resp 18 | Ht 71.0 in | Wt 155.4 lb

## 2018-02-11 DIAGNOSIS — C321 Malignant neoplasm of supraglottis: Secondary | ICD-10-CM | POA: Insufficient documentation

## 2018-02-11 DIAGNOSIS — R490 Dysphonia: Secondary | ICD-10-CM | POA: Diagnosis not present

## 2018-02-11 DIAGNOSIS — Z79899 Other long term (current) drug therapy: Secondary | ICD-10-CM | POA: Insufficient documentation

## 2018-02-11 DIAGNOSIS — Z9221 Personal history of antineoplastic chemotherapy: Secondary | ICD-10-CM | POA: Diagnosis not present

## 2018-02-11 DIAGNOSIS — R5382 Chronic fatigue, unspecified: Secondary | ICD-10-CM

## 2018-02-11 DIAGNOSIS — J449 Chronic obstructive pulmonary disease, unspecified: Secondary | ICD-10-CM | POA: Diagnosis not present

## 2018-02-11 DIAGNOSIS — I10 Essential (primary) hypertension: Secondary | ICD-10-CM | POA: Diagnosis not present

## 2018-02-11 DIAGNOSIS — Z5112 Encounter for antineoplastic immunotherapy: Secondary | ICD-10-CM | POA: Insufficient documentation

## 2018-02-11 DIAGNOSIS — Z923 Personal history of irradiation: Secondary | ICD-10-CM | POA: Diagnosis not present

## 2018-02-11 DIAGNOSIS — C3412 Malignant neoplasm of upper lobe, left bronchus or lung: Secondary | ICD-10-CM

## 2018-02-11 DIAGNOSIS — K219 Gastro-esophageal reflux disease without esophagitis: Secondary | ICD-10-CM | POA: Diagnosis not present

## 2018-02-11 DIAGNOSIS — F418 Other specified anxiety disorders: Secondary | ICD-10-CM | POA: Insufficient documentation

## 2018-02-11 LAB — COMPREHENSIVE METABOLIC PANEL
ALBUMIN: 3.4 g/dL — AB (ref 3.5–5.0)
ALK PHOS: 97 U/L (ref 40–150)
ALT: 13 U/L (ref 0–55)
ANION GAP: 7 (ref 3–11)
AST: 12 U/L (ref 5–34)
BILIRUBIN TOTAL: 0.5 mg/dL (ref 0.2–1.2)
BUN: 14 mg/dL (ref 7–26)
CALCIUM: 9.3 mg/dL (ref 8.4–10.4)
CO2: 28 mmol/L (ref 22–29)
Chloride: 105 mmol/L (ref 98–109)
Creatinine, Ser: 0.72 mg/dL (ref 0.70–1.30)
GLUCOSE: 133 mg/dL (ref 70–140)
POTASSIUM: 4.1 mmol/L (ref 3.5–5.1)
Sodium: 140 mmol/L (ref 136–145)
TOTAL PROTEIN: 6.4 g/dL (ref 6.4–8.3)

## 2018-02-11 LAB — CBC WITH DIFFERENTIAL/PLATELET
Basophils Absolute: 0 10*3/uL (ref 0.0–0.1)
Basophils Relative: 0 %
Eosinophils Absolute: 0.1 10*3/uL (ref 0.0–0.5)
Eosinophils Relative: 1 %
HEMATOCRIT: 43 % (ref 38.4–49.9)
HEMOGLOBIN: 14.5 g/dL (ref 13.0–17.1)
LYMPHS ABS: 0.4 10*3/uL — AB (ref 0.9–3.3)
LYMPHS PCT: 8 %
MCH: 31.6 pg (ref 27.2–33.4)
MCHC: 33.7 g/dL (ref 32.0–36.0)
MCV: 93.7 fL (ref 79.3–98.0)
MONO ABS: 0.5 10*3/uL (ref 0.1–0.9)
Monocytes Relative: 9 %
NEUTROS ABS: 4.4 10*3/uL (ref 1.5–6.5)
NEUTROS PCT: 82 %
Platelets: 214 10*3/uL (ref 140–400)
RBC: 4.59 MIL/uL (ref 4.20–5.82)
RDW: 14 % (ref 11.0–14.6)
WBC: 5.4 10*3/uL (ref 4.0–10.3)

## 2018-02-11 LAB — TSH: TSH: 1.447 u[IU]/mL (ref 0.320–4.118)

## 2018-02-11 MED ORDER — SODIUM CHLORIDE 0.9 % IV SOLN
Freq: Once | INTRAVENOUS | Status: AC
  Administered 2018-02-11: 13:00:00 via INTRAVENOUS

## 2018-02-11 MED ORDER — SODIUM CHLORIDE 0.9% FLUSH
10.0000 mL | INTRAVENOUS | Status: DC | PRN
  Administered 2018-02-11: 10 mL
  Filled 2018-02-11: qty 10

## 2018-02-11 MED ORDER — HEPARIN SOD (PORK) LOCK FLUSH 100 UNIT/ML IV SOLN
500.0000 [IU] | Freq: Once | INTRAVENOUS | Status: AC | PRN
  Administered 2018-02-11: 500 [IU]
  Filled 2018-02-11: qty 5

## 2018-02-11 MED ORDER — SODIUM CHLORIDE 0.9 % IV SOLN
200.0000 mg | Freq: Once | INTRAVENOUS | Status: AC
  Administered 2018-02-11: 200 mg via INTRAVENOUS
  Filled 2018-02-11: qty 8

## 2018-02-11 NOTE — Patient Instructions (Signed)
[facility] Discharge Instructions for Patients Receiving Chemotherapy  Today you received the following chemotherapy agents:  Keytruda (pembrolizumab)  To help prevent nausea and vomiting after your treatment, we encourage you to take your nausea medication as prescribed.   If you develop nausea and vomiting that is not controlled by your nausea medication, call the clinic.   BELOW ARE SYMPTOMS THAT SHOULD BE REPORTED IMMEDIATELY:  *FEVER GREATER THAN 100.5 F  *CHILLS WITH OR WITHOUT FEVER  NAUSEA AND VOMITING THAT IS NOT CONTROLLED WITH YOUR NAUSEA MEDICATION  *UNUSUAL SHORTNESS OF BREATH  *UNUSUAL BRUISING OR BLEEDING  TENDERNESS IN MOUTH AND THROAT WITH OR WITHOUT PRESENCE OF ULCERS  *URINARY PROBLEMS  *BOWEL PROBLEMS  UNUSUAL RASH Items with * indicate a potential emergency and should be followed up as soon as possible.  Feel free to call the clinic should you have any questions or concerns. The clinic phone number is [phone].  Please show the [facility] at check-in to the Emergency Department and triage nurse.

## 2018-02-11 NOTE — Telephone Encounter (Signed)
Appts already scheduled 3 cycles per 4/4 los. No additional appts to add.

## 2018-02-11 NOTE — Progress Notes (Signed)
[facility] [phone]   [phone]  OFFICE PROGRESS NOTE  Trevor Zavala, Trevor Lacks, MD Trevor Zavala [id number]  DIAGNOSIS:  1) Stage IIB/IV (T3, N3, M1a) non-small cell lung cancer, squamous cell carcinoma presented with large left hilar mass in addition to mediastinal and left supraclavicular lymphadenopathy as well as contralateral right upper lobe nodule diagnosed in August 2018. PDL 1 expression: 90%. 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 First dose 06/29/2017. Status post 6 cycles. Last dose was given 08/03/2017.  CURRENT THERAPY: Immunotherapy with Ketruda 200 MG IV every 3 weeks, first dose 09/17/2017.  Status post 7 cycles.  INTERVAL HISTORY: Trevor Zavala 52 y.o. male returns to the clinic today for routine follow-up visit.  The patient is feeling fine with no specific complaints.  He denied having any chest pain, shortness breath, cough or hemoptysis.  He denied having any fever or chills.  He has no nausea, vomiting, diarrhea or constipation.  He continues to tolerate his treatment with immunotherapy fairly well.  The patient is here today for evaluation before starting cycle #8 of his treatment.  MEDICAL HISTORY: Past Medical History:  Diagnosis Date  . Anxiety   . COPD (chronic obstructive pulmonary disease) ([place])   . GERD (gastroesophageal reflux disease)   . Headache   . Hypertension   . Pneumonia   . Situational depression   . Stage III squamous cell carcinoma of left lung ([place]) 06/19/2017    ALLERGIES:  has No Known Allergies.  MEDICATIONS:  Current Outpatient Medications  Medication Sig Dispense Refill  . acyclovir (ZOVIRAX) 400 MG tablet Take 1 tablet (400 mg total) by mouth 3 (three) times daily. 21 tablet 3  . clonazePAM (KLONOPIN) 0.5 MG tablet Take 1-2 tablets (0.5-1 mg  total) by mouth 2 (two) times daily as needed for anxiety. 180 tablet 2  . docusate sodium (COLACE) 100 MG capsule Take 100 mg daily by mouth.    . esomeprazole (NEXIUM) 40 MG capsule 1 po qam 90 capsule 3  . fluticasone furoate-vilanterol (BREO ELLIPTA) 100-25 MCG/INH AEPB INHALE 1 PUFF INTO THE LUNGS DAILY. 1 each 11  . loratadine (CLARITIN) 10 MG tablet Take 10 mg by mouth daily as needed for allergies.    . mirtazapine (REMERON) 30 MG tablet Take 1 tablet (30 mg total) by mouth daily. Take 1-2 hrs before dinner 30 tablet 5  . nicotine (NICODERM CQ) 21 mg/24hr patch Place 1 patch (21 mg total) onto the skin daily. 28 patch 0   No current facility-administered medications for this visit.     SURGICAL HISTORY:  Past Surgical History:  Procedure Laterality Date  . COLONOSCOPY    . DIRECT LARYNGOSCOPY N/A 06/25/2017   Procedure: DIRECT LARYNGOSCOPY AND BIOPSY;  Surgeon: Rozetta Nunnery, MD;  Location: [facility];  Service: ENT;  Laterality: N/A;  . EAR CYST EXCISION N/A 11/17/2013   Procedure: SEBACEOUS CYST CHEST;  Surgeon: Joyice Faster. Cornett, MD;  Location: [facility];  Service: General;  Laterality: N/A;  . IR GASTROSTOMY TUBE MOD SED  08/10/2017  . IR GASTROSTOMY TUBE REMOVAL  09/29/2017  . KNEE ARTHROSCOPY     LEFT  . LIPOMA EXCISION N/A 11/17/2013   Procedure: EXCISION LIPOMA FOREHEAD;  Surgeon: Joyice Faster. Cornett, MD;  Location: [facility];  Service: General;  Laterality: N/A;  . LUNG BIOPSY Bilateral 06/12/2017   Procedure: LEFT LUNG BIOPSY;  Surgeon: Grace Isaac, MD;  Location: [facility];  Service: Thoracic;  Laterality: Bilateral;  . PORTACATH PLACEMENT Right 07/01/2017   Procedure: INSERTION PORT-A-CATH - RIGHT IJ - placed with Fluoro and Ultrasound;  Surgeon: Grace Isaac, MD;  Location: [facility];  Service: Thoracic;  Laterality: Right;  Marland Kitchen VIDEO BRONCHOSCOPY WITH ENDOBRONCHIAL ULTRASOUND N/A 06/12/2017   Procedure: VIDEO  BRONCHOSCOPY WITH ENDOBRONCHIAL ULTRASOUND;  Surgeon: Grace Isaac, MD;  Location: [facility];  Service: Thoracic;  Laterality: N/A;    REVIEW OF SYSTEMS:  A comprehensive review of systems was negative except for: Ears, nose, mouth, throat, and face: positive for hoarseness   PHYSICAL EXAMINATION: General appearance: alert, cooperative and no distress Head: Normocephalic, without obvious abnormality, atraumatic Neck: no adenopathy, no JVD, supple, symmetrical, trachea midline and thyroid not enlarged, symmetric, no tenderness/mass/nodules Lymph nodes: Cervical, supraclavicular, and axillary nodes normal. Resp: clear to auscultation bilaterally Back: symmetric, no curvature. ROM normal. No CVA tenderness. Cardio: regular rate and rhythm, S1, S2 normal, no murmur, click, rub or gallop GI: soft, non-tender; bowel sounds normal; no masses,  no organomegaly Extremities: extremities normal, atraumatic, no cyanosis or edema  ECOG PERFORMANCE STATUS: 1 - Symptomatic but completely ambulatory  Blood pressure 130/77, pulse 83, temperature 98.3 F (36.8 C), temperature source Oral, resp. rate 18, height 5\' 11"  (1.803 m), weight 155 lb 6.4 oz (70.5 kg), SpO2 97 %.  LABORATORY DATA: Lab Results  Component Value Date   WBC 5.4 02/11/2018   HGB 14.5 02/11/2018   HCT 43.0 02/11/2018   MCV 93.7 02/11/2018   PLT 214 02/11/2018      Chemistry      Component Value Date/Time   NA 141 01/21/2018 0818   NA 141 10/29/2017 1049   K 4.1 01/21/2018 0818   K 3.3 (L) 10/29/2017 1049   CL 106 01/21/2018 0818   CO2 25 01/21/2018 0818   CO2 26 10/29/2017 1049   BUN 13 01/21/2018 0818   BUN 11.2 10/29/2017 1049   CREATININE 0.73 01/21/2018 0818   CREATININE 0.7 10/29/2017 1049   GLU 198 08/25/2017      Component Value Date/Time   CALCIUM 9.5 01/21/2018 0818   CALCIUM 9.0 10/29/2017 1049   ALKPHOS 105 01/21/2018 0818   ALKPHOS 82 10/29/2017 1049   AST 11 01/21/2018 0818   AST 10 10/29/2017 1049     ALT 20 01/21/2018 0818   ALT 8 10/29/2017 1049   BILITOT 0.7 01/21/2018 0818   BILITOT 0.22 10/29/2017 1049       RADIOGRAPHIC STUDIES: Ct Soft Tissue Neck W Contrast  Result Date: 01/19/2018 CLINICAL DATA:  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 Sore throat. EXAM: CT NECK WITH CONTRAST TECHNIQUE: Multidetector CT imaging of the neck was performed using the standard protocol following the bolus administration of intravenous contrast. CONTRAST:  36mL ISOVUE-300 IOPAMIDOL (ISOVUE-300) INJECTION 61% COMPARISON:  11/17/2017.  08/07/2017. FINDINGS: Pharynx and larynx: No sign of identifiable residual or recurrent mass lesion. Continued radiation changes in the region of the hypopharynx, supraglottic and glottic region with edema, though this is improving over time. Salivary glands: Parotid and submandibular glands are normal. Thyroid: Normal Lymph nodes: Previously seen left supraclavicular node is normal an unremarkable presently. No other new or enlarging nodes. Vascular: Atherosclerotic calcification of the carotid bifurcation regions. Right internal jugular catheter. No significant finding. Limited  intracranial: Normal Visualized orbits: Normal Mastoids and visualized paranasal sinuses: Clear. Previous mastoidectomy on the left. Skeleton: Otherwise negative. Upper chest: See results of concurrent chest CT. Other: None significant IMPRESSION: No evidence of residual or recurrent disease. Improving post radiation edema of the hypopharynx, supraglottic and glottic region. No abnormal lymph nodes today. Previously seen left supraclavicular node is now tiny and within normal limits. Electronically Signed   By: Nelson Chimes M.D.   On: 01/19/2018 07:47   Ct Chest W Contrast  Result Date: 01/19/2018 CLINICAL DATA:  Squamous cell carcinoma left lung. On going immunotherapy. Completed chemotherapy and radiation therapy October 2018. EXAM: CT  CHEST WITH CONTRAST TECHNIQUE: Multidetector CT imaging of the chest was performed during intravenous contrast administration. CONTRAST:  35mL ISOVUE-300 IOPAMIDOL (ISOVUE-300) INJECTION 61% COMPARISON:  11/17/2017 FINDINGS: Cardiovascular: The heart is normal in size and stable. No pericardial effusion. Stable mild tortuosity of the thoracic aorta and a few scattered atherosclerotic calcifications. No aneurysm or dissection. No definite coronary artery calcifications. Mediastinum/Nodes: Stable matted soft tissue density in the left hilum and mediastinum likely treated tumor. No findings suspicious for recurrent tumor. The esophagus is grossly normal. Lungs/Pleura: Stable radiation changes involving the left hilum and paramediastinal lung. Stable soft tissue thickening and narrowing along the left upper lobe bronchus. The nodularity described in the left upper lobe on the prior study has resolved and was likely radiation change. No new worrisome pulmonary nodules to suggest metastatic disease. No pleural effusions. Upper Abdomen: No significant upper abdominal findings. No evidence of hepatic or adrenal gland metastasis. Musculoskeletal: No significant bony findings. IMPRESSION: 1. Stable matted soft tissue density in the left hilum and mediastinum and surrounding the left upper lobe bronchus consistent with treated tumor. No new or progressive findings are identified. 2. Resolution of nodular densities seen on the prior CT scan in the left upper lobe. Stable radiation changes. 3. No new or worrisome pulmonary lesions/nodules. 4. Stable underlying emphysematous changes. Aortic Atherosclerosis (ICD10-I70.0) and Emphysema (ICD10-J43.9). Electronically Signed   By: Marijo Sanes M.D.   On: 01/19/2018 10:11    ASSESSMENT AND PLAN: This is a very pleasant 52 years old white male with recently diagnosed stage IIIB/IV non-small cell lung cancer, adenocarcinoma presented with large left hilar mass in addition to  mediastinal and left supraclavicular lymphadenopathy as well as suspicious right upper lobe pulmonary nodule diagnosed in August 2018. 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 He underwent a course of concurrent chemoradiation to the lung as well as the epiglottic area under the care of Dr. Tammi Klippel. He is status post 6 cycle. He tolerated this course of treatment well except for the radiation induced esophagitis as well as weight loss and fatigue. The patient had partial response to the previous treatment. He is currently on treatment with Keytruda 200 mg IV every 3 weeks status post 7 cycles. The patient continues to tolerate this treatment well. I recommended for him to proceed with cycle #8 today as a scheduled He will come back for follow-up visit in 3 weeks for evaluation before starting cycle #9. For the hoarseness of his voice, he is followed by Dr. Redmond Baseman. The patient was advised to call immediately if he has any concerning symptoms in the interval. The patient voices understanding of current disease status and treatment options and is in agreement with the current care plan. All questions were answered. The patient knows to call the clinic with any problems, questions or concerns. We can  certainly see the patient much sooner if necessary. Disclaimer: This note was dictated with voice recognition software. Similar sounding words can inadvertently be transcribed and may not be corrected upon review.

## 2018-02-12 ENCOUNTER — Telehealth: Payer: Self-pay | Admitting: Internal Medicine

## 2018-02-12 NOTE — Telephone Encounter (Signed)
MM PAL - moved 4/25 lab/fu to 4/24 - treatment remains 4/25. Left message for patient and mailed updated schedule.

## 2018-02-18 ENCOUNTER — Encounter: Payer: Self-pay | Admitting: *Deleted

## 2018-02-18 NOTE — Progress Notes (Signed)
Oncology Nurse Navigator Documentation  Oncology Nurse Navigator Flowsheets 02/18/2018  Navigator Location [facility]  Navigator Encounter Type Telephone;Letter/Fax/Email/patinet's wife contacted me. She needed help changing appts to make them all on one day. I spoke with infusion coordinator and she was able to change the chemo appt. I called and spoke with patient's wife and updated her on the change in schedule.  I will follow up with an email too.   Telephone Outgoing Call  Treatment Phase Treatment  Barriers/Navigation Needs Coordination of Care;Education  Education Other  Interventions Coordination of Care;Education  Coordination of Care Appts  Education Method Verbal;Written  Acuity Level 2  Time Spent with Patient 30

## 2018-02-24 ENCOUNTER — Encounter (HOSPITAL_COMMUNITY): Payer: Self-pay

## 2018-02-24 ENCOUNTER — Emergency Department (HOSPITAL_COMMUNITY)
Admission: EM | Admit: 2018-02-24 | Discharge: 2018-02-24 | Disposition: A | Discharge status: Home / Self Care | Payer: Managed Care, Other (non HMO) | Attending: Emergency Medicine | Admitting: Emergency Medicine

## 2018-02-24 ENCOUNTER — Emergency Department (HOSPITAL_COMMUNITY): Payer: Managed Care, Other (non HMO)

## 2018-02-24 DIAGNOSIS — S6992XA Unspecified injury of left wrist, hand and finger(s), initial encounter: Secondary | ICD-10-CM | POA: Diagnosis present

## 2018-02-24 DIAGNOSIS — Y9289 Other specified places as the place of occurrence of the external cause: Secondary | ICD-10-CM | POA: Insufficient documentation

## 2018-02-24 DIAGNOSIS — Z8521 Personal history of malignant neoplasm of larynx: Secondary | ICD-10-CM | POA: Diagnosis not present

## 2018-02-24 DIAGNOSIS — I10 Essential (primary) hypertension: Secondary | ICD-10-CM | POA: Insufficient documentation

## 2018-02-24 DIAGNOSIS — D170 Benign lipomatous neoplasm of skin and subcutaneous tissue of head, face and neck: Secondary | ICD-10-CM | POA: Insufficient documentation

## 2018-02-24 DIAGNOSIS — Z85118 Personal history of other malignant neoplasm of bronchus and lung: Secondary | ICD-10-CM | POA: Diagnosis not present

## 2018-02-24 DIAGNOSIS — J449 Chronic obstructive pulmonary disease, unspecified: Secondary | ICD-10-CM | POA: Insufficient documentation

## 2018-02-24 DIAGNOSIS — Y9389 Activity, other specified: Secondary | ICD-10-CM | POA: Insufficient documentation

## 2018-02-24 DIAGNOSIS — W230XXA Caught, crushed, jammed, or pinched between moving objects, initial encounter: Secondary | ICD-10-CM | POA: Diagnosis not present

## 2018-02-24 DIAGNOSIS — Y999 Unspecified external cause status: Secondary | ICD-10-CM | POA: Insufficient documentation

## 2018-02-24 DIAGNOSIS — F17200 Nicotine dependence, unspecified, uncomplicated: Secondary | ICD-10-CM | POA: Insufficient documentation

## 2018-02-24 DIAGNOSIS — Z79899 Other long term (current) drug therapy: Secondary | ICD-10-CM | POA: Diagnosis not present

## 2018-02-24 MED ORDER — IBUPROFEN 200 MG PO TABS
600.0000 mg | ORAL_TABLET | Freq: Once | ORAL | Status: AC
  Administered 2018-02-24: 600 mg via ORAL
  Filled 2018-02-24: qty 3

## 2018-02-24 NOTE — ED Provider Notes (Signed)
[facility] DEPT Provider Note   CSN: [id number] Arrival date & time: 02/24/18  0535     History   Chief Complaint Chief Complaint  Patient presents with  . Hand Pain    HPI Trevor Zavala is a 52 y.o. male.  HPI Patient is a 52 year old right-handed male who presents the emergency department with injury to his left thumb sustained while driving a riding lawnmower yesterday.  He was holding the steering wheel with his left hand at the center of the wheel when his lawnmower hit a tree in the steering well suddenly turned multiple times.  This injured his left thumb.  He states he now has pain with flexion and extension of his left thumb.  No significant swelling.  He denies any other symptoms at this time.  Pain is mild to moderate in severity and worse with range of motion of his left MCP joint   Past Medical History:  Diagnosis Date  . Anxiety   . COPD (chronic obstructive pulmonary disease) ([place])   . GERD (gastroesophageal reflux disease)   . Headache   . Hypertension   . Pneumonia   . Situational depression   . Stage III squamous cell carcinoma of left lung ([place]) 06/19/2017    Patient Active Problem List   Diagnosis Date Noted  . Anxiety and depression 01/25/2018  . Hoarseness of voice 01/25/2018  . Encounter for antineoplastic immunotherapy 10/08/2017  . Cancer, epiglottis ([place])   . Protein-calorie malnutrition, severe 08/09/2017  . Protein-calorie malnutrition, moderate ([place]) 08/07/2017  . Hypokalemia 08/07/2017  . SOB (shortness of breath) 08/07/2017  . Dehydration 08/07/2017  . Port catheter in place 08/03/2017  . 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 Stage III squamous cell carcinoma of left lung ([place]) 06/19/2017  . Encounter for antineoplastic chemotherapy 06/19/2017  . Goals of care, counseling/discussion 06/19/2017  . Lung mass 06/08/2017  . Hemoptysis 06/01/2017  . Weight loss 06/01/2017  . Colon polyps  01/16/2017  . Essential hypertension 01/16/2017  . Generalized anxiety disorder 06/22/2015  . Situational depression 02/03/2014  . Post-operative state 12/02/2013  . Infected epidermoid cyst 10/28/2013  . Lipoma of face 10/18/2013  . Well adult exam 10/07/2013  . Hand eczema 10/07/2013  . Wheezing on auscultation 06/01/2012  . Chest pain 06/01/2012  . Back pain 06/01/2012  . Sebaceous cyst 04/05/2012  . Contracture of palmar fascia (Dupuytren's) 04/05/2012  . Lipoma of forehead 04/05/2012  . Cold sore 03/19/2012  . Rhinitis, chronic 08/22/2011  . OTITIS MEDIA, MUCOID, CHRONIC 12/31/2010  . Dysphagia 08/23/2010  . TOBACCO USER 07/10/2010  . COPD mixed type ([place]) 07/10/2010  . GERD 07/10/2010  . Cough 07/10/2010    Past Surgical History:  Procedure Laterality Date  . COLONOSCOPY    . DIRECT LARYNGOSCOPY N/A 06/25/2017   Procedure: DIRECT LARYNGOSCOPY AND BIOPSY;  Surgeon: Rozetta Nunnery, MD;  Location: [facility];  Service: ENT;  Laterality: N/A;  . EAR CYST EXCISION N/A 11/17/2013   Procedure: SEBACEOUS CYST CHEST;  Surgeon: Joyice Faster. Cornett, MD;  Location: [facility];  Service: General;  Laterality: N/A;  . IR GASTROSTOMY TUBE MOD SED  08/10/2017  . IR GASTROSTOMY TUBE REMOVAL  09/29/2017  . KNEE ARTHROSCOPY     LEFT  . LIPOMA EXCISION N/A 11/17/2013   Procedure: EXCISION LIPOMA FOREHEAD;  Surgeon: Joyice Faster. Cornett, MD;  Location: [facility];  Service: General;  Laterality: N/A;  . LUNG BIOPSY  Bilateral 06/12/2017   Procedure: LEFT LUNG BIOPSY;  Surgeon: Grace Isaac, MD;  Location: [facility];  Service: Thoracic;  Laterality: Bilateral;  . PORTACATH PLACEMENT Right 07/01/2017   Procedure: INSERTION PORT-A-CATH - RIGHT IJ - placed with Fluoro and Ultrasound;  Surgeon: Grace Isaac, MD;  Location: [facility];  Service: Thoracic;  Laterality: Right;  Marland Kitchen VIDEO BRONCHOSCOPY WITH ENDOBRONCHIAL ULTRASOUND N/A 06/12/2017   Procedure:  VIDEO BRONCHOSCOPY WITH ENDOBRONCHIAL ULTRASOUND;  Surgeon: Grace Isaac, MD;  Location: [facility];  Service: Thoracic;  Laterality: N/A;        Home Medications    Prior to Admission medications   Medication Sig Start Date End Date Taking? Authorizing Provider  acyclovir (ZOVIRAX) 400 MG tablet Take 1 tablet (400 mg total) by mouth 3 (three) times daily. 01/25/18   [name], [name], MD  clonazePAM (KLONOPIN) 0.5 MG tablet Take 1-2 tablets (0.5-1 mg total) by mouth 2 (two) times daily as needed for anxiety. 01/25/18   [name], [name], MD  docusate sodium (COLACE) 100 MG capsule Take 100 mg daily by mouth.    [provider]  esomeprazole (NEXIUM) 40 MG capsule 1 po qam 01/25/18   [name], [name], MD  fluticasone furoate-vilanterol (BREO ELLIPTA) 100-25 MCG/INH AEPB INHALE 1 PUFF INTO THE LUNGS DAILY. 01/25/18   [name], [name], MD  loratadine (CLARITIN) 10 MG tablet Take 10 mg by mouth daily as needed for allergies.    [provider]  mirtazapine (REMERON) 30 MG tablet Take 1 tablet (30 mg total) by mouth daily. Take 1-2 hrs before dinner 01/25/18   [name], [name], MD  nicotine (NICODERM CQ) 21 mg/24hr patch Place 1 patch (21 mg total) onto the skin daily. 06/06/17   Deno Etienne, DO    Family History Family History  Problem Relation Age of Onset  . Breast cancer Mother   . Hypertension Mother   . Colon cancer Mother   . Skin cancer Father   . Hypertension Father   . Colon polyps Father   . Cancer Father 51       b cell lymphoma  . Colon cancer Father     Social History Social History   Tobacco Use  . Smoking status: Current Every Day Smoker    Packs/day: 0.25    Years: 35.00    Pack years: 8.75    Last attempt to quit: 08/10/2017    Years since quitting: 0.5  . Smokeless tobacco: Never Used  . Tobacco comment: down to 4 cig/day01/08/2018   Substance Use Topics  . Alcohol use: Yes    Alcohol/week: 0.0 oz    Comment: SOCIAL  . Drug  use: No     Allergies   Patient has no known allergies.   Review of Systems Review of Systems  All other systems reviewed and are negative.    Physical Exam Updated Vital Signs BP 123/80 (BP Location: Left Arm)   Pulse 88   Temp 98.5 F (36.9 C) (Oral)   Resp 18   SpO2 96%   Physical Exam  Constitutional: He is oriented to person, place, and time. He appears well-developed and well-nourished.  HENT:  Head: Normocephalic.  Eyes: EOM are normal.  Neck: Normal range of motion.  Pulmonary/Chest: Effort normal.  Abdominal: He exhibits no distension.  Musculoskeletal: Normal range of motion.  Mild pain with range of motion of the left thumb.  No obvious swelling of the left thumb MCP joint.  Normal flexion and  extension at the left thumb IP joint.  Able to flex and extend at the left MCP joint.  Some tenderness along the distal aspect of the first metacarpal  Neurological: He is alert and oriented to person, place, and time.  Psychiatric: He has a normal mood and affect.  Nursing note and vitals reviewed.    ED Treatments / Results  Labs (all labs ordered are listed, but only abnormal results are displayed) Labs Reviewed - No data to display  EKG None  Radiology Dg Hand Complete Left  Result Date: 02/24/2018 CLINICAL DATA:  Injury to the left hand from steering wheel of lawn mower. Left hand pain. Initial encounter. EXAM: LEFT HAND - COMPLETE 3+ VIEW COMPARISON:  None. FINDINGS: There is no evidence of fracture or dislocation. The joint spaces are preserved. The carpal rows are intact, and demonstrate normal alignment. The soft tissues are unremarkable in appearance. IMPRESSION: No evidence of fracture or dislocation. Electronically Signed   By: Garald Balding M.D.   On: 02/24/2018 06:55    Procedures Procedures (including critical care time)   +++++++++++++++++++++++++++++++++++  SPLINT APPLICATION Authorized by: Jola Schmidt Consent: Verbal consent  obtained. Risks and benefits: risks, benefits and alternatives were discussed Consent given by: patient Splint applied by: orthopedic technician Location details: left thumb Splint type: thumba spica Supplies used: thumba spica Post-procedure: The splinted body part was neurovascularly unchanged following the procedure. Patient tolerance: Patient tolerated the procedure well with no immediate complications.  +++++++++++++++++++++++++++++++++++++++++   Medications Ordered in ED Medications  ibuprofen (ADVIL,MOTRIN) tablet 600 mg (has no administration in time range)     Initial Impression / Assessment and Plan / ED Course  I have reviewed the triage vital signs and the nursing notes.  Pertinent labs & imaging results that were available during my care of the patient were reviewed by me and considered in my medical decision making (see chart for details).     Likely ligamentous injury of the left thumb.  Placed in a thumb spica splint.  Orthopedic hand follow-up.  Patient understands return to the ER for new or worsening symptoms.  Final Clinical Impressions(s) / ED Diagnoses   Final diagnoses:  Thumb injury, left, initial encounter    ED Discharge Orders    None       Jola Schmidt, MD 02/24/18 [phone]

## 2018-02-24 NOTE — ED Triage Notes (Signed)
Pt was mowing yesterday and hit a tree stump and injured his left hand, he says its sore and swollen around the pad of his hand

## 2018-02-24 NOTE — Discharge Instructions (Addendum)
Use ibuprofen and tylenol for pain  Please follow up with the orthopedic specialist

## 2018-03-01 ENCOUNTER — Encounter: Payer: Self-pay | Admitting: Internal Medicine

## 2018-03-03 ENCOUNTER — Inpatient Hospital Stay: Payer: Managed Care, Other (non HMO)

## 2018-03-03 ENCOUNTER — Inpatient Hospital Stay (HOSPITAL_BASED_OUTPATIENT_CLINIC_OR_DEPARTMENT_OTHER): Payer: Managed Care, Other (non HMO) | Admitting: Internal Medicine

## 2018-03-03 ENCOUNTER — Telehealth: Payer: Self-pay | Admitting: Internal Medicine

## 2018-03-03 ENCOUNTER — Encounter: Payer: Self-pay | Admitting: Internal Medicine

## 2018-03-03 VITALS — BP 109/67 | HR 82 | Temp 97.9°F | Resp 18 | Ht 71.0 in | Wt 159.1 lb

## 2018-03-03 DIAGNOSIS — C3412 Malignant neoplasm of upper lobe, left bronchus or lung: Secondary | ICD-10-CM

## 2018-03-03 DIAGNOSIS — Z9221 Personal history of antineoplastic chemotherapy: Secondary | ICD-10-CM

## 2018-03-03 DIAGNOSIS — C321 Malignant neoplasm of supraglottis: Secondary | ICD-10-CM

## 2018-03-03 DIAGNOSIS — K219 Gastro-esophageal reflux disease without esophagitis: Secondary | ICD-10-CM

## 2018-03-03 DIAGNOSIS — J449 Chronic obstructive pulmonary disease, unspecified: Secondary | ICD-10-CM | POA: Diagnosis not present

## 2018-03-03 DIAGNOSIS — Z79899 Other long term (current) drug therapy: Secondary | ICD-10-CM | POA: Diagnosis not present

## 2018-03-03 DIAGNOSIS — R490 Dysphonia: Secondary | ICD-10-CM

## 2018-03-03 DIAGNOSIS — I10 Essential (primary) hypertension: Secondary | ICD-10-CM

## 2018-03-03 DIAGNOSIS — F418 Other specified anxiety disorders: Secondary | ICD-10-CM | POA: Diagnosis not present

## 2018-03-03 DIAGNOSIS — Z5112 Encounter for antineoplastic immunotherapy: Secondary | ICD-10-CM

## 2018-03-03 DIAGNOSIS — Z923 Personal history of irradiation: Secondary | ICD-10-CM | POA: Diagnosis not present

## 2018-03-03 DIAGNOSIS — R5382 Chronic fatigue, unspecified: Secondary | ICD-10-CM

## 2018-03-03 DIAGNOSIS — Z95828 Presence of other vascular implants and grafts: Secondary | ICD-10-CM

## 2018-03-03 DIAGNOSIS — C3490 Malignant neoplasm of unspecified part of unspecified bronchus or lung: Secondary | ICD-10-CM

## 2018-03-03 LAB — COMPREHENSIVE METABOLIC PANEL
ALK PHOS: 91 U/L (ref 40–150)
ALT: 14 U/L (ref 0–55)
AST: 11 U/L (ref 5–34)
Albumin: 3.6 g/dL (ref 3.5–5.0)
Anion gap: 11 (ref 3–11)
BUN: 11 mg/dL (ref 7–26)
CALCIUM: 9.3 mg/dL (ref 8.4–10.4)
CO2: 24 mmol/L (ref 22–29)
Chloride: 108 mmol/L (ref 98–109)
Creatinine, Ser: 0.71 mg/dL (ref 0.70–1.30)
GFR calc non Af Amer: >60 mL/min (ref >60)
Glucose, Bld: 126 mg/dL (ref 70–140)
Potassium: 3.8 mmol/L (ref 3.5–5.1)
SODIUM: 143 mmol/L (ref 136–145)
Total Bilirubin: 0.3 mg/dL (ref 0.2–1.2)
Total Protein: 6.1 g/dL — ABNORMAL LOW (ref 6.4–8.3)

## 2018-03-03 LAB — CBC WITH DIFFERENTIAL/PLATELET
BASOS PCT: 1 %
Basophils Absolute: 0 10*3/uL (ref 0.0–0.1)
EOS ABS: 0.1 10*3/uL (ref 0.0–0.5)
EOS PCT: 1 %
HCT: 41.3 % (ref 38.4–49.9)
HEMOGLOBIN: 14.3 g/dL (ref 13.0–17.1)
Lymphocytes Relative: 9 %
Lymphs Abs: 0.6 10*3/uL — ABNORMAL LOW (ref 0.9–3.3)
MCH: 32.4 pg (ref 27.2–33.4)
MCHC: 34.6 g/dL (ref 32.0–36.0)
MCV: 93.4 fL (ref 79.3–98.0)
MONO ABS: 0.6 10*3/uL (ref 0.1–0.9)
MONOS PCT: 9 %
NEUTROS PCT: 80 %
Neutro Abs: 5.5 10*3/uL (ref 1.5–6.5)
Platelets: 244 10*3/uL (ref 140–400)
RBC: 4.42 MIL/uL (ref 4.20–5.82)
RDW: 14.7 % — AB (ref 11.0–14.6)
WBC: 6.8 10*3/uL (ref 4.0–10.3)

## 2018-03-03 LAB — TSH: TSH: 1.367 u[IU]/mL (ref 0.320–4.118)

## 2018-03-03 MED ORDER — SODIUM CHLORIDE 0.9% FLUSH
10.0000 mL | Freq: Once | INTRAVENOUS | Status: AC
  Administered 2018-03-03: 10 mL
  Filled 2018-03-03: qty 10

## 2018-03-03 MED ORDER — HEPARIN SOD (PORK) LOCK FLUSH 100 UNIT/ML IV SOLN
500.0000 [IU] | Freq: Once | INTRAVENOUS | Status: AC | PRN
  Administered 2018-03-03: 500 [IU]
  Filled 2018-03-03: qty 5

## 2018-03-03 MED ORDER — SODIUM CHLORIDE 0.9% FLUSH
10.0000 mL | INTRAVENOUS | Status: DC | PRN
  Administered 2018-03-03: 10 mL
  Filled 2018-03-03: qty 10

## 2018-03-03 MED ORDER — OXYCODONE-ACETAMINOPHEN 5-325 MG PO TABS: 1.0000 | ORAL_TABLET | Freq: Three times a day (TID) | ORAL | 0 refills | Status: DC | PRN

## 2018-03-03 MED ORDER — SODIUM CHLORIDE 0.9 % IV SOLN
200.0000 mg | Freq: Once | INTRAVENOUS | Status: AC
  Administered 2018-03-03: 200 mg via INTRAVENOUS
  Filled 2018-03-03: qty 8

## 2018-03-03 MED ORDER — SODIUM CHLORIDE 0.9 % IV SOLN
Freq: Once | INTRAVENOUS | Status: AC
  Administered 2018-03-03: 14:00:00 via INTRAVENOUS

## 2018-03-03 MED FILL — OXYCODONE-ACETAMINOPHEN 5-3: 5-325 | 10 days supply | Qty: 30 | Fill #0

## 2018-03-03 NOTE — Telephone Encounter (Signed)
Scheduled appt per 4/24 los - pt to get an updated schedule in the treatment area - central radiology to contact patient with ct scan .

## 2018-03-03 NOTE — Patient Instructions (Signed)
Combine Discharge Instructions for Patients Receiving Chemotherapy  Today you received the following chemotherapy agents: Pembrolizumab Beryle Flock).  To help prevent nausea and vomiting after your treatment, we encourage you to take your nausea medication as prescribed.  If you develop nausea and vomiting that is not controlled by your nausea medication, call the clinic.   BELOW ARE SYMPTOMS THAT SHOULD BE REPORTED IMMEDIATELY:  *FEVER GREATER THAN 100.5 F  *CHILLS WITH OR WITHOUT FEVER  NAUSEA AND VOMITING THAT IS NOT CONTROLLED WITH YOUR NAUSEA MEDICATION  *UNUSUAL SHORTNESS OF BREATH  *UNUSUAL BRUISING OR BLEEDING  TENDERNESS IN MOUTH AND THROAT WITH OR WITHOUT PRESENCE OF ULCERS  *URINARY PROBLEMS  *BOWEL PROBLEMS  UNUSUAL RASH Items with * indicate a potential emergency and should be followed up as soon as possible.  Feel free to call the clinic should you have any questions or concerns. The clinic phone number is [phone].  Please show the [facility] at check-in to the Emergency Department and triage nurse.

## 2018-03-03 NOTE — Progress Notes (Signed)
[facility] [phone]   [phone]  OFFICE PROGRESS NOTE  [name], [name], MD [place] [address]  DIAGNOSIS:  1) Stage IIB/IV (T3, N3, M1a) non-small cell lung cancer, squamous cell carcinoma presented with large left hilar mass in addition to mediastinal and left supraclavicular lymphadenopathy as well as contralateral right upper lobe nodule diagnosed in August 2018. PDL 1 expression: 90%. 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 First dose 06/29/2017. Status post 6 cycles. Last dose was given 08/03/2017.  CURRENT THERAPY: Immunotherapy with Ketruda 200 MG IV every 3 weeks, first dose 09/17/2017.  Status post 8 cycles.  INTERVAL HISTORY: Trevor Zavala 52 y.o. male returns to the clinic today for follow-up visit.  The patient is feeling fine except for the bilateral shoulder pain.  He received steroid injection to the left shoulder with improvement in his condition but no significant improvement on the right side.  He was advised by his orthopedic surgeon to receive pain medication from [place] because he is currently on immune therapy.  He is current on treatment with ibuprofen with no improvement of his pain management.  He denied having any chest pain, shortness of breath, cough or hemoptysis.  He denied having any fever or chills.  He has no nausea, vomiting, diarrhea or constipation.  The patient is here today for evaluation before starting cycle #9 of his treatment.  MEDICAL HISTORY: Past Medical History:  Diagnosis Date  . Anxiety   . COPD (chronic obstructive pulmonary disease) ([place])   . GERD (gastroesophageal reflux disease)   . Headache   . Hypertension   . Pneumonia   . Situational depression   . Stage III squamous cell carcinoma of left lung ([facility]) 06/19/2017    ALLERGIES:  has No  Known Allergies.  MEDICATIONS:  Current Outpatient Medications  Medication Sig Dispense Refill  . acyclovir (ZOVIRAX) 400 MG tablet Take 1 tablet (400 mg total) by mouth 3 (three) times daily. 21 tablet 3  . clonazePAM (KLONOPIN) 0.5 MG tablet Take 1-2 tablets (0.5-1 mg total) by mouth 2 (two) times daily as needed for anxiety. 180 tablet 2  . docusate sodium (COLACE) 100 MG capsule Take 100 mg daily by mouth.    . esomeprazole (NEXIUM) 40 MG capsule 1 po qam 90 capsule 3  . fluticasone furoate-vilanterol (BREO ELLIPTA) 100-25 MCG/INH AEPB INHALE 1 PUFF INTO THE LUNGS DAILY. 1 each 11  . loratadine (CLARITIN) 10 MG tablet Take 10 mg by mouth daily as needed for allergies.    . mirtazapine (REMERON) 30 MG tablet Take 1 tablet (30 mg total) by mouth daily. Take 1-2 hrs before dinner 30 tablet 5  . nicotine (NICODERM CQ) 21 mg/24hr patch Place 1 patch (21 mg total) onto the skin daily. 28 patch 0   No current facility-administered medications for this visit.     SURGICAL HISTORY:  Past Surgical History:  Procedure Laterality Date  . COLONOSCOPY    . DIRECT LARYNGOSCOPY N/A 06/25/2017   Procedure: DIRECT LARYNGOSCOPY AND BIOPSY;  Surgeon: Rozetta Nunnery, MD;  Location: [facility];  Service: ENT;  Laterality: N/A;  . EAR CYST EXCISION N/A 11/17/2013   Procedure: SEBACEOUS CYST CHEST;  Surgeon: Joyice Faster. Cornett, MD;  Location: [facility];  Service: General;  Laterality: N/A;  . IR GASTROSTOMY  TUBE MOD SED  08/10/2017  . IR GASTROSTOMY TUBE REMOVAL  09/29/2017  . KNEE ARTHROSCOPY     LEFT  . LIPOMA EXCISION N/A 11/17/2013   Procedure: EXCISION LIPOMA FOREHEAD;  Surgeon: Joyice Faster. Cornett, MD;  Location: [facility];  Service: General;  Laterality: N/A;  . LUNG BIOPSY Bilateral 06/12/2017   Procedure: LEFT LUNG BIOPSY;  Surgeon: Grace Isaac, MD;  Location: [facility];  Service: Thoracic;  Laterality: Bilateral;  . PORTACATH PLACEMENT Right  07/01/2017   Procedure: INSERTION PORT-A-CATH - RIGHT IJ - placed with Fluoro and Ultrasound;  Surgeon: Grace Isaac, MD;  Location: [facility];  Service: Thoracic;  Laterality: Right;  Marland Kitchen VIDEO BRONCHOSCOPY WITH ENDOBRONCHIAL ULTRASOUND N/A 06/12/2017   Procedure: VIDEO BRONCHOSCOPY WITH ENDOBRONCHIAL ULTRASOUND;  Surgeon: Grace Isaac, MD;  Location: [facility];  Service: Thoracic;  Laterality: N/A;    REVIEW OF SYSTEMS:  A comprehensive review of systems was negative except for: Ears, nose, mouth, throat, and face: positive for hoarseness Musculoskeletal: positive for arthralgias   PHYSICAL EXAMINATION: General appearance: alert, cooperative and no distress Head: Normocephalic, without obvious abnormality, atraumatic Neck: no adenopathy, no JVD, supple, symmetrical, trachea midline and thyroid not enlarged, symmetric, no tenderness/mass/nodules Lymph nodes: Cervical, supraclavicular, and axillary nodes normal. Resp: clear to auscultation bilaterally Back: symmetric, no curvature. ROM normal. No CVA tenderness. Cardio: regular rate and rhythm, S1, S2 normal, no murmur, click, rub or gallop GI: soft, non-tender; bowel sounds normal; no masses,  no organomegaly Extremities: extremities normal, atraumatic, no cyanosis or edema  ECOG PERFORMANCE STATUS: 1 - Symptomatic but completely ambulatory  Blood pressure 109/67, pulse 82, temperature 97.9 F (36.6 C), temperature source Oral, resp. rate 18, height 5\' 11"  (1.803 m), weight 159 lb 1.6 oz (72.2 kg), SpO2 99 %.  LABORATORY DATA: Lab Results  Component Value Date   WBC 5.4 02/11/2018   HGB 14.5 02/11/2018   HCT 43.0 02/11/2018   MCV 93.7 02/11/2018   PLT 214 02/11/2018      Chemistry      Component Value Date/Time   NA 140 02/11/2018 0949   NA 141 10/29/2017 1049   K 4.1 02/11/2018 0949   K 3.3 (L) 10/29/2017 1049   CL 105 02/11/2018 0949   CO2 28 02/11/2018 0949   CO2 26 10/29/2017 1049   BUN 14 02/11/2018 0949   BUN 11.2  10/29/2017 1049   CREATININE 0.72 02/11/2018 0949   CREATININE 0.7 10/29/2017 1049   GLU 198 08/25/2017      Component Value Date/Time   CALCIUM 9.3 02/11/2018 0949   CALCIUM 9.0 10/29/2017 1049   ALKPHOS 97 02/11/2018 0949   ALKPHOS 82 10/29/2017 1049   AST 12 02/11/2018 0949   AST 10 10/29/2017 1049   ALT 13 02/11/2018 0949   ALT 8 10/29/2017 1049   BILITOT 0.5 02/11/2018 0949   BILITOT 0.22 10/29/2017 1049       RADIOGRAPHIC STUDIES: Dg Hand Complete Left  Result Date: 02/24/2018 CLINICAL DATA:  Injury to the left hand from steering wheel of lawn mower. Left hand pain. Initial encounter. EXAM: LEFT HAND - COMPLETE 3+ VIEW COMPARISON:  None. FINDINGS: There is no evidence of fracture or dislocation. The joint spaces are preserved. The carpal rows are intact, and demonstrate normal alignment. The soft tissues are unremarkable in appearance. IMPRESSION: No evidence of fracture or dislocation. Electronically Signed   By: Garald Balding M.D.   On: 02/24/2018 06:55    ASSESSMENT AND PLAN:  This is a very pleasant 52 years old white male with recently diagnosed stage IIIB/IV non-small cell lung cancer, adenocarcinoma presented with large left hilar mass in addition to mediastinal and left supraclavicular lymphadenopathy as well as suspicious right upper lobe pulmonary nodule diagnosed in August 2018. 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 He underwent a course of concurrent chemoradiation to the lung as well as the epiglottic area under the care of Dr. Tammi Klippel. He is status post 6 cycle. He tolerated this course of treatment well except for the radiation induced esophagitis as well as weight loss and fatigue. The patient had partial response to the previous treatment. He is currently on treatment with Keytruda 200 mg IV every 3 weeks status post 8 cycles. He continues to tolerate the treatment well with no concerning complaints.  I  recommended for him to proceed with cycle #9 today.  I will see him back for follow-up visit in 3 weeks for evaluation after repeating CT scan of the neck, chest, abdomen and pelvis for restaging of his disease. For the right shoulder pain, I will give the patient prescription for Percocet 5/325 mg p.o. every 8 hours as needed for pain. The patient was advised to call immediately if he has any concerning symptoms in the interval. The patient voices understanding of current disease status and treatment options and is in agreement with the current care plan. All questions were answered. The patient knows to call the clinic with any problems, questions or concerns. We can certainly see the patient much sooner if necessary. Disclaimer: This note was dictated with voice recognition software. Similar sounding words can inadvertently be transcribed and may not be corrected upon review.

## 2018-03-04 ENCOUNTER — Ambulatory Visit: Payer: Managed Care, Other (non HMO)

## 2018-03-04 ENCOUNTER — Ambulatory Visit: Payer: Managed Care, Other (non HMO) | Admitting: Internal Medicine

## 2018-03-04 ENCOUNTER — Other Ambulatory Visit: Payer: Managed Care, Other (non HMO)

## 2018-03-09 ENCOUNTER — Other Ambulatory Visit: Payer: Self-pay | Admitting: Internal Medicine

## 2018-03-09 DIAGNOSIS — C3490 Malignant neoplasm of unspecified part of unspecified bronchus or lung: Secondary | ICD-10-CM

## 2018-03-19 ENCOUNTER — Ambulatory Visit: Payer: Managed Care, Other (non HMO) | Admitting: Family

## 2018-03-19 ENCOUNTER — Telehealth: Payer: Self-pay | Admitting: Internal Medicine

## 2018-03-19 ENCOUNTER — Encounter: Payer: Self-pay | Admitting: Family

## 2018-03-19 ENCOUNTER — Other Ambulatory Visit (INDEPENDENT_AMBULATORY_CARE_PROVIDER_SITE_OTHER): Payer: Managed Care, Other (non HMO)

## 2018-03-19 VITALS — BP 108/70 | HR 96 | Temp 98.0°F | Ht 71.0 in | Wt 153.0 lb

## 2018-03-19 DIAGNOSIS — R509 Fever, unspecified: Secondary | ICD-10-CM | POA: Diagnosis not present

## 2018-03-19 LAB — CBC WITH DIFFERENTIAL/PLATELET
Basophils Absolute: 0 10*3/uL (ref 0.0–0.1)
Basophils Relative: 0.3 % (ref 0.0–3.0)
EOS PCT: 0.7 % (ref 0.0–5.0)
Eosinophils Absolute: 0 10*3/uL (ref 0.0–0.7)
HEMATOCRIT: 48.3 % (ref 39.0–52.0)
HEMOGLOBIN: 16.9 g/dL (ref 13.0–17.0)
LYMPHS PCT: 5.6 % — AB (ref 12.0–46.0)
Lymphs Abs: 0.4 10*3/uL — ABNORMAL LOW (ref 0.7–4.0)
MCHC: 35 g/dL (ref 30.0–36.0)
MCV: 93 fl (ref 78.0–100.0)
MONO ABS: 0.8 10*3/uL (ref 0.1–1.0)
MONOS PCT: 12 % (ref 3.0–12.0)
Neutro Abs: 5.3 10*3/uL (ref 1.4–7.7)
Neutrophils Relative %: 81.4 % — ABNORMAL HIGH (ref 43.0–77.0)
Platelets: 233 10*3/uL (ref 150.0–400.0)
RBC: 5.2 Mil/uL (ref 4.22–5.81)
RDW: 13.7 % (ref 11.5–15.5)
WBC: 6.5 10*3/uL (ref 4.0–10.5)

## 2018-03-19 LAB — COMPREHENSIVE METABOLIC PANEL
ALK PHOS: 71 U/L (ref 39–117)
ALT: 12 U/L (ref 0–53)
AST: 16 U/L (ref 0–37)
Albumin: 4.2 g/dL (ref 3.5–5.2)
BUN: 14 mg/dL (ref 6–23)
CALCIUM: 9.5 mg/dL (ref 8.4–10.5)
CHLORIDE: 104 meq/L (ref 96–112)
CO2: 30 mEq/L (ref 19–32)
Creatinine, Ser: 0.79 mg/dL (ref 0.40–1.50)
GFR: 108.85 mL/min (ref >60.00)
Glucose, Bld: 125 mg/dL — ABNORMAL HIGH (ref 70–99)
POTASSIUM: 4.5 meq/L (ref 3.5–5.1)
Sodium: 142 mEq/L (ref 135–145)
TOTAL PROTEIN: 6.9 g/dL (ref 6.0–8.3)
Total Bilirubin: 0.4 mg/dL (ref 0.2–1.2)

## 2018-03-19 LAB — URINALYSIS
HGB URINE DIPSTICK: NEGATIVE
Ketones, ur: NEGATIVE
Leukocytes, UA: NEGATIVE
Nitrite: NEGATIVE
Specific Gravity, Urine: >=1.03 — AB (ref 1.000–1.030)
Urine Glucose: NEGATIVE
Urobilinogen, UA: 0.2 (ref 0.0–1.0)
pH: 6 (ref 5.0–8.0)

## 2018-03-19 MED ORDER — ONDANSETRON 4 MG PO TBDP: 4.0000 mg | ORAL_TABLET | Freq: Three times a day (TID) | ORAL | 0 refills | Status: DC | PRN

## 2018-03-19 NOTE — Progress Notes (Signed)
Trevor Zavala is a 52 y.o. male with the following history as recorded in EpicCare:  Patient Active Problem List   Diagnosis Date Noted  . Anxiety and depression 01/25/2018  . Hoarseness of voice 01/25/2018  . Encounter for antineoplastic immunotherapy 10/08/2017  . 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 Protein-calorie malnutrition, severe 08/09/2017  . Protein-calorie malnutrition, moderate ([place]) 08/07/2017  . Hypokalemia 08/07/2017  . SOB (shortness of breath) 08/07/2017  . Dehydration 08/07/2017  . Port catheter in place 08/03/2017  . 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 Stage III squamous cell carcinoma of left lung ([place]) 06/19/2017  . Encounter for antineoplastic chemotherapy 06/19/2017  . Goals of care, counseling/discussion 06/19/2017  . Lung mass 06/08/2017  . Hemoptysis 06/01/2017  . Weight loss 06/01/2017  . Colon polyps 01/16/2017  . Essential hypertension 01/16/2017  . Generalized anxiety disorder 06/22/2015  . Situational depression 02/03/2014  . Post-operative state 12/02/2013  . Infected epidermoid cyst 10/28/2013  . Lipoma of face 10/18/2013  . Well adult exam 10/07/2013  . Hand eczema 10/07/2013  . Wheezing on auscultation 06/01/2012  . Chest pain 06/01/2012  . Back pain 06/01/2012  . Sebaceous cyst 04/05/2012  . Contracture of palmar fascia (Dupuytren's) 04/05/2012  . Lipoma of forehead 04/05/2012  . Cold sore 03/19/2012  . Rhinitis, chronic 08/22/2011  . OTITIS MEDIA, MUCOID, CHRONIC 12/31/2010  . Dysphagia 08/23/2010  . TOBACCO USER 07/10/2010  . COPD mixed type ([place]) 07/10/2010  . GERD 07/10/2010  . Cough 07/10/2010    Current Outpatient Medications  Medication Sig Dispense Refill  . acyclovir (ZOVIRAX) 400 MG tablet Take 1 tablet (400 mg total) by mouth 3 (three) times daily. 21 tablet 3  . clonazePAM (KLONOPIN) 0.5 MG tablet Take 1-2 tablets (0.5-1 mg total) by mouth 2 (two) times daily as needed for anxiety. 180 tablet 2  .  docusate sodium (COLACE) 100 MG capsule Take 100 mg daily by mouth.    . esomeprazole (NEXIUM) 40 MG capsule 1 po qam 90 capsule 3  . fluticasone furoate-vilanterol (BREO ELLIPTA) 100-25 MCG/INH AEPB INHALE 1 PUFF INTO THE LUNGS DAILY. 1 each 11  . loratadine (CLARITIN) 10 MG tablet Take 10 mg by mouth daily as needed for allergies.    . mirtazapine (REMERON) 30 MG tablet Take 1 tablet (30 mg total) by mouth daily. Take 1-2 hrs before dinner 30 tablet 5  . oxyCODONE-acetaminophen (PERCOCET/ROXICET) 5-325 MG tablet Take 1 tablet by mouth every 8 (eight) hours as needed for severe pain. 30 tablet 0  . nicotine (NICODERM CQ) 21 mg/24hr patch Place 1 patch (21 mg total) onto the skin daily. (Patient not taking: Reported on 03/19/2018) 28 patch 0  . ondansetron (ZOFRAN ODT) 4 MG disintegrating tablet Take 1 tablet (4 mg total) by mouth every 8 (eight) hours as needed for nausea or vomiting. 20 tablet 0   No current facility-administered medications for this visit.     Allergies: Patient has no known allergies.  Past Medical History:  Diagnosis Date  . Anxiety   . COPD (chronic obstructive pulmonary disease) ([place])   . GERD (gastroesophageal reflux disease)   . Headache   . Hypertension   . Pneumonia   . Situational depression   . 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 COLONOSCOPY    . DIRECT LARYNGOSCOPY N/A 06/25/2017   Procedure: DIRECT LARYNGOSCOPY AND BIOPSY;  Surgeon: Rozetta Nunnery,  MD;  Location: [facility];  Service: ENT;  Laterality: N/A;  . EAR CYST EXCISION N/A 11/17/2013   Procedure: SEBACEOUS CYST CHEST;  Surgeon: Marcello Moores A. Cornett, MD;  Location: [facility];  Service: General;  Laterality: N/A;  . IR GASTROSTOMY TUBE MOD SED  08/10/2017  . IR GASTROSTOMY TUBE REMOVAL  09/29/2017  . KNEE ARTHROSCOPY     LEFT  . LIPOMA EXCISION N/A 11/17/2013   Procedure: EXCISION  LIPOMA FOREHEAD;  Surgeon: Joyice Faster. Cornett, MD;  Location: [facility];  Service: General;  Laterality: N/A;  . LUNG BIOPSY Bilateral 06/12/2017   Procedure: LEFT LUNG BIOPSY;  Surgeon: Grace Isaac, MD;  Location: [facility];  Service: Thoracic;  Laterality: Bilateral;  . PORTACATH PLACEMENT Right 07/01/2017   Procedure: INSERTION PORT-A-CATH - RIGHT IJ - placed with Fluoro and Ultrasound;  Surgeon: Grace Isaac, MD;  Location: [facility];  Service: Thoracic;  Laterality: Right;  Marland Kitchen VIDEO BRONCHOSCOPY WITH ENDOBRONCHIAL ULTRASOUND N/A 06/12/2017   Procedure: VIDEO BRONCHOSCOPY WITH ENDOBRONCHIAL ULTRASOUND;  Surgeon: Grace Isaac, MD;  Location: [facility] OR;  Service: Thoracic;  Laterality: N/A;    Family History  Problem Relation Age of Onset  . Breast cancer Mother   . Hypertension Mother   . Colon cancer Mother   . Skin cancer Father   . Hypertension Father   . Colon polyps Father   . Cancer Father 37       b cell lymphoma  . Colon cancer Father     Social History   Tobacco Use  . Smoking status: Current Every Day Smoker    Packs/day: 0.25    Years: 35.00    Pack years: 8.75    Last attempt to quit: 08/10/2017    Years since quitting: 0.6  . Smokeless tobacco: Never Used  . Tobacco comment: down to 4 cig/day01/08/2018   Substance Use Topics  . Alcohol use: Yes    Alcohol/week: 0.0 oz    Comment: SOCIAL    Subjective:  Started 2 days ago with sudden onset of body aches/ headaches; + fever up to 101 when symptoms first started- fever seems to have broken in the past 12 hours; had diarrhea which started yesterday; Woke up this am and started vomiting; has been drinking water, Gatorade, [facility]; no known tick exposure; no home contacts with similar symptoms; no changes in urination; no blood seen in diarrhea; notes that abdomen "feels tight."   Objective:  Vitals:   03/19/18 1104  BP: 108/70  Pulse: 96  Temp: 98 F (36.7 C)  TempSrc: Oral  SpO2: 97%   Weight: 153 lb (69.4 kg)  Height: 5' 11" (1.803 m)    General: Well developed, well nourished, in no acute distress  Skin : Warm and dry.  Head: Normocephalic and atraumatic  Lungs: Respirations unlabored; clear to auscultation bilaterally without wheeze, rales, rhonchi  CVS exam: normal rate and regular rhythm.  Abdomen: Soft; nontender; nondistended; normoactive bowel sounds; no masses or hepatosplenomegaly  Neurologic: Alert and oriented; speech intact; face symmetrical; moves all extremities well; CNII-XII intact without focal deficit  Assessment:  1. Fever, unspecified fever cause     Plan:  Check rapid flu- negative; check CBC, CMP, U/A- essentially normal; suspect viral gastroenteritis; BRAT diet and need for hydration discussed- Gator-ade, [place]; Rx for Zofran to use as needed for nausea; ER precautions for upcoming weekend; follow-up worse, no better.    No follow-ups on file.  Orders  Placed This Encounter  Procedures  . CBC w/Diff    Standing Status:   Future    Number of Occurrences:   1    Standing Expiration Date:   03/19/2019  . Comp Met (CMET)    Standing Status:   Future    Number of Occurrences:   1    Standing Expiration Date:   03/19/2019  . Urinalysis    Standing Status:   Future    Number of Occurrences:   1    Standing Expiration Date:   03/19/2019    Requested Prescriptions   Signed Prescriptions Disp Refills  . ondansetron (ZOFRAN ODT) 4 MG disintegrating tablet 20 tablet 0    Sig: Take 1 tablet (4 mg total) by mouth every 8 (eight) hours as needed for nausea or vomiting.

## 2018-03-19 NOTE — Telephone Encounter (Signed)
Copied from [place] [phone]. Topic: Quick Communication - See Telephone Encounter >> Mar 19, 2018  8:36 AM Boyd Kerbs wrote: CRM for notification. See Telephone encounter for: 03/19/18.  Caren Griffins called to ask doctor the following:  He is on Klonipin and mirtazapine (REMERON) 30 MG tablet.  He took one of the Remeron made him agitated and head feel crazy.  Daughter in law, [place] was thinking  Lexipro may help.  Asking doctor   He has appt. Today and wanted doctor to know before he saw him

## 2018-03-19 NOTE — Telephone Encounter (Signed)
FYI, he seen Trevor Zavala this morning

## 2018-03-22 ENCOUNTER — Ambulatory Visit (HOSPITAL_COMMUNITY)
Admission: RE | Admit: 2018-03-22 | Discharge: 2018-03-22 | Disposition: A | Discharge status: Home / Self Care | Payer: Managed Care, Other (non HMO) | Source: Ambulatory Visit | Attending: Internal Medicine | Admitting: Internal Medicine

## 2018-03-22 ENCOUNTER — Encounter (HOSPITAL_COMMUNITY): Payer: Self-pay

## 2018-03-22 DIAGNOSIS — J439 Emphysema, unspecified: Secondary | ICD-10-CM | POA: Diagnosis not present

## 2018-03-22 DIAGNOSIS — N281 Cyst of kidney, acquired: Secondary | ICD-10-CM | POA: Insufficient documentation

## 2018-03-22 DIAGNOSIS — C3490 Malignant neoplasm of unspecified part of unspecified bronchus or lung: Secondary | ICD-10-CM | POA: Diagnosis not present

## 2018-03-22 DIAGNOSIS — I700 Atherosclerosis of aorta: Secondary | ICD-10-CM | POA: Diagnosis not present

## 2018-03-22 MED ORDER — HEPARIN SOD (PORK) LOCK FLUSH 100 UNIT/ML IV SOLN: 500.0000 [IU] | Freq: Once | INTRAVENOUS | Status: DC

## 2018-03-22 MED ORDER — HEPARIN SOD (PORK) LOCK FLUSH 100 UNIT/ML IV SOLN
INTRAVENOUS | Status: AC
  Administered 2018-03-22: 500 [IU] via INTRAVENOUS
  Filled 2018-03-22: qty 5

## 2018-03-22 MED ORDER — IOHEXOL 300 MG/ML  SOLN
100.0000 mL | Freq: Once | INTRAMUSCULAR | Status: AC | PRN
  Administered 2018-03-22: 100 mL via INTRAVENOUS

## 2018-03-25 ENCOUNTER — Inpatient Hospital Stay: Payer: Managed Care, Other (non HMO)

## 2018-03-25 ENCOUNTER — Telehealth: Payer: Self-pay | Admitting: Internal Medicine

## 2018-03-25 ENCOUNTER — Inpatient Hospital Stay (HOSPITAL_BASED_OUTPATIENT_CLINIC_OR_DEPARTMENT_OTHER): Payer: Managed Care, Other (non HMO) | Admitting: Internal Medicine

## 2018-03-25 ENCOUNTER — Other Ambulatory Visit: Payer: Self-pay | Admitting: *Deleted

## 2018-03-25 ENCOUNTER — Inpatient Hospital Stay: Payer: Managed Care, Other (non HMO) | Attending: Internal Medicine

## 2018-03-25 ENCOUNTER — Encounter: Payer: Self-pay | Admitting: Internal Medicine

## 2018-03-25 VITALS — BP 110/75 | HR 87 | Temp 98.7°F | Resp 17 | Ht 71.0 in | Wt 156.1 lb

## 2018-03-25 DIAGNOSIS — J449 Chronic obstructive pulmonary disease, unspecified: Secondary | ICD-10-CM

## 2018-03-25 DIAGNOSIS — Z5111 Encounter for antineoplastic chemotherapy: Secondary | ICD-10-CM | POA: Insufficient documentation

## 2018-03-25 DIAGNOSIS — F419 Anxiety disorder, unspecified: Secondary | ICD-10-CM | POA: Diagnosis not present

## 2018-03-25 DIAGNOSIS — Z8521 Personal history of malignant neoplasm of larynx: Secondary | ICD-10-CM

## 2018-03-25 DIAGNOSIS — I10 Essential (primary) hypertension: Secondary | ICD-10-CM

## 2018-03-25 DIAGNOSIS — K219 Gastro-esophageal reflux disease without esophagitis: Secondary | ICD-10-CM | POA: Diagnosis not present

## 2018-03-25 DIAGNOSIS — I700 Atherosclerosis of aorta: Secondary | ICD-10-CM | POA: Insufficient documentation

## 2018-03-25 DIAGNOSIS — C3412 Malignant neoplasm of upper lobe, left bronchus or lung: Secondary | ICD-10-CM

## 2018-03-25 DIAGNOSIS — R918 Other nonspecific abnormal finding of lung field: Secondary | ICD-10-CM | POA: Diagnosis not present

## 2018-03-25 DIAGNOSIS — Z79899 Other long term (current) drug therapy: Secondary | ICD-10-CM | POA: Diagnosis not present

## 2018-03-25 DIAGNOSIS — N281 Cyst of kidney, acquired: Secondary | ICD-10-CM | POA: Diagnosis not present

## 2018-03-25 DIAGNOSIS — C321 Malignant neoplasm of supraglottis: Secondary | ICD-10-CM

## 2018-03-25 DIAGNOSIS — Z923 Personal history of irradiation: Secondary | ICD-10-CM | POA: Diagnosis not present

## 2018-03-25 DIAGNOSIS — R590 Localized enlarged lymph nodes: Secondary | ICD-10-CM | POA: Insufficient documentation

## 2018-03-25 DIAGNOSIS — Z5112 Encounter for antineoplastic immunotherapy: Secondary | ICD-10-CM | POA: Diagnosis present

## 2018-03-25 DIAGNOSIS — R5382 Chronic fatigue, unspecified: Secondary | ICD-10-CM

## 2018-03-25 DIAGNOSIS — C799 Secondary malignant neoplasm of unspecified site: Secondary | ICD-10-CM | POA: Diagnosis not present

## 2018-03-25 DIAGNOSIS — Z95828 Presence of other vascular implants and grafts: Secondary | ICD-10-CM

## 2018-03-25 DIAGNOSIS — R634 Abnormal weight loss: Secondary | ICD-10-CM

## 2018-03-25 LAB — CBC WITH DIFFERENTIAL/PLATELET
BASOS PCT: 0 %
Basophils Absolute: 0 10*3/uL (ref 0.0–0.1)
EOS PCT: 1 %
Eosinophils Absolute: 0 10*3/uL (ref 0.0–0.5)
HCT: 40.6 % (ref 38.4–49.9)
Hemoglobin: 14.1 g/dL (ref 13.0–17.1)
Lymphocytes Relative: 13 %
Lymphs Abs: 0.7 10*3/uL — ABNORMAL LOW (ref 0.9–3.3)
MCH: 32.3 pg (ref 27.2–33.4)
MCHC: 34.7 g/dL (ref 32.0–36.0)
MCV: 93.1 fL (ref 79.3–98.0)
Monocytes Absolute: 0.6 10*3/uL (ref 0.1–0.9)
Monocytes Relative: 11 %
NEUTROS ABS: 4 10*3/uL (ref 1.5–6.5)
Neutrophils Relative %: 75 %
PLATELETS: 263 10*3/uL (ref 140–400)
RBC: 4.36 MIL/uL (ref 4.20–5.82)
RDW: 12.9 % (ref 11.0–14.6)
WBC: 5.3 10*3/uL (ref 4.0–10.3)

## 2018-03-25 LAB — COMPREHENSIVE METABOLIC PANEL
ALBUMIN: 3.5 g/dL (ref 3.5–5.0)
ALT: 18 U/L (ref 0–55)
AST: 20 U/L (ref 5–34)
Alkaline Phosphatase: 85 U/L (ref 40–150)
Anion gap: 9 (ref 3–11)
BUN: 6 mg/dL — AB (ref 7–26)
CHLORIDE: 102 mmol/L (ref 98–109)
CO2: 28 mmol/L (ref 22–29)
Calcium: 8.8 mg/dL (ref 8.4–10.4)
Creatinine, Ser: 0.72 mg/dL (ref 0.70–1.30)
GFR calc Af Amer: >60 mL/min (ref >60)
GFR calc non Af Amer: >60 mL/min (ref >60)
GLUCOSE: 121 mg/dL (ref 70–140)
POTASSIUM: 3 mmol/L — AB (ref 3.5–5.1)
Sodium: 139 mmol/L (ref 136–145)
Total Bilirubin: 0.3 mg/dL (ref 0.2–1.2)
Total Protein: 5.9 g/dL — ABNORMAL LOW (ref 6.4–8.3)

## 2018-03-25 LAB — TSH: TSH: 1.614 u[IU]/mL (ref 0.320–4.118)

## 2018-03-25 MED ORDER — POTASSIUM CHLORIDE CRYS ER 20 MEQ PO TBCR: 20.0000 meq | EXTENDED_RELEASE_TABLET | Freq: Every day | ORAL | 0 refills | Status: DC

## 2018-03-25 MED ORDER — SODIUM CHLORIDE 0.9% FLUSH
10.0000 mL | Freq: Once | INTRAVENOUS | Status: AC
  Administered 2018-03-25: 10 mL
  Filled 2018-03-25: qty 10

## 2018-03-25 MED ORDER — SODIUM CHLORIDE 0.9 % IV SOLN
200.0000 mg | Freq: Once | INTRAVENOUS | Status: AC
  Administered 2018-03-25: 200 mg via INTRAVENOUS
  Filled 2018-03-25: qty 8

## 2018-03-25 MED ORDER — SODIUM CHLORIDE 0.9 % IV SOLN
Freq: Once | INTRAVENOUS | Status: AC
  Administered 2018-03-25: 09:00:00 via INTRAVENOUS

## 2018-03-25 MED ORDER — SODIUM CHLORIDE 0.9% FLUSH
10.0000 mL | INTRAVENOUS | Status: DC | PRN
  Administered 2018-03-25: 10 mL
  Filled 2018-03-25: qty 10

## 2018-03-25 MED ORDER — HEPARIN SOD (PORK) LOCK FLUSH 100 UNIT/ML IV SOLN
500.0000 [IU] | Freq: Once | INTRAVENOUS | Status: AC | PRN
  Administered 2018-03-25: 500 [IU]
  Filled 2018-03-25: qty 5

## 2018-03-25 MED FILL — POTASSIUM CL ER 20 MEQ TABL: 20 | 7 days supply | Qty: 7 | Fill #0

## 2018-03-25 NOTE — Telephone Encounter (Signed)
Rx for K+ sent to [facility]. CBC and CMET reviewed with MD, ok to treat.

## 2018-03-25 NOTE — Patient Instructions (Signed)
Pembrolizumab injection  What is this medicine?  PEMBROLIZUMAB (pem broe liz ue mab) is a monoclonal antibody. It is used to treat melanoma, head and neck cancer, Hodgkin lymphoma, non-small cell lung cancer, urothelial cancer, stomach cancer, and cancers that have a certain genetic condition.  This medicine may be used for other purposes; ask your health care provider or pharmacist if you have questions.  COMMON BRAND NAME(S): Keytruda  What should I tell my health care provider before I take this medicine?  They need to know if you have any of these conditions:  -diabetes  -immune system problems  -inflammatory bowel disease  -liver disease  -lung or breathing disease  -lupus  -organ transplant  -an unusual or allergic reaction to pembrolizumab, other medicines, foods, dyes, or preservatives  -pregnant or trying to get pregnant  -breast-feeding  How should I use this medicine?  This medicine is for infusion into a vein. It is given by a health care professional in a hospital or clinic setting.  A special MedGuide will be given to you before each treatment. Be sure to read this information carefully each time.  Talk to your pediatrician regarding the use of this medicine in children. While this drug may be prescribed for selected conditions, precautions do apply.  Overdosage: If you think you have taken too much of this medicine contact a poison control center or emergency room at once.  NOTE: This medicine is only for you. Do not share this medicine with others.  What if I miss a dose?  It is important not to miss your dose. Call your doctor or health care professional if you are unable to keep an appointment.  What may interact with this medicine?  Interactions have not been studied.  Give your health care provider a list of all the medicines, herbs, non-prescription drugs, or dietary supplements you use. Also tell them if you smoke, drink alcohol, or use illegal drugs. Some items may interact with your  medicine.  This list may not describe all possible interactions. Give your health care provider a list of all the medicines, herbs, non-prescription drugs, or dietary supplements you use. Also tell them if you smoke, drink alcohol, or use illegal drugs. Some items may interact with your medicine.  What should I watch for while using this medicine?  Your condition will be monitored carefully while you are receiving this medicine.  You may need blood work done while you are taking this medicine.  Do not become pregnant while taking this medicine or for 4 months after stopping it. Women should inform their doctor if they wish to become pregnant or think they might be pregnant. There is a potential for serious side effects to an unborn child. Talk to your health care professional or pharmacist for more information. Do not breast-feed an infant while taking this medicine or for 4 months after the last dose.  What side effects may I notice from receiving this medicine?  Side effects that you should report to your doctor or health care professional as soon as possible:  -allergic reactions like skin rash, itching or hives, swelling of the face, lips, or tongue  -bloody or black, tarry  -breathing problems  -changes in vision  -chest pain  -chills  -constipation  -cough  -dizziness or feeling faint or lightheaded  -fast or irregular heartbeat  -fever  -flushing  -hair loss  -low blood counts - this medicine may decrease the number of white blood cells, red blood cells   and platelets. You may be at increased risk for infections and bleeding.  -muscle pain  -muscle weakness  -persistent headache  -signs and symptoms of high blood sugar such as dizziness; dry mouth; dry skin; fruity breath; nausea; stomach pain; increased hunger or thirst; increased urination  -signs and symptoms of kidney injury like trouble passing urine or change in the amount of urine  -signs and symptoms of liver injury like dark urine, light-colored  stools, loss of appetite, nausea, right upper belly pain, yellowing of the eyes or skin  -stomach pain  -sweating  -weight loss  Side effects that usually do not require medical attention (report to your doctor or health care professional if they continue or are bothersome):  -decreased appetite  -diarrhea  -tiredness  This list may not describe all possible side effects. Call your doctor for medical advice about side effects. You may report side effects to FDA at 1-800-FDA-1088.  Where should I keep my medicine?  This drug is given in a hospital or clinic and will not be stored at home.  NOTE: This sheet is a summary. It may not cover all possible information. If you have questions about this medicine, talk to your doctor, pharmacist, or health care provider.   2018 Elsevier/Gold Standard (2016-08-05 12:29:36)

## 2018-03-25 NOTE — Progress Notes (Signed)
Reported low K+ to Dr Worthy Flank RN.

## 2018-03-25 NOTE — Progress Notes (Signed)
Trevor Zavala [phone]   [phone]  OFFICE PROGRESS NOTE  Zavala, Trevor Lacks, MD [place] [address]  DIAGNOSIS:  1) Stage IIB/IV (T3, N3, M1a) non-small cell lung cancer, squamous cell carcinoma presented with large left hilar mass in addition to mediastinal and left supraclavicular lymphadenopathy as well as contralateral right upper lobe nodule diagnosed in August 2018. PDL 1 expression: 90%. 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 First dose 06/29/2017. Status post 6 cycles. Last dose was given 08/03/2017.  CURRENT THERAPY: Immunotherapy with Ketruda 200 MG IV every 3 weeks, first dose 09/17/2017.  Status post 9 cycles.  INTERVAL HISTORY: Trevor Zavala 52 y.o. male returns to the clinic today for follow-up visit accompanied by his wife.  The patient is feeling fine today with no specific complaints except for fatigue.  He works full-time 10 hours a day 4 days a week.  He denied having any chest pain, shortness of breath, cough or hemoptysis.  He denied having any weight loss or night sweats.  He has no nausea, vomiting, diarrhea or constipation.  He had an episode of viral gastroenteritis for few days and he lost few pounds during that time.  The patient denied having any headache or visual changes.  He he continues to tolerate his treatment with Keytruda fairly well.  He is here today for evaluation with repeat CT scan of the neck, chest, abdomen and pelvis for restaging of his disease.  MEDICAL HISTORY: Past Medical History:  Diagnosis Date  . Anxiety   . COPD (chronic obstructive pulmonary disease) ([place])   . GERD (gastroesophageal reflux disease)   . Headache   . Hypertension   . Pneumonia   . Situational depression   . Stage III squamous cell carcinoma of left lung ([place]) 06/19/2017     ALLERGIES:  has No Known Allergies.  MEDICATIONS:  Current Outpatient Medications  Medication Sig Dispense Refill  . acyclovir (ZOVIRAX) 400 MG tablet Take 1 tablet (400 mg total) by mouth 3 (three) times daily. 21 tablet 3  . clonazePAM (KLONOPIN) 0.5 MG tablet Take 1-2 tablets (0.5-1 mg total) by mouth 2 (two) times daily as needed for anxiety. 180 tablet 2  . docusate sodium (COLACE) 100 MG capsule Take 100 mg daily by mouth.    . esomeprazole (NEXIUM) 40 MG capsule 1 po qam 90 capsule 3  . fluticasone furoate-vilanterol (BREO ELLIPTA) 100-25 MCG/INH AEPB INHALE 1 PUFF INTO THE LUNGS DAILY. 1 each 11  . loratadine (CLARITIN) 10 MG tablet Take 10 mg by mouth daily as needed for allergies.    . mirtazapine (REMERON) 30 MG tablet Take 1 tablet (30 mg total) by mouth daily. Take 1-2 hrs before dinner 30 tablet 5  . nicotine (NICODERM CQ) 21 mg/24hr patch Place 1 patch (21 mg total) onto the skin daily. 28 patch 0  . ondansetron (ZOFRAN ODT) 4 MG disintegrating tablet Take 1 tablet (4 mg total) by mouth every 8 (eight) hours as needed for nausea or vomiting. 20 tablet 0  . oxyCODONE-acetaminophen (PERCOCET/ROXICET) 5-325 MG tablet Take 1 tablet by mouth every 8 (eight) hours as needed for severe pain. 30 tablet 0   No current facility-administered medications for this visit.     SURGICAL HISTORY:  Past Surgical History:  Procedure Laterality Date  . COLONOSCOPY    .  DIRECT LARYNGOSCOPY N/A 06/25/2017   Procedure: DIRECT LARYNGOSCOPY AND BIOPSY;  Surgeon: Rozetta Nunnery, MD;  Location: [facility];  Service: ENT;  Laterality: N/A;  . EAR CYST EXCISION N/A 11/17/2013   Procedure: SEBACEOUS CYST CHEST;  Surgeon: Joyice Faster. Cornett, MD;  Location: [facility];  Service: General;  Laterality: N/A;  . IR GASTROSTOMY TUBE MOD SED  08/10/2017  . IR GASTROSTOMY TUBE REMOVAL  09/29/2017  . KNEE ARTHROSCOPY     LEFT  . LIPOMA EXCISION N/A 11/17/2013   Procedure:  EXCISION LIPOMA FOREHEAD;  Surgeon: Joyice Faster. Cornett, MD;  Location: [facility];  Service: General;  Laterality: N/A;  . LUNG BIOPSY Bilateral 06/12/2017   Procedure: LEFT LUNG BIOPSY;  Surgeon: Grace Isaac, MD;  Location: [facility];  Service: Thoracic;  Laterality: Bilateral;  . PORTACATH PLACEMENT Right 07/01/2017   Procedure: INSERTION PORT-A-CATH - RIGHT IJ - placed with Fluoro and Ultrasound;  Surgeon: Grace Isaac, MD;  Location: [facility];  Service: Thoracic;  Laterality: Right;  Marland Kitchen VIDEO BRONCHOSCOPY WITH ENDOBRONCHIAL ULTRASOUND N/A 06/12/2017   Procedure: VIDEO BRONCHOSCOPY WITH ENDOBRONCHIAL ULTRASOUND;  Surgeon: Grace Isaac, MD;  Location: [facility];  Service: Thoracic;  Laterality: N/A;    REVIEW OF SYSTEMS:  Constitutional: positive for fatigue Eyes: negative Ears, nose, mouth, throat, and face: negative Respiratory: negative Cardiovascular: negative Gastrointestinal: negative Genitourinary:negative Integument/breast: negative Hematologic/lymphatic: negative Musculoskeletal:negative Neurological: negative Behavioral/Psych: negative Endocrine: negative Allergic/Immunologic: negative   PHYSICAL EXAMINATION: General appearance: alert, cooperative, fatigued and no distress Head: Normocephalic, without obvious abnormality, atraumatic Neck: no adenopathy, no JVD, supple, symmetrical, trachea midline and thyroid not enlarged, symmetric, no tenderness/mass/nodules Lymph nodes: Cervical, supraclavicular, and axillary nodes normal. Resp: clear to auscultation bilaterally Back: symmetric, no curvature. ROM normal. No CVA tenderness. Cardio: regular rate and rhythm, S1, S2 normal, no murmur, click, rub or gallop GI: soft, non-tender; bowel sounds normal; no masses,  no organomegaly Extremities: extremities normal, atraumatic, no cyanosis or edema Neurologic: Alert and oriented X 3, normal strength and tone. Normal symmetric reflexes. Normal coordination and  gait  ECOG PERFORMANCE STATUS: 1 - Symptomatic but completely ambulatory  Blood pressure 110/75, pulse 87, temperature 98.7 F (37.1 C), temperature source Oral, resp. rate 17, height 5\' 11"  (1.803 m), weight 156 lb 1.6 oz (70.8 kg), SpO2 95 %.  LABORATORY DATA: Lab Results  Component Value Date   WBC 5.3 03/25/2018   HGB 14.1 03/25/2018   HCT 40.6 03/25/2018   MCV 93.1 03/25/2018   PLT 263 03/25/2018      Chemistry      Component Value Date/Time   NA 142 03/19/2018 1139   NA 141 10/29/2017 1049   K 4.5 03/19/2018 1139   K 3.3 (L) 10/29/2017 1049   CL 104 03/19/2018 1139   CO2 30 03/19/2018 1139   CO2 26 10/29/2017 1049   BUN 14 03/19/2018 1139   BUN 11.2 10/29/2017 1049   CREATININE 0.79 03/19/2018 1139   CREATININE 0.7 10/29/2017 1049   GLU 198 08/25/2017      Component Value Date/Time   CALCIUM 9.5 03/19/2018 1139   CALCIUM 9.0 10/29/2017 1049   ALKPHOS 71 03/19/2018 1139   ALKPHOS 82 10/29/2017 1049   AST 16 03/19/2018 1139   AST 10 10/29/2017 1049   ALT 12 03/19/2018 1139   ALT 8 10/29/2017 1049   BILITOT 0.4 03/19/2018 1139   BILITOT 0.22 10/29/2017 1049       RADIOGRAPHIC STUDIES: Ct  Soft Tissue Neck W Contrast  Result Date: 03/22/2018 CLINICAL DATA:  Malignant neoplasm of unspecified part of unspecified bronchus or lung. Non-small cell lung cancer. Epiglottis cancer. Left supraclavicular nodal disease. EXAM: CT NECK WITH CONTRAST TECHNIQUE: Multidetector CT imaging of the neck was performed using the standard protocol following the bolus administration of intravenous contrast. CONTRAST:  130mL OMNIPAQUE IOHEXOL 300 MG/ML  SOLN COMPARISON:  CT of the neck 01/19/2018. FINDINGS: Pharynx and larynx: No focal mucosal or submucosal lesions are present. Postradiation changes are present. Edema continues to decrease. No residual or recurrent lesion is present at the base of the epiglottis. Vocal cords are midline and symmetric. Salivary glands: No focal lesion is  present. Thyroid: Mild heterogeneity is stable. No dominant lesion is present. Lymph nodes: Residual scarring at the left supraclavicular station is stable. No residual or recurrent cervical adenopathy is present. Vascular: Mild calcifications are present at the carotid bifurcations bilaterally. There is no significant stenosis. No vascular malformations are present. Limited intracranial: Unremarkable. Visualized orbits: Visualized globes and orbits are within normal limits. Mastoids and visualized paranasal sinuses: The visualized paranasal sinuses and the right mastoid air cells are clear. Left mastoidectomy is noted. Skeleton: Vertebral body heights and alignment are normal. No focal lytic or blastic lesion is present. Upper chest: See dedicated CT of the chest from the same day. IMPRESSION: 1. Treated lymph node versus scar tissue at the left supraclavicular station. 2. No evidence for residual recurrent cervical adenopathy. 3. Post treatment changes at the base of the epiglottis. 4. Minimal atherosclerotic changes at the carotid bifurcations without significant stenosis. Electronically Signed   By: San Morelle M.D.   On: 03/22/2018 09:18   Ct Chest W Contrast  Result Date: 03/22/2018 CLINICAL DATA:  Squamous cell carcinoma of the left lung, completed chemotherapy and radiotherapy in October 2018 with ongoing immuno hand therapy. Cancer of the epiglottis. EXAM: CT CHEST AND ABDOMEN WITH CONTRAST TECHNIQUE: Multidetector CT imaging of the chest and abdomen was performed following the standard protocol during bolus administration of intravenous contrast. CONTRAST:  167mL OMNIPAQUE IOHEXOL 300 MG/ML  SOLN COMPARISON:  Multiple exams, including 01/19/2018, and 09/02/2017 PET-CT. FINDINGS: CT CHEST FINDINGS Cardiovascular: Right Port-A-Cath tip: Right atrium. Mild atherosclerotic calcification of the aortic arch and descending thoracic aorta. Mediastinum/Nodes: No pathologic thoracic adenopathy  identified. Lungs/Pleura: Nodular left suprahilar airway plugging measuring 0.9 by 0.7 cm on image 56/7, stable. Bandlike density compatible with prior radiation therapy in the left suprahilar and perihilar region; compared to 01/19/2018 there is increased density of the anterior component for example on image 58/7, and also some increase in the superior segment left lower lobe component with sub solid bandlike density and some mildly progressive nodularity along this bandlike density and along adjacent bronchovascular structures for example as on images 74-81 of series 7. Paraseptal and centrilobular emphysema. A new bandlike region of nodularity measuring 1.8 by 0.9 cm is observed anteriorly in the right upper lobe on image 44/8. Bilateral airway thickening noted. 2 mm right lower lobe nodule on image 135/7 is new compared to the prior exam. Musculoskeletal: Thoracic spondylosis. CT ABDOMEN PELVIS FINDINGS Hepatobiliary: Unremarkable Pancreas: Unremarkable Spleen: Unremarkable Adrenals/Urinary Tract: 0.9 by 1.5 cm left adrenal nodule was not hypermetabolic on prior PET-CT of 09/02/2017. Hypodense but mildly complex 1.5 by 1.4 by 1.3 cm lesion of the right kidney upper pole has sharply defined margins on the portal venous phase images. This lesion was present and hypoactive with respect to the renal parenchyma on the prior  PET-CT. Other tiny hypodense renal lesions on the right are too small to characterize. Stomach/Bowel: Unremarkable Vascular/Lymphatic: Aortoiliac atherosclerotic vascular disease. Other: No supplemental non-categorized findings. Musculoskeletal: Mild lumbar spondylosis. IMPRESSION: 1. 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 In the setting of progressive density, I cannot completely exclude a component of underlying malignancy and careful surveillance is suggested. 2. Stable nodular left suprahilar airway plugging. 3. New bandlike  1.8 by 0.9 cm nodularity anteriorly in the right upper lobe. New 2 mm right lower lobe pulmonary nodule. Inflammatory versus neoplastic, surveillance recommended. 4. Mildly complex 1.4 cm cystic lesion of the right kidney upper pole was hypoactive on prior PET-CT and is probably a mildly but benign complex cyst. I am unable to exclude mild enhancement given the complexity and lack of precontrast images. If clinically warranted, renal protocol MRI with and without contrast could be utilized to rule out a small renal cell carcinoma. 5. Other imaging findings of potential clinical significance: Aortic Atherosclerosis (ICD10-I70.0) and Emphysema (ICD10-J43.9). Bilateral airway thickening. Electronically Signed   By: Van Clines M.D.   On: 03/22/2018 08:23   Ct Abdomen W Contrast  Result Date: 03/22/2018 CLINICAL DATA:  Squamous cell carcinoma of the left lung, completed chemotherapy and radiotherapy in October 2018 with ongoing immuno hand therapy. Cancer of the epiglottis. EXAM: CT CHEST AND ABDOMEN WITH CONTRAST TECHNIQUE: Multidetector CT imaging of the chest and abdomen was performed following the standard protocol during bolus administration of intravenous contrast. CONTRAST:  159mL OMNIPAQUE IOHEXOL 300 MG/ML  SOLN COMPARISON:  Multiple exams, including 01/19/2018, and 09/02/2017 PET-CT. FINDINGS: CT CHEST FINDINGS Cardiovascular: Right Port-A-Cath tip: Right atrium. Mild atherosclerotic calcification of the aortic arch and descending thoracic aorta. Mediastinum/Nodes: No pathologic thoracic adenopathy identified. Lungs/Pleura: Nodular left suprahilar airway plugging measuring 0.9 by 0.7 cm on image 56/7, stable. Bandlike density compatible with prior radiation therapy in the left suprahilar and perihilar region; compared to 01/19/2018 there is increased density of the anterior component for example on image 58/7, and also some increase in the superior segment left lower lobe component with sub solid  bandlike density and some mildly progressive nodularity along this bandlike density and along adjacent bronchovascular structures for example as on images 74-81 of series 7. Paraseptal and centrilobular emphysema. A new bandlike region of nodularity measuring 1.8 by 0.9 cm is observed anteriorly in the right upper lobe on image 44/8. Bilateral airway thickening noted. 2 mm right lower lobe nodule on image 135/7 is new compared to the prior exam. Musculoskeletal: Thoracic spondylosis. CT ABDOMEN PELVIS FINDINGS Hepatobiliary: Unremarkable Pancreas: Unremarkable Spleen: Unremarkable Adrenals/Urinary Tract: 0.9 by 1.5 cm left adrenal nodule was not hypermetabolic on prior PET-CT of 09/02/2017. Hypodense but mildly complex 1.5 by 1.4 by 1.3 cm lesion of the right kidney upper pole has sharply defined margins on the portal venous phase images. This lesion was present and hypoactive with respect to the renal parenchyma on the prior PET-CT. Other tiny hypodense renal lesions on the right are too small to characterize. Stomach/Bowel: Unremarkable Vascular/Lymphatic: Aortoiliac atherosclerotic vascular disease. Other: No supplemental non-categorized findings. Musculoskeletal: Mild lumbar spondylosis. IMPRESSION: 1. 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 In the setting of progressive density, I cannot completely exclude a component of underlying malignancy and careful surveillance is suggested. 2. Stable nodular left suprahilar airway plugging. 3. New bandlike 1.8 by 0.9 cm nodularity anteriorly in the right upper lobe. New 2 mm right lower  lobe pulmonary nodule. Inflammatory versus neoplastic, surveillance recommended. 4. Mildly complex 1.4 cm cystic lesion of the right kidney upper pole was hypoactive on prior PET-CT and is probably a mildly but benign complex cyst. I am unable to exclude mild enhancement given the complexity and lack of precontrast images.  If clinically warranted, renal protocol MRI with and without contrast could be utilized to rule out a small renal cell carcinoma. 5. Other imaging findings of potential clinical significance: Aortic Atherosclerosis (ICD10-I70.0) and Emphysema (ICD10-J43.9). Bilateral airway thickening. Electronically Signed   By: Van Clines M.D.   On: 03/22/2018 08:23   Dg Hand Complete Left  Result Date: 02/24/2018 CLINICAL DATA:  Injury to the left hand from steering wheel of lawn mower. Left hand pain. Initial encounter. EXAM: LEFT HAND - COMPLETE 3+ VIEW COMPARISON:  None. FINDINGS: There is no evidence of fracture or dislocation. The joint spaces are preserved. The carpal rows are intact, and demonstrate normal alignment. The soft tissues are unremarkable in appearance. IMPRESSION: No evidence of fracture or dislocation. Electronically Signed   By: Garald Balding M.D.   On: 02/24/2018 06:55    ASSESSMENT AND PLAN: This is a very pleasant 52 years old white male with recently diagnosed stage IIIB/IV non-small cell lung cancer, adenocarcinoma presented with large left hilar mass in addition to mediastinal and left supraclavicular lymphadenopathy as well as suspicious right upper lobe pulmonary nodule diagnosed in August 2018. 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 He underwent a course of concurrent chemoradiation to the lung as well as the epiglottic area under the care of Dr. Tammi Klippel. He is status post 6 cycle. He tolerated this course of treatment well except for the radiation induced esophagitis as well as weight loss and fatigue. The patient had partial response to the previous treatment. He is currently on treatment with Keytruda 200 mg IV every 3 weeks status post 9 cycles. The patient continues to tolerate his treatment with [facility] fairly well.  He had repeat CT scan of the neck, chest, abdomen and pelvis performed recently.  I personally and  independently reviewed the scan images and discussed the results and showed the images to the patient and his wife.  His scan showed stable disease except for progressive radiation changes in the lung.  He also has a suspicious cystic lesion in the right kidney questionable for benign complex cyst. I recommended for the patient to continue his current treatment with Keytruda.  We will continue to monitor the suspicious kidney cyst as well as the radiation changes closely on the upcoming scans. I will see the patient back for follow-up visit in 3 weeks for evaluation before starting cycle #11. He was advised to call immediately if he has any concerning symptoms in the interval. The patient voices understanding of current disease status and treatment options and is in agreement with the current care plan. All questions were answered. The patient knows to call the clinic with any problems, questions or concerns. We can certainly see the patient much sooner if necessary. Disclaimer: This note was dictated with voice recognition software. Similar sounding words can inadvertently be transcribed and may not be corrected upon review.

## 2018-03-25 NOTE — Telephone Encounter (Signed)
Appointments scheduled AVS/Calendar priited per 5/16 los

## 2018-04-01 ENCOUNTER — Telehealth: Payer: Self-pay | Admitting: Internal Medicine

## 2018-04-01 NOTE — Telephone Encounter (Signed)
Please advise 

## 2018-04-01 NOTE — Telephone Encounter (Signed)
Caren Griffins ( wife ) called and stated that the mirtazapine (REMERON) 30 MG tablet, he can not take this. He only took pill. Also, he can not take the clonazePAM (KLONOPIN) 0.5 MG tablet and work his job. He is requesting maybe to switch him to lexapro.  Please advise. Call back [phone]

## 2018-04-01 NOTE — Telephone Encounter (Signed)
Copied from [place] [phone]. Topic: Quick Communication - See Telephone Encounter >> Apr 01, 2018 12:37 PM Vernona Rieger wrote: CRM for notification. See Telephone encounter for: 04/01/18.  Caren Griffins ( wife ) called and stated that the mirtazapine (REMERON) 30 MG tablet, he can not take this. He only took pill. Also, he can not take the clonazePAM (KLONOPIN) 0.5 MG tablet and work his job. He is requesting maybe to switch him to lexapro.  Please advise. Call back [phone]

## 2018-04-01 NOTE — Telephone Encounter (Signed)
Okay to start Lexapro 5 mg #30 with 5 refills 1 once a day. Thank you

## 2018-04-02 MED ORDER — ESCITALOPRAM OXALATE 5 MG PO TABS: 5.0000 mg | ORAL_TABLET | Freq: Every day | ORAL | 5 refills | Status: DC

## 2018-04-02 NOTE — Telephone Encounter (Signed)
RX sent, LM notifying pt

## 2018-04-15 ENCOUNTER — Inpatient Hospital Stay: Payer: Managed Care, Other (non HMO)

## 2018-04-15 ENCOUNTER — Inpatient Hospital Stay (HOSPITAL_BASED_OUTPATIENT_CLINIC_OR_DEPARTMENT_OTHER): Payer: Managed Care, Other (non HMO) | Admitting: Internal Medicine

## 2018-04-15 ENCOUNTER — Telehealth: Payer: Self-pay | Admitting: Internal Medicine

## 2018-04-15 ENCOUNTER — Inpatient Hospital Stay: Payer: Managed Care, Other (non HMO) | Attending: Internal Medicine

## 2018-04-15 ENCOUNTER — Encounter: Payer: Self-pay | Admitting: Internal Medicine

## 2018-04-15 VITALS — BP 127/81 | HR 75 | Temp 98.8°F | Resp 18 | Ht 71.0 in | Wt 154.1 lb

## 2018-04-15 DIAGNOSIS — F419 Anxiety disorder, unspecified: Secondary | ICD-10-CM | POA: Diagnosis not present

## 2018-04-15 DIAGNOSIS — I10 Essential (primary) hypertension: Secondary | ICD-10-CM | POA: Diagnosis not present

## 2018-04-15 DIAGNOSIS — Z923 Personal history of irradiation: Secondary | ICD-10-CM

## 2018-04-15 DIAGNOSIS — K219 Gastro-esophageal reflux disease without esophagitis: Secondary | ICD-10-CM

## 2018-04-15 DIAGNOSIS — Z5112 Encounter for antineoplastic immunotherapy: Secondary | ICD-10-CM | POA: Diagnosis present

## 2018-04-15 DIAGNOSIS — Z8521 Personal history of malignant neoplasm of larynx: Secondary | ICD-10-CM

## 2018-04-15 DIAGNOSIS — Z79899 Other long term (current) drug therapy: Secondary | ICD-10-CM

## 2018-04-15 DIAGNOSIS — J449 Chronic obstructive pulmonary disease, unspecified: Secondary | ICD-10-CM | POA: Insufficient documentation

## 2018-04-15 DIAGNOSIS — R490 Dysphonia: Secondary | ICD-10-CM | POA: Diagnosis not present

## 2018-04-15 DIAGNOSIS — C3412 Malignant neoplasm of upper lobe, left bronchus or lung: Secondary | ICD-10-CM | POA: Diagnosis not present

## 2018-04-15 DIAGNOSIS — Z5111 Encounter for antineoplastic chemotherapy: Secondary | ICD-10-CM | POA: Insufficient documentation

## 2018-04-15 DIAGNOSIS — I700 Atherosclerosis of aorta: Secondary | ICD-10-CM | POA: Insufficient documentation

## 2018-04-15 DIAGNOSIS — Z95828 Presence of other vascular implants and grafts: Secondary | ICD-10-CM

## 2018-04-15 DIAGNOSIS — R5382 Chronic fatigue, unspecified: Secondary | ICD-10-CM

## 2018-04-15 DIAGNOSIS — C321 Malignant neoplasm of supraglottis: Secondary | ICD-10-CM

## 2018-04-15 LAB — COMPREHENSIVE METABOLIC PANEL
ALBUMIN: 3.8 g/dL (ref 3.5–5.0)
ALT: 17 U/L (ref 0–55)
AST: 16 U/L (ref 5–34)
Alkaline Phosphatase: 88 U/L (ref 40–150)
Anion gap: 8 (ref 3–11)
BUN: 12 mg/dL (ref 7–26)
CHLORIDE: 106 mmol/L (ref 98–109)
CO2: 26 mmol/L (ref 22–29)
Calcium: 9.3 mg/dL (ref 8.4–10.4)
Creatinine, Ser: 0.74 mg/dL (ref 0.70–1.30)
GFR calc Af Amer: >60 mL/min (ref >60)
Glucose, Bld: 95 mg/dL (ref 70–140)
POTASSIUM: 4.3 mmol/L (ref 3.5–5.1)
Sodium: 140 mmol/L (ref 136–145)
Total Bilirubin: 0.4 mg/dL (ref 0.2–1.2)
Total Protein: 6.3 g/dL — ABNORMAL LOW (ref 6.4–8.3)

## 2018-04-15 LAB — CBC WITH DIFFERENTIAL/PLATELET
Basophils Absolute: 0 10*3/uL (ref 0.0–0.1)
Basophils Relative: 0 %
EOS PCT: 1 %
Eosinophils Absolute: 0.1 10*3/uL (ref 0.0–0.5)
HCT: 44 % (ref 38.4–49.9)
HEMOGLOBIN: 14.9 g/dL (ref 13.0–17.1)
LYMPHS ABS: 0.6 10*3/uL — AB (ref 0.9–3.3)
LYMPHS PCT: 8 %
MCH: 32.7 pg (ref 27.2–33.4)
MCHC: 33.9 g/dL (ref 32.0–36.0)
MCV: 96.7 fL (ref 79.3–98.0)
MONOS PCT: 11 %
Monocytes Absolute: 0.8 10*3/uL (ref 0.1–0.9)
Neutro Abs: 6 10*3/uL (ref 1.5–6.5)
Neutrophils Relative %: 80 %
Platelets: 203 10*3/uL (ref 140–400)
RBC: 4.55 MIL/uL (ref 4.20–5.82)
RDW: 13 % (ref 11.0–14.6)
WBC: 7.5 10*3/uL (ref 4.0–10.3)

## 2018-04-15 LAB — TSH: TSH: 1.253 u[IU]/mL (ref 0.320–4.118)

## 2018-04-15 MED ORDER — SODIUM CHLORIDE 0.9% FLUSH
10.0000 mL | Freq: Once | INTRAVENOUS | Status: AC
  Administered 2018-04-15: 10 mL
  Filled 2018-04-15: qty 10

## 2018-04-15 MED ORDER — HEPARIN SOD (PORK) LOCK FLUSH 100 UNIT/ML IV SOLN
500.0000 [IU] | Freq: Once | INTRAVENOUS | Status: AC | PRN
  Administered 2018-04-15: 500 [IU]
  Filled 2018-04-15: qty 5

## 2018-04-15 MED ORDER — SODIUM CHLORIDE 0.9 % IV SOLN
Freq: Once | INTRAVENOUS | Status: AC
  Administered 2018-04-15: 09:00:00 via INTRAVENOUS

## 2018-04-15 MED ORDER — PEMBROLIZUMAB CHEMO INJECTION 100 MG/4ML
200.0000 mg | Freq: Once | INTRAVENOUS | Status: AC
  Administered 2018-04-15: 200 mg via INTRAVENOUS
  Filled 2018-04-15: qty 8

## 2018-04-15 MED ORDER — SODIUM CHLORIDE 0.9% FLUSH
10.0000 mL | INTRAVENOUS | Status: DC | PRN
  Administered 2018-04-15: 10 mL
  Filled 2018-04-15: qty 10

## 2018-04-15 NOTE — Progress Notes (Signed)
[place] [phone]   [phone]  OFFICE PROGRESS NOTE  Trevor Zavala, Trevor Lacks, MD [place] [address]  DIAGNOSIS:  1) Stage IIB/IV (T3, N3, M1a) non-small cell lung cancer, squamous cell carcinoma presented with large left hilar mass in addition to mediastinal and left supraclavicular lymphadenopathy as well as contralateral right upper lobe nodule diagnosed in August 2018. PDL 1 expression: 90%. 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 First dose 06/29/2017. Status post 6 cycles. Last dose was given 08/03/2017.  CURRENT THERAPY: Immunotherapy with Ketruda 200 MG IV every 3 weeks, first dose 09/17/2017.  Status post 10 cycles.  INTERVAL HISTORY: Trevor Zavala 52 y.o. male returns to the clinic today for follow-up visit.  The patient has no complaints today.  He denied having any chest pain, shortness breath, cough or hemoptysis.  He denied having any weight loss or night sweats.  He has no nausea, vomiting, diarrhea or constipation.  He continues to tolerate his treatment with Keytruda fairly well.  He is here for evaluation before starting cycle #11.  MEDICAL HISTORY: Past Medical History:  Diagnosis Date  . Anxiety   . COPD (chronic obstructive pulmonary disease) ([place])   . GERD (gastroesophageal reflux disease)   . Headache   . Hypertension   . Pneumonia   . Situational depression   . Stage III squamous cell carcinoma of left lung ([place]) 06/19/2017    ALLERGIES:  has No Known Allergies.  MEDICATIONS:  Current Outpatient Medications  Medication Sig Dispense Refill  . acyclovir (ZOVIRAX) 400 MG tablet Take 1 tablet (400 mg total) by mouth 3 (three) times daily. 21 tablet 3  . clonazePAM (KLONOPIN) 0.5 MG tablet Take 1-2 tablets (0.5-1 mg total) by mouth 2 (two) times daily as needed for  anxiety. 180 tablet 2  . docusate sodium (COLACE) 100 MG capsule Take 100 mg daily by mouth.    . escitalopram (LEXAPRO) 5 MG tablet Take 1 tablet (5 mg total) by mouth daily. 30 tablet 5  . esomeprazole (NEXIUM) 40 MG capsule 1 po qam 90 capsule 3  . fluticasone furoate-vilanterol (BREO ELLIPTA) 100-25 MCG/INH AEPB INHALE 1 PUFF INTO THE LUNGS DAILY. 1 each 11  . loratadine (CLARITIN) 10 MG tablet Take 10 mg by mouth daily as needed for allergies.    . mirtazapine (REMERON) 30 MG tablet Take 1 tablet (30 mg total) by mouth daily. Take 1-2 hrs before dinner 30 tablet 5  . nicotine (NICODERM CQ) 21 mg/24hr patch Place 1 patch (21 mg total) onto the skin daily. 28 patch 0  . ondansetron (ZOFRAN ODT) 4 MG disintegrating tablet Take 1 tablet (4 mg total) by mouth every 8 (eight) hours as needed for nausea or vomiting. 20 tablet 0  . oxyCODONE-acetaminophen (PERCOCET/ROXICET) 5-325 MG tablet Take 1 tablet by mouth every 8 (eight) hours as needed for severe pain. 30 tablet 0  . potassium chloride SA (K-DUR,KLOR-CON) 20 MEQ tablet Take 1 tablet (20 mEq total) by mouth daily. 7 tablet 0   No current facility-administered medications for this visit.     SURGICAL HISTORY:  Past Surgical History:  Procedure Laterality Date  . COLONOSCOPY    . DIRECT LARYNGOSCOPY N/A 06/25/2017   Procedure: DIRECT LARYNGOSCOPY AND BIOPSY;  Surgeon: Rozetta Nunnery, MD;  Location: [facility];  Service: ENT;  Laterality: N/A;  . EAR CYST EXCISION N/A 11/17/2013   Procedure: SEBACEOUS CYST CHEST;  Surgeon: Joyice Faster. Cornett, MD;  Location: [facility];  Service: General;  Laterality: N/A;  . IR GASTROSTOMY TUBE MOD SED  08/10/2017  . IR GASTROSTOMY TUBE REMOVAL  09/29/2017  . KNEE ARTHROSCOPY     LEFT  . LIPOMA EXCISION N/A 11/17/2013   Procedure: EXCISION LIPOMA FOREHEAD;  Surgeon: Joyice Faster. Cornett, MD;  Location: [facility];  Service: General;  Laterality: N/A;  . LUNG  BIOPSY Bilateral 06/12/2017   Procedure: LEFT LUNG BIOPSY;  Surgeon: Grace Isaac, MD;  Location: [facility];  Service: Thoracic;  Laterality: Bilateral;  . PORTACATH PLACEMENT Right 07/01/2017   Procedure: INSERTION PORT-A-CATH - RIGHT IJ - placed with Fluoro and Ultrasound;  Surgeon: Grace Isaac, MD;  Location: [facility];  Service: Thoracic;  Laterality: Right;  Marland Kitchen VIDEO BRONCHOSCOPY WITH ENDOBRONCHIAL ULTRASOUND N/A 06/12/2017   Procedure: VIDEO BRONCHOSCOPY WITH ENDOBRONCHIAL ULTRASOUND;  Surgeon: Grace Isaac, MD;  Location: [facility];  Service: Thoracic;  Laterality: N/A;    REVIEW OF SYSTEMS:  A comprehensive review of systems was negative.   PHYSICAL EXAMINATION: General appearance: alert, cooperative and no distress Head: Normocephalic, without obvious abnormality, atraumatic Neck: no adenopathy, no JVD, supple, symmetrical, trachea midline and thyroid not enlarged, symmetric, no tenderness/mass/nodules Lymph nodes: Cervical, supraclavicular, and axillary nodes normal. Resp: clear to auscultation bilaterally Back: symmetric, no curvature. ROM normal. No CVA tenderness. Cardio: regular rate and rhythm, S1, S2 normal, no murmur, click, rub or gallop GI: soft, non-tender; bowel sounds normal; no masses,  no organomegaly Extremities: extremities normal, atraumatic, no cyanosis or edema  ECOG PERFORMANCE STATUS: 1 - Symptomatic but completely ambulatory  Blood pressure 127/81, pulse 75, temperature 98.8 F (37.1 C), temperature source Oral, resp. rate 18, height 5\' 11"  (1.803 m), weight 154 lb 1.6 oz (69.9 kg), SpO2 99 %.  LABORATORY DATA: Lab Results  Component Value Date   WBC 7.5 04/15/2018   HGB 14.9 04/15/2018   HCT 44.0 04/15/2018   MCV 96.7 04/15/2018   PLT 203 04/15/2018      Chemistry      Component Value Date/Time   NA 139 03/25/2018 0750   NA 141 10/29/2017 1049   K 3.0 (LL) 03/25/2018 0750   K 3.3 (L) 10/29/2017 1049   CL 102 03/25/2018 0750   CO2 28  03/25/2018 0750   CO2 26 10/29/2017 1049   BUN 6 (L) 03/25/2018 0750   BUN 11.2 10/29/2017 1049   CREATININE 0.72 03/25/2018 0750   CREATININE 0.7 10/29/2017 1049   GLU 198 08/25/2017      Component Value Date/Time   CALCIUM 8.8 03/25/2018 0750   CALCIUM 9.0 10/29/2017 1049   ALKPHOS 85 03/25/2018 0750   ALKPHOS 82 10/29/2017 1049   AST 20 03/25/2018 0750   AST 10 10/29/2017 1049   ALT 18 03/25/2018 0750   ALT 8 10/29/2017 1049   BILITOT 0.3 03/25/2018 0750   BILITOT 0.22 10/29/2017 1049       RADIOGRAPHIC STUDIES: Ct Soft Tissue Neck W Contrast  Result Date: 03/22/2018 CLINICAL DATA:  Malignant neoplasm of unspecified part of unspecified bronchus or lung. Non-small cell lung cancer. Epiglottis cancer. Left supraclavicular nodal disease. EXAM: CT NECK WITH CONTRAST TECHNIQUE: Multidetector CT imaging of the neck was performed using the standard protocol following the bolus administration of intravenous contrast. CONTRAST:  172mL OMNIPAQUE IOHEXOL 300 MG/ML  SOLN COMPARISON:  CT  of the neck 01/19/2018. FINDINGS: Pharynx and larynx: No focal mucosal or submucosal lesions are present. Postradiation changes are present. Edema continues to decrease. No residual or recurrent lesion is present at the base of the epiglottis. Vocal cords are midline and symmetric. Salivary glands: No focal lesion is present. Thyroid: Mild heterogeneity is stable. No dominant lesion is present. Lymph nodes: Residual scarring at the left supraclavicular station is stable. No residual or recurrent cervical adenopathy is present. Vascular: Mild calcifications are present at the carotid bifurcations bilaterally. There is no significant stenosis. No vascular malformations are present. Limited intracranial: Unremarkable. Visualized orbits: Visualized globes and orbits are within normal limits. Mastoids and visualized paranasal sinuses: The visualized paranasal sinuses and the right mastoid air cells are clear. Left  mastoidectomy is noted. Skeleton: Vertebral body heights and alignment are normal. No focal lytic or blastic lesion is present. Upper chest: See dedicated CT of the chest from the same day. IMPRESSION: 1. Treated lymph node versus scar tissue at the left supraclavicular station. 2. No evidence for residual recurrent cervical adenopathy. 3. Post treatment changes at the base of the epiglottis. 4. Minimal atherosclerotic changes at the carotid bifurcations without significant stenosis. Electronically Signed   By: San Morelle M.D.   On: 03/22/2018 09:18   Ct Chest W Contrast  Result Date: 03/22/2018 CLINICAL DATA:  Squamous cell carcinoma of the left lung, completed chemotherapy and radiotherapy in October 2018 with ongoing immuno hand therapy. Cancer of the epiglottis. EXAM: CT CHEST AND ABDOMEN WITH CONTRAST TECHNIQUE: Multidetector CT imaging of the chest and abdomen was performed following the standard protocol during bolus administration of intravenous contrast. CONTRAST:  135mL OMNIPAQUE IOHEXOL 300 MG/ML  SOLN COMPARISON:  Multiple exams, including 01/19/2018, and 09/02/2017 PET-CT. FINDINGS: CT CHEST FINDINGS Cardiovascular: Right Port-A-Cath tip: Right atrium. Mild atherosclerotic calcification of the aortic arch and descending thoracic aorta. Mediastinum/Nodes: No pathologic thoracic adenopathy identified. Lungs/Pleura: Nodular left suprahilar airway plugging measuring 0.9 by 0.7 cm on image 56/7, stable. Bandlike density compatible with prior radiation therapy in the left suprahilar and perihilar region; compared to 01/19/2018 there is increased density of the anterior component for example on image 58/7, and also some increase in the superior segment left lower lobe component with sub solid bandlike density and some mildly progressive nodularity along this bandlike density and along adjacent bronchovascular structures for example as on images 74-81 of series 7. Paraseptal and centrilobular  emphysema. A new bandlike region of nodularity measuring 1.8 by 0.9 cm is observed anteriorly in the right upper lobe on image 44/8. Bilateral airway thickening noted. 2 mm right lower lobe nodule on image 135/7 is new compared to the prior exam. Musculoskeletal: Thoracic spondylosis. CT ABDOMEN PELVIS FINDINGS Hepatobiliary: Unremarkable Pancreas: Unremarkable Spleen: Unremarkable Adrenals/Urinary Tract: 0.9 by 1.5 cm left adrenal nodule was not hypermetabolic on prior PET-CT of 09/02/2017. Hypodense but mildly complex 1.5 by 1.4 by 1.3 cm lesion of the right kidney upper pole has sharply defined margins on the portal venous phase images. This lesion was present and hypoactive with respect to the renal parenchyma on the prior PET-CT. Other tiny hypodense renal lesions on the right are too small to characterize. Stomach/Bowel: Unremarkable Vascular/Lymphatic: Aortoiliac atherosclerotic vascular disease. Other: No supplemental non-categorized findings. Musculoskeletal: Mild lumbar spondylosis. IMPRESSION: 1. 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 In the setting of progressive density, I cannot completely exclude a component of underlying malignancy and careful surveillance is suggested. 2. Stable nodular  left suprahilar airway plugging. 3. New bandlike 1.8 by 0.9 cm nodularity anteriorly in the right upper lobe. New 2 mm right lower lobe pulmonary nodule. Inflammatory versus neoplastic, surveillance recommended. 4. Mildly complex 1.4 cm cystic lesion of the right kidney upper pole was hypoactive on prior PET-CT and is probably a mildly but benign complex cyst. I am unable to exclude mild enhancement given the complexity and lack of precontrast images. If clinically warranted, renal protocol MRI with and without contrast could be utilized to rule out a small renal cell carcinoma. 5. Other imaging findings of potential clinical significance: Aortic  Atherosclerosis (ICD10-I70.0) and Emphysema (ICD10-J43.9). Bilateral airway thickening. Electronically Signed   By: Van Clines M.D.   On: 03/22/2018 08:23   Ct Abdomen W Contrast  Result Date: 03/22/2018 CLINICAL DATA:  Squamous cell carcinoma of the left lung, completed chemotherapy and radiotherapy in October 2018 with ongoing immuno hand therapy. Cancer of the epiglottis. EXAM: CT CHEST AND ABDOMEN WITH CONTRAST TECHNIQUE: Multidetector CT imaging of the chest and abdomen was performed following the standard protocol during bolus administration of intravenous contrast. CONTRAST:  162mL OMNIPAQUE IOHEXOL 300 MG/ML  SOLN COMPARISON:  Multiple exams, including 01/19/2018, and 09/02/2017 PET-CT. FINDINGS: CT CHEST FINDINGS Cardiovascular: Right Port-A-Cath tip: Right atrium. Mild atherosclerotic calcification of the aortic arch and descending thoracic aorta. Mediastinum/Nodes: No pathologic thoracic adenopathy identified. Lungs/Pleura: Nodular left suprahilar airway plugging measuring 0.9 by 0.7 cm on image 56/7, stable. Bandlike density compatible with prior radiation therapy in the left suprahilar and perihilar region; compared to 01/19/2018 there is increased density of the anterior component for example on image 58/7, and also some increase in the superior segment left lower lobe component with sub solid bandlike density and some mildly progressive nodularity along this bandlike density and along adjacent bronchovascular structures for example as on images 74-81 of series 7. Paraseptal and centrilobular emphysema. A new bandlike region of nodularity measuring 1.8 by 0.9 cm is observed anteriorly in the right upper lobe on image 44/8. Bilateral airway thickening noted. 2 mm right lower lobe nodule on image 135/7 is new compared to the prior exam. Musculoskeletal: Thoracic spondylosis. CT ABDOMEN PELVIS FINDINGS Hepatobiliary: Unremarkable Pancreas: Unremarkable Spleen: Unremarkable Adrenals/Urinary  Tract: 0.9 by 1.5 cm left adrenal nodule was not hypermetabolic on prior PET-CT of 09/02/2017. Hypodense but mildly complex 1.5 by 1.4 by 1.3 cm lesion of the right kidney upper pole has sharply defined margins on the portal venous phase images. This lesion was present and hypoactive with respect to the renal parenchyma on the prior PET-CT. Other tiny hypodense renal lesions on the right are too small to characterize. Stomach/Bowel: Unremarkable Vascular/Lymphatic: Aortoiliac atherosclerotic vascular disease. Other: No supplemental non-categorized findings. Musculoskeletal: Mild lumbar spondylosis. IMPRESSION: 1. 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 In the setting of progressive density, I cannot completely exclude a component of underlying malignancy and careful surveillance is suggested. 2. Stable nodular left suprahilar airway plugging. 3. New bandlike 1.8 by 0.9 cm nodularity anteriorly in the right upper lobe. New 2 mm right lower lobe pulmonary nodule. Inflammatory versus neoplastic, surveillance recommended. 4. Mildly complex 1.4 cm cystic lesion of the right kidney upper pole was hypoactive on prior PET-CT and is probably a mildly but benign complex cyst. I am unable to exclude mild enhancement given the complexity and lack of precontrast images. If clinically warranted, renal protocol MRI with and without contrast could be utilized to rule out a small renal  cell carcinoma. 5. Other imaging findings of potential clinical significance: Aortic Atherosclerosis (ICD10-I70.0) and Emphysema (ICD10-J43.9). Bilateral airway thickening. Electronically Signed   By: Van Clines M.D.   On: 03/22/2018 08:23    ASSESSMENT AND PLAN: This is a very pleasant 52 years old white male with recently diagnosed stage IIIB/IV non-small cell lung cancer, adenocarcinoma presented with large left hilar mass in addition to mediastinal and left  supraclavicular lymphadenopathy as well as suspicious right upper lobe pulmonary nodule diagnosed in August 2018. 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 He underwent a course of concurrent chemoradiation to the lung as well as the epiglottic area under the care of Dr. Tammi Klippel. He is status post 6 cycle. He tolerated this course of treatment well except for the radiation induced esophagitis as well as weight loss and fatigue. The patient had partial response to the previous treatment. He is currently on treatment with Keytruda 200 mg IV every 3 weeks status post 10 cycles. He has been tolerating this treatment well with no concerning complaints. I recommended for him to proceed with cycle #11 today as a schedule.  I will see him back for follow-up visit in 3 weeks for evaluation before starting cycle #12.  The patient was advised to call immediately if he has any concerning symptoms in the interval. The patient voices understanding of current disease status and treatment options and is in agreement with the current care plan. All questions were answered. The patient knows to call the clinic with any problems, questions or concerns. We can certainly see the patient much sooner if necessary. Disclaimer: This note was dictated with voice recognition software. Similar sounding words can inadvertently be transcribed and may not be corrected upon review.

## 2018-04-15 NOTE — Telephone Encounter (Signed)
3 cycles already scheduled per 6/6 los.

## 2018-04-15 NOTE — Patient Instructions (Signed)
[facility] Discharge Instructions for Patients Receiving Chemotherapy  Today you received the following chemotherapy agents :  Keytruda.  To help prevent nausea and vomiting after your treatment, we encourage you to take your nausea medication as prescribed.   If you develop nausea and vomiting that is not controlled by your nausea medication, call the clinic.   BELOW ARE SYMPTOMS THAT SHOULD BE REPORTED IMMEDIATELY:  *FEVER GREATER THAN 100.5 F  *CHILLS WITH OR WITHOUT FEVER  NAUSEA AND VOMITING THAT IS NOT CONTROLLED WITH YOUR NAUSEA MEDICATION  *UNUSUAL SHORTNESS OF BREATH  *UNUSUAL BRUISING OR BLEEDING  TENDERNESS IN MOUTH AND THROAT WITH OR WITHOUT PRESENCE OF ULCERS  *URINARY PROBLEMS  *BOWEL PROBLEMS  UNUSUAL RASH Items with * indicate a potential emergency and should be followed up as soon as possible.  Feel free to call the clinic should you have any questions or concerns. The clinic phone number is [phone].  Please show the CHEMO ALERT CARD at check-in to the Emergency Department and triage nurse.  

## 2018-04-22 ENCOUNTER — Telehealth: Payer: Self-pay | Admitting: Internal Medicine

## 2018-04-22 NOTE — Telephone Encounter (Signed)
fyi

## 2018-04-22 NOTE — Telephone Encounter (Signed)
Copied from [place] [id number]. Topic: Quick Communication - See Telephone Encounter >> Apr 22, 2018 12:12 PM Selinda Flavin B, NT wrote: CRM for notification. See Telephone encounter for: 04/22/18. Patient's wife Trevor Zavala states that the patient had tried the lexapro and did not have good luck.States that he did not have good thoughts. "Weird thoughts and feelings". Went back to the clonazePAM (KLONOPIN) 0.5 MG tablet. States that he would like to continue taking the klonopin.  CB#:[phone]

## 2018-04-23 ENCOUNTER — Ambulatory Visit: Payer: Managed Care, Other (non HMO) | Admitting: Internal Medicine

## 2018-04-23 NOTE — Telephone Encounter (Signed)
Noted OK Thx

## 2018-05-06 ENCOUNTER — Encounter: Payer: Self-pay | Admitting: *Deleted

## 2018-05-06 ENCOUNTER — Inpatient Hospital Stay (HOSPITAL_BASED_OUTPATIENT_CLINIC_OR_DEPARTMENT_OTHER): Payer: Managed Care, Other (non HMO) | Admitting: Internal Medicine

## 2018-05-06 ENCOUNTER — Inpatient Hospital Stay: Payer: Managed Care, Other (non HMO)

## 2018-05-06 ENCOUNTER — Encounter: Payer: Self-pay | Admitting: Internal Medicine

## 2018-05-06 ENCOUNTER — Telehealth: Payer: Self-pay | Admitting: Internal Medicine

## 2018-05-06 VITALS — BP 118/77 | HR 79 | Temp 98.2°F | Resp 18 | Ht 71.0 in | Wt 154.9 lb

## 2018-05-06 DIAGNOSIS — C3490 Malignant neoplasm of unspecified part of unspecified bronchus or lung: Secondary | ICD-10-CM

## 2018-05-06 DIAGNOSIS — Z923 Personal history of irradiation: Secondary | ICD-10-CM | POA: Diagnosis not present

## 2018-05-06 DIAGNOSIS — J449 Chronic obstructive pulmonary disease, unspecified: Secondary | ICD-10-CM | POA: Diagnosis not present

## 2018-05-06 DIAGNOSIS — F419 Anxiety disorder, unspecified: Secondary | ICD-10-CM

## 2018-05-06 DIAGNOSIS — I10 Essential (primary) hypertension: Secondary | ICD-10-CM | POA: Diagnosis not present

## 2018-05-06 DIAGNOSIS — C321 Malignant neoplasm of supraglottis: Secondary | ICD-10-CM

## 2018-05-06 DIAGNOSIS — R490 Dysphonia: Secondary | ICD-10-CM

## 2018-05-06 DIAGNOSIS — Z79899 Other long term (current) drug therapy: Secondary | ICD-10-CM | POA: Diagnosis not present

## 2018-05-06 DIAGNOSIS — I700 Atherosclerosis of aorta: Secondary | ICD-10-CM | POA: Diagnosis not present

## 2018-05-06 DIAGNOSIS — Z5112 Encounter for antineoplastic immunotherapy: Secondary | ICD-10-CM | POA: Diagnosis not present

## 2018-05-06 DIAGNOSIS — Z8521 Personal history of malignant neoplasm of larynx: Secondary | ICD-10-CM

## 2018-05-06 DIAGNOSIS — R5382 Chronic fatigue, unspecified: Secondary | ICD-10-CM

## 2018-05-06 DIAGNOSIS — C3412 Malignant neoplasm of upper lobe, left bronchus or lung: Secondary | ICD-10-CM

## 2018-05-06 DIAGNOSIS — K219 Gastro-esophageal reflux disease without esophagitis: Secondary | ICD-10-CM | POA: Diagnosis not present

## 2018-05-06 DIAGNOSIS — Z95828 Presence of other vascular implants and grafts: Secondary | ICD-10-CM

## 2018-05-06 LAB — CBC WITH DIFFERENTIAL/PLATELET
Basophils Absolute: 0 10*3/uL (ref 0.0–0.1)
Basophils Relative: 0 %
Eosinophils Absolute: 0 10*3/uL (ref 0.0–0.5)
Eosinophils Relative: 1 %
HEMATOCRIT: 43.4 % (ref 38.4–49.9)
Hemoglobin: 14.7 g/dL (ref 13.0–17.1)
LYMPHS PCT: 10 %
Lymphs Abs: 0.5 10*3/uL — ABNORMAL LOW (ref 0.9–3.3)
MCH: 32.8 pg (ref 27.2–33.4)
MCHC: 33.9 g/dL (ref 32.0–36.0)
MCV: 96.9 fL (ref 79.3–98.0)
MONO ABS: 0.5 10*3/uL (ref 0.1–0.9)
Monocytes Relative: 10 %
NEUTROS ABS: 4.3 10*3/uL (ref 1.5–6.5)
Neutrophils Relative %: 79 %
Platelets: 207 10*3/uL (ref 140–400)
RBC: 4.48 MIL/uL (ref 4.20–5.82)
RDW: 12.9 % (ref 11.0–14.6)
WBC: 5.3 10*3/uL (ref 4.0–10.3)

## 2018-05-06 LAB — COMPREHENSIVE METABOLIC PANEL
ALK PHOS: 80 U/L (ref 38–126)
ALT: 17 U/L (ref 0–44)
ANION GAP: 9 (ref 5–15)
AST: 13 U/L — ABNORMAL LOW (ref 15–41)
Albumin: 3.7 g/dL (ref 3.5–5.0)
BUN: 8 mg/dL (ref 6–20)
CALCIUM: 9.4 mg/dL (ref 8.9–10.3)
CO2: 27 mmol/L (ref 22–32)
Chloride: 103 mmol/L (ref 98–111)
Creatinine, Ser: 0.67 mg/dL (ref 0.61–1.24)
GFR calc Af Amer: >60 mL/min (ref >60)
GFR calc non Af Amer: >60 mL/min (ref >60)
Glucose, Bld: 120 mg/dL — ABNORMAL HIGH (ref 70–99)
POTASSIUM: 3.9 mmol/L (ref 3.5–5.1)
Sodium: 139 mmol/L (ref 135–145)
Total Bilirubin: 0.4 mg/dL (ref 0.3–1.2)
Total Protein: 6.4 g/dL — ABNORMAL LOW (ref 6.5–8.1)

## 2018-05-06 LAB — TSH: TSH: 1.661 u[IU]/mL (ref 0.320–4.118)

## 2018-05-06 MED ORDER — SODIUM CHLORIDE 0.9 % IV SOLN
200.0000 mg | Freq: Once | INTRAVENOUS | Status: AC
  Administered 2018-05-06: 200 mg via INTRAVENOUS
  Filled 2018-05-06: qty 8

## 2018-05-06 MED ORDER — HEPARIN SOD (PORK) LOCK FLUSH 100 UNIT/ML IV SOLN
500.0000 [IU] | Freq: Once | INTRAVENOUS | Status: AC | PRN
  Administered 2018-05-06: 500 [IU]
  Filled 2018-05-06: qty 5

## 2018-05-06 MED ORDER — SODIUM CHLORIDE 0.9 % IV SOLN
Freq: Once | INTRAVENOUS | Status: AC
  Administered 2018-05-06: 09:00:00 via INTRAVENOUS

## 2018-05-06 MED ORDER — SODIUM CHLORIDE 0.9% FLUSH
10.0000 mL | Freq: Once | INTRAVENOUS | Status: AC
  Administered 2018-05-06: 10 mL
  Filled 2018-05-06: qty 10

## 2018-05-06 MED ORDER — SODIUM CHLORIDE 0.9% FLUSH
10.0000 mL | INTRAVENOUS | Status: DC | PRN
  Administered 2018-05-06: 10 mL
  Filled 2018-05-06: qty 10

## 2018-05-06 NOTE — Patient Instructions (Signed)
[facility] Discharge Instructions for Patients Receiving Chemotherapy  Today you received the following chemotherapy agents :  Keytruda.  To help prevent nausea and vomiting after your treatment, we encourage you to take your nausea medication as prescribed.   If you develop nausea and vomiting that is not controlled by your nausea medication, call the clinic.   BELOW ARE SYMPTOMS THAT SHOULD BE REPORTED IMMEDIATELY:  *FEVER GREATER THAN 100.5 F  *CHILLS WITH OR WITHOUT FEVER  NAUSEA AND VOMITING THAT IS NOT CONTROLLED WITH YOUR NAUSEA MEDICATION  *UNUSUAL SHORTNESS OF BREATH  *UNUSUAL BRUISING OR BLEEDING  TENDERNESS IN MOUTH AND THROAT WITH OR WITHOUT PRESENCE OF ULCERS  *URINARY PROBLEMS  *BOWEL PROBLEMS  UNUSUAL RASH Items with * indicate a potential emergency and should be followed up as soon as possible.  Feel free to call the clinic should you have any questions or concerns. The clinic phone number is [phone].  Please show the CHEMO ALERT CARD at check-in to the Emergency Department and triage nurse.  

## 2018-05-06 NOTE — Telephone Encounter (Signed)
Scheduled appt per 6/27 los - gave patient aVS and calender per los. Central radiology to contact patient with ct scan

## 2018-05-06 NOTE — Progress Notes (Signed)
[facility] [phone]   [phone]  OFFICE PROGRESS NOTE  [name], [name], MD [place] [address]  DIAGNOSIS:  1) Stage IIB/IV (T3, N3, M1a) non-small cell lung cancer, squamous cell carcinoma presented with large left hilar mass in addition to mediastinal and left supraclavicular lymphadenopathy as well as contralateral right upper lobe nodule diagnosed in August 2018. PDL 1 expression: 90%. 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 First dose 06/29/2017. Status post 6 cycles. Last dose was given 08/03/2017.  CURRENT THERAPY: Immunotherapy with Ketruda 200 MG IV every 3 weeks, first dose 09/17/2017.  Status post 11 cycles.  INTERVAL HISTORY: Trevor Zavala 52 y.o. male returns to the clinic today for follow-up visit.  The patient is feeling fine with no specific complaints except for the persistent hoarseness of his voice.  He denied having any chest pain, shortness of breath, cough or hemoptysis.  He denied having any fever or chills.  He has no nausea, vomiting, diarrhea or constipation.  He denied having any recent weight loss or night sweats.  The patient is here today for evaluation before starting cycle #12.  MEDICAL HISTORY: Past Medical History:  Diagnosis Date  . Anxiety   . COPD (chronic obstructive pulmonary disease) ([facility])   . GERD (gastroesophageal reflux disease)   . Headache   . Hypertension   . Pneumonia   . Situational depression   . Stage III squamous cell carcinoma of left lung ([facility]) 06/19/2017    ALLERGIES:  is allergic to lexapro [escitalopram oxalate].  MEDICATIONS:  Current Outpatient Medications  Medication Sig Dispense Refill  . clonazePAM (KLONOPIN) 0.5 MG tablet Take 1-2 tablets (0.5-1 mg total) by mouth 2 (two) times daily as needed for anxiety. 180 tablet 2    . docusate sodium (COLACE) 100 MG capsule Take 100 mg daily by mouth.    . esomeprazole (NEXIUM) 40 MG capsule 1 po qam 90 capsule 3  . fluticasone furoate-vilanterol (BREO ELLIPTA) 100-25 MCG/INH AEPB INHALE 1 PUFF INTO THE LUNGS DAILY. 1 each 11  . loratadine (CLARITIN) 10 MG tablet Take 10 mg by mouth daily as needed for allergies.    Marland Kitchen acyclovir (ZOVIRAX) 400 MG tablet Take 1 tablet (400 mg total) by mouth 3 (three) times daily. (Patient not taking: Reported on 05/06/2018) 21 tablet 3  . ondansetron (ZOFRAN ODT) 4 MG disintegrating tablet Take 1 tablet (4 mg total) by mouth every 8 (eight) hours as needed for nausea or vomiting. (Patient not taking: Reported on 05/06/2018) 20 tablet 0  . oxyCODONE-acetaminophen (PERCOCET/ROXICET) 5-325 MG tablet Take 1 tablet by mouth every 8 (eight) hours as needed for severe pain. (Patient not taking: Reported on 05/06/2018) 30 tablet 0   No current facility-administered medications for this visit.     SURGICAL HISTORY:  Past Surgical History:  Procedure Laterality Date  . COLONOSCOPY    . DIRECT LARYNGOSCOPY N/A 06/25/2017   Procedure: DIRECT LARYNGOSCOPY AND BIOPSY;  Surgeon: Rozetta Nunnery, MD;  Location: [facility];  Service: ENT;  Laterality: N/A;  . EAR CYST EXCISION N/A 11/17/2013   Procedure: SEBACEOUS CYST CHEST;  Surgeon: Joyice Faster. Cornett, MD;  Location: [facility];  Service: General;  Laterality: N/A;  . IR GASTROSTOMY TUBE MOD SED  08/10/2017  . IR GASTROSTOMY TUBE REMOVAL  09/29/2017  .  KNEE ARTHROSCOPY     LEFT  . LIPOMA EXCISION N/A 11/17/2013   Procedure: EXCISION LIPOMA FOREHEAD;  Surgeon: Joyice Faster. Cornett, MD;  Location: [facility];  Service: General;  Laterality: N/A;  . LUNG BIOPSY Bilateral 06/12/2017   Procedure: LEFT LUNG BIOPSY;  Surgeon: Grace Isaac, MD;  Location: [facility];  Service: Thoracic;  Laterality: Bilateral;  . PORTACATH PLACEMENT Right 07/01/2017   Procedure:  INSERTION PORT-A-CATH - RIGHT IJ - placed with Fluoro and Ultrasound;  Surgeon: Grace Isaac, MD;  Location: [facility];  Service: Thoracic;  Laterality: Right;  Marland Kitchen VIDEO BRONCHOSCOPY WITH ENDOBRONCHIAL ULTRASOUND N/A 06/12/2017   Procedure: VIDEO BRONCHOSCOPY WITH ENDOBRONCHIAL ULTRASOUND;  Surgeon: Grace Isaac, MD;  Location: [facility];  Service: Thoracic;  Laterality: N/A;    REVIEW OF SYSTEMS:  A comprehensive review of systems was negative except for: Ears, nose, mouth, throat, and face: positive for hoarseness   PHYSICAL EXAMINATION: General appearance: alert, cooperative and no distress Head: Normocephalic, without obvious abnormality, atraumatic Neck: no adenopathy, no JVD, supple, symmetrical, trachea midline and thyroid not enlarged, symmetric, no tenderness/mass/nodules Lymph nodes: Cervical, supraclavicular, and axillary nodes normal. Resp: clear to auscultation bilaterally Back: symmetric, no curvature. ROM normal. No CVA tenderness. Cardio: regular rate and rhythm, S1, S2 normal, no murmur, click, rub or gallop GI: soft, non-tender; bowel sounds normal; no masses,  no organomegaly Extremities: extremities normal, atraumatic, no cyanosis or edema  ECOG PERFORMANCE STATUS: 1 - Symptomatic but completely ambulatory  Blood pressure 118/77, pulse 79, temperature 98.2 F (36.8 C), temperature source Oral, resp. rate 18, height 5\' 11"  (1.803 m), weight 154 lb 14.4 oz (70.3 kg), SpO2 98 %.  LABORATORY DATA: Lab Results  Component Value Date   WBC 5.3 05/06/2018   HGB 14.7 05/06/2018   HCT 43.4 05/06/2018   MCV 96.9 05/06/2018   PLT 207 05/06/2018      Chemistry      Component Value Date/Time   NA 140 04/15/2018 0814   NA 141 10/29/2017 1049   K 4.3 04/15/2018 0814   K 3.3 (L) 10/29/2017 1049   CL 106 04/15/2018 0814   CO2 26 04/15/2018 0814   CO2 26 10/29/2017 1049   BUN 12 04/15/2018 0814   BUN 11.2 10/29/2017 1049   CREATININE 0.74 04/15/2018 0814   CREATININE  0.7 10/29/2017 1049   GLU 198 08/25/2017      Component Value Date/Time   CALCIUM 9.3 04/15/2018 0814   CALCIUM 9.0 10/29/2017 1049   ALKPHOS 88 04/15/2018 0814   ALKPHOS 82 10/29/2017 1049   AST 16 04/15/2018 0814   AST 10 10/29/2017 1049   ALT 17 04/15/2018 0814   ALT 8 10/29/2017 1049   BILITOT 0.4 04/15/2018 0814   BILITOT 0.22 10/29/2017 1049       RADIOGRAPHIC STUDIES: No results found.  ASSESSMENT AND PLAN: This is a very pleasant 52 years old white male with recently diagnosed stage IIIB/IV non-small cell lung cancer, adenocarcinoma presented with large left hilar mass in addition to mediastinal and left supraclavicular lymphadenopathy as well as suspicious right upper lobe pulmonary nodule diagnosed in August 2018. 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 He underwent a course of concurrent chemoradiation to the lung as well as the epiglottic area under the care of Dr. Tammi Klippel. He is status post 6 cycle. He tolerated this course of treatment well except for the radiation induced esophagitis as well as weight loss  and fatigue. The patient had partial response to the previous treatment. He is currently on treatment with Keytruda 200 mg IV every 3 weeks status post 11 cycles. The patient continues to tolerate his treatment with immunotherapy fairly well. I recommended for him to proceed with cycle #12 today as scheduled. I will see him back for follow-up visit in 3 weeks for evaluation before starting cycle #13 after repeating CT scan of the neck and chest for restaging of his disease. For the hoarseness of his voice he is followed by Dr. Redmond Baseman from ENT. The patient was advised to call immediately if he has any concerning symptoms in the interval. The patient voices understanding of current disease status and treatment options and is in agreement with the current care plan. All questions were answered. The patient knows to call the  clinic with any problems, questions or concerns. We can certainly see the patient much sooner if necessary. Disclaimer: This note was dictated with voice recognition software. Similar sounding words can inadvertently be transcribed and may not be corrected upon review.

## 2018-05-11 ENCOUNTER — Telehealth: Payer: Self-pay | Admitting: Internal Medicine

## 2018-05-11 NOTE — Telephone Encounter (Signed)
Faxed medical records to [facility] @ [phone]. Release CL#[id number]

## 2018-05-18 ENCOUNTER — Telehealth: Payer: Self-pay | Admitting: Medical Oncology

## 2018-05-18 NOTE — Telephone Encounter (Signed)
Needs clarification -Does CT abd/pelvis need to be done with CT chest / head/neck  I left message that CT abd/pelvis order cancelled as it was just completed 03/22/18.

## 2018-05-25 ENCOUNTER — Encounter (HOSPITAL_COMMUNITY): Payer: Self-pay

## 2018-05-25 ENCOUNTER — Ambulatory Visit (HOSPITAL_COMMUNITY)
Admission: RE | Admit: 2018-05-25 | Discharge: 2018-05-25 | Disposition: A | Discharge status: Home / Self Care | Payer: Managed Care, Other (non HMO) | Source: Ambulatory Visit | Attending: Internal Medicine | Admitting: Internal Medicine

## 2018-05-25 DIAGNOSIS — J438 Other emphysema: Secondary | ICD-10-CM | POA: Insufficient documentation

## 2018-05-25 DIAGNOSIS — C3490 Malignant neoplasm of unspecified part of unspecified bronchus or lung: Secondary | ICD-10-CM

## 2018-05-25 DIAGNOSIS — J432 Centrilobular emphysema: Secondary | ICD-10-CM | POA: Insufficient documentation

## 2018-05-25 DIAGNOSIS — I700 Atherosclerosis of aorta: Secondary | ICD-10-CM | POA: Diagnosis not present

## 2018-05-25 DIAGNOSIS — N2889 Other specified disorders of kidney and ureter: Secondary | ICD-10-CM | POA: Diagnosis not present

## 2018-05-25 MED ORDER — HEPARIN SOD (PORK) LOCK FLUSH 100 UNIT/ML IV SOLN
INTRAVENOUS | Status: AC
  Administered 2018-05-25: 500 [IU] via INTRAVENOUS
  Filled 2018-05-25: qty 5

## 2018-05-25 MED ORDER — HEPARIN SOD (PORK) LOCK FLUSH 100 UNIT/ML IV SOLN: 500.0000 [IU] | Freq: Once | INTRAVENOUS | Status: DC

## 2018-05-25 MED ORDER — IOHEXOL 300 MG/ML  SOLN
75.0000 mL | Freq: Once | INTRAMUSCULAR | Status: AC | PRN
  Administered 2018-05-25: 75 mL via INTRAVENOUS

## 2018-05-27 ENCOUNTER — Telehealth: Payer: Self-pay | Admitting: Internal Medicine

## 2018-05-27 ENCOUNTER — Inpatient Hospital Stay: Payer: Managed Care, Other (non HMO)

## 2018-05-27 ENCOUNTER — Inpatient Hospital Stay: Payer: Managed Care, Other (non HMO) | Attending: Internal Medicine

## 2018-05-27 ENCOUNTER — Telehealth: Payer: Self-pay | Admitting: *Deleted

## 2018-05-27 ENCOUNTER — Encounter: Payer: Self-pay | Admitting: Urology

## 2018-05-27 ENCOUNTER — Other Ambulatory Visit: Payer: Self-pay

## 2018-05-27 ENCOUNTER — Inpatient Hospital Stay (HOSPITAL_BASED_OUTPATIENT_CLINIC_OR_DEPARTMENT_OTHER): Payer: Managed Care, Other (non HMO) | Admitting: Internal Medicine

## 2018-05-27 ENCOUNTER — Ambulatory Visit
Admission: RE | Admit: 2018-05-27 | Discharge: 2018-05-27 | Disposition: A | Discharge status: Home / Self Care | Payer: Managed Care, Other (non HMO) | Source: Ambulatory Visit | Attending: Urology | Admitting: Urology

## 2018-05-27 ENCOUNTER — Encounter: Payer: Self-pay | Admitting: Internal Medicine

## 2018-05-27 VITALS — BP 126/79 | HR 76 | Temp 98.1°F | Resp 20 | Ht 71.0 in | Wt 157.2 lb

## 2018-05-27 VITALS — BP 127/84 | HR 80 | Temp 98.9°F | Resp 18 | Ht 71.0 in | Wt 157.6 lb

## 2018-05-27 DIAGNOSIS — Z923 Personal history of irradiation: Secondary | ICD-10-CM | POA: Insufficient documentation

## 2018-05-27 DIAGNOSIS — Z5112 Encounter for antineoplastic immunotherapy: Secondary | ICD-10-CM | POA: Diagnosis not present

## 2018-05-27 DIAGNOSIS — C321 Malignant neoplasm of supraglottis: Secondary | ICD-10-CM

## 2018-05-27 DIAGNOSIS — R490 Dysphonia: Secondary | ICD-10-CM | POA: Insufficient documentation

## 2018-05-27 DIAGNOSIS — Z9221 Personal history of antineoplastic chemotherapy: Secondary | ICD-10-CM | POA: Diagnosis not present

## 2018-05-27 DIAGNOSIS — Z79899 Other long term (current) drug therapy: Secondary | ICD-10-CM | POA: Insufficient documentation

## 2018-05-27 DIAGNOSIS — R21 Rash and other nonspecific skin eruption: Secondary | ICD-10-CM | POA: Insufficient documentation

## 2018-05-27 DIAGNOSIS — C3412 Malignant neoplasm of upper lobe, left bronchus or lung: Secondary | ICD-10-CM

## 2018-05-27 DIAGNOSIS — Z888 Allergy status to other drugs, medicaments and biological substances status: Secondary | ICD-10-CM | POA: Diagnosis not present

## 2018-05-27 DIAGNOSIS — K219 Gastro-esophageal reflux disease without esophagitis: Secondary | ICD-10-CM

## 2018-05-27 DIAGNOSIS — J449 Chronic obstructive pulmonary disease, unspecified: Secondary | ICD-10-CM | POA: Diagnosis not present

## 2018-05-27 DIAGNOSIS — Z95828 Presence of other vascular implants and grafts: Secondary | ICD-10-CM

## 2018-05-27 DIAGNOSIS — K5900 Constipation, unspecified: Secondary | ICD-10-CM | POA: Insufficient documentation

## 2018-05-27 DIAGNOSIS — I10 Essential (primary) hypertension: Secondary | ICD-10-CM | POA: Diagnosis not present

## 2018-05-27 DIAGNOSIS — R5382 Chronic fatigue, unspecified: Secondary | ICD-10-CM

## 2018-05-27 LAB — COMPREHENSIVE METABOLIC PANEL
ALBUMIN: 3.8 g/dL (ref 3.5–5.0)
ALT: 18 U/L (ref 0–44)
ANION GAP: 6 (ref 5–15)
AST: 16 U/L (ref 15–41)
Alkaline Phosphatase: 84 U/L (ref 38–126)
BILIRUBIN TOTAL: 0.4 mg/dL (ref 0.3–1.2)
BUN: 8 mg/dL (ref 6–20)
CO2: 28 mmol/L (ref 22–32)
Calcium: 9.2 mg/dL (ref 8.9–10.3)
Chloride: 106 mmol/L (ref 98–111)
Creatinine, Ser: 0.66 mg/dL (ref 0.61–1.24)
GFR calc Af Amer: >60 mL/min (ref >60)
GFR calc non Af Amer: >60 mL/min (ref >60)
GLUCOSE: 95 mg/dL (ref 70–99)
POTASSIUM: 4.1 mmol/L (ref 3.5–5.1)
SODIUM: 140 mmol/L (ref 135–145)
TOTAL PROTEIN: 6.4 g/dL — AB (ref 6.5–8.1)

## 2018-05-27 LAB — CBC WITH DIFFERENTIAL/PLATELET
BASOS PCT: 0 %
Basophils Absolute: 0 10*3/uL (ref 0.0–0.1)
EOS ABS: 0 10*3/uL (ref 0.0–0.5)
Eosinophils Relative: 1 %
HCT: 44.2 % (ref 38.4–49.9)
Hemoglobin: 15 g/dL (ref 13.0–17.1)
Lymphocytes Relative: 7 %
Lymphs Abs: 0.5 10*3/uL — ABNORMAL LOW (ref 0.9–3.3)
MCH: 32.7 pg (ref 27.2–33.4)
MCHC: 33.9 g/dL (ref 32.0–36.0)
MCV: 96.3 fL (ref 79.3–98.0)
MONOS PCT: 10 %
Monocytes Absolute: 0.7 10*3/uL (ref 0.1–0.9)
Neutro Abs: 5.2 10*3/uL (ref 1.5–6.5)
Neutrophils Relative %: 82 %
Platelets: 221 10*3/uL (ref 140–400)
RBC: 4.59 MIL/uL (ref 4.20–5.82)
RDW: 13.3 % (ref 11.0–14.6)
WBC: 6.4 10*3/uL (ref 4.0–10.3)

## 2018-05-27 LAB — TSH: TSH: 1.49 u[IU]/mL (ref 0.320–4.118)

## 2018-05-27 MED ORDER — SODIUM CHLORIDE 0.9 % IV SOLN
200.0000 mg | Freq: Once | INTRAVENOUS | Status: AC
  Administered 2018-05-27: 200 mg via INTRAVENOUS
  Filled 2018-05-27: qty 8

## 2018-05-27 MED ORDER — HEPARIN SOD (PORK) LOCK FLUSH 100 UNIT/ML IV SOLN
500.0000 [IU] | Freq: Once | INTRAVENOUS | Status: AC | PRN
  Administered 2018-05-27: 500 [IU]
  Filled 2018-05-27: qty 5

## 2018-05-27 MED ORDER — SODIUM CHLORIDE 0.9 % IV SOLN
Freq: Once | INTRAVENOUS | Status: AC
  Administered 2018-05-27: 12:00:00 via INTRAVENOUS

## 2018-05-27 MED ORDER — SODIUM CHLORIDE 0.9% FLUSH
10.0000 mL | INTRAVENOUS | Status: DC | PRN
  Administered 2018-05-27: 10 mL
  Filled 2018-05-27: qty 10

## 2018-05-27 MED ORDER — SODIUM CHLORIDE 0.9% FLUSH
10.0000 mL | Freq: Once | INTRAVENOUS | Status: AC
  Administered 2018-05-27: 10 mL
  Filled 2018-05-27: qty 10

## 2018-05-27 NOTE — Telephone Encounter (Signed)
Called patient to ask about coming in to see Ashlyn @ 11 am today, patient agreed to do so.

## 2018-05-27 NOTE — Progress Notes (Addendum)
[place] Miners 52 y.o. 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       Weight changes, if any: Wt Readings from Last 3 Encounters:  05/27/18 157 lb 3.2 oz (71.3 kg)  05/27/18 157 lb 9.6 oz (71.5 kg)  05/06/18 154 lb 14.4 oz (70.3 kg)    Last ENT visit was on: Dr. Redmond Baseman about 4 weeks ago and had a scope.  Called for the l;ast office visit from Dr. Redmond Baseman office will see again 06-07-18.   Respiratory complaints, if any: SOB with exertion, coughing clear secretion no wheezing.   Swallowing Problems/Pain/Difficulty swallowing: Has to take while eating and swallowing does not choke while eating. BP 126/79 (BP Location: Right Arm, Patient Position: Sitting, Cuff Size: Normal)   Pulse 76   Temp 98.1 F (36.7 C) (Oral)   Resp 20   Ht 5\' 11"  (1.803 m)   Wt 157 lb 3.2 oz (71.3 kg)   SpO2 98%   BMI 21.92 kg/m

## 2018-05-27 NOTE — Progress Notes (Signed)
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  Interval Since Last Radiation:  9 months  Curative concurrent chemoradiation 06/30/2017 to 08/18/2017:    1. The lung and larynx were treated to 44 Gy in 22 fractions of 2 Gy. 2. The targets were boosted to 22 Gy in 11 fractions of 2 Gy.   Narrative:  The patient returns today for routine follow-up.  He has continued in follow up with Dr. Redmond Baseman regularly and had a nasopharyngeal laryngoscopy in Dr. Redmond Baseman' office 3 weeks ago which was reportedly normal with the exception of significant residual cord edema.  No evidence of residual or recurrent tumor. He had recent follow up systemic imaging with CT neck and chest on 05/25/18 which shows no evidence of residual or recurrent mass in the epiglottic region and no adenopathy or recurrence in the left supraclavicular region as well as no evidence of new or progressive metastatic disease in the chest.  He has continued on mmunotherapy with Keytruda under the care and is direction of Dr. Julien Nordmann, first dose was given 09/17/2017. He reports that he tolerates this very well.  On review of systems, the patient states that he is doing well overall. He denies dysphagia and is able to eat and drink without difficulty at this point. PEG tube has been removed. He continues to have hoarseness of his voice which is being monitored closely with Dr. Redmond Baseman. He denies any residual skin irritation in the neck or chest region. He denies chest pain, productive cough, hemoptysis, increased  shortness of breath, fever, chills or night sweats. He reports a healthy appetite and is maintaining his weight. In fact he has actually gained approximately 7 pounds since completing treatment. He is very pleased with his progress.  He denies abdominal pain, nausea, vomiting or diarrhea but reports occasional constipation.  Recent TSH was WNL at 1.661 on 05/06/18 and 1.449 on 05/27/18.    ALLERGIES:  is allergic to lexapro [escitalopram oxalate].  Meds: Current Outpatient Medications  Medication Sig Dispense Refill  . acyclovir (ZOVIRAX) 400 MG tablet Take 1 tablet (400 mg total) by mouth 3 (three) times daily. 21 tablet 3  . clonazePAM (KLONOPIN) 0.5 MG tablet Take 1-2 tablets (0.5-1 mg total) by mouth 2 (two) times daily as needed for anxiety. 180 tablet 2  . docusate sodium (COLACE) 100 MG capsule Take 100 mg daily by mouth.    . esomeprazole (NEXIUM) 40 MG capsule 1 po qam 90 capsule 3  . fluticasone furoate-vilanterol (BREO ELLIPTA) 100-25 MCG/INH AEPB INHALE 1 PUFF INTO THE LUNGS DAILY. 1 each 11  . loratadine (CLARITIN) 10 MG tablet Take 10 mg by mouth daily as needed for allergies.    Marland Kitchen ondansetron (ZOFRAN ODT) 4 MG disintegrating tablet Take 1 tablet (4 mg total) by mouth every 8 (eight) hours as needed for nausea or vomiting. (Patient not taking: Reported on 05/27/2018) 20 tablet 0   No current facility-administered medications for this encounter.     Physical Findings:  height is 5\' 11"  (1.803 m) and weight is  157 lb 3.2 oz (71.3 kg). His oral temperature is 98.1 F (36.7 C). His blood pressure is 126/79 and his pulse is 76. His respiration is 20 and oxygen saturation is 98%.   /10 In general this is a well appearing caucasian male in no acute distress. He's alert and oriented x4 and appropriate throughout the examination. Cardiopulmonary assessment is negative for acute distress and he exhibits normal effort. No cervical or axillary lymphadenopathy appreciated on exam.  Lab  Findings: Lab Results  Component Value Date   WBC 6.4 05/27/2018   HGB 15.0 05/27/2018   HCT 44.2 05/27/2018   MCV 96.3 05/27/2018   PLT 221 05/27/2018     Radiographic Findings: Ct Soft Tissue Neck W Contrast  Result Date: 05/25/2018 CLINICAL DATA:  Throat cancer being treated with chemotherapy, radiation and immunotherapy. Previous epiglottis lesion. EXAM: CT NECK WITH CONTRAST TECHNIQUE: Multidetector CT imaging of the neck was performed using the standard protocol following the bolus administration of intravenous contrast. CONTRAST:  28mL OMNIPAQUE IOHEXOL 300 MG/ML  SOLN COMPARISON:  03/22/2018 FINDINGS: Pharynx and larynx: No mucosal or submucosal lesion is seen. Continued appearance of post treatment edema of the epiglottis without evidence of residual or recurrent mass. No abnormality seen elsewhere. Salivary glands: Parotid and submandibular glands are normal. Thyroid: Normal Lymph nodes: No enlarged or low-density nodes on either side of the neck. No residual or recurrent left supraclavicular disease. Vascular: Carotid atherosclerosis.  No acute vascular finding. Limited intracranial: Negative Visualized orbits: Negative Mastoids and visualized paranasal sinuses: Clear Skeleton: Ordinary spondylosis. Upper chest: See results of chest CT. Other: None IMPRESSION: Post treatment appearance of the epiglottis region. No evidence of residual or recurrent mass. No adenopathy. No recurrence in the left supraclavicular region. Electronically Signed   By: Nelson Chimes M.D.   On: 05/25/2018 09:22   Ct Chest W Contrast  Result Date: 05/25/2018 CLINICAL DATA:  Squamous cell left lung carcinoma (at least stage IIIB) diagnosed August 2018 status post chemoradiation therapy (completed 08/18/2017) with ongoing immunotherapy. Additional history of squamous cell carcinoma of the epiglottis. Restaging. EXAM: CT CHEST WITH CONTRAST TECHNIQUE: Multidetector CT imaging of the chest was performed during  intravenous contrast administration. CONTRAST:  90mL OMNIPAQUE IOHEXOL 300 MG/ML  SOLN COMPARISON:  03/22/2018 chest CT. FINDINGS: Cardiovascular: Normal heart size. No significant pericardial effusion/thickening. Right internal jugular MediPort terminates at the cavoatrial junction. Atherosclerotic nonaneurysmal thoracic aorta. Normal caliber pulmonary arteries. No central pulmonary emboli. Mediastinum/Nodes: No discrete thyroid nodules. Unremarkable esophagus. No axillary adenopathy. Stable poorly marginated soft tissue in the left hilar and AP window regions measuring up to 1.1 cm in the left hilum (series 4/image 31). Otherwise no pathologically enlarged mediastinal or hilar nodes. Lungs/Pleura: No pneumothorax. No pleural effusion. Mild centrilobular and paraseptal emphysema with diffuse bronchial wall thickening. Sharply marginated patchy consolidation and ground-glass attenuation in parahilar/paramediastinal upper lungs, left greater than right, compatible with continued evolution of postradiation change. Previously described 1.8 x 0.9 cm anterior right upper lobe nodular opacity is decreased in density and is compatible with evolving postradiation change (series 7/image 50). No acute consolidative airspace disease or new significant pulmonary nodules. Upper abdomen: Hypodense 1.4 cm posterior upper right renal lesion (series 4/image 74), stable. Musculoskeletal: No aggressive appearing focal osseous lesions. Moderate thoracic spondylosis. IMPRESSION: 1. No evidence of new or progressive metastatic disease in the chest. 2. Stable poorly marginated left hilar/AP window soft tissue, compatible with treated tumor. Continued evolution of postradiation change in the parahilar/paramediastinal upper lungs, left greater than  right. 3. Stable indeterminate low-attenuation small upper right renal cortical lesion. Aortic Atherosclerosis (ICD10-I70.0) and Emphysema (ICD10-J43.9). Electronically Signed   By: Ilona Sorrel  M.D.   On: 05/25/2018 11:02    Impression/Plan: 1. 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    He has recovered well from the effects of radiotherapy. Recent CT Chest shows stability and continued improvement in his disease. He remains on immunotherapy with Keytruda under the care and direction of Dr. Julien Nordmann and has a scheduled follow-up with Dr. Julien Nordmann on 06/17/18.  He will continue with routine disease surveillance with serial CT imaging of the chest under the direction of Dr. Julien Nordmann. He is comfortable with this plan. He knows to call with any questions or concerns related to his prior radiotherapy. 2. 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 Office nasopharyngoscopy demonstrates no active disease or concerning findings.  His recent CT soft-tissue neck on 05/25/18 shows no evidence of residual or recurrent mass in the epiglottic region and no adenopathy or recurrence in the left supraclavicular region as well as no evidence of new or progressive metastatic disease in the chest.  Based on current NCCN guidelines, he should be seen in the office every 3 months for fibreroptic nasopharyngoscopy for the first year, then every 6 months for years 2-5 and annually thereafter. We plan to alternate the 3 month visits for fiberoptic nasopharyngoscopy with Dr. Redmond Baseman.  Recent TSH was WNL at 1.449 on 05/27/18.  We will continue to follow his TSH annually, as he is at an increased risk for hypothyroidism status-post neck irradiation.      Nicholos Johns, PA-C    Tyler Pita, MD  [facility] Direct Dial: [phone]  Fax: [phone] [email or web address]  Skype  LinkedIn

## 2018-05-27 NOTE — Telephone Encounter (Signed)
Scheduled appt per 7/18 los - patient to get an updated schedule next visit.

## 2018-05-27 NOTE — Patient Instructions (Signed)
[facility] Discharge Instructions for Patients Receiving Chemotherapy  Today you received the following chemotherapy agents:  Keytruda.  To help prevent nausea and vomiting after your treatment, we encourage you to take your nausea medication as directed.   If you develop nausea and vomiting that is not controlled by your nausea medication, call the clinic.   BELOW ARE SYMPTOMS THAT SHOULD BE REPORTED IMMEDIATELY:  *FEVER GREATER THAN 100.5 F  *CHILLS WITH OR WITHOUT FEVER  NAUSEA AND VOMITING THAT IS NOT CONTROLLED WITH YOUR NAUSEA MEDICATION  *UNUSUAL SHORTNESS OF BREATH  *UNUSUAL BRUISING OR BLEEDING  TENDERNESS IN MOUTH AND THROAT WITH OR WITHOUT PRESENCE OF ULCERS  *URINARY PROBLEMS  *BOWEL PROBLEMS  UNUSUAL RASH Items with * indicate a potential emergency and should be followed up as soon as possible.  Feel free to call the clinic should you have any questions or concerns. The clinic phone number is [phone].  Please show the CHEMO ALERT CARD at check-in to the Emergency Department and triage nurse.    

## 2018-05-27 NOTE — Progress Notes (Signed)
[place] [phone]   [phone]  OFFICE PROGRESS NOTE  [name], [name], MD [place] [address]  DIAGNOSIS:  1) Stage IIB/IV (T3, N3, M1a) non-small cell lung cancer, squamous cell carcinoma presented with large left hilar mass in addition to mediastinal and left supraclavicular lymphadenopathy as well as contralateral right upper lobe nodule diagnosed in August 2018. PDL 1 expression: 90%. 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 First dose 06/29/2017. Status post 6 cycles. Last dose was given 08/03/2017.  CURRENT THERAPY: Immunotherapy with Ketruda 200 MG IV every 3 weeks, first dose 09/17/2017.  Status post 12 cycles.  INTERVAL HISTORY: Trevor Zavala 52 y.o. male returns to the clinic today for follow-up visit accompanied by his wife.  The patient continues to tolerate his treatment with immunotherapy with [facility] fairly well.  He has mild skin rash on the face.  He also has persistent hoarseness of his voice.  He denied having any current chest pain, shortness of breath, cough or hemoptysis.  He denied having any fever or chills.  He has no nausea, vomiting, diarrhea or constipation.  The patient denied having any weight loss or night sweats.  He had repeat CT scan of the chest performed recently and he is here for evaluation and discussion of his discuss results.  MEDICAL HISTORY: Past Medical History:  Diagnosis Date  . Anxiety   . COPD (chronic obstructive pulmonary disease) ([place])   . GERD (gastroesophageal reflux disease)   . Headache   . Hypertension   . Pneumonia   . Situational depression   . Stage III squamous cell carcinoma of left lung ([place]) 06/19/2017    ALLERGIES:  is allergic to lexapro [escitalopram oxalate].  MEDICATIONS:  Current Outpatient Medications  Medication  Sig Dispense Refill  . acyclovir (ZOVIRAX) 400 MG tablet Take 1 tablet (400 mg total) by mouth 3 (three) times daily. (Patient not taking: Reported on 05/06/2018) 21 tablet 3  . clonazePAM (KLONOPIN) 0.5 MG tablet Take 1-2 tablets (0.5-1 mg total) by mouth 2 (two) times daily as needed for anxiety. 180 tablet 2  . docusate sodium (COLACE) 100 MG capsule Take 100 mg daily by mouth.    . esomeprazole (NEXIUM) 40 MG capsule 1 po qam 90 capsule 3  . fluticasone furoate-vilanterol (BREO ELLIPTA) 100-25 MCG/INH AEPB INHALE 1 PUFF INTO THE LUNGS DAILY. 1 each 11  . loratadine (CLARITIN) 10 MG tablet Take 10 mg by mouth daily as needed for allergies.    Marland Kitchen ondansetron (ZOFRAN ODT) 4 MG disintegrating tablet Take 1 tablet (4 mg total) by mouth every 8 (eight) hours as needed for nausea or vomiting. (Patient not taking: Reported on 05/06/2018) 20 tablet 0  . oxyCODONE-acetaminophen (PERCOCET/ROXICET) 5-325 MG tablet Take 1 tablet by mouth every 8 (eight) hours as needed for severe pain. (Patient not taking: Reported on 05/06/2018) 30 tablet 0   No current facility-administered medications for this visit.     SURGICAL HISTORY:  Past Surgical History:  Procedure Laterality Date  . COLONOSCOPY    . DIRECT LARYNGOSCOPY N/A 06/25/2017   Procedure: DIRECT LARYNGOSCOPY AND BIOPSY;  Surgeon: Rozetta Nunnery, MD;  Location: [place];  Service: ENT;  Laterality: N/A;  . EAR CYST EXCISION N/A 11/17/2013   Procedure: SEBACEOUS CYST CHEST;  Surgeon: Joyice Faster. Cornett, MD;  Location: [place];  Service: General;  Laterality: N/A;  . IR GASTROSTOMY TUBE MOD SED  08/10/2017  . IR GASTROSTOMY TUBE REMOVAL  09/29/2017  . KNEE ARTHROSCOPY     LEFT  . LIPOMA EXCISION N/A 11/17/2013   Procedure: EXCISION LIPOMA FOREHEAD;  Surgeon: Joyice Faster. Cornett, MD;  Location: [place];  Service: General;  Laterality: N/A;  . LUNG BIOPSY Bilateral 06/12/2017   Procedure: LEFT LUNG  BIOPSY;  Surgeon: Grace Isaac, MD;  Location: [place];  Service: Thoracic;  Laterality: Bilateral;  . PORTACATH PLACEMENT Right 07/01/2017   Procedure: INSERTION PORT-A-CATH - RIGHT IJ - placed with Fluoro and Ultrasound;  Surgeon: Grace Isaac, MD;  Location: [place];  Service: Thoracic;  Laterality: Right;  Marland Kitchen VIDEO BRONCHOSCOPY WITH ENDOBRONCHIAL ULTRASOUND N/A 06/12/2017   Procedure: VIDEO BRONCHOSCOPY WITH ENDOBRONCHIAL ULTRASOUND;  Surgeon: Grace Isaac, MD;  Location: [place];  Service: Thoracic;  Laterality: N/A;    REVIEW OF SYSTEMS:  Constitutional: negative Eyes: negative Ears, nose, mouth, throat, and face: positive for hoarseness Respiratory: negative Cardiovascular: negative Gastrointestinal: negative Genitourinary:negative Integument/breast: positive for rash Hematologic/lymphatic: negative Musculoskeletal:negative Neurological: negative Behavioral/Psych: negative Endocrine: negative Allergic/Immunologic: negative   PHYSICAL EXAMINATION: General appearance: alert, cooperative and no distress Head: Normocephalic, without obvious abnormality, atraumatic Neck: no adenopathy, no JVD, supple, symmetrical, trachea midline and thyroid not enlarged, symmetric, no tenderness/mass/nodules Lymph nodes: Cervical, supraclavicular, and axillary nodes normal. Resp: clear to auscultation bilaterally Back: symmetric, no curvature. ROM normal. No CVA tenderness. Cardio: regular rate and rhythm, S1, S2 normal, no murmur, click, rub or gallop GI: soft, non-tender; bowel sounds normal; no masses,  no organomegaly Extremities: extremities normal, atraumatic, no cyanosis or edema Neurologic: Alert and oriented X 3, normal strength and tone. Normal symmetric reflexes. Normal coordination and gait  ECOG PERFORMANCE STATUS: 1 - Symptomatic but completely ambulatory  Blood pressure 127/84, pulse 80, temperature 98.9 F (37.2 C), temperature source Oral, resp. rate 18, height 5\' 11"   (1.803 m), weight 157 lb 9.6 oz (71.5 kg), SpO2 98 %.  LABORATORY DATA: Lab Results  Component Value Date   WBC 6.4 05/27/2018   HGB 15.0 05/27/2018   HCT 44.2 05/27/2018   MCV 96.3 05/27/2018   PLT 221 05/27/2018      Chemistry      Component Value Date/Time   NA 140 05/27/2018 0912   NA 141 10/29/2017 1049   K 4.1 05/27/2018 0912   K 3.3 (L) 10/29/2017 1049   CL 106 05/27/2018 0912   CO2 28 05/27/2018 0912   CO2 26 10/29/2017 1049   BUN 8 05/27/2018 0912   BUN 11.2 10/29/2017 1049   CREATININE 0.66 05/27/2018 0912   CREATININE 0.7 10/29/2017 1049   GLU 198 08/25/2017      Component Value Date/Time   CALCIUM 9.2 05/27/2018 0912   CALCIUM 9.0 10/29/2017 1049   ALKPHOS 84 05/27/2018 0912   ALKPHOS 82 10/29/2017 1049   AST 16 05/27/2018 0912   AST 10 10/29/2017 1049   ALT 18 05/27/2018 0912   ALT 8 10/29/2017 1049   BILITOT 0.4 05/27/2018 0912   BILITOT 0.22 10/29/2017 1049       RADIOGRAPHIC STUDIES: Ct Soft Tissue Neck W Contrast  Result Date: 05/25/2018 CLINICAL DATA:  Throat cancer being treated with chemotherapy, radiation and immunotherapy. Previous epiglottis lesion. EXAM: CT NECK WITH CONTRAST TECHNIQUE: Multidetector CT imaging of the neck was performed using the standard protocol following the bolus administration of intravenous  contrast. CONTRAST:  27mL OMNIPAQUE IOHEXOL 300 MG/ML  SOLN COMPARISON:  03/22/2018 FINDINGS: Pharynx and larynx: No mucosal or submucosal lesion is seen. Continued appearance of post treatment edema of the epiglottis without evidence of residual or recurrent mass. No abnormality seen elsewhere. Salivary glands: Parotid and submandibular glands are normal. Thyroid: Normal Lymph nodes: No enlarged or low-density nodes on either side of the neck. No residual or recurrent left supraclavicular disease. Vascular: Carotid atherosclerosis.  No acute vascular finding. Limited intracranial: Negative Visualized orbits: Negative Mastoids and  visualized paranasal sinuses: Clear Skeleton: Ordinary spondylosis. Upper chest: See results of chest CT. Other: None IMPRESSION: Post treatment appearance of the epiglottis region. No evidence of residual or recurrent mass. No adenopathy. No recurrence in the left supraclavicular region. Electronically Signed   By: Nelson Chimes M.D.   On: 05/25/2018 09:22   Ct Chest W Contrast  Result Date: 05/25/2018 CLINICAL DATA:  Squamous cell left lung carcinoma (at least stage IIIB) diagnosed August 2018 status post chemoradiation therapy (completed 08/18/2017) with ongoing immunotherapy. Additional history of squamous cell carcinoma of the epiglottis. Restaging. EXAM: CT CHEST WITH CONTRAST TECHNIQUE: Multidetector CT imaging of the chest was performed during intravenous contrast administration. CONTRAST:  78mL OMNIPAQUE IOHEXOL 300 MG/ML  SOLN COMPARISON:  03/22/2018 chest CT. FINDINGS: Cardiovascular: Normal heart size. No significant pericardial effusion/thickening. Right internal jugular MediPort terminates at the cavoatrial junction. Atherosclerotic nonaneurysmal thoracic aorta. Normal caliber pulmonary arteries. No central pulmonary emboli. Mediastinum/Nodes: No discrete thyroid nodules. Unremarkable esophagus. No axillary adenopathy. Stable poorly marginated soft tissue in the left hilar and AP window regions measuring up to 1.1 cm in the left hilum (series 4/image 31). Otherwise no pathologically enlarged mediastinal or hilar nodes. Lungs/Pleura: No pneumothorax. No pleural effusion. Mild centrilobular and paraseptal emphysema with diffuse bronchial wall thickening. Sharply marginated patchy consolidation and ground-glass attenuation in parahilar/paramediastinal upper lungs, left greater than right, compatible with continued evolution of postradiation change. Previously described 1.8 x 0.9 cm anterior right upper lobe nodular opacity is decreased in density and is compatible with evolving postradiation change  (series 7/image 50). No acute consolidative airspace disease or new significant pulmonary nodules. Upper abdomen: Hypodense 1.4 cm posterior upper right renal lesion (series 4/image 74), stable. Musculoskeletal: No aggressive appearing focal osseous lesions. Moderate thoracic spondylosis. IMPRESSION: 1. No evidence of new or progressive metastatic disease in the chest. 2. Stable poorly marginated left hilar/AP window soft tissue, compatible with treated tumor. Continued evolution of postradiation change in the parahilar/paramediastinal upper lungs, left greater than right. 3. Stable indeterminate low-attenuation small upper right renal cortical lesion. Aortic Atherosclerosis (ICD10-I70.0) and Emphysema (ICD10-J43.9). Electronically Signed   By: Ilona Sorrel M.D.   On: 05/25/2018 11:02    ASSESSMENT AND PLAN: This is a very pleasant 52 years old white male with recently diagnosed stage IIIB/IV non-small cell lung cancer, adenocarcinoma presented with large left hilar mass in addition to mediastinal and left supraclavicular lymphadenopathy as well as suspicious right upper lobe pulmonary nodule diagnosed in August 2018. 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 He underwent a course of concurrent chemoradiation to the lung as well as the epiglottic area under the care of Dr. Tammi Klippel. He is status post 6 cycle. He tolerated this course of treatment well except for the radiation induced esophagitis as well as weight loss and fatigue. The patient had partial response to the previous treatment. He is currently on treatment with Keytruda 200 mg IV every 3 weeks status  post 12 cycles. He continues to tolerate the treatment well with no concerning complaints. He had repeat CT scan of the neck and chest performed recently.  I personally and independently reviewed the scans and discussed the results with the patient and his wife today.  His a scan showed no concerning  findings for disease progression.  I recommended for the patient to proceed with cycle #13 of his treatment with Keytruda today. I will see him back for follow-up visit in 3 weeks for evaluation before the next cycle of his treatment. For the skin rash, he will continue to apply hydrocortisone cream and use Benadryl on as-needed basis. For the hoarseness of his voice he is followed by Dr. Redmond Baseman from ENT. He was advised to call immediately if he has any concerning symptoms in the interval. The patient voices understanding of current disease status and treatment options and is in agreement with the current care plan. All questions were answered. The patient knows to call the clinic with any problems, questions or concerns. We can certainly see the patient much sooner if necessary. Disclaimer: This note was dictated with voice recognition software. Similar sounding words can inadvertently be transcribed and may not be corrected upon review.

## 2018-05-31 ENCOUNTER — Telehealth: Payer: Self-pay | Admitting: *Deleted

## 2018-05-31 NOTE — Telephone Encounter (Signed)
CALLED PATIENT TO INFORM OF FU APPT. WITH ASHLYN [place] ON 12-01-18 @ 10:30 AM , LVM FOR A RETURN CALL

## 2018-06-03 ENCOUNTER — Telehealth: Payer: Self-pay | Admitting: Internal Medicine

## 2018-06-03 NOTE — Telephone Encounter (Signed)
Tried to reach regarding 8/29 I will mail letter

## 2018-06-11 ENCOUNTER — Other Ambulatory Visit: Payer: Self-pay | Admitting: Otolaryngology

## 2018-06-17 ENCOUNTER — Telehealth: Payer: Self-pay | Admitting: Internal Medicine

## 2018-06-17 ENCOUNTER — Encounter: Payer: Self-pay | Admitting: Internal Medicine

## 2018-06-17 ENCOUNTER — Inpatient Hospital Stay (HOSPITAL_BASED_OUTPATIENT_CLINIC_OR_DEPARTMENT_OTHER): Payer: Managed Care, Other (non HMO) | Admitting: Internal Medicine

## 2018-06-17 ENCOUNTER — Inpatient Hospital Stay: Payer: Managed Care, Other (non HMO)

## 2018-06-17 ENCOUNTER — Inpatient Hospital Stay: Payer: Managed Care, Other (non HMO) | Attending: Internal Medicine

## 2018-06-17 VITALS — BP 119/84 | HR 77 | Temp 98.4°F | Resp 18 | Ht 71.0 in | Wt 159.5 lb

## 2018-06-17 DIAGNOSIS — C3412 Malignant neoplasm of upper lobe, left bronchus or lung: Secondary | ICD-10-CM | POA: Insufficient documentation

## 2018-06-17 DIAGNOSIS — R5382 Chronic fatigue, unspecified: Secondary | ICD-10-CM

## 2018-06-17 DIAGNOSIS — C321 Malignant neoplasm of supraglottis: Secondary | ICD-10-CM | POA: Insufficient documentation

## 2018-06-17 DIAGNOSIS — Z9221 Personal history of antineoplastic chemotherapy: Secondary | ICD-10-CM

## 2018-06-17 DIAGNOSIS — K208 Other esophagitis: Secondary | ICD-10-CM | POA: Diagnosis not present

## 2018-06-17 DIAGNOSIS — Z95828 Presence of other vascular implants and grafts: Secondary | ICD-10-CM

## 2018-06-17 DIAGNOSIS — I10 Essential (primary) hypertension: Secondary | ICD-10-CM | POA: Insufficient documentation

## 2018-06-17 DIAGNOSIS — Z5112 Encounter for antineoplastic immunotherapy: Secondary | ICD-10-CM | POA: Diagnosis not present

## 2018-06-17 DIAGNOSIS — Z923 Personal history of irradiation: Secondary | ICD-10-CM

## 2018-06-17 LAB — COMPREHENSIVE METABOLIC PANEL
ALK PHOS: 76 U/L (ref 38–126)
ALT: 14 U/L (ref 0–44)
AST: 15 U/L (ref 15–41)
Albumin: 3.7 g/dL (ref 3.5–5.0)
Anion gap: 10 (ref 5–15)
BUN: 8 mg/dL (ref 6–20)
CALCIUM: 9 mg/dL (ref 8.9–10.3)
CO2: 25 mmol/L (ref 22–32)
Chloride: 106 mmol/L (ref 98–111)
Creatinine, Ser: 0.72 mg/dL (ref 0.61–1.24)
GFR calc non Af Amer: >60 mL/min (ref >60)
GLUCOSE: 124 mg/dL — AB (ref 70–99)
Potassium: 4.3 mmol/L (ref 3.5–5.1)
SODIUM: 141 mmol/L (ref 135–145)
Total Bilirubin: 0.6 mg/dL (ref 0.3–1.2)
Total Protein: 6.3 g/dL — ABNORMAL LOW (ref 6.5–8.1)

## 2018-06-17 LAB — CBC WITH DIFFERENTIAL/PLATELET
BASOS PCT: 0 %
Basophils Absolute: 0 10*3/uL (ref 0.0–0.1)
EOS PCT: 1 %
Eosinophils Absolute: 0.1 10*3/uL (ref 0.0–0.5)
HCT: 44.3 % (ref 38.4–49.9)
Hemoglobin: 15.3 g/dL (ref 13.0–17.1)
Lymphocytes Relative: 9 %
Lymphs Abs: 0.4 10*3/uL — ABNORMAL LOW (ref 0.9–3.3)
MCH: 33.6 pg — AB (ref 27.2–33.4)
MCHC: 34.5 g/dL (ref 32.0–36.0)
MCV: 97.1 fL (ref 79.3–98.0)
MONO ABS: 0.5 10*3/uL (ref 0.1–0.9)
MONOS PCT: 11 %
NEUTROS PCT: 79 %
Neutro Abs: 3.9 10*3/uL (ref 1.5–6.5)
PLATELETS: 202 10*3/uL (ref 140–400)
RBC: 4.56 MIL/uL (ref 4.20–5.82)
RDW: 13.4 % (ref 11.0–14.6)
WBC: 4.9 10*3/uL (ref 4.0–10.3)

## 2018-06-17 LAB — TSH: TSH: 1.765 u[IU]/mL (ref 0.320–4.118)

## 2018-06-17 MED ORDER — HEPARIN SOD (PORK) LOCK FLUSH 100 UNIT/ML IV SOLN
500.0000 [IU] | Freq: Once | INTRAVENOUS | Status: AC | PRN
  Administered 2018-06-17: 500 [IU]
  Filled 2018-06-17: qty 5

## 2018-06-17 MED ORDER — SODIUM CHLORIDE 0.9 % IV SOLN
200.0000 mg | Freq: Once | INTRAVENOUS | Status: AC
  Administered 2018-06-17: 200 mg via INTRAVENOUS
  Filled 2018-06-17: qty 8

## 2018-06-17 MED ORDER — SODIUM CHLORIDE 0.9 % IV SOLN
Freq: Once | INTRAVENOUS | Status: AC
  Administered 2018-06-17: 10:00:00 via INTRAVENOUS
  Filled 2018-06-17: qty 250

## 2018-06-17 MED ORDER — SODIUM CHLORIDE 0.9% FLUSH
10.0000 mL | Freq: Once | INTRAVENOUS | Status: AC
  Administered 2018-06-17: 10 mL
  Filled 2018-06-17: qty 10

## 2018-06-17 MED ORDER — SODIUM CHLORIDE 0.9% FLUSH
10.0000 mL | INTRAVENOUS | Status: DC | PRN
Start: 2018-06-17 — End: 2018-06-17
  Administered 2018-06-17: 10 mL
  Filled 2018-06-17: qty 10

## 2018-06-17 NOTE — Telephone Encounter (Signed)
3 cycles already scheduled per 8/8 los - no additional appts added.

## 2018-06-17 NOTE — Patient Instructions (Signed)
[facility] Discharge Instructions for Patients Receiving Chemotherapy  Today you received the following chemotherapy agents:  Keytruda.  To help prevent nausea and vomiting after your treatment, we encourage you to take your nausea medication as directed.   If you develop nausea and vomiting that is not controlled by your nausea medication, call the clinic.   BELOW ARE SYMPTOMS THAT SHOULD BE REPORTED IMMEDIATELY:  *FEVER GREATER THAN 100.5 F  *CHILLS WITH OR WITHOUT FEVER  NAUSEA AND VOMITING THAT IS NOT CONTROLLED WITH YOUR NAUSEA MEDICATION  *UNUSUAL SHORTNESS OF BREATH  *UNUSUAL BRUISING OR BLEEDING  TENDERNESS IN MOUTH AND THROAT WITH OR WITHOUT PRESENCE OF ULCERS  *URINARY PROBLEMS  *BOWEL PROBLEMS  UNUSUAL RASH Items with * indicate a potential emergency and should be followed up as soon as possible.  Feel free to call the clinic should you have any questions or concerns. The clinic phone number is [phone].  Please show the CHEMO ALERT CARD at check-in to the Emergency Department and triage nurse.    

## 2018-06-17 NOTE — Progress Notes (Signed)
[facility] [phone]   [phone]  OFFICE PROGRESS NOTE  [name], [name], MD [place] [address]  DIAGNOSIS:  1) Stage IIB/IV (T3, N3, M1a) non-small cell lung cancer, squamous cell carcinoma presented with large left hilar mass in addition to mediastinal and left supraclavicular lymphadenopathy as well as contralateral right upper lobe nodule diagnosed in August 2018. PDL 1 expression: 90%. 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 First dose 06/29/2017. Status post 6 cycles. Last dose was given 08/03/2017.  CURRENT THERAPY: Immunotherapy with Ketruda 200 MG IV every 3 weeks, first dose 09/17/2017.  Status post 13 cycles.  INTERVAL HISTORY: Trevor Zavala 52 y.o. male returns to the clinic today for follow-up visit.  The patient is feeling fine today with no concerning complaints.  He continues to tolerate his treatment with Keytruda fairly well.  He was seen recently by Dr. Redmond Baseman and expected to have injection to help his vocal cords and improve the hoarseness of his voice.  He denied having any chest pain, shortness breath, cough or hemoptysis.  He denied having any fever or chills.  He has no nausea, vomiting, diarrhea or constipation.  He has one episode of redness of the face 2 weeks ago and that improved with Benadryl.  He is here today for evaluation before starting cycle #14.  MEDICAL HISTORY: Past Medical History:  Diagnosis Date  . Anxiety   . COPD (chronic obstructive pulmonary disease) ([facility])   . GERD (gastroesophageal reflux disease)   . Headache   . Hypertension   . Pneumonia   . Situational depression   . Stage III squamous cell carcinoma of left lung ([facility]) 06/19/2017    ALLERGIES:  is allergic to lexapro [escitalopram oxalate].  MEDICATIONS:  Current Outpatient Medications   Medication Sig Dispense Refill  . clonazePAM (KLONOPIN) 0.5 MG tablet Take 1-2 tablets (0.5-1 mg total) by mouth 2 (two) times daily as needed for anxiety. 180 tablet 2  . docusate sodium (COLACE) 100 MG capsule Take 100 mg daily by mouth.    . esomeprazole (NEXIUM) 40 MG capsule 1 po qam 90 capsule 3  . fluticasone furoate-vilanterol (BREO ELLIPTA) 100-25 MCG/INH AEPB INHALE 1 PUFF INTO THE LUNGS DAILY. 1 each 11  . loratadine (CLARITIN) 10 MG tablet Take 10 mg by mouth daily as needed for allergies.    Marland Kitchen acyclovir (ZOVIRAX) 400 MG tablet Take 1 tablet (400 mg total) by mouth 3 (three) times daily. (Patient not taking: Reported on 06/17/2018) 21 tablet 3  . ondansetron (ZOFRAN ODT) 4 MG disintegrating tablet Take 1 tablet (4 mg total) by mouth every 8 (eight) hours as needed for nausea or vomiting. (Patient not taking: Reported on 05/27/2018) 20 tablet 0   No current facility-administered medications for this visit.     SURGICAL HISTORY:  Past Surgical History:  Procedure Laterality Date  . COLONOSCOPY    . DIRECT LARYNGOSCOPY N/A 06/25/2017   Procedure: DIRECT LARYNGOSCOPY AND BIOPSY;  Surgeon: Rozetta Nunnery, MD;  Location: [facility];  Service: ENT;  Laterality: N/A;  . EAR CYST EXCISION N/A 11/17/2013   Procedure: SEBACEOUS CYST CHEST;  Surgeon: Joyice Faster. Cornett, MD;  Location: [facility];  Service: General;  Laterality: N/A;  . IR GASTROSTOMY TUBE MOD SED  08/10/2017  . IR GASTROSTOMY TUBE REMOVAL  09/29/2017  . KNEE ARTHROSCOPY     LEFT  . LIPOMA EXCISION N/A 11/17/2013   Procedure: EXCISION LIPOMA FOREHEAD;  Surgeon: Joyice Faster. Cornett, MD;  Location: [facility];  Service: General;  Laterality: N/A;  . LUNG BIOPSY Bilateral 06/12/2017   Procedure: LEFT LUNG BIOPSY;  Surgeon: Grace Isaac, MD;  Location: [facility];  Service: Thoracic;  Laterality: Bilateral;  . PORTACATH PLACEMENT Right 07/01/2017   Procedure: INSERTION PORT-A-CATH -  RIGHT IJ - placed with Fluoro and Ultrasound;  Surgeon: Grace Isaac, MD;  Location: [facility];  Service: Thoracic;  Laterality: Right;  Marland Kitchen VIDEO BRONCHOSCOPY WITH ENDOBRONCHIAL ULTRASOUND N/A 06/12/2017   Procedure: VIDEO BRONCHOSCOPY WITH ENDOBRONCHIAL ULTRASOUND;  Surgeon: Grace Isaac, MD;  Location: [facility];  Service: Thoracic;  Laterality: N/A;    REVIEW OF SYSTEMS:  A comprehensive review of systems was negative.   PHYSICAL EXAMINATION: General appearance: alert, cooperative and no distress Head: Normocephalic, without obvious abnormality, atraumatic Neck: no adenopathy, no JVD, supple, symmetrical, trachea midline and thyroid not enlarged, symmetric, no tenderness/mass/nodules Lymph nodes: Cervical, supraclavicular, and axillary nodes normal. Resp: clear to auscultation bilaterally Back: symmetric, no curvature. ROM normal. No CVA tenderness. Cardio: regular rate and rhythm, S1, S2 normal, no murmur, click, rub or gallop GI: soft, non-tender; bowel sounds normal; no masses,  no organomegaly Extremities: extremities normal, atraumatic, no cyanosis or edema  ECOG PERFORMANCE STATUS: 1 - Symptomatic but completely ambulatory  Blood pressure 119/84, pulse 77, temperature 98.4 F (36.9 C), temperature source Oral, resp. rate 18, height 5\' 11"  (1.803 m), weight 159 lb 8 oz (72.3 kg), SpO2 99 %.  LABORATORY DATA: Lab Results  Component Value Date   WBC 4.9 06/17/2018   HGB 15.3 06/17/2018   HCT 44.3 06/17/2018   MCV 97.1 06/17/2018   PLT 202 06/17/2018      Chemistry      Component Value Date/Time   NA 141 06/17/2018 0807   NA 141 10/29/2017 1049   K 4.3 06/17/2018 0807   K 3.3 (L) 10/29/2017 1049   CL 106 06/17/2018 0807   CO2 25 06/17/2018 0807   CO2 26 10/29/2017 1049   BUN 8 06/17/2018 0807   BUN 11.2 10/29/2017 1049   CREATININE 0.72 06/17/2018 0807   CREATININE 0.7 10/29/2017 1049   GLU 198 08/25/2017      Component Value Date/Time   CALCIUM 9.0 06/17/2018  0807   CALCIUM 9.0 10/29/2017 1049   ALKPHOS 76 06/17/2018 0807   ALKPHOS 82 10/29/2017 1049   AST 15 06/17/2018 0807   AST 10 10/29/2017 1049   ALT 14 06/17/2018 0807   ALT 8 10/29/2017 1049   BILITOT 0.6 06/17/2018 0807   BILITOT 0.22 10/29/2017 1049       RADIOGRAPHIC STUDIES: Ct Soft Tissue Neck W Contrast  Result Date: 05/25/2018 CLINICAL DATA:  Throat cancer being treated with chemotherapy, radiation and immunotherapy. Previous epiglottis lesion. EXAM: CT NECK WITH CONTRAST TECHNIQUE: Multidetector CT imaging of the neck was performed using the standard protocol following the bolus administration of intravenous contrast. CONTRAST:  57mL OMNIPAQUE IOHEXOL 300 MG/ML  SOLN COMPARISON:  03/22/2018 FINDINGS: Pharynx and larynx: No mucosal or submucosal lesion is seen. Continued appearance of post treatment edema of the epiglottis without evidence of residual or recurrent mass. No abnormality seen elsewhere. Salivary glands: Parotid and submandibular glands are normal. Thyroid: Normal Lymph nodes: No enlarged or low-density nodes on either side of the neck. No residual or recurrent left  supraclavicular disease. Vascular: Carotid atherosclerosis.  No acute vascular finding. Limited intracranial: Negative Visualized orbits: Negative Mastoids and visualized paranasal sinuses: Clear Skeleton: Ordinary spondylosis. Upper chest: See results of chest CT. Other: None IMPRESSION: Post treatment appearance of the epiglottis region. No evidence of residual or recurrent mass. No adenopathy. No recurrence in the left supraclavicular region. Electronically Signed   By: Nelson Chimes M.D.   On: 05/25/2018 09:22   Ct Chest W Contrast  Result Date: 05/25/2018 CLINICAL DATA:  Squamous cell left lung carcinoma (at least stage IIIB) diagnosed August 2018 status post chemoradiation therapy (completed 08/18/2017) with ongoing immunotherapy. Additional history of squamous cell carcinoma of the epiglottis. Restaging.  EXAM: CT CHEST WITH CONTRAST TECHNIQUE: Multidetector CT imaging of the chest was performed during intravenous contrast administration. CONTRAST:  23mL OMNIPAQUE IOHEXOL 300 MG/ML  SOLN COMPARISON:  03/22/2018 chest CT. FINDINGS: Cardiovascular: Normal heart size. No significant pericardial effusion/thickening. Right internal jugular MediPort terminates at the cavoatrial junction. Atherosclerotic nonaneurysmal thoracic aorta. Normal caliber pulmonary arteries. No central pulmonary emboli. Mediastinum/Nodes: No discrete thyroid nodules. Unremarkable esophagus. No axillary adenopathy. Stable poorly marginated soft tissue in the left hilar and AP window regions measuring up to 1.1 cm in the left hilum (series 4/image 31). Otherwise no pathologically enlarged mediastinal or hilar nodes. Lungs/Pleura: No pneumothorax. No pleural effusion. Mild centrilobular and paraseptal emphysema with diffuse bronchial wall thickening. Sharply marginated patchy consolidation and ground-glass attenuation in parahilar/paramediastinal upper lungs, left greater than right, compatible with continued evolution of postradiation change. Previously described 1.8 x 0.9 cm anterior right upper lobe nodular opacity is decreased in density and is compatible with evolving postradiation change (series 7/image 50). No acute consolidative airspace disease or new significant pulmonary nodules. Upper abdomen: Hypodense 1.4 cm posterior upper right renal lesion (series 4/image 74), stable. Musculoskeletal: No aggressive appearing focal osseous lesions. Moderate thoracic spondylosis. IMPRESSION: 1. No evidence of new or progressive metastatic disease in the chest. 2. Stable poorly marginated left hilar/AP window soft tissue, compatible with treated tumor. Continued evolution of postradiation change in the parahilar/paramediastinal upper lungs, left greater than right. 3. Stable indeterminate low-attenuation small upper right renal cortical lesion. Aortic  Atherosclerosis (ICD10-I70.0) and Emphysema (ICD10-J43.9). Electronically Signed   By: Ilona Sorrel M.D.   On: 05/25/2018 11:02    ASSESSMENT AND PLAN: This is a very pleasant 52 years old white male with recently diagnosed stage IIIB/IV non-small cell lung cancer, adenocarcinoma presented with large left hilar mass in addition to mediastinal and left supraclavicular lymphadenopathy as well as suspicious right upper lobe pulmonary nodule diagnosed in August 2018. 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 He underwent a course of concurrent chemoradiation to the lung as well as the epiglottic area under the care of Dr. Tammi Klippel. He is status post 6 cycle. He tolerated this course of treatment well except for the radiation induced esophagitis as well as weight loss and fatigue. The patient had partial response to the previous treatment. He is currently on treatment with Keytruda 200 mg IV every 3 weeks status post 13 cycles. The patient is tolerating this treatment well with no concerning complaints.  I recommended for him to proceed with cycle #14 today as scheduled. I will see him back for follow-up visit in 3 weeks for evaluation before starting cycle #15. He was advised to call immediately if he has any concerning symptoms in the interval. The patient voices understanding of current disease status and treatment options and is  in agreement with the current care plan. All questions were answered. The patient knows to call the clinic with any problems, questions or concerns. We can certainly see the patient much sooner if necessary. Disclaimer: This note was dictated with voice recognition software. Similar sounding words can inadvertently be transcribed and may not be corrected upon review.

## 2018-06-21 ENCOUNTER — Encounter (HOSPITAL_COMMUNITY): Payer: Self-pay | Admitting: *Deleted

## 2018-06-21 ENCOUNTER — Emergency Department (HOSPITAL_COMMUNITY)
Admission: EM | Admit: 2018-06-21 | Discharge: 2018-06-21 | Disposition: A | Discharge status: Home / Self Care | Payer: Managed Care, Other (non HMO) | Attending: Emergency Medicine | Admitting: Emergency Medicine

## 2018-06-21 ENCOUNTER — Other Ambulatory Visit: Payer: Self-pay

## 2018-06-21 DIAGNOSIS — J449 Chronic obstructive pulmonary disease, unspecified: Secondary | ICD-10-CM | POA: Insufficient documentation

## 2018-06-21 DIAGNOSIS — X118XXA Contact with other hot tap-water, initial encounter: Secondary | ICD-10-CM | POA: Diagnosis not present

## 2018-06-21 DIAGNOSIS — Y93G1 Activity, food preparation and clean up: Secondary | ICD-10-CM | POA: Diagnosis not present

## 2018-06-21 DIAGNOSIS — Z79899 Other long term (current) drug therapy: Secondary | ICD-10-CM | POA: Diagnosis not present

## 2018-06-21 DIAGNOSIS — I10 Essential (primary) hypertension: Secondary | ICD-10-CM | POA: Insufficient documentation

## 2018-06-21 DIAGNOSIS — Y929 Unspecified place or not applicable: Secondary | ICD-10-CM | POA: Diagnosis not present

## 2018-06-21 DIAGNOSIS — Y999 Unspecified external cause status: Secondary | ICD-10-CM | POA: Insufficient documentation

## 2018-06-21 DIAGNOSIS — T2016XA Burn of first degree of forehead and cheek, initial encounter: Secondary | ICD-10-CM | POA: Insufficient documentation

## 2018-06-21 DIAGNOSIS — T2006XA Burn of unspecified degree of forehead and cheek, initial encounter: Secondary | ICD-10-CM | POA: Diagnosis present

## 2018-06-21 DIAGNOSIS — T300 Burn of unspecified body region, unspecified degree: Secondary | ICD-10-CM

## 2018-06-21 DIAGNOSIS — Z87891 Personal history of nicotine dependence: Secondary | ICD-10-CM | POA: Diagnosis not present
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
## 2018-06-23 ENCOUNTER — Telehealth: Payer: Self-pay | Admitting: Internal Medicine

## 2018-06-23 NOTE — Telephone Encounter (Signed)
[facility] - moved 8/29 f/u from [facility] to [facility] and adjusted associated appointments. Left message for patient. Schedule mailed.

## 2018-07-06 NOTE — Pre-Procedure Instructions (Signed)
KHALIL SZCZEPANIK  07/06/2018      [id number] - [place], [place] - [address] [place] [address] Phone: [phone] Fax: [phone]  [place] [address] [place] - [address] [place] [address] Phone: [phone] Fax: [phone]  [place], [place] [address] Phone: [phone] Fax: [phone]  [place], [place] - [address] [facility] [address] [place] [address] Phone: [phone] Fax: [place], [place] [address] Phone: [phone] Fax: [phone]    Your procedure is scheduled on July 16, 2018.  Report to [facility] Admitting at 530 AM.  Call this number if you have problems the morning of surgery:  [phone]   Remember:  Do not eat or drink after midnight.    Take these medicines the morning of surgery with A SIP OF WATER  Clonazepam (klonopin)-if needed Esomeprazole (nexium) Breo Ellipta inhaler Loratadine (claritin)-if needed  7 days prior to surgery STOP taking any Aspirin (unless otherwise instructed by your surgeon), Aleve, Naproxen, Ibuprofen, Motrin, Advil, Goody's, BC's, all herbal medications, fish oil, and all vitamins    Do not wear jewelry  Do not wear lotions, powders, or colognes, or deodorant.  Men may shave face and neck.  Do not bring valuables to the hospital.  [facility] is not responsible for any belongings or valuables.  Contacts, dentures or bridgework may not be worn into surgery.  Leave your suitcase in the car.  After surgery it may be brought to your room.  For patients admitted to the hospital, discharge time will be determined by your treatment team.  Patients discharged the day of surgery will not be allowed to  drive home.    [place]- Preparing For Surgery  Before surgery, you can play an important role. Because skin is not sterile, your skin needs to be as free of germs as possible. You can reduce the number of germs on your skin by washing with CHG (chlorahexidine gluconate) Soap before surgery.  CHG is an antiseptic cleaner which kills germs and bonds with the skin to continue killing germs even after washing.    Oral Hygiene is also important to reduce your risk of infection.  Remember - BRUSH YOUR TEETH THE MORNING OF SURGERY WITH YOUR REGULAR TOOTHPASTE  Please do not use if you have an allergy to CHG or antibacterial soaps. If your skin becomes reddened/irritated stop using the CHG.  Do not shave (including legs and underarms) for at least 48 hours prior to first CHG shower. It is OK to shave your face.  Please follow these instructions carefully.   1. Shower the NIGHT BEFORE SURGERY and the MORNING OF SURGERY with CHG.   2. If you chose to wash your hair, wash your hair first as usual with your normal shampoo.  3. After you shampoo, rinse your hair and body thoroughly to remove the shampoo.  4. Use CHG as you would any other liquid soap. You can apply CHG directly to the skin and wash gently with a scrungie or a clean washcloth.   5. Apply the CHG Soap to your body ONLY FROM THE NECK DOWN.  Do not use on open wounds or open sores. Avoid contact with  your eyes, ears, mouth and genitals (private parts). Wash Face and genitals (private parts)  with your normal soap.  6. Wash thoroughly, paying special attention to the area where your surgery will be performed.  7. Thoroughly rinse your body with warm water from the neck down.  8. DO NOT shower/wash with your normal soap after using and rinsing off the CHG Soap.  9. Pat yourself dry with a CLEAN TOWEL.  10. Wear CLEAN PAJAMAS to bed the night before surgery, wear comfortable clothes the morning of surgery  11. Place CLEAN SHEETS on  your bed the night of your first shower and DO NOT SLEEP WITH PETS.  Day of Surgery:  Do not apply any deodorants/lotions.  Please wear clean clothes to the hospital/surgery center.   Remember to brush your teeth WITH YOUR REGULAR TOOTHPASTE.   Please read over the following fact sheets that you were given.

## 2018-07-06 NOTE — Progress Notes (Addendum)
PCP: Lew Dawes, MD  Cardiologist: denies  EKG: 08/15/17 in [facility]  Stress test: pt denies  ECHO: pt denies  Cardiac Cath: pt denies  Chest x-ray: 08/07/17 in [facility]

## 2018-07-07 ENCOUNTER — Other Ambulatory Visit: Payer: Self-pay

## 2018-07-07 ENCOUNTER — Encounter (HOSPITAL_COMMUNITY)
Admission: RE | Admit: 2018-07-07 | Discharge: 2018-07-07 | Disposition: A | Discharge status: Home / Self Care | Payer: Managed Care, Other (non HMO) | Source: Ambulatory Visit | Attending: Otolaryngology | Admitting: Otolaryngology

## 2018-07-07 DIAGNOSIS — Z01812 Encounter for preprocedural laboratory examination: Secondary | ICD-10-CM | POA: Insufficient documentation

## 2018-07-07 HISTORY — DX: Benign neoplasm of colon, unspecified: D12.6

## 2018-07-07 LAB — CBC
HEMATOCRIT: 46.8 % (ref 39.0–52.0)
HEMOGLOBIN: 15.5 g/dL (ref 13.0–17.0)
MCH: 33.1 pg (ref 26.0–34.0)
MCHC: 33.1 g/dL (ref 30.0–36.0)
MCV: 100 fL (ref 78.0–100.0)
Platelets: 239 10*3/uL (ref 150–400)
RBC: 4.68 MIL/uL (ref 4.22–5.81)
RDW: 12.3 % (ref 11.5–15.5)
WBC: 6.8 10*3/uL (ref 4.0–10.5)

## 2018-07-07 LAB — BASIC METABOLIC PANEL
ANION GAP: 7 (ref 5–15)
BUN: 6 mg/dL (ref 6–20)
CALCIUM: 9.3 mg/dL (ref 8.9–10.3)
CO2: 28 mmol/L (ref 22–32)
Chloride: 107 mmol/L (ref 98–111)
Creatinine, Ser: 0.68 mg/dL (ref 0.61–1.24)
GFR calc Af Amer: >60 mL/min (ref >60)
GLUCOSE: 100 mg/dL — AB (ref 70–99)
Potassium: 3.9 mmol/L (ref 3.5–5.1)
Sodium: 142 mmol/L (ref 135–145)

## 2018-07-08 ENCOUNTER — Inpatient Hospital Stay (HOSPITAL_BASED_OUTPATIENT_CLINIC_OR_DEPARTMENT_OTHER): Payer: Managed Care, Other (non HMO) | Admitting: Oncology

## 2018-07-08 ENCOUNTER — Inpatient Hospital Stay: Payer: Managed Care, Other (non HMO)

## 2018-07-08 ENCOUNTER — Encounter: Payer: Self-pay | Admitting: Oncology

## 2018-07-08 VITALS — BP 123/78 | HR 80 | Temp 98.4°F | Resp 18 | Ht 71.0 in | Wt 160.7 lb

## 2018-07-08 DIAGNOSIS — Z9221 Personal history of antineoplastic chemotherapy: Secondary | ICD-10-CM

## 2018-07-08 DIAGNOSIS — Z5112 Encounter for antineoplastic immunotherapy: Secondary | ICD-10-CM | POA: Diagnosis not present

## 2018-07-08 DIAGNOSIS — I10 Essential (primary) hypertension: Secondary | ICD-10-CM | POA: Diagnosis not present

## 2018-07-08 DIAGNOSIS — C3412 Malignant neoplasm of upper lobe, left bronchus or lung: Secondary | ICD-10-CM

## 2018-07-08 DIAGNOSIS — Z923 Personal history of irradiation: Secondary | ICD-10-CM

## 2018-07-08 DIAGNOSIS — K208 Other esophagitis: Secondary | ICD-10-CM | POA: Diagnosis not present

## 2018-07-08 DIAGNOSIS — C321 Malignant neoplasm of supraglottis: Secondary | ICD-10-CM

## 2018-07-08 DIAGNOSIS — Z95828 Presence of other vascular implants and grafts: Secondary | ICD-10-CM

## 2018-07-08 DIAGNOSIS — R5382 Chronic fatigue, unspecified: Secondary | ICD-10-CM

## 2018-07-08 LAB — COMPREHENSIVE METABOLIC PANEL
ALT: 13 U/L (ref 0–44)
AST: 17 U/L (ref 15–41)
Albumin: 3.8 g/dL (ref 3.5–5.0)
Alkaline Phosphatase: 81 U/L (ref 38–126)
Anion gap: 7 (ref 5–15)
BUN: 8 mg/dL (ref 6–20)
CO2: 28 mmol/L (ref 22–32)
Calcium: 9.2 mg/dL (ref 8.9–10.3)
Chloride: 108 mmol/L (ref 98–111)
Creatinine, Ser: 0.67 mg/dL (ref 0.61–1.24)
GFR calc non Af Amer: >60 mL/min (ref >60)
Glucose, Bld: 115 mg/dL — ABNORMAL HIGH (ref 70–99)
POTASSIUM: 4 mmol/L (ref 3.5–5.1)
Sodium: 143 mmol/L (ref 135–145)
Total Bilirubin: 0.4 mg/dL (ref 0.3–1.2)
Total Protein: 6.4 g/dL — ABNORMAL LOW (ref 6.5–8.1)

## 2018-07-08 LAB — CBC WITH DIFFERENTIAL/PLATELET
Basophils Absolute: 0 10*3/uL (ref 0.0–0.1)
Basophils Relative: 1 %
Eosinophils Absolute: 0.1 10*3/uL (ref 0.0–0.5)
Eosinophils Relative: 1 %
HCT: 44.5 % (ref 38.4–49.9)
HEMOGLOBIN: 15.4 g/dL (ref 13.0–17.1)
LYMPHS PCT: 6 %
Lymphs Abs: 0.5 10*3/uL — ABNORMAL LOW (ref 0.9–3.3)
MCH: 33.7 pg — AB (ref 27.2–33.4)
MCHC: 34.5 g/dL (ref 32.0–36.0)
MCV: 97.7 fL (ref 79.3–98.0)
Monocytes Absolute: 0.5 10*3/uL (ref 0.1–0.9)
Monocytes Relative: 7 %
NEUTROS PCT: 85 %
Neutro Abs: 6.3 10*3/uL (ref 1.5–6.5)
Platelets: 238 10*3/uL (ref 140–400)
RBC: 4.56 MIL/uL (ref 4.20–5.82)
RDW: 13.1 % (ref 11.0–14.6)
WBC: 7.4 10*3/uL (ref 4.0–10.3)

## 2018-07-08 LAB — TSH: TSH: 1.775 u[IU]/mL (ref 0.320–4.118)

## 2018-07-08 MED ORDER — SODIUM CHLORIDE 0.9% FLUSH
10.0000 mL | INTRAVENOUS | Status: DC | PRN
  Administered 2018-07-08: 10 mL
  Filled 2018-07-08: qty 10

## 2018-07-08 MED ORDER — SODIUM CHLORIDE 0.9 % IV SOLN
Freq: Once | INTRAVENOUS | Status: AC
  Administered 2018-07-08: 13:00:00 via INTRAVENOUS
  Filled 2018-07-08: qty 250

## 2018-07-08 MED ORDER — SODIUM CHLORIDE 0.9% FLUSH
10.0000 mL | Freq: Once | INTRAVENOUS | Status: AC
  Administered 2018-07-08: 10 mL
  Filled 2018-07-08: qty 10

## 2018-07-08 MED ORDER — HEPARIN SOD (PORK) LOCK FLUSH 100 UNIT/ML IV SOLN
500.0000 [IU] | Freq: Once | INTRAVENOUS | Status: DC | PRN
  Filled 2018-07-08: qty 5

## 2018-07-08 MED ORDER — SODIUM CHLORIDE 0.9 % IV SOLN
200.0000 mg | Freq: Once | INTRAVENOUS | Status: AC
  Administered 2018-07-08: 200 mg via INTRAVENOUS
  Filled 2018-07-08: qty 8

## 2018-07-08 NOTE — Assessment & Plan Note (Signed)
This is a very pleasant 52 year old white male with recently diagnosed stage IIIB/IV non-small cell lung cancer, adenocarcinoma presented with large left hilar mass in addition to mediastinal and left supraclavicular lymphadenopathy as well as suspicious right upper lobe pulmonary nodule diagnosed in August 2018. 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 He underwent a course of concurrent chemoradiation to the lung as well as the epiglottic area under the care of Dr. Tammi Klippel. He is status post 6 cycles. He tolerated this course of treatment well except for the radiation induced esophagitis as well as weight loss and fatigue. The patient had partial response to the previous treatment. He is currently on treatment with Keytruda 200 mg IV every 3 weeks status post 14 cycles. The patient is tolerating this treatment well with no concerning complaints.  I recommended for him to proceed with cycle #15 today as scheduled. The patient will have a restaging CT scan of the neck and chest prior to his next visit.  He will follow-up in 3 weeks for evaluation prior to cycle #16 and to review his restaging CT scan results.  He was advised to call immediately if he has any concerning symptoms in the interval. The patient voices understanding of current disease status and treatment options and is in agreement with the current care plan. All questions were answered. The patient knows to call the clinic with any problems, questions or concerns. We can certainly see the patient much sooner if necessary.

## 2018-07-08 NOTE — Patient Instructions (Signed)
[facility] Discharge Instructions for Patients Receiving Chemotherapy  Today you received the following chemotherapy agents:  Keytruda.  To help prevent nausea and vomiting after your treatment, we encourage you to take your nausea medication as directed.   If you develop nausea and vomiting that is not controlled by your nausea medication, call the clinic.   BELOW ARE SYMPTOMS THAT SHOULD BE REPORTED IMMEDIATELY:  *FEVER GREATER THAN 100.5 F  *CHILLS WITH OR WITHOUT FEVER  NAUSEA AND VOMITING THAT IS NOT CONTROLLED WITH YOUR NAUSEA MEDICATION  *UNUSUAL SHORTNESS OF BREATH  *UNUSUAL BRUISING OR BLEEDING  TENDERNESS IN MOUTH AND THROAT WITH OR WITHOUT PRESENCE OF ULCERS  *URINARY PROBLEMS  *BOWEL PROBLEMS  UNUSUAL RASH Items with * indicate a potential emergency and should be followed up as soon as possible.  Feel free to call the clinic should you have any questions or concerns. The clinic phone number is [phone].  Please show the CHEMO ALERT CARD at check-in to the Emergency Department and triage nurse.    

## 2018-07-08 NOTE — Progress Notes (Signed)
[place] [facility] OFFICE PROGRESS NOTE  [name], [name], MD [place] [address]  DIAGNOSIS:  1) Stage IIB/IV (T3, N3, M1a) non-small cell lung cancer, squamous cell carcinoma presented with large left hilar mass in addition to mediastinal and left supraclavicular lymphadenopathy as well as contralateral right upper lobe nodule diagnosed in August 2018. PDL 1 expression: 90%. 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 First dose 06/29/2017. Status post 6 cycles. Last dose was given 08/03/2017.  CURRENT THERAPY: Immunotherapy with Ketruda 200 MG IV every 3 weeks, first dose 09/17/2017.  Status post 14 cycles.  INTERVAL HISTORY: Trevor Zavala 52 y.o. male returns for a routine follow-up visit by himself.  The patient is feeling fine today and has no concerning complaints.  He continues to tolerate treatment with [facility] fairly well.  He is scheduled to have a procedure performed by Dr. Redmond Baseman next week to help the hoarseness in his voice.  He denies fevers and chills.  Denies chest pain, shortness breath, cough, hemoptysis.  Denies nausea, vomiting, constipation, diarrhea.  Denies recent weight loss or night sweats.  The patient is here for evaluation prior to starting cycle #15 of his treatment.  MEDICAL HISTORY: Past Medical History:  Diagnosis Date  . Anxiety   . COPD (chronic obstructive pulmonary disease) ([place])   . GERD (gastroesophageal reflux disease)   . Headache   . Hypertension   . Pneumonia   . Situational depression   . Stage III squamous cell carcinoma of left lung ([place]) 06/19/2017   Lungs & Epiglottis  . Tubular adenoma of colon 01/2015    ALLERGIES:  is allergic to escitalopram oxalate.  MEDICATIONS:  Current Outpatient Medications  Medication Sig Dispense Refill  . acyclovir (ZOVIRAX) 400 MG tablet Take 1  tablet (400 mg total) by mouth 3 (three) times daily. 21 tablet 3  . clonazePAM (KLONOPIN) 0.5 MG tablet Take 1-2 tablets (0.5-1 mg total) by mouth 2 (two) times daily as needed for anxiety. 180 tablet 2  . diphenhydrAMINE (BENADRYL) 25 MG tablet Take 12.5 mg by mouth every 6 (six) hours as needed for itching.    . docusate sodium (COLACE) 100 MG capsule Take 100 mg daily by mouth.    . esomeprazole (NEXIUM) 40 MG capsule 1 po qam 90 capsule 3  . fluticasone furoate-vilanterol (BREO ELLIPTA) 100-25 MCG/INH AEPB INHALE 1 PUFF INTO THE LUNGS DAILY. 1 each 11  . loratadine (CLARITIN) 10 MG tablet Take 10 mg by mouth daily as needed for allergies.    . Multiple Vitamin (MULTIVITAMIN WITH MINERALS) TABS tablet Take 1 tablet by mouth daily.    . pembrolizumab (KEYTRUDA) 100 MG/4ML SOLN Inject 2 mg/kg into the vein. 200mg  every 3 weeks.    . ondansetron (ZOFRAN ODT) 4 MG disintegrating tablet Take 1 tablet (4 mg total) by mouth every 8 (eight) hours as needed for nausea or vomiting. (Patient not taking: Reported on 07/08/2018) 20 tablet 0   No current facility-administered medications for this visit.     SURGICAL HISTORY:  Past Surgical History:  Procedure Laterality Date  . COLONOSCOPY    . DIRECT LARYNGOSCOPY N/A 06/25/2017   Procedure: DIRECT LARYNGOSCOPY AND BIOPSY;  Surgeon: Rozetta Nunnery, MD;  Location: [place];  Service: ENT;  Laterality: N/A;  . EAR CYST EXCISION N/A 11/17/2013   Procedure: SEBACEOUS CYST CHEST;  Surgeon: Joyice Faster. Cornett, MD;  Location: [facility];  Service: General;  Laterality: N/A;  . IR GASTROSTOMY TUBE MOD SED  08/10/2017  . IR GASTROSTOMY TUBE REMOVAL  09/29/2017  . KNEE ARTHROSCOPY     LEFT  . LIPOMA EXCISION N/A 11/17/2013   Procedure: EXCISION LIPOMA FOREHEAD;  Surgeon: Joyice Faster. Cornett, MD;  Location: [facility];  Service: General;  Laterality: N/A;  . LUNG BIOPSY Bilateral 06/12/2017   Procedure: LEFT LUNG  BIOPSY;  Surgeon: Grace Isaac, MD;  Location: [facility];  Service: Thoracic;  Laterality: Bilateral;  . PORTACATH PLACEMENT Right 07/01/2017   Procedure: INSERTION PORT-A-CATH - RIGHT IJ - placed with Fluoro and Ultrasound;  Surgeon: Grace Isaac, MD;  Location: [facility];  Service: Thoracic;  Laterality: Right;  Marland Kitchen VIDEO BRONCHOSCOPY WITH ENDOBRONCHIAL ULTRASOUND N/A 06/12/2017   Procedure: VIDEO BRONCHOSCOPY WITH ENDOBRONCHIAL ULTRASOUND;  Surgeon: Grace Isaac, MD;  Location: [facility];  Service: Thoracic;  Laterality: N/A;    REVIEW OF SYSTEMS:   Review of Systems  Constitutional: Negative for appetite change, chills, fatigue, fever and unexpected weight change.  HENT:   Negative for mouth sores, nosebleeds, sore throat and trouble swallowing.  Positive for hoarseness in his voice. Eyes: Negative for eye problems and icterus.  Respiratory: Negative for cough, hemoptysis, shortness of breath and wheezing.   Cardiovascular: Negative for chest pain and leg swelling.  Gastrointestinal: Negative for abdominal pain, constipation, diarrhea, nausea and vomiting.  Genitourinary: Negative for bladder incontinence, difficulty urinating, dysuria, frequency and hematuria.   Musculoskeletal: Negative for back pain, gait problem, neck pain and neck stiffness.  Skin: Negative for itching and rash.  Neurological: Negative for dizziness, extremity weakness, gait problem, headaches, light-headedness and seizures.  Hematological: Negative for adenopathy. Does not bruise/bleed easily.  Psychiatric/Behavioral: Negative for confusion, depression and sleep disturbance. The patient is not nervous/anxious.     PHYSICAL EXAMINATION:  Blood pressure 123/78, pulse 80, temperature 98.4 F (36.9 C), temperature source Oral, resp. rate 18, height 5\' 11"  (1.803 m), weight 160 lb 11.2 oz (72.9 kg), SpO2 96 %.  ECOG PERFORMANCE STATUS: 1 - Symptomatic but completely ambulatory  Physical Exam  Constitutional:  Oriented to person, place, and time and well-developed, well-nourished, and in no distress. No distress.  HENT:  Head: Normocephalic and atraumatic.  Mouth/Throat: Oropharynx is clear and moist. No oropharyngeal exudate.  Eyes: Conjunctivae are normal. Right eye exhibits no discharge. Left eye exhibits no discharge. No scleral icterus.  Neck: Normal range of motion. Neck supple.  Cardiovascular: Normal rate, regular rhythm, normal heart sounds and intact distal pulses.   Pulmonary/Chest: Effort normal and breath sounds normal. No respiratory distress. No wheezes. No rales.  Abdominal: Soft. Bowel sounds are normal. Exhibits no distension and no mass. There is no tenderness.  Musculoskeletal: Normal range of motion. Exhibits no edema.  Lymphadenopathy:    No cervical adenopathy.  Neurological: Alert and oriented to person, place, and time. Exhibits normal muscle tone. Gait normal. Coordination normal.  Skin: Skin is warm and dry. No rash noted. Not diaphoretic. No erythema. No pallor.  Psychiatric: Mood, memory and judgment normal.  Vitals reviewed.  LABORATORY DATA: Lab Results  Component Value Date   WBC 7.4 07/08/2018   HGB 15.4 07/08/2018   HCT 44.5 07/08/2018   MCV 97.7 07/08/2018   PLT 238 07/08/2018      Chemistry      Component Value Date/Time   NA 143 07/08/2018 1020   NA  141 10/29/2017 1049   K 4.0 07/08/2018 1020   K 3.3 (L) 10/29/2017 1049   CL 108 07/08/2018 1020   CO2 28 07/08/2018 1020   CO2 26 10/29/2017 1049   BUN 8 07/08/2018 1020   BUN 11.2 10/29/2017 1049   CREATININE 0.67 07/08/2018 1020   CREATININE 0.7 10/29/2017 1049   GLU 198 08/25/2017      Component Value Date/Time   CALCIUM 9.2 07/08/2018 1020   CALCIUM 9.0 10/29/2017 1049   ALKPHOS 81 07/08/2018 1020   ALKPHOS 82 10/29/2017 1049   AST 17 07/08/2018 1020   AST 10 10/29/2017 1049   ALT 13 07/08/2018 1020   ALT 8 10/29/2017 1049   BILITOT 0.4 07/08/2018 1020   BILITOT 0.22 10/29/2017 1049        RADIOGRAPHIC STUDIES:  No results found.   ASSESSMENT/PLAN:  Squamous cell carcinoma of epiglottis [facility]) This is a very pleasant 52 year old white male with recently diagnosed stage IIIB/IV non-small cell lung cancer, adenocarcinoma presented with large left hilar mass in addition to mediastinal and left supraclavicular lymphadenopathy as well as suspicious right upper lobe pulmonary nodule diagnosed in August 2018. 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 He underwent a course of concurrent chemoradiation to the lung as well as the epiglottic area under the care of Dr. Tammi Klippel. He is status post 6 cycles. He tolerated this course of treatment well except for the radiation induced esophagitis as well as weight loss and fatigue. The patient had partial response to the previous treatment. He is currently on treatment with Keytruda 200 mg IV every 3 weeks status post 14 cycles. The patient is tolerating this treatment well with no concerning complaints.  I recommended for him to proceed with cycle #15 today as scheduled. The patient will have a restaging CT scan of the neck and chest prior to his next visit.  He will follow-up in 3 weeks for evaluation prior to cycle #16 and to review his restaging CT scan results.  He was advised to call immediately if he has any concerning symptoms in the interval. The patient voices understanding of current disease status and treatment options and is in agreement with the current care plan. All questions were answered. The patient knows to call the clinic with any problems, questions or concerns. We can certainly see the patient much sooner if necessary.   Orders Placed This Encounter  Procedures  . CT CHEST W CONTRAST    Standing Status:   Future    Standing Expiration Date:   07/09/2019    Order Specific Question:   If indicated for the ordered procedure, I authorize the administration of contrast  media per Radiology protocol    Answer:   Yes    Order Specific Question:   Preferred imaging location?    Answer:   [facility]    Order Specific Question:   Radiology Contrast Protocol - do NOT remove file path    Answer:   \\[email or web address]    Order Specific Question:   ** REASON FOR EXAM (FREE TEXT)    Answer:   Lung cancer and cancer of epiglottis. Restaging.  . CT Soft Tissue Neck W Contrast    Standing Status:   Future    Standing Expiration Date:   07/08/2019    Order Specific Question:   ** REASON FOR EXAM (FREE TEXT)    Answer:   Lung cancer and cancer of epiglottis. Restaging.  Order Specific Question:   If indicated for the ordered procedure, I authorize the administration of contrast media per Radiology protocol    Answer:   Yes    Order Specific Question:   Preferred imaging location?    Answer:   [facility]    Order Specific Question:   Radiology Contrast Protocol - do NOT remove file path    Answer:   \\[email or web address]     Mikey Bussing, DNP, AGPCNP-BC, AOCNP 07/08/18

## 2018-07-09 ENCOUNTER — Telehealth: Payer: Self-pay | Admitting: Oncology

## 2018-07-09 NOTE — Telephone Encounter (Signed)
Scheduled appt per 8/29 los - pt to get an updated schedule next visit.

## 2018-07-15 ENCOUNTER — Encounter (HOSPITAL_COMMUNITY): Payer: Self-pay | Admitting: Anesthesiology

## 2018-07-15 NOTE — Anesthesia Preprocedure Evaluation (Addendum)
Anesthesia Evaluation  Patient identified by MRN, date of birth, ID band Patient awake    Reviewed: Allergy & Precautions, NPO status , Patient's Chart, lab work & pertinent test results  Airway Mallampati: II  TM Distance: >3 FB Neck ROM: Full    Dental  (+) Teeth Intact, Dental Advisory Given   Pulmonary pneumonia, resolved, COPD,  COPD inhaler, former smoker,  Ca of left lung S/P ChemoRx and RT Ca of epiglottis/base of tongue with paralysis of left VC   Pulmonary exam normal breath sounds clear to auscultation       Cardiovascular hypertension, Normal cardiovascular exam Rhythm:Regular Rate:Normal     Neuro/Psych  Headaches, PSYCHIATRIC DISORDERS Anxiety Depression    GI/Hepatic Neg liver ROS, GERD  Medicated and Controlled,  Endo/Other  negative endocrine ROS  Renal/GU negative Renal ROS  negative genitourinary   Musculoskeletal negative musculoskeletal ROS (+)   Abdominal   Peds  Hematology negative hematology ROS (+)   Anesthesia Other Findings   Reproductive/Obstetrics                           Anesthesia Physical Anesthesia Plan  ASA: III  Anesthesia Plan: General   Post-op Pain Management:    Induction: Intravenous  PONV Risk Score and Plan: 4 or greater and Ondansetron, Dexamethasone and Treatment may vary due to age or medical condition  Airway Management Planned: Oral ETT  Additional Equipment:   Intra-op Plan:   Post-operative Plan: Extubation in OR  Informed Consent: I have reviewed the patients History and Physical, chart, labs and discussed the procedure including the risks, benefits and alternatives for the proposed anesthesia with the patient or authorized representative who has indicated his/her understanding and acceptance.   Dental advisory given  Plan Discussed with: CRNA, Surgeon and Anesthesiologist  Anesthesia Plan Comments:       Anesthesia  Quick Evaluation

## 2018-07-16 ENCOUNTER — Ambulatory Visit (HOSPITAL_COMMUNITY): Payer: Managed Care, Other (non HMO) | Admitting: Anesthesiology

## 2018-07-16 ENCOUNTER — Encounter (HOSPITAL_COMMUNITY): Payer: Self-pay | Admitting: Urology

## 2018-07-16 ENCOUNTER — Ambulatory Visit (HOSPITAL_COMMUNITY)
Admission: RE | Admit: 2018-07-16 | Discharge: 2018-07-16 | Disposition: A | Discharge status: Home / Self Care | Payer: Managed Care, Other (non HMO) | Source: Ambulatory Visit | Attending: Otolaryngology | Admitting: Otolaryngology

## 2018-07-16 ENCOUNTER — Encounter (HOSPITAL_COMMUNITY)
Admission: RE | Disposition: A | Discharge status: Home / Self Care | Payer: Self-pay | Source: Ambulatory Visit | Attending: Otolaryngology

## 2018-07-16 ENCOUNTER — Ambulatory Visit: Payer: Managed Care, Other (non HMO) | Admitting: Gastroenterology

## 2018-07-16 DIAGNOSIS — F329 Major depressive disorder, single episode, unspecified: Secondary | ICD-10-CM | POA: Diagnosis not present

## 2018-07-16 DIAGNOSIS — K219 Gastro-esophageal reflux disease without esophagitis: Secondary | ICD-10-CM | POA: Diagnosis not present

## 2018-07-16 DIAGNOSIS — C3492 Malignant neoplasm of unspecified part of left bronchus or lung: Secondary | ICD-10-CM | POA: Insufficient documentation

## 2018-07-16 DIAGNOSIS — Z923 Personal history of irradiation: Secondary | ICD-10-CM | POA: Diagnosis not present

## 2018-07-16 DIAGNOSIS — J449 Chronic obstructive pulmonary disease, unspecified: Secondary | ICD-10-CM | POA: Diagnosis not present

## 2018-07-16 DIAGNOSIS — Z888 Allergy status to other drugs, medicaments and biological substances status: Secondary | ICD-10-CM | POA: Insufficient documentation

## 2018-07-16 DIAGNOSIS — J3801 Paralysis of vocal cords and larynx, unilateral: Secondary | ICD-10-CM | POA: Diagnosis present

## 2018-07-16 DIAGNOSIS — Z79899 Other long term (current) drug therapy: Secondary | ICD-10-CM | POA: Insufficient documentation

## 2018-07-16 DIAGNOSIS — Z8521 Personal history of malignant neoplasm of larynx: Secondary | ICD-10-CM | POA: Diagnosis not present

## 2018-07-16 DIAGNOSIS — F419 Anxiety disorder, unspecified: Secondary | ICD-10-CM | POA: Diagnosis not present

## 2018-07-16 DIAGNOSIS — Z9221 Personal history of antineoplastic chemotherapy: Secondary | ICD-10-CM | POA: Insufficient documentation

## 2018-07-16 DIAGNOSIS — Z87891 Personal history of nicotine dependence: Secondary | ICD-10-CM | POA: Diagnosis not present

## 2018-07-16 DIAGNOSIS — R490 Dysphonia: Secondary | ICD-10-CM | POA: Insufficient documentation

## 2018-07-16 DIAGNOSIS — I10 Essential (primary) hypertension: Secondary | ICD-10-CM | POA: Diagnosis not present

## 2018-07-16 HISTORY — PX: MICROLARYNGOSCOPY W/VOCAL CORD INJECTION: SHX2665

## 2018-07-16 SURGERY — MICROLARYNGOSCOPY, WITH VOCAL CORD INJECTION
Anesthesia: General | Site: Throat | Laterality: Left

## 2018-07-16 MED ORDER — ONDANSETRON HCL 4 MG/2ML IJ SOLN: 4.0000 mg | Freq: Once | INTRAMUSCULAR | Status: DC | PRN

## 2018-07-16 MED ORDER — DEXAMETHASONE SODIUM PHOSPHATE 10 MG/ML IJ SOLN
INTRAMUSCULAR | Status: DC | PRN
  Administered 2018-07-16: 10 mg via INTRAVENOUS

## 2018-07-16 MED ORDER — EPHEDRINE 5 MG/ML INJ
INTRAVENOUS | Status: AC
  Filled 2018-07-16: qty 10

## 2018-07-16 MED ORDER — 0.9 % SODIUM CHLORIDE (POUR BTL) OPTIME
TOPICAL | Status: DC | PRN
  Administered 2018-07-16: 1000 mL

## 2018-07-16 MED ORDER — ESMOLOL HCL 100 MG/10ML IV SOLN
INTRAVENOUS | Status: DC | PRN
  Administered 2018-07-16: 50 mg via INTRAVENOUS

## 2018-07-16 MED ORDER — TRIAMCINOLONE ACETONIDE 40 MG/ML IJ SUSP
INTRAMUSCULAR | Status: AC
  Filled 2018-07-16: qty 5

## 2018-07-16 MED ORDER — PROPOFOL 10 MG/ML IV BOLUS
INTRAVENOUS | Status: DC | PRN
  Administered 2018-07-16: 120 mg via INTRAVENOUS

## 2018-07-16 MED ORDER — MEPERIDINE HCL 50 MG/ML IJ SOLN: 6.2500 mg | INTRAMUSCULAR | Status: DC | PRN

## 2018-07-16 MED ORDER — MIDAZOLAM HCL 5 MG/5ML IJ SOLN
INTRAMUSCULAR | Status: DC | PRN
  Administered 2018-07-16: 2 mg via INTRAVENOUS

## 2018-07-16 MED ORDER — FENTANYL CITRATE (PF) 250 MCG/5ML IJ SOLN
INTRAMUSCULAR | Status: AC
  Filled 2018-07-16: qty 5

## 2018-07-16 MED ORDER — SUGAMMADEX SODIUM 200 MG/2ML IV SOLN
INTRAVENOUS | Status: DC | PRN
  Administered 2018-07-16: 400 mg via INTRAVENOUS

## 2018-07-16 MED ORDER — OXYCODONE HCL 5 MG PO TABS: 5.0000 mg | ORAL_TABLET | Freq: Once | ORAL | Status: AC | PRN

## 2018-07-16 MED ORDER — LACTATED RINGERS IV SOLN
INTRAVENOUS | Status: DC | PRN
  Administered 2018-07-16: 07:00:00 via INTRAVENOUS

## 2018-07-16 MED ORDER — PROPOFOL 500 MG/50ML IV EMUL
INTRAVENOUS | Status: DC | PRN
  Administered 2018-07-16: 75 ug/kg/min via INTRAVENOUS

## 2018-07-16 MED ORDER — EPINEPHRINE HCL (NASAL) 0.1 % NA SOLN
NASAL | Status: AC
  Filled 2018-07-16: qty 30

## 2018-07-16 MED ORDER — PROPOFOL 10 MG/ML IV BOLUS
INTRAVENOUS | Status: AC
  Filled 2018-07-16: qty 20

## 2018-07-16 MED ORDER — FENTANYL CITRATE (PF) 100 MCG/2ML IJ SOLN
INTRAMUSCULAR | Status: DC | PRN
  Administered 2018-07-16: 100 ug via INTRAVENOUS
  Administered 2018-07-16: 50 ug via INTRAVENOUS

## 2018-07-16 MED ORDER — FENTANYL CITRATE (PF) 100 MCG/2ML IJ SOLN
25.0000 ug | INTRAMUSCULAR | Status: DC | PRN
  Administered 2018-07-16 (×2): 50 ug via INTRAVENOUS

## 2018-07-16 MED ORDER — MIDAZOLAM HCL 2 MG/2ML IJ SOLN
INTRAMUSCULAR | Status: AC
  Filled 2018-07-16: qty 2

## 2018-07-16 MED ORDER — ONDANSETRON HCL 4 MG/2ML IJ SOLN
INTRAMUSCULAR | Status: DC | PRN
  Administered 2018-07-16: 4 mg via INTRAVENOUS

## 2018-07-16 MED ORDER — EPINEPHRINE PF 1 MG/ML IJ SOLN
INTRAMUSCULAR | Status: DC | PRN
  Administered 2018-07-16: 30 mL

## 2018-07-16 MED ORDER — ROCURONIUM BROMIDE 10 MG/ML (PF) SYRINGE
PREFILLED_SYRINGE | INTRAVENOUS | Status: DC | PRN
  Administered 2018-07-16: 50 mg via INTRAVENOUS

## 2018-07-16 MED ORDER — OXYCODONE HCL 5 MG/5ML PO SOLN
5.0000 mg | Freq: Once | ORAL | Status: AC | PRN
  Administered 2018-07-16: 5 mg via ORAL

## 2018-07-16 MED ORDER — ONDANSETRON HCL 4 MG/2ML IJ SOLN
INTRAMUSCULAR | Status: AC
  Filled 2018-07-16: qty 2

## 2018-07-16 MED ORDER — FENTANYL CITRATE (PF) 100 MCG/2ML IJ SOLN
INTRAMUSCULAR | Status: AC
  Filled 2018-07-16: qty 2

## 2018-07-16 MED ORDER — SUCCINYLCHOLINE CHLORIDE 200 MG/10ML IV SOSY
PREFILLED_SYRINGE | INTRAVENOUS | Status: AC
  Filled 2018-07-16: qty 10

## 2018-07-16 MED ORDER — OXYCODONE HCL 5 MG/5ML PO SOLN
ORAL | Status: AC
  Filled 2018-07-16: qty 5

## 2018-07-16 SURGICAL SUPPLY — 28 items
CANISTER SUCT 3000ML PPV (MISCELLANEOUS) ×3 IMPLANT
CONT SPEC 4OZ CLIKSEAL STRL BL (MISCELLANEOUS) IMPLANT
COVER BACK TABLE 60X90IN (DRAPES) ×3 IMPLANT
COVER MAYO STAND STRL (DRAPES) ×3 IMPLANT
CRADLE DONUT ADULT HEAD (MISCELLANEOUS) IMPLANT
DRAPE HALF SHEET 40X57 (DRAPES) ×3 IMPLANT
GAUZE SPONGE 4X4 12PLY STRL (GAUZE/BANDAGES/DRESSINGS) ×3 IMPLANT
GLOVE BIO SURGEON STRL SZ7.5 (GLOVE) ×3 IMPLANT
GOWN STRL REUS W/ TWL LRG LVL3 (GOWN DISPOSABLE) IMPLANT
GOWN STRL REUS W/TWL LRG LVL3 (GOWN DISPOSABLE)
GUARD TEETH (MISCELLANEOUS) IMPLANT
KIT BASIN OR (CUSTOM PROCEDURE TRAY) ×3 IMPLANT
KIT PROLARN PLUS GEL W/NDL (Miscellaneous) ×2 IMPLANT
KIT TURNOVER KIT B (KITS) ×3 IMPLANT
NDL HYPO 25GX1X1/2 BEV (NEEDLE) IMPLANT
NDL TRANS ORAL INJECTION (NEEDLE) IMPLANT
NEEDLE HYPO 25GX1X1/2 BEV (NEEDLE) IMPLANT
NEEDLE TRANS ORAL INJECTION (NEEDLE) IMPLANT
NS IRRIG 1000ML POUR BTL (IV SOLUTION) ×3 IMPLANT
PAD ARMBOARD 7.5X6 YLW CONV (MISCELLANEOUS) ×6 IMPLANT
PATTIES SURGICAL .5 X1 (DISPOSABLE) IMPLANT
PATTIES SURGICAL .5 X3 (DISPOSABLE) IMPLANT
SOLUTION ANTI FOG 6CC (MISCELLANEOUS) ×3 IMPLANT
SURGILUBE 2OZ TUBE FLIPTOP (MISCELLANEOUS) IMPLANT
TOWEL OR 17X24 6PK STRL BLUE (TOWEL DISPOSABLE) ×6 IMPLANT
TUBE CONNECTING 12'X1/4 (SUCTIONS) ×1
TUBE CONNECTING 12X1/4 (SUCTIONS) ×2 IMPLANT
WATER STERILE IRR 1000ML POUR (IV SOLUTION) IMPLANT

## 2018-07-16 NOTE — Op Note (Signed)
NAME: Trevor Zavala, Trevor Zavala MEDICAL RECORD [id number] ACCOUNT [id number] DATE OF BIRTH:Apr 08, 1966 FACILITY: [facility] LOCATION: [facility] PHYSICIAN:Kiasha Bellin D. Layce Sprung, MD  OPERATIVE REPORT  DATE OF PROCEDURE:  07/16/2018  PREOPERATIVE DIAGNOSES: 1.  Left vocal cord paralysis. 2.  Dysphonia. 3.  Lung cancer. 4.  History of laryngeal cancer.  POSTOPERATIVE DIAGNOSES: 1.  Left vocal cord paralysis. 2.  Dysphonia. 3.  Lung cancer. 4.  History of laryngeal cancer.  PROCEDURE:  Suspended microdirect laryngoscopy with Prolaryn injection.  SURGEON:  Melida Quitter, MD  ANESTHESIA:  General, jet Venturi ventilation.  COMPLICATIONS:  None.  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  He has had paralysis of the left vocal fold  thought to be due to lung cancer and this is causing significant dysphonia.  He presents to the operating room for surgical management.  FINDINGS:  The tissues of the larynx had the appearance of post-radiation changes with no distinct mass or ulceration.  0.5 mL of Prolaryn were injected in total in the left vocal fold and 0.2 mL in the right vocal fold with about two thirds injected  posteriorly and one-third anteriorly.  DESCRIPTION OF PROCEDURE:  The patient was identified in the holding room, informed consent having been obtained including discussion of risks, benefits and alternatives, the patient was brought to the operative suite and put the operative table in  supine position.  The bed was turned 90 degrees from Anesthesia and anesthesia was induced.  The eyes were taped closed and the tooth guard was placed over the upper teeth.  A Stortz laryngoscope was inserted into the supraglottic position and then  suspended to the Mayo stand using the Lewy arm.  Jet Venturi ventilation was initiated.  The larynx was  photographed with 0 degree telescope.  Prolaryn was then injected under the operating microscope, first in the left vocal fold and then into the  right, totalling 0.5 mL and the left cord and 0.2 mL in the right cord with 2/3 and posterior 1/3 anterior in each cord.  After this was completed, the 0 degree telescope was used to make a postoperative photograph.  The larynx was then sprayed with  topical lidocaine.  The laryngoscope was taken out of suspension and removed from the patient's mouth while suctioning the airway.  The tooth guard was removed and was returned to mask ventilation.    He was turned back to Anesthesia for wakeup was admitted to the recovery room in stable condition.  AN/NUANCE  D:07/16/2018 T:07/16/2018 JOB:[id number]

## 2018-07-16 NOTE — Anesthesia Postprocedure Evaluation (Signed)
Anesthesia Post Note  Patient: Trevor Zavala  Procedure(s) Performed: suspened micro laryngoscopy with jet ventilation and prolaryn injection (Left Throat)     Patient location during evaluation: PACU Anesthesia Type: General Level of consciousness: awake and alert and oriented Pain management: pain level controlled Vital Signs Assessment: post-procedure vital signs reviewed and stable Respiratory status: spontaneous breathing, nonlabored ventilation and respiratory function stable Cardiovascular status: blood pressure returned to baseline and stable Postop Assessment: no apparent nausea or vomiting Anesthetic complications: no    Last Vitals:  Vitals:   07/16/18 0830 07/16/18 0845  BP: 129/80 124/74  Pulse: 83 83  Resp: 15 18  Temp:    SpO2: 97% 98%    Last Pain:  Vitals:   07/16/18 0845  TempSrc:   PainSc: 6                  [name]

## 2018-07-16 NOTE — H&P (Signed)
Trevor Zavala is an 52 y.o. male.   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  He presents for surgical management.  Past Medical History:  Diagnosis Date  . Anxiety   . COPD (chronic obstructive pulmonary disease) ([place])   . GERD (gastroesophageal reflux disease)   . Headache   . Hypertension   . Pneumonia   . Situational depression   . 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 Tubular adenoma of colon 01/2015    Past Surgical History:  Procedure Laterality Date  . COLONOSCOPY    . DIRECT LARYNGOSCOPY N/A 06/25/2017   Procedure: DIRECT LARYNGOSCOPY AND BIOPSY;  Surgeon: Rozetta Nunnery, MD;  Location: [facility];  Service: ENT;  Laterality: N/A;  . EAR CYST EXCISION N/A 11/17/2013   Procedure: SEBACEOUS CYST CHEST;  Surgeon: Joyice Faster. Cornett, MD;  Location: [facility];  Service: General;  Laterality: N/A;  . IR GASTROSTOMY TUBE MOD SED  08/10/2017  . IR GASTROSTOMY TUBE REMOVAL  09/29/2017  . KNEE ARTHROSCOPY     LEFT  . LIPOMA EXCISION N/A 11/17/2013   Procedure: EXCISION LIPOMA FOREHEAD;  Surgeon: Joyice Faster. Cornett, MD;  Location: [facility];  Service: General;  Laterality: N/A;  . LUNG BIOPSY Bilateral 06/12/2017   Procedure: LEFT LUNG BIOPSY;  Surgeon: Grace Isaac, MD;  Location: [facility];  Service: Thoracic;  Laterality: Bilateral;  . PORTACATH PLACEMENT Right 07/01/2017   Procedure: INSERTION PORT-A-CATH - RIGHT IJ - placed with Fluoro and Ultrasound;  Surgeon: Grace Isaac, MD;  Location: [facility];  Service: Thoracic;  Laterality: Right;  Marland Kitchen VIDEO BRONCHOSCOPY WITH ENDOBRONCHIAL ULTRASOUND N/A 06/12/2017   Procedure: VIDEO BRONCHOSCOPY WITH ENDOBRONCHIAL ULTRASOUND;  Surgeon: Grace Isaac, MD;  Location: [facility];  Service: Thoracic;   Laterality: N/A;    Family History  Problem Relation Age of Onset  . Breast cancer Mother   . Hypertension Mother   . Colon cancer Mother   . Skin cancer Father   . Hypertension Father   . Colon polyps Father   . Cancer Father 75       b cell lymphoma  . Colon cancer Father    Social History:  reports that he quit smoking about 11 months ago. He has a 8.75 pack-year smoking history. He has never used smokeless tobacco. He reports that he drinks alcohol. He reports that he does not use drugs.  Allergies:  Allergies  Allergen Reactions  . Escitalopram Oxalate     "Bad thoughts" "Bad thoughts"    Medications Prior to Admission  Medication Sig Dispense Refill  . clonazePAM (KLONOPIN) 0.5 MG tablet Take 1-2 tablets (0.5-1 mg total) by mouth 2 (two) times daily as needed for anxiety. 180 tablet 2  . diphenhydrAMINE (BENADRYL) 25 MG tablet Take 12.5 mg by mouth every 6 (six) hours as needed for itching.    . esomeprazole (NEXIUM) 40 MG capsule 1 po qam 90 capsule 3  . fluticasone furoate-vilanterol (BREO ELLIPTA) 100-25 MCG/INH AEPB INHALE 1 PUFF INTO THE LUNGS DAILY. 1 each 11  . loratadine (CLARITIN) 10 MG tablet Take 10 mg by mouth daily as needed for allergies.    . pembrolizumab (KEYTRUDA) 100 MG/4ML SOLN Inject 2 mg/kg into the vein. 200mg  every 3 weeks.    Marland Kitchen  acyclovir (ZOVIRAX) 400 MG tablet Take 1 tablet (400 mg total) by mouth 3 (three) times daily. 21 tablet 3  . docusate sodium (COLACE) 100 MG capsule Take 100 mg daily by mouth.    . Multiple Vitamin (MULTIVITAMIN WITH MINERALS) TABS tablet Take 1 tablet by mouth daily.    . ondansetron (ZOFRAN ODT) 4 MG disintegrating tablet Take 1 tablet (4 mg total) by mouth every 8 (eight) hours as needed for nausea or vomiting. (Patient not taking: Reported on 07/08/2018) 20 tablet 0    No results found for this or any previous visit (from the past 48 hour(s)). No results found.  Review of Systems  All other systems reviewed and are  negative.   Blood pressure 132/72, pulse 76, temperature 97.9 F (36.6 C), temperature source Oral, resp. rate 20, SpO2 97 %. Physical Exam  Constitutional: He is oriented to person, place, and time. He appears well-developed and well-nourished. No distress.  HENT:  Head: Normocephalic and atraumatic.  Right Ear: External ear normal.  Left Ear: External ear normal.  Nose: Nose normal.  Mouth/Throat: Oropharynx is clear and moist.  Low-pitched, monotone, somewhat breathy hoarseness.  Eyes: Pupils are equal, round, and reactive to light. Conjunctivae and EOM are normal.  Neck: Normal range of motion. Neck supple.  Cardiovascular: Normal rate.  Respiratory: Effort normal.  Musculoskeletal: Normal range of motion.  Neurological: He is alert and oriented to person, place, and time. No cranial nerve deficit.  Skin: Skin is warm and dry.  Psychiatric: He has a normal mood and affect. His behavior is normal. Judgment and thought content normal.     Assessment/Plan Dysphonia, left vocal fold paralysis, lung cancer, history of laryngeal cancer To OR for SMDL with Prolaryn injection.  Melida Quitter, MD 07/16/2018, 7:28 AM

## 2018-07-16 NOTE — Brief Op Note (Signed)
07/16/2018  8:20 AM  PATIENT:  Trevor Zavala  52 y.o. 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   Delay start of Pharmacological VTE agent (>24hrs) due to surgical blood loss or risk of bleeding: no

## 2018-07-16 NOTE — Transfer of Care (Signed)
Immediate Anesthesia Transfer of Care Note  Patient: Trevor Zavala  Procedure(s) Performed: suspened micro laryngoscopy with jet ventilation and prolaryn injection (Left Throat)  Patient Location: PACU  Anesthesia Type:General  Level of Consciousness: awake, alert , oriented and patient cooperative  Airway & Oxygen Therapy: Patient Spontanous Breathing and Patient connected to nasal cannula oxygen  Post-op Assessment: Report given to RN, Post -op Vital signs reviewed and stable and Patient moving all extremities X 4  Post vital signs: Reviewed and stable  Last Vitals:  Vitals Value Taken Time  BP 133/85 07/16/2018  8:19 AM  Temp 36.4 C 07/16/2018  8:18 AM  Pulse 82 07/16/2018  8:22 AM  Resp 15 07/16/2018  8:22 AM  SpO2 98 % 07/16/2018  8:22 AM  Vitals shown include unvalidated device data.  Last Pain:  Vitals:   07/16/18 0818  TempSrc:   PainSc: 0-No pain      Patients Stated Pain Goal: 0 (09/81/19 1478)  Complications: No apparent anesthesia complications

## 2018-07-19 ENCOUNTER — Encounter (HOSPITAL_COMMUNITY): Payer: Self-pay | Admitting: Otolaryngology

## 2018-07-21 ENCOUNTER — Telehealth: Payer: Self-pay | Admitting: *Deleted

## 2018-07-21 NOTE — Telephone Encounter (Signed)
Faxed ROI to [employer]; release [phone]

## 2018-07-27 ENCOUNTER — Encounter (HOSPITAL_COMMUNITY): Payer: Self-pay

## 2018-07-27 ENCOUNTER — Ambulatory Visit (HOSPITAL_COMMUNITY)
Admission: RE | Admit: 2018-07-27 | Discharge: 2018-07-27 | Disposition: A | Discharge status: Home / Self Care | Payer: Managed Care, Other (non HMO) | Source: Ambulatory Visit | Attending: Oncology | Admitting: Oncology

## 2018-07-27 DIAGNOSIS — C3412 Malignant neoplasm of upper lobe, left bronchus or lung: Secondary | ICD-10-CM | POA: Insufficient documentation

## 2018-07-27 DIAGNOSIS — J439 Emphysema, unspecified: Secondary | ICD-10-CM | POA: Diagnosis not present

## 2018-07-27 DIAGNOSIS — I700 Atherosclerosis of aorta: Secondary | ICD-10-CM | POA: Insufficient documentation

## 2018-07-27 DIAGNOSIS — Z923 Personal history of irradiation: Secondary | ICD-10-CM | POA: Diagnosis not present

## 2018-07-27 DIAGNOSIS — J3800 Paralysis of vocal cords and larynx, unspecified: Secondary | ICD-10-CM | POA: Diagnosis not present

## 2018-07-27 DIAGNOSIS — C321 Malignant neoplasm of supraglottis: Secondary | ICD-10-CM | POA: Insufficient documentation

## 2018-07-27 MED ORDER — IOHEXOL 300 MG/ML  SOLN
75.0000 mL | Freq: Once | INTRAMUSCULAR | Status: AC | PRN
  Administered 2018-07-27: 75 mL via INTRAVENOUS

## 2018-07-29 ENCOUNTER — Inpatient Hospital Stay: Payer: Managed Care, Other (non HMO) | Attending: Internal Medicine

## 2018-07-29 ENCOUNTER — Encounter: Payer: Self-pay | Admitting: Internal Medicine

## 2018-07-29 ENCOUNTER — Inpatient Hospital Stay: Payer: Managed Care, Other (non HMO)

## 2018-07-29 ENCOUNTER — Inpatient Hospital Stay (HOSPITAL_BASED_OUTPATIENT_CLINIC_OR_DEPARTMENT_OTHER): Payer: Managed Care, Other (non HMO) | Admitting: Internal Medicine

## 2018-07-29 ENCOUNTER — Encounter: Payer: Self-pay | Admitting: *Deleted

## 2018-07-29 ENCOUNTER — Telehealth: Payer: Self-pay | Admitting: Internal Medicine

## 2018-07-29 VITALS — BP 137/81 | HR 74 | Temp 98.0°F | Resp 18 | Ht 71.0 in | Wt 164.1 lb

## 2018-07-29 DIAGNOSIS — Y842 Radiological procedure and radiotherapy as the cause of abnormal reaction of the patient, or of later complication, without mention of misadventure at the time of the procedure: Secondary | ICD-10-CM | POA: Insufficient documentation

## 2018-07-29 DIAGNOSIS — K210 Gastro-esophageal reflux disease with esophagitis: Secondary | ICD-10-CM | POA: Insufficient documentation

## 2018-07-29 DIAGNOSIS — Z5112 Encounter for antineoplastic immunotherapy: Secondary | ICD-10-CM | POA: Insufficient documentation

## 2018-07-29 DIAGNOSIS — R5383 Other fatigue: Secondary | ICD-10-CM | POA: Diagnosis not present

## 2018-07-29 DIAGNOSIS — Z9221 Personal history of antineoplastic chemotherapy: Secondary | ICD-10-CM | POA: Insufficient documentation

## 2018-07-29 DIAGNOSIS — Z95828 Presence of other vascular implants and grafts: Secondary | ICD-10-CM

## 2018-07-29 DIAGNOSIS — R634 Abnormal weight loss: Secondary | ICD-10-CM | POA: Insufficient documentation

## 2018-07-29 DIAGNOSIS — C321 Malignant neoplasm of supraglottis: Secondary | ICD-10-CM

## 2018-07-29 DIAGNOSIS — I10 Essential (primary) hypertension: Secondary | ICD-10-CM | POA: Diagnosis not present

## 2018-07-29 DIAGNOSIS — K208 Other esophagitis: Secondary | ICD-10-CM | POA: Insufficient documentation

## 2018-07-29 DIAGNOSIS — R5382 Chronic fatigue, unspecified: Secondary | ICD-10-CM

## 2018-07-29 DIAGNOSIS — R490 Dysphonia: Secondary | ICD-10-CM

## 2018-07-29 DIAGNOSIS — J449 Chronic obstructive pulmonary disease, unspecified: Secondary | ICD-10-CM | POA: Insufficient documentation

## 2018-07-29 DIAGNOSIS — C3412 Malignant neoplasm of upper lobe, left bronchus or lung: Secondary | ICD-10-CM | POA: Diagnosis not present

## 2018-07-29 DIAGNOSIS — Z923 Personal history of irradiation: Secondary | ICD-10-CM | POA: Diagnosis not present

## 2018-07-29 DIAGNOSIS — Z79899 Other long term (current) drug therapy: Secondary | ICD-10-CM | POA: Insufficient documentation

## 2018-07-29 LAB — CBC WITH DIFFERENTIAL/PLATELET
Basophils Absolute: 0 10*3/uL (ref 0.0–0.1)
Basophils Relative: 1 %
EOS ABS: 0.1 10*3/uL (ref 0.0–0.5)
EOS PCT: 2 %
HCT: 45.2 % (ref 38.4–49.9)
Hemoglobin: 15.5 g/dL (ref 13.0–17.1)
LYMPHS ABS: 0.5 10*3/uL — AB (ref 0.9–3.3)
LYMPHS PCT: 10 %
MCH: 33.5 pg — AB (ref 27.2–33.4)
MCHC: 34.2 g/dL (ref 32.0–36.0)
MCV: 97.9 fL (ref 79.3–98.0)
MONOS PCT: 6 %
Monocytes Absolute: 0.3 10*3/uL (ref 0.1–0.9)
Neutro Abs: 4.4 10*3/uL (ref 1.5–6.5)
Neutrophils Relative %: 81 %
PLATELETS: 211 10*3/uL (ref 140–400)
RBC: 4.62 MIL/uL (ref 4.20–5.82)
RDW: 12.3 % (ref 11.0–14.6)
WBC: 5.4 10*3/uL (ref 4.0–10.3)

## 2018-07-29 LAB — COMPREHENSIVE METABOLIC PANEL
ALBUMIN: 3.6 g/dL (ref 3.5–5.0)
ALT: 9 U/L (ref 0–44)
AST: 13 U/L — AB (ref 15–41)
Alkaline Phosphatase: 80 U/L (ref 38–126)
Anion gap: 8 (ref 5–15)
BUN: 8 mg/dL (ref 6–20)
CHLORIDE: 108 mmol/L (ref 98–111)
CO2: 27 mmol/L (ref 22–32)
Calcium: 9.2 mg/dL (ref 8.9–10.3)
Creatinine, Ser: 0.74 mg/dL (ref 0.61–1.24)
GFR calc Af Amer: >60 mL/min (ref >60)
GFR calc non Af Amer: >60 mL/min (ref >60)
GLUCOSE: 125 mg/dL — AB (ref 70–99)
Potassium: 4.2 mmol/L (ref 3.5–5.1)
SODIUM: 143 mmol/L (ref 135–145)
Total Bilirubin: 0.3 mg/dL (ref 0.3–1.2)
Total Protein: 6.4 g/dL — ABNORMAL LOW (ref 6.5–8.1)

## 2018-07-29 LAB — TSH: TSH: 2.188 u[IU]/mL (ref 0.320–4.118)

## 2018-07-29 MED ORDER — SODIUM CHLORIDE 0.9% FLUSH
10.0000 mL | Freq: Once | INTRAVENOUS | Status: AC
  Administered 2018-07-29: 10 mL
  Filled 2018-07-29: qty 10

## 2018-07-29 MED ORDER — SODIUM CHLORIDE 0.9 % IV SOLN
Freq: Once | INTRAVENOUS | Status: AC
  Administered 2018-07-29: 11:00:00 via INTRAVENOUS
  Filled 2018-07-29: qty 250

## 2018-07-29 MED ORDER — SODIUM CHLORIDE 0.9 % IV SOLN
200.0000 mg | Freq: Once | INTRAVENOUS | Status: AC
  Administered 2018-07-29: 200 mg via INTRAVENOUS
  Filled 2018-07-29: qty 8

## 2018-07-29 MED ORDER — HEPARIN SOD (PORK) LOCK FLUSH 100 UNIT/ML IV SOLN
500.0000 [IU] | Freq: Once | INTRAVENOUS | Status: AC | PRN
  Administered 2018-07-29: 500 [IU]
  Filled 2018-07-29: qty 5

## 2018-07-29 MED ORDER — SODIUM CHLORIDE 0.9% FLUSH
10.0000 mL | INTRAVENOUS | Status: DC | PRN
  Administered 2018-07-29: 10 mL
  Filled 2018-07-29: qty 10

## 2018-07-29 NOTE — Patient Instructions (Signed)
[facility] Discharge Instructions for Patients Receiving Chemotherapy  Today you received the following chemotherapy agents:  Keytruda.  To help prevent nausea and vomiting after your treatment, we encourage you to take your nausea medication as directed.   If you develop nausea and vomiting that is not controlled by your nausea medication, call the clinic.   BELOW ARE SYMPTOMS THAT SHOULD BE REPORTED IMMEDIATELY:  *FEVER GREATER THAN 100.5 F  *CHILLS WITH OR WITHOUT FEVER  NAUSEA AND VOMITING THAT IS NOT CONTROLLED WITH YOUR NAUSEA MEDICATION  *UNUSUAL SHORTNESS OF BREATH  *UNUSUAL BRUISING OR BLEEDING  TENDERNESS IN MOUTH AND THROAT WITH OR WITHOUT PRESENCE OF ULCERS  *URINARY PROBLEMS  *BOWEL PROBLEMS  UNUSUAL RASH Items with * indicate a potential emergency and should be followed up as soon as possible.  Feel free to call the clinic should you have any questions or concerns. The clinic phone number is [phone].  Please show the CHEMO ALERT CARD at check-in to the Emergency Department and triage nurse.    

## 2018-07-29 NOTE — Progress Notes (Signed)
Oncology Nurse Navigator Documentation  Oncology Nurse Navigator Flowsheets 07/29/2018  Navigator Location [facility]  Navigator Encounter Type Clinic/MDC/I spoke with patient and wife today at [facility].  He is feeling better and has improvement with his voice.  I help to educated on treatment plan with IO therapy for 2 years with scans every 3 cycles.  They verbalized understanding.  Treatment Phase Treatment  Barriers/Navigation Needs Education  Education Other  Interventions Education  Education Method Verbal  Acuity Level 1  Time Spent with Patient 15

## 2018-07-29 NOTE — Progress Notes (Signed)
[place] [phone]   [phone]  OFFICE PROGRESS NOTE  [name], [name], MD [place] [address]  DIAGNOSIS:  1) Stage IIB/IV (T3, N3, M1a) non-small cell lung cancer, squamous cell carcinoma presented with large left hilar mass in addition to mediastinal and left supraclavicular lymphadenopathy as well as contralateral right upper lobe nodule diagnosed in August 2018. PDL 1 expression: 90%. 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 First dose 06/29/2017. Status post 6 cycles. Last dose was given 08/03/2017.  CURRENT THERAPY: Immunotherapy with Ketruda 200 MG IV every 3 weeks, first dose 09/17/2017.  Status post 15 cycles.  INTERVAL HISTORY: Trevor Zavala 52 y.o. male returns to the clinic today for follow-up visit accompanied by his wife.  The patient is feeling fine today with no specific complaints.  He has vocal cord implants by Dr. Redmond Baseman few weeks ago and he is feeling a little bit better.  He still have hoarseness of his voice.  He denied having any chest pain, shortness of breath, cough or hemoptysis.  He has no nausea, vomiting, diarrhea or constipation.  He has no significant weight loss or night sweats.  He has no headache or visual changes.  He continues to tolerate his treatment with Keytruda fairly well.  The patient had repeat CT scan of the neck and chest performed recently and he is here today for evaluation and discussion of his discuss results.  MEDICAL HISTORY: Past Medical History:  Diagnosis Date  . Anxiety   . COPD (chronic obstructive pulmonary disease) ([place])   . GERD (gastroesophageal reflux disease)   . Headache   . Hypertension   . Pneumonia   . Situational depression   . Stage III squamous cell carcinoma of left lung ([place]) 06/19/2017   Lungs & Epiglottis  . Tubular  adenoma of colon 01/2015    ALLERGIES:  is allergic to escitalopram oxalate.  MEDICATIONS:  Current Outpatient Medications  Medication Sig Dispense Refill  . acyclovir (ZOVIRAX) 400 MG tablet Take 1 tablet (400 mg total) by mouth 3 (three) times daily. 21 tablet 3  . clonazePAM (KLONOPIN) 0.5 MG tablet Take 1-2 tablets (0.5-1 mg total) by mouth 2 (two) times daily as needed for anxiety. 180 tablet 2  . diphenhydrAMINE (BENADRYL) 25 MG tablet Take 12.5 mg by mouth every 6 (six) hours as needed for itching.    . docusate sodium (COLACE) 100 MG capsule Take 100 mg daily by mouth.    . esomeprazole (NEXIUM) 40 MG capsule 1 po qam 90 capsule 3  . fluticasone furoate-vilanterol (BREO ELLIPTA) 100-25 MCG/INH AEPB INHALE 1 PUFF INTO THE LUNGS DAILY. 1 each 11  . loratadine (CLARITIN) 10 MG tablet Take 10 mg by mouth daily as needed for allergies.    . Multiple Vitamin (MULTIVITAMIN WITH MINERALS) TABS tablet Take 1 tablet by mouth daily.    . pembrolizumab (KEYTRUDA) 100 MG/4ML SOLN Inject 2 mg/kg into the vein. 200mg  every 3 weeks.    . ondansetron (ZOFRAN ODT) 4 MG disintegrating tablet Take 1 tablet (4 mg total) by mouth every 8 (eight) hours as needed for nausea or vomiting. (Patient not taking: Reported on 07/08/2018) 20 tablet 0   No current facility-administered medications for this visit.     SURGICAL HISTORY:  Past Surgical History:  Procedure Laterality Date  . COLONOSCOPY    .  DIRECT LARYNGOSCOPY N/A 06/25/2017   Procedure: DIRECT LARYNGOSCOPY AND BIOPSY;  Surgeon: Rozetta Nunnery, MD;  Location: [place];  Service: ENT;  Laterality: N/A;  . EAR CYST EXCISION N/A 11/17/2013   Procedure: SEBACEOUS CYST CHEST;  Surgeon: Joyice Faster. Cornett, MD;  Location: [place];  Service: General;  Laterality: N/A;  . IR GASTROSTOMY TUBE MOD SED  08/10/2017  . IR GASTROSTOMY TUBE REMOVAL  09/29/2017  . KNEE ARTHROSCOPY     LEFT  . LIPOMA EXCISION N/A 11/17/2013    Procedure: EXCISION LIPOMA FOREHEAD;  Surgeon: Joyice Faster. Cornett, MD;  Location: [place] [facility];  Service: General;  Laterality: N/A;  . LUNG BIOPSY Bilateral 06/12/2017   Procedure: LEFT LUNG BIOPSY;  Surgeon: Grace Isaac, MD;  Location: [facility];  Service: Thoracic;  Laterality: Bilateral;  . MICROLARYNGOSCOPY W/VOCAL CORD INJECTION Left 07/16/2018   Procedure: suspened micro laryngoscopy with jet ventilation and prolaryn injection;  Surgeon: Melida Quitter, MD;  Location: [facility];  Service: ENT;  Laterality: Left;  . PORTACATH PLACEMENT Right 07/01/2017   Procedure: INSERTION PORT-A-CATH - RIGHT IJ - placed with Fluoro and Ultrasound;  Surgeon: Grace Isaac, MD;  Location: [facility];  Service: Thoracic;  Laterality: Right;  Marland Kitchen VIDEO BRONCHOSCOPY WITH ENDOBRONCHIAL ULTRASOUND N/A 06/12/2017   Procedure: VIDEO BRONCHOSCOPY WITH ENDOBRONCHIAL ULTRASOUND;  Surgeon: Grace Isaac, MD;  Location: [facility] [place];  Service: Thoracic;  Laterality: N/A;    REVIEW OF SYSTEMS:  Constitutional: negative Eyes: negative Ears, nose, mouth, throat, and face: positive for hoarseness Respiratory: negative Cardiovascular: negative Gastrointestinal: negative Genitourinary:negative Integument/breast: negative Hematologic/lymphatic: negative Musculoskeletal:negative Neurological: negative Behavioral/Psych: negative Endocrine: negative Allergic/Immunologic: negative   PHYSICAL EXAMINATION: General appearance: alert, cooperative and no distress Head: Normocephalic, without obvious abnormality, atraumatic Neck: no adenopathy, no JVD, supple, symmetrical, trachea midline and thyroid not enlarged, symmetric, no tenderness/mass/nodules Lymph nodes: Cervical, supraclavicular, and axillary nodes normal. Resp: clear to auscultation bilaterally Back: symmetric, no curvature. ROM normal. No CVA tenderness. Cardio: regular rate and rhythm, S1, S2 normal, no murmur, click, rub or gallop GI: soft, non-tender; bowel  sounds normal; no masses,  no organomegaly Extremities: extremities normal, atraumatic, no cyanosis or edema Neurologic: Alert and oriented X 3, normal strength and tone. Normal symmetric reflexes. Normal coordination and gait  ECOG PERFORMANCE STATUS: 1 - Symptomatic but completely ambulatory  Blood pressure 137/81, pulse 74, temperature 98 F (36.7 C), temperature source Oral, resp. rate 18, height 5\' 11"  (1.803 m), weight 164 lb 1.6 oz (74.4 kg), SpO2 95 %.  LABORATORY DATA: Lab Results  Component Value Date   WBC 5.4 07/29/2018   HGB 15.5 07/29/2018   HCT 45.2 07/29/2018   MCV 97.9 07/29/2018   PLT 211 07/29/2018      Chemistry      Component Value Date/Time   NA 143 07/29/2018 0849   NA 141 10/29/2017 1049   K 4.2 07/29/2018 0849   K 3.3 (L) 10/29/2017 1049   CL 108 07/29/2018 0849   CO2 27 07/29/2018 0849   CO2 26 10/29/2017 1049   BUN 8 07/29/2018 0849   BUN 11.2 10/29/2017 1049   CREATININE 0.74 07/29/2018 0849   CREATININE 0.7 10/29/2017 1049   GLU 198 08/25/2017      Component Value Date/Time   CALCIUM 9.2 07/29/2018 0849   CALCIUM 9.0 10/29/2017 1049   ALKPHOS 80 07/29/2018 0849   ALKPHOS 82 10/29/2017 1049   AST 13 (L) 07/29/2018 0849   AST 10  10/29/2017 1049   ALT 9 07/29/2018 0849   ALT 8 10/29/2017 1049   BILITOT 0.3 07/29/2018 0849   BILITOT 0.22 10/29/2017 1049       RADIOGRAPHIC STUDIES: Ct Soft Tissue Neck W Contrast  Result Date: 07/27/2018 CLINICAL DATA:  52 year old male with treated non-small cell lung cancer, epiglottis carcinoma, left supraclavicular lymphadenopathy. 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 Restaging. EXAM: CT NECK WITH CONTRAST TECHNIQUE: Multidetector CT imaging of the neck was performed using the standard protocol following the bolus administration of intravenous contrast. CONTRAST:  34mL OMNIPAQUE IOHEXOL 300 MG/ML SOLN in conjunction with contrast enhanced imaging of the chest  reported separately. COMPARISON:  Neck CT 05/25/2018 and earlier. FINDINGS: Pharynx and larynx: New hyperdense vocal cord implants (series 2, image 85). Otherwise stable and negative appearance of the glottis. Stable supraglottic laryngeal soft tissue thickening compatible with prior radiation. No discrete supraglottic mass or masslike enhancement. Pharyngeal soft tissue contours are stable and within normal limits. Parapharyngeal and retropharyngeal spaces are within normal limits. Salivary glands: Negative sublingual space. Stable submandibular and parotid glands. Mild post treatment stranding in the submandibular spaces and anterior neck deep to the platysma. Thyroid: Negative. Lymph nodes: Resolved left supraclavicular lymphadenopathy since 2018 (series 2, image 95). Small left level 3 nodes are stable and within normal limits. No new or increased bilateral cervical lymph nodes. Vascular: Major vascular structures in the neck and at the skull base are patent. Right IJ approach porta cath remains in place. Limited intracranial: Negative. Visualized orbits: Negative. Mastoids and visualized paranasal sinuses: Mild bilateral ethmoid sinus mucosal thickening today. Small chronic left maxillary retention cyst. Increased mild alveolar recess mucosal thickening. Previous left mastoidectomy with stable pneumatized resection cavity. Right tympanic cavity and mastoids remain clear. Skeleton: No acute osseous abnormality identified. Upper chest: Reported separately today. IMPRESSION: 1. 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 Otherwise stable and satisfactory post treatment appearance of the neck; NI-RADS category 1. 2.  CT Chest today reported separately. Electronically Signed   By: Genevie Ann M.D.   On: 07/27/2018 09:48   Ct Chest W Contrast  Result Date: 07/27/2018 CLINICAL DATA:  Left lung cancer, chemotherapy/XRT complete EXAM: CT CHEST WITH CONTRAST TECHNIQUE: Multidetector CT imaging of the chest was  performed during intravenous contrast administration. CONTRAST:  51mL OMNIPAQUE IOHEXOL 300 MG/ML  SOLN COMPARISON:  05/25/2018 FINDINGS: Cardiovascular: Heart is normal in size.  No pericardial effusion. No evidence of thoracic aortic aneurysm. Mild atherosclerotic calcifications of the aortic arch. Right chest port terminates at the cavoatrial junction. Mediastinum/Nodes: 7 mm short axis subcarinal node. No suspicious mediastinal lymphadenopathy. Mild left perihilar soft tissue (series 3/image 33), extending into the AP window (series 3/image 29), stable versus mildly improved. This continues to favor treated tumor. Lungs/Pleura: Medial left upper lobe/perihilar radiation changes. No new/suspicious pulmonary nodules. No focal consolidation. Mild centrilobular and paraseptal emphysematous changes. Trace left pleural effusion. No pneumothorax. Upper Abdomen: Visualized upper abdomen is grossly unremarkable. Musculoskeletal: Mild degenerative changes of the visualized thoracolumbar spine. IMPRESSION: Radiation changes in the medial left upper lobe/perihilar region. Stable soft tissue in the left perihilar region/AP window, favoring treated tumor. No findings specific for recurrent or metastatic disease. Aortic Atherosclerosis (ICD10-I70.0) and Emphysema (ICD10-J43.9). Electronically Signed   By: Julian Hy M.D.   On: 07/27/2018 09:17    ASSESSMENT AND PLAN: This is a very pleasant 53 years old white male with recently diagnosed stage IIIB/IV non-small cell lung cancer, adenocarcinoma presented with large left  hilar mass in addition to mediastinal and left supraclavicular lymphadenopathy as well as suspicious right upper lobe pulmonary nodule diagnosed in August 2018. 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 He underwent a course of concurrent chemoradiation to the lung as well as the epiglottic area under the care of Dr. Tammi Klippel. He is status post 6 cycle. He  tolerated this course of treatment well except for the radiation induced esophagitis as well as weight loss and fatigue. The patient had partial response to the previous treatment. He is currently on treatment with Keytruda 200 mg IV every 3 weeks status post 15 cycles. He continues to tolerate this treatment well with no concerning adverse effects. He had repeat CT scan of the neck and chest performed recently.  I personally and independently reviewed the scans and discussed the results with the patient and his wife.  His scan showed no concerning findings for disease progression. I recommended for the patient to continue his current treatment with [facility] and he will proceed with cycle #16 today. I will see the patient back for follow-up visit in 3 weeks for evaluation before starting cycle #17. He was advised to call immediately if he has any concerning symptoms in the interval. The patient voices understanding of current disease status and treatment options and is in agreement with the current care plan. All questions were answered. The patient knows to call the clinic with any problems, questions or concerns. We can certainly see the patient much sooner if necessary. Disclaimer: This note was dictated with voice recognition software. Similar sounding words can inadvertently be transcribed and may not be corrected upon review.

## 2018-07-29 NOTE — Patient Instructions (Signed)
Steps to Quit Smoking Smoking tobacco can be bad for your health. It can also affect almost every organ in your body. Smoking puts you and people around you at risk for many serious long-lasting (chronic) diseases. Quitting smoking is hard, but it is one of the best things that you can do for your health. It is never too late to quit. What are the benefits of quitting smoking? When you quit smoking, you lower your risk for getting serious diseases and conditions. They can include:  Lung cancer or lung disease.  Heart disease.  Stroke.  Heart attack.  Not being able to have children (infertility).  Weak bones (osteoporosis) and broken bones (fractures).  If you have coughing, wheezing, and shortness of breath, those symptoms may get better when you quit. You may also get sick less often. If you are pregnant, quitting smoking can help to lower your chances of having a baby of low birth weight. What can I do to help me quit smoking? Talk with your doctor about what can help you quit smoking. Some things you can do (strategies) include:  Quitting smoking totally, instead of slowly cutting back how much you smoke over a period of time.  Going to in-person counseling. You are more likely to quit if you go to many counseling sessions.  Using resources and support systems, such as: ? Online chats with a counselor. ? Phone quitlines. ? Printed self-help materials. ? Support groups or group counseling. ? Text messaging programs. ? Mobile phone apps or applications.  Taking medicines. Some of these medicines may have nicotine in them. If you are pregnant or breastfeeding, do not take any medicines to quit smoking unless your doctor says it is okay. Talk with your doctor about counseling or other things that can help you.  Talk with your doctor about using more than one strategy at the same time, such as taking medicines while you are also going to in-person counseling. This can help make  quitting easier. What things can I do to make it easier to quit? Quitting smoking might feel very hard at first, but there is a lot that you can do to make it easier. Take these steps:  Talk to your family and friends. Ask them to support and encourage you.  Call phone quitlines, reach out to support groups, or work with a counselor.  Ask people who smoke to not smoke around you.  Avoid places that make you want (trigger) to smoke, such as: ? Bars. ? Parties. ? Smoke-break areas at work.  Spend time with people who do not smoke.  Lower the stress in your life. Stress can make you want to smoke. Try these things to help your stress: ? Getting regular exercise. ? Deep-breathing exercises. ? Yoga. ? Meditating. ? Doing a body scan. To do this, close your eyes, focus on one area of your body at a time from head to toe, and notice which parts of your body are tense. Try to relax the muscles in those areas.  Download or buy apps on your mobile phone or tablet that can help you stick to your quit plan. There are many free apps, such as QuitGuide from the CDC (Centers for Disease Control and Prevention). You can find more support from smokefree.gov and other websites.  This information is not intended to replace advice given to you by your health care provider. Make sure you discuss any questions you have with your health care provider. Document Released: 08/23/2009 Document   Revised: 06/24/2016 Document Reviewed: 03/13/2015 Elsevier Interactive Patient Education  2018 [employer]vier Inc.  

## 2018-07-29 NOTE — Telephone Encounter (Signed)
3 cycles already scheduled per 9/19 los - no additional appts added.

## 2018-08-19 ENCOUNTER — Inpatient Hospital Stay: Payer: 59 | Attending: Internal Medicine

## 2018-08-19 ENCOUNTER — Encounter: Payer: Self-pay | Admitting: Internal Medicine

## 2018-08-19 ENCOUNTER — Inpatient Hospital Stay: Payer: 59

## 2018-08-19 ENCOUNTER — Inpatient Hospital Stay (HOSPITAL_BASED_OUTPATIENT_CLINIC_OR_DEPARTMENT_OTHER): Payer: 59 | Admitting: Internal Medicine

## 2018-08-19 ENCOUNTER — Telehealth: Payer: Self-pay | Admitting: Internal Medicine

## 2018-08-19 VITALS — BP 126/84 | HR 87 | Temp 98.7°F | Resp 18 | Ht 71.0 in | Wt 158.6 lb

## 2018-08-19 DIAGNOSIS — Z79899 Other long term (current) drug therapy: Secondary | ICD-10-CM | POA: Insufficient documentation

## 2018-08-19 DIAGNOSIS — Z5112 Encounter for antineoplastic immunotherapy: Secondary | ICD-10-CM | POA: Insufficient documentation

## 2018-08-19 DIAGNOSIS — C321 Malignant neoplasm of supraglottis: Secondary | ICD-10-CM | POA: Diagnosis not present

## 2018-08-19 DIAGNOSIS — L299 Pruritus, unspecified: Secondary | ICD-10-CM | POA: Insufficient documentation

## 2018-08-19 DIAGNOSIS — J449 Chronic obstructive pulmonary disease, unspecified: Secondary | ICD-10-CM | POA: Insufficient documentation

## 2018-08-19 DIAGNOSIS — Z87891 Personal history of nicotine dependence: Secondary | ICD-10-CM | POA: Insufficient documentation

## 2018-08-19 DIAGNOSIS — C3412 Malignant neoplasm of upper lobe, left bronchus or lung: Secondary | ICD-10-CM

## 2018-08-19 DIAGNOSIS — Z923 Personal history of irradiation: Secondary | ICD-10-CM

## 2018-08-19 DIAGNOSIS — Y842 Radiological procedure and radiotherapy as the cause of abnormal reaction of the patient, or of later complication, without mention of misadventure at the time of the procedure: Secondary | ICD-10-CM | POA: Diagnosis not present

## 2018-08-19 DIAGNOSIS — K219 Gastro-esophageal reflux disease without esophagitis: Secondary | ICD-10-CM

## 2018-08-19 DIAGNOSIS — K208 Other esophagitis: Secondary | ICD-10-CM | POA: Insufficient documentation

## 2018-08-19 DIAGNOSIS — L509 Urticaria, unspecified: Secondary | ICD-10-CM | POA: Diagnosis not present

## 2018-08-19 DIAGNOSIS — I10 Essential (primary) hypertension: Secondary | ICD-10-CM | POA: Insufficient documentation

## 2018-08-19 DIAGNOSIS — R490 Dysphonia: Secondary | ICD-10-CM | POA: Diagnosis not present

## 2018-08-19 DIAGNOSIS — R634 Abnormal weight loss: Secondary | ICD-10-CM | POA: Diagnosis not present

## 2018-08-19 DIAGNOSIS — R5382 Chronic fatigue, unspecified: Secondary | ICD-10-CM

## 2018-08-19 DIAGNOSIS — Z95828 Presence of other vascular implants and grafts: Secondary | ICD-10-CM

## 2018-08-19 LAB — CBC WITH DIFFERENTIAL/PLATELET
Abs Immature Granulocytes: 0.01 10*3/uL (ref 0.00–0.07)
BASOS ABS: 0 10*3/uL (ref 0.0–0.1)
BASOS PCT: 1 %
EOS ABS: 0 10*3/uL (ref 0.0–0.5)
EOS PCT: 1 %
HEMATOCRIT: 46.3 % (ref 39.0–52.0)
Hemoglobin: 16.1 g/dL (ref 13.0–17.0)
Immature Granulocytes: 0 %
LYMPHS ABS: 0.3 10*3/uL — AB (ref 0.7–4.0)
Lymphocytes Relative: 9 %
MCH: 33.1 pg (ref 26.0–34.0)
MCHC: 34.8 g/dL (ref 30.0–36.0)
MCV: 95.1 fL (ref 80.0–100.0)
Monocytes Absolute: 0.4 10*3/uL (ref 0.1–1.0)
Monocytes Relative: 11 %
NEUTROS PCT: 78 %
Neutro Abs: 2.9 10*3/uL (ref 1.7–7.7)
PLATELETS: 195 10*3/uL (ref 150–400)
RBC: 4.87 MIL/uL (ref 4.22–5.81)
RDW: 11.6 % (ref 11.5–15.5)
WBC: 3.7 10*3/uL — AB (ref 4.0–10.5)
nRBC: 0 % (ref 0.0–0.2)

## 2018-08-19 LAB — COMPREHENSIVE METABOLIC PANEL
ALK PHOS: 76 U/L (ref 38–126)
ALT: 13 U/L (ref 0–44)
AST: 17 U/L (ref 15–41)
Albumin: 4 g/dL (ref 3.5–5.0)
Anion gap: 11 (ref 5–15)
BUN: 5 mg/dL — ABNORMAL LOW (ref 6–20)
CALCIUM: 9.3 mg/dL (ref 8.9–10.3)
CO2: 24 mmol/L (ref 22–32)
Chloride: 105 mmol/L (ref 98–111)
Creatinine, Ser: 0.69 mg/dL (ref 0.61–1.24)
GFR calc Af Amer: >60 mL/min (ref >60)
GFR calc non Af Amer: >60 mL/min (ref >60)
Glucose, Bld: 104 mg/dL — ABNORMAL HIGH (ref 70–99)
Potassium: 4.1 mmol/L (ref 3.5–5.1)
SODIUM: 140 mmol/L (ref 135–145)
TOTAL PROTEIN: 6.8 g/dL (ref 6.5–8.1)
Total Bilirubin: 0.5 mg/dL (ref 0.3–1.2)

## 2018-08-19 LAB — TSH: TSH: 1.077 u[IU]/mL (ref 0.320–4.118)

## 2018-08-19 MED ORDER — SODIUM CHLORIDE 0.9 % IV SOLN
200.0000 mg | Freq: Once | INTRAVENOUS | Status: AC
  Administered 2018-08-19: 200 mg via INTRAVENOUS
  Filled 2018-08-19: qty 8

## 2018-08-19 MED ORDER — SODIUM CHLORIDE 0.9 % IV SOLN
Freq: Once | INTRAVENOUS | Status: AC
  Administered 2018-08-19: 11:00:00 via INTRAVENOUS
  Filled 2018-08-19: qty 250

## 2018-08-19 MED ORDER — SODIUM CHLORIDE 0.9% FLUSH
10.0000 mL | Freq: Once | INTRAVENOUS | Status: AC
  Administered 2018-08-19: 10 mL
  Filled 2018-08-19: qty 10

## 2018-08-19 MED ORDER — SODIUM CHLORIDE 0.9% FLUSH
10.0000 mL | INTRAVENOUS | Status: DC | PRN
  Administered 2018-08-19: 10 mL
  Filled 2018-08-19: qty 10

## 2018-08-19 MED ORDER — HEPARIN SOD (PORK) LOCK FLUSH 100 UNIT/ML IV SOLN
500.0000 [IU] | Freq: Once | INTRAVENOUS | Status: AC | PRN
  Administered 2018-08-19: 500 [IU]
  Filled 2018-08-19: qty 5

## 2018-08-19 NOTE — Progress Notes (Signed)
[facility] [phone]   [phone]  OFFICE PROGRESS NOTE  [name], [name], MD [place] [address]  DIAGNOSIS:  1) Stage IIB/IV (T3, N3, M1a) non-small cell lung cancer, squamous cell carcinoma presented with large left hilar mass in addition to mediastinal and left supraclavicular lymphadenopathy as well as contralateral right upper lobe nodule diagnosed in August 2018. PDL 1 expression: 90%. 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 First dose 06/29/2017. Status post 6 cycles. Last dose was given 08/03/2017.  CURRENT THERAPY: Immunotherapy with Ketruda 200 MG IV every 3 weeks, first dose 09/17/2017.  Status post 16 cycles.  INTERVAL HISTORY: Trevor Zavala 52 y.o. male returns to the clinic today for follow-up visit.  The patient is feeling fine today with no specific complaints except for the persistent hoarseness of his voice.  He denied having any chest pain, shortness breath, cough or hemoptysis.  He denied having any fever or chills.  He has no nausea, vomiting, diarrhea or constipation.  He has no significant skin rash.  The patient is tolerating his treatment with Keytruda fairly well.  He is here for evaluation before starting cycle #17.  MEDICAL HISTORY: Past Medical History:  Diagnosis Date  . Anxiety   . COPD (chronic obstructive pulmonary disease) ([place])   . GERD (gastroesophageal reflux disease)   . Headache   . Hypertension   . Pneumonia   . Situational depression   . Stage III squamous cell carcinoma of left lung ([facility]) 06/19/2017   Lungs & Epiglottis  . Tubular adenoma of colon 01/2015  . Tubular adenoma of colon 01/2015    ALLERGIES:  is allergic to escitalopram oxalate.  MEDICATIONS:  Current Outpatient Medications  Medication Sig Dispense Refill  . acyclovir  (ZOVIRAX) 400 MG tablet Take 1 tablet (400 mg total) by mouth 3 (three) times daily. 21 tablet 3  . clonazePAM (KLONOPIN) 0.5 MG tablet Take 1-2 tablets (0.5-1 mg total) by mouth 2 (two) times daily as needed for anxiety. 180 tablet 2  . diphenhydrAMINE (BENADRYL) 25 MG tablet Take 12.5 mg by mouth every 6 (six) hours as needed for itching.    . docusate sodium (COLACE) 100 MG capsule Take 100 mg daily by mouth.    . esomeprazole (NEXIUM) 40 MG capsule 1 po qam 90 capsule 3  . fluticasone furoate-vilanterol (BREO ELLIPTA) 100-25 MCG/INH AEPB INHALE 1 PUFF INTO THE LUNGS DAILY. 1 each 11  . loratadine (CLARITIN) 10 MG tablet Take 10 mg by mouth daily as needed for allergies.    . Multiple Vitamin (MULTIVITAMIN WITH MINERALS) TABS tablet Take 1 tablet by mouth daily.    . ondansetron (ZOFRAN ODT) 4 MG disintegrating tablet Take 1 tablet (4 mg total) by mouth every 8 (eight) hours as needed for nausea or vomiting. (Patient not taking: Reported on 07/08/2018) 20 tablet 0  . pembrolizumab (KEYTRUDA) 100 MG/4ML SOLN Inject 2 mg/kg into the vein. 200mg  every 3 weeks.     No current facility-administered medications for this visit.     SURGICAL HISTORY:  Past Surgical History:  Procedure Laterality Date  . COLONOSCOPY    . DIRECT LARYNGOSCOPY N/A 06/25/2017   Procedure: DIRECT LARYNGOSCOPY AND BIOPSY;  Surgeon: Rozetta Nunnery, MD;  Location: [facility];  Service: ENT;  Laterality: N/A;  . EAR CYST EXCISION  N/A 11/17/2013   Procedure: SEBACEOUS CYST CHEST;  Surgeon: Joyice Faster. Cornett, MD;  Location: [facility];  Service: General;  Laterality: N/A;  . IR GASTROSTOMY TUBE MOD SED  08/10/2017  . IR GASTROSTOMY TUBE REMOVAL  09/29/2017  . KNEE ARTHROSCOPY     LEFT  . LIPOMA EXCISION N/A 11/17/2013   Procedure: EXCISION LIPOMA FOREHEAD;  Surgeon: Joyice Faster. Cornett, MD;  Location: [facility];  Service: General;  Laterality: N/A;  . LUNG BIOPSY Bilateral  06/12/2017   Procedure: LEFT LUNG BIOPSY;  Surgeon: Grace Isaac, MD;  Location: [facility];  Service: Thoracic;  Laterality: Bilateral;  . MICROLARYNGOSCOPY W/VOCAL CORD INJECTION Left 07/16/2018   Procedure: suspened micro laryngoscopy with jet ventilation and prolaryn injection;  Surgeon: Melida Quitter, MD;  Location: [facility];  Service: ENT;  Laterality: Left;  . PORTACATH PLACEMENT Right 07/01/2017   Procedure: INSERTION PORT-A-CATH - RIGHT IJ - placed with Fluoro and Ultrasound;  Surgeon: Grace Isaac, MD;  Location: [facility];  Service: Thoracic;  Laterality: Right;  Marland Kitchen VIDEO BRONCHOSCOPY WITH ENDOBRONCHIAL ULTRASOUND N/A 06/12/2017   Procedure: VIDEO BRONCHOSCOPY WITH ENDOBRONCHIAL ULTRASOUND;  Surgeon: Grace Isaac, MD;  Location: [facility];  Service: Thoracic;  Laterality: N/A;    REVIEW OF SYSTEMS:  A comprehensive review of systems was negative except for: Ears, nose, mouth, throat, and face: positive for hoarseness   PHYSICAL EXAMINATION: General appearance: alert, cooperative and no distress Head: Normocephalic, without obvious abnormality, atraumatic Neck: no adenopathy, no JVD, supple, symmetrical, trachea midline and thyroid not enlarged, symmetric, no tenderness/mass/nodules Lymph nodes: Cervical, supraclavicular, and axillary nodes normal. Resp: clear to auscultation bilaterally Back: symmetric, no curvature. ROM normal. No CVA tenderness. Cardio: regular rate and rhythm, S1, S2 normal, no murmur, click, rub or gallop GI: soft, non-tender; bowel sounds normal; no masses,  no organomegaly Extremities: extremities normal, atraumatic, no cyanosis or edema  ECOG PERFORMANCE STATUS: 1 - Symptomatic but completely ambulatory  Blood pressure 126/84, pulse 87, temperature 98.7 F (37.1 C), temperature source Oral, resp. rate 18, height 5\' 11"  (1.803 m), weight 158 lb 9.6 oz (71.9 kg), SpO2 94 %.  LABORATORY DATA: Lab Results  Component Value Date   WBC 3.7 (L) 08/19/2018   HGB  16.1 08/19/2018   HCT 46.3 08/19/2018   MCV 95.1 08/19/2018   PLT 195 08/19/2018      Chemistry      Component Value Date/Time   NA 143 07/29/2018 0849   NA 141 10/29/2017 1049   K 4.2 07/29/2018 0849   K 3.3 (L) 10/29/2017 1049   CL 108 07/29/2018 0849   CO2 27 07/29/2018 0849   CO2 26 10/29/2017 1049   BUN 8 07/29/2018 0849   BUN 11.2 10/29/2017 1049   CREATININE 0.74 07/29/2018 0849   CREATININE 0.7 10/29/2017 1049   GLU 198 08/25/2017      Component Value Date/Time   CALCIUM 9.2 07/29/2018 0849   CALCIUM 9.0 10/29/2017 1049   ALKPHOS 80 07/29/2018 0849   ALKPHOS 82 10/29/2017 1049   AST 13 (L) 07/29/2018 0849   AST 10 10/29/2017 1049   ALT 9 07/29/2018 0849   ALT 8 10/29/2017 1049   BILITOT 0.3 07/29/2018 0849   BILITOT 0.22 10/29/2017 1049       RADIOGRAPHIC STUDIES: Ct Soft Tissue Neck W Contrast  Result Date: 07/27/2018 CLINICAL DATA:  52 year old male with treated non-small cell lung cancer, epiglottis carcinoma, left supraclavicular lymphadenopathy. 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 Restaging. EXAM: CT NECK WITH CONTRAST TECHNIQUE: Multidetector CT imaging of the neck was performed using the standard protocol following the bolus administration of intravenous contrast. CONTRAST:  38mL OMNIPAQUE IOHEXOL 300 MG/ML SOLN in conjunction with contrast enhanced imaging of the chest reported separately. COMPARISON:  Neck CT 05/25/2018 and earlier. FINDINGS: Pharynx and larynx: New hyperdense vocal cord implants (series 2, image 85). Otherwise stable and negative appearance of the glottis. Stable supraglottic laryngeal soft tissue thickening compatible with prior radiation. No discrete supraglottic mass or masslike enhancement. Pharyngeal soft tissue contours are stable and within normal limits. Parapharyngeal and retropharyngeal spaces are within normal limits. Salivary glands: Negative sublingual space. Stable submandibular and  parotid glands. Mild post treatment stranding in the submandibular spaces and anterior neck deep to the platysma. Thyroid: Negative. Lymph nodes: Resolved left supraclavicular lymphadenopathy since 2018 (series 2, image 95). Small left level 3 nodes are stable and within normal limits. No new or increased bilateral cervical lymph nodes. Vascular: Major vascular structures in the neck and at the skull base are patent. Right IJ approach porta cath remains in place. Limited intracranial: Negative. Visualized orbits: Negative. Mastoids and visualized paranasal sinuses: Mild bilateral ethmoid sinus mucosal thickening today. Small chronic left maxillary retention cyst. Increased mild alveolar recess mucosal thickening. Previous left mastoidectomy with stable pneumatized resection cavity. Right tympanic cavity and mastoids remain clear. Skeleton: No acute osseous abnormality identified. Upper chest: Reported separately today. IMPRESSION: 1. 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 Otherwise stable and satisfactory post treatment appearance of the neck; NI-RADS category 1. 2.  CT Chest today reported separately. Electronically Signed   By: Genevie Ann M.D.   On: 07/27/2018 09:48   Ct Chest W Contrast  Result Date: 07/27/2018 CLINICAL DATA:  Left lung cancer, chemotherapy/XRT complete EXAM: CT CHEST WITH CONTRAST TECHNIQUE: Multidetector CT imaging of the chest was performed during intravenous contrast administration. CONTRAST:  31mL OMNIPAQUE IOHEXOL 300 MG/ML  SOLN COMPARISON:  05/25/2018 FINDINGS: Cardiovascular: Heart is normal in size.  No pericardial effusion. No evidence of thoracic aortic aneurysm. Mild atherosclerotic calcifications of the aortic arch. Right chest port terminates at the cavoatrial junction. Mediastinum/Nodes: 7 mm short axis subcarinal node. No suspicious mediastinal lymphadenopathy. Mild left perihilar soft tissue (series 3/image 33), extending into the AP window (series 3/image 29),  stable versus mildly improved. This continues to favor treated tumor. Lungs/Pleura: Medial left upper lobe/perihilar radiation changes. No new/suspicious pulmonary nodules. No focal consolidation. Mild centrilobular and paraseptal emphysematous changes. Trace left pleural effusion. No pneumothorax. Upper Abdomen: Visualized upper abdomen is grossly unremarkable. Musculoskeletal: Mild degenerative changes of the visualized thoracolumbar spine. IMPRESSION: Radiation changes in the medial left upper lobe/perihilar region. Stable soft tissue in the left perihilar region/AP window, favoring treated tumor. No findings specific for recurrent or metastatic disease. Aortic Atherosclerosis (ICD10-I70.0) and Emphysema (ICD10-J43.9). Electronically Signed   By: Julian Hy M.D.   On: 07/27/2018 09:17    ASSESSMENT AND PLAN: This is a very pleasant 52 years old white male with recently diagnosed stage IIIB/IV non-small cell lung cancer, adenocarcinoma presented with large left hilar mass in addition to mediastinal and left supraclavicular lymphadenopathy as well as suspicious right upper lobe pulmonary nodule diagnosed in August 2018. 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 He underwent a course of concurrent chemoradiation to the lung as well as the epiglottic area under the care of Dr. Tammi Klippel. He is status post 6 cycle. He  tolerated this course of treatment well except for the radiation induced esophagitis as well as weight loss and fatigue. The patient had partial response to the previous treatment. He is currently on treatment with Keytruda 200 mg IV every 3 weeks status post 16 cycles. The patient continues to tolerate this treatment well with no concerning adverse effects. I recommended for him to proceed with cycle #17 today as scheduled. I will see him back for follow-up visit in 3 weeks for evaluation before starting cycle #18. The patient was advised to  call immediately if he has any concerning symptoms in the interval. The patient voices understanding of current disease status and treatment options and is in agreement with the current care plan. All questions were answered. The patient knows to call the clinic with any problems, questions or concerns. We can certainly see the patient much sooner if necessary. Disclaimer: This note was dictated with voice recognition software. Similar sounding words can inadvertently be transcribed and may not be corrected upon review.

## 2018-08-19 NOTE — Telephone Encounter (Signed)
Scheduled appt per 10/10 los - added 3rd cycles of treatments - pt to get an updated schedule next visit.

## 2018-08-19 NOTE — Patient Instructions (Signed)
[facility] Discharge Instructions for Patients Receiving Chemotherapy  Today you received the following chemotherapy agents:  Keytruda.  To help prevent nausea and vomiting after your treatment, we encourage you to take your nausea medication as directed.   If you develop nausea and vomiting that is not controlled by your nausea medication, call the clinic.   BELOW ARE SYMPTOMS THAT SHOULD BE REPORTED IMMEDIATELY:  *FEVER GREATER THAN 100.5 F  *CHILLS WITH OR WITHOUT FEVER  NAUSEA AND VOMITING THAT IS NOT CONTROLLED WITH YOUR NAUSEA MEDICATION  *UNUSUAL SHORTNESS OF BREATH  *UNUSUAL BRUISING OR BLEEDING  TENDERNESS IN MOUTH AND THROAT WITH OR WITHOUT PRESENCE OF ULCERS  *URINARY PROBLEMS  *BOWEL PROBLEMS  UNUSUAL RASH Items with * indicate a potential emergency and should be followed up as soon as possible.  Feel free to call the clinic should you have any questions or concerns. The clinic phone number is [phone].  Please show the CHEMO ALERT CARD at check-in to the Emergency Department and triage nurse.    

## 2018-08-24 ENCOUNTER — Encounter: Payer: Self-pay | Admitting: Gastroenterology

## 2018-08-24 ENCOUNTER — Ambulatory Visit (INDEPENDENT_AMBULATORY_CARE_PROVIDER_SITE_OTHER): Payer: Managed Care, Other (non HMO) | Admitting: Gastroenterology

## 2018-08-24 ENCOUNTER — Telehealth: Payer: Self-pay

## 2018-08-24 VITALS — BP 108/78 | HR 97 | Ht 71.0 in | Wt 162.0 lb

## 2018-08-24 DIAGNOSIS — K219 Gastro-esophageal reflux disease without esophagitis: Secondary | ICD-10-CM

## 2018-08-24 DIAGNOSIS — Z86010 Personal history of colonic polyps: Secondary | ICD-10-CM

## 2018-08-24 MED ORDER — NA SULFATE-K SULFATE-MG SULF 17.5-3.13-1.6 GM/177ML PO SOLN: 1.0000 | Freq: Once | ORAL | 0 refills | Status: AC

## 2018-08-24 NOTE — Progress Notes (Signed)
History of Present Illness: This is a 52 year old male referred by [name], [name], MD for the evaluation of a personal history of adenomatous colon polyps.  Last colonoscopy performed in March 2016 showed 5 adenomatous colon polyps and one was 1.1 cm.  3-year interval surveillance was recommended.  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 He is under active treatment with Keytruda.  He states his symptoms have improved and he has maintained his weight.  He has chronic hoarseness.  No gastrointestinal complaints.  He would like to proceed with colonoscopy for surveillance. Denies weight loss, abdominal pain, constipation, diarrhea, change in stool caliber, melena, hematochezia, nausea, vomiting, dysphagia, reflux symptoms, chest pain.    Allergies  Allergen Reactions  . Escitalopram Oxalate     "Bad thoughts" "Bad thoughts"   Outpatient Medications Prior to Visit  Medication Sig Dispense Refill  . acyclovir (ZOVIRAX) 400 MG tablet Take 1 tablet (400 mg total) by mouth 3 (three) times daily. (Patient taking differently: Take 400 mg by mouth 3 (three) times daily as needed. ) 21 tablet 3  . clonazePAM (KLONOPIN) 0.5 MG tablet Take 1-2 tablets (0.5-1 mg total) by mouth 2 (two) times daily as needed for anxiety. 180 tablet 2  . diphenhydrAMINE (BENADRYL) 25 MG tablet Take 12.5 mg by mouth every 6 (six) hours as needed for itching.    . docusate sodium (COLACE) 100 MG capsule Take 100 mg by mouth daily as needed.     Marland Kitchen esomeprazole (NEXIUM) 40 MG capsule 1 po qam 90 capsule 3  . fluticasone furoate-vilanterol (BREO ELLIPTA) 100-25 MCG/INH AEPB INHALE 1 PUFF INTO THE LUNGS DAILY. 1 each 11  . loratadine (CLARITIN) 10 MG tablet Take 10 mg by mouth daily as needed for allergies.    . Multiple Vitamin (MULTIVITAMIN WITH MINERALS) TABS tablet Take 1 tablet by mouth daily.    . pembrolizumab (KEYTRUDA) 100 MG/4ML SOLN  Inject 2 mg/kg into the vein. 200mg  every 3 weeks.    . ondansetron (ZOFRAN ODT) 4 MG disintegrating tablet Take 1 tablet (4 mg total) by mouth every 8 (eight) hours as needed for nausea or vomiting. (Patient not taking: Reported on 07/08/2018) 20 tablet 0   No facility-administered medications prior to visit.    Past Medical History:  Diagnosis Date  . Anxiety   . COPD (chronic obstructive pulmonary disease) ([facility])   . GERD (gastroesophageal reflux disease)   . Headache   . Hypertension   . Pneumonia   . Situational depression   . Stage III squamous cell carcinoma of left lung ([facility]) 06/19/2017   Lungs & Epiglottis  . Tubular adenoma of colon 01/2015  . Tubular adenoma of colon 01/2015   Past Surgical History:  Procedure Laterality Date  . COLONOSCOPY    . DIRECT LARYNGOSCOPY N/A 06/25/2017   Procedure: DIRECT LARYNGOSCOPY AND BIOPSY;  Surgeon: Rozetta Nunnery, MD;  Location: [facility];  Service: ENT;  Laterality: N/A;  . EAR CYST EXCISION N/A 11/17/2013   Procedure: SEBACEOUS CYST CHEST;  Surgeon: Joyice Faster. Cornett, MD;  Location: [facility];  Service: General;  Laterality: N/A;  . IR GASTROSTOMY TUBE MOD SED  08/10/2017  . IR GASTROSTOMY TUBE REMOVAL  09/29/2017  . KNEE ARTHROSCOPY     LEFT  . LIPOMA EXCISION N/A 11/17/2013   Procedure: EXCISION LIPOMA FOREHEAD;  Surgeon: Joyice Faster. Cornett, MD;  Location: [facility];  Service: General;  Laterality: N/A;  . LUNG BIOPSY Bilateral 06/12/2017   Procedure: LEFT LUNG BIOPSY;  Surgeon: Grace Isaac, MD;  Location: [facility];  Service: Thoracic;  Laterality: Bilateral;  . MICROLARYNGOSCOPY W/VOCAL CORD INJECTION Left 07/16/2018   Procedure: suspened micro laryngoscopy with jet ventilation and prolaryn injection;  Surgeon: Melida Quitter, MD;  Location: [facility];  Service: ENT;  Laterality: Left;  . PORTACATH PLACEMENT Right 07/01/2017   Procedure: INSERTION PORT-A-CATH - RIGHT IJ - placed with Fluoro  and Ultrasound;  Surgeon: Grace Isaac, MD;  Location: [facility];  Service: Thoracic;  Laterality: Right;  Marland Kitchen VIDEO BRONCHOSCOPY WITH ENDOBRONCHIAL ULTRASOUND N/A 06/12/2017   Procedure: VIDEO BRONCHOSCOPY WITH ENDOBRONCHIAL ULTRASOUND;  Surgeon: Grace Isaac, MD;  Location: [facility];  Service: Thoracic;  Laterality: N/A;   Social History   Socioeconomic History  . Marital status: Married    Spouse name: Not on file  . Number of children: Not on file  . Years of education: Not on file  . Highest education level: Not on file  Occupational History  . Not on file  Social Needs  . Financial resource strain: Not on file  . Food insecurity:    Worry: Not on file    Inability: Not on file  . Transportation needs:    Medical: Not on file    Non-medical: Not on file  Tobacco Use  . Smoking status: Former Smoker    Packs/day: 0.25    Years: 35.00    Pack years: 8.75    Last attempt to quit: 08/10/2017    Years since quitting: 1.0  . Smokeless tobacco: Never Used  . Tobacco comment: down to 4 cig/day01/08/2018   Substance and Sexual Activity  . Alcohol use: Yes    Alcohol/week: 0.0 standard drinks    Comment: SOCIAL  . Drug use: No  . Sexual activity: Yes  Lifestyle  . Physical activity:    Days per week: Not on file    Minutes per session: Not on file  . Stress: Not on file  Relationships  . Social connections:    Talks on phone: Not on file    Gets together: Not on file    Attends religious service: Not on file    Active member of club or organization: Not on file    Attends meetings of clubs or organizations: Not on file    Relationship status: Not on file  Other Topics Concern  . Not on file  Social History Narrative   Daily Caffeine Use:  3   Family History  Problem Relation Age of Onset  . Breast cancer Mother   . Hypertension Mother   . Colon cancer Mother   . Skin cancer Father   . Hypertension Father   . Colon polyps Father   . Cancer Father 74       b  cell lymphoma  . Colon cancer Father       Review of Systems: Pertinent positive and negative review of systems were noted in the above HPI section. All other review of systems were otherwise negative.    Physical Exam: General: Well developed, well nourished, no acute distress Head: Normocephalic and atraumatic Eyes:  sclerae anicteric, EOMI Ears: Normal auditory acuity Mouth: No deformity or lesions Neck: Supple, no masses or thyromegaly Lungs: Clear throughout to auscultation Heart: Regular rate and rhythm; no murmurs, rubs or bruits Abdomen: Soft, non tender and non distended. No masses, hepatosplenomegaly or hernias  noted. Normal Bowel sounds Rectal: Deferred to colonoscopy Musculoskeletal: Symmetrical with no gross deformities  Skin: No lesions on visible extremities Pulses:  Normal pulses noted Extremities: No clubbing, cyanosis, edema or deformities noted Neurological: Alert oriented x 4, grossly nonfocal Cervical Nodes:  No significant cervical adenopathy Inguinal Nodes: No significant inguinal adenopathy Psychological:  Alert and cooperative. Normal mood and affect   Assessment and Recommendations:  1. Personal history of adenomatous colon polyps.  3-year interval surveillance colonoscopy is overdue.  We discussed the option of deferring surveillance.  He states he is improving on Keytruda, wants to proceed with surveillance colonoscopy and that [place] therapy is planned for about 1 more year.  The risks (including bleeding, perforation, infection, missed lesions, medication reactions and possible hospitalization or surgery if complications occur), benefits, and alternatives to colonoscopy with possible biopsy and possible polypectomy were discussed with the patient and they consent to proceed.   2. 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  I do not feel there is a contraindication to proceeding with  endoscopic procedures while maintaining on Keytruda every 3 weeks.  Will confirm with his oncologist.  3.  GERD.  Well-controlled.  Continue Nexium 40 mg p.o. every morning and follow standard antireflux measures.   cc: [name], [name], MD [address] [place], [place] [address]

## 2018-08-24 NOTE — Patient Instructions (Signed)
You have been scheduled for a colonoscopy. Please follow written instructions given to you at your visit today.  Please pick up your prep supplies at the pharmacy within the next 1-3 days. If you use inhalers (even only as needed), please bring them with you on the day of your procedure. Your physician has requested that you go to www.startemmi.com and enter the access code given to you at your visit today. This web site gives a general overview about your procedure. However, you should still follow specific instructions given to you by our office regarding your preparation for the procedure.  Normal BMI (Body Mass Index- based on height and weight) is between 19 and 25. Your BMI today is Body mass index is 22.59 kg/m. Marland Kitchen Please consider follow up  regarding your BMI with your Primary Care Provider.  Thank you for choosing me and [facility].  Pricilla Riffle. Dagoberto Ligas., MD., [facility]

## 2018-08-24 NOTE — Telephone Encounter (Signed)
   SHERMAINE BRIGHAM Jul 05, 1966 [id number]  Dear Dr. Julien Nordmann:  We have scheduled the above named patient for a(n) Colonoscopy procedure. Our records show that (s)he is on biologic therapy.  Please advise as to whether the patient may proceed with the Colonoscopy and if their is any contraindications while getting injections for Keytruda.  Please route your response to Marlon Pel, CMA or fax response to [phone].  Sincerely,    [facility]

## 2018-08-24 NOTE — Telephone Encounter (Signed)
Ok to proceed with colonoscopy  

## 2018-08-25 NOTE — Telephone Encounter (Signed)
Patient returned my call and left a message. Called patient back and had to leave a message for patient to return my call.

## 2018-08-25 NOTE — Telephone Encounter (Addendum)
Left a message for patient to call back. 

## 2018-08-26 NOTE — Telephone Encounter (Signed)
Left message for patient to return my call.

## 2018-08-30 NOTE — Telephone Encounter (Signed)
Left a message for patient to return my call. 

## 2018-08-31 NOTE — Telephone Encounter (Signed)
Closing chart

## 2018-08-31 NOTE — Telephone Encounter (Signed)
Left a message for patient to return my call. 

## 2018-08-31 NOTE — Telephone Encounter (Signed)
Informed patient of Dr. Worthy Flank recommendations to proceed with Colonoscopy and patient verbalized understanding.

## 2018-09-01 ENCOUNTER — Telehealth: Payer: Self-pay | Admitting: Medical

## 2018-09-01 ENCOUNTER — Inpatient Hospital Stay (HOSPITAL_BASED_OUTPATIENT_CLINIC_OR_DEPARTMENT_OTHER): Payer: 59 | Admitting: Medical

## 2018-09-01 VITALS — BP 137/84 | HR 91 | Temp 98.8°F | Resp 20 | Ht 71.0 in | Wt 164.0 lb

## 2018-09-01 DIAGNOSIS — Z5112 Encounter for antineoplastic immunotherapy: Secondary | ICD-10-CM | POA: Diagnosis not present

## 2018-09-01 DIAGNOSIS — Z79899 Other long term (current) drug therapy: Secondary | ICD-10-CM

## 2018-09-01 DIAGNOSIS — L509 Urticaria, unspecified: Secondary | ICD-10-CM

## 2018-09-01 DIAGNOSIS — Z923 Personal history of irradiation: Secondary | ICD-10-CM

## 2018-09-01 DIAGNOSIS — C3412 Malignant neoplasm of upper lobe, left bronchus or lung: Secondary | ICD-10-CM

## 2018-09-01 DIAGNOSIS — C321 Malignant neoplasm of supraglottis: Secondary | ICD-10-CM

## 2018-09-01 DIAGNOSIS — K219 Gastro-esophageal reflux disease without esophagitis: Secondary | ICD-10-CM

## 2018-09-01 DIAGNOSIS — I10 Essential (primary) hypertension: Secondary | ICD-10-CM

## 2018-09-01 DIAGNOSIS — Z87891 Personal history of nicotine dependence: Secondary | ICD-10-CM

## 2018-09-01 DIAGNOSIS — J449 Chronic obstructive pulmonary disease, unspecified: Secondary | ICD-10-CM

## 2018-09-01 MED ORDER — CETIRIZINE HCL 10 MG PO TABS: 10.0000 mg | ORAL_TABLET | Freq: Every day | ORAL | 5 refills | Status: DC

## 2018-09-01 MED ORDER — HYDROXYZINE HCL 50 MG PO TABS: 50.0000 mg | ORAL_TABLET | Freq: Every evening | ORAL | Status: DC | PRN

## 2018-09-01 MED ORDER — HYDROXYZINE PAMOATE 25 MG PO CAPS: 100.0000 mg | ORAL_CAPSULE | Freq: Every evening | ORAL | 5 refills | Status: DC | PRN

## 2018-09-01 MED ORDER — FEXOFENADINE HCL 60 MG PO TABS: 60.0000 mg | ORAL_TABLET | Freq: Two times a day (BID) | ORAL | 5 refills | Status: DC

## 2018-09-01 NOTE — Patient Instructions (Signed)

## 2018-09-01 NOTE — Telephone Encounter (Signed)
appt already scheduled per 10/22 sch message - called to verify appt with patient - pt is aware of appt date and time

## 2018-09-06 NOTE — Progress Notes (Signed)
[facility] Progress Note   Trevor Zavala [id number] December 12, 1965 52 y.o.  Trevor Zavala is managed by Dr. Fanny Bien. 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  He believes that this rash is due to [facility]  He has been taking Benadryl episodically without improvement.  He was told to begin Zyrtec 10 mg once daily, Allegra 60 mg twice daily, and was given a prescription for Atarax 25 mg to use at bedtime.  Stage III squamous cell carcinoma of the left lung: Mr. Tarbet continues on [place] under the direction of Dr. Julien Nordmann K. Trevor Zavala. The patient will return to see Dr. Julien Nordmann on 09/09/2018.  Please see After Visit Summary for patient specific instructions.  Future Appointments  Date Time Provider [place]  09/09/2018  8:00 AM [facility] LAB 1 [facility] None  09/09/2018  8:15 AM [facility] [place] FLUSH [facility] None  09/09/2018  8:30 AM Curt Bears, MD [facility] None  09/09/2018  9:45 AM [facility] INFUSION [facility] None  09/30/2018 10:15 AM [facility] LAB 1 [facility] None  09/30/2018 10:30 AM [facility] [place] FLUSH [facility] None  09/30/2018 11:00 AM Curcio, Kristin R, NP [facility] None  09/30/2018 11:45 AM [facility] INFUSION [facility] None  10/21/2018  8:15 AM [facility] LAB 1 [facility] None  10/21/2018  8:30 AM [facility] [place] FLUSH [facility] None  10/21/2018  9:00 AM Curcio, Roselie Awkward, NP [facility] None  10/21/2018 10:00 AM [facility] INFUSION [facility] None  10/28/2018  8:30 AM Ladene Artist, MD [facility]  12/01/2018 10:30 AM Bruning, Ashlyn, PA-C [facility] None    No orders of the defined types were placed in this encounter.      Subjective:   Patient ID:  Trevor Zavala is a 52 y.o. (DOB 06-May-1966) male.  Chief Complaint:  Chief Complaint  Patient presents with  . Rash    HPI Trevor Zavala is a 52 year old male with a history of a non-small cell lung cancer, squamous cell carcinoma who is managed by Dr. Parthenia Ames. Trevor Zavala.  He initially presented with a large left hilar mass in addition to mediastinal and left supraclavicular lymphadenopathy as well as a contralateral right upper lung nodule when he was diagnosed in August 2018.  He is currently treated with immunotherapy and receives Keytruda every 3 weeks.  He was first dosed on 09/17/2017.  He most recently completed cycle 17 of therapy on 08/19/2018.  He presents to the clinic today with a report of an erythematous and pruritic rash over his face, neck, upper chest, and legs.  He denies any difficulty swallowing, shortness of breath, or chest pains.  Medications: I have reviewed the patient's current medications.  Allergies:  Allergies  Allergen Reactions  . Escitalopram Oxalate     "Bad thoughts" "Bad thoughts"    Past Medical History:  Diagnosis Date  . Anxiety   . COPD (chronic obstructive pulmonary disease) ([place])   . GERD (gastroesophageal reflux disease)   . Headache   . Hypertension   . Pneumonia   . Situational depression   . Stage III squamous cell carcinoma of left lung ([place]) 06/19/2017  Lungs & Epiglottis  . Tubular adenoma of colon 01/2015  . Tubular adenoma of colon 01/2015    Past Surgical History:  Procedure Laterality Date  . COLONOSCOPY    . DIRECT LARYNGOSCOPY N/A 06/25/2017   Procedure: DIRECT LARYNGOSCOPY AND BIOPSY;  Surgeon: Rozetta Nunnery, MD;  Location: [place];  Service: ENT;  Laterality: N/A;  . EAR CYST EXCISION N/A 11/17/2013    Procedure: SEBACEOUS CYST CHEST;  Surgeon: Joyice Faster. Cornett, MD;  Location: [place];  Service: General;  Laterality: N/A;  . IR GASTROSTOMY TUBE MOD SED  08/10/2017  . IR GASTROSTOMY TUBE REMOVAL  09/29/2017  . KNEE ARTHROSCOPY     LEFT  . LIPOMA EXCISION N/A 11/17/2013   Procedure: EXCISION LIPOMA FOREHEAD;  Surgeon: Joyice Faster. Cornett, MD;  Location: [place];  Service: General;  Laterality: N/A;  . LUNG BIOPSY Bilateral 06/12/2017   Procedure: LEFT LUNG BIOPSY;  Surgeon: Grace Isaac, MD;  Location: [place];  Service: Thoracic;  Laterality: Bilateral;  . MICROLARYNGOSCOPY W/VOCAL CORD INJECTION Left 07/16/2018   Procedure: suspened micro laryngoscopy with jet ventilation and prolaryn injection;  Surgeon: Melida Quitter, MD;  Location: [place];  Service: ENT;  Laterality: Left;  . PORTACATH PLACEMENT Right 07/01/2017   Procedure: INSERTION PORT-A-CATH - RIGHT IJ - placed with Fluoro and Ultrasound;  Surgeon: Grace Isaac, MD;  Location: [place];  Service: Thoracic;  Laterality: Right;  Marland Kitchen VIDEO BRONCHOSCOPY WITH ENDOBRONCHIAL ULTRASOUND N/A 06/12/2017   Procedure: VIDEO BRONCHOSCOPY WITH ENDOBRONCHIAL ULTRASOUND;  Surgeon: Grace Isaac, MD;  Location: [facility];  Service: Thoracic;  Laterality: N/A;    Family History  Problem Relation Age of Onset  . Breast cancer Mother   . Hypertension Mother   . Colon cancer Mother   . Skin cancer Father   . Hypertension Father   . Colon polyps Father   . Cancer Father 37       b cell lymphoma  . Colon cancer Father     Social History   Socioeconomic History  . Marital status: Married    Spouse name: Not on file  . Number of children: Not on file  . Years of education: Not on file  . Highest education level: Not on file  Occupational History  . Not on file  Social Needs  . Financial resource strain: Not on file  . Food insecurity:    Worry: Not on file    Inability: Not on file  . Transportation needs:      Medical: Not on file    Non-medical: Not on file  Tobacco Use  . Smoking status: Former Smoker    Packs/day: 0.25    Years: 35.00    Pack years: 8.75    Last attempt to quit: 08/10/2017    Years since quitting: 1.0  . Smokeless tobacco: Never Used  . Tobacco comment: down to 4 cig/day01/08/2018   Substance and Sexual Activity  . Alcohol use: Yes    Alcohol/week: 0.0 standard drinks    Comment: SOCIAL  . Drug use: No  . Sexual activity: Yes  Lifestyle  . Physical activity:    Days per week: Not on file    Minutes per session: Not on file  . Stress: Not on file  Relationships  . Social connections:    Talks on phone: Not on file    Gets together: Not on file    Attends religious service: Not on file  Active member of club or organization: Not on file    Attends meetings of clubs or organizations: Not on file    Relationship status: Not on file  . Intimate partner violence:    Fear of current or ex partner: Not on file    Emotionally abused: Not on file    Physically abused: Not on file    Forced sexual activity: Not on file  Other Topics Concern  . Not on file  Social History Narrative   Daily Caffeine Use:  3    Past Medical History, Surgical history, Social history, and Family history were reviewed and updated as appropriate.   Please see review of systems for further details on the patient's review from today.   Review of Systems:  Review of Systems  Constitutional: Negative for chills, diaphoresis and fever.  HENT: Negative for facial swelling and trouble swallowing.   Respiratory: Negative for cough, chest tightness and shortness of breath.   Cardiovascular: Negative for chest pain.  Skin: Positive for rash.    Objective:   Physical Exam:  BP 137/84 (BP Location: Left Arm, Patient Position: Sitting)   Pulse 91   Temp 98.8 F (37.1 C) (Oral)   Resp 20   Ht 5\' 11"  (1.803 m)   Wt 164 lb (74.4 kg)   SpO2 97%   BMI 22.87 kg/m  ECOG: 0  Physical  Exam  Constitutional: No distress.  HENT:  Head: Normocephalic and atraumatic.  Mouth/Throat: Oropharynx is clear and moist.  Cardiovascular: Normal rate, regular rhythm, normal heart sounds and intact distal pulses. Exam reveals no gallop and no friction rub.  No murmur heard. Pulmonary/Chest: Effort normal and breath sounds normal. No stridor. No respiratory distress. He has no wheezes. He has no rales.  Abdominal: Soft. Bowel sounds are normal. He exhibits no distension and no mass. There is no tenderness. There is no rebound and no guarding.  Musculoskeletal: He exhibits no edema or deformity.  Neurological: He is alert. Coordination normal.  Skin: Skin is warm and dry. Rash noted. He is not diaphoretic. There is erythema.  The patient has erythema over his neck, face, and upper chest.  There appears to be a subtle macular rash in this area.  Psychiatric: He has a normal mood and affect. His behavior is normal. Judgment and thought content normal.    Lab Review:     Component Value Date/Time   NA 140 08/19/2018 1010   NA 141 10/29/2017 1049   K 4.1 08/19/2018 1010   K 3.3 (L) 10/29/2017 1049   CL 105 08/19/2018 1010   CO2 24 08/19/2018 1010   CO2 26 10/29/2017 1049   GLUCOSE 104 (H) 08/19/2018 1010   GLUCOSE 146 (H) 10/29/2017 1049   BUN 5 (L) 08/19/2018 1010   BUN 11.2 10/29/2017 1049   CREATININE 0.69 08/19/2018 1010   CREATININE 0.7 10/29/2017 1049   CALCIUM 9.3 08/19/2018 1010   CALCIUM 9.0 10/29/2017 1049   PROT 6.8 08/19/2018 1010   PROT 5.9 (L) 10/29/2017 1049   ALBUMIN 4.0 08/19/2018 1010   ALBUMIN 3.5 10/29/2017 1049   AST 17 08/19/2018 1010   AST 10 10/29/2017 1049   ALT 13 08/19/2018 1010   ALT 8 10/29/2017 1049   ALKPHOS 76 08/19/2018 1010   ALKPHOS 82 10/29/2017 1049   BILITOT 0.5 08/19/2018 1010   BILITOT 0.22 10/29/2017 1049   GFRNONAA >60 08/19/2018 1010   GFRAA >60 08/19/2018 1010       Component Value  Date/Time   WBC 3.7 (L) 08/19/2018 1010    RBC 4.87 08/19/2018 1010   HGB 16.1 08/19/2018 1010   HGB 13.5 10/29/2017 1049   HCT 46.3 08/19/2018 1010   HCT 40.4 10/29/2017 1049   PLT 195 08/19/2018 1010   PLT 226 10/29/2017 1049   MCV 95.1 08/19/2018 1010   MCV 92.6 10/29/2017 1049   MCH 33.1 08/19/2018 1010   MCHC 34.8 08/19/2018 1010   RDW 11.6 08/19/2018 1010   RDW 13.3 10/29/2017 1049   LYMPHSABS 0.3 (L) 08/19/2018 1010   LYMPHSABS 0.5 (L) 10/29/2017 1049   MONOABS 0.4 08/19/2018 1010   MONOABS 0.3 10/29/2017 1049   EOSABS 0.0 08/19/2018 1010   EOSABS 0.1 10/29/2017 1049   BASOSABS 0.0 08/19/2018 1010   BASOSABS 0.0 10/29/2017 1049   -------------------------------  Imaging from last 24 hours (if applicable):  Radiology interpretation: No results found.

## 2018-09-09 ENCOUNTER — Inpatient Hospital Stay: Payer: 59

## 2018-09-09 ENCOUNTER — Inpatient Hospital Stay (HOSPITAL_BASED_OUTPATIENT_CLINIC_OR_DEPARTMENT_OTHER): Payer: 59 | Admitting: Internal Medicine

## 2018-09-09 ENCOUNTER — Encounter: Payer: Self-pay | Admitting: Internal Medicine

## 2018-09-09 VITALS — BP 105/77 | HR 77 | Temp 98.4°F | Resp 18 | Ht 71.0 in | Wt 160.1 lb

## 2018-09-09 DIAGNOSIS — Z5112 Encounter for antineoplastic immunotherapy: Secondary | ICD-10-CM | POA: Diagnosis not present

## 2018-09-09 DIAGNOSIS — C321 Malignant neoplasm of supraglottis: Secondary | ICD-10-CM

## 2018-09-09 DIAGNOSIS — Z87891 Personal history of nicotine dependence: Secondary | ICD-10-CM

## 2018-09-09 DIAGNOSIS — Y842 Radiological procedure and radiotherapy as the cause of abnormal reaction of the patient, or of later complication, without mention of misadventure at the time of the procedure: Secondary | ICD-10-CM

## 2018-09-09 DIAGNOSIS — J449 Chronic obstructive pulmonary disease, unspecified: Secondary | ICD-10-CM

## 2018-09-09 DIAGNOSIS — C3412 Malignant neoplasm of upper lobe, left bronchus or lung: Secondary | ICD-10-CM

## 2018-09-09 DIAGNOSIS — R634 Abnormal weight loss: Secondary | ICD-10-CM

## 2018-09-09 DIAGNOSIS — Z79899 Other long term (current) drug therapy: Secondary | ICD-10-CM

## 2018-09-09 DIAGNOSIS — K208 Other esophagitis: Secondary | ICD-10-CM

## 2018-09-09 DIAGNOSIS — Z923 Personal history of irradiation: Secondary | ICD-10-CM | POA: Diagnosis not present

## 2018-09-09 DIAGNOSIS — Z95828 Presence of other vascular implants and grafts: Secondary | ICD-10-CM

## 2018-09-09 DIAGNOSIS — C3490 Malignant neoplasm of unspecified part of unspecified bronchus or lung: Secondary | ICD-10-CM

## 2018-09-09 DIAGNOSIS — L299 Pruritus, unspecified: Secondary | ICD-10-CM

## 2018-09-09 DIAGNOSIS — I10 Essential (primary) hypertension: Secondary | ICD-10-CM

## 2018-09-09 LAB — CBC WITH DIFFERENTIAL (CANCER CENTER ONLY)
ABS IMMATURE GRANULOCYTES: 0.03 10*3/uL (ref 0.00–0.07)
Basophils Absolute: 0 10*3/uL (ref 0.0–0.1)
Basophils Relative: 1 %
Eosinophils Absolute: 0.1 10*3/uL (ref 0.0–0.5)
Eosinophils Relative: 1 %
HEMATOCRIT: 47.1 % (ref 39.0–52.0)
Hemoglobin: 16.2 g/dL (ref 13.0–17.0)
Immature Granulocytes: 1 %
LYMPHS PCT: 8 %
Lymphs Abs: 0.5 10*3/uL — ABNORMAL LOW (ref 0.7–4.0)
MCH: 32.7 pg (ref 26.0–34.0)
MCHC: 34.4 g/dL (ref 30.0–36.0)
MCV: 95.2 fL (ref 80.0–100.0)
MONO ABS: 0.6 10*3/uL (ref 0.1–1.0)
MONOS PCT: 9 %
NEUTROS ABS: 5 10*3/uL (ref 1.7–7.7)
Neutrophils Relative %: 80 %
Platelet Count: 223 10*3/uL (ref 150–400)
RBC: 4.95 MIL/uL (ref 4.22–5.81)
RDW: 11.5 % (ref 11.5–15.5)
WBC Count: 6.2 10*3/uL (ref 4.0–10.5)
nRBC: 0 % (ref 0.0–0.2)

## 2018-09-09 LAB — CMP (CANCER CENTER ONLY)
ALT: 14 U/L (ref 0–44)
AST: 17 U/L (ref 15–41)
Albumin: 3.9 g/dL (ref 3.5–5.0)
Alkaline Phosphatase: 77 U/L (ref 38–126)
Anion gap: 9 (ref 5–15)
BILIRUBIN TOTAL: 0.6 mg/dL (ref 0.3–1.2)
BUN: 8 mg/dL (ref 6–20)
CHLORIDE: 104 mmol/L (ref 98–111)
CO2: 28 mmol/L (ref 22–32)
CREATININE: 0.72 mg/dL (ref 0.61–1.24)
Calcium: 9.5 mg/dL (ref 8.9–10.3)
GFR, Est AFR Am: >60 mL/min (ref >60)
GFR, Estimated: >60 mL/min (ref >60)
Glucose, Bld: 102 mg/dL — ABNORMAL HIGH (ref 70–99)
POTASSIUM: 4.3 mmol/L (ref 3.5–5.1)
Sodium: 141 mmol/L (ref 135–145)
Total Protein: 6.8 g/dL (ref 6.5–8.1)

## 2018-09-09 MED ORDER — SODIUM CHLORIDE 0.9% FLUSH
10.0000 mL | Freq: Once | INTRAVENOUS | Status: AC
  Administered 2018-09-09: 10 mL
  Filled 2018-09-09: qty 10

## 2018-09-09 MED ORDER — SODIUM CHLORIDE 0.9% FLUSH
10.0000 mL | INTRAVENOUS | Status: DC | PRN
  Administered 2018-09-09: 10 mL
  Filled 2018-09-09: qty 10

## 2018-09-09 MED ORDER — SODIUM CHLORIDE 0.9 % IV SOLN
200.0000 mg | Freq: Once | INTRAVENOUS | Status: AC
  Administered 2018-09-09: 200 mg via INTRAVENOUS
  Filled 2018-09-09: qty 8

## 2018-09-09 MED ORDER — HYDROXYZINE HCL 10 MG PO TABS: 10.0000 mg | ORAL_TABLET | Freq: Three times a day (TID) | ORAL | 0 refills | Status: DC | PRN

## 2018-09-09 MED ORDER — SODIUM CHLORIDE 0.9 % IV SOLN
Freq: Once | INTRAVENOUS | Status: AC
  Administered 2018-09-09: 10:00:00 via INTRAVENOUS
  Filled 2018-09-09: qty 250

## 2018-09-09 MED ORDER — HEPARIN SOD (PORK) LOCK FLUSH 100 UNIT/ML IV SOLN
500.0000 [IU] | Freq: Once | INTRAVENOUS | Status: AC | PRN
  Administered 2018-09-09: 500 [IU]
  Filled 2018-09-09: qty 5

## 2018-09-09 NOTE — Progress Notes (Signed)
[place] [phone]   [phone]  OFFICE PROGRESS NOTE  [name], [name], MD [place] [address]  DIAGNOSIS:  1) Stage IIB/IV (T3, N3, M1a) non-small cell lung cancer, squamous cell carcinoma presented with large left hilar mass in addition to mediastinal and left supraclavicular lymphadenopathy as well as contralateral right upper lobe nodule diagnosed in August 2018. PDL 1 expression: 90%. 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 First dose 06/29/2017. Status post 6 cycles. Last dose was given 08/03/2017.  CURRENT THERAPY: Immunotherapy with Ketruda 200 MG IV every 3 weeks, first dose 09/17/2017.  Status post 17 cycles.  INTERVAL HISTORY: Trevor Zavala 52 y.o. male returns to the clinic today for follow-up visit.  The patient is feeling fine today with no concerning complaints except for itching.  He tried Benadryl as well as Allegra with no improvement.  He denied having any chest pain, shortness breath, cough or hemoptysis.  He denied having any fever or chills.  He has no nausea, vomiting, diarrhea or constipation.  He has no headache or visual changes.  The patient is here today for evaluation before starting cycle #18 of his treatment.  MEDICAL HISTORY: Past Medical History:  Diagnosis Date  . Anxiety   . COPD (chronic obstructive pulmonary disease) ([place])   . GERD (gastroesophageal reflux disease)   . Headache   . Hypertension   . Pneumonia   . Situational depression   . Stage III squamous cell carcinoma of left lung ([place]) 06/19/2017   Lungs & Epiglottis  . Tubular adenoma of colon 01/2015  . Tubular adenoma of colon 01/2015    ALLERGIES:  is allergic to escitalopram oxalate.  MEDICATIONS:  Current Outpatient Medications  Medication Sig Dispense Refill  . acyclovir (ZOVIRAX)  400 MG tablet Take 1 tablet (400 mg total) by mouth 3 (three) times daily. (Patient taking differently: Take 400 mg by mouth 3 (three) times daily as needed. ) 21 tablet 3  . cetirizine (ZYRTEC ALLERGY) 10 MG tablet Take 1 tablet (10 mg total) by mouth daily. 30 tablet 5  . clonazePAM (KLONOPIN) 0.5 MG tablet Take 1-2 tablets (0.5-1 mg total) by mouth 2 (two) times daily as needed for anxiety. 180 tablet 2  . docusate sodium (COLACE) 100 MG capsule Take 100 mg by mouth daily as needed.     Marland Kitchen esomeprazole (NEXIUM) 40 MG capsule 1 po qam 90 capsule 3  . fexofenadine (ALLEGRA ALLERGY) 60 MG tablet Take 1 tablet (60 mg total) by mouth 2 (two) times daily. 60 tablet 5  . fluticasone furoate-vilanterol (BREO ELLIPTA) 100-25 MCG/INH AEPB INHALE 1 PUFF INTO THE LUNGS DAILY. 1 each 11  . hydrOXYzine (VISTARIL) 25 MG capsule Take 4 capsules (100 mg total) by mouth at bedtime as needed for itching. 30 capsule 5  . Multiple Vitamin (MULTIVITAMIN WITH MINERALS) TABS tablet Take 1 tablet by mouth daily.    . pembrolizumab (KEYTRUDA) 100 MG/4ML SOLN Inject 2 mg/kg into the vein. 200mg  every 3 weeks.    . diphenhydrAMINE (BENADRYL) 25 MG tablet Take 12.5 mg by mouth every 6 (six) hours as needed for itching.     No current facility-administered medications for this visit.     SURGICAL HISTORY:  Past Surgical History:  Procedure Laterality Date  . COLONOSCOPY    . DIRECT LARYNGOSCOPY N/A  06/25/2017   Procedure: DIRECT LARYNGOSCOPY AND BIOPSY;  Surgeon: Rozetta Nunnery, MD;  Location: [place];  Service: ENT;  Laterality: N/A;  . EAR CYST EXCISION N/A 11/17/2013   Procedure: SEBACEOUS CYST CHEST;  Surgeon: Joyice Faster. Cornett, MD;  Location: [place];  Service: General;  Laterality: N/A;  . IR GASTROSTOMY TUBE MOD SED  08/10/2017  . IR GASTROSTOMY TUBE REMOVAL  09/29/2017  . KNEE ARTHROSCOPY     LEFT  . LIPOMA EXCISION N/A 11/17/2013   Procedure: EXCISION LIPOMA FOREHEAD;   Surgeon: Joyice Faster. Cornett, MD;  Location: [place];  Service: General;  Laterality: N/A;  . LUNG BIOPSY Bilateral 06/12/2017   Procedure: LEFT LUNG BIOPSY;  Surgeon: Grace Isaac, MD;  Location: [place];  Service: Thoracic;  Laterality: Bilateral;  . MICROLARYNGOSCOPY W/VOCAL CORD INJECTION Left 07/16/2018   Procedure: suspened micro laryngoscopy with jet ventilation and prolaryn injection;  Surgeon: Melida Quitter, MD;  Location: [place];  Service: ENT;  Laterality: Left;  . PORTACATH PLACEMENT Right 07/01/2017   Procedure: INSERTION PORT-A-CATH - RIGHT IJ - placed with Fluoro and Ultrasound;  Surgeon: Grace Isaac, MD;  Location: [place];  Service: Thoracic;  Laterality: Right;  Marland Kitchen VIDEO BRONCHOSCOPY WITH ENDOBRONCHIAL ULTRASOUND N/A 06/12/2017   Procedure: VIDEO BRONCHOSCOPY WITH ENDOBRONCHIAL ULTRASOUND;  Surgeon: Grace Isaac, MD;  Location: [place];  Service: Thoracic;  Laterality: N/A;    REVIEW OF SYSTEMS:  A comprehensive review of systems was negative except for: Ears, nose, mouth, throat, and face: positive for hoarseness Integument/breast: positive for pruritus   PHYSICAL EXAMINATION: General appearance: alert, cooperative and no distress Head: Normocephalic, without obvious abnormality, atraumatic Neck: no adenopathy, no JVD, supple, symmetrical, trachea midline and thyroid not enlarged, symmetric, no tenderness/mass/nodules Lymph nodes: Cervical, supraclavicular, and axillary nodes normal. Resp: clear to auscultation bilaterally Back: symmetric, no curvature. ROM normal. No CVA tenderness. Cardio: regular rate and rhythm, S1, S2 normal, no murmur, click, rub or gallop GI: soft, non-tender; bowel sounds normal; no masses,  no organomegaly Extremities: extremities normal, atraumatic, no cyanosis or edema  ECOG PERFORMANCE STATUS: 1 - Symptomatic but completely ambulatory  Blood pressure 105/77, pulse 77, temperature 98.4 F (36.9 C), temperature source Oral,  resp. rate 18, weight 160 lb 0.8 oz (72.6 kg), SpO2 95 %.  LABORATORY DATA: Lab Results  Component Value Date   WBC 6.2 09/09/2018   HGB 16.2 09/09/2018   HCT 47.1 09/09/2018   MCV 95.2 09/09/2018   PLT 223 09/09/2018      Chemistry      Component Value Date/Time   NA 140 08/19/2018 1010   NA 141 10/29/2017 1049   K 4.1 08/19/2018 1010   K 3.3 (L) 10/29/2017 1049   CL 105 08/19/2018 1010   CO2 24 08/19/2018 1010   CO2 26 10/29/2017 1049   BUN 5 (L) 08/19/2018 1010   BUN 11.2 10/29/2017 1049   CREATININE 0.69 08/19/2018 1010   CREATININE 0.7 10/29/2017 1049   GLU 198 08/25/2017      Component Value Date/Time   CALCIUM 9.3 08/19/2018 1010   CALCIUM 9.0 10/29/2017 1049   ALKPHOS 76 08/19/2018 1010   ALKPHOS 82 10/29/2017 1049   AST 17 08/19/2018 1010   AST 10 10/29/2017 1049   ALT 13 08/19/2018 1010   ALT 8 10/29/2017 1049   BILITOT 0.5 08/19/2018 1010   BILITOT 0.22 10/29/2017 1049       RADIOGRAPHIC STUDIES: No results  found.  ASSESSMENT AND PLAN: This is a very pleasant 52 years old white male with recently diagnosed stage IIIB/IV non-small cell lung cancer, adenocarcinoma presented with large left hilar mass in addition to mediastinal and left supraclavicular lymphadenopathy as well as suspicious right upper lobe pulmonary nodule diagnosed in August 2018. 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 He underwent a course of concurrent chemoradiation to the lung as well as the epiglottic area under the care of Dr. Tammi Klippel. He is status post 6 cycle. He tolerated this course of treatment well except for the radiation induced esophagitis as well as weight loss and fatigue. The patient had partial response to the previous treatment. He is currently on treatment with Keytruda 200 mg IV every 3 weeks status post 17 cycles. He continues to tolerate this treatment well except for itching. I recommended for him to proceed with  cycle #18 today as scheduled. I will see him back for follow-up visit in 3 weeks for evaluation after repeating CT scan of the neck and chest for restaging of his disease. For the itching I started the patient on Atarax 10 mg p.o. every 8 hours as needed for itching. He was advised to call immediately if he has any concerning symptoms in the interval. The patient was advised to call immediately if he has any concerning symptoms in the interval. The patient voices understanding of current disease status and treatment options and is in agreement with the current care plan. All questions were answered. The patient knows to call the clinic with any problems, questions or concerns. We can certainly see the patient much sooner if necessary. Disclaimer: This note was dictated with voice recognition software. Similar sounding words can inadvertently be transcribed and may not be corrected upon review.

## 2018-09-09 NOTE — Patient Instructions (Signed)
[facility] Discharge Instructions for Patients Receiving Chemotherapy  Today you received the following chemotherapy agents:  Keytruda.  To help prevent nausea and vomiting after your treatment, we encourage you to take your nausea medication as directed.   If you develop nausea and vomiting that is not controlled by your nausea medication, call the clinic.   BELOW ARE SYMPTOMS THAT SHOULD BE REPORTED IMMEDIATELY:  *FEVER GREATER THAN 100.5 F  *CHILLS WITH OR WITHOUT FEVER  NAUSEA AND VOMITING THAT IS NOT CONTROLLED WITH YOUR NAUSEA MEDICATION  *UNUSUAL SHORTNESS OF BREATH  *UNUSUAL BRUISING OR BLEEDING  TENDERNESS IN MOUTH AND THROAT WITH OR WITHOUT PRESENCE OF ULCERS  *URINARY PROBLEMS  *BOWEL PROBLEMS  UNUSUAL RASH Items with * indicate a potential emergency and should be followed up as soon as possible.  Feel free to call the clinic should you have any questions or concerns. The clinic phone number is [phone].  Please show the CHEMO ALERT CARD at check-in to the Emergency Department and triage nurse.    

## 2018-09-09 NOTE — Patient Instructions (Signed)
Steps to Quit Smoking Smoking tobacco can be bad for your health. It can also affect almost every organ in your body. Smoking puts you and people around you at risk for many serious long-lasting (chronic) diseases. Quitting smoking is hard, but it is one of the best things that you can do for your health. It is never too late to quit. What are the benefits of quitting smoking? When you quit smoking, you lower your risk for getting serious diseases and conditions. They can include:  Lung cancer or lung disease.  Heart disease.  Stroke.  Heart attack.  Not being able to have children (infertility).  Weak bones (osteoporosis) and broken bones (fractures).  If you have coughing, wheezing, and shortness of breath, those symptoms may get better when you quit. You may also get sick less often. If you are pregnant, quitting smoking can help to lower your chances of having a baby of low birth weight. What can I do to help me quit smoking? Talk with your doctor about what can help you quit smoking. Some things you can do (strategies) include:  Quitting smoking totally, instead of slowly cutting back how much you smoke over a period of time.  Going to in-person counseling. You are more likely to quit if you go to many counseling sessions.  Using resources and support systems, such as: ? Online chats with a counselor. ? Phone quitlines. ? Printed self-help materials. ? Support groups or group counseling. ? Text messaging programs. ? Mobile phone apps or applications.  Taking medicines. Some of these medicines may have nicotine in them. If you are pregnant or breastfeeding, do not take any medicines to quit smoking unless your doctor says it is okay. Talk with your doctor about counseling or other things that can help you.  Talk with your doctor about using more than one strategy at the same time, such as taking medicines while you are also going to in-person counseling. This can help make  quitting easier. What things can I do to make it easier to quit? Quitting smoking might feel very hard at first, but there is a lot that you can do to make it easier. Take these steps:  Talk to your family and friends. Ask them to support and encourage you.  Call phone quitlines, reach out to support groups, or work with a counselor.  Ask people who smoke to not smoke around you.  Avoid places that make you want (trigger) to smoke, such as: ? Bars. ? Parties. ? Smoke-break areas at work.  Spend time with people who do not smoke.  Lower the stress in your life. Stress can make you want to smoke. Try these things to help your stress: ? Getting regular exercise. ? Deep-breathing exercises. ? Yoga. ? Meditating. ? Doing a body scan. To do this, close your eyes, focus on one area of your body at a time from head to toe, and notice which parts of your body are tense. Try to relax the muscles in those areas.  Download or buy apps on your mobile phone or tablet that can help you stick to your quit plan. There are many free apps, such as QuitGuide from the CDC (Centers for Disease Control and Prevention). You can find more support from smokefree.gov and other websites.  This information is not intended to replace advice given to you by your health care provider. Make sure you discuss any questions you have with your health care provider. Document Released: 08/23/2009 Document   Revised: 06/24/2016 Document Reviewed: 03/13/2015 Elsevier Interactive Patient Education  2018 [employer]vier Inc.  

## 2018-09-10 ENCOUNTER — Telehealth: Payer: Self-pay | Admitting: Internal Medicine

## 2018-09-10 NOTE — Telephone Encounter (Signed)
Scheduled appt per 10/31 los - pt to get an updated schedule next visit.

## 2018-09-22 ENCOUNTER — Other Ambulatory Visit: Payer: Self-pay | Admitting: Medical Oncology

## 2018-09-22 ENCOUNTER — Encounter: Payer: Managed Care, Other (non HMO) | Admitting: Gastroenterology

## 2018-09-22 DIAGNOSIS — C321 Malignant neoplasm of supraglottis: Secondary | ICD-10-CM

## 2018-09-24 ENCOUNTER — Telehealth: Payer: Self-pay | Admitting: *Deleted

## 2018-09-24 NOTE — Telephone Encounter (Signed)
Medical records faxed to [facility]; ID# [id number]

## 2018-09-28 ENCOUNTER — Ambulatory Visit (HOSPITAL_COMMUNITY)
Admission: RE | Admit: 2018-09-28 | Discharge: 2018-09-28 | Disposition: A | Discharge status: Home / Self Care | Payer: 59 | Source: Ambulatory Visit | Attending: Internal Medicine | Admitting: Internal Medicine

## 2018-09-28 DIAGNOSIS — J90 Pleural effusion, not elsewhere classified: Secondary | ICD-10-CM | POA: Insufficient documentation

## 2018-09-28 DIAGNOSIS — I700 Atherosclerosis of aorta: Secondary | ICD-10-CM | POA: Insufficient documentation

## 2018-09-28 DIAGNOSIS — C3490 Malignant neoplasm of unspecified part of unspecified bronchus or lung: Secondary | ICD-10-CM | POA: Diagnosis not present

## 2018-09-28 DIAGNOSIS — J439 Emphysema, unspecified: Secondary | ICD-10-CM | POA: Insufficient documentation

## 2018-09-28 DIAGNOSIS — Z923 Personal history of irradiation: Secondary | ICD-10-CM | POA: Diagnosis not present

## 2018-09-28 DIAGNOSIS — C321 Malignant neoplasm of supraglottis: Secondary | ICD-10-CM

## 2018-09-28 MED ORDER — SODIUM CHLORIDE (PF) 0.9 % IJ SOLN
INTRAMUSCULAR | Status: AC
  Filled 2018-09-28: qty 50

## 2018-09-28 MED ORDER — IOHEXOL 300 MG/ML  SOLN
75.0000 mL | Freq: Once | INTRAMUSCULAR | Status: AC | PRN
  Administered 2018-09-28: 75 mL via INTRAVENOUS

## 2018-09-29 ENCOUNTER — Other Ambulatory Visit: Payer: Self-pay | Admitting: *Deleted

## 2018-09-29 DIAGNOSIS — C321 Malignant neoplasm of supraglottis: Secondary | ICD-10-CM

## 2018-09-30 ENCOUNTER — Inpatient Hospital Stay (HOSPITAL_BASED_OUTPATIENT_CLINIC_OR_DEPARTMENT_OTHER): Payer: 59 | Admitting: Oncology

## 2018-09-30 ENCOUNTER — Inpatient Hospital Stay: Payer: 59 | Attending: Internal Medicine

## 2018-09-30 ENCOUNTER — Inpatient Hospital Stay: Payer: 59

## 2018-09-30 ENCOUNTER — Encounter: Payer: Self-pay | Admitting: Oncology

## 2018-09-30 VITALS — BP 118/80 | HR 77 | Temp 98.2°F | Resp 18 | Ht 71.0 in | Wt 161.3 lb

## 2018-09-30 DIAGNOSIS — I700 Atherosclerosis of aorta: Secondary | ICD-10-CM | POA: Insufficient documentation

## 2018-09-30 DIAGNOSIS — I10 Essential (primary) hypertension: Secondary | ICD-10-CM | POA: Diagnosis not present

## 2018-09-30 DIAGNOSIS — J449 Chronic obstructive pulmonary disease, unspecified: Secondary | ICD-10-CM | POA: Insufficient documentation

## 2018-09-30 DIAGNOSIS — Z5112 Encounter for antineoplastic immunotherapy: Secondary | ICD-10-CM | POA: Insufficient documentation

## 2018-09-30 DIAGNOSIS — C3412 Malignant neoplasm of upper lobe, left bronchus or lung: Secondary | ICD-10-CM

## 2018-09-30 DIAGNOSIS — Z79899 Other long term (current) drug therapy: Secondary | ICD-10-CM

## 2018-09-30 DIAGNOSIS — L299 Pruritus, unspecified: Secondary | ICD-10-CM

## 2018-09-30 DIAGNOSIS — Y842 Radiological procedure and radiotherapy as the cause of abnormal reaction of the patient, or of later complication, without mention of misadventure at the time of the procedure: Secondary | ICD-10-CM | POA: Insufficient documentation

## 2018-09-30 DIAGNOSIS — K210 Gastro-esophageal reflux disease with esophagitis: Secondary | ICD-10-CM | POA: Insufficient documentation

## 2018-09-30 DIAGNOSIS — F419 Anxiety disorder, unspecified: Secondary | ICD-10-CM

## 2018-09-30 DIAGNOSIS — C321 Malignant neoplasm of supraglottis: Secondary | ICD-10-CM | POA: Insufficient documentation

## 2018-09-30 DIAGNOSIS — R634 Abnormal weight loss: Secondary | ICD-10-CM | POA: Diagnosis not present

## 2018-09-30 LAB — CBC WITH DIFFERENTIAL (CANCER CENTER ONLY)
Abs Immature Granulocytes: 0.02 10*3/uL (ref 0.00–0.07)
Basophils Absolute: 0 10*3/uL (ref 0.0–0.1)
Basophils Relative: 1 %
EOS ABS: 0.1 10*3/uL (ref 0.0–0.5)
Eosinophils Relative: 1 %
HCT: 47.3 % (ref 39.0–52.0)
Hemoglobin: 16 g/dL (ref 13.0–17.0)
Immature Granulocytes: 0 %
Lymphocytes Relative: 8 %
Lymphs Abs: 0.5 10*3/uL — ABNORMAL LOW (ref 0.7–4.0)
MCH: 32.6 pg (ref 26.0–34.0)
MCHC: 33.8 g/dL (ref 30.0–36.0)
MCV: 96.3 fL (ref 80.0–100.0)
MONO ABS: 0.5 10*3/uL (ref 0.1–1.0)
MONOS PCT: 9 %
NEUTROS ABS: 4.9 10*3/uL (ref 1.7–7.7)
Neutrophils Relative %: 81 %
PLATELETS: 218 10*3/uL (ref 150–400)
RBC: 4.91 MIL/uL (ref 4.22–5.81)
RDW: 11.9 % (ref 11.5–15.5)
WBC: 6 10*3/uL (ref 4.0–10.5)
nRBC: 0 % (ref 0.0–0.2)

## 2018-09-30 LAB — CMP (CANCER CENTER ONLY)
ALBUMIN: 3.8 g/dL (ref 3.5–5.0)
ALK PHOS: 80 U/L (ref 38–126)
ALT: 10 U/L (ref 0–44)
ANION GAP: 8 (ref 5–15)
AST: 13 U/L — ABNORMAL LOW (ref 15–41)
BUN: 7 mg/dL (ref 6–20)
CALCIUM: 9.4 mg/dL (ref 8.9–10.3)
CO2: 28 mmol/L (ref 22–32)
Chloride: 105 mmol/L (ref 98–111)
Creatinine: 0.72 mg/dL (ref 0.61–1.24)
GFR, Est AFR Am: >60 mL/min (ref >60)
GLUCOSE: 110 mg/dL — AB (ref 70–99)
POTASSIUM: 4.1 mmol/L (ref 3.5–5.1)
Sodium: 141 mmol/L (ref 135–145)
Total Bilirubin: 0.5 mg/dL (ref 0.3–1.2)
Total Protein: 6.6 g/dL (ref 6.5–8.1)

## 2018-09-30 IMAGING — CT CT ANGIO CHEST
2 of 7 series · 18 of 46 positions shown · IV contrast (APPLIED)
Comparison: Chest x-ray earlier today.  Chest CT 06/05/2017

CLINICAL DATA: Shortness of breath, cough

EXAM:
CT ANGIOGRAPHY CHEST WITH CONTRAST
TECHNIQUE: Multidetector CT imaging of the chest was performed using the
standard protocol during bolus administration of intravenous
contrast. Multiplanar CT image reconstructions and MIPs were
obtained to evaluate the vascular anatomy.
CONTRAST:  80 cc Isovue 370 IV

[Series 7: thins · axial · 0.68mm/px · z∈[+1254,+1537]mm · 15 of 455 slices shown]
[im 26/455  lung]
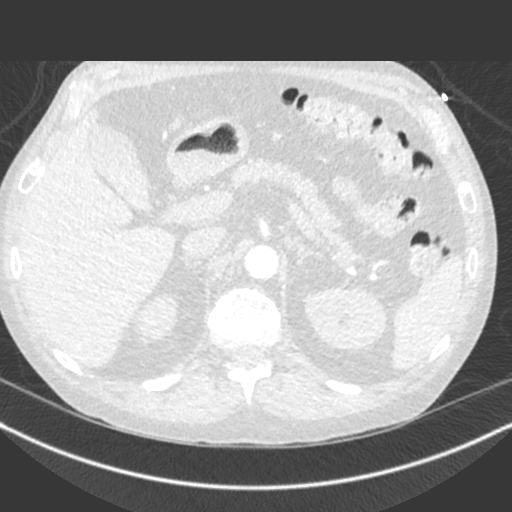
[im 51/455  soft-tissue]
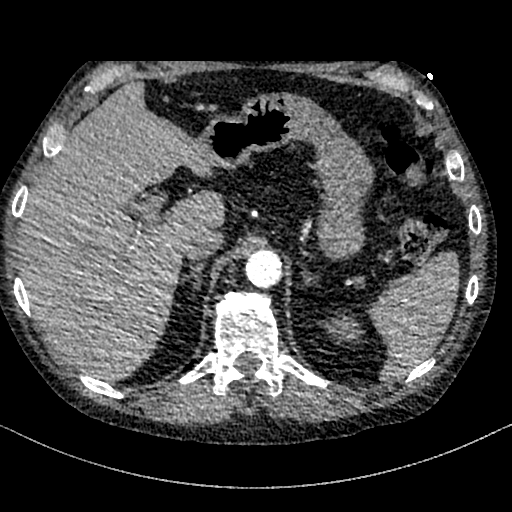
[im 76/455  lung]
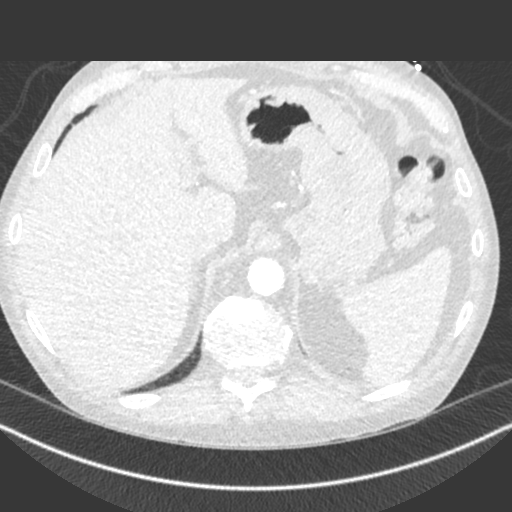
[im 101/455  soft-tissue]
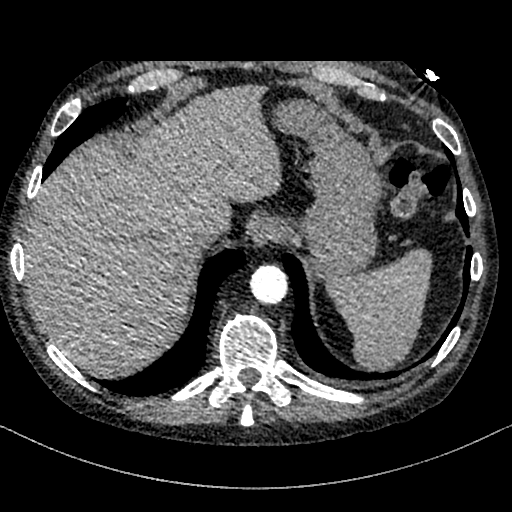
[im 152/455  lung]
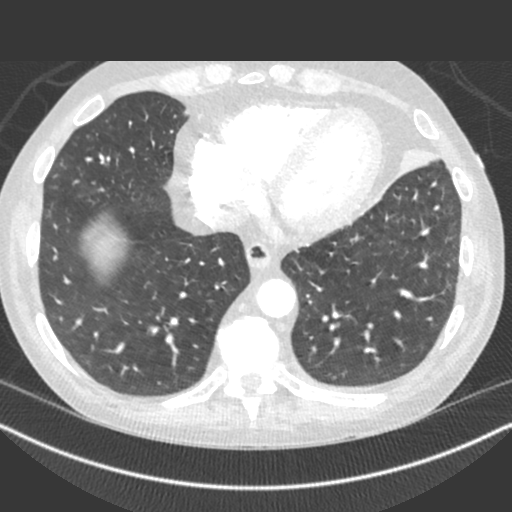
[im 177/455  soft-tissue]
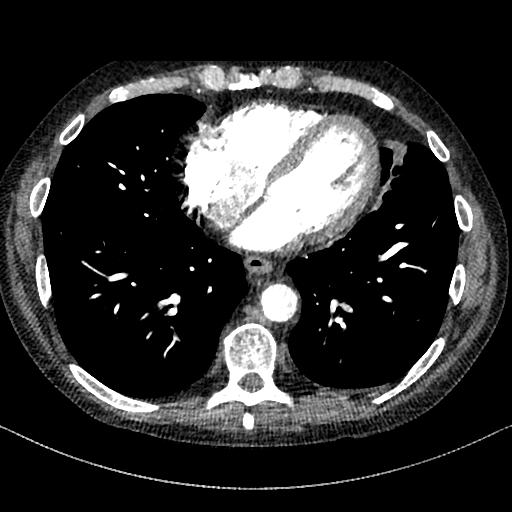
[im 202/455  lung]
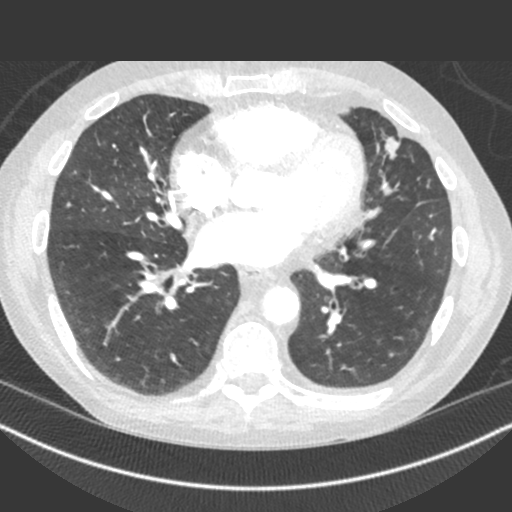
[im 228/455  soft-tissue]
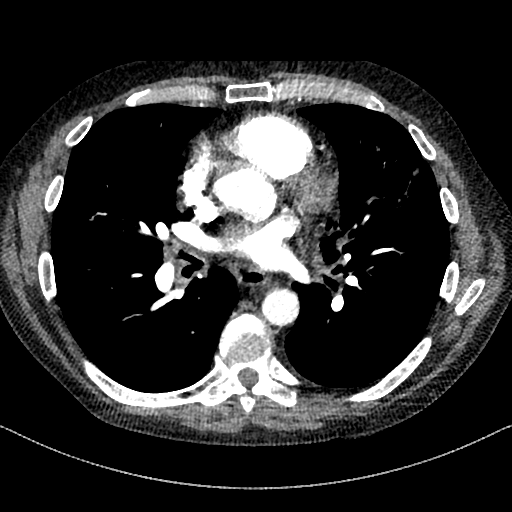
[im 253/455  lung]
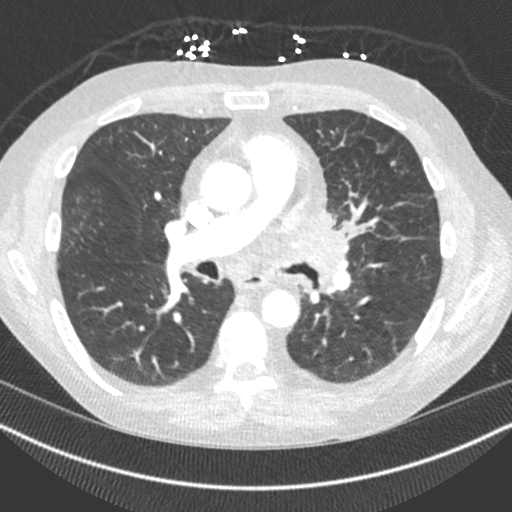
[im 278/455  soft-tissue]
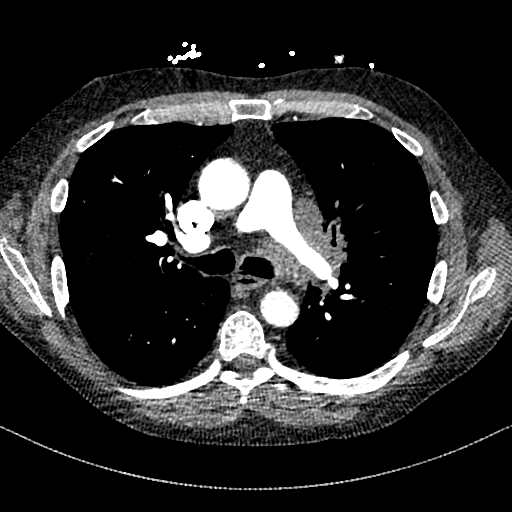
[im 303/455  lung]
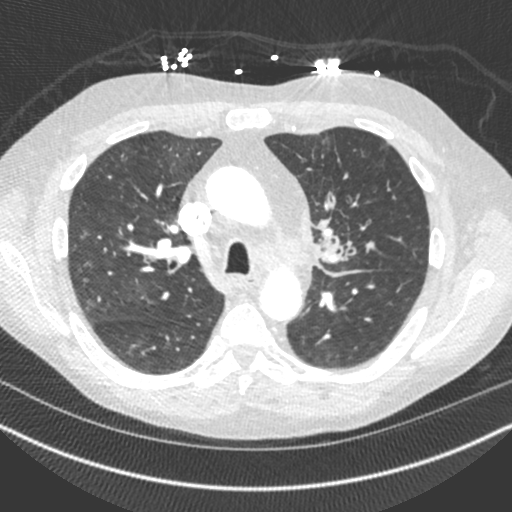
[im 354/455  soft-tissue]
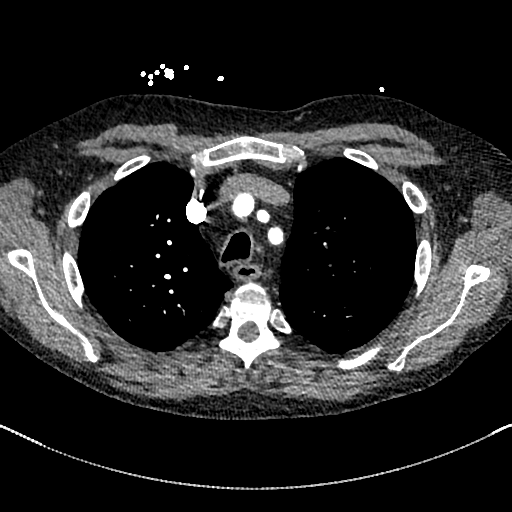
[im 379/455  lung]
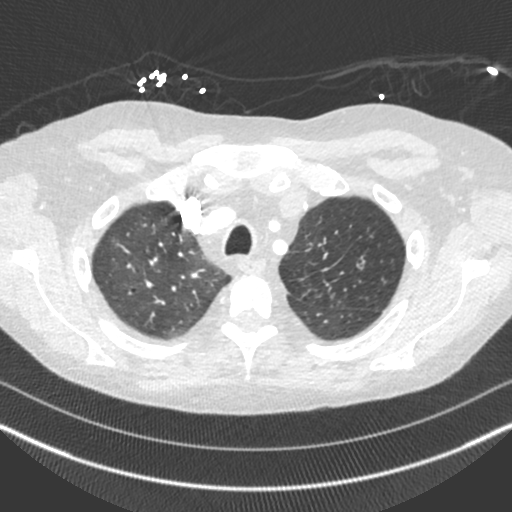
[im 404/455  soft-tissue]
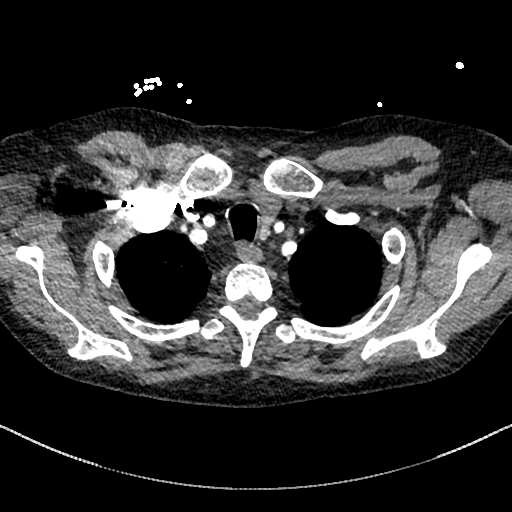
[im 429/455  lung]
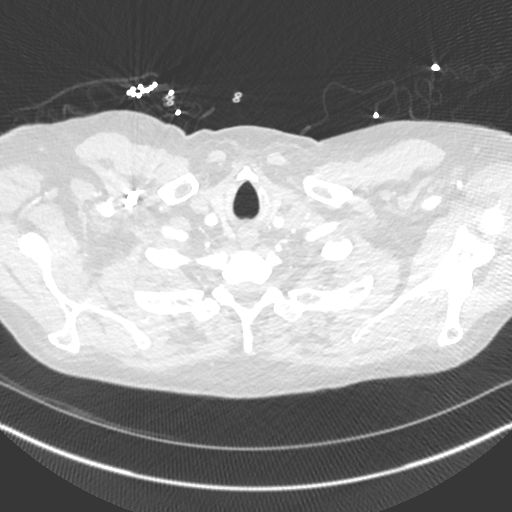

[Series 8: cor · coronal · 0.62mm/px · 3 of 116 slices shown]
[im 29/116  soft-tissue]
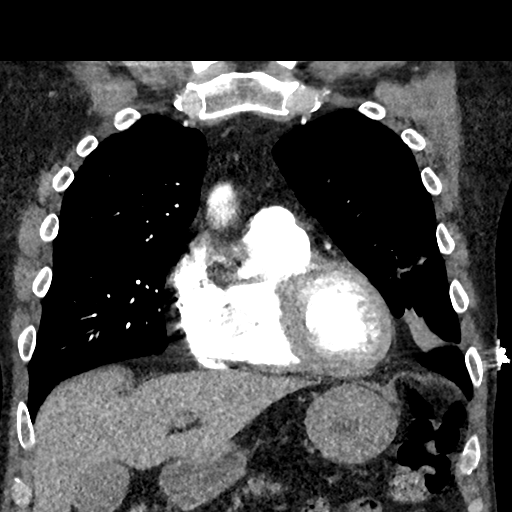
[im 58/116  soft-tissue]
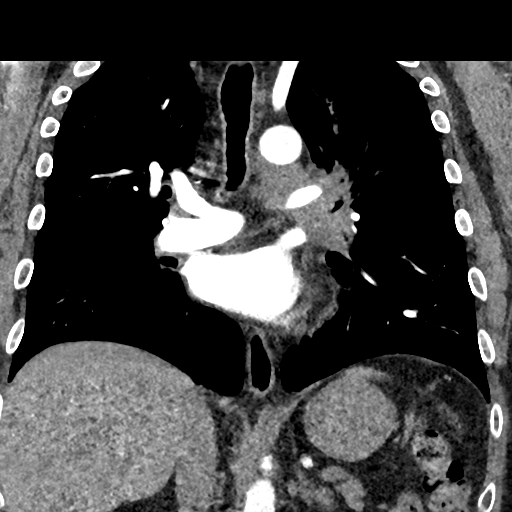
[im 87/116  soft-tissue]
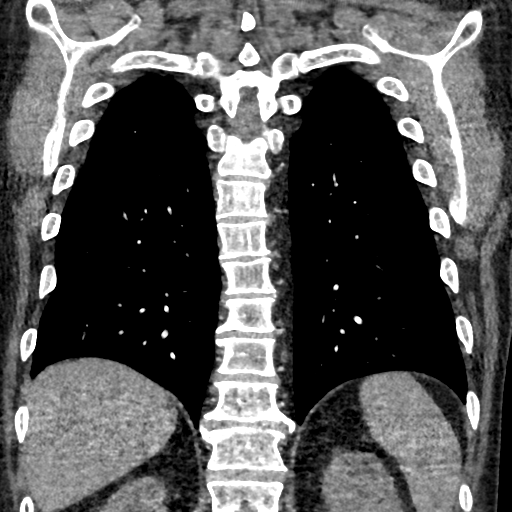

[18 of 46 positions shown; findings below may reference images not displayed]

FINDINGS: Cardiovascular: No filling defects in the pulmonary artery to
suggest pulmonary emboli. Atherosclerotic calcifications scattered
in the aorta. No evidence of aneurysm. Left pulmonary arterial
encasement by tumor as described below.

Mediastinum/Nodes: Again noted is the large left hilar mass encasing
the pulmonary vessels and left upper lobe bronchus, unchanged since
study 2 days ago. AP window adenopathy is stable. No right hilar or
axillary adenopathy. Left supraclavicular adenopathy again noted,
unchanged.

Lungs/Pleura: Airway thickening bilaterally, likely bronchitis. No
effusions. Lingular atelectasis. Patchy pneumonitis in the left
upper lobe, stable. Previously seen 7 mm nodule in the left upper
lobe is not as well visualized but likely present on image 27. Right
upper lobe nodule anteriorly on image 47 measures 10 mm, stable. No
effusions.

Upper Abdomen: Imaging into the upper abdomen shows no acute
findings.

Musculoskeletal: Chest wall soft tissues are unremarkable. No acute
bony abnormality.

Review of the MIP images confirms the above findings.
IMPRESSION: No evidence of pulmonary embolus.

Stable encasement of the left pulmonary vessels and upper lobe
bronchus by a irregular left hilar mass. Associated AP
window/mediastinal adenopathy and left supraclavicular adenopathy.
Findings are stable since study 2 days ago. Findings highly
suspicious for bronchogenic carcinoma.

Stable bilateral pulmonary nodules.

Stable left upper lobe pneumonitis and diffuse peribronchial
thickening.

## 2018-09-30 MED ORDER — SODIUM CHLORIDE 0.9 % IV SOLN
200.0000 mg | Freq: Once | INTRAVENOUS | Status: AC
  Administered 2018-09-30: 200 mg via INTRAVENOUS
  Filled 2018-09-30: qty 8

## 2018-09-30 MED ORDER — SODIUM CHLORIDE 0.9 % IV SOLN
Freq: Once | INTRAVENOUS | Status: AC
  Administered 2018-09-30: 12:00:00 via INTRAVENOUS
  Filled 2018-09-30: qty 250

## 2018-09-30 MED ORDER — HEPARIN SOD (PORK) LOCK FLUSH 100 UNIT/ML IV SOLN
500.0000 [IU] | Freq: Once | INTRAVENOUS | Status: AC | PRN
  Administered 2018-09-30: 500 [IU]
  Filled 2018-09-30: qty 5

## 2018-09-30 MED ORDER — SODIUM CHLORIDE 0.9% FLUSH
10.0000 mL | INTRAVENOUS | Status: DC | PRN
  Administered 2018-09-30: 10 mL
  Filled 2018-09-30: qty 10

## 2018-09-30 NOTE — Patient Instructions (Signed)
[facility] Discharge Instructions for Patients Receiving Chemotherapy  Today you received the following chemotherapy agents:  Keytruda.  To help prevent nausea and vomiting after your treatment, we encourage you to take your nausea medication as directed.   If you develop nausea and vomiting that is not controlled by your nausea medication, call the clinic.   BELOW ARE SYMPTOMS THAT SHOULD BE REPORTED IMMEDIATELY:  *FEVER GREATER THAN 100.5 F  *CHILLS WITH OR WITHOUT FEVER  NAUSEA AND VOMITING THAT IS NOT CONTROLLED WITH YOUR NAUSEA MEDICATION  *UNUSUAL SHORTNESS OF BREATH  *UNUSUAL BRUISING OR BLEEDING  TENDERNESS IN MOUTH AND THROAT WITH OR WITHOUT PRESENCE OF ULCERS  *URINARY PROBLEMS  *BOWEL PROBLEMS  UNUSUAL RASH Items with * indicate a potential emergency and should be followed up as soon as possible.  Feel free to call the clinic should you have any questions or concerns. The clinic phone number is [phone].  Please show the CHEMO ALERT CARD at check-in to the Emergency Department and triage nurse.    

## 2018-09-30 NOTE — Progress Notes (Signed)
[facility] OFFICE PROGRESS NOTE  [name], [name], MD [place] [address]  DIAGNOSIS:  1) Stage IIB/IV (T3, N3, M1a) non-small cell lung cancer, squamous cell carcinoma presented with large left hilar mass in addition to mediastinal and left supraclavicular lymphadenopathy as well as contralateral right upper lobe nodule diagnosed in August 2018. PDL 1 expression: 90%. 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 First dose 06/29/2017. Status post 6 cycles. Last dose was given 08/03/2017.  CURRENT THERAPY: Immunotherapy with Keytruda 200 MG IV every 3 weeks, first dose 09/17/2017.  Status post 18 cycles.  INTERVAL HISTORY: Trevor Zavala 52 y.o. male returns for routine follow-up visit accompanied by his wife.  The patient is feeling fine today and has no specific complaints except for ongoing itching.  He uses Atarax which helps some but causes some sedation.  He finds that use of head and shoulders dandruff shampoo seems to help the best.  Itching is tolerable at this time.  Denies fevers and chills.  Denies chest pain, shortness of breath, cough, hemoptysis.  Denies nausea, vomiting, constipation, diarrhea.  Denies difficulty swallowing.  Denies recent weight loss or night sweats.  He had a restaging CT scan of the neck and chest and is here to discuss the results.  MEDICAL HISTORY: Past Medical History:  Diagnosis Date  . Anxiety   . COPD (chronic obstructive pulmonary disease) ([place])   . GERD (gastroesophageal reflux disease)   . Headache   . Hypertension   . Pneumonia   . Situational depression   . Stage III squamous cell carcinoma of left lung ([place]) 06/19/2017   Lungs & Epiglottis  . Tubular adenoma of colon 01/2015  . Tubular adenoma of colon 01/2015    ALLERGIES:  is allergic to escitalopram  oxalate.  MEDICATIONS:  Current Outpatient Medications  Medication Sig Dispense Refill  . clonazePAM (KLONOPIN) 0.5 MG tablet Take 1-2 tablets (0.5-1 mg total) by mouth 2 (two) times daily as needed for anxiety. 180 tablet 2  . docusate sodium (COLACE) 100 MG capsule Take 100 mg by mouth daily as needed.     Marland Kitchen esomeprazole (NEXIUM) 40 MG capsule 1 po qam 90 capsule 3  . fluticasone furoate-vilanterol (BREO ELLIPTA) 100-25 MCG/INH AEPB INHALE 1 PUFF INTO THE LUNGS DAILY. 1 each 11  . pembrolizumab (KEYTRUDA) 100 MG/4ML SOLN Inject 2 mg/kg into the vein. 200mg  every 3 weeks.    Marland Kitchen acyclovir (ZOVIRAX) 400 MG tablet Take 1 tablet (400 mg total) by mouth 3 (three) times daily. (Patient not taking: Reported on 09/30/2018) 21 tablet 3  . diphenhydrAMINE (BENADRYL) 25 MG tablet Take 12.5 mg by mouth every 6 (six) hours as needed for itching.    . fexofenadine (ALLEGRA ALLERGY) 60 MG tablet Take 1 tablet (60 mg total) by mouth 2 (two) times daily. (Patient not taking: Reported on 09/30/2018) 60 tablet 5  . hydrOXYzine (ATARAX/VISTARIL) 10 MG tablet Take 1 tablet (10 mg total) by mouth 3 (three) times daily as needed. (Patient not taking: Reported on 09/30/2018) 30 tablet 0  . hydrOXYzine (VISTARIL) 25 MG capsule Take 4 capsules (100 mg total) by mouth at bedtime as needed for itching. (Patient not taking: Reported on 09/30/2018) 30 capsule 5  . Multiple Vitamin (MULTIVITAMIN WITH MINERALS) TABS tablet Take 1 tablet by mouth daily.     No current facility-administered medications  for this visit.    Facility-Administered Medications Ordered in Other Visits  Medication Dose Route Frequency Provider Last Rate Last Dose  . sodium chloride flush (NS) 0.9 % injection 10 mL  10 mL Intracatheter PRN Curt Bears, MD   10 mL at 09/30/18 1349    SURGICAL HISTORY:  Past Surgical History:  Procedure Laterality Date  . COLONOSCOPY    . DIRECT LARYNGOSCOPY N/A 06/25/2017   Procedure: DIRECT LARYNGOSCOPY AND  BIOPSY;  Surgeon: Rozetta Nunnery, MD;  Location: [facility];  Service: ENT;  Laterality: N/A;  . EAR CYST EXCISION N/A 11/17/2013   Procedure: SEBACEOUS CYST CHEST;  Surgeon: Joyice Faster. Cornett, MD;  Location: [facility];  Service: General;  Laterality: N/A;  . IR GASTROSTOMY TUBE MOD SED  08/10/2017  . IR GASTROSTOMY TUBE REMOVAL  09/29/2017  . KNEE ARTHROSCOPY     LEFT  . LIPOMA EXCISION N/A 11/17/2013   Procedure: EXCISION LIPOMA FOREHEAD;  Surgeon: Joyice Faster. Cornett, MD;  Location: [facility];  Service: General;  Laterality: N/A;  . LUNG BIOPSY Bilateral 06/12/2017   Procedure: LEFT LUNG BIOPSY;  Surgeon: Grace Isaac, MD;  Location: [facility];  Service: Thoracic;  Laterality: Bilateral;  . MICROLARYNGOSCOPY W/VOCAL CORD INJECTION Left 07/16/2018   Procedure: suspened micro laryngoscopy with jet ventilation and prolaryn injection;  Surgeon: Melida Quitter, MD;  Location: [facility];  Service: ENT;  Laterality: Left;  . PORTACATH PLACEMENT Right 07/01/2017   Procedure: INSERTION PORT-A-CATH - RIGHT IJ - placed with Fluoro and Ultrasound;  Surgeon: Grace Isaac, MD;  Location: [facility];  Service: Thoracic;  Laterality: Right;  Marland Kitchen VIDEO BRONCHOSCOPY WITH ENDOBRONCHIAL ULTRASOUND N/A 06/12/2017   Procedure: VIDEO BRONCHOSCOPY WITH ENDOBRONCHIAL ULTRASOUND;  Surgeon: Grace Isaac, MD;  Location: [facility];  Service: Thoracic;  Laterality: N/A;    REVIEW OF SYSTEMS:   Review of Systems  Constitutional: Negative for appetite change, chills, fatigue, fever and unexpected weight change.  HENT:   Negative for mouth sores, nosebleeds, sore throat and trouble swallowing.   Eyes: Negative for eye problems and icterus.  Respiratory: Negative for cough, hemoptysis, shortness of breath and wheezing.   Cardiovascular: Negative for chest pain and leg swelling.  Gastrointestinal: Negative for abdominal pain, constipation, diarrhea, nausea and vomiting.   Genitourinary: Negative for bladder incontinence, difficulty urinating, dysuria, frequency and hematuria.   Musculoskeletal: Negative for back pain, gait problem, neck pain and neck stiffness.  Skin: Positive for itching. Neurological: Negative for dizziness, extremity weakness, gait problem, headaches, light-headedness and seizures.  Hematological: Negative for adenopathy. Does not bruise/bleed easily.  Psychiatric/Behavioral: Negative for confusion, depression and sleep disturbance. The patient is not nervous/anxious.     PHYSICAL EXAMINATION:  Blood pressure 118/80, pulse 77, temperature 98.2 F (36.8 C), temperature source Oral, resp. rate 18, height 5\' 11"  (1.803 m), weight 161 lb 4.8 oz (73.2 kg), SpO2 95 %.  ECOG PERFORMANCE STATUS: 1 - Symptomatic but completely ambulatory  Physical Exam  Constitutional: Oriented to person, place, and time and well-developed, well-nourished, and in no distress. No distress.  HENT:  Head: Normocephalic and atraumatic.  Mouth/Throat: Oropharynx is clear and moist. No oropharyngeal exudate.  Eyes: Conjunctivae are normal. Right eye exhibits no discharge. Left eye exhibits no discharge. No scleral icterus.  Neck: Normal range of motion. Neck supple.  Cardiovascular: Normal rate, regular rhythm, normal heart sounds and intact distal pulses.   Pulmonary/Chest: Effort normal and breath sounds normal. No respiratory distress. No  wheezes. No rales.  Abdominal: Soft. Bowel sounds are normal. Exhibits no distension and no mass. There is no tenderness.  Musculoskeletal: Normal range of motion. Exhibits no edema.  Lymphadenopathy:    No cervical adenopathy.  Neurological: Alert and oriented to person, place, and time. Exhibits normal muscle tone. Gait normal. Coordination normal.  Skin: Skin is warm and dry. No rash noted. Not diaphoretic. No erythema. No pallor.  Psychiatric: Mood, memory and judgment normal.  Vitals reviewed.  LABORATORY DATA: Lab  Results  Component Value Date   WBC 6.0 09/30/2018   HGB 16.0 09/30/2018   HCT 47.3 09/30/2018   MCV 96.3 09/30/2018   PLT 218 09/30/2018      Chemistry      Component Value Date/Time   NA 141 09/30/2018 1049   NA 141 10/29/2017 1049   K 4.1 09/30/2018 1049   K 3.3 (L) 10/29/2017 1049   CL 105 09/30/2018 1049   CO2 28 09/30/2018 1049   CO2 26 10/29/2017 1049   BUN 7 09/30/2018 1049   BUN 11.2 10/29/2017 1049   CREATININE 0.72 09/30/2018 1049   CREATININE 0.7 10/29/2017 1049   GLU 198 08/25/2017      Component Value Date/Time   CALCIUM 9.4 09/30/2018 1049   CALCIUM 9.0 10/29/2017 1049   ALKPHOS 80 09/30/2018 1049   ALKPHOS 82 10/29/2017 1049   AST 13 (L) 09/30/2018 1049   AST 10 10/29/2017 1049   ALT 10 09/30/2018 1049   ALT 8 10/29/2017 1049   BILITOT 0.5 09/30/2018 1049   BILITOT 0.22 10/29/2017 1049       RADIOGRAPHIC STUDIES:  Ct Soft Tissue Neck W Contrast  Result Date: 09/28/2018 CLINICAL DATA:  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 EXAM: CT NECK WITH CONTRAST TECHNIQUE: Multidetector CT imaging of the neck was performed using the standard protocol following the bolus administration of intravenous contrast. CONTRAST:  84mL OMNIPAQUE IOHEXOL 300 MG/ML  SOLN COMPARISON:  07/27/2018 and multiple previous FINDINGS: Pharynx and larynx: No evidence of discernible mucosal or submucosal mass. Post treatment thickening of the epiglottis in the supraglottic mucosal and submucosal tissues. Hyperdensity associated with previous bilateral vocal fold augmentation. Salivary glands: Normal Thyroid: Normal Lymph nodes: No enlarged or low-density nodes on either side of the neck. Vascular: Carotid artery atherosclerosis. Right internal jugular Port-A-Cath. Limited intracranial: Normal Visualized orbits: Normal Mastoids and visualized paranasal sinuses: Clear Skeleton: Normal Upper chest: See results of  chest CT. Other: None IMPRESSION: Stable and satisfactory post treatment appearance of the neck. NI-RADS category 1. No evidence of residual or recurrent disease. Post treatment changes of the epiglottis and supraglottic region. Previous vocal fold implants. Electronically Signed   By: Nelson Chimes M.D.   On: 09/28/2018 09:27   Ct Chest W Contrast  Result Date: 09/28/2018 CLINICAL DATA:  Non-small-cell lung cancer, squamous cell carcinoma. Diagnosed in August of 2018. Radiation therapy and chemotherapy completed. On immunotherapy. EXAM: CT CHEST WITH CONTRAST TECHNIQUE: Multidetector CT imaging of the chest was performed during intravenous contrast administration. CONTRAST:  73mL OMNIPAQUE IOHEXOL 300 MG/ML  SOLN COMPARISON:  07/27/2018 FINDINGS: Cardiovascular: Right Port-A-Cath tip at high right atrium. Aortic atherosclerosis. Normal heart size, without pericardial effusion. No central pulmonary embolism, on this non-dedicated study. Mediastinum/Nodes: No mediastinal adenopathy. Similar appearance of soft tissue density in the AP window region, including on image 26/3. Lungs/Pleura: Similar small left pleural effusion. Diffuse, slightly worse on the left bronchial wall  thickening is similar. Left upper lobe endobronchial irregularity, including on image 70/4, is not significantly changed. Centrilobular and paraseptal emphysema. Redemonstration of left perihilar and central upper lobe interstitial thickening, likely radiation induced. Subtle increase in soft tissue density surrounding left upper lobe bronchovascular structures. For example image 56/4 today versus 64/6 on the prior. Also coronal image 69 today versus 74 on the prior. Upper Abdomen: Normal imaged portions of the liver, spleen, pancreas, gallbladder, adrenal glands, kidneys. Proximal gastric underdistention. Musculoskeletal: Midthoracic spondylosis. IMPRESSION: 1. Radiation changes within the central left upper lobe and left perihilar region.  Subtle increased soft tissue density surrounding left upper lobe bronchovascular structures may relate to evolving radiation change. Subtle residual or recurrent disease cannot be excluded. This could either be re-evaluated on follow-up or more entirely characterized with PET. 2. Otherwise, no evidence of residual or progressive disease. 3. Similar small left pleural effusion. 4. Aortic atherosclerosis (ICD10-I70.0) and emphysema (ICD10-J43.9). Electronically Signed   By: Abigail Miyamoto M.D.   On: 09/28/2018 09:25     ASSESSMENT/PLAN:  This is a very pleasant 52 years old white male with recently diagnosed stage IIIB/IV non-small cell lung cancer, adenocarcinoma presented with large left hilar mass in addition to mediastinal and left supraclavicular lymphadenopathy as well as suspicious right upper lobe pulmonary nodule diagnosed in August 2018. 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 He underwent a course of concurrent chemoradiation to the lung as well as the epiglottic area under the care of Dr. Tammi Klippel. He is status post 6 cycle. He tolerated this course of treatment well except for the radiation induced esophagitis as well as weight loss and fatigue. The patient had partial response to the previous treatment. He is currently on treatment with Keytruda 200 mg IV every 3 weeks status post 18 cycles. He continues to tolerate this treatment well except for itching. He had a restaging CT scan of the neck and chest and is here to discuss the results.  The patient was seen with Dr. Julien Nordmann.  CT scan results were discussed with the patient and his wife.  CT scan of the neck is stable.  CT scan of the chest is overall stable but there is slight increase in the soft tissue density surrounding the left upper lobe bronchovascular structures which could represent evolving radiation changes or disease progression.  Images were reviewed independently and appear to be  overall stable.  Recommend for him to continue on Keytruda.  He will proceed with cycle #19 today as scheduled.  We will follow the area in the left upper lobe on future scans closely. The patient will follow-up in 3 weeks for evaluation prior to cycle #20.  For the itching, he may continue to use Atarax as needed for itching.  He was advised to call immediately if he has any concerning symptoms in the interval. The patient was advised to call immediately if he has any concerning symptoms in the interval. The patient voices understanding of current disease status and treatment options and is in agreement with the current care plan. All questions were answered. The patient knows to call the clinic with any problems, questions or concerns. We can certainly see the patient much sooner if necessary.  Orders Placed This Encounter  Procedures  . CBC with Differential (Cancer Center Only)    Standing Status:   Standing    Number of Occurrences:   20    Standing Expiration Date:   10/01/2019  . CMP (  [facility] only)    Standing Status:   Standing    Number of Occurrences:   20    Standing Expiration Date:   10/01/2019  . TSH    Standing Status:   Standing    Number of Occurrences:   20    Standing Expiration Date:   10/01/2019     Mikey Bussing, DNP, AGPCNP-BC, AOCNP 09/30/18   ADDENDUM: Hematology/Oncology Attending: I had a face-to-face encounter with the patient.  I recommended his care plan.  This is a very pleasant 52 years old white male with metastatic non-small cell lung cancer, squamous cell carcinoma.  He is currently undergoing treatment with Keytruda 200 mg IV every 3 weeks status post 18 cycles.  He has been tolerating this treatment well with no concerning adverse effects.  He had repeat CT scan of the neck, chest, abdomen and pelvis performed recently.  I personally and independently reviewed the scans and discussed the results with the patient and his wife today. His a  scan showed no concerning findings for disease progression. I recommended for the patient to continue his current treatment with [facility] and he will proceed with cycle #19 today. The patient will come back for follow-up visit in 3 weeks for evaluation before starting cycle #20. The patient was advised to call immediately if he has any concerning symptoms in the interval. Eilleen Kempf, MD 10/01/18

## 2018-10-01 ENCOUNTER — Telehealth: Payer: Self-pay | Admitting: Oncology

## 2018-10-01 NOTE — Telephone Encounter (Signed)
3cycles already scheduled per 11/21 los.  No additional appts added.

## 2018-10-04 ENCOUNTER — Telehealth: Payer: Self-pay | Admitting: Oncology

## 2018-10-04 IMAGING — CT NM PET TUM IMG INITIAL (PI) SKULL BASE T - THIGH
10 of 11 series · 22 of 25 positions shown · non-contrast
Comparison: Chest CT 06/05/2017 and 06/06/2017.

CLINICAL DATA: Initial treatment strategy for left upper lobe mass
on CT, presumed bronchogenic carcinoma.

EXAM:
NUCLEAR MEDICINE PET SKULL BASE TO THIGH
TECHNIQUE: 12.51 mCi F-18 FDG was injected intravenously. Full-ring PET imaging
was performed from the skull base to thigh after the radiotracer. CT
data was obtained and used for attenuation correction and anatomic
localization.
FASTING BLOOD GLUCOSE:  Value: 84 Mg/dl

[Series 3: ct wb 5.0 b30f · axial · 5.0mm · 0.98mm/px · z∈[-1486,-502]mm · 3 of 329 slices shown]
[im 1/329]
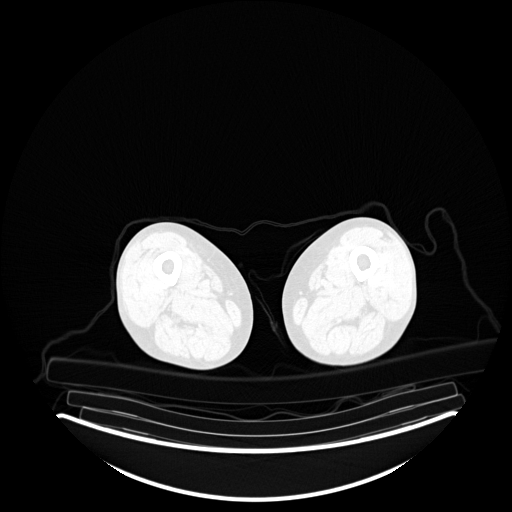
[im 165/329]
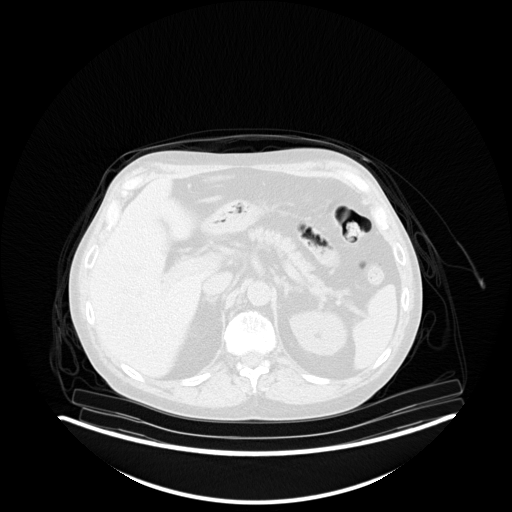
[im 329/329  brain]
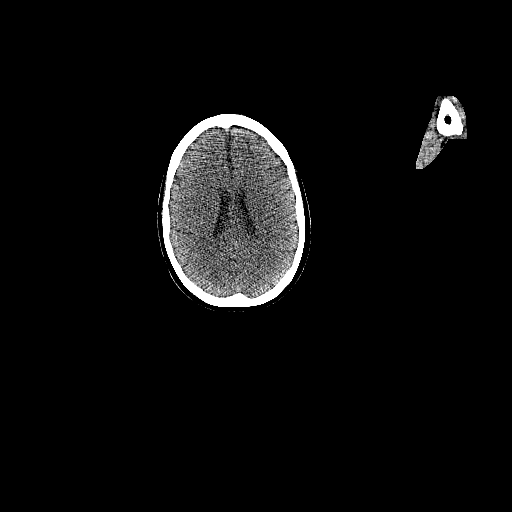

[Series 4: pet wb (ac) · axial · 5.0mm · 2.55mm/px · z∈[-1486,-502]mm · 2 of 329 slices shown]
[im 1/329]
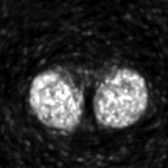
[im 329/329]
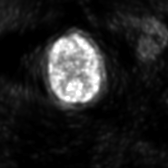

[Series 5: pet wb uncorrected (nac) · axial · 5.0mm · 4.07mm/px · z∈[-1486,-502]mm · 3 of 329 slices shown]
[im 1/329]
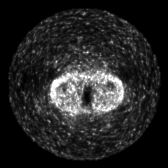
[im 165/329]
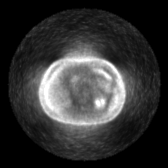
[im 329/329]
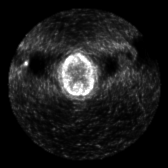

[Series 603: pet axial fused · 3 of 329 slices shown]
[im 1/329]
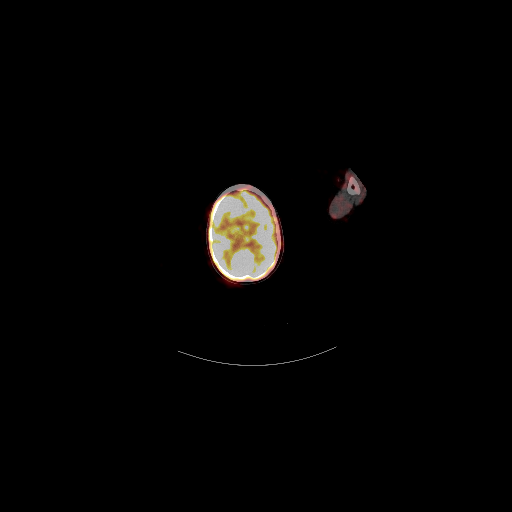
[im 110/329]
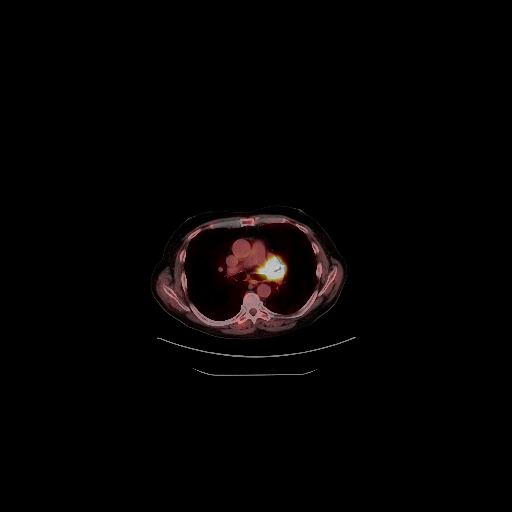
[im 219/329]
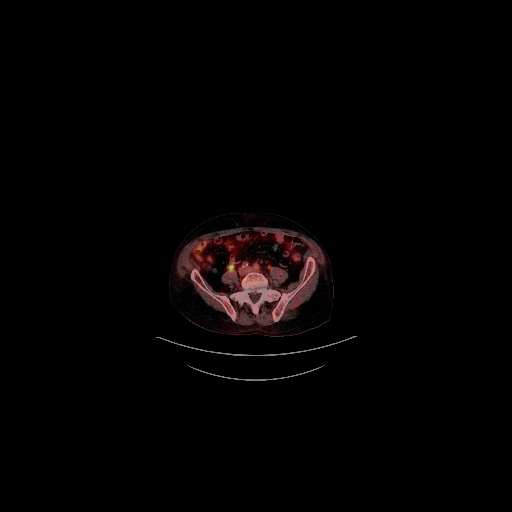

[Series 604: pet coronal fused · 1 of 81 slices shown]
[im 1/81]
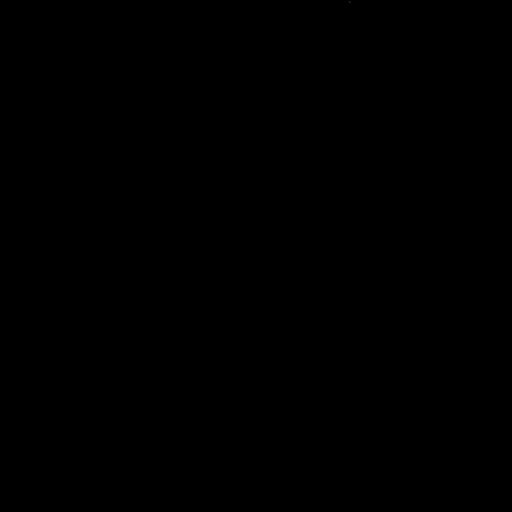

[Series 606: pet sagittal fused · 2 of 152 slices shown]
[im 1/152]
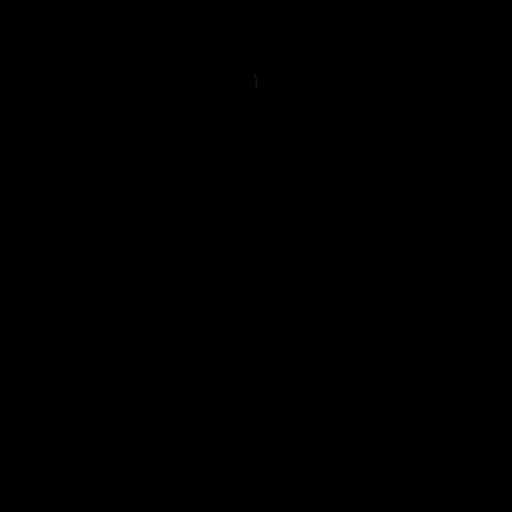
[im 152/152]
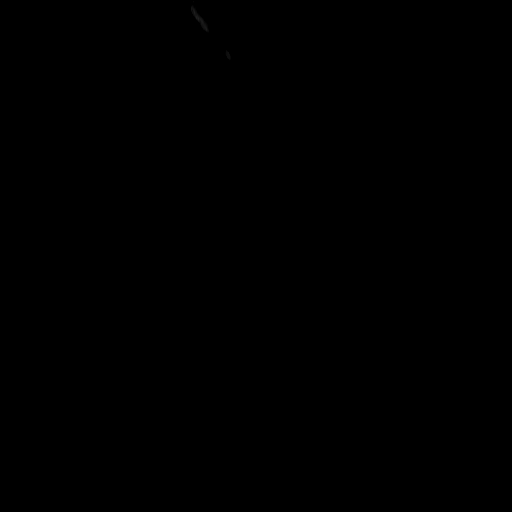

[Series 607: pet axial · 4 of 329 slices shown]
[im 1/329]
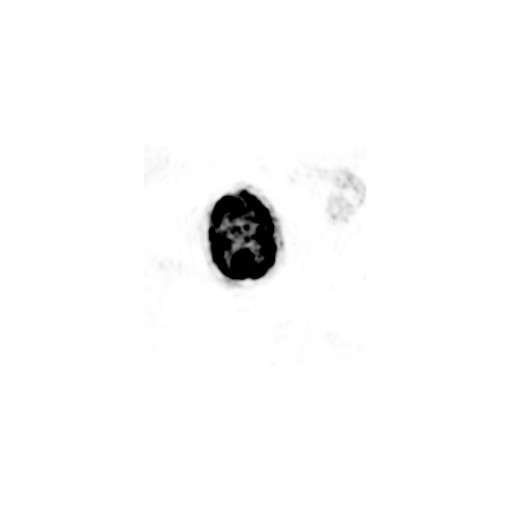
[im 110/329]
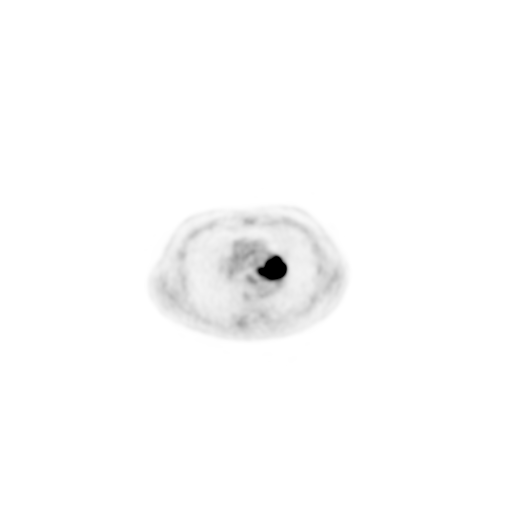
[im 219/329]
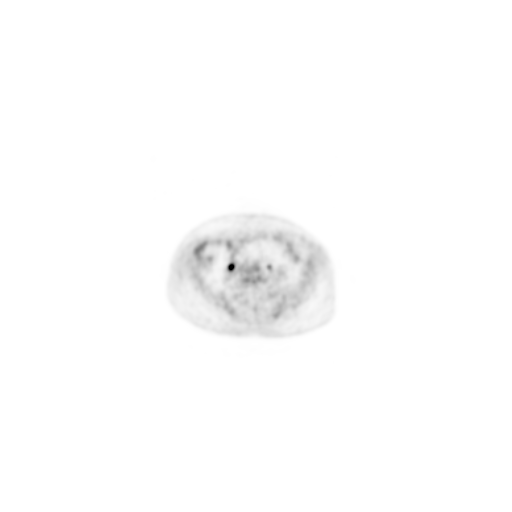
[im 329/329]
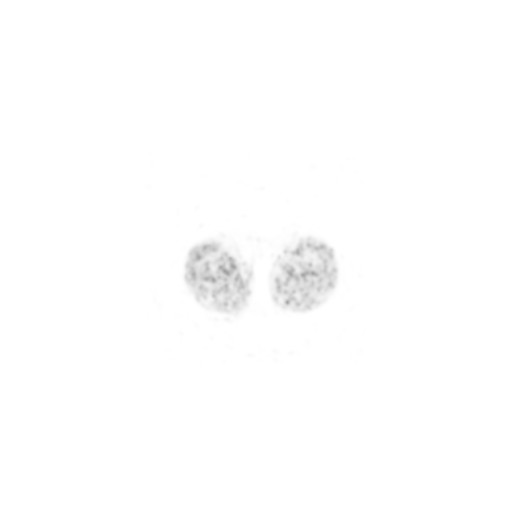

[Series 609: pet sagittal · 2 of 142 slices shown]
[im 1/142]
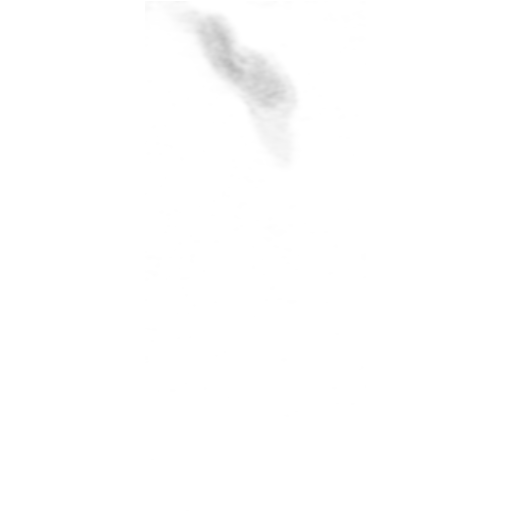
[im 142/142]
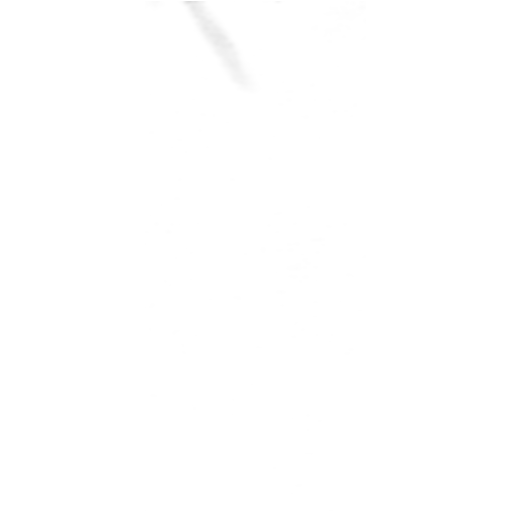

[Series 1031: results mm oncology reading · 1.12mm/px · 1 of 7 slices shown (1 of 2)]
[im 1/7]
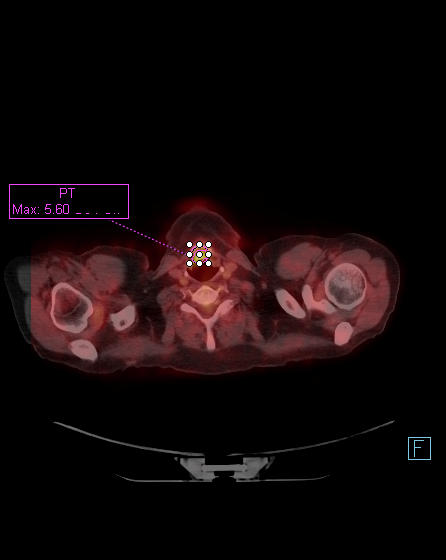

[Series 1088: results mm oncology reading · 0.70mm/px · 1 of 2 slices shown (2 of 2)]
[im 1/2]
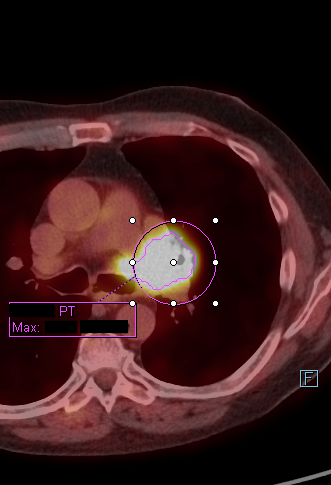

[22 of 25 positions shown; findings below may reference images not displayed]

FINDINGS: NECK

Hypermetabolic left supraclavicular node measures 16 mm on image 74
and has an SUV max of 13.4. No other hypermetabolic cervical lymph
nodes. There is midline hypermetabolic activity involving the base
of the epiglottis. This has an SUV max of 5.6. No focal lesion is
apparent in this area on the CT images.No other lesions of the
pharyngeal mucosal space are seen.

CHEST

The large left hilar mass is markedly hypermetabolic. This has an
SUV max of 22.2 and measures approximately 3.8 x 6.2 cm on image
107. This encases the left upper lobe bronchus and abuts the left
mainstem bronchus to the level of the carina. No endobronchial
component identified. In addition to the left supraclavicular node,
there are small hypermetabolic left paratracheal (SUV max 5.7) and
subcarinal (SUV max 3.4) lymph nodes. There is a small
hypermetabolic inferior right hilar node (SUV max 2.6). There are no
peripheral hypermetabolic nodules within the left lung. There is
stable mild pneumonitis within the left upper lobe. The small
spiculated right upper lobe nodule is hypermetabolic. This nodule
measures 12 x 8 mm on image 97 and has an SUV max of 3.5.

ABDOMEN/PELVIS

There is no hypermetabolic activity within the liver, adrenal
glands, spleen or pancreas. Specifically, there is no abnormal
metabolic activity within the tiny left adrenal nodule. There are no
hypermetabolic lymph nodes in the abdomen or pelvis. CT images
demonstrate mild aortoiliac atherosclerosis.

SKELETON

There is no hypermetabolic activity to suggest osseous metastatic
disease.
IMPRESSION: 1. Hypermetabolic large left hilar mass with hypermetabolic
mediastinal and left supraclavicular lymph nodes, consistent with at
least stage IIIB (T2b N3 M0) bronchogenic carcinoma. The left hilar
mass demonstrates central extension in the AP window, abutting the
left mainstem bronchus to the level of the carina.
2. Contralateral 12 mm hypermetabolic right upper lobe nodule is not
morphologically typical for a metastasis and may reflect a
synchronous primary cancer. Mildly hypermetabolic inferior right
hilar node, nonspecific.
3. No evidence of metastatic disease within the abdomen or pelvis.
4. Indeterminate hypermetabolic activity at the base of the
epiglottis. This could reflect a small mucosal neoplasm or focal
inflammation. Direct visualization recommended.

## 2018-10-04 NOTE — Telephone Encounter (Signed)
Patient already on schedule q3w for lab/fu/tx as requested per 11/21 los.

## 2018-10-20 ENCOUNTER — Other Ambulatory Visit: Payer: Self-pay | Admitting: Internal Medicine

## 2018-10-21 ENCOUNTER — Encounter: Payer: Self-pay | Admitting: Oncology

## 2018-10-21 ENCOUNTER — Telehealth: Payer: Self-pay | Admitting: Oncology

## 2018-10-21 ENCOUNTER — Inpatient Hospital Stay: Payer: 59

## 2018-10-21 ENCOUNTER — Inpatient Hospital Stay (HOSPITAL_BASED_OUTPATIENT_CLINIC_OR_DEPARTMENT_OTHER): Payer: 59 | Admitting: Oncology

## 2018-10-21 ENCOUNTER — Inpatient Hospital Stay: Payer: 59 | Attending: Internal Medicine

## 2018-10-21 VITALS — BP 118/75 | HR 81 | Temp 98.3°F | Resp 21 | Ht 71.0 in | Wt 164.1 lb

## 2018-10-21 DIAGNOSIS — Z95828 Presence of other vascular implants and grafts: Secondary | ICD-10-CM

## 2018-10-21 DIAGNOSIS — I10 Essential (primary) hypertension: Secondary | ICD-10-CM | POA: Insufficient documentation

## 2018-10-21 DIAGNOSIS — L299 Pruritus, unspecified: Secondary | ICD-10-CM | POA: Insufficient documentation

## 2018-10-21 DIAGNOSIS — J449 Chronic obstructive pulmonary disease, unspecified: Secondary | ICD-10-CM | POA: Diagnosis not present

## 2018-10-21 DIAGNOSIS — Z79899 Other long term (current) drug therapy: Secondary | ICD-10-CM | POA: Diagnosis not present

## 2018-10-21 DIAGNOSIS — Z5112 Encounter for antineoplastic immunotherapy: Secondary | ICD-10-CM | POA: Insufficient documentation

## 2018-10-21 DIAGNOSIS — C321 Malignant neoplasm of supraglottis: Secondary | ICD-10-CM | POA: Insufficient documentation

## 2018-10-21 DIAGNOSIS — K219 Gastro-esophageal reflux disease without esophagitis: Secondary | ICD-10-CM | POA: Insufficient documentation

## 2018-10-21 DIAGNOSIS — Z923 Personal history of irradiation: Secondary | ICD-10-CM | POA: Diagnosis not present

## 2018-10-21 DIAGNOSIS — C3412 Malignant neoplasm of upper lobe, left bronchus or lung: Secondary | ICD-10-CM

## 2018-10-21 LAB — CMP (CANCER CENTER ONLY)
ALT: 9 U/L (ref 0–44)
AST: 13 U/L — ABNORMAL LOW (ref 15–41)
Albumin: 3.6 g/dL (ref 3.5–5.0)
Alkaline Phosphatase: 75 U/L (ref 38–126)
Anion gap: 9 (ref 5–15)
BUN: 8 mg/dL (ref 6–20)
CHLORIDE: 105 mmol/L (ref 98–111)
CO2: 28 mmol/L (ref 22–32)
CREATININE: 0.72 mg/dL (ref 0.61–1.24)
Calcium: 9.1 mg/dL (ref 8.9–10.3)
GFR, Est AFR Am: >60 mL/min (ref >60)
GFR, Estimated: >60 mL/min (ref >60)
Glucose, Bld: 120 mg/dL — ABNORMAL HIGH (ref 70–99)
Potassium: 4.1 mmol/L (ref 3.5–5.1)
Sodium: 142 mmol/L (ref 135–145)
TOTAL PROTEIN: 6.4 g/dL — AB (ref 6.5–8.1)
Total Bilirubin: 0.4 mg/dL (ref 0.3–1.2)

## 2018-10-21 LAB — CBC WITH DIFFERENTIAL (CANCER CENTER ONLY)
Abs Immature Granulocytes: 0.04 10*3/uL (ref 0.00–0.07)
BASOS PCT: 1 %
Basophils Absolute: 0 10*3/uL (ref 0.0–0.1)
Eosinophils Absolute: 0.1 10*3/uL (ref 0.0–0.5)
Eosinophils Relative: 1 %
HCT: 45.8 % (ref 39.0–52.0)
Hemoglobin: 15.5 g/dL (ref 13.0–17.0)
Immature Granulocytes: 1 %
LYMPHS ABS: 0.4 10*3/uL — AB (ref 0.7–4.0)
LYMPHS PCT: 7 %
MCH: 32.9 pg (ref 26.0–34.0)
MCHC: 33.8 g/dL (ref 30.0–36.0)
MCV: 97.2 fL (ref 80.0–100.0)
Monocytes Absolute: 0.5 10*3/uL (ref 0.1–1.0)
Monocytes Relative: 8 %
Neutro Abs: 4.8 10*3/uL (ref 1.7–7.7)
Neutrophils Relative %: 82 %
Platelet Count: 203 10*3/uL (ref 150–400)
RBC: 4.71 MIL/uL (ref 4.22–5.81)
RDW: 11.6 % (ref 11.5–15.5)
WBC Count: 5.8 10*3/uL (ref 4.0–10.5)
nRBC: 0 % (ref 0.0–0.2)

## 2018-10-21 LAB — TSH: TSH: 2.008 u[IU]/mL (ref 0.320–4.118)

## 2018-10-21 MED ORDER — SODIUM CHLORIDE 0.9% FLUSH
10.0000 mL | INTRAVENOUS | Status: DC | PRN
  Filled 2018-10-21: qty 10

## 2018-10-21 MED ORDER — SODIUM CHLORIDE 0.9 % IV SOLN
200.0000 mg | Freq: Once | INTRAVENOUS | Status: AC
  Administered 2018-10-21: 200 mg via INTRAVENOUS
  Filled 2018-10-21: qty 8

## 2018-10-21 MED ORDER — SODIUM CHLORIDE 0.9% FLUSH
10.0000 mL | Freq: Once | INTRAVENOUS | Status: AC
  Administered 2018-10-21: 10 mL
  Filled 2018-10-21: qty 10

## 2018-10-21 MED ORDER — SODIUM CHLORIDE 0.9 % IV SOLN
Freq: Once | INTRAVENOUS | Status: AC
  Administered 2018-10-21: 10:00:00 via INTRAVENOUS
  Filled 2018-10-21: qty 250

## 2018-10-21 MED ORDER — HEPARIN SOD (PORK) LOCK FLUSH 100 UNIT/ML IV SOLN
500.0000 [IU] | Freq: Once | INTRAVENOUS | Status: AC | PRN
  Administered 2018-10-21: 500 [IU]
  Filled 2018-10-21: qty 5

## 2018-10-21 NOTE — Telephone Encounter (Signed)
Added another cycles per 12/12 los - pt to get an updated schedule next visit /

## 2018-10-21 NOTE — Progress Notes (Signed)
[facility] OFFICE PROGRESS NOTE  [name], [name], MD [place] [address]  DIAGNOSIS: 1) Stage IIB/IV (T3, N3, M1a) non-small cell lung cancer, squamous cell carcinoma presented with large left hilar mass in addition to mediastinal and left supraclavicular lymphadenopathy as well as contralateral right upper lobe nodule diagnosed in August 2018. PDL 1 expression: 90%. 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 First dose 06/29/2017. Status post 6 cycles. Last dose was given 08/03/2017.  CURRENT THERAPY: Immunotherapy with Keytruda 200 MG IV every 3 weeks, first dose 09/17/2017. Status post 19cycles.  INTERVAL HISTORY: Trevor Zavala 52 y.o. male returns for routine follow-up visit by himself.  The patient is feeling fine and has no specific complaints except for ongoing itching.  He is using Benadryl for this which helps some.  He denies fevers and chills.  Denies chest pain, shortness of breath, cough, hemoptysis.  Denies nausea, vomiting, constipation, diarrhea.  Denies recent weight loss or night sweats.  Denies difficulty swallowing.  He continues to tolerate treatment with [facility] fairly well.  The patient is here for evaluation prior to cycle #20 of Keytruda.  MEDICAL HISTORY: Past Medical History:  Diagnosis Date  . Anxiety   . COPD (chronic obstructive pulmonary disease) ([place])   . GERD (gastroesophageal reflux disease)   . Headache   . Hypertension   . Pneumonia   . Situational depression   . Stage III squamous cell carcinoma of left lung ([place]) 06/19/2017   Lungs & Epiglottis  . Tubular adenoma of colon 01/2015  . Tubular adenoma of colon 01/2015    ALLERGIES:  is allergic to escitalopram oxalate.  MEDICATIONS:  Current Outpatient Medications  Medication Sig Dispense Refill  . clonazePAM (KLONOPIN) 0.5  MG tablet Take 1-2 tablets (0.5-1 mg total) by mouth 2 (two) times daily as needed for anxiety. 180 tablet 2  . diphenhydrAMINE (BENADRYL) 25 MG tablet Take 12.5 mg by mouth every 6 (six) hours as needed for itching.    . docusate sodium (COLACE) 100 MG capsule Take 100 mg by mouth daily as needed.     Marland Kitchen esomeprazole (NEXIUM) 40 MG capsule 1 po qam 90 capsule 3  . fluticasone furoate-vilanterol (BREO ELLIPTA) 100-25 MCG/INH AEPB INHALE 1 PUFF INTO THE LUNGS DAILY. 1 each 11  . Multiple Vitamin (MULTIVITAMIN WITH MINERALS) TABS tablet Take 1 tablet by mouth daily.    . pembrolizumab (KEYTRUDA) 100 MG/4ML SOLN Inject 2 mg/kg into the vein. 200mg  every 3 weeks.    Marland Kitchen acyclovir (ZOVIRAX) 400 MG tablet Take 1 tablet (400 mg total) by mouth 3 (three) times daily. (Patient not taking: Reported on 09/30/2018) 21 tablet 3  . fexofenadine (ALLEGRA ALLERGY) 60 MG tablet Take 1 tablet (60 mg total) by mouth 2 (two) times daily. (Patient not taking: Reported on 09/30/2018) 60 tablet 5  . hydrOXYzine (ATARAX/VISTARIL) 10 MG tablet Take 1 tablet (10 mg total) by mouth 3 (three) times daily as needed. (Patient not taking: Reported on 09/30/2018) 30 tablet 0  . hydrOXYzine (VISTARIL) 25 MG capsule Take 4 capsules (100 mg total) by mouth at bedtime as needed for itching. (Patient not taking: Reported on 09/30/2018) 30 capsule 5   No current facility-administered medications for this visit.     SURGICAL HISTORY:  Past Surgical History:  Procedure Laterality Date  . COLONOSCOPY    . DIRECT  LARYNGOSCOPY N/A 06/25/2017   Procedure: DIRECT LARYNGOSCOPY AND BIOPSY;  Surgeon: Rozetta Nunnery, MD;  Location: [place];  Service: ENT;  Laterality: N/A;  . EAR CYST EXCISION N/A 11/17/2013   Procedure: SEBACEOUS CYST CHEST;  Surgeon: Joyice Faster. Cornett, MD;  Location: [place];  Service: General;  Laterality: N/A;  . IR GASTROSTOMY TUBE MOD SED  08/10/2017  . IR GASTROSTOMY TUBE REMOVAL   09/29/2017  . KNEE ARTHROSCOPY     LEFT  . LIPOMA EXCISION N/A 11/17/2013   Procedure: EXCISION LIPOMA FOREHEAD;  Surgeon: Joyice Faster. Cornett, MD;  Location: [place];  Service: General;  Laterality: N/A;  . LUNG BIOPSY Bilateral 06/12/2017   Procedure: LEFT LUNG BIOPSY;  Surgeon: Grace Isaac, MD;  Location: [place];  Service: Thoracic;  Laterality: Bilateral;  . MICROLARYNGOSCOPY W/VOCAL CORD INJECTION Left 07/16/2018   Procedure: suspened micro laryngoscopy with jet ventilation and prolaryn injection;  Surgeon: Melida Quitter, MD;  Location: [place];  Service: ENT;  Laterality: Left;  . PORTACATH PLACEMENT Right 07/01/2017   Procedure: INSERTION PORT-A-CATH - RIGHT IJ - placed with Fluoro and Ultrasound;  Surgeon: Grace Isaac, MD;  Location: [place];  Service: Thoracic;  Laterality: Right;  Marland Kitchen VIDEO BRONCHOSCOPY WITH ENDOBRONCHIAL ULTRASOUND N/A 06/12/2017   Procedure: VIDEO BRONCHOSCOPY WITH ENDOBRONCHIAL ULTRASOUND;  Surgeon: Grace Isaac, MD;  Location: [place];  Service: Thoracic;  Laterality: N/A;    REVIEW OF SYSTEMS:   Review of Systems  Constitutional: Negative for appetite change, chills, fatigue, fever and unexpected weight change.  HENT:   Negative for mouth sores, nosebleeds, sore throat and trouble swallowing.   Eyes: Negative for eye problems and icterus.  Respiratory: Negative for cough, hemoptysis, shortness of breath and wheezing.   Cardiovascular: Negative for chest pain and leg swelling.  Gastrointestinal: Negative for abdominal pain, constipation, diarrhea, nausea and vomiting.  Genitourinary: Negative for bladder incontinence, difficulty urinating, dysuria, frequency and hematuria.   Musculoskeletal: Negative for back pain, gait problem, neck pain and neck stiffness.  Skin: Positive for itching. Neurological: Negative for dizziness, extremity weakness, gait problem, headaches, light-headedness and seizures.  Hematological: Negative for adenopathy.  Does not bruise/bleed easily.  Psychiatric/Behavioral: Negative for confusion, depression and sleep disturbance. The patient is not nervous/anxious.     PHYSICAL EXAMINATION:  Blood pressure 118/75, pulse 81, temperature 98.3 F (36.8 C), temperature source Oral, resp. rate (!) 21, height 5\' 11"  (1.803 m), weight 164 lb 1.6 oz (74.4 kg), SpO2 97 %.  ECOG PERFORMANCE STATUS: 1 - Symptomatic but completely ambulatory  Physical Exam  Constitutional: Oriented to person, place, and time and well-developed, well-nourished, and in no distress. No distress.  HENT:  Head: Normocephalic and atraumatic.  Mouth/Throat: Oropharynx is clear and moist. No oropharyngeal exudate.  Eyes: Conjunctivae are normal. Right eye exhibits no discharge. Left eye exhibits no discharge. No scleral icterus.  Neck: Normal range of motion. Neck supple.  Cardiovascular: Normal rate, regular rhythm, normal heart sounds and intact distal pulses.   Pulmonary/Chest: Effort normal and breath sounds normal. No respiratory distress. No wheezes. No rales.  Abdominal: Soft. Bowel sounds are normal. Exhibits no distension and no mass. There is no tenderness.  Musculoskeletal: Normal range of motion. Exhibits no edema.  Lymphadenopathy:    No cervical adenopathy.  Neurological: Alert and oriented to person, place, and time. Exhibits normal muscle tone. Gait normal. Coordination normal.  Skin: Skin is warm and dry. No rash noted. Not diaphoretic. No  erythema. No pallor.  Psychiatric: Mood, memory and judgment normal.  Vitals reviewed.  LABORATORY DATA: Lab Results  Component Value Date   WBC 5.8 10/21/2018   HGB 15.5 10/21/2018   HCT 45.8 10/21/2018   MCV 97.2 10/21/2018   PLT 203 10/21/2018      Chemistry      Component Value Date/Time   NA 142 10/21/2018 0803   NA 141 10/29/2017 1049   K 4.1 10/21/2018 0803   K 3.3 (L) 10/29/2017 1049   CL 105 10/21/2018 0803   CO2 28 10/21/2018 0803   CO2 26 10/29/2017 1049    BUN 8 10/21/2018 0803   BUN 11.2 10/29/2017 1049   CREATININE 0.72 10/21/2018 0803   CREATININE 0.7 10/29/2017 1049   GLU 198 08/25/2017      Component Value Date/Time   CALCIUM 9.1 10/21/2018 0803   CALCIUM 9.0 10/29/2017 1049   ALKPHOS 75 10/21/2018 0803   ALKPHOS 82 10/29/2017 1049   AST 13 (L) 10/21/2018 0803   AST 10 10/29/2017 1049   ALT 9 10/21/2018 0803   ALT 8 10/29/2017 1049   BILITOT 0.4 10/21/2018 0803   BILITOT 0.22 10/29/2017 1049       RADIOGRAPHIC STUDIES:  Ct Soft Tissue Neck W Contrast  Result Date: 09/28/2018 CLINICAL DATA:  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 EXAM: CT NECK WITH CONTRAST TECHNIQUE: Multidetector CT imaging of the neck was performed using the standard protocol following the bolus administration of intravenous contrast. CONTRAST:  41mL OMNIPAQUE IOHEXOL 300 MG/ML  SOLN COMPARISON:  07/27/2018 and multiple previous FINDINGS: Pharynx and larynx: No evidence of discernible mucosal or submucosal mass. Post treatment thickening of the epiglottis in the supraglottic mucosal and submucosal tissues. Hyperdensity associated with previous bilateral vocal fold augmentation. Salivary glands: Normal Thyroid: Normal Lymph nodes: No enlarged or low-density nodes on either side of the neck. Vascular: Carotid artery atherosclerosis. Right internal jugular Port-A-Cath. Limited intracranial: Normal Visualized orbits: Normal Mastoids and visualized paranasal sinuses: Clear Skeleton: Normal Upper chest: See results of chest CT. Other: None IMPRESSION: Stable and satisfactory post treatment appearance of the neck. NI-RADS category 1. No evidence of residual or recurrent disease. Post treatment changes of the epiglottis and supraglottic region. Previous vocal fold implants. Electronically Signed   By: Nelson Chimes M.D.   On: 09/28/2018 09:27   Ct Chest W Contrast  Result Date:  09/28/2018 CLINICAL DATA:  Non-small-cell lung cancer, squamous cell carcinoma. Diagnosed in August of 2018. Radiation therapy and chemotherapy completed. On immunotherapy. EXAM: CT CHEST WITH CONTRAST TECHNIQUE: Multidetector CT imaging of the chest was performed during intravenous contrast administration. CONTRAST:  70mL OMNIPAQUE IOHEXOL 300 MG/ML  SOLN COMPARISON:  07/27/2018 FINDINGS: Cardiovascular: Right Port-A-Cath tip at high right atrium. Aortic atherosclerosis. Normal heart size, without pericardial effusion. No central pulmonary embolism, on this non-dedicated study. Mediastinum/Nodes: No mediastinal adenopathy. Similar appearance of soft tissue density in the AP window region, including on image 26/3. Lungs/Pleura: Similar small left pleural effusion. Diffuse, slightly worse on the left bronchial wall thickening is similar. Left upper lobe endobronchial irregularity, including on image 70/4, is not significantly changed. Centrilobular and paraseptal emphysema. Redemonstration of left perihilar and central upper lobe interstitial thickening, likely radiation induced. Subtle increase in soft tissue density surrounding left upper lobe bronchovascular structures. For example image 56/4 today versus 64/6 on the prior. Also coronal image 69 today versus 74 on the prior. Upper Abdomen: Normal  imaged portions of the liver, spleen, pancreas, gallbladder, adrenal glands, kidneys. Proximal gastric underdistention. Musculoskeletal: Midthoracic spondylosis. IMPRESSION: 1. Radiation changes within the central left upper lobe and left perihilar region. Subtle increased soft tissue density surrounding left upper lobe bronchovascular structures may relate to evolving radiation change. Subtle residual or recurrent disease cannot be excluded. This could either be re-evaluated on follow-up or more entirely characterized with PET. 2. Otherwise, no evidence of residual or progressive disease. 3. Similar small left pleural  effusion. 4. Aortic atherosclerosis (ICD10-I70.0) and emphysema (ICD10-J43.9). Electronically Signed   By: Abigail Miyamoto M.D.   On: 09/28/2018 09:25     ASSESSMENT/PLAN:  This is a very pleasant 52 year old white male with recently diagnosed stage IIIB/IV non-small cell lung cancer, adenocarcinoma presented with large left hilar mass in addition to mediastinal and left supraclavicular lymphadenopathy as well as suspicious right upper lobe pulmonary nodule diagnosed in August 2018. 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 He underwent a course of concurrent chemoradiation to the lung as well as the epiglottic area under the care of Dr. Tammi Klippel. He is status post 6 cycle. He tolerated this course of treatment well except for the radiation induced esophagitis as well as weight loss and fatigue. The patient had partial response to the previous treatment. He is currently on treatment with Keytruda 200 mg IV every 3 weeks status post 19cycles. He continues to tolerate this treatment well except for itching.  Recommend for him to proceed with cycle #20 of Keytruda today as scheduled. He will follow-up in 3 weeks for evaluation prior to cycle #21.  For the itching, continue Benadryl.  Recommend that he use moisturizer on a regular basis.  He may also use hydrocortisone cream if needed for the itching.  He was advised to call immediately if he has any concerning symptoms in the interval. The patient was advised to call immediately if he has any concerning symptoms in the interval. The patient voices understanding of current disease status and treatment options and is in agreement with the current care plan. All questions were answered. The patient knows to call the clinic with any problems, questions or concerns. We can certainly see the patient much sooner if necessary.  No orders of the defined types were placed in this encounter.    Mikey Bussing,  DNP, AGPCNP-BC, AOCNP 10/21/18

## 2018-10-21 NOTE — Patient Instructions (Signed)
[facility] Discharge Instructions for Patients Receiving Chemotherapy  Today you received the following chemotherapy agents:  Keytruda.  To help prevent nausea and vomiting after your treatment, we encourage you to take your nausea medication as directed.   If you develop nausea and vomiting that is not controlled by your nausea medication, call the clinic.   BELOW ARE SYMPTOMS THAT SHOULD BE REPORTED IMMEDIATELY:  *FEVER GREATER THAN 100.5 F  *CHILLS WITH OR WITHOUT FEVER  NAUSEA AND VOMITING THAT IS NOT CONTROLLED WITH YOUR NAUSEA MEDICATION  *UNUSUAL SHORTNESS OF BREATH  *UNUSUAL BRUISING OR BLEEDING  TENDERNESS IN MOUTH AND THROAT WITH OR WITHOUT PRESENCE OF ULCERS  *URINARY PROBLEMS  *BOWEL PROBLEMS  UNUSUAL RASH Items with * indicate a potential emergency and should be followed up as soon as possible.  Feel free to call the clinic should you have any questions or concerns. The clinic phone number is [phone].  Please show the CHEMO ALERT CARD at check-in to the Emergency Department and triage nurse.    

## 2018-10-25 ENCOUNTER — Telehealth: Payer: Self-pay | Admitting: Gastroenterology

## 2018-10-25 NOTE — Telephone Encounter (Signed)
New instructions mailed to pt as his requested. Address verified. Pt notified.

## 2018-10-25 NOTE — Telephone Encounter (Signed)
Patient rescheduled colon appt to 1/17 @ 2:00pm would like a new set pf instructions mailed to him

## 2018-10-26 NOTE — Telephone Encounter (Signed)
LVM for patient to call back and make appt, patient is due for CPE on or after 01/28/19, please schedule follow up or a CPE with PCP, please send message once appt is made so we can send in the refill

## 2018-10-26 NOTE — Telephone Encounter (Signed)
Please call pt to schedule OV

## 2018-10-26 NOTE — Telephone Encounter (Signed)
sch OV 

## 2018-10-28 ENCOUNTER — Encounter: Payer: Managed Care, Other (non HMO) | Admitting: Gastroenterology

## 2018-11-11 ENCOUNTER — Inpatient Hospital Stay (HOSPITAL_BASED_OUTPATIENT_CLINIC_OR_DEPARTMENT_OTHER): Payer: 59 | Admitting: Internal Medicine

## 2018-11-11 ENCOUNTER — Inpatient Hospital Stay: Payer: 59

## 2018-11-11 ENCOUNTER — Encounter: Payer: Self-pay | Admitting: Internal Medicine

## 2018-11-11 ENCOUNTER — Inpatient Hospital Stay: Payer: 59 | Attending: Internal Medicine

## 2018-11-11 VITALS — BP 119/79 | HR 82 | Temp 98.5°F | Resp 18 | Ht 71.0 in | Wt 161.8 lb

## 2018-11-11 DIAGNOSIS — C321 Malignant neoplasm of supraglottis: Secondary | ICD-10-CM

## 2018-11-11 DIAGNOSIS — I10 Essential (primary) hypertension: Secondary | ICD-10-CM | POA: Diagnosis not present

## 2018-11-11 DIAGNOSIS — K219 Gastro-esophageal reflux disease without esophagitis: Secondary | ICD-10-CM | POA: Insufficient documentation

## 2018-11-11 DIAGNOSIS — Z79899 Other long term (current) drug therapy: Secondary | ICD-10-CM

## 2018-11-11 DIAGNOSIS — Z923 Personal history of irradiation: Secondary | ICD-10-CM

## 2018-11-11 DIAGNOSIS — Z5112 Encounter for antineoplastic immunotherapy: Secondary | ICD-10-CM

## 2018-11-11 DIAGNOSIS — C3412 Malignant neoplasm of upper lobe, left bronchus or lung: Secondary | ICD-10-CM

## 2018-11-11 DIAGNOSIS — Z9221 Personal history of antineoplastic chemotherapy: Secondary | ICD-10-CM | POA: Diagnosis not present

## 2018-11-11 DIAGNOSIS — R490 Dysphonia: Secondary | ICD-10-CM | POA: Diagnosis not present

## 2018-11-11 DIAGNOSIS — R591 Generalized enlarged lymph nodes: Secondary | ICD-10-CM | POA: Insufficient documentation

## 2018-11-11 DIAGNOSIS — J449 Chronic obstructive pulmonary disease, unspecified: Secondary | ICD-10-CM

## 2018-11-11 DIAGNOSIS — C3490 Malignant neoplasm of unspecified part of unspecified bronchus or lung: Secondary | ICD-10-CM

## 2018-11-11 DIAGNOSIS — Z95828 Presence of other vascular implants and grafts: Secondary | ICD-10-CM

## 2018-11-11 LAB — CMP (CANCER CENTER ONLY)
ALBUMIN: 3.5 g/dL (ref 3.5–5.0)
ALT: 11 U/L (ref 0–44)
AST: 12 U/L — ABNORMAL LOW (ref 15–41)
Alkaline Phosphatase: 67 U/L (ref 38–126)
Anion gap: 9 (ref 5–15)
BUN: 9 mg/dL (ref 6–20)
CO2: 25 mmol/L (ref 22–32)
Calcium: 9.1 mg/dL (ref 8.9–10.3)
Chloride: 107 mmol/L (ref 98–111)
Creatinine: 0.71 mg/dL (ref 0.61–1.24)
GFR, Est AFR Am: >60 mL/min (ref >60)
GFR, Estimated: >60 mL/min (ref >60)
GLUCOSE: 106 mg/dL — AB (ref 70–99)
Potassium: 4 mmol/L (ref 3.5–5.1)
Sodium: 141 mmol/L (ref 135–145)
Total Bilirubin: 0.5 mg/dL (ref 0.3–1.2)
Total Protein: 6.3 g/dL — ABNORMAL LOW (ref 6.5–8.1)

## 2018-11-11 LAB — CBC WITH DIFFERENTIAL (CANCER CENTER ONLY)
Abs Immature Granulocytes: 0.02 10*3/uL (ref 0.00–0.07)
Basophils Absolute: 0 10*3/uL (ref 0.0–0.1)
Basophils Relative: 1 %
Eosinophils Absolute: 0 10*3/uL (ref 0.0–0.5)
Eosinophils Relative: 1 %
HCT: 46.5 % (ref 39.0–52.0)
Hemoglobin: 15.5 g/dL (ref 13.0–17.0)
Immature Granulocytes: 0 %
Lymphocytes Relative: 10 %
Lymphs Abs: 0.5 10*3/uL — ABNORMAL LOW (ref 0.7–4.0)
MCH: 32.2 pg (ref 26.0–34.0)
MCHC: 33.3 g/dL (ref 30.0–36.0)
MCV: 96.7 fL (ref 80.0–100.0)
Monocytes Absolute: 0.6 10*3/uL (ref 0.1–1.0)
Monocytes Relative: 12 %
NEUTROS ABS: 3.6 10*3/uL (ref 1.7–7.7)
Neutrophils Relative %: 76 %
PLATELETS: 193 10*3/uL (ref 150–400)
RBC: 4.81 MIL/uL (ref 4.22–5.81)
RDW: 11.5 % (ref 11.5–15.5)
WBC Count: 4.7 10*3/uL (ref 4.0–10.5)
nRBC: 0 % (ref 0.0–0.2)

## 2018-11-11 LAB — TSH: TSH: 4.345 u[IU]/mL — ABNORMAL HIGH (ref 0.320–4.118)

## 2018-11-11 MED ORDER — SODIUM CHLORIDE 0.9 % IV SOLN
200.0000 mg | Freq: Once | INTRAVENOUS | Status: AC
  Administered 2018-11-11: 200 mg via INTRAVENOUS
  Filled 2018-11-11: qty 8

## 2018-11-11 MED ORDER — SODIUM CHLORIDE 0.9 % IV SOLN
Freq: Once | INTRAVENOUS | Status: AC
  Administered 2018-11-11: 09:00:00 via INTRAVENOUS
  Filled 2018-11-11: qty 250

## 2018-11-11 MED ORDER — SODIUM CHLORIDE 0.9% FLUSH
10.0000 mL | INTRAVENOUS | Status: DC | PRN
  Administered 2018-11-11: 10 mL
  Filled 2018-11-11: qty 10

## 2018-11-11 MED ORDER — SODIUM CHLORIDE 0.9% FLUSH
10.0000 mL | Freq: Once | INTRAVENOUS | Status: AC
  Administered 2018-11-11: 10 mL
  Filled 2018-11-11: qty 10

## 2018-11-11 MED ORDER — HEPARIN SOD (PORK) LOCK FLUSH 100 UNIT/ML IV SOLN
500.0000 [IU] | Freq: Once | INTRAVENOUS | Status: AC | PRN
  Administered 2018-11-11: 500 [IU]
  Filled 2018-11-11: qty 5

## 2018-11-11 NOTE — Progress Notes (Signed)
[facility] [phone]   [phone]  OFFICE PROGRESS NOTE  [name], [name], MD [place] [address]  DIAGNOSIS:  1) Stage IIB/IV (T3, N3, M1a) non-small cell lung cancer, squamous cell carcinoma presented with large left hilar mass in addition to mediastinal and left supraclavicular lymphadenopathy as well as contralateral right upper lobe nodule diagnosed in August 2018. PDL 1 expression: 90%. 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 First dose 06/29/2017. Status post 6 cycles. Last dose was given 08/03/2017.  CURRENT THERAPY: Immunotherapy with Ketruda 200 MG IV every 3 weeks, first dose 09/17/2017.  Status post 20 cycles.  INTERVAL HISTORY: Trevor Zavala 53 y.o. male returns to the clinic today for follow-up visit.  The patient is feeling fine today with no concerning complaints.  He had 1 day of nausea and diarrhea after his last chemotherapy but recovered quickly.  He denied having any current chest pain, shortness of breath, cough or hemoptysis.  He denied having any current nausea, vomiting, diarrhea or constipation.  He continues to have the hoarseness of his voice.  He has been tolerating his treatment with Keytruda fairly well.  The patient is here today for evaluation before starting cycle #21.  MEDICAL HISTORY: Past Medical History:  Diagnosis Date  . Anxiety   . COPD (chronic obstructive pulmonary disease) ([place])   . GERD (gastroesophageal reflux disease)   . Headache   . Hypertension   . Pneumonia   . Situational depression   . Stage III squamous cell carcinoma of left lung ([place]) 06/19/2017   Lungs & Epiglottis  . Tubular adenoma of colon 01/2015  . Tubular adenoma of colon 01/2015    ALLERGIES:  is allergic to escitalopram oxalate.  MEDICATIONS:  Current Outpatient  Medications  Medication Sig Dispense Refill  . acyclovir (ZOVIRAX) 400 MG tablet Take 1 tablet (400 mg total) by mouth 3 (three) times daily. (Patient not taking: Reported on 09/30/2018) 21 tablet 3  . clonazePAM (KLONOPIN) 0.5 MG tablet TAKE 1 TO 2 TABLETS        (0.5-1MG  TOTAL) TWICE A DAYAS NEEDED FOR ANXIETY 180 tablet 1  . diphenhydrAMINE (BENADRYL) 25 MG tablet Take 12.5 mg by mouth every 6 (six) hours as needed for itching.    . docusate sodium (COLACE) 100 MG capsule Take 100 mg by mouth daily as needed.     Marland Kitchen esomeprazole (NEXIUM) 40 MG capsule 1 po qam 90 capsule 3  . fexofenadine (ALLEGRA ALLERGY) 60 MG tablet Take 1 tablet (60 mg total) by mouth 2 (two) times daily. (Patient not taking: Reported on 09/30/2018) 60 tablet 5  . fluticasone furoate-vilanterol (BREO ELLIPTA) 100-25 MCG/INH AEPB INHALE 1 PUFF INTO THE LUNGS DAILY. 1 each 11  . hydrOXYzine (ATARAX/VISTARIL) 10 MG tablet Take 1 tablet (10 mg total) by mouth 3 (three) times daily as needed. (Patient not taking: Reported on 09/30/2018) 30 tablet 0  . hydrOXYzine (VISTARIL) 25 MG capsule Take 4 capsules (100 mg total) by mouth at bedtime as needed for itching. (Patient not taking: Reported on 09/30/2018) 30 capsule 5  . Multiple Vitamin (MULTIVITAMIN WITH MINERALS) TABS tablet Take 1 tablet by mouth daily.    . pembrolizumab (KEYTRUDA) 100 MG/4ML SOLN Inject 2 mg/kg into the vein. 200mg  every 3 weeks.     No current facility-administered medications for this  visit.     SURGICAL HISTORY:  Past Surgical History:  Procedure Laterality Date  . COLONOSCOPY    . DIRECT LARYNGOSCOPY N/A 06/25/2017   Procedure: DIRECT LARYNGOSCOPY AND BIOPSY;  Surgeon: Rozetta Nunnery, MD;  Location: [place];  Service: ENT;  Laterality: N/A;  . EAR CYST EXCISION N/A 11/17/2013   Procedure: SEBACEOUS CYST CHEST;  Surgeon: Joyice Faster. Cornett, MD;  Location: [place];  Service: General;  Laterality: N/A;  . IR  GASTROSTOMY TUBE MOD SED  08/10/2017  . IR GASTROSTOMY TUBE REMOVAL  09/29/2017  . KNEE ARTHROSCOPY     LEFT  . LIPOMA EXCISION N/A 11/17/2013   Procedure: EXCISION LIPOMA FOREHEAD;  Surgeon: Joyice Faster. Cornett, MD;  Location: [place];  Service: General;  Laterality: N/A;  . LUNG BIOPSY Bilateral 06/12/2017   Procedure: LEFT LUNG BIOPSY;  Surgeon: Grace Isaac, MD;  Location: [place];  Service: Thoracic;  Laterality: Bilateral;  . MICROLARYNGOSCOPY W/VOCAL CORD INJECTION Left 07/16/2018   Procedure: suspened micro laryngoscopy with jet ventilation and prolaryn injection;  Surgeon: Melida Quitter, MD;  Location: [place];  Service: ENT;  Laterality: Left;  . PORTACATH PLACEMENT Right 07/01/2017   Procedure: INSERTION PORT-A-CATH - RIGHT IJ - placed with Fluoro and Ultrasound;  Surgeon: Grace Isaac, MD;  Location: [place];  Service: Thoracic;  Laterality: Right;  Marland Kitchen VIDEO BRONCHOSCOPY WITH ENDOBRONCHIAL ULTRASOUND N/A 06/12/2017   Procedure: VIDEO BRONCHOSCOPY WITH ENDOBRONCHIAL ULTRASOUND;  Surgeon: Grace Isaac, MD;  Location: [place];  Service: Thoracic;  Laterality: N/A;    REVIEW OF SYSTEMS:  A comprehensive review of systems was negative except for: Ears, nose, mouth, throat, and face: positive for hoarseness   PHYSICAL EXAMINATION: General appearance: alert, cooperative and no distress Head: Normocephalic, without obvious abnormality, atraumatic Neck: no adenopathy, no JVD, supple, symmetrical, trachea midline and thyroid not enlarged, symmetric, no tenderness/mass/nodules Lymph nodes: Cervical, supraclavicular, and axillary nodes normal. Resp: clear to auscultation bilaterally Back: symmetric, no curvature. ROM normal. No CVA tenderness. Cardio: regular rate and rhythm, S1, S2 normal, no murmur, click, rub or gallop GI: soft, non-tender; bowel sounds normal; no masses,  no organomegaly Extremities: extremities normal, atraumatic, no cyanosis or edema  ECOG PERFORMANCE  STATUS: 1 - Symptomatic but completely ambulatory  Blood pressure 119/79, pulse 82, temperature 98.5 F (36.9 C), temperature source Oral, resp. rate 18, height 5\' 11"  (1.803 m), weight 161 lb 12.8 oz (73.4 kg), SpO2 94 %.  LABORATORY DATA: Lab Results  Component Value Date   WBC 4.7 11/11/2018   HGB 15.5 11/11/2018   HCT 46.5 11/11/2018   MCV 96.7 11/11/2018   PLT 193 11/11/2018      Chemistry      Component Value Date/Time   NA 142 10/21/2018 0803   NA 141 10/29/2017 1049   K 4.1 10/21/2018 0803   K 3.3 (L) 10/29/2017 1049   CL 105 10/21/2018 0803   CO2 28 10/21/2018 0803   CO2 26 10/29/2017 1049   BUN 8 10/21/2018 0803   BUN 11.2 10/29/2017 1049   CREATININE 0.72 10/21/2018 0803   CREATININE 0.7 10/29/2017 1049   GLU 198 08/25/2017      Component Value Date/Time   CALCIUM 9.1 10/21/2018 0803   CALCIUM 9.0 10/29/2017 1049   ALKPHOS 75 10/21/2018 0803   ALKPHOS 82 10/29/2017 1049   AST 13 (L) 10/21/2018 0803   AST 10 10/29/2017 1049   ALT 9 10/21/2018 0803  ALT 8 10/29/2017 1049   BILITOT 0.4 10/21/2018 0803   BILITOT 0.22 10/29/2017 1049       RADIOGRAPHIC STUDIES: No results found.  ASSESSMENT AND PLAN: This is a very pleasant 53 years old white male with recently diagnosed stage IIIB/IV non-small cell lung cancer, adenocarcinoma presented with large left hilar mass in addition to mediastinal and left supraclavicular lymphadenopathy as well as suspicious right upper lobe pulmonary nodule diagnosed in August 2018. 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 He underwent a course of concurrent chemoradiation to the lung as well as the epiglottic area under the care of Dr. Tammi Klippel. He is status post 6 cycle. He tolerated this course of treatment well except for the radiation induced esophagitis as well as weight loss and fatigue. The patient had partial response to the previous treatment. He is currently on treatment  with Keytruda 200 mg IV every 3 weeks status post 20 cycles. The patient has been tolerating this treatment well with no concerning adverse effects. I recommended for him to proceed with cycle #21 today as scheduled. I will see him back for follow-up visit in 3 weeks for evaluation after repeating CT scan of the neck and chest for restaging of his disease. The patient was advised to call immediately if he has any concerning symptoms in the interval. The patient voices understanding of current disease status and treatment options and is in agreement with the current care plan. All questions were answered. The patient knows to call the clinic with any problems, questions or concerns. We can certainly see the patient much sooner if necessary. Disclaimer: This note was dictated with voice recognition software. Similar sounding words can inadvertently be transcribed and may not be corrected upon review.

## 2018-11-11 NOTE — Patient Instructions (Signed)
[facility] Discharge Instructions for Patients Receiving Chemotherapy  Today you received the following chemotherapy agents:  Keytruda.  To help prevent nausea and vomiting after your treatment, we encourage you to take your nausea medication as directed.   If you develop nausea and vomiting that is not controlled by your nausea medication, call the clinic.   BELOW ARE SYMPTOMS THAT SHOULD BE REPORTED IMMEDIATELY:  *FEVER GREATER THAN 100.5 F  *CHILLS WITH OR WITHOUT FEVER  NAUSEA AND VOMITING THAT IS NOT CONTROLLED WITH YOUR NAUSEA MEDICATION  *UNUSUAL SHORTNESS OF BREATH  *UNUSUAL BRUISING OR BLEEDING  TENDERNESS IN MOUTH AND THROAT WITH OR WITHOUT PRESENCE OF ULCERS  *URINARY PROBLEMS  *BOWEL PROBLEMS  UNUSUAL RASH Items with * indicate a potential emergency and should be followed up as soon as possible.  Feel free to call the clinic should you have any questions or concerns. The clinic phone number is [phone].  Please show the CHEMO ALERT CARD at check-in to the Emergency Department and triage nurse.    

## 2018-11-15 ENCOUNTER — Telehealth: Payer: Self-pay | Admitting: Internal Medicine

## 2018-11-15 NOTE — Telephone Encounter (Signed)
Scheduled appt per 1/2 los - pt to get an updated schedule next visit.

## 2018-11-18 ENCOUNTER — Telehealth: Payer: Self-pay | Admitting: Internal Medicine

## 2018-11-18 NOTE — Telephone Encounter (Signed)
Copied from [place] [id number]. Topic: Quick Communication - Rx Refill/Question >> Nov 18, 2018 10:22 AM Trevor Zavala wrote: Medication:    fluticasone furoate-vilanterol (BREO ELLIPTA) 100-25 MCG/INH AEPB pt need coupon to go with this medicine to make it $15     Has the patient contacted their pharmacy? Yes.   (Agent: If no, request that the patient contact the pharmacy for the refill.) (Agent: If yes, when and what did the pharmacy advise?) two days ago  pt need the coupon   Preferred Pharmacy (with phone number or street name):     [id number] - [place], [place] [address] [phone] (Phone) [phone] (Fax)    Agent: Please be advised that RX refills may take up to 3 business days. We ask that you follow-up with your pharmacy.

## 2018-11-18 NOTE — Telephone Encounter (Signed)
If any question call Tameem at [phone] cell number

## 2018-11-18 NOTE — Telephone Encounter (Signed)
Pt given copay card

## 2018-11-25 ENCOUNTER — Encounter: Payer: Self-pay | Admitting: Internal Medicine

## 2018-11-25 ENCOUNTER — Ambulatory Visit (INDEPENDENT_AMBULATORY_CARE_PROVIDER_SITE_OTHER): Payer: 59 | Admitting: Internal Medicine

## 2018-11-25 DIAGNOSIS — F411 Generalized anxiety disorder: Secondary | ICD-10-CM

## 2018-11-25 DIAGNOSIS — J449 Chronic obstructive pulmonary disease, unspecified: Secondary | ICD-10-CM

## 2018-11-25 DIAGNOSIS — R634 Abnormal weight loss: Secondary | ICD-10-CM

## 2018-11-25 DIAGNOSIS — I10 Essential (primary) hypertension: Secondary | ICD-10-CM

## 2018-11-25 DIAGNOSIS — K219 Gastro-esophageal reflux disease without esophagitis: Secondary | ICD-10-CM | POA: Diagnosis not present

## 2018-11-25 DIAGNOSIS — C3412 Malignant neoplasm of upper lobe, left bronchus or lung: Secondary | ICD-10-CM

## 2018-11-25 MED ORDER — CLONAZEPAM 0.5 MG PO TABS: ORAL_TABLET | ORAL | 1 refills | Status: DC

## 2018-11-25 MED ORDER — ACYCLOVIR 400 MG PO TABS: 400.0000 mg | ORAL_TABLET | Freq: Three times a day (TID) | ORAL | 3 refills | Status: DC

## 2018-11-25 MED ORDER — FLUTICASONE FUROATE-VILANTEROL 100-25 MCG/INH IN AEPB: INHALATION_SPRAY | RESPIRATORY_TRACT | 3 refills | Status: DC

## 2018-11-25 MED ORDER — ESOMEPRAZOLE MAGNESIUM 40 MG PO CPDR: DELAYED_RELEASE_CAPSULE | ORAL | 3 refills | Status: DC

## 2018-11-25 NOTE — Assessment & Plan Note (Signed)
Off meds 

## 2018-11-25 NOTE — Assessment & Plan Note (Signed)
Wt Readings from Last 3 Encounters:  11/25/18 161 lb (73 kg)  11/11/18 161 lb 12.8 oz (73.4 kg)  10/21/18 164 lb 1.6 oz (74.4 kg)

## 2018-11-25 NOTE — Progress Notes (Signed)
Subjective:  Patient ID: Trevor Zavala, male    DOB: 10/06/1966  Age: 53 y.o. MRN: [id number]  CC: No chief complaint on file.   HPI Trevor Zavala presents for COPD, anxiety, lung/epiglottis cancer f/u Refused all shots  Outpatient Medications Prior to Visit  Medication Sig Dispense Refill  . clonazePAM (KLONOPIN) 0.5 MG tablet TAKE 1 TO 2 TABLETS        (0.5-1MG  TOTAL) TWICE A DAYAS NEEDED FOR ANXIETY 180 tablet 1  . diphenhydrAMINE (BENADRYL) 25 MG tablet Take 12.5 mg by mouth every 6 (six) hours as needed for itching.    . esomeprazole (NEXIUM) 40 MG capsule 1 po qam 90 capsule 3  . fluticasone furoate-vilanterol (BREO ELLIPTA) 100-25 MCG/INH AEPB INHALE 1 PUFF INTO THE LUNGS DAILY. 1 each 11  . Multiple Vitamin (MULTIVITAMIN WITH MINERALS) TABS tablet Take 1 tablet by mouth daily.    . pembrolizumab (KEYTRUDA) 100 MG/4ML SOLN Inject 2 mg/kg into the vein. 200mg  every 3 weeks.    Marland Kitchen acyclovir (ZOVIRAX) 400 MG tablet Take 1 tablet (400 mg total) by mouth 3 (three) times daily. (Patient not taking: Reported on 09/30/2018) 21 tablet 3  . docusate sodium (COLACE) 100 MG capsule Take 100 mg by mouth daily as needed.     . fexofenadine (ALLEGRA ALLERGY) 60 MG tablet Take 1 tablet (60 mg total) by mouth 2 (two) times daily. (Patient not taking: Reported on 09/30/2018) 60 tablet 5  . hydrOXYzine (ATARAX/VISTARIL) 10 MG tablet Take 1 tablet (10 mg total) by mouth 3 (three) times daily as needed. (Patient not taking: Reported on 09/30/2018) 30 tablet 0  . hydrOXYzine (VISTARIL) 25 MG capsule Take 4 capsules (100 mg total) by mouth at bedtime as needed for itching. (Patient not taking: Reported on 09/30/2018) 30 capsule 5   No facility-administered medications prior to visit.     ROS: Review of Systems  Constitutional: Positive for fatigue. Negative for appetite change and unexpected weight change.  HENT: Negative for congestion, nosebleeds, sneezing, sore throat and trouble swallowing.     Eyes: Negative for itching and visual disturbance.  Respiratory: Positive for cough.   Cardiovascular: Negative for chest pain, palpitations and leg swelling.  Gastrointestinal: Negative for abdominal distention, blood in stool, diarrhea and nausea.  Genitourinary: Negative for frequency and hematuria.  Musculoskeletal: Negative for back pain, gait problem, joint swelling and neck pain.  Skin: Negative for rash.  Neurological: Negative for dizziness, tremors, speech difficulty and weakness.  Psychiatric/Behavioral: Negative for agitation, dysphoric mood, sleep disturbance and suicidal ideas. The patient is nervous/anxious.     Objective:  BP 118/80 (BP Location: Left Arm, Patient Position: Sitting, Cuff Size: Normal)   Pulse 81   Temp 98.4 F (36.9 C) (Oral)   Ht 5\' 11"  (1.803 m)   Wt 161 lb (73 kg)   SpO2 95%   BMI 22.45 kg/m   BP Readings from Last 3 Encounters:  11/25/18 118/80  11/11/18 119/79  10/21/18 118/75    Wt Readings from Last 3 Encounters:  11/25/18 161 lb (73 kg)  11/11/18 161 lb 12.8 oz (73.4 kg)  10/21/18 164 lb 1.6 oz (74.4 kg)    Physical Exam Constitutional:      General: He is not in acute distress.    Appearance: He is well-developed.     Comments: NAD  Eyes:     Conjunctiva/sclera: Conjunctivae normal.     Pupils: Pupils are equal, round, and reactive to light.  Neck:  Musculoskeletal: Normal range of motion.     Thyroid: No thyromegaly.     Vascular: No JVD.  Cardiovascular:     Rate and Rhythm: Normal rate and regular rhythm.     Heart sounds: Normal heart sounds. No murmur. No friction rub. No gallop.   Pulmonary:     Effort: Pulmonary effort is normal. No respiratory distress.     Breath sounds: Normal breath sounds. No wheezing or rales.  Chest:     Chest wall: No tenderness.  Abdominal:     General: Bowel sounds are normal. There is no distension.     Palpations: Abdomen is soft. There is no mass.     Tenderness: There is no  abdominal tenderness. There is no guarding or rebound.  Musculoskeletal: Normal range of motion.        General: No tenderness.  Lymphadenopathy:     Cervical: No cervical adenopathy.  Skin:    General: Skin is warm and dry.     Findings: No rash.  Neurological:     Mental Status: He is alert and oriented to person, place, and time.     Cranial Nerves: No cranial nerve deficit.     Motor: No abnormal muscle tone.     Coordination: Coordination normal.     Gait: Gait normal.     Deep Tendon Reflexes: Reflexes are normal and symmetric.  Psychiatric:        Behavior: Behavior normal.        Thought Content: Thought content normal.        Judgment: Judgment normal.   hoarse  Lab Results  Component Value Date   WBC 4.7 11/11/2018   HGB 15.5 11/11/2018   HCT 46.5 11/11/2018   PLT 193 11/11/2018   GLUCOSE 106 (H) 11/11/2018   CHOL 174 11/23/2014   TRIG 93.0 11/23/2014   HDL 52.50 11/23/2014   LDLDIRECT 91.6 08/19/2010   LDLCALC 103 (H) 11/23/2014   ALT 11 11/11/2018   AST 12 (L) 11/11/2018   NA 141 11/11/2018   K 4.0 11/11/2018   CL 107 11/11/2018   CREATININE 0.71 11/11/2018   BUN 9 11/11/2018   CO2 25 11/11/2018   TSH 4.345 (H) 11/11/2018   PSA 1.24 11/23/2014   INR 1.14 08/13/2017   HGBA1C 6.4 (H) 08/17/2017    Ct Soft Tissue Neck W Contrast  Result Date: 09/28/2018 CLINICAL DATA:  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 EXAM: CT NECK WITH CONTRAST TECHNIQUE: Multidetector CT imaging of the neck was performed using the standard protocol following the bolus administration of intravenous contrast. CONTRAST:  44mL OMNIPAQUE IOHEXOL 300 MG/ML  SOLN COMPARISON:  07/27/2018 and multiple previous FINDINGS: Pharynx and larynx: No evidence of discernible mucosal or submucosal mass. Post treatment thickening of the epiglottis in the supraglottic mucosal and submucosal tissues. Hyperdensity associated  with previous bilateral vocal fold augmentation. Salivary glands: Normal Thyroid: Normal Lymph nodes: No enlarged or low-density nodes on either side of the neck. Vascular: Carotid artery atherosclerosis. Right internal jugular Port-A-Cath. Limited intracranial: Normal Visualized orbits: Normal Mastoids and visualized paranasal sinuses: Clear Skeleton: Normal Upper chest: See results of chest CT. Other: None IMPRESSION: Stable and satisfactory post treatment appearance of the neck. NI-RADS category 1. No evidence of residual or recurrent disease. Post treatment changes of the epiglottis and supraglottic region. Previous vocal fold implants. Electronically Signed   By: Nelson Chimes M.D.   On: 09/28/2018  09:27   Ct Chest W Contrast  Result Date: 09/28/2018 CLINICAL DATA:  Non-small-cell lung cancer, squamous cell carcinoma. Diagnosed in August of 2018. Radiation therapy and chemotherapy completed. On immunotherapy. EXAM: CT CHEST WITH CONTRAST TECHNIQUE: Multidetector CT imaging of the chest was performed during intravenous contrast administration. CONTRAST:  44mL OMNIPAQUE IOHEXOL 300 MG/ML  SOLN COMPARISON:  07/27/2018 FINDINGS: Cardiovascular: Right Port-A-Cath tip at high right atrium. Aortic atherosclerosis. Normal heart size, without pericardial effusion. No central pulmonary embolism, on this non-dedicated study. Mediastinum/Nodes: No mediastinal adenopathy. Similar appearance of soft tissue density in the AP window region, including on image 26/3. Lungs/Pleura: Similar small left pleural effusion. Diffuse, slightly worse on the left bronchial wall thickening is similar. Left upper lobe endobronchial irregularity, including on image 70/4, is not significantly changed. Centrilobular and paraseptal emphysema. Redemonstration of left perihilar and central upper lobe interstitial thickening, likely radiation induced. Subtle increase in soft tissue density surrounding left upper lobe bronchovascular structures.  For example image 56/4 today versus 64/6 on the prior. Also coronal image 69 today versus 74 on the prior. Upper Abdomen: Normal imaged portions of the liver, spleen, pancreas, gallbladder, adrenal glands, kidneys. Proximal gastric underdistention. Musculoskeletal: Midthoracic spondylosis. IMPRESSION: 1. Radiation changes within the central left upper lobe and left perihilar region. Subtle increased soft tissue density surrounding left upper lobe bronchovascular structures may relate to evolving radiation change. Subtle residual or recurrent disease cannot be excluded. This could either be re-evaluated on follow-up or more entirely characterized with PET. 2. Otherwise, no evidence of residual or progressive disease. 3. Similar small left pleural effusion. 4. Aortic atherosclerosis (ICD10-I70.0) and emphysema (ICD10-J43.9). Electronically Signed   By: Abigail Miyamoto M.D.   On: 09/28/2018 09:25    Assessment & Plan:   There are no diagnoses linked to this encounter.   No orders of the defined types were placed in this encounter.    Follow-up: No follow-ups on file.  Walker Kehr, MD

## 2018-11-25 NOTE — Assessment & Plan Note (Signed)
Nexium 

## 2018-11-25 NOTE — Assessment & Plan Note (Signed)
Clonazepam prn  Potential benefits of a long term benzodiazepines  use as well as potential risks  and complications were explained to the patient and were aknowledged.  

## 2018-11-25 NOTE — Assessment & Plan Note (Signed)
On chemo Keytruda q 3 weeks

## 2018-11-25 NOTE — Assessment & Plan Note (Signed)
Breo Not smoking

## 2018-11-26 ENCOUNTER — Encounter: Payer: Self-pay | Admitting: Gastroenterology

## 2018-11-26 ENCOUNTER — Ambulatory Visit (AMBULATORY_SURGERY_CENTER): Payer: 59 | Admitting: Gastroenterology

## 2018-11-26 VITALS — BP 150/81 | HR 75 | Temp 98.7°F | Resp 21 | Ht 71.0 in | Wt 162.0 lb

## 2018-11-26 DIAGNOSIS — D120 Benign neoplasm of cecum: Secondary | ICD-10-CM

## 2018-11-26 DIAGNOSIS — D125 Benign neoplasm of sigmoid colon: Secondary | ICD-10-CM | POA: Diagnosis not present

## 2018-11-26 DIAGNOSIS — Z86010 Personal history of colonic polyps: Secondary | ICD-10-CM

## 2018-11-26 DIAGNOSIS — D123 Benign neoplasm of transverse colon: Secondary | ICD-10-CM

## 2018-11-26 DIAGNOSIS — D122 Benign neoplasm of ascending colon: Secondary | ICD-10-CM | POA: Diagnosis not present

## 2018-11-26 MED ORDER — SODIUM CHLORIDE 0.9 % IV SOLN: 500.0000 mL | Freq: Once | INTRAVENOUS | Status: DC

## 2018-11-26 NOTE — Progress Notes (Signed)
To PACU, VSS. Report to [email or web address] 

## 2018-11-26 NOTE — Op Note (Signed)
[facility] Patient Name: Trevor Zavala Procedure Date: 11/26/2018 2:04 PM MRN: [id number] Endoscopist: Ladene Artist , MD Age: 53 Referring MD:  Date of Birth: October 25, 1966 Gender: Male Account #: [id number] Procedure:                Colonoscopy Indications:              Surveillance: Personal history of adenomatous                            polyps on last colonoscopy > 3 years ago Medicines:                Monitored Anesthesia Care Procedure:                Pre-Anesthesia Assessment:                           - Prior to the procedure, a History and Physical                            was performed, and patient medications and                            allergies were reviewed. The patient's tolerance of                            previous anesthesia was also reviewed. The risks                            and benefits of the procedure and the sedation                            options and risks were discussed with the patient.                            All questions were answered, and informed consent                            was obtained. Prior Anticoagulants: The patient has                            taken no previous anticoagulant or antiplatelet                            agents. ASA Grade Assessment: III - A patient with                            severe systemic disease. After reviewing the risks                            and benefits, the patient was deemed in                            satisfactory condition to undergo the procedure.  After obtaining informed consent, the colonoscope                            was passed under direct vision. Throughout the                            procedure, the patient's blood pressure, pulse, and                            oxygen saturations were monitored continuously. The                            Colonoscope was introduced through the anus and                            advanced to the the  cecum, identified by                            appendiceal orifice and ileocecal valve. The                            ileocecal valve, appendiceal orifice, and rectum                            were photographed. The quality of the bowel                            preparation was adequate. The colonoscopy was                            performed without difficulty. The patient tolerated                            the procedure well. Scope In: 2:09:21 PM Scope Out: 2:36:37 PM Scope Withdrawal Time: 0 hours 23 minutes 59 seconds  Total Procedure Duration: 0 hours 27 minutes 16 seconds  Findings:                 The perianal and digital rectal examinations were                            normal.                           Fifteen sessile polyps were found in the sigmoid                            colon (7), transverse colon (6), ascending colon                            (1) and cecum (1). The polyps were 6 to 9 mm in                            size. These polyps were removed with a cold snare.  Resection and retrieval were complete.                           Two sessile polyps were found in the ascending                            colon. The polyps were 4 to 5 mm in size. These                            polyps were removed with a cold biopsy forceps.                            Resection and retrieval were complete.                           Internal hemorrhoids were found during                            retroflexion. The hemorrhoids were small and Grade                            I (internal hemorrhoids that do not prolapse).                           The exam was otherwise without abnormality on                            direct and retroflexion views. Complications:            No immediate complications. Estimated blood loss:                            None. Estimated Blood Loss:     Estimated blood loss: none. Impression:               - Fifteen 6 to 9 mm  polyps in the sigmoid colon, in                            the transverse colon, in the ascending colon and in                            the cecum, removed with a cold snare. Resected and                            retrieved.                           - Two 4 to 5 mm polyps in the ascending colon,                            removed with a cold biopsy forceps. Resected and                            retrieved.                           -  Internal hemorrhoids.                           - The examination was otherwise normal on direct                            and retroflexion views. Recommendation:           - Repeat colonoscopy in 2 years for surveillance,                            could adjust interval based on path review.                           - Patient has a contact number available for                            emergencies. The signs and symptoms of potential                            delayed complications were discussed with the                            patient. Return to normal activities tomorrow.                            Written discharge instructions were provided to the                            patient.                           - Resume previous diet.                           - Continue present medications.                           - Await pathology results. Ladene Artist, MD 11/26/2018 2:45:03 PM This report has been signed electronically.

## 2018-11-26 NOTE — Progress Notes (Signed)
Called to room to assist during endoscopic procedure.  Patient ID and intended procedure confirmed with present staff. Received instructions for my participation in the procedure from the performing physician.  

## 2018-11-26 NOTE — Patient Instructions (Signed)
Information on polyps and hemorrhoids given to you today.  Await pathology results.   YOU HAD AN ENDOSCOPIC PROCEDURE TODAY AT [facility]:   Refer to the procedure report that was given to you for any specific questions about what was found during the examination.  If the procedure report does not answer your questions, please call your gastroenterologist to clarify.  If you requested that your care partner not be given the details of your procedure findings, then the procedure report has been included in a sealed envelope for you to review at your convenience later.  YOU SHOULD EXPECT: Some feelings of bloating in the abdomen. Passage of more gas than usual.  Walking can help get rid of the air that was put into your GI tract during the procedure and reduce the bloating. If you had a lower endoscopy (such as a colonoscopy or flexible sigmoidoscopy) you may notice spotting of blood in your stool or on the toilet paper. If you underwent a bowel prep for your procedure, you may not have a normal bowel movement for a few days.  Please Note:  You might notice some irritation and congestion in your nose or some drainage.  This is from the oxygen used during your procedure.  There is no need for concern and it should clear up in a day or so.  SYMPTOMS TO REPORT IMMEDIATELY:   Following lower endoscopy (colonoscopy or flexible sigmoidoscopy):  Excessive amounts of blood in the stool  Significant tenderness or worsening of abdominal pains  Swelling of the abdomen that is new, acute  Fever of 100F or higher   For urgent or emergent issues, a gastroenterologist can be reached at any hour by calling [phone].   DIET:  We do recommend a small meal at first, but then you may proceed to your regular diet.  Drink plenty of fluids but you should avoid alcoholic beverages for 24 hours.  ACTIVITY:  You should plan to take it easy for the rest of today and you should NOT DRIVE or use  heavy machinery until tomorrow (because of the sedation medicines used during the test).    FOLLOW UP: Our staff will call the number listed on your records the next business day following your procedure to check on you and address any questions or concerns that you may have regarding the information given to you following your procedure. If we do not reach you, we will leave a message.  However, if you are feeling well and you are not experiencing any problems, there is no need to return our call.  We will assume that you have returned to your regular daily activities without incident.  If any biopsies were taken you will be contacted by phone or by letter within the next 1-3 weeks.  Please call us at [phone] if you have not heard about the biopsies in 3 weeks.    SIGNATURES/CONFIDENTIALITY: You and/or your care partner have signed paperwork which will be entered into your electronic medical record.  These signatures attest to the fact that that the information above on your After Visit Summary has been reviewed and is understood.  Full responsibility of the confidentiality of this discharge information lies with you and/or your care-partner.

## 2018-11-29 ENCOUNTER — Telehealth: Payer: Self-pay

## 2018-11-29 NOTE — Telephone Encounter (Signed)
Attempted to reach pt. With follow-up call following endoscopic procedure 11/26/2018.  LM on pt. Voice mail to call if he has any questions or concerns.

## 2018-11-29 NOTE — Telephone Encounter (Signed)
Called [phone] and left a messaged we tried to reach pt for a follow up call. maw

## 2018-11-30 ENCOUNTER — Ambulatory Visit (HOSPITAL_COMMUNITY)
Admission: RE | Admit: 2018-11-30 | Discharge: 2018-11-30 | Disposition: A | Discharge status: Home / Self Care | Payer: 59 | Source: Ambulatory Visit | Attending: Internal Medicine | Admitting: Internal Medicine

## 2018-11-30 ENCOUNTER — Encounter (HOSPITAL_COMMUNITY): Payer: Self-pay

## 2018-11-30 DIAGNOSIS — C3490 Malignant neoplasm of unspecified part of unspecified bronchus or lung: Secondary | ICD-10-CM | POA: Insufficient documentation

## 2018-11-30 MED ORDER — SODIUM CHLORIDE (PF) 0.9 % IJ SOLN
INTRAMUSCULAR | Status: AC
  Filled 2018-11-30: qty 50

## 2018-11-30 MED ORDER — IOHEXOL 300 MG/ML  SOLN
75.0000 mL | Freq: Once | INTRAMUSCULAR | Status: AC | PRN
  Administered 2018-11-30: 75 mL via INTRAVENOUS

## 2018-12-01 ENCOUNTER — Telehealth: Payer: Self-pay | Admitting: Internal Medicine

## 2018-12-01 ENCOUNTER — Ambulatory Visit: Payer: Self-pay | Admitting: Urology

## 2018-12-01 NOTE — Telephone Encounter (Signed)
FAXED RECORDS TO [facility] [phone]

## 2018-12-02 ENCOUNTER — Inpatient Hospital Stay (HOSPITAL_BASED_OUTPATIENT_CLINIC_OR_DEPARTMENT_OTHER): Payer: 59 | Admitting: Internal Medicine

## 2018-12-02 ENCOUNTER — Inpatient Hospital Stay: Payer: 59

## 2018-12-02 ENCOUNTER — Encounter: Payer: Self-pay | Admitting: Internal Medicine

## 2018-12-02 VITALS — BP 124/84 | HR 102 | Temp 99.0°F | Resp 18 | Ht 71.0 in | Wt 158.9 lb

## 2018-12-02 DIAGNOSIS — R591 Generalized enlarged lymph nodes: Secondary | ICD-10-CM

## 2018-12-02 DIAGNOSIS — C3412 Malignant neoplasm of upper lobe, left bronchus or lung: Secondary | ICD-10-CM | POA: Diagnosis not present

## 2018-12-02 DIAGNOSIS — Z9221 Personal history of antineoplastic chemotherapy: Secondary | ICD-10-CM

## 2018-12-02 DIAGNOSIS — Z79899 Other long term (current) drug therapy: Secondary | ICD-10-CM

## 2018-12-02 DIAGNOSIS — Z5112 Encounter for antineoplastic immunotherapy: Secondary | ICD-10-CM | POA: Diagnosis not present

## 2018-12-02 DIAGNOSIS — C321 Malignant neoplasm of supraglottis: Secondary | ICD-10-CM | POA: Diagnosis not present

## 2018-12-02 DIAGNOSIS — R490 Dysphonia: Secondary | ICD-10-CM

## 2018-12-02 DIAGNOSIS — I10 Essential (primary) hypertension: Secondary | ICD-10-CM

## 2018-12-02 DIAGNOSIS — Z95828 Presence of other vascular implants and grafts: Secondary | ICD-10-CM

## 2018-12-02 DIAGNOSIS — Z923 Personal history of irradiation: Secondary | ICD-10-CM | POA: Diagnosis not present

## 2018-12-02 DIAGNOSIS — J449 Chronic obstructive pulmonary disease, unspecified: Secondary | ICD-10-CM

## 2018-12-02 DIAGNOSIS — K219 Gastro-esophageal reflux disease without esophagitis: Secondary | ICD-10-CM

## 2018-12-02 LAB — CMP (CANCER CENTER ONLY)
ALT: 13 U/L (ref 0–44)
AST: 15 U/L (ref 15–41)
Albumin: 3.8 g/dL (ref 3.5–5.0)
Alkaline Phosphatase: 76 U/L (ref 38–126)
Anion gap: 8 (ref 5–15)
BUN: 5 mg/dL — ABNORMAL LOW (ref 6–20)
CO2: 27 mmol/L (ref 22–32)
Calcium: 9.1 mg/dL (ref 8.9–10.3)
Chloride: 105 mmol/L (ref 98–111)
Creatinine: 0.7 mg/dL (ref 0.61–1.24)
GFR, Est AFR Am: >60 mL/min (ref >60)
GFR, Estimated: >60 mL/min (ref >60)
Glucose, Bld: 119 mg/dL — ABNORMAL HIGH (ref 70–99)
Potassium: 4.1 mmol/L (ref 3.5–5.1)
Sodium: 140 mmol/L (ref 135–145)
Total Bilirubin: 0.4 mg/dL (ref 0.3–1.2)
Total Protein: 6.5 g/dL (ref 6.5–8.1)

## 2018-12-02 LAB — CBC WITH DIFFERENTIAL (CANCER CENTER ONLY)
Abs Immature Granulocytes: 0.02 10*3/uL (ref 0.00–0.07)
Basophils Absolute: 0 10*3/uL (ref 0.0–0.1)
Basophils Relative: 0 %
Eosinophils Absolute: 0 10*3/uL (ref 0.0–0.5)
Eosinophils Relative: 0 %
HCT: 47.5 % (ref 39.0–52.0)
Hemoglobin: 16.5 g/dL (ref 13.0–17.0)
IMMATURE GRANULOCYTES: 0 %
Lymphocytes Relative: 5 %
Lymphs Abs: 0.4 10*3/uL — ABNORMAL LOW (ref 0.7–4.0)
MCH: 32.7 pg (ref 26.0–34.0)
MCHC: 34.7 g/dL (ref 30.0–36.0)
MCV: 94.1 fL (ref 80.0–100.0)
Monocytes Absolute: 0.6 10*3/uL (ref 0.1–1.0)
Monocytes Relative: 7 %
NEUTROS PCT: 88 %
NRBC: 0 % (ref 0.0–0.2)
Neutro Abs: 7.3 10*3/uL (ref 1.7–7.7)
Platelet Count: 213 10*3/uL (ref 150–400)
RBC: 5.05 MIL/uL (ref 4.22–5.81)
RDW: 11.9 % (ref 11.5–15.5)
WBC Count: 8.4 10*3/uL (ref 4.0–10.5)

## 2018-12-02 LAB — TSH: TSH: 2.1 u[IU]/mL (ref 0.320–4.118)

## 2018-12-02 MED ORDER — SODIUM CHLORIDE 0.9 % IV SOLN
Freq: Once | INTRAVENOUS | Status: AC
  Administered 2018-12-02: 12:00:00 via INTRAVENOUS
  Filled 2018-12-02: qty 250

## 2018-12-02 MED ORDER — SODIUM CHLORIDE 0.9% FLUSH
10.0000 mL | Freq: Once | INTRAVENOUS | Status: AC
  Administered 2018-12-02: 10 mL
  Filled 2018-12-02: qty 10

## 2018-12-02 MED ORDER — SODIUM CHLORIDE 0.9 % IV SOLN
200.0000 mg | Freq: Once | INTRAVENOUS | Status: AC
  Administered 2018-12-02: 200 mg via INTRAVENOUS
  Filled 2018-12-02: qty 8

## 2018-12-02 MED ORDER — SODIUM CHLORIDE 0.9% FLUSH
10.0000 mL | INTRAVENOUS | Status: DC | PRN
  Administered 2018-12-02: 10 mL
  Filled 2018-12-02: qty 10

## 2018-12-02 MED ORDER — HEPARIN SOD (PORK) LOCK FLUSH 100 UNIT/ML IV SOLN
500.0000 [IU] | Freq: Once | INTRAVENOUS | Status: AC | PRN
  Administered 2018-12-02: 500 [IU]
  Filled 2018-12-02: qty 5

## 2018-12-02 NOTE — Patient Instructions (Signed)
[facility] Discharge Instructions for Patients Receiving Chemotherapy  Today you received the following chemotherapy agents:  Keytruda.  To help prevent nausea and vomiting after your treatment, we encourage you to take your nausea medication as directed.   If you develop nausea and vomiting that is not controlled by your nausea medication, call the clinic.   BELOW ARE SYMPTOMS THAT SHOULD BE REPORTED IMMEDIATELY:  *FEVER GREATER THAN 100.5 F  *CHILLS WITH OR WITHOUT FEVER  NAUSEA AND VOMITING THAT IS NOT CONTROLLED WITH YOUR NAUSEA MEDICATION  *UNUSUAL SHORTNESS OF BREATH  *UNUSUAL BRUISING OR BLEEDING  TENDERNESS IN MOUTH AND THROAT WITH OR WITHOUT PRESENCE OF ULCERS  *URINARY PROBLEMS  *BOWEL PROBLEMS  UNUSUAL RASH Items with * indicate a potential emergency and should be followed up as soon as possible.  Feel free to call the clinic should you have any questions or concerns. The clinic phone number is [phone].  Please show the CHEMO ALERT CARD at check-in to the Emergency Department and triage nurse.    

## 2018-12-02 NOTE — Patient Instructions (Signed)

## 2018-12-02 NOTE — Progress Notes (Signed)
[facility] [phone]   [phone]) [phone]  OFFICE PROGRESS NOTE  [name], [name], MD [place] [address]  DIAGNOSIS:  1) Stage IIB/IV (T3, N3, M1a) non-small cell lung cancer, squamous cell carcinoma presented with large left hilar mass in addition to mediastinal and left supraclavicular lymphadenopathy as well as contralateral right upper lobe nodule diagnosed in August 2018. PDL 1 expression: 90%. 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 First dose 06/29/2017. Status post 6 cycles. Last dose was given 08/03/2017.  CURRENT THERAPY: Immunotherapy with Ketruda 200 MG IV every 3 weeks, first dose 09/17/2017.  Status post 21 cycles.  INTERVAL HISTORY: Trevor Zavala 53 y.o. male returns to the clinic today for follow-up visit accompanied by his wife.  The patient is feeling fine today with no concerning complaints except for the persistent hoarseness of his voice.  He denied having any chest pain, shortness of breath, cough or hemoptysis.  He denied having any fever or chills.  He has no nausea, vomiting, diarrhea or constipation.  He denied having any headache or visual changes.  The patient had repeat CT scan of the neck and chest performed recently and is here for evaluation and discussion of his scan results.   MEDICAL HISTORY: Past Medical History:  Diagnosis Date  . Anxiety   . COPD (chronic obstructive pulmonary disease) ([facility])   . GERD (gastroesophageal reflux disease)   . Headache   . Hypertension   . Pneumonia   . Situational depression   . Stage III squamous cell carcinoma of left lung ([facility]) 06/19/2017   Lungs & Epiglottis  . Tubular adenoma of colon 01/2015  . Tubular adenoma of colon 01/2015    ALLERGIES:  is allergic to escitalopram oxalate.  MEDICATIONS:  Current Outpatient  Medications  Medication Sig Dispense Refill  . acyclovir (ZOVIRAX) 400 MG tablet Take 1 tablet (400 mg total) by mouth 3 (three) times daily. 21 tablet 3  . clonazePAM (KLONOPIN) 0.5 MG tablet TAKE 1 TO 2 TABLETS        (0.5-1MG  TOTAL) TWICE A DAYAS NEEDED FOR ANXIETY 180 tablet 1  . diphenhydrAMINE (BENADRYL) 25 MG tablet Take 12.5 mg by mouth every 6 (six) hours as needed for itching.    . esomeprazole (NEXIUM) 40 MG capsule 1 po qam 90 capsule 3  . fexofenadine-pseudoephedrine (ALLEGRA-D 24) 180-240 MG 24 hr tablet Take 1 tablet by mouth daily.    . fluticasone furoate-vilanterol (BREO ELLIPTA) 100-25 MCG/INH AEPB INHALE 1 PUFF INTO THE LUNGS DAILY. 3 each 3  . Multiple Vitamin (MULTIVITAMIN WITH MINERALS) TABS tablet Take 1 tablet by mouth daily.    . pembrolizumab (KEYTRUDA) 100 MG/4ML SOLN Inject 2 mg/kg into the vein. 200mg  every 3 weeks.     No current facility-administered medications for this visit.     SURGICAL HISTORY:  Past Surgical History:  Procedure Laterality Date  . COLONOSCOPY    . DIRECT LARYNGOSCOPY N/A 06/25/2017   Procedure: DIRECT LARYNGOSCOPY AND BIOPSY;  Surgeon: Rozetta Nunnery, MD;  Location: [facility];  Service: ENT;  Laterality: N/A;  . EAR CYST EXCISION N/A 11/17/2013   Procedure: SEBACEOUS CYST CHEST;  Surgeon: Joyice Faster. Cornett, MD;  Location: [facility];  Service: General;  Laterality: N/A;  . IR GASTROSTOMY TUBE MOD SED  08/10/2017  .  IR GASTROSTOMY TUBE REMOVAL  09/29/2017  . KNEE ARTHROSCOPY     LEFT  . LIPOMA EXCISION N/A 11/17/2013   Procedure: EXCISION LIPOMA FOREHEAD;  Surgeon: Joyice Faster. Cornett, MD;  Location: [facility];  Service: General;  Laterality: N/A;  . LUNG BIOPSY Bilateral 06/12/2017   Procedure: LEFT LUNG BIOPSY;  Surgeon: Grace Isaac, MD;  Location: [facility];  Service: Thoracic;  Laterality: Bilateral;  . MICROLARYNGOSCOPY W/VOCAL CORD INJECTION Left 07/16/2018   Procedure: suspened  micro laryngoscopy with jet ventilation and prolaryn injection;  Surgeon: Melida Quitter, MD;  Location: [facility];  Service: ENT;  Laterality: Left;  . PORTACATH PLACEMENT Right 07/01/2017   Procedure: INSERTION PORT-A-CATH - RIGHT IJ - placed with Fluoro and Ultrasound;  Surgeon: Grace Isaac, MD;  Location: [facility];  Service: Thoracic;  Laterality: Right;  Marland Kitchen VIDEO BRONCHOSCOPY WITH ENDOBRONCHIAL ULTRASOUND N/A 06/12/2017   Procedure: VIDEO BRONCHOSCOPY WITH ENDOBRONCHIAL ULTRASOUND;  Surgeon: Grace Isaac, MD;  Location: [facility];  Service: Thoracic;  Laterality: N/A;    REVIEW OF SYSTEMS:  Constitutional: negative Eyes: negative Ears, nose, mouth, throat, and face: positive for hoarseness Respiratory: negative Cardiovascular: negative Gastrointestinal: negative Genitourinary:negative Integument/breast: negative Hematologic/lymphatic: negative Musculoskeletal:negative Neurological: negative Behavioral/Psych: negative Endocrine: negative Allergic/Immunologic: negative   PHYSICAL EXAMINATION: General appearance: alert, cooperative and no distress Head: Normocephalic, without obvious abnormality, atraumatic Neck: no adenopathy, no JVD, supple, symmetrical, trachea midline and thyroid not enlarged, symmetric, no tenderness/mass/nodules Lymph nodes: Cervical, supraclavicular, and axillary nodes normal. Resp: clear to auscultation bilaterally Back: symmetric, no curvature. ROM normal. No CVA tenderness. Cardio: regular rate and rhythm, S1, S2 normal, no murmur, click, rub or gallop GI: soft, non-tender; bowel sounds normal; no masses,  no organomegaly Extremities: extremities normal, atraumatic, no cyanosis or edema Neurologic: Alert and oriented X 3, normal strength and tone. Normal symmetric reflexes. Normal coordination and gait  ECOG PERFORMANCE STATUS: 1 - Symptomatic but completely ambulatory  Blood pressure 124/84, pulse (!) 102, temperature 99 F (37.2 C), temperature source  Oral, resp. rate 18, height 5\' 11"  (1.803 m), weight 158 lb 14.4 oz (72.1 kg), SpO2 95 %.  LABORATORY DATA: Lab Results  Component Value Date   WBC 8.4 12/02/2018   HGB 16.5 12/02/2018   HCT 47.5 12/02/2018   MCV 94.1 12/02/2018   PLT 213 12/02/2018      Chemistry      Component Value Date/Time   NA 140 12/02/2018 1029   NA 141 10/29/2017 1049   K 4.1 12/02/2018 1029   K 3.3 (L) 10/29/2017 1049   CL 105 12/02/2018 1029   CO2 27 12/02/2018 1029   CO2 26 10/29/2017 1049   BUN 5 (L) 12/02/2018 1029   BUN 11.2 10/29/2017 1049   CREATININE 0.70 12/02/2018 1029   CREATININE 0.7 10/29/2017 1049   GLU 198 08/25/2017      Component Value Date/Time   CALCIUM 9.1 12/02/2018 1029   CALCIUM 9.0 10/29/2017 1049   ALKPHOS 76 12/02/2018 1029   ALKPHOS 82 10/29/2017 1049   AST 15 12/02/2018 1029   AST 10 10/29/2017 1049   ALT 13 12/02/2018 1029   ALT 8 10/29/2017 1049   BILITOT 0.4 12/02/2018 1029   BILITOT 0.22 10/29/2017 1049       RADIOGRAPHIC STUDIES: Ct Soft Tissue Neck W Contrast  Result Date: 11/30/2018 CLINICAL DATA:  53 year old male with history of epiglottis and lung cancer. Immunotherapy in progress. 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  Restaging. EXAM: CT NECK WITH CONTRAST TECHNIQUE: Multidetector CT imaging of the neck was performed using the standard protocol following the bolus administration of intravenous contrast. CONTRAST:  35mL OMNIPAQUE IOHEXOL 300 MG/ML SOLN in conjunction with contrast enhanced imaging of the chest reported separately. COMPARISON:  Neck CT 11/28/2017 and earlier. Chest CT today reported separately. FINDINGS: Pharynx and larynx: Focal port implants redemonstrated. Laryngeal soft tissue contours are stable and within normal limits; mild generalized soft tissue thickening compatible with prior radiation. Pharyngeal soft tissue contours are within normal limits. Negative parapharyngeal and retropharyngeal  spaces. Salivary glands: Negative sublingual space. Submandibular glands are less hyperenhancing since September and appear normal. Parotid glands are symmetric and within normal limits. Thyroid: Negative. Lymph nodes: Mild thickening of the platysma with less adjacent fat stranding since September which with likely radiation related. Stable since September and negative appearance of left level 4/subclavian lymph nodes. Other lymph node stations are stable and within normal limits. No enlarged or heterogeneous lymph nodes identified. Vascular: Major vascular structures in the neck and at the skull base are patent. Stable mild to moderate soft and calcified plaque at the left carotid bifurcation. Stable right IJ approach porta cath. Limited intracranial: Negative. Visualized orbits: Negative. Mastoids and visualized paranasal sinuses: Mild left maxillary retention cyst is stable. Mild new mucosal thickening posterior right ethmoid. Otherwise well pneumatized. Previous left mastoidectomy is stable. Skeleton: Stable, negative. Upper chest: Reported separately today. IMPRESSION: 1. Satisfactory post treatment appearance of the neck. Expected evolution of post radiation changes since mid 2019. NI-RADS category 1. 2.  CT Chest today reported separately. Electronically Signed   By: Genevie Ann M.D.   On: 11/30/2018 08:40   Ct Chest W Contrast  Result Date: 11/30/2018 CLINICAL DATA:  Non-small cell lung cancer and epiglottis carcinoma. Chemotherapy radiation therapy complete. Immunotherapy in progress. EXAM: CT CHEST WITH CONTRAST TECHNIQUE: Multidetector CT imaging of the chest was performed during intravenous contrast administration. CONTRAST:  58mL OMNIPAQUE IOHEXOL 300 MG/ML  SOLN COMPARISON:  CT 09/28/2018 FINDINGS: Cardiovascular: Port in the anterior chest wall with tip in distal SVC. Mediastinum/Nodes: No axillary supraclavicular adenopathy. Peribronchial thickening in the LEFT hilum around the mainstem bronchus is  unchanged. No enlarged mediastinal lymph nodes. Lungs/Pleura: Subpleural reticulation and linear banding in the medial upper lobes along the mediastinum in a radiation pattern. No change in pattern from CT 09/28/2018. Upper Abdomen: Limited view of the liver, kidneys, pancreas are unremarkable. Normal adrenal glands. Musculoskeletal: No aggressive osseous lesion. IMPRESSION: 1. No evidence lung cancer recurrence. 2. Stable radiation change in the paramediastinal upper lobes and peribronchial LEFT hilum. Electronically Signed   By: Suzy Bouchard M.D.   On: 11/30/2018 08:37    ASSESSMENT AND PLAN: This is a very pleasant 53 years old white male with recently diagnosed stage IIIB/IV non-small cell lung cancer, adenocarcinoma presented with large left hilar mass in addition to mediastinal and left supraclavicular lymphadenopathy as well as suspicious right upper lobe pulmonary nodule diagnosed in August 2018. 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 He underwent a course of concurrent chemoradiation to the lung as well as the epiglottic area under the care of Dr. Tammi Klippel. He is status post 6 cycle. He tolerated this course of treatment well except for the radiation induced esophagitis as well as weight loss and fatigue. The patient had partial response to the previous treatment. He is currently on treatment with Keytruda 200 mg IV every 3 weeks status post 21 cycles. The  patient continues to tolerate this treatment well with no concerning adverse effects. He had repeat CT scan of the neck and chest performed recently.  I personally and independently reviewed the scans and discussed the results with the patient and his wife. His scan showed no concerning findings for disease progression. I recommended for the patient to proceed with cycle #22 today as scheduled. I will see him back for follow-up visit in 3 weeks for evaluation and repeat blood work before  cycle #23. For the hoarseness of his voice he will continue to follow-up with ENT. The patient was advised to call immediately if he has any concerning symptoms in the interval. The patient voices understanding of current disease status and treatment options and is in agreement with the current care plan. All questions were answered. The patient knows to call the clinic with any problems, questions or concerns. We can certainly see the patient much sooner if necessary. Disclaimer: This note was dictated with voice recognition software. Similar sounding words can inadvertently be transcribed and may not be corrected upon review.

## 2018-12-03 ENCOUNTER — Telehealth: Payer: Self-pay | Admitting: Internal Medicine

## 2018-12-03 ENCOUNTER — Encounter: Payer: Self-pay | Admitting: Gastroenterology

## 2018-12-03 NOTE — Telephone Encounter (Signed)
3 cycles already scheduled per 1/23 los .

## 2018-12-08 ENCOUNTER — Telehealth: Payer: Self-pay | Admitting: Urology

## 2018-12-08 NOTE — Telephone Encounter (Signed)
I spoke with the patient's wife via telephone regarding her husband's follow up care for his previously treated squamous cell carcinoma of the supraglottic larynx. He was recently evaluated with Dr. Redmond Baseman on 11/17/18 with fiberoptic nasopharyngoscopy and has a scheduled follow up with him in 3 months.  She was under the impression that all of his follow up will occur with Dr. Redmond Baseman and he will f/u with [place] only if necessary should there be evidence of disease recurrence or progression in the future.  I reviewed Dr. Redmond Baseman' most recent OV note and this confirms her understanding.  Therefore, I will cancel the scheduled f/u visit in Rad. Onc. for 12/09/18 and will plan to see him back on a prn basis only.  He is also being followed routinely with Dr. Julien Nordmann for management of his NSCLC.   She is comfortable with and in agreement with the stated plan.  She knows to call us at any time with any questions or concerns regarding his previous radiotherapy and that we are happy to continue to participate in his care if clinically indicated in the future.  Nicholos Johns, MMS, PA-C [facility] [place]: [phone]  Fax: [phone]

## 2018-12-09 ENCOUNTER — Ambulatory Visit: Payer: 59 | Admitting: Radiation Oncology

## 2018-12-23 ENCOUNTER — Inpatient Hospital Stay: Payer: 59

## 2018-12-23 ENCOUNTER — Inpatient Hospital Stay: Payer: 59 | Attending: Internal Medicine

## 2018-12-23 ENCOUNTER — Encounter: Payer: Self-pay | Admitting: Internal Medicine

## 2018-12-23 ENCOUNTER — Inpatient Hospital Stay (HOSPITAL_BASED_OUTPATIENT_CLINIC_OR_DEPARTMENT_OTHER): Payer: 59 | Admitting: Internal Medicine

## 2018-12-23 ENCOUNTER — Telehealth: Payer: Self-pay | Admitting: Internal Medicine

## 2018-12-23 VITALS — BP 134/84 | HR 78 | Temp 98.2°F | Resp 18 | Ht 71.0 in | Wt 162.7 lb

## 2018-12-23 DIAGNOSIS — C321 Malignant neoplasm of supraglottis: Secondary | ICD-10-CM | POA: Diagnosis not present

## 2018-12-23 DIAGNOSIS — Z923 Personal history of irradiation: Secondary | ICD-10-CM | POA: Insufficient documentation

## 2018-12-23 DIAGNOSIS — I10 Essential (primary) hypertension: Secondary | ICD-10-CM | POA: Diagnosis not present

## 2018-12-23 DIAGNOSIS — K219 Gastro-esophageal reflux disease without esophagitis: Secondary | ICD-10-CM | POA: Insufficient documentation

## 2018-12-23 DIAGNOSIS — Z9221 Personal history of antineoplastic chemotherapy: Secondary | ICD-10-CM | POA: Diagnosis not present

## 2018-12-23 DIAGNOSIS — Z79899 Other long term (current) drug therapy: Secondary | ICD-10-CM

## 2018-12-23 DIAGNOSIS — R490 Dysphonia: Secondary | ICD-10-CM | POA: Diagnosis not present

## 2018-12-23 DIAGNOSIS — Z5112 Encounter for antineoplastic immunotherapy: Secondary | ICD-10-CM | POA: Diagnosis present

## 2018-12-23 DIAGNOSIS — C3412 Malignant neoplasm of upper lobe, left bronchus or lung: Secondary | ICD-10-CM

## 2018-12-23 DIAGNOSIS — J449 Chronic obstructive pulmonary disease, unspecified: Secondary | ICD-10-CM | POA: Diagnosis not present

## 2018-12-23 LAB — CMP (CANCER CENTER ONLY)
ALK PHOS: 70 U/L (ref 38–126)
ALT: 8 U/L (ref 0–44)
AST: 14 U/L — ABNORMAL LOW (ref 15–41)
Albumin: 3.7 g/dL (ref 3.5–5.0)
Anion gap: 6 (ref 5–15)
BILIRUBIN TOTAL: 0.5 mg/dL (ref 0.3–1.2)
BUN: 13 mg/dL (ref 6–20)
CALCIUM: 9.1 mg/dL (ref 8.9–10.3)
CO2: 29 mmol/L (ref 22–32)
Chloride: 106 mmol/L (ref 98–111)
Creatinine: 0.69 mg/dL (ref 0.61–1.24)
GFR, Est AFR Am: >60 mL/min (ref >60)
GFR, Estimated: >60 mL/min (ref >60)
GLUCOSE: 94 mg/dL (ref 70–99)
Potassium: 4.1 mmol/L (ref 3.5–5.1)
Sodium: 141 mmol/L (ref 135–145)
Total Protein: 6.4 g/dL — ABNORMAL LOW (ref 6.5–8.1)

## 2018-12-23 LAB — CBC WITH DIFFERENTIAL (CANCER CENTER ONLY)
Abs Immature Granulocytes: 0.01 10*3/uL (ref 0.00–0.07)
Basophils Absolute: 0 10*3/uL (ref 0.0–0.1)
Basophils Relative: 1 %
EOS PCT: 1 %
Eosinophils Absolute: 0 10*3/uL (ref 0.0–0.5)
HEMATOCRIT: 48 % (ref 39.0–52.0)
HEMOGLOBIN: 15.9 g/dL (ref 13.0–17.0)
Immature Granulocytes: 0 %
LYMPHS PCT: 9 %
Lymphs Abs: 0.4 10*3/uL — ABNORMAL LOW (ref 0.7–4.0)
MCH: 32.1 pg (ref 26.0–34.0)
MCHC: 33.1 g/dL (ref 30.0–36.0)
MCV: 97 fL (ref 80.0–100.0)
Monocytes Absolute: 0.5 10*3/uL (ref 0.1–1.0)
Monocytes Relative: 9 %
Neutro Abs: 3.9 10*3/uL (ref 1.7–7.7)
Neutrophils Relative %: 80 %
Platelet Count: 199 10*3/uL (ref 150–400)
RBC: 4.95 MIL/uL (ref 4.22–5.81)
RDW: 12.3 % (ref 11.5–15.5)
WBC Count: 4.9 10*3/uL (ref 4.0–10.5)
nRBC: 0 % (ref 0.0–0.2)

## 2018-12-23 LAB — TSH: TSH: 2.323 u[IU]/mL (ref 0.320–4.118)

## 2018-12-23 MED ORDER — HEPARIN SOD (PORK) LOCK FLUSH 100 UNIT/ML IV SOLN
500.0000 [IU] | Freq: Once | INTRAVENOUS | Status: AC | PRN
  Administered 2018-12-23: 500 [IU]
  Filled 2018-12-23: qty 5

## 2018-12-23 MED ORDER — SODIUM CHLORIDE 0.9 % IV SOLN
Freq: Once | INTRAVENOUS | Status: AC
  Administered 2018-12-23: 10:00:00 via INTRAVENOUS
  Filled 2018-12-23: qty 250

## 2018-12-23 MED ORDER — SODIUM CHLORIDE 0.9 % IV SOLN
200.0000 mg | Freq: Once | INTRAVENOUS | Status: AC
  Administered 2018-12-23: 200 mg via INTRAVENOUS
  Filled 2018-12-23: qty 8

## 2018-12-23 MED ORDER — SODIUM CHLORIDE 0.9% FLUSH
10.0000 mL | INTRAVENOUS | Status: DC | PRN
  Administered 2018-12-23: 10 mL
  Filled 2018-12-23: qty 10

## 2018-12-23 NOTE — Patient Instructions (Signed)
[facility] Discharge Instructions for Patients Receiving Chemotherapy  Today you received the following chemotherapy agents:  Keytruda.  To help prevent nausea and vomiting after your treatment, we encourage you to take your nausea medication as directed.   If you develop nausea and vomiting that is not controlled by your nausea medication, call the clinic.   BELOW ARE SYMPTOMS THAT SHOULD BE REPORTED IMMEDIATELY:  *FEVER GREATER THAN 100.5 F  *CHILLS WITH OR WITHOUT FEVER  NAUSEA AND VOMITING THAT IS NOT CONTROLLED WITH YOUR NAUSEA MEDICATION  *UNUSUAL SHORTNESS OF BREATH  *UNUSUAL BRUISING OR BLEEDING  TENDERNESS IN MOUTH AND THROAT WITH OR WITHOUT PRESENCE OF ULCERS  *URINARY PROBLEMS  *BOWEL PROBLEMS  UNUSUAL RASH Items with * indicate a potential emergency and should be followed up as soon as possible.  Feel free to call the clinic should you have any questions or concerns. The clinic phone number is [phone].  Please show the CHEMO ALERT CARD at check-in to the Emergency Department and triage nurse.    

## 2018-12-23 NOTE — Telephone Encounter (Signed)
Scheduled appt per 02/13 los.

## 2018-12-23 NOTE — Progress Notes (Signed)
[facility] [phone]   [phone]  OFFICE PROGRESS NOTE  [name], [name], MD [place] [address]  DIAGNOSIS:  1) Stage IIB/IV (T3, N3, M1a) non-small cell lung cancer, squamous cell carcinoma presented with large left hilar mass in addition to mediastinal and left supraclavicular lymphadenopathy as well as contralateral right upper lobe nodule diagnosed in August 2018. PDL 1 expression: 90%. 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 First dose 06/29/2017. Status post 6 cycles. Last dose was given 08/03/2017.  CURRENT THERAPY: Immunotherapy with Ketruda 200 MG IV every 3 weeks, first dose 09/17/2017.  Status post 22 cycles.  INTERVAL HISTORY: Trevor Zavala 53 y.o. male returns to the clinic today for follow-up visit.  The patient is feeling fine today with no concerning complaints.  He denied having any chest pain, shortness of breath, cough or hemoptysis.  He continues to have the hoarseness of his voice.  He denied having any nausea, vomiting, diarrhea or constipation.  He has no skin rash or itching.  He is here today for evaluation before starting cycle #23 of his treatment with Keytruda.    MEDICAL HISTORY: Past Medical History:  Diagnosis Date  . Anxiety   . COPD (chronic obstructive pulmonary disease) ([facility])   . GERD (gastroesophageal reflux disease)   . Headache   . Hypertension   . Pneumonia   . Situational depression   . Stage III squamous cell carcinoma of left lung ([facility]) 06/19/2017   Lungs & Epiglottis  . Tubular adenoma of colon 01/2015  . Tubular adenoma of colon 01/2015    ALLERGIES:  is allergic to escitalopram oxalate.  MEDICATIONS:  Current Outpatient Medications  Medication Sig Dispense Refill  . acyclovir (ZOVIRAX) 400 MG tablet Take 1 tablet (400 mg total) by  mouth 3 (three) times daily. 21 tablet 3  . clonazePAM (KLONOPIN) 0.5 MG tablet TAKE 1 TO 2 TABLETS        (0.5-1MG  TOTAL) TWICE A DAYAS NEEDED FOR ANXIETY 180 tablet 1  . diphenhydrAMINE (BENADRYL) 25 MG tablet Take 12.5 mg by mouth every 6 (six) hours as needed for itching.    . esomeprazole (NEXIUM) 40 MG capsule 1 po qam 90 capsule 3  . fexofenadine-pseudoephedrine (ALLEGRA-D 24) 180-240 MG 24 hr tablet Take 1 tablet by mouth daily.    . fluticasone furoate-vilanterol (BREO ELLIPTA) 100-25 MCG/INH AEPB INHALE 1 PUFF INTO THE LUNGS DAILY. 3 each 3  . Multiple Vitamin (MULTIVITAMIN WITH MINERALS) TABS tablet Take 1 tablet by mouth daily.    . pembrolizumab (KEYTRUDA) 100 MG/4ML SOLN Inject 2 mg/kg into the vein. 200mg  every 3 weeks.     No current facility-administered medications for this visit.     SURGICAL HISTORY:  Past Surgical History:  Procedure Laterality Date  . COLONOSCOPY    . DIRECT LARYNGOSCOPY N/A 06/25/2017   Procedure: DIRECT LARYNGOSCOPY AND BIOPSY;  Surgeon: Rozetta Nunnery, MD;  Location: [facility];  Service: ENT;  Laterality: N/A;  . EAR CYST EXCISION N/A 11/17/2013   Procedure: SEBACEOUS CYST CHEST;  Surgeon: Joyice Faster. Cornett, MD;  Location: [facility];  Service: General;  Laterality: N/A;  . IR GASTROSTOMY TUBE MOD SED  08/10/2017  . IR GASTROSTOMY TUBE REMOVAL  09/29/2017  . KNEE ARTHROSCOPY     LEFT  . LIPOMA  EXCISION N/A 11/17/2013   Procedure: EXCISION LIPOMA FOREHEAD;  Surgeon: Joyice Faster. Cornett, MD;  Location: [facility];  Service: General;  Laterality: N/A;  . LUNG BIOPSY Bilateral 06/12/2017   Procedure: LEFT LUNG BIOPSY;  Surgeon: Grace Isaac, MD;  Location: [facility];  Service: Thoracic;  Laterality: Bilateral;  . MICROLARYNGOSCOPY W/VOCAL CORD INJECTION Left 07/16/2018   Procedure: suspened micro laryngoscopy with jet ventilation and prolaryn injection;  Surgeon: Melida Quitter, MD;  Location: [facility];   Service: ENT;  Laterality: Left;  . PORTACATH PLACEMENT Right 07/01/2017   Procedure: INSERTION PORT-A-CATH - RIGHT IJ - placed with Fluoro and Ultrasound;  Surgeon: Grace Isaac, MD;  Location: [facility];  Service: Thoracic;  Laterality: Right;  Marland Kitchen VIDEO BRONCHOSCOPY WITH ENDOBRONCHIAL ULTRASOUND N/A 06/12/2017   Procedure: VIDEO BRONCHOSCOPY WITH ENDOBRONCHIAL ULTRASOUND;  Surgeon: Grace Isaac, MD;  Location: [facility];  Service: Thoracic;  Laterality: N/A;    REVIEW OF SYSTEMS:  A comprehensive review of systems was negative except for: Ears, nose, mouth, throat, and face: positive for hoarseness   PHYSICAL EXAMINATION: General appearance: alert, cooperative and no distress Head: Normocephalic, without obvious abnormality, atraumatic Neck: no adenopathy, no JVD, supple, symmetrical, trachea midline and thyroid not enlarged, symmetric, no tenderness/mass/nodules Lymph nodes: Cervical, supraclavicular, and axillary nodes normal. Resp: clear to auscultation bilaterally Back: symmetric, no curvature. ROM normal. No CVA tenderness. Cardio: regular rate and rhythm, S1, S2 normal, no murmur, click, rub or gallop GI: soft, non-tender; bowel sounds normal; no masses,  no organomegaly Extremities: extremities normal, atraumatic, no cyanosis or edema  ECOG PERFORMANCE STATUS: 1 - Symptomatic but completely ambulatory  Blood pressure 134/84, pulse 78, temperature 98.2 F (36.8 C), temperature source Oral, resp. rate 18, height 5\' 11"  (1.803 m), weight 162 lb 11.2 oz (73.8 kg), SpO2 96 %.  LABORATORY DATA: Lab Results  Component Value Date   WBC 8.4 12/02/2018   HGB 16.5 12/02/2018   HCT 47.5 12/02/2018   MCV 94.1 12/02/2018   PLT 213 12/02/2018      Chemistry      Component Value Date/Time   NA 140 12/02/2018 1029   NA 141 10/29/2017 1049   K 4.1 12/02/2018 1029   K 3.3 (L) 10/29/2017 1049   CL 105 12/02/2018 1029   CO2 27 12/02/2018 1029   CO2 26 10/29/2017 1049   BUN 5 (L)  12/02/2018 1029   BUN 11.2 10/29/2017 1049   CREATININE 0.70 12/02/2018 1029   CREATININE 0.7 10/29/2017 1049   GLU 198 08/25/2017      Component Value Date/Time   CALCIUM 9.1 12/02/2018 1029   CALCIUM 9.0 10/29/2017 1049   ALKPHOS 76 12/02/2018 1029   ALKPHOS 82 10/29/2017 1049   AST 15 12/02/2018 1029   AST 10 10/29/2017 1049   ALT 13 12/02/2018 1029   ALT 8 10/29/2017 1049   BILITOT 0.4 12/02/2018 1029   BILITOT 0.22 10/29/2017 1049       RADIOGRAPHIC STUDIES: Ct Soft Tissue Neck W Contrast  Result Date: 11/30/2018 CLINICAL DATA:  53 year old male with history of epiglottis and lung cancer. Immunotherapy in progress. 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  Restaging. EXAM: CT NECK WITH CONTRAST TECHNIQUE: Multidetector CT imaging of the neck was performed using the standard protocol following the bolus administration of intravenous contrast. CONTRAST:  17mL OMNIPAQUE IOHEXOL 300 MG/ML SOLN in conjunction with contrast enhanced imaging of the chest reported separately. COMPARISON:  Neck CT 11/28/2017  and earlier. Chest CT today reported separately. FINDINGS: Pharynx and larynx: Focal port implants redemonstrated. Laryngeal soft tissue contours are stable and within normal limits; mild generalized soft tissue thickening compatible with prior radiation. Pharyngeal soft tissue contours are within normal limits. Negative parapharyngeal and retropharyngeal spaces. Salivary glands: Negative sublingual space. Submandibular glands are less hyperenhancing since September and appear normal. Parotid glands are symmetric and within normal limits. Thyroid: Negative. Lymph nodes: Mild thickening of the platysma with less adjacent fat stranding since September which with likely radiation related. Stable since September and negative appearance of left level 4/subclavian lymph nodes. Other lymph node stations are stable and within normal limits. No enlarged or  heterogeneous lymph nodes identified. Vascular: Major vascular structures in the neck and at the skull base are patent. Stable mild to moderate soft and calcified plaque at the left carotid bifurcation. Stable right IJ approach porta cath. Limited intracranial: Negative. Visualized orbits: Negative. Mastoids and visualized paranasal sinuses: Mild left maxillary retention cyst is stable. Mild new mucosal thickening posterior right ethmoid. Otherwise well pneumatized. Previous left mastoidectomy is stable. Skeleton: Stable, negative. Upper chest: Reported separately today. IMPRESSION: 1. Satisfactory post treatment appearance of the neck. Expected evolution of post radiation changes since mid 2019. NI-RADS category 1. 2.  CT Chest today reported separately. Electronically Signed   By: Genevie Ann M.D.   On: 11/30/2018 08:40   Ct Chest W Contrast  Result Date: 11/30/2018 CLINICAL DATA:  Non-small cell lung cancer and epiglottis carcinoma. Chemotherapy radiation therapy complete. Immunotherapy in progress. EXAM: CT CHEST WITH CONTRAST TECHNIQUE: Multidetector CT imaging of the chest was performed during intravenous contrast administration. CONTRAST:  35mL OMNIPAQUE IOHEXOL 300 MG/ML  SOLN COMPARISON:  CT 09/28/2018 FINDINGS: Cardiovascular: Port in the anterior chest wall with tip in distal SVC. Mediastinum/Nodes: No axillary supraclavicular adenopathy. Peribronchial thickening in the LEFT hilum around the mainstem bronchus is unchanged. No enlarged mediastinal lymph nodes. Lungs/Pleura: Subpleural reticulation and linear banding in the medial upper lobes along the mediastinum in a radiation pattern. No change in pattern from CT 09/28/2018. Upper Abdomen: Limited view of the liver, kidneys, pancreas are unremarkable. Normal adrenal glands. Musculoskeletal: No aggressive osseous lesion. IMPRESSION: 1. No evidence lung cancer recurrence. 2. Stable radiation change in the paramediastinal upper lobes and peribronchial LEFT  hilum. Electronically Signed   By: Suzy Bouchard M.D.   On: 11/30/2018 08:37    ASSESSMENT AND PLAN: This is a very pleasant 53 years old white male with recently diagnosed stage IIIB/IV non-small cell lung cancer, adenocarcinoma presented with large left hilar mass in addition to mediastinal and left supraclavicular lymphadenopathy as well as suspicious right upper lobe pulmonary nodule diagnosed in August 2018. 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 He underwent a course of concurrent chemoradiation to the lung as well as the epiglottic area under the care of Dr. Tammi Klippel. He is status post 6 cycle. He tolerated this course of treatment well except for the radiation induced esophagitis as well as weight loss and fatigue. The patient had partial response to the previous treatment. He is currently on treatment with Keytruda 200 mg IV every 3 weeks status post 22 cycles. He continues to tolerate his treatment well with no concerning adverse effects. I recommended for him to proceed with cycle #23 today as scheduled. I will see him back for follow-up visit in 3 weeks for evaluation before the next cycle of his treatment. The patient was advised to call immediately  if he has any concerning symptoms in the interval. The patient voices understanding of current disease status and treatment options and is in agreement with the current care plan. All questions were answered. The patient knows to call the clinic with any problems, questions or concerns. We can certainly see the patient much sooner if necessary. Disclaimer: This note was dictated with voice recognition software. Similar sounding words can inadvertently be transcribed and may not be corrected upon review.

## 2019-01-11 ENCOUNTER — Other Ambulatory Visit: Payer: Self-pay | Admitting: Medical Oncology

## 2019-01-11 DIAGNOSIS — C3412 Malignant neoplasm of upper lobe, left bronchus or lung: Secondary | ICD-10-CM

## 2019-01-12 ENCOUNTER — Encounter: Payer: Self-pay | Admitting: Internal Medicine

## 2019-01-12 ENCOUNTER — Inpatient Hospital Stay: Payer: 59 | Attending: Internal Medicine

## 2019-01-12 ENCOUNTER — Other Ambulatory Visit: Payer: Self-pay

## 2019-01-12 ENCOUNTER — Inpatient Hospital Stay: Payer: 59

## 2019-01-12 ENCOUNTER — Inpatient Hospital Stay (HOSPITAL_BASED_OUTPATIENT_CLINIC_OR_DEPARTMENT_OTHER): Payer: 59 | Admitting: Internal Medicine

## 2019-01-12 ENCOUNTER — Telehealth: Payer: Self-pay | Admitting: Internal Medicine

## 2019-01-12 VITALS — BP 115/79 | HR 82 | Temp 98.3°F | Resp 18 | Ht 71.0 in | Wt 160.5 lb

## 2019-01-12 DIAGNOSIS — J449 Chronic obstructive pulmonary disease, unspecified: Secondary | ICD-10-CM | POA: Insufficient documentation

## 2019-01-12 DIAGNOSIS — I10 Essential (primary) hypertension: Secondary | ICD-10-CM | POA: Insufficient documentation

## 2019-01-12 DIAGNOSIS — F419 Anxiety disorder, unspecified: Secondary | ICD-10-CM | POA: Diagnosis not present

## 2019-01-12 DIAGNOSIS — C3412 Malignant neoplasm of upper lobe, left bronchus or lung: Secondary | ICD-10-CM | POA: Diagnosis present

## 2019-01-12 DIAGNOSIS — Z79899 Other long term (current) drug therapy: Secondary | ICD-10-CM | POA: Diagnosis not present

## 2019-01-12 DIAGNOSIS — Z95828 Presence of other vascular implants and grafts: Secondary | ICD-10-CM

## 2019-01-12 DIAGNOSIS — C321 Malignant neoplasm of supraglottis: Secondary | ICD-10-CM

## 2019-01-12 DIAGNOSIS — Z5112 Encounter for antineoplastic immunotherapy: Secondary | ICD-10-CM

## 2019-01-12 LAB — CMP (CANCER CENTER ONLY)
ALT: 9 U/L (ref 0–44)
AST: 16 U/L (ref 15–41)
Albumin: 3.8 g/dL (ref 3.5–5.0)
Alkaline Phosphatase: 72 U/L (ref 38–126)
Anion gap: 8 (ref 5–15)
BUN: 9 mg/dL (ref 6–20)
CO2: 27 mmol/L (ref 22–32)
Calcium: 8.9 mg/dL (ref 8.9–10.3)
Chloride: 104 mmol/L (ref 98–111)
Creatinine: 0.73 mg/dL (ref 0.61–1.24)
GFR, Est AFR Am: >60 mL/min (ref >60)
GFR, Estimated: >60 mL/min (ref >60)
Glucose, Bld: 116 mg/dL — ABNORMAL HIGH (ref 70–99)
Potassium: 4.3 mmol/L (ref 3.5–5.1)
Sodium: 139 mmol/L (ref 135–145)
Total Bilirubin: 0.5 mg/dL (ref 0.3–1.2)
Total Protein: 6.7 g/dL (ref 6.5–8.1)

## 2019-01-12 LAB — CBC WITH DIFFERENTIAL (CANCER CENTER ONLY)
ABS IMMATURE GRANULOCYTES: 0.01 10*3/uL (ref 0.00–0.07)
BASOS ABS: 0 10*3/uL (ref 0.0–0.1)
Basophils Relative: 1 %
Eosinophils Absolute: 0.1 10*3/uL (ref 0.0–0.5)
Eosinophils Relative: 1 %
HCT: 48.6 % (ref 39.0–52.0)
Hemoglobin: 16.4 g/dL (ref 13.0–17.0)
Immature Granulocytes: 0 %
LYMPHS ABS: 0.5 10*3/uL — AB (ref 0.7–4.0)
Lymphocytes Relative: 9 %
MCH: 32 pg (ref 26.0–34.0)
MCHC: 33.7 g/dL (ref 30.0–36.0)
MCV: 94.9 fL (ref 80.0–100.0)
Monocytes Absolute: 0.4 10*3/uL (ref 0.1–1.0)
Monocytes Relative: 9 %
NEUTROS PCT: 80 %
Neutro Abs: 3.9 10*3/uL (ref 1.7–7.7)
Platelet Count: 189 10*3/uL (ref 150–400)
RBC: 5.12 MIL/uL (ref 4.22–5.81)
RDW: 11.9 % (ref 11.5–15.5)
WBC Count: 4.9 10*3/uL (ref 4.0–10.5)
nRBC: 0 % (ref 0.0–0.2)

## 2019-01-12 MED ORDER — HEPARIN SOD (PORK) LOCK FLUSH 100 UNIT/ML IV SOLN
500.0000 [IU] | Freq: Once | INTRAVENOUS | Status: AC | PRN
  Administered 2019-01-12: 500 [IU]
  Filled 2019-01-12: qty 5

## 2019-01-12 MED ORDER — SODIUM CHLORIDE 0.9 % IV SOLN
Freq: Once | INTRAVENOUS | Status: AC
  Administered 2019-01-12: 10:00:00 via INTRAVENOUS
  Filled 2019-01-12: qty 250

## 2019-01-12 MED ORDER — SODIUM CHLORIDE 0.9 % IV SOLN
200.0000 mg | Freq: Once | INTRAVENOUS | Status: AC
  Administered 2019-01-12: 200 mg via INTRAVENOUS
  Filled 2019-01-12: qty 8

## 2019-01-12 MED ORDER — SODIUM CHLORIDE 0.9% FLUSH
10.0000 mL | Freq: Once | INTRAVENOUS | Status: AC
  Administered 2019-01-12: 10 mL
  Filled 2019-01-12: qty 10

## 2019-01-12 MED ORDER — SODIUM CHLORIDE 0.9% FLUSH
10.0000 mL | INTRAVENOUS | Status: DC | PRN
  Administered 2019-01-12: 10 mL
  Filled 2019-01-12: qty 10

## 2019-01-12 NOTE — Telephone Encounter (Signed)
Scheduled additional cycles per 3/4 los - pt to get an updated schedule next visit.

## 2019-01-12 NOTE — Patient Instructions (Signed)
[facility] Discharge Instructions for Patients Receiving Chemotherapy  Today you received the following chemotherapy agents:  Keytruda.  To help prevent nausea and vomiting after your treatment, we encourage you to take your nausea medication as directed.   If you develop nausea and vomiting that is not controlled by your nausea medication, call the clinic.   BELOW ARE SYMPTOMS THAT SHOULD BE REPORTED IMMEDIATELY:  *FEVER GREATER THAN 100.5 F  *CHILLS WITH OR WITHOUT FEVER  NAUSEA AND VOMITING THAT IS NOT CONTROLLED WITH YOUR NAUSEA MEDICATION  *UNUSUAL SHORTNESS OF BREATH  *UNUSUAL BRUISING OR BLEEDING  TENDERNESS IN MOUTH AND THROAT WITH OR WITHOUT PRESENCE OF ULCERS  *URINARY PROBLEMS  *BOWEL PROBLEMS  UNUSUAL RASH Items with * indicate a potential emergency and should be followed up as soon as possible.  Feel free to call the clinic should you have any questions or concerns. The clinic phone number is [phone].  Please show the CHEMO ALERT CARD at check-in to the Emergency Department and triage nurse.    

## 2019-01-12 NOTE — Progress Notes (Signed)
[facility] Telephone:(336) [phone]   Fax:(336) [phone]  OFFICE PROGRESS NOTE  [name], [name], MD [place] [address]  DIAGNOSIS:  1) Stage IIB/IV (T3, N3, M1a) non-small cell lung cancer, squamous cell carcinoma presented with large left hilar mass in addition to mediastinal and left supraclavicular lymphadenopathy as well as contralateral right upper lobe nodule diagnosed in August 2018. PDL 1 expression: 90%. 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 First dose 06/29/2017. Status post 6 cycles. Last dose was given 08/03/2017.  CURRENT THERAPY: Immunotherapy with Ketruda 200 MG IV every 3 weeks, first dose 09/17/2017.  Status post 23 cycles.  INTERVAL HISTORY: Trevor Zavala 53 y.o. male returns to the clinic today for follow-up visit.  The patient is feeling fine today with no concerning complaints.  He denied having any chest pain, shortness of breath, cough or hemoptysis.  He continues to have mild hoarseness of his voice.  He denied having any nausea, vomiting, diarrhea or constipation.  He lost 2 pounds since his last visit.  The patient continues to tolerate his treatment with [facility] fairly well.  He is here for evaluation before starting cycle #24.   MEDICAL HISTORY: Past Medical History:  Diagnosis Date  . Anxiety   . COPD (chronic obstructive pulmonary disease) ([facility])   . GERD (gastroesophageal reflux disease)   . Headache   . Hypertension   . Pneumonia   . Situational depression   . Stage III squamous cell carcinoma of left lung ([facility]) 06/19/2017   Lungs & Epiglottis  . Tubular adenoma of colon 01/2015  . Tubular adenoma of colon 01/2015    ALLERGIES:  is allergic to escitalopram oxalate.  MEDICATIONS:  Current Outpatient Medications  Medication Sig Dispense Refill  . acyclovir (ZOVIRAX) 400  MG tablet Take 1 tablet (400 mg total) by mouth 3 (three) times daily. 21 tablet 3  . clonazePAM (KLONOPIN) 0.5 MG tablet TAKE 1 TO 2 TABLETS        (0.5-1MG  TOTAL) TWICE A DAYAS NEEDED FOR ANXIETY 180 tablet 1  . diphenhydrAMINE (BENADRYL) 25 MG tablet Take 12.5 mg by mouth every 6 (six) hours as needed for itching.    . esomeprazole (NEXIUM) 40 MG capsule 1 po qam 90 capsule 3  . fexofenadine-pseudoephedrine (ALLEGRA-D 24) 180-240 MG 24 hr tablet Take 1 tablet by mouth daily.    . fluticasone furoate-vilanterol (BREO ELLIPTA) 100-25 MCG/INH AEPB INHALE 1 PUFF INTO THE LUNGS DAILY. 3 each 3  . Multiple Vitamin (MULTIVITAMIN WITH MINERALS) TABS tablet Take 1 tablet by mouth daily.    . pembrolizumab (KEYTRUDA) 100 MG/4ML SOLN Inject 2 mg/kg into the vein. 200mg  every 3 weeks.     No current facility-administered medications for this visit.     SURGICAL HISTORY:  Past Surgical History:  Procedure Laterality Date  . COLONOSCOPY    . DIRECT LARYNGOSCOPY N/A 06/25/2017   Procedure: DIRECT LARYNGOSCOPY AND BIOPSY;  Surgeon: Rozetta Nunnery, MD;  Location: [facility];  Service: ENT;  Laterality: N/A;  . EAR CYST EXCISION N/A 11/17/2013   Procedure: SEBACEOUS CYST CHEST;  Surgeon: Joyice Faster. Cornett, MD;  Location: [facility];  Service: General;  Laterality: N/A;  . IR GASTROSTOMY TUBE MOD SED  08/10/2017  . IR GASTROSTOMY TUBE REMOVAL  09/29/2017  . KNEE ARTHROSCOPY  LEFT  . LIPOMA EXCISION N/A 11/17/2013   Procedure: EXCISION LIPOMA FOREHEAD;  Surgeon: Joyice Faster. Cornett, MD;  Location: [facility];  Service: General;  Laterality: N/A;  . LUNG BIOPSY Bilateral 06/12/2017   Procedure: LEFT LUNG BIOPSY;  Surgeon: Grace Isaac, MD;  Location: [facility];  Service: Thoracic;  Laterality: Bilateral;  . MICROLARYNGOSCOPY W/VOCAL CORD INJECTION Left 07/16/2018   Procedure: suspened micro laryngoscopy with jet ventilation and prolaryn injection;  Surgeon:  Melida Quitter, MD;  Location: [facility];  Service: ENT;  Laterality: Left;  . PORTACATH PLACEMENT Right 07/01/2017   Procedure: INSERTION PORT-A-CATH - RIGHT IJ - placed with Fluoro and Ultrasound;  Surgeon: Grace Isaac, MD;  Location: [facility];  Service: Thoracic;  Laterality: Right;  Marland Kitchen VIDEO BRONCHOSCOPY WITH ENDOBRONCHIAL ULTRASOUND N/A 06/12/2017   Procedure: VIDEO BRONCHOSCOPY WITH ENDOBRONCHIAL ULTRASOUND;  Surgeon: Grace Isaac, MD;  Location: [facility];  Service: Thoracic;  Laterality: N/A;    REVIEW OF SYSTEMS:  A comprehensive review of systems was negative except for: Ears, nose, mouth, throat, and face: positive for hoarseness   PHYSICAL EXAMINATION: General appearance: alert, cooperative and no distress Head: Normocephalic, without obvious abnormality, atraumatic Neck: no adenopathy, no JVD, supple, symmetrical, trachea midline and thyroid not enlarged, symmetric, no tenderness/mass/nodules Lymph nodes: Cervical, supraclavicular, and axillary nodes normal. Resp: clear to auscultation bilaterally Back: symmetric, no curvature. ROM normal. No CVA tenderness. Cardio: regular rate and rhythm, S1, S2 normal, no murmur, click, rub or gallop GI: soft, non-tender; bowel sounds normal; no masses,  no organomegaly Extremities: extremities normal, atraumatic, no cyanosis or edema  ECOG PERFORMANCE STATUS: 1 - Symptomatic but completely ambulatory  Blood pressure 115/79, pulse 82, temperature 98.3 F (36.8 C), temperature source Oral, resp. rate 18, height 5\' 11"  (1.803 m), weight 160 lb 8 oz (72.8 kg), SpO2 97 %.  LABORATORY DATA: Lab Results  Component Value Date   WBC 4.9 01/12/2019   HGB 16.4 01/12/2019   HCT 48.6 01/12/2019   MCV 94.9 01/12/2019   PLT 189 01/12/2019      Chemistry      Component Value Date/Time   NA 141 12/23/2018 0910   NA 141 10/29/2017 1049   K 4.1 12/23/2018 0910   K 3.3 (L) 10/29/2017 1049   CL 106 12/23/2018 0910   CO2 29 12/23/2018 0910   CO2 26  10/29/2017 1049   BUN 13 12/23/2018 0910   BUN 11.2 10/29/2017 1049   CREATININE 0.69 12/23/2018 0910   CREATININE 0.7 10/29/2017 1049   GLU 198 08/25/2017      Component Value Date/Time   CALCIUM 9.1 12/23/2018 0910   CALCIUM 9.0 10/29/2017 1049   ALKPHOS 70 12/23/2018 0910   ALKPHOS 82 10/29/2017 1049   AST 14 (L) 12/23/2018 0910   AST 10 10/29/2017 1049   ALT 8 12/23/2018 0910   ALT 8 10/29/2017 1049   BILITOT 0.5 12/23/2018 0910   BILITOT 0.22 10/29/2017 1049       RADIOGRAPHIC STUDIES: No results found.  ASSESSMENT AND PLAN: This is a very pleasant 53 years old white male with recently diagnosed stage IIIB/IV non-small cell lung cancer, adenocarcinoma presented with large left hilar mass in addition to mediastinal and left supraclavicular lymphadenopathy as well as suspicious right upper lobe pulmonary nodule diagnosed in August 2018. 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 He underwent a course of concurrent chemoradiation to the lung as well as the epiglottic area  under the care of Dr. Tammi Klippel. He is status post 6 cycle. He tolerated this course of treatment well except for the radiation induced esophagitis as well as weight loss and fatigue. The patient had partial response to the previous treatment. He is currently on treatment with Keytruda 200 mg IV every 3 weeks status post 23 cycles. The patient has been tolerating this treatment well with no concerning adverse effects. I recommended for him to proceed with cycle #24 today as scheduled. I will see him back for follow-up visit in 3 weeks for evaluation before starting cycle #25. He was advised to call immediately if he has any concerning symptoms in the interval. The patient voices understanding of current disease status and treatment options and is in agreement with the current care plan. All questions were answered. The patient knows to call the clinic with any problems,  questions or concerns. We can certainly see the patient much sooner if necessary. Disclaimer: This note was dictated with voice recognition software. Similar sounding words can inadvertently be transcribed and may not be corrected upon review.

## 2019-02-02 ENCOUNTER — Other Ambulatory Visit: Payer: Self-pay | Admitting: Medical Oncology

## 2019-02-02 DIAGNOSIS — C321 Malignant neoplasm of supraglottis: Secondary | ICD-10-CM

## 2019-02-02 DIAGNOSIS — R5382 Chronic fatigue, unspecified: Secondary | ICD-10-CM

## 2019-02-03 ENCOUNTER — Inpatient Hospital Stay: Payer: 59

## 2019-02-03 ENCOUNTER — Other Ambulatory Visit: Payer: Self-pay

## 2019-02-03 ENCOUNTER — Inpatient Hospital Stay (HOSPITAL_BASED_OUTPATIENT_CLINIC_OR_DEPARTMENT_OTHER): Payer: 59 | Admitting: Internal Medicine

## 2019-02-03 ENCOUNTER — Encounter: Payer: Self-pay | Admitting: Medical Oncology

## 2019-02-03 ENCOUNTER — Encounter: Payer: Self-pay | Admitting: Internal Medicine

## 2019-02-03 VITALS — BP 117/78 | HR 81 | Temp 98.4°F | Resp 18 | Ht 71.0 in | Wt 160.3 lb

## 2019-02-03 DIAGNOSIS — I10 Essential (primary) hypertension: Secondary | ICD-10-CM | POA: Diagnosis not present

## 2019-02-03 DIAGNOSIS — Z5112 Encounter for antineoplastic immunotherapy: Secondary | ICD-10-CM | POA: Diagnosis not present

## 2019-02-03 DIAGNOSIS — J449 Chronic obstructive pulmonary disease, unspecified: Secondary | ICD-10-CM

## 2019-02-03 DIAGNOSIS — C321 Malignant neoplasm of supraglottis: Secondary | ICD-10-CM | POA: Diagnosis not present

## 2019-02-03 DIAGNOSIS — F419 Anxiety disorder, unspecified: Secondary | ICD-10-CM | POA: Diagnosis not present

## 2019-02-03 DIAGNOSIS — C3412 Malignant neoplasm of upper lobe, left bronchus or lung: Secondary | ICD-10-CM

## 2019-02-03 DIAGNOSIS — Z79899 Other long term (current) drug therapy: Secondary | ICD-10-CM

## 2019-02-03 DIAGNOSIS — C3490 Malignant neoplasm of unspecified part of unspecified bronchus or lung: Secondary | ICD-10-CM

## 2019-02-03 DIAGNOSIS — Z95828 Presence of other vascular implants and grafts: Secondary | ICD-10-CM

## 2019-02-03 DIAGNOSIS — R5382 Chronic fatigue, unspecified: Secondary | ICD-10-CM

## 2019-02-03 LAB — CMP (CANCER CENTER ONLY)
ALT: 15 U/L (ref 0–44)
AST: 17 U/L (ref 15–41)
Albumin: 3.9 g/dL (ref 3.5–5.0)
Alkaline Phosphatase: 62 U/L (ref 38–126)
Anion gap: 7 (ref 5–15)
BUN: 9 mg/dL (ref 6–20)
CO2: 27 mmol/L (ref 22–32)
Calcium: 8.9 mg/dL (ref 8.9–10.3)
Chloride: 105 mmol/L (ref 98–111)
Creatinine: 0.67 mg/dL (ref 0.61–1.24)
GFR, Est AFR Am: >60 mL/min (ref >60)
GFR, Estimated: >60 mL/min (ref >60)
GLUCOSE: 93 mg/dL (ref 70–99)
Potassium: 4.3 mmol/L (ref 3.5–5.1)
SODIUM: 139 mmol/L (ref 135–145)
Total Bilirubin: 0.4 mg/dL (ref 0.3–1.2)
Total Protein: 6.5 g/dL (ref 6.5–8.1)

## 2019-02-03 LAB — CBC WITH DIFFERENTIAL (CANCER CENTER ONLY)
Abs Immature Granulocytes: 0.02 10*3/uL (ref 0.00–0.07)
Basophils Absolute: 0 10*3/uL (ref 0.0–0.1)
Basophils Relative: 1 %
Eosinophils Absolute: 0 10*3/uL (ref 0.0–0.5)
Eosinophils Relative: 1 %
HCT: 47.6 % (ref 39.0–52.0)
Hemoglobin: 16 g/dL (ref 13.0–17.0)
Immature Granulocytes: 0 %
LYMPHS PCT: 10 %
Lymphs Abs: 0.5 10*3/uL — ABNORMAL LOW (ref 0.7–4.0)
MCH: 32.4 pg (ref 26.0–34.0)
MCHC: 33.6 g/dL (ref 30.0–36.0)
MCV: 96.4 fL (ref 80.0–100.0)
Monocytes Absolute: 0.4 10*3/uL (ref 0.1–1.0)
Monocytes Relative: 9 %
Neutro Abs: 3.7 10*3/uL (ref 1.7–7.7)
Neutrophils Relative %: 79 %
Platelet Count: 190 10*3/uL (ref 150–400)
RBC: 4.94 MIL/uL (ref 4.22–5.81)
RDW: 12.1 % (ref 11.5–15.5)
WBC Count: 4.6 10*3/uL (ref 4.0–10.5)
nRBC: 0 % (ref 0.0–0.2)

## 2019-02-03 LAB — TSH: TSH: 2.426 u[IU]/mL (ref 0.320–4.118)

## 2019-02-03 MED ORDER — HEPARIN SOD (PORK) LOCK FLUSH 100 UNIT/ML IV SOLN
500.0000 [IU] | Freq: Once | INTRAVENOUS | Status: AC | PRN
  Administered 2019-02-03: 500 [IU]
  Filled 2019-02-03: qty 5

## 2019-02-03 MED ORDER — SODIUM CHLORIDE 0.9% FLUSH
10.0000 mL | Freq: Once | INTRAVENOUS | Status: AC
  Administered 2019-02-03: 10 mL
  Filled 2019-02-03: qty 10

## 2019-02-03 MED ORDER — SODIUM CHLORIDE 0.9 % IV SOLN
Freq: Once | INTRAVENOUS | Status: AC
  Administered 2019-02-03: 10:00:00 via INTRAVENOUS
  Filled 2019-02-03: qty 250

## 2019-02-03 MED ORDER — SODIUM CHLORIDE 0.9 % IV SOLN
200.0000 mg | Freq: Once | INTRAVENOUS | Status: AC
  Administered 2019-02-03: 200 mg via INTRAVENOUS
  Filled 2019-02-03: qty 8

## 2019-02-03 MED ORDER — SODIUM CHLORIDE 0.9% FLUSH
10.0000 mL | INTRAVENOUS | Status: DC | PRN
  Administered 2019-02-03: 10 mL
  Filled 2019-02-03: qty 10

## 2019-02-03 NOTE — Patient Instructions (Signed)
[facility] Discharge Instructions for Patients Receiving Chemotherapy  Today you received the following chemotherapy agents:  Keytruda.  To help prevent nausea and vomiting after your treatment, we encourage you to take your nausea medication as directed.   If you develop nausea and vomiting that is not controlled by your nausea medication, call the clinic.   BELOW ARE SYMPTOMS THAT SHOULD BE REPORTED IMMEDIATELY:  *FEVER GREATER THAN 100.5 F  *CHILLS WITH OR WITHOUT FEVER  NAUSEA AND VOMITING THAT IS NOT CONTROLLED WITH YOUR NAUSEA MEDICATION  *UNUSUAL SHORTNESS OF BREATH  *UNUSUAL BRUISING OR BLEEDING  TENDERNESS IN MOUTH AND THROAT WITH OR WITHOUT PRESENCE OF ULCERS  *URINARY PROBLEMS  *BOWEL PROBLEMS  UNUSUAL RASH Items with * indicate a potential emergency and should be followed up as soon as possible.  Feel free to call the clinic should you have any questions or concerns. The clinic phone number is [phone].  Please show the CHEMO ALERT CARD at check-in to the Emergency Department and triage nurse.    

## 2019-02-03 NOTE — Progress Notes (Signed)
[place] [phone]   [phone]  OFFICE PROGRESS NOTE  [name], [name], MD [place] [address]  DIAGNOSIS:  1) Stage IIB/IV (T3, N3, M1a) non-small cell lung cancer, squamous cell carcinoma presented with large left hilar mass in addition to mediastinal and left supraclavicular lymphadenopathy as well as contralateral right upper lobe nodule diagnosed in August 2018. PDL 1 expression: 90%. 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 First dose 06/29/2017. Status post 6 cycles. Last dose was given 08/03/2017.  CURRENT THERAPY: Immunotherapy with Ketruda 200 MG IV every 3 weeks, first dose 09/17/2017.  Status post 24 cycles.  INTERVAL HISTORY: Trevor Zavala 53 y.o. male returns to the clinic today for follow-up visit.  The patient is feeling fine today with no concerning complaints.  He denied having any chest pain, shortness of breath, cough or hemoptysis.  He denied having any fever or chills.  He has no nausea, vomiting, diarrhea or constipation.  He has no headache or visual changes.  The patient continues to tolerate his treatment with [facility] fairly well.  He is here today for evaluation before starting cycle #25.  MEDICAL HISTORY: Past Medical History:  Diagnosis Date  . Anxiety   . COPD (chronic obstructive pulmonary disease) ([place])   . GERD (gastroesophageal reflux disease)   . Headache   . Hypertension   . Pneumonia   . Situational depression   . Stage III squamous cell carcinoma of left lung ([place]) 06/19/2017   Lungs & Epiglottis  . Tubular adenoma of colon 01/2015  . Tubular adenoma of colon 01/2015    ALLERGIES:  is allergic to escitalopram oxalate.  MEDICATIONS:  Current Outpatient Medications  Medication Sig Dispense Refill  . acyclovir (ZOVIRAX) 400 MG tablet Take 1  tablet (400 mg total) by mouth 3 (three) times daily. 21 tablet 3  . clonazePAM (KLONOPIN) 0.5 MG tablet TAKE 1 TO 2 TABLETS        (0.5-1MG  TOTAL) TWICE A DAYAS NEEDED FOR ANXIETY 180 tablet 1  . diphenhydrAMINE (BENADRYL) 25 MG tablet Take 12.5 mg by mouth every 6 (six) hours as needed for itching.    . esomeprazole (NEXIUM) 40 MG capsule 1 po qam 90 capsule 3  . fexofenadine-pseudoephedrine (ALLEGRA-D 24) 180-240 MG 24 hr tablet Take 1 tablet by mouth daily.    . fluticasone furoate-vilanterol (BREO ELLIPTA) 100-25 MCG/INH AEPB INHALE 1 PUFF INTO THE LUNGS DAILY. 3 each 3  . Multiple Vitamin (MULTIVITAMIN WITH MINERALS) TABS tablet Take 1 tablet by mouth daily.    . pembrolizumab (KEYTRUDA) 100 MG/4ML SOLN Inject 2 mg/kg into the vein. 200mg  every 3 weeks.     No current facility-administered medications for this visit.     SURGICAL HISTORY:  Past Surgical History:  Procedure Laterality Date  . COLONOSCOPY    . DIRECT LARYNGOSCOPY N/A 06/25/2017   Procedure: DIRECT LARYNGOSCOPY AND BIOPSY;  Surgeon: Rozetta Nunnery, MD;  Location[place] [facility]  Service: ENT;  Laterality: N/A;  . EAR CYST EXCISION N/A 11/17/2013   Procedure: SEBACEOUS CYST CHEST;  Surgeon: Joyice Faster. Cornett, MD;  Location: [facility];  Service: General;  Laterality: N/A;  . IR GASTROSTOMY TUBE MOD SED  08/10/2017  . IR GASTROSTOMY TUBE REMOVAL  09/29/2017  . KNEE ARTHROSCOPY     LEFT  .  LIPOMA EXCISION N/A 11/17/2013   Procedure: EXCISION LIPOMA FOREHEAD;  Surgeon: Joyice Faster. Cornett, MD;  Location: [place];  Service: General;  Laterality: N/A;  . LUNG BIOPSY Bilateral 06/12/2017   Procedure: LEFT LUNG BIOPSY;  Surgeon: Grace Isaac, MD;  Location: [place];  Service: Thoracic;  Laterality: Bilateral;  . MICROLARYNGOSCOPY W/VOCAL CORD INJECTION Left 07/16/2018   Procedure: suspened micro laryngoscopy with jet ventilation and prolaryn injection;  Surgeon: Melida Quitter, MD;   Location: [place] [facility];  Service: ENT;  Laterality: Left;  . PORTACATH PLACEMENT Right 07/01/2017   Procedure: INSERTION PORT-A-CATH - RIGHT IJ - placed with Fluoro and Ultrasound;  Surgeon: Grace Isaac, MD;  Location: [place];  Service: Thoracic;  Laterality: Right;  Marland Kitchen VIDEO BRONCHOSCOPY WITH ENDOBRONCHIAL ULTRASOUND N/A 06/12/2017   Procedure: VIDEO BRONCHOSCOPY WITH ENDOBRONCHIAL ULTRASOUND;  Surgeon: Grace Isaac, MD;  Location: [place];  Service: Thoracic;  Laterality: N/A;    REVIEW OF SYSTEMS:  A comprehensive review of systems was negative except for: Constitutional: positive for fatigue   PHYSICAL EXAMINATION: General appearance: alert, cooperative and no distress Head: Normocephalic, without obvious abnormality, atraumatic Neck: no adenopathy, no JVD, supple, symmetrical, trachea midline and thyroid not enlarged, symmetric, no tenderness/mass/nodules Lymph nodes: Cervical, supraclavicular, and axillary nodes normal. Resp: clear to auscultation bilaterally Back: symmetric, no curvature. ROM normal. No CVA tenderness. Cardio: regular rate and rhythm, S1, S2 normal, no murmur, click, rub or gallop GI: soft, non-tender; bowel sounds normal; no masses,  no organomegaly Extremities: extremities normal, atraumatic, no cyanosis or edema  ECOG PERFORMANCE STATUS: 1 - Symptomatic but completely ambulatory  Blood pressure 117/78, pulse 81, temperature 98.4 F (36.9 C), temperature source Oral, resp. rate 18, height 5\' 11"  (1.803 m), weight 160 lb 4.8 oz (72.7 kg), SpO2 99 %.  LABORATORY DATA: Lab Results  Component Value Date   WBC 4.6 02/03/2019   HGB 16.0 02/03/2019   HCT 47.6 02/03/2019   MCV 96.4 02/03/2019   PLT 190 02/03/2019      Chemistry      Component Value Date/Time   NA 139 02/03/2019 0836   NA 141 10/29/2017 1049   K 4.3 02/03/2019 0836   K 3.3 (L) 10/29/2017 1049   CL 105 02/03/2019 0836   CO2 27 02/03/2019 0836   CO2 26 10/29/2017 1049   BUN 9 02/03/2019 0836    BUN 11.2 10/29/2017 1049   CREATININE 0.67 02/03/2019 0836   CREATININE 0.7 10/29/2017 1049   GLU 198 08/25/2017      Component Value Date/Time   CALCIUM 8.9 02/03/2019 0836   CALCIUM 9.0 10/29/2017 1049   ALKPHOS 62 02/03/2019 0836   ALKPHOS 82 10/29/2017 1049   AST 17 02/03/2019 0836   AST 10 10/29/2017 1049   ALT 15 02/03/2019 0836   ALT 8 10/29/2017 1049   BILITOT 0.4 02/03/2019 0836   BILITOT 0.22 10/29/2017 1049       RADIOGRAPHIC STUDIES: No results found.  ASSESSMENT AND PLAN: This is a very pleasant 53 years old white male with recently diagnosed stage IIIB/IV non-small cell lung cancer, adenocarcinoma presented with large left hilar mass in addition to mediastinal and left supraclavicular lymphadenopathy as well as suspicious right upper lobe pulmonary nodule diagnosed in August 2018. 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 He underwent a course of concurrent chemoradiation to the lung as well as the epiglottic area under the care of Dr. Tammi Klippel. He is status  post 6 cycle. He tolerated this course of treatment well except for the radiation induced esophagitis as well as weight loss and fatigue. The patient had partial response to the previous treatment. He is currently on treatment with Keytruda 200 mg IV every 3 weeks status post 24 cycles. The patient has been tolerating this treatment well with no concerning adverse effects. I recommended for him to proceed with cycle #25 today as scheduled. I will see the patient back for follow-up visit in 3 weeks for evaluation with repeat CT scan of the chest, abdomen and pelvis for restaging of his disease. The patient was advised to call immediately if he has any concerning symptoms in the interval. The patient voices understanding of current disease status and treatment options and is in agreement with the current care plan. All questions were answered. The patient knows to call  the clinic with any problems, questions or concerns. We can certainly see the patient much sooner if necessary. Disclaimer: This note was dictated with voice recognition software. Similar sounding words can inadvertently be transcribed and may not be corrected upon review.

## 2019-02-14 ENCOUNTER — Telehealth: Payer: Self-pay | Admitting: *Deleted

## 2019-02-14 NOTE — Telephone Encounter (Signed)
Medical records faxed to [facility]; [place] [phone]

## 2019-02-22 ENCOUNTER — Other Ambulatory Visit: Payer: Self-pay

## 2019-02-22 ENCOUNTER — Ambulatory Visit (HOSPITAL_COMMUNITY)
Admission: RE | Admit: 2019-02-22 | Discharge: 2019-02-22 | Disposition: A | Discharge status: Home / Self Care | Payer: 59 | Source: Ambulatory Visit | Attending: Internal Medicine | Admitting: Internal Medicine

## 2019-02-22 DIAGNOSIS — C3490 Malignant neoplasm of unspecified part of unspecified bronchus or lung: Secondary | ICD-10-CM | POA: Diagnosis present

## 2019-02-22 MED ORDER — IOHEXOL 300 MG/ML  SOLN
100.0000 mL | Freq: Once | INTRAMUSCULAR | Status: AC | PRN
  Administered 2019-02-22: 100 mL via INTRAVENOUS

## 2019-02-22 MED ORDER — SODIUM CHLORIDE (PF) 0.9 % IJ SOLN
INTRAMUSCULAR | Status: AC
  Filled 2019-02-22: qty 50

## 2019-02-24 ENCOUNTER — Inpatient Hospital Stay: Payer: 59

## 2019-02-24 ENCOUNTER — Inpatient Hospital Stay (HOSPITAL_BASED_OUTPATIENT_CLINIC_OR_DEPARTMENT_OTHER): Payer: 59 | Admitting: Physician Assistant

## 2019-02-24 ENCOUNTER — Other Ambulatory Visit: Payer: Self-pay

## 2019-02-24 ENCOUNTER — Inpatient Hospital Stay: Payer: 59 | Attending: Internal Medicine

## 2019-02-24 ENCOUNTER — Encounter: Payer: Self-pay | Admitting: Physician Assistant

## 2019-02-24 VITALS — BP 123/84 | HR 84 | Temp 97.9°F | Resp 18 | Ht 71.0 in | Wt 159.3 lb

## 2019-02-24 DIAGNOSIS — C3412 Malignant neoplasm of upper lobe, left bronchus or lung: Secondary | ICD-10-CM | POA: Insufficient documentation

## 2019-02-24 DIAGNOSIS — F419 Anxiety disorder, unspecified: Secondary | ICD-10-CM | POA: Diagnosis not present

## 2019-02-24 DIAGNOSIS — C321 Malignant neoplasm of supraglottis: Secondary | ICD-10-CM | POA: Insufficient documentation

## 2019-02-24 DIAGNOSIS — Z5112 Encounter for antineoplastic immunotherapy: Secondary | ICD-10-CM | POA: Insufficient documentation

## 2019-02-24 DIAGNOSIS — J449 Chronic obstructive pulmonary disease, unspecified: Secondary | ICD-10-CM | POA: Diagnosis not present

## 2019-02-24 DIAGNOSIS — Z79899 Other long term (current) drug therapy: Secondary | ICD-10-CM

## 2019-02-24 DIAGNOSIS — Z95828 Presence of other vascular implants and grafts: Secondary | ICD-10-CM

## 2019-02-24 DIAGNOSIS — I10 Essential (primary) hypertension: Secondary | ICD-10-CM

## 2019-02-24 DIAGNOSIS — R5382 Chronic fatigue, unspecified: Secondary | ICD-10-CM

## 2019-02-24 LAB — CBC WITH DIFFERENTIAL (CANCER CENTER ONLY)
Abs Immature Granulocytes: 0.01 10*3/uL (ref 0.00–0.07)
Basophils Absolute: 0 10*3/uL (ref 0.0–0.1)
Basophils Relative: 1 %
Eosinophils Absolute: 0.1 10*3/uL (ref 0.0–0.5)
Eosinophils Relative: 1 %
HCT: 48.8 % (ref 39.0–52.0)
Hemoglobin: 16.3 g/dL (ref 13.0–17.0)
Immature Granulocytes: 0 %
Lymphocytes Relative: 8 %
Lymphs Abs: 0.4 10*3/uL — ABNORMAL LOW (ref 0.7–4.0)
MCH: 32.1 pg (ref 26.0–34.0)
MCHC: 33.4 g/dL (ref 30.0–36.0)
MCV: 96.1 fL (ref 80.0–100.0)
Monocytes Absolute: 0.4 10*3/uL (ref 0.1–1.0)
Monocytes Relative: 9 %
Neutro Abs: 3.5 10*3/uL (ref 1.7–7.7)
Neutrophils Relative %: 81 %
Platelet Count: 191 10*3/uL (ref 150–400)
RBC: 5.08 MIL/uL (ref 4.22–5.81)
RDW: 12.1 % (ref 11.5–15.5)
WBC Count: 4.4 10*3/uL (ref 4.0–10.5)
nRBC: 0 % (ref 0.0–0.2)

## 2019-02-24 LAB — CMP (CANCER CENTER ONLY)
ALT: 12 U/L (ref 0–44)
AST: 18 U/L (ref 15–41)
Albumin: 3.8 g/dL (ref 3.5–5.0)
Alkaline Phosphatase: 64 U/L (ref 38–126)
Anion gap: 10 (ref 5–15)
BUN: 7 mg/dL (ref 6–20)
CO2: 24 mmol/L (ref 22–32)
Calcium: 8.8 mg/dL — ABNORMAL LOW (ref 8.9–10.3)
Chloride: 106 mmol/L (ref 98–111)
Creatinine: 0.71 mg/dL (ref 0.61–1.24)
GFR, Est AFR Am: >60 mL/min (ref >60)
GFR, Estimated: >60 mL/min (ref >60)
Glucose, Bld: 101 mg/dL — ABNORMAL HIGH (ref 70–99)
Potassium: 4.3 mmol/L (ref 3.5–5.1)
Sodium: 140 mmol/L (ref 135–145)
Total Bilirubin: 0.3 mg/dL (ref 0.3–1.2)
Total Protein: 6.7 g/dL (ref 6.5–8.1)

## 2019-02-24 LAB — TSH: TSH: 3.863 u[IU]/mL (ref 0.320–4.118)

## 2019-02-24 MED ORDER — HEPARIN SOD (PORK) LOCK FLUSH 100 UNIT/ML IV SOLN
500.0000 [IU] | Freq: Once | INTRAVENOUS | Status: AC | PRN
  Administered 2019-02-24: 500 [IU]
  Filled 2019-02-24: qty 5

## 2019-02-24 MED ORDER — SODIUM CHLORIDE 0.9 % IV SOLN
Freq: Once | INTRAVENOUS | Status: AC
  Administered 2019-02-24: 09:00:00 via INTRAVENOUS
  Filled 2019-02-24: qty 250

## 2019-02-24 MED ORDER — SODIUM CHLORIDE 0.9% FLUSH
10.0000 mL | INTRAVENOUS | Status: DC | PRN
  Administered 2019-02-24: 10 mL
  Filled 2019-02-24: qty 10

## 2019-02-24 MED ORDER — SODIUM CHLORIDE 0.9 % IV SOLN
200.0000 mg | Freq: Once | INTRAVENOUS | Status: AC
  Administered 2019-02-24: 200 mg via INTRAVENOUS
  Filled 2019-02-24: qty 8

## 2019-02-24 MED ORDER — SODIUM CHLORIDE 0.9% FLUSH
10.0000 mL | Freq: Once | INTRAVENOUS | Status: AC
  Administered 2019-02-24: 10 mL
  Filled 2019-02-24: qty 10

## 2019-02-24 NOTE — Patient Instructions (Signed)
[facility] Discharge Instructions for Patients Receiving Chemotherapy  Today you received the following chemotherapy agents Pembrolizumab (KEYTRUDA).  To help prevent nausea and vomiting after your treatment, we encourage you to take your nausea medication as prescribed.   If you develop nausea and vomiting that is not controlled by your nausea medication, call the clinic.   BELOW ARE SYMPTOMS THAT SHOULD BE REPORTED IMMEDIATELY:  *FEVER GREATER THAN 100.5 F  *CHILLS WITH OR WITHOUT FEVER  NAUSEA AND VOMITING THAT IS NOT CONTROLLED WITH YOUR NAUSEA MEDICATION  *UNUSUAL SHORTNESS OF BREATH  *UNUSUAL BRUISING OR BLEEDING  TENDERNESS IN MOUTH AND THROAT WITH OR WITHOUT PRESENCE OF ULCERS  *URINARY PROBLEMS  *BOWEL PROBLEMS  UNUSUAL RASH Items with * indicate a potential emergency and should be followed up as soon as possible.  Feel free to call the clinic should you have any questions or concerns. The clinic phone number is [phone].  Please show the CHEMO ALERT CARD at check-in to the Emergency Department and triage nurse.  Coronavirus (COVID-19) Are you at risk?  Are you at risk for the Coronavirus (COVID-19)?  To be considered HIGH RISK for Coronavirus (COVID-19), you have to meet the following criteria:  . Traveled to [place], [place], [place], [place] or [place]; or in the [place] to [place], [place], [place], or [place]; and have fever, cough, and shortness of breath within the last 2 weeks of travel OR . Been in close contact with a person diagnosed with COVID-19 within the last 2 weeks and have fever, cough, and shortness of breath . IF YOU DO NOT MEET THESE CRITERIA, YOU ARE CONSIDERED LOW RISK FOR COVID-19.  What to do if you are HIGH RISK for COVID-19?  . If you are having a medical emergency, call 911. . Seek medical care right away. Before you go to a doctor's office, urgent care or emergency department, call ahead and tell  them about your recent travel, contact with someone diagnosed with COVID-19, and your symptoms. You should receive instructions from your physician's office regarding next steps of care.  . When you arrive at healthcare provider, tell the healthcare staff immediately you have returned from visiting [place], [place], [place], [place] or [place]; or traveled in the [place] to [place], [place], [place], or [place]; in the last two weeks or you have been in close contact with a person diagnosed with COVID-19 in the last 2 weeks.   . Tell the health care staff about your symptoms: fever, cough and shortness of breath. . After you have been seen by a medical provider, you will be either: o Tested for (COVID-19) and discharged home on quarantine except to seek medical care if symptoms worsen, and asked to  - Stay home and avoid contact with others until you get your results (4-5 days)  - Avoid travel on public transportation if possible (such as bus, train, or airplane) or o Sent to the Emergency Department by EMS for evaluation, COVID-19 testing, and possible admission depending on your condition and test results.  What to do if you are LOW RISK for COVID-19?  Reduce your risk of any infection by using the same precautions used for avoiding the common cold or flu:  . Wash your hands often with soap and warm water for at least 20 seconds.  If soap and water are not readily available, use an alcohol-based hand sanitizer with at least 60% alcohol.  . If coughing or   sneezing, cover your mouth and nose by coughing or sneezing into the elbow areas of your shirt or coat, into a tissue or into your sleeve (not your hands). . Avoid shaking hands with others and consider head nods or verbal greetings only. . Avoid touching your eyes, nose, or mouth with unwashed hands.  . Avoid close contact with people who are sick. . Avoid places or events with large numbers of people in one location, like concerts or  sporting events. . Carefully consider travel plans you have or are making. . If you are planning any travel outside or inside the [place], visit the CDC's Travelers' Health webpage for the latest health notices. . If you have some symptoms but not all symptoms, continue to monitor at home and seek medical attention if your symptoms worsen. . If you are having a medical emergency, call 911.   ADDITIONAL HEALTHCARE OPTIONS FOR PATIENTS  [facility] / e-Visit: [email or web address]         [facility]: [phone]  [facility]: [phone]                   [facility]: [phone]    

## 2019-02-24 NOTE — Progress Notes (Signed)
[facility] OFFICE PROGRESS NOTE  [name], [name], MD [place] [address]  DIAGNOSIS:  1) Stage IIB/IV (T3, N3, M1a) non-small cell lung cancer, squamous cell carcinoma presented with large left hilar mass in addition to mediastinal and left supraclavicular lymphadenopathy as well as contralateral right upper lobe nodule diagnosed in August 2018. PDL 1 expression: 90%. 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 First dose 06/29/2017. Status post 6 cycles. Last dose was given 08/03/2017.  CURRENT THERAPY: Immunotherapy with Ketruda 200 MG IV every 3 weeks, first dose 09/17/2017.  Status post 25 cycles.  INTERVAL HISTORY: Trevor Zavala 53 y.o. male returns to the clinic for a follow-up visit.  The patient is feeling well today without any concerning complaints except for some seasonal allergies for which he takes an antihistamine.  He denies any fever, chills, night sweats, lymphadenopathy, or weight loss.  He denies any chest pain, shortness of breath, cough, or hemoptysis.  He denies any nausea, vomiting, diarrhea, or constipation.  He denies any headache or visual changes.  He denies any rashes or skin changes.  He denies any dysphagia or odynophagia.  He recently had a restaging CT scan performed and is here today for evaluation and to review his scan result prior to starting cycle #26.  MEDICAL HISTORY: Past Medical History:  Diagnosis Date  . Anxiety   . COPD (chronic obstructive pulmonary disease) ([place])   . GERD (gastroesophageal reflux disease)   . Headache   . Hypertension   . Pneumonia   . Situational depression   . Stage III squamous cell carcinoma of left lung ([place]) 06/19/2017   Lungs & Epiglottis  . Tubular adenoma of colon 01/2015  . Tubular adenoma of colon 01/2015    ALLERGIES:  is allergic to escitalopram  oxalate.  MEDICATIONS:  Current Outpatient Medications  Medication Sig Dispense Refill  . acyclovir (ZOVIRAX) 400 MG tablet Take 1 tablet (400 mg total) by mouth 3 (three) times daily. 21 tablet 3  . clonazePAM (KLONOPIN) 0.5 MG tablet TAKE 1 TO 2 TABLETS        (0.5-1MG  TOTAL) TWICE A DAYAS NEEDED FOR ANXIETY 180 tablet 1  . diphenhydrAMINE (BENADRYL) 25 MG tablet Take 12.5 mg by mouth every 6 (six) hours as needed for itching.    . esomeprazole (NEXIUM) 40 MG capsule 1 po qam 90 capsule 3  . fexofenadine-pseudoephedrine (ALLEGRA-D 24) 180-240 MG 24 hr tablet Take 1 tablet by mouth daily.    . fluticasone furoate-vilanterol (BREO ELLIPTA) 100-25 MCG/INH AEPB INHALE 1 PUFF INTO THE LUNGS DAILY. 3 each 3  . Multiple Vitamin (MULTIVITAMIN WITH MINERALS) TABS tablet Take 1 tablet by mouth daily.    . pembrolizumab (KEYTRUDA) 100 MG/4ML SOLN Inject 2 mg/kg into the vein. 200mg  every 3 weeks.     No current facility-administered medications for this visit.     SURGICAL HISTORY:  Past Surgical History:  Procedure Laterality Date  . COLONOSCOPY    . DIRECT LARYNGOSCOPY N/A 06/25/2017   Procedure: DIRECT LARYNGOSCOPY AND BIOPSY;  Surgeon: Rozetta Nunnery, MD;  Location: [facility];  Service: ENT;  Laterality: N/A;  . EAR CYST EXCISION N/A 11/17/2013   Procedure: SEBACEOUS CYST CHEST;  Surgeon: Joyice Faster. Cornett, MD;  Location: [facility];  Service: General;  Laterality: N/A;  . IR GASTROSTOMY TUBE MOD  SED  08/10/2017  . IR GASTROSTOMY TUBE REMOVAL  09/29/2017  . KNEE ARTHROSCOPY     LEFT  . LIPOMA EXCISION N/A 11/17/2013   Procedure: EXCISION LIPOMA FOREHEAD;  Surgeon: Joyice Faster. Cornett, MD;  Location: [facility];  Service: General;  Laterality: N/A;  . LUNG BIOPSY Bilateral 06/12/2017   Procedure: LEFT LUNG BIOPSY;  Surgeon: Grace Isaac, MD;  Location: [facility];  Service: Thoracic;  Laterality: Bilateral;  . MICROLARYNGOSCOPY W/VOCAL CORD  INJECTION Left 07/16/2018   Procedure: suspened micro laryngoscopy with jet ventilation and prolaryn injection;  Surgeon: Melida Quitter, MD;  Location: [facility];  Service: ENT;  Laterality: Left;  . PORTACATH PLACEMENT Right 07/01/2017   Procedure: INSERTION PORT-A-CATH - RIGHT IJ - placed with Fluoro and Ultrasound;  Surgeon: Grace Isaac, MD;  Location: [facility];  Service: Thoracic;  Laterality: Right;  Marland Kitchen VIDEO BRONCHOSCOPY WITH ENDOBRONCHIAL ULTRASOUND N/A 06/12/2017   Procedure: VIDEO BRONCHOSCOPY WITH ENDOBRONCHIAL ULTRASOUND;  Surgeon: Grace Isaac, MD;  Location: [facility];  Service: Thoracic;  Laterality: N/A;    REVIEW OF SYSTEMS:   Review of Systems  Constitutional: Negative for appetite change, chills, fatigue, fever and unexpected weight change.  HENT:   Negative for mouth sores, nosebleeds, sore throat and trouble swallowing.   Eyes: Negative for eye problems and icterus.  Respiratory: Negative for cough, hemoptysis, shortness of breath and wheezing.   Cardiovascular: Negative for chest pain and leg swelling.  Gastrointestinal: Negative for abdominal pain, constipation, diarrhea, nausea and vomiting.  Genitourinary: Negative for bladder incontinence, difficulty urinating, dysuria, frequency and hematuria.   Musculoskeletal: Negative for back pain, gait problem, neck pain and neck stiffness.  Skin: Negative for itching and rash.  Neurological: Negative for dizziness, extremity weakness, gait problem, headaches, light-headedness and seizures.  Hematological: Negative for adenopathy. Does not bruise/bleed easily.  Psychiatric/Behavioral: Negative for confusion, depression and sleep disturbance. The patient is not nervous/anxious.     PHYSICAL EXAMINATION:  Blood pressure 123/84, pulse 84, temperature 97.9 F (36.6 C), temperature source Oral, resp. rate 18, height 5\' 11"  (1.803 m), weight 159 lb 4.8 oz (72.3 kg), SpO2 95 %.  ECOG PERFORMANCE STATUS: 1 - Symptomatic but completely  ambulatory  Physical Exam  Constitutional: Oriented to person, place, and time and well-developed, well-nourished, and in no distress. No distress.  HENT:  Head: Normocephalic and atraumatic.  Mouth/Throat: Oropharynx is clear and moist. No oropharyngeal exudate.  Eyes: Conjunctivae are normal. Right eye exhibits no discharge. Left eye exhibits no discharge. No scleral icterus.  Neck: Normal range of motion. Neck supple.  Cardiovascular: Normal rate, regular rhythm, normal heart sounds and intact distal pulses.   Pulmonary/Chest: Effort normal and breath sounds normal. No respiratory distress. No wheezes. No rales.  Abdominal: Soft. Bowel sounds are normal. Exhibits no distension and no mass. There is no tenderness.  Musculoskeletal: Normal range of motion. Exhibits no edema.  Lymphadenopathy:    No cervical adenopathy.  Neurological: Alert and oriented to person, place, and time. Exhibits normal muscle tone. Gait normal. Coordination normal.  Skin: Skin is warm and dry. No rash noted. Not diaphoretic. No erythema. No pallor.  Psychiatric: Mood, memory and judgment normal.  Vitals reviewed.  LABORATORY DATA: Lab Results  Component Value Date   WBC 4.4 02/24/2019   HGB 16.3 02/24/2019   HCT 48.8 02/24/2019   MCV 96.1 02/24/2019   PLT 191 02/24/2019      Chemistry      Component Value Date/Time  NA 139 02/03/2019 0836   NA 141 10/29/2017 1049   K 4.3 02/03/2019 0836   K 3.3 (L) 10/29/2017 1049   CL 105 02/03/2019 0836   CO2 27 02/03/2019 0836   CO2 26 10/29/2017 1049   BUN 9 02/03/2019 0836   BUN 11.2 10/29/2017 1049   CREATININE 0.67 02/03/2019 0836   CREATININE 0.7 10/29/2017 1049   GLU 198 08/25/2017      Component Value Date/Time   CALCIUM 8.9 02/03/2019 0836   CALCIUM 9.0 10/29/2017 1049   ALKPHOS 62 02/03/2019 0836   ALKPHOS 82 10/29/2017 1049   AST 17 02/03/2019 0836   AST 10 10/29/2017 1049   ALT 15 02/03/2019 0836   ALT 8 10/29/2017 1049   BILITOT 0.4  02/03/2019 0836   BILITOT 0.22 10/29/2017 1049       RADIOGRAPHIC STUDIES:  Ct Soft Tissue Neck W Contrast  Result Date: 02/22/2019 CLINICAL DATA:  Treated lung and epiglottis cancer. Patient currently on immunotherapy. EXAM: CT NECK WITH CONTRAST TECHNIQUE: Multidetector CT imaging of the neck was performed using the standard protocol following the bolus administration of intravenous contrast. CONTRAST:  172mL OMNIPAQUE IOHEXOL 300 MG/ML  SOLN COMPARISON:  CT of the neck 11/30/2018 FINDINGS: Pharynx and larynx: No residual mucosal or submucosal lesions are present. Post radiation changes are noted again in the oropharynx and hypopharynx. No discrete mass or recurrent lesion is evident. Epiglottis is within normal limits. Salivary glands: Submandibular and parotid glands are normal bilaterally. Thyroid: Normal Lymph nodes: No significant residual recurrent adenopathy is present. Vascular: Atherosclerotic calcifications are again noted at the carotid bifurcations bilaterally. There is no significant stenosis or change. A right IJ Port-A-Cath is in place. Limited intracranial: Unremarkable Visualized orbits: Globes and orbits are within normal limits. Mastoids and visualized paranasal sinuses: Left mastoid surgery is again noted. A polyp or mucous retention cyst is present along the floor of the left maxillary sinus. The paranasal sinuses and mastoid air cells are otherwise clear. The globes and orbits are within normal limits. Skeleton: Vertebral body heights alignment are maintained. No focal lytic or blastic lesions are present. Upper chest: Post radiation changes are again noted in the perihilar regions bilaterally. IMPRESSION: 1. No evidence for residual recurrent disease within the neck. 2. Post radiation changes noted. Electronically Signed   By: San Morelle M.D.   On: 02/22/2019 08:43   Ct Chest W Contrast  Result Date: 02/22/2019 CLINICAL DATA:  Patient with history of lung and  epiglottis cancer. On immunotherapy. Follow-up exam. EXAM: CT CHEST WITH CONTRAST TECHNIQUE: Multidetector CT imaging of the chest was performed during intravenous contrast administration. CONTRAST:  160mL OMNIPAQUE IOHEXOL 300 MG/ML  SOLN COMPARISON:  Chest CT 11/30/2018 FINDINGS: Cardiovascular: Right anterior chest wall Port-A-Cath is present with tip terminating in the superior vena cava. Normal heart size. Trace fluid superior pericardial recess. Thoracic aortic vascular calcifications. Mediastinum/Nodes: No enlarged axillary, mediastinal or hilar lymphadenopathy. Normal appearance of the esophagus. Lungs/Pleura: Similar-appearing postradiation changes within the paramediastinal left lung. Soft tissue density about the central left upper lobe bronchovascular structures (image 54; series 4) is similar when compared to recent prior exams. Emphysematous change. Upper Abdomen: No acute process. Musculoskeletal: Thoracic spine changes. No aggressive or acute appearing osseous lesions. IMPRESSION: No evidence to suggest recurrence. Similar-appearing soft tissue within central bronchovascular structures favored to represent postradiation changes, recommend attention to this region on follow-up. Aortic Atherosclerosis (ICD10-I70.0). Electronically Signed   By: Lovey Newcomer M.D.   On: 02/22/2019 12:05  ASSESSMENT/PLAN:  This is a very pleasant 53 year old Caucasian male diagnosed with stage IIIb/IV non-small cell lung cancer, squamous cell carcinoma.  He presented with a large left hilar mass in addition to mediastinal and left supraclavicular lymphadenopathy as well as a suspicious right upper lobe pulmonary nodule.  He was diagnosed in August 2018.  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   He previously underwent concurrent chemoradiation to the lung as well as epiglottic area under the care of Dr. Tammi Klippel.  He is status post 6 cycles.  He tolerated treatment well  except for radiation-induced esophagitis as well as weight loss and fatigue.  He had a partial response to treatment. He is currently undergoing immunotherapy with Keytruda 200 mg IV every 3 weeks.  He is status post 25 cycles.  He has been tolerating treatment well without any adverse effects. The patient recently had a restaging CT scan performed.  Dr. Julien Nordmann personally and independently reviewed the scan results and discussed the results with the patient today.  The scan showed no evidence for residual or recurrent disease within the neck or lung.  Dr. Julien Nordmann recommends he proceed with cycle #26 today as scheduled.   We will see him back for a follow-up visit in 3 weeks for evaluation before starting cycle #27.  We will continue taking an antihistamine for seasonal allergies.  The patient was advised to call immediately if he has any concerning symptoms in the interval. The patient voices understanding of current disease status and treatment options and is in agreement with the current care plan. All questions were answered. The patient knows to call the clinic with any problems, questions or concerns. We can certainly see the patient much sooner if necessary  No orders of the defined types were placed in this encounter.    Lakedra Washington L Luanna Weesner, PA-C 02/24/19  ADDENDUM: Hematology/Oncology Attending: I had a face-to-face encounter with the patient today.  I recommended his care plan.  This is a very pleasant 53 years old white male with stage IV non-small cell lung cancer, squamous cell carcinoma. The patient is currently on treatment with immunotherapy with Keytruda status post 25 cycles.  He has been tolerating this treatment well with no concerning adverse effects. He had repeat CT scan of the neck and chest performed recently.  I personally and independently reviewed the scans and discussed the results with the patient today. He has a scan showed no concerning findings for disease  progression. I recommended for the patient to continue his current treatment with immunotherapy and he will proceed with cycle #26 today. We will see him back for follow-up visit and 3 weeks for evaluation before starting the next cycle of his treatment. We will continue to monitor his thyroid function closely during this treatment. The patient was advised to call immediately if he has any concerning symptoms in the interval.  Disclaimer: This note was dictated with voice recognition software. Similar sounding words can inadvertently be transcribed and may be missed upon review. Eilleen Kempf, MD 02/24/19

## 2019-03-17 ENCOUNTER — Inpatient Hospital Stay (HOSPITAL_BASED_OUTPATIENT_CLINIC_OR_DEPARTMENT_OTHER): Payer: 59 | Admitting: Internal Medicine

## 2019-03-17 ENCOUNTER — Inpatient Hospital Stay: Payer: 59 | Attending: Internal Medicine

## 2019-03-17 ENCOUNTER — Inpatient Hospital Stay: Payer: 59

## 2019-03-17 ENCOUNTER — Other Ambulatory Visit: Payer: Self-pay

## 2019-03-17 ENCOUNTER — Encounter: Payer: Self-pay | Admitting: Internal Medicine

## 2019-03-17 VITALS — BP 125/82 | HR 87 | Temp 97.1°F | Resp 18 | Ht 71.0 in | Wt 157.8 lb

## 2019-03-17 DIAGNOSIS — Z79899 Other long term (current) drug therapy: Secondary | ICD-10-CM | POA: Insufficient documentation

## 2019-03-17 DIAGNOSIS — J449 Chronic obstructive pulmonary disease, unspecified: Secondary | ICD-10-CM

## 2019-03-17 DIAGNOSIS — C321 Malignant neoplasm of supraglottis: Secondary | ICD-10-CM | POA: Insufficient documentation

## 2019-03-17 DIAGNOSIS — C3412 Malignant neoplasm of upper lobe, left bronchus or lung: Secondary | ICD-10-CM

## 2019-03-17 DIAGNOSIS — I10 Essential (primary) hypertension: Secondary | ICD-10-CM | POA: Insufficient documentation

## 2019-03-17 DIAGNOSIS — F419 Anxiety disorder, unspecified: Secondary | ICD-10-CM

## 2019-03-17 DIAGNOSIS — Z5112 Encounter for antineoplastic immunotherapy: Secondary | ICD-10-CM | POA: Insufficient documentation

## 2019-03-17 DIAGNOSIS — R5382 Chronic fatigue, unspecified: Secondary | ICD-10-CM

## 2019-03-17 DIAGNOSIS — Z95828 Presence of other vascular implants and grafts: Secondary | ICD-10-CM

## 2019-03-17 LAB — CBC WITH DIFFERENTIAL (CANCER CENTER ONLY)
Abs Immature Granulocytes: 0.01 10*3/uL (ref 0.00–0.07)
Basophils Absolute: 0 10*3/uL (ref 0.0–0.1)
Basophils Relative: 1 %
Eosinophils Absolute: 0 10*3/uL (ref 0.0–0.5)
Eosinophils Relative: 1 %
HCT: 47.4 % (ref 39.0–52.0)
Hemoglobin: 16.2 g/dL (ref 13.0–17.0)
Immature Granulocytes: 0 %
Lymphocytes Relative: 8 %
Lymphs Abs: 0.5 10*3/uL — ABNORMAL LOW (ref 0.7–4.0)
MCH: 32.8 pg (ref 26.0–34.0)
MCHC: 34.2 g/dL (ref 30.0–36.0)
MCV: 96 fL (ref 80.0–100.0)
Monocytes Absolute: 0.5 10*3/uL (ref 0.1–1.0)
Monocytes Relative: 9 %
Neutro Abs: 4.4 10*3/uL (ref 1.7–7.7)
Neutrophils Relative %: 81 %
Platelet Count: 210 10*3/uL (ref 150–400)
RBC: 4.94 MIL/uL (ref 4.22–5.81)
RDW: 11.9 % (ref 11.5–15.5)
WBC Count: 5.5 10*3/uL (ref 4.0–10.5)
nRBC: 0 % (ref 0.0–0.2)

## 2019-03-17 LAB — CMP (CANCER CENTER ONLY)
ALT: 12 U/L (ref 0–44)
AST: 15 U/L (ref 15–41)
Albumin: 3.9 g/dL (ref 3.5–5.0)
Alkaline Phosphatase: 69 U/L (ref 38–126)
Anion gap: 9 (ref 5–15)
BUN: 8 mg/dL (ref 6–20)
CO2: 27 mmol/L (ref 22–32)
Calcium: 9 mg/dL (ref 8.9–10.3)
Chloride: 103 mmol/L (ref 98–111)
Creatinine: 0.69 mg/dL (ref 0.61–1.24)
GFR, Est AFR Am: >60 mL/min (ref >60)
GFR, Estimated: >60 mL/min (ref >60)
Glucose, Bld: 122 mg/dL — ABNORMAL HIGH (ref 70–99)
Potassium: 4.1 mmol/L (ref 3.5–5.1)
Sodium: 139 mmol/L (ref 135–145)
Total Bilirubin: 0.3 mg/dL (ref 0.3–1.2)
Total Protein: 6.7 g/dL (ref 6.5–8.1)

## 2019-03-17 LAB — TSH: TSH: 1.946 u[IU]/mL (ref 0.320–4.118)

## 2019-03-17 MED ORDER — SODIUM CHLORIDE 0.9 % IV SOLN
200.0000 mg | Freq: Once | INTRAVENOUS | Status: AC
  Administered 2019-03-17: 200 mg via INTRAVENOUS
  Filled 2019-03-17: qty 8

## 2019-03-17 MED ORDER — SODIUM CHLORIDE 0.9 % IV SOLN
Freq: Once | INTRAVENOUS | Status: AC
  Administered 2019-03-17: 10:00:00 via INTRAVENOUS
  Filled 2019-03-17: qty 250

## 2019-03-17 MED ORDER — SODIUM CHLORIDE 0.9% FLUSH
10.0000 mL | INTRAVENOUS | Status: DC | PRN
  Filled 2019-03-17: qty 10

## 2019-03-17 MED ORDER — HEPARIN SOD (PORK) LOCK FLUSH 100 UNIT/ML IV SOLN
500.0000 [IU] | Freq: Once | INTRAVENOUS | Status: DC | PRN
  Filled 2019-03-17: qty 5

## 2019-03-17 MED ORDER — SODIUM CHLORIDE 0.9% FLUSH
10.0000 mL | Freq: Once | INTRAVENOUS | Status: AC
  Administered 2019-03-17: 10 mL
  Filled 2019-03-17: qty 10

## 2019-03-17 NOTE — Progress Notes (Signed)
[facility] [phone]   [phone]  OFFICE PROGRESS NOTE  [name], [name], MD [place] [address]  DIAGNOSIS:  1) Stage IIB/IV (T3, N3, M1a) non-small cell lung cancer, squamous cell carcinoma presented with large left hilar mass in addition to mediastinal and left supraclavicular lymphadenopathy as well as contralateral right upper lobe nodule diagnosed in August 2018. PDL 1 expression: 90%. 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 First dose 06/29/2017. Status post 6 cycles. Last dose was given 08/03/2017.  CURRENT THERAPY: Immunotherapy with Ketruda 200 MG IV every 3 weeks, first dose 09/17/2017.  Status post 26 cycles.  INTERVAL HISTORY: Trevor Zavala 53 y.o. male returns to the clinic today for follow-up visit.  The patient is feeling fine today with no concerning complaints.  He denied having any chest pain, shortness of breath, cough or hemoptysis.  He denied having any fever or chills.  He has no nausea, vomiting, diarrhea or constipation.  He denied having any skin rash.  He continues to tolerate his treatment with Keytruda fairly well.  The patient is here today for evaluation before starting cycle #27.  MEDICAL HISTORY: Past Medical History:  Diagnosis Date   Anxiety    COPD (chronic obstructive pulmonary disease) (HCC)    GERD (gastroesophageal reflux disease)    Headache    Hypertension    Pneumonia    Situational depression    Stage III squamous cell carcinoma of left lung ([facility]) 06/19/2017   Lungs & Epiglottis   Tubular adenoma of colon 01/2015   Tubular adenoma of colon 01/2015    ALLERGIES:  is allergic to escitalopram oxalate.  MEDICATIONS:  Current Outpatient Medications  Medication Sig Dispense Refill   acyclovir (ZOVIRAX) 400 MG tablet Take 1 tablet  (400 mg total) by mouth 3 (three) times daily. 21 tablet 3   clonazePAM (KLONOPIN) 0.5 MG tablet TAKE 1 TO 2 TABLETS        (0.5-1MG  TOTAL) TWICE A DAYAS NEEDED FOR ANXIETY 180 tablet 1   diphenhydrAMINE (BENADRYL) 25 MG tablet Take 12.5 mg by mouth every 6 (six) hours as needed for itching.     esomeprazole (NEXIUM) 40 MG capsule 1 po qam 90 capsule 3   fexofenadine-pseudoephedrine (ALLEGRA-D 24) 180-240 MG 24 hr tablet Take 1 tablet by mouth daily.     fluticasone furoate-vilanterol (BREO ELLIPTA) 100-25 MCG/INH AEPB INHALE 1 PUFF INTO THE LUNGS DAILY. 3 each 3   Multiple Vitamin (MULTIVITAMIN WITH MINERALS) TABS tablet Take 1 tablet by mouth daily.     pembrolizumab (KEYTRUDA) 100 MG/4ML SOLN Inject 2 mg/kg into the vein. 200mg  every 3 weeks.     No current facility-administered medications for this visit.     SURGICAL HISTORY:  Past Surgical History:  Procedure Laterality Date   COLONOSCOPY     DIRECT LARYNGOSCOPY N/A 06/25/2017   Procedure: DIRECT LARYNGOSCOPY AND BIOPSY;  Surgeon: Rozetta Nunnery, MD;  Location: [facility];  Service: ENT;  Laterality: N/A;   EAR CYST EXCISION N/A 11/17/2013   Procedure: SEBACEOUS CYST CHEST;  Surgeon: Joyice Faster. Cornett, MD;  Location: [facility];  Service: General;  Laterality: N/A;   IR GASTROSTOMY TUBE MOD SED  08/10/2017   IR GASTROSTOMY TUBE REMOVAL  09/29/2017   KNEE ARTHROSCOPY     LEFT  LIPOMA EXCISION N/A 11/17/2013   Procedure: EXCISION LIPOMA FOREHEAD;  Surgeon: Joyice Faster. Cornett, MD;  Location: [facility];  Service: General;  Laterality: N/A;   LUNG BIOPSY Bilateral 06/12/2017   Procedure: LEFT LUNG BIOPSY;  Surgeon: Grace Isaac, MD;  Location: [facility];  Service: Thoracic;  Laterality: Bilateral;   MICROLARYNGOSCOPY W/VOCAL CORD INJECTION Left 07/16/2018   Procedure: suspened micro laryngoscopy with jet ventilation and prolaryn injection;  Surgeon: Melida Quitter, MD;   Location: [facility];  Service: ENT;  Laterality: Left;   PORTACATH PLACEMENT Right 07/01/2017   Procedure: INSERTION PORT-A-CATH - RIGHT IJ - placed with Fluoro and Ultrasound;  Surgeon: Grace Isaac, MD;  Location: [facility];  Service: Thoracic;  Laterality: Right;   VIDEO BRONCHOSCOPY WITH ENDOBRONCHIAL ULTRASOUND N/A 06/12/2017   Procedure: VIDEO BRONCHOSCOPY WITH ENDOBRONCHIAL ULTRASOUND;  Surgeon: Grace Isaac, MD;  Location: [facility];  Service: Thoracic;  Laterality: N/A;    REVIEW OF SYSTEMS:  A comprehensive review of systems was negative.   PHYSICAL EXAMINATION: General appearance: alert, cooperative and no distress Head: Normocephalic, without obvious abnormality, atraumatic Neck: no adenopathy, no JVD, supple, symmetrical, trachea midline and thyroid not enlarged, symmetric, no tenderness/mass/nodules Lymph nodes: Cervical, supraclavicular, and axillary nodes normal. Resp: clear to auscultation bilaterally Back: symmetric, no curvature. ROM normal. No CVA tenderness. Cardio: regular rate and rhythm, S1, S2 normal, no murmur, click, rub or gallop GI: soft, non-tender; bowel sounds normal; no masses,  no organomegaly Extremities: extremities normal, atraumatic, no cyanosis or edema  ECOG PERFORMANCE STATUS: 1 - Symptomatic but completely ambulatory  Blood pressure 125/82, pulse 87, temperature (!) 97.1 F (36.2 C), temperature source Oral, resp. rate 18, height 5\' 11"  (1.803 m), weight 157 lb 12.8 oz (71.6 kg), SpO2 98 %.  LABORATORY DATA: Lab Results  Component Value Date   WBC 4.4 02/24/2019   HGB 16.3 02/24/2019   HCT 48.8 02/24/2019   MCV 96.1 02/24/2019   PLT 191 02/24/2019      Chemistry      Component Value Date/Time   NA 140 02/24/2019 0729   NA 141 10/29/2017 1049   K 4.3 02/24/2019 0729   K 3.3 (L) 10/29/2017 1049   CL 106 02/24/2019 0729   CO2 24 02/24/2019 0729   CO2 26 10/29/2017 1049   BUN 7 02/24/2019 0729   BUN 11.2 10/29/2017 1049   CREATININE  0.71 02/24/2019 0729   CREATININE 0.7 10/29/2017 1049   GLU 198 08/25/2017      Component Value Date/Time   CALCIUM 8.8 (L) 02/24/2019 0729   CALCIUM 9.0 10/29/2017 1049   ALKPHOS 64 02/24/2019 0729   ALKPHOS 82 10/29/2017 1049   AST 18 02/24/2019 0729   AST 10 10/29/2017 1049   ALT 12 02/24/2019 0729   ALT 8 10/29/2017 1049   BILITOT 0.3 02/24/2019 0729   BILITOT 0.22 10/29/2017 1049       RADIOGRAPHIC STUDIES: Ct Soft Tissue Neck W Contrast  Result Date: 02/22/2019 CLINICAL DATA:  Treated lung and epiglottis cancer. Patient currently on immunotherapy. EXAM: CT NECK WITH CONTRAST TECHNIQUE: Multidetector CT imaging of the neck was performed using the standard protocol following the bolus administration of intravenous contrast. CONTRAST:  183mL OMNIPAQUE IOHEXOL 300 MG/ML  SOLN COMPARISON:  CT of the neck 11/30/2018 FINDINGS: Pharynx and larynx: No residual mucosal or submucosal lesions are present. Post radiation changes are noted again in the oropharynx and hypopharynx. No discrete mass or recurrent lesion is evident. Epiglottis is  within normal limits. Salivary glands: Submandibular and parotid glands are normal bilaterally. Thyroid: Normal Lymph nodes: No significant residual recurrent adenopathy is present. Vascular: Atherosclerotic calcifications are again noted at the carotid bifurcations bilaterally. There is no significant stenosis or change. A right IJ Port-A-Cath is in place. Limited intracranial: Unremarkable Visualized orbits: Globes and orbits are within normal limits. Mastoids and visualized paranasal sinuses: Left mastoid surgery is again noted. A polyp or mucous retention cyst is present along the floor of the left maxillary sinus. The paranasal sinuses and mastoid air cells are otherwise clear. The globes and orbits are within normal limits. Skeleton: Vertebral body heights alignment are maintained. No focal lytic or blastic lesions are present. Upper chest: Post radiation  changes are again noted in the perihilar regions bilaterally. IMPRESSION: 1. No evidence for residual recurrent disease within the neck. 2. Post radiation changes noted. Electronically Signed   By: San Morelle M.D.   On: 02/22/2019 08:43   Ct Chest W Contrast  Result Date: 02/22/2019 CLINICAL DATA:  Patient with history of lung and epiglottis cancer. On immunotherapy. Follow-up exam. EXAM: CT CHEST WITH CONTRAST TECHNIQUE: Multidetector CT imaging of the chest was performed during intravenous contrast administration. CONTRAST:  174mL OMNIPAQUE IOHEXOL 300 MG/ML  SOLN COMPARISON:  Chest CT 11/30/2018 FINDINGS: Cardiovascular: Right anterior chest wall Port-A-Cath is present with tip terminating in the superior vena cava. Normal heart size. Trace fluid superior pericardial recess. Thoracic aortic vascular calcifications. Mediastinum/Nodes: No enlarged axillary, mediastinal or hilar lymphadenopathy. Normal appearance of the esophagus. Lungs/Pleura: Similar-appearing postradiation changes within the paramediastinal left lung. Soft tissue density about the central left upper lobe bronchovascular structures (image 54; series 4) is similar when compared to recent prior exams. Emphysematous change. Upper Abdomen: No acute process. Musculoskeletal: Thoracic spine changes. No aggressive or acute appearing osseous lesions. IMPRESSION: No evidence to suggest recurrence. Similar-appearing soft tissue within central bronchovascular structures favored to represent postradiation changes, recommend attention to this region on follow-up. Aortic Atherosclerosis (ICD10-I70.0). Electronically Signed   By: Lovey Newcomer M.D.   On: 02/22/2019 12:05    ASSESSMENT AND PLAN: This is a very pleasant 53 years old white male with recently diagnosed stage IIIB/IV non-small cell lung cancer, adenocarcinoma presented with large left hilar mass in addition to mediastinal and left supraclavicular lymphadenopathy as well as suspicious  right upper lobe pulmonary nodule diagnosed in August 2018. 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 He underwent a course of concurrent chemoradiation to the lung as well as the epiglottic area under the care of Dr. Tammi Klippel. He is status post 6 cycle. He tolerated this course of treatment well except for the radiation induced esophagitis as well as weight loss and fatigue. The patient had partial response to the previous treatment. He is currently on treatment with Keytruda 200 mg IV every 3 weeks status post 26 cycles. The patient has been tolerating this treatment well with no concerning adverse effects. I recommended for him to proceed with cycle #27 today as scheduled.Marland Kitchen He will come back for follow-up visit in 3 weeks for evaluation before the next cycle of his treatment. The patient was advised to call immediately if he has any concerning symptoms in the interval. The patient voices understanding of current disease status and treatment options and is in agreement with the current care plan. All questions were answered. The patient knows to call the clinic with any problems, questions or concerns. We can certainly see the patient much  sooner if necessary. Disclaimer: This note was dictated with voice recognition software. Similar sounding words can inadvertently be transcribed and may not be corrected upon review.

## 2019-03-17 NOTE — Patient Instructions (Signed)
[facility] Discharge Instructions for Patients Receiving Chemotherapy  Today you received the following chemotherapy agents:  Keytruda.  To help prevent nausea and vomiting after your treatment, we encourage you to take your nausea medication as directed.   If you develop nausea and vomiting that is not controlled by your nausea medication, call the clinic.   BELOW ARE SYMPTOMS THAT SHOULD BE REPORTED IMMEDIATELY:  *FEVER GREATER THAN 100.5 F  *CHILLS WITH OR WITHOUT FEVER  NAUSEA AND VOMITING THAT IS NOT CONTROLLED WITH YOUR NAUSEA MEDICATION  *UNUSUAL SHORTNESS OF BREATH  *UNUSUAL BRUISING OR BLEEDING  TENDERNESS IN MOUTH AND THROAT WITH OR WITHOUT PRESENCE OF ULCERS  *URINARY PROBLEMS  *BOWEL PROBLEMS  UNUSUAL RASH Items with * indicate a potential emergency and should be followed up as soon as possible.  Feel free to call the clinic should you have any questions or concerns. The clinic phone number is [phone].  Please show the CHEMO ALERT CARD at check-in to the Emergency Department and triage nurse.    

## 2019-03-18 ENCOUNTER — Telehealth: Payer: Self-pay | Admitting: Internal Medicine

## 2019-03-18 NOTE — Telephone Encounter (Signed)
Scheduled appt per 5/07 los - pt to get an updated schedule next visit.

## 2019-04-06 ENCOUNTER — Telehealth: Payer: Self-pay | Admitting: *Deleted

## 2019-04-06 NOTE — Telephone Encounter (Signed)
Received call from pt's wife. She states pt has appts here tomorrow with lab, Dr. Julien Nordmann and then his immunotherapy. They have returned from [place] today after having been there for 4-5 days with family.  She wanted to make sure it was still ok for pt to come tomorrow given current Covid 19  Concerns. Spoke with Dr. Julien Nordmann. And he is ok for pt to come tomorrow. Wife informed of this. No further questions or concerns.

## 2019-04-07 ENCOUNTER — Inpatient Hospital Stay: Payer: 59

## 2019-04-07 ENCOUNTER — Encounter: Payer: Self-pay | Admitting: Internal Medicine

## 2019-04-07 ENCOUNTER — Inpatient Hospital Stay (HOSPITAL_BASED_OUTPATIENT_CLINIC_OR_DEPARTMENT_OTHER): Payer: 59 | Admitting: Internal Medicine

## 2019-04-07 ENCOUNTER — Other Ambulatory Visit: Payer: Self-pay

## 2019-04-07 VITALS — BP 106/82 | HR 100 | Temp 99.1°F | Resp 18 | Ht 71.0 in | Wt 156.1 lb

## 2019-04-07 DIAGNOSIS — C321 Malignant neoplasm of supraglottis: Secondary | ICD-10-CM

## 2019-04-07 DIAGNOSIS — F419 Anxiety disorder, unspecified: Secondary | ICD-10-CM

## 2019-04-07 DIAGNOSIS — Z5112 Encounter for antineoplastic immunotherapy: Secondary | ICD-10-CM

## 2019-04-07 DIAGNOSIS — R5382 Chronic fatigue, unspecified: Secondary | ICD-10-CM

## 2019-04-07 DIAGNOSIS — Z95828 Presence of other vascular implants and grafts: Secondary | ICD-10-CM

## 2019-04-07 DIAGNOSIS — Z79899 Other long term (current) drug therapy: Secondary | ICD-10-CM

## 2019-04-07 DIAGNOSIS — C3412 Malignant neoplasm of upper lobe, left bronchus or lung: Secondary | ICD-10-CM

## 2019-04-07 DIAGNOSIS — I10 Essential (primary) hypertension: Secondary | ICD-10-CM

## 2019-04-07 DIAGNOSIS — J449 Chronic obstructive pulmonary disease, unspecified: Secondary | ICD-10-CM

## 2019-04-07 LAB — CBC WITH DIFFERENTIAL (CANCER CENTER ONLY)
Abs Immature Granulocytes: 0.03 10*3/uL (ref 0.00–0.07)
Basophils Absolute: 0 10*3/uL (ref 0.0–0.1)
Basophils Relative: 0 %
Eosinophils Absolute: 0 10*3/uL (ref 0.0–0.5)
Eosinophils Relative: 1 %
HCT: 48 % (ref 39.0–52.0)
Hemoglobin: 16.1 g/dL (ref 13.0–17.0)
Immature Granulocytes: 0 %
Lymphocytes Relative: 5 %
Lymphs Abs: 0.4 10*3/uL — ABNORMAL LOW (ref 0.7–4.0)
MCH: 32.9 pg (ref 26.0–34.0)
MCHC: 33.5 g/dL (ref 30.0–36.0)
MCV: 98.2 fL (ref 80.0–100.0)
Monocytes Absolute: 0.6 10*3/uL (ref 0.1–1.0)
Monocytes Relative: 8 %
Neutro Abs: 5.8 10*3/uL (ref 1.7–7.7)
Neutrophils Relative %: 86 %
Platelet Count: 220 10*3/uL (ref 150–400)
RBC: 4.89 MIL/uL (ref 4.22–5.81)
RDW: 12.3 % (ref 11.5–15.5)
WBC Count: 6.9 10*3/uL (ref 4.0–10.5)
nRBC: 0 % (ref 0.0–0.2)

## 2019-04-07 LAB — CMP (CANCER CENTER ONLY)
ALT: 21 U/L (ref 0–44)
AST: 23 U/L (ref 15–41)
Albumin: 4.1 g/dL (ref 3.5–5.0)
Alkaline Phosphatase: 60 U/L (ref 38–126)
Anion gap: 9 (ref 5–15)
BUN: 10 mg/dL (ref 6–20)
CO2: 27 mmol/L (ref 22–32)
Calcium: 9.1 mg/dL (ref 8.9–10.3)
Chloride: 101 mmol/L (ref 98–111)
Creatinine: 0.72 mg/dL (ref 0.61–1.24)
GFR, Est AFR Am: >60 mL/min (ref >60)
GFR, Estimated: >60 mL/min (ref >60)
Glucose, Bld: 133 mg/dL — ABNORMAL HIGH (ref 70–99)
Potassium: 4.4 mmol/L (ref 3.5–5.1)
Sodium: 137 mmol/L (ref 135–145)
Total Bilirubin: 0.6 mg/dL (ref 0.3–1.2)
Total Protein: 6.8 g/dL (ref 6.5–8.1)

## 2019-04-07 LAB — TSH: TSH: 2.59 u[IU]/mL (ref 0.320–4.118)

## 2019-04-07 MED ORDER — SODIUM CHLORIDE 0.9 % IV SOLN
Freq: Once | INTRAVENOUS | Status: AC
  Administered 2019-04-07: 11:00:00 via INTRAVENOUS
  Filled 2019-04-07: qty 250

## 2019-04-07 MED ORDER — SODIUM CHLORIDE 0.9% FLUSH
10.0000 mL | Freq: Once | INTRAVENOUS | Status: AC
  Administered 2019-04-07: 10 mL
  Filled 2019-04-07: qty 10

## 2019-04-07 MED ORDER — HEPARIN SOD (PORK) LOCK FLUSH 100 UNIT/ML IV SOLN
500.0000 [IU] | Freq: Once | INTRAVENOUS | Status: AC | PRN
  Administered 2019-04-07: 500 [IU]
  Filled 2019-04-07: qty 5

## 2019-04-07 MED ORDER — SODIUM CHLORIDE 0.9 % IV SOLN
200.0000 mg | Freq: Once | INTRAVENOUS | Status: AC
  Administered 2019-04-07: 12:00:00 200 mg via INTRAVENOUS
  Filled 2019-04-07: qty 8

## 2019-04-07 MED ORDER — SODIUM CHLORIDE 0.9% FLUSH
10.0000 mL | INTRAVENOUS | Status: DC | PRN
  Administered 2019-04-07: 13:00:00 10 mL
  Filled 2019-04-07: qty 10

## 2019-04-07 NOTE — Patient Instructions (Signed)
[facility] Discharge Instructions for Patients Receiving Chemotherapy  Today you received the following Immunotherapy: Keytruda  To help prevent nausea and vomiting after your treatment, we encourage you to take your nausea medication as directed by your MD.   If you develop nausea and vomiting that is not controlled by your nausea medication, call the clinic.   BELOW ARE SYMPTOMS THAT SHOULD BE REPORTED IMMEDIATELY:  *FEVER GREATER THAN 100.5 F  *CHILLS WITH OR WITHOUT FEVER  NAUSEA AND VOMITING THAT IS NOT CONTROLLED WITH YOUR NAUSEA MEDICATION  *UNUSUAL SHORTNESS OF BREATH  *UNUSUAL BRUISING OR BLEEDING  TENDERNESS IN MOUTH AND THROAT WITH OR WITHOUT PRESENCE OF ULCERS  *URINARY PROBLEMS  *BOWEL PROBLEMS  UNUSUAL RASH Items with * indicate a potential emergency and should be followed up as soon as possible.  Feel free to call the clinic should you have any questions or concerns. The clinic phone number is [phone].  Please show the [facility] at check-in to the Emergency Department and triage nurse.  Coronavirus (COVID-19) Are you at risk?  Are you at risk for the Coronavirus (COVID-19)?  To be considered HIGH RISK for Coronavirus (COVID-19), you have to meet the following criteria:  . Traveled to [place], [place], [place], [place] or [place]; or in the [place] to [place], [place], [place], or [place]; and have fever, cough, and shortness of breath within the last 2 weeks of travel OR . Been in close contact with a person diagnosed with COVID-19 within the last 2 weeks and have fever, cough, and shortness of breath . IF YOU DO NOT MEET THESE CRITERIA, YOU ARE CONSIDERED LOW RISK FOR COVID-19.  What to do if you are HIGH RISK for COVID-19?  Marland Kitchen If you are having a medical emergency, call 911. . Seek medical care right away. Before you go to a doctor's office, urgent care or emergency department, call ahead and tell them about your  recent travel, contact with someone diagnosed with COVID-19, and your symptoms. You should receive instructions from your physician's office regarding next steps of care.  . When you arrive at healthcare provider, tell the healthcare staff immediately you have returned from visiting [place], [place], [place], [place] or [place]; or traveled in the [place] to [place], [place], [place], or [place]; in the last two weeks or you have been in close contact with a person diagnosed with COVID-19 in the last 2 weeks.   . Tell the health care staff about your symptoms: fever, cough and shortness of breath. . After you have been seen by a medical provider, you will be either: o Tested for (COVID-19) and discharged home on quarantine except to seek medical care if symptoms worsen, and asked to  - Stay home and avoid contact with others until you get your results (4-5 days)  - Avoid travel on public transportation if possible (such as bus, train, or airplane) or o Sent to the Emergency Department by EMS for evaluation, COVID-19 testing, and possible admission depending on your condition and test results.  What to do if you are LOW RISK for COVID-19?  Reduce your risk of any infection by using the same precautions used for avoiding the common cold or flu:  Marland Kitchen Wash your hands often with soap and warm water for at least 20 seconds.  If soap and water are not readily available, use an alcohol-based hand sanitizer with at least 60% alcohol.  . If coughing  or sneezing, cover your mouth and nose by coughing or sneezing into the elbow areas of your shirt or coat, into a tissue or into your sleeve (not your hands). . Avoid shaking hands with others and consider head nods or verbal greetings only. . Avoid touching your eyes, nose, or mouth with unwashed hands.  . Avoid close contact with people who are sick. . Avoid places or events with large numbers of people in one location, like concerts or sporting  events. . Carefully consider travel plans you have or are making. . If you are planning any travel outside or inside the [place], visit the CDC's Travelers' Health webpage for the latest health notices. . If you have some symptoms but not all symptoms, continue to monitor at home and seek medical attention if your symptoms worsen. . If you are having a medical emergency, call 911.   [facility] / e-Visit: [email or web address]         [facility]: [facility]: [phone]                   [facility]: [phone]

## 2019-04-07 NOTE — Progress Notes (Signed)
[facility] [phone]   [phone]  OFFICE PROGRESS NOTE  [name], [name], MD [place] [address]  DIAGNOSIS:  1) Stage IIB/IV (T3, N3, M1a) non-small cell lung cancer, squamous cell carcinoma presented with large left hilar mass in addition to mediastinal and left supraclavicular lymphadenopathy as well as contralateral right upper lobe nodule diagnosed in August 2018. PDL 1 expression: 90%. 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 First dose 06/29/2017. Status post 6 cycles. Last dose was given 08/03/2017.  CURRENT THERAPY: Immunotherapy with Ketruda 200 MG IV every 3 weeks, first dose 09/17/2017.  Status post 27 cycles.  INTERVAL HISTORY: Trevor Zavala 53 y.o. male returns to the clinic today for follow-up visit.  The patient is feeling fine today with no concerning complaints.  He spent the last 2 weeks at [facility] and enjoyed his time there.  He denied having any chest pain, shortness of breath, cough or hemoptysis.  He denied having any fever or chills.  He has no nausea, vomiting, diarrhea or constipation.  He continues to tolerate his treatment with Keytruda fairly well.  He is here today for evaluation before starting cycle #28.  MEDICAL HISTORY: Past Medical History:  Diagnosis Date  . Anxiety   . COPD (chronic obstructive pulmonary disease) ([place])   . GERD (gastroesophageal reflux disease)   . Headache   . Hypertension   . Pneumonia   . Situational depression   . Stage III squamous cell carcinoma of left lung ([place]) 06/19/2017   Lungs & Epiglottis  . Tubular adenoma of colon 01/2015  . Tubular adenoma of colon 01/2015    ALLERGIES:  is allergic to escitalopram oxalate.  MEDICATIONS:  Current Outpatient Medications  Medication Sig Dispense Refill  . acyclovir (ZOVIRAX)  400 MG tablet Take 1 tablet (400 mg total) by mouth 3 (three) times daily. 21 tablet 3  . clonazePAM (KLONOPIN) 0.5 MG tablet TAKE 1 TO 2 TABLETS        (0.5-1MG  TOTAL) TWICE A DAYAS NEEDED FOR ANXIETY 180 tablet 1  . diphenhydrAMINE (BENADRYL) 25 MG tablet Take 12.5 mg by mouth every 6 (six) hours as needed for itching.    . esomeprazole (NEXIUM) 40 MG capsule 1 po qam 90 capsule 3  . fexofenadine-pseudoephedrine (ALLEGRA-D 24) 180-240 MG 24 hr tablet Take 1 tablet by mouth daily.    . fluticasone furoate-vilanterol (BREO ELLIPTA) 100-25 MCG/INH AEPB INHALE 1 PUFF INTO THE LUNGS DAILY. 3 each 3  . Multiple Vitamin (MULTIVITAMIN WITH MINERALS) TABS tablet Take 1 tablet by mouth daily.    . pembrolizumab (KEYTRUDA) 100 MG/4ML SOLN Inject 2 mg/kg into the vein. 200mg  every 3 weeks.     No current facility-administered medications for this visit.     SURGICAL HISTORY:  Past Surgical History:  Procedure Laterality Date  . COLONOSCOPY    . DIRECT LARYNGOSCOPY N/A 06/25/2017   Procedure: DIRECT LARYNGOSCOPY AND BIOPSY;  Surgeon: Rozetta Nunnery, MD;  Location: [facility];  Service: ENT;  Laterality: N/A;  . EAR CYST EXCISION N/A 11/17/2013   Procedure: SEBACEOUS CYST CHEST;  Surgeon: Joyice Faster. Cornett, MD;  Location: [facility];  Service: General;  Laterality: N/A;  . IR GASTROSTOMY TUBE MOD SED  08/10/2017  . IR GASTROSTOMY TUBE REMOVAL  09/29/2017  . KNEE ARTHROSCOPY  LEFT  . LIPOMA EXCISION N/A 11/17/2013   Procedure: EXCISION LIPOMA FOREHEAD;  Surgeon: Joyice Faster. Cornett, MD;  Location: [facility];  Service: General;  Laterality: N/A;  . LUNG BIOPSY Bilateral 06/12/2017   Procedure: LEFT LUNG BIOPSY;  Surgeon: Grace Isaac, MD;  Location: [place];  Service: Thoracic;  Laterality: Bilateral;  . MICROLARYNGOSCOPY W/VOCAL CORD INJECTION Left 07/16/2018   Procedure: suspened micro laryngoscopy with jet ventilation and prolaryn injection;  Surgeon:  Melida Quitter, MD;  Location: [facility];  Service: ENT;  Laterality: Left;  . PORTACATH PLACEMENT Right 07/01/2017   Procedure: INSERTION PORT-A-CATH - RIGHT IJ - placed with Fluoro and Ultrasound;  Surgeon: Grace Isaac, MD;  Location: [facility];  Service: Thoracic;  Laterality: Right;  Marland Kitchen VIDEO BRONCHOSCOPY WITH ENDOBRONCHIAL ULTRASOUND N/A 06/12/2017   Procedure: VIDEO BRONCHOSCOPY WITH ENDOBRONCHIAL ULTRASOUND;  Surgeon: Grace Isaac, MD;  Location: [facility];  Service: Thoracic;  Laterality: N/A;    REVIEW OF SYSTEMS:  A comprehensive review of systems was negative.   PHYSICAL EXAMINATION: General appearance: alert, cooperative and no distress Head: Normocephalic, without obvious abnormality, atraumatic Neck: no adenopathy, no JVD, supple, symmetrical, trachea midline and thyroid not enlarged, symmetric, no tenderness/mass/nodules Lymph nodes: Cervical, supraclavicular, and axillary nodes normal. Resp: clear to auscultation bilaterally Back: symmetric, no curvature. ROM normal. No CVA tenderness. Cardio: regular rate and rhythm, S1, S2 normal, no murmur, click, rub or gallop GI: soft, non-tender; bowel sounds normal; no masses,  no organomegaly Extremities: extremities normal, atraumatic, no cyanosis or edema  ECOG PERFORMANCE STATUS: 0 - Asymptomatic  Blood pressure 106/82, pulse 100, temperature 99.1 F (37.3 C), temperature source Oral, resp. rate 18, height 5\' 11"  (1.803 m), weight 156 lb 1.6 oz (70.8 kg), SpO2 94 %.  LABORATORY DATA: Lab Results  Component Value Date   WBC 5.5 03/17/2019   HGB 16.2 03/17/2019   HCT 47.4 03/17/2019   MCV 96.0 03/17/2019   PLT 210 03/17/2019      Chemistry      Component Value Date/Time   NA 139 03/17/2019 0827   NA 141 10/29/2017 1049   K 4.1 03/17/2019 0827   K 3.3 (L) 10/29/2017 1049   CL 103 03/17/2019 0827   CO2 27 03/17/2019 0827   CO2 26 10/29/2017 1049   BUN 8 03/17/2019 0827   BUN 11.2 10/29/2017 1049   CREATININE 0.69  03/17/2019 0827   CREATININE 0.7 10/29/2017 1049   GLU 198 08/25/2017      Component Value Date/Time   CALCIUM 9.0 03/17/2019 0827   CALCIUM 9.0 10/29/2017 1049   ALKPHOS 69 03/17/2019 0827   ALKPHOS 82 10/29/2017 1049   AST 15 03/17/2019 0827   AST 10 10/29/2017 1049   ALT 12 03/17/2019 0827   ALT 8 10/29/2017 1049   BILITOT 0.3 03/17/2019 0827   BILITOT 0.22 10/29/2017 1049       RADIOGRAPHIC STUDIES: No results found.  ASSESSMENT AND PLAN: This is a very pleasant 53 years old white male with recently diagnosed stage IIIB/IV non-small cell lung cancer, adenocarcinoma presented with large left hilar mass in addition to mediastinal and left supraclavicular lymphadenopathy as well as suspicious right upper lobe pulmonary nodule diagnosed in August 2018. 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 He underwent a course of concurrent chemoradiation to the lung as well as the epiglottic area under the care of Dr. Tammi Klippel. He is status post 6 cycle. He tolerated this  course of treatment well except for the radiation induced esophagitis as well as weight loss and fatigue. The patient had partial response to the previous treatment. He is currently on treatment with Keytruda 200 mg IV every 3 weeks status post 27 cycles. The patient has been tolerating this treatment well. I recommended for him to proceed with cycle #28 today as planned. I will see him back for follow-up visit in 3 weeks for evaluation before the next cycle of his treatment. He was advised to call if he has any concerning symptoms in the interval. The patient voices understanding of current disease status and treatment options and is in agreement with the current care plan. All questions were answered. The patient knows to call the clinic with any problems, questions or concerns. We can certainly see the patient much sooner if necessary. Disclaimer: This note was dictated with  voice recognition software. Similar sounding words can inadvertently be transcribed and may not be corrected upon review.

## 2019-04-28 ENCOUNTER — Inpatient Hospital Stay: Payer: 59

## 2019-04-28 ENCOUNTER — Other Ambulatory Visit: Payer: Self-pay

## 2019-04-28 ENCOUNTER — Inpatient Hospital Stay: Payer: 59 | Attending: Internal Medicine

## 2019-04-28 ENCOUNTER — Telehealth: Payer: Self-pay | Admitting: Physician Assistant

## 2019-04-28 ENCOUNTER — Inpatient Hospital Stay (HOSPITAL_BASED_OUTPATIENT_CLINIC_OR_DEPARTMENT_OTHER): Payer: 59 | Admitting: Physician Assistant

## 2019-04-28 VITALS — BP 138/78 | HR 61 | Temp 97.7°F | Resp 18 | Ht 71.0 in | Wt 159.3 lb

## 2019-04-28 DIAGNOSIS — I10 Essential (primary) hypertension: Secondary | ICD-10-CM | POA: Diagnosis not present

## 2019-04-28 DIAGNOSIS — J449 Chronic obstructive pulmonary disease, unspecified: Secondary | ICD-10-CM | POA: Insufficient documentation

## 2019-04-28 DIAGNOSIS — C321 Malignant neoplasm of supraglottis: Secondary | ICD-10-CM

## 2019-04-28 DIAGNOSIS — C3412 Malignant neoplasm of upper lobe, left bronchus or lung: Secondary | ICD-10-CM | POA: Insufficient documentation

## 2019-04-28 DIAGNOSIS — R5382 Chronic fatigue, unspecified: Secondary | ICD-10-CM

## 2019-04-28 DIAGNOSIS — Z7951 Long term (current) use of inhaled steroids: Secondary | ICD-10-CM

## 2019-04-28 DIAGNOSIS — Z79899 Other long term (current) drug therapy: Secondary | ICD-10-CM

## 2019-04-28 DIAGNOSIS — Z95828 Presence of other vascular implants and grafts: Secondary | ICD-10-CM

## 2019-04-28 DIAGNOSIS — Z5112 Encounter for antineoplastic immunotherapy: Secondary | ICD-10-CM | POA: Diagnosis present

## 2019-04-28 DIAGNOSIS — F419 Anxiety disorder, unspecified: Secondary | ICD-10-CM | POA: Diagnosis not present

## 2019-04-28 LAB — CMP (CANCER CENTER ONLY)
ALT: 11 U/L (ref 0–44)
AST: 18 U/L (ref 15–41)
Albumin: 3.8 g/dL (ref 3.5–5.0)
Alkaline Phosphatase: 61 U/L (ref 38–126)
Anion gap: 9 (ref 5–15)
BUN: 7 mg/dL (ref 6–20)
CO2: 26 mmol/L (ref 22–32)
Calcium: 8.3 mg/dL — ABNORMAL LOW (ref 8.9–10.3)
Chloride: 106 mmol/L (ref 98–111)
Creatinine: 0.69 mg/dL (ref 0.61–1.24)
GFR, Est AFR Am: >60 mL/min (ref >60)
GFR, Estimated: >60 mL/min (ref >60)
Glucose, Bld: 106 mg/dL — ABNORMAL HIGH (ref 70–99)
Potassium: 4.1 mmol/L (ref 3.5–5.1)
Sodium: 141 mmol/L (ref 135–145)
Total Bilirubin: 0.5 mg/dL (ref 0.3–1.2)
Total Protein: 6.6 g/dL (ref 6.5–8.1)

## 2019-04-28 LAB — CBC WITH DIFFERENTIAL (CANCER CENTER ONLY)
Abs Immature Granulocytes: 0.02 10*3/uL (ref 0.00–0.07)
Basophils Absolute: 0 10*3/uL (ref 0.0–0.1)
Basophils Relative: 0 %
Eosinophils Absolute: 0 10*3/uL (ref 0.0–0.5)
Eosinophils Relative: 1 %
HCT: 47 % (ref 39.0–52.0)
Hemoglobin: 15.7 g/dL (ref 13.0–17.0)
Immature Granulocytes: 0 %
Lymphocytes Relative: 9 %
Lymphs Abs: 0.6 10*3/uL — ABNORMAL LOW (ref 0.7–4.0)
MCH: 33 pg (ref 26.0–34.0)
MCHC: 33.4 g/dL (ref 30.0–36.0)
MCV: 98.7 fL (ref 80.0–100.0)
Monocytes Absolute: 0.6 10*3/uL (ref 0.1–1.0)
Monocytes Relative: 10 %
Neutro Abs: 4.8 10*3/uL (ref 1.7–7.7)
Neutrophils Relative %: 80 %
Platelet Count: 212 10*3/uL (ref 150–400)
RBC: 4.76 MIL/uL (ref 4.22–5.81)
RDW: 12.2 % (ref 11.5–15.5)
WBC Count: 6 10*3/uL (ref 4.0–10.5)
nRBC: 0 % (ref 0.0–0.2)

## 2019-04-28 LAB — TSH: TSH: 2.444 u[IU]/mL (ref 0.320–4.118)

## 2019-04-28 MED ORDER — SODIUM CHLORIDE 0.9% FLUSH
10.0000 mL | INTRAVENOUS | Status: DC | PRN
  Administered 2019-04-28: 10 mL
  Filled 2019-04-28: qty 10

## 2019-04-28 MED ORDER — SODIUM CHLORIDE 0.9 % IV SOLN
Freq: Once | INTRAVENOUS | Status: AC
  Administered 2019-04-28: 15:00:00 via INTRAVENOUS
  Filled 2019-04-28: qty 250

## 2019-04-28 MED ORDER — HEPARIN SOD (PORK) LOCK FLUSH 100 UNIT/ML IV SOLN
500.0000 [IU] | Freq: Once | INTRAVENOUS | Status: AC | PRN
  Administered 2019-04-28: 15:00:00 500 [IU]
  Filled 2019-04-28: qty 5

## 2019-04-28 MED ORDER — SODIUM CHLORIDE 0.9 % IV SOLN
200.0000 mg | Freq: Once | INTRAVENOUS | Status: AC
  Administered 2019-04-28: 15:00:00 200 mg via INTRAVENOUS
  Filled 2019-04-28: qty 8

## 2019-04-28 MED ORDER — SODIUM CHLORIDE 0.9% FLUSH
10.0000 mL | Freq: Once | INTRAVENOUS | Status: AC
  Administered 2019-04-28: 13:00:00 10 mL
  Filled 2019-04-28: qty 10

## 2019-04-28 NOTE — Patient Instructions (Signed)
[facility] Discharge Instructions for Patients Receiving Chemotherapy  Today you received the following chemotherapy agents:  Keytruda.  To help prevent nausea and vomiting after your treatment, we encourage you to take your nausea medication as directed.   If you develop nausea and vomiting that is not controlled by your nausea medication, call the clinic.   BELOW ARE SYMPTOMS THAT SHOULD BE REPORTED IMMEDIATELY:  *FEVER GREATER THAN 100.5 F  *CHILLS WITH OR WITHOUT FEVER  NAUSEA AND VOMITING THAT IS NOT CONTROLLED WITH YOUR NAUSEA MEDICATION  *UNUSUAL SHORTNESS OF BREATH  *UNUSUAL BRUISING OR BLEEDING  TENDERNESS IN MOUTH AND THROAT WITH OR WITHOUT PRESENCE OF ULCERS  *URINARY PROBLEMS  *BOWEL PROBLEMS  UNUSUAL RASH Items with * indicate a potential emergency and should be followed up as soon as possible.  Feel free to call the clinic should you have any questions or concerns. The clinic phone number is [phone].  Please show the CHEMO ALERT CARD at check-in to the Emergency Department and triage nurse.    

## 2019-04-28 NOTE — Telephone Encounter (Signed)
Per 6/18 los appts already scheduled.

## 2019-04-28 NOTE — Progress Notes (Signed)
[place] OFFICE PROGRESS NOTE  [name], [name], MD [place] [address]  DIAGNOSIS:  1) Stage IIB/IV (T3, N3, M1a) non-small cell lung cancer, squamous cell carcinoma presented with large left hilar mass in addition to mediastinal and left supraclavicular lymphadenopathy as well as contralateral right upper lobe nodule diagnosed in August 2018. PDL 1 expression: 90%. 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 First dose 06/29/2017. Status post 6 cycles. Last dose was given 08/03/2017.  CURRENT THERAPY:  Immunotherapy with Ketruda 200 MG IV every 3 weeks, first dose 09/17/2017.  Status post 28 cycles.  INTERVAL HISTORY: Trevor Zavala 53 y.o. male returns to the clinic for a follow-up visit.  The patient is feeling well today without any concerning complaints.  He has been tolerating his treatment well without any adverse effects.  He denies any fever, chills, night sweats, or weight loss.  He denies any dysphasia, odynophagia, hematemesis, or lymphadenopathy.  He denies any chest pain, shortness of breath, cough, or hemoptysis.  He denies any nausea, vomiting, diarrhea, or constipation.  He denies any rashes or skin changes.  He denies any headaches or visual changes.  He is here today for evaluation prior to starting cycle #29.   MEDICAL HISTORY: Past Medical History:  Diagnosis Date  . Anxiety   . COPD (chronic obstructive pulmonary disease) ([place])   . GERD (gastroesophageal reflux disease)   . Headache   . Hypertension   . Pneumonia   . Situational depression   . Stage III squamous cell carcinoma of left lung ([place]) 06/19/2017   Lungs & Epiglottis  . Tubular adenoma of colon 01/2015  . Tubular adenoma of colon 01/2015    ALLERGIES:  is allergic to escitalopram oxalate.  MEDICATIONS:  Current Outpatient Medications   Medication Sig Dispense Refill  . acyclovir (ZOVIRAX) 400 MG tablet Take 1 tablet (400 mg total) by mouth 3 (three) times daily. 21 tablet 3  . clonazePAM (KLONOPIN) 0.5 MG tablet TAKE 1 TO 2 TABLETS        (0.5-1MG  TOTAL) TWICE A DAYAS NEEDED FOR ANXIETY 180 tablet 1  . diphenhydrAMINE (BENADRYL) 25 MG tablet Take 12.5 mg by mouth every 6 (six) hours as needed for itching.    . esomeprazole (NEXIUM) 40 MG capsule 1 po qam 90 capsule 3  . fexofenadine-pseudoephedrine (ALLEGRA-D 24) 180-240 MG 24 hr tablet Take 1 tablet by mouth daily.    . fluticasone furoate-vilanterol (BREO ELLIPTA) 100-25 MCG/INH AEPB INHALE 1 PUFF INTO THE LUNGS DAILY. 3 each 3  . Multiple Vitamin (MULTIVITAMIN WITH MINERALS) TABS tablet Take 1 tablet by mouth daily.    . pembrolizumab (KEYTRUDA) 100 MG/4ML SOLN Inject 2 mg/kg into the vein. 200mg  every 3 weeks.     No current facility-administered medications for this visit.     SURGICAL HISTORY:  Past Surgical History:  Procedure Laterality Date  . COLONOSCOPY    . DIRECT LARYNGOSCOPY N/A 06/25/2017   Procedure: DIRECT LARYNGOSCOPY AND BIOPSY;  Surgeon: Rozetta Nunnery, MD;  Location: [facility];  Service: ENT;  Laterality: N/A;  . EAR CYST EXCISION N/A 11/17/2013   Procedure: SEBACEOUS CYST CHEST;  Surgeon: Joyice Faster. Cornett, MD;  Location: [facility];  Service: General;  Laterality: N/A;  . IR GASTROSTOMY TUBE MOD SED  08/10/2017  . IR GASTROSTOMY TUBE REMOVAL  09/29/2017  . KNEE ARTHROSCOPY     LEFT  . LIPOMA EXCISION N/A 11/17/2013   Procedure: EXCISION LIPOMA FOREHEAD;  Surgeon: Joyice Faster. Cornett, MD;  Location: [place] [facility];  Service: General;  Laterality: N/A;  . LUNG BIOPSY Bilateral 06/12/2017   Procedure: LEFT LUNG BIOPSY;  Surgeon: Grace Isaac, MD;  Location: [place];  Service: Thoracic;  Laterality: Bilateral;  . MICROLARYNGOSCOPY W/VOCAL CORD INJECTION Left 07/16/2018   Procedure: suspened micro  laryngoscopy with jet ventilation and prolaryn injection;  Surgeon: Melida Quitter, MD;  Location: [facility];  Service: ENT;  Laterality: Left;  . PORTACATH PLACEMENT Right 07/01/2017   Procedure: INSERTION PORT-A-CATH - RIGHT IJ - placed with Fluoro and Ultrasound;  Surgeon: Grace Isaac, MD;  Location: [facility];  Service: Thoracic;  Laterality: Right;  Marland Kitchen VIDEO BRONCHOSCOPY WITH ENDOBRONCHIAL ULTRASOUND N/A 06/12/2017   Procedure: VIDEO BRONCHOSCOPY WITH ENDOBRONCHIAL ULTRASOUND;  Surgeon: Grace Isaac, MD;  Location: [facility];  Service: Thoracic;  Laterality: N/A;    REVIEW OF SYSTEMS:   Review of Systems  Constitutional: Negative for appetite change, chills, fatigue, fever and unexpected weight change.  HENT:   Negative for mouth sores, nosebleeds, sore throat and trouble swallowing.   Eyes: Negative for eye problems and icterus.  Respiratory: Negative for cough, hemoptysis, shortness of breath and wheezing.   Cardiovascular: Negative for chest pain and leg swelling.  Gastrointestinal: Negative for abdominal pain, constipation, diarrhea, nausea and vomiting.  Genitourinary: Negative for bladder incontinence, difficulty urinating, dysuria, frequency and hematuria.   Musculoskeletal: Negative for back pain, gait problem, neck pain and neck stiffness.  Skin: Negative for itching and rash.  Neurological: Negative for dizziness, extremity weakness, gait problem, headaches, light-headedness and seizures.  Hematological: Negative for adenopathy. Does not bruise/bleed easily.  Psychiatric/Behavioral: Negative for confusion, depression and sleep disturbance. The patient is not nervous/anxious.     PHYSICAL EXAMINATION:  Blood pressure 138/78, pulse 61, temperature 97.7 F (36.5 C), temperature source Oral, resp. rate 18, height 5\' 11"  (1.803 m), weight 159 lb 4.8 oz (72.3 kg), SpO2 99 %.  ECOG PERFORMANCE STATUS: 1 - Symptomatic but completely ambulatory  Physical Exam  Constitutional: Oriented  to person, place, and time and well-developed, well-nourished, and in no distress.  HENT:  Head: Normocephalic and atraumatic.  Mouth/Throat: Oropharynx is clear and moist. No oropharyngeal exudate.  Eyes: Conjunctivae are normal. Right eye exhibits no discharge. Left eye exhibits no discharge. No scleral icterus.  Neck: Normal range of motion. Neck supple.  Cardiovascular: Normal rate, regular rhythm, normal heart sounds and intact distal pulses.   Pulmonary/Chest: Effort normal and breath sounds normal. No respiratory distress. No wheezes. No rales.  Abdominal: Soft. Bowel sounds are normal. Exhibits no distension and no mass. There is no tenderness.  Musculoskeletal: Normal range of motion. Exhibits no edema.  Lymphadenopathy:    No cervical adenopathy.  Neurological: Alert and oriented to person, place, and time. Exhibits normal muscle tone. Gait normal. Coordination normal.  Skin: Skin is warm and dry. No rash noted. Not diaphoretic. No erythema. No pallor.  Psychiatric: Mood, memory and judgment normal.  Vitals reviewed.  LABORATORY DATA: Lab Results  Component Value Date   WBC 6.0 04/28/2019   HGB 15.7 04/28/2019   HCT 47.0 04/28/2019   MCV 98.7 04/28/2019   PLT 212 04/28/2019      Chemistry      Component Value Date/Time   NA 141 04/28/2019 1251   NA 141 10/29/2017 1049  K 4.1 04/28/2019 1251   K 3.3 (L) 10/29/2017 1049   CL 106 04/28/2019 1251   CO2 26 04/28/2019 1251   CO2 26 10/29/2017 1049   BUN 7 04/28/2019 1251   BUN 11.2 10/29/2017 1049   CREATININE 0.69 04/28/2019 1251   CREATININE 0.7 10/29/2017 1049   GLU 198 08/25/2017      Component Value Date/Time   CALCIUM 8.3 (L) 04/28/2019 1251   CALCIUM 9.0 10/29/2017 1049   ALKPHOS 61 04/28/2019 1251   ALKPHOS 82 10/29/2017 1049   AST 18 04/28/2019 1251   AST 10 10/29/2017 1049   ALT 11 04/28/2019 1251   ALT 8 10/29/2017 1049   BILITOT 0.5 04/28/2019 1251   BILITOT 0.22 10/29/2017 1049        RADIOGRAPHIC STUDIES:  No results found.   ASSESSMENT/PLAN:  This is a very pleasant 53 year old Caucasian male diagnosed with stage III/IV non-small cell lung cancer, squamous cell carcinoma.  He presented with a left hilar mass in addition to mediastinal and left supraclavicular lymphadenopathy and right hilar lymph adenopathy.  He was diagnosed in August 2018.  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  He previously underwent concurrent chemoradiation to the lung as well as at the epiglottic area under the care of Dr. Tammi Klippel.  He is status post 6 cycles.  He tolerated it well except for radiation-induced esophagitis as well as weight loss and fatigue.  He had a partial response to treatment.  He is currently undergoing immunotherapy with Keytruda 200 mg IV every 3 weeks.  He is status post 28 cycles.  He has been tolerating treatment well without any adverse effects.  The patient was seen with Dr. Julien Nordmann today.  Labs were reviewed with the patient. We recommend he proceed with cycle #29 today as scheduled.  I will arrange for the patient to have a restaging CT scan of the chest and of the neck prior to his next visit.  I will see him back for follow-up visit in 3 weeks for evaluation and to review his scan before starting cycle #30. The patient was advised to call immediately if he has any concerning symptoms in the interval. The patient voices understanding of current disease status and treatment options and is in agreement with the current care plan. All questions were answered. The patient knows to call the clinic with any problems, questions or concerns. We can certainly see the patient much sooner if necessary   Orders Placed This Encounter  Procedures  . CT Chest W Contrast    Standing Status:   Future    Standing Expiration Date:   04/27/2020    Order Specific Question:   ** REASON FOR EXAM (FREE TEXT)    Answer:   Restaging  squamous cell carcinoma    Order Specific Question:   If indicated for the ordered procedure, I authorize the administration of contrast media per Radiology protocol    Answer:   Yes    Order Specific Question:   Preferred imaging location?    Answer:   [facility]    Order Specific Question:   Radiology Contrast Protocol - do NOT remove file path    Answer:   [email or web address]  . CT Soft Tissue Neck W Contrast    Standing Status:   Future    Standing Expiration Date:   04/27/2020    Order Specific Question:   ** REASON FOR EXAM (FREE TEXT)    Answer:  restaging squamous cell carcinoma    Order Specific Question:   If indicated for the ordered procedure, I authorize the administration of contrast media per Radiology protocol    Answer:   Yes    Order Specific Question:   Preferred imaging location?    Answer:   [facility]    Order Specific Question:   Radiology Contrast Protocol - do NOT remove file path    Answer:   [email or web address]     Cassandra L Heilingoetter, PA-C 04/28/19  ADDENDUM: Hematology/Oncology Attending: I had a face-to-face encounter with the patient.  I recommended his care plan.  This is a very pleasant 53 years old white male with a stage IV cancer, squamous cell carcinoma. The patient is currently undergoing treatment with immunotherapy with Keytruda 200 mg IV every 3 weeks status post 28 cycles.  The patient has been tolerating this treatment well with no concerning adverse effects. I recommended for him to proceed with cycle #29 today as planned. We will see him back for follow-up in 3 weeks for evaluation with repeat CT scan of the neck and chest for restaging of his disease. The patient was advised if he has any concerning symptoms in the interval.  Disclaimer: This note was dictated with voice recognition software. Similar sounding words can inadvertently be transcribed and may be missed  upon review. Eilleen Kempf, MD 04/29/19

## 2019-04-29 ENCOUNTER — Encounter: Payer: Self-pay | Admitting: Physician Assistant

## 2019-05-17 ENCOUNTER — Ambulatory Visit (HOSPITAL_COMMUNITY)
Admission: RE | Admit: 2019-05-17 | Discharge: 2019-05-17 | Disposition: A | Discharge status: Home / Self Care | Payer: 59 | Source: Ambulatory Visit | Attending: Physician Assistant | Admitting: Physician Assistant

## 2019-05-17 ENCOUNTER — Other Ambulatory Visit: Payer: Self-pay

## 2019-05-17 ENCOUNTER — Ambulatory Visit (HOSPITAL_COMMUNITY): Payer: 59

## 2019-05-17 ENCOUNTER — Encounter (HOSPITAL_COMMUNITY): Payer: Self-pay

## 2019-05-17 DIAGNOSIS — C321 Malignant neoplasm of supraglottis: Secondary | ICD-10-CM | POA: Diagnosis not present

## 2019-05-17 MED ORDER — IOHEXOL 300 MG/ML  SOLN
75.0000 mL | Freq: Once | INTRAMUSCULAR | Status: AC | PRN
  Administered 2019-05-17: 75 mL via INTRAVENOUS

## 2019-05-17 MED ORDER — SODIUM CHLORIDE (PF) 0.9 % IJ SOLN
INTRAMUSCULAR | Status: AC
  Filled 2019-05-17: qty 50

## 2019-05-19 ENCOUNTER — Inpatient Hospital Stay: Payer: 59 | Attending: Internal Medicine

## 2019-05-19 ENCOUNTER — Inpatient Hospital Stay: Payer: 59

## 2019-05-19 ENCOUNTER — Inpatient Hospital Stay (HOSPITAL_BASED_OUTPATIENT_CLINIC_OR_DEPARTMENT_OTHER): Payer: 59 | Admitting: Internal Medicine

## 2019-05-19 ENCOUNTER — Encounter: Payer: Self-pay | Admitting: Internal Medicine

## 2019-05-19 ENCOUNTER — Other Ambulatory Visit: Payer: Self-pay

## 2019-05-19 VITALS — BP 135/91 | HR 71 | Temp 97.9°F | Resp 18 | Ht 71.0 in | Wt 154.0 lb

## 2019-05-19 DIAGNOSIS — Z79899 Other long term (current) drug therapy: Secondary | ICD-10-CM

## 2019-05-19 DIAGNOSIS — Z95828 Presence of other vascular implants and grafts: Secondary | ICD-10-CM

## 2019-05-19 DIAGNOSIS — J449 Chronic obstructive pulmonary disease, unspecified: Secondary | ICD-10-CM | POA: Insufficient documentation

## 2019-05-19 DIAGNOSIS — C321 Malignant neoplasm of supraglottis: Secondary | ICD-10-CM

## 2019-05-19 DIAGNOSIS — Z5112 Encounter for antineoplastic immunotherapy: Secondary | ICD-10-CM

## 2019-05-19 DIAGNOSIS — Z7951 Long term (current) use of inhaled steroids: Secondary | ICD-10-CM | POA: Insufficient documentation

## 2019-05-19 DIAGNOSIS — I10 Essential (primary) hypertension: Secondary | ICD-10-CM | POA: Insufficient documentation

## 2019-05-19 DIAGNOSIS — C3412 Malignant neoplasm of upper lobe, left bronchus or lung: Secondary | ICD-10-CM | POA: Diagnosis present

## 2019-05-19 DIAGNOSIS — F419 Anxiety disorder, unspecified: Secondary | ICD-10-CM | POA: Insufficient documentation

## 2019-05-19 DIAGNOSIS — R5382 Chronic fatigue, unspecified: Secondary | ICD-10-CM

## 2019-05-19 LAB — CMP (CANCER CENTER ONLY)
ALT: 14 U/L (ref 0–44)
AST: 19 U/L (ref 15–41)
Albumin: 3.8 g/dL (ref 3.5–5.0)
Alkaline Phosphatase: 74 U/L (ref 38–126)
Anion gap: 8 (ref 5–15)
BUN: 6 mg/dL (ref 6–20)
CO2: 27 mmol/L (ref 22–32)
Calcium: 8.8 mg/dL — ABNORMAL LOW (ref 8.9–10.3)
Chloride: 105 mmol/L (ref 98–111)
Creatinine: 0.7 mg/dL (ref 0.61–1.24)
GFR, Est AFR Am: >60 mL/min (ref >60)
GFR, Estimated: >60 mL/min (ref >60)
Glucose, Bld: 108 mg/dL — ABNORMAL HIGH (ref 70–99)
Potassium: 4 mmol/L (ref 3.5–5.1)
Sodium: 140 mmol/L (ref 135–145)
Total Bilirubin: 0.3 mg/dL (ref 0.3–1.2)
Total Protein: 6.5 g/dL (ref 6.5–8.1)

## 2019-05-19 LAB — CBC WITH DIFFERENTIAL (CANCER CENTER ONLY)
Abs Immature Granulocytes: 0.02 10*3/uL (ref 0.00–0.07)
Basophils Absolute: 0 10*3/uL (ref 0.0–0.1)
Basophils Relative: 0 %
Eosinophils Absolute: 0 10*3/uL (ref 0.0–0.5)
Eosinophils Relative: 1 %
HCT: 47.1 % (ref 39.0–52.0)
Hemoglobin: 16 g/dL (ref 13.0–17.0)
Immature Granulocytes: 0 %
Lymphocytes Relative: 10 %
Lymphs Abs: 0.5 10*3/uL — ABNORMAL LOW (ref 0.7–4.0)
MCH: 32.9 pg (ref 26.0–34.0)
MCHC: 34 g/dL (ref 30.0–36.0)
MCV: 96.7 fL (ref 80.0–100.0)
Monocytes Absolute: 0.6 10*3/uL (ref 0.1–1.0)
Monocytes Relative: 10 %
Neutro Abs: 4.3 10*3/uL (ref 1.7–7.7)
Neutrophils Relative %: 79 %
Platelet Count: 198 10*3/uL (ref 150–400)
RBC: 4.87 MIL/uL (ref 4.22–5.81)
RDW: 11.8 % (ref 11.5–15.5)
WBC Count: 5.5 10*3/uL (ref 4.0–10.5)
nRBC: 0 % (ref 0.0–0.2)

## 2019-05-19 LAB — TSH: TSH: 3.417 u[IU]/mL (ref 0.320–4.118)

## 2019-05-19 MED ORDER — SODIUM CHLORIDE 0.9% FLUSH
10.0000 mL | Freq: Once | INTRAVENOUS | Status: AC
  Administered 2019-05-19: 10 mL
  Filled 2019-05-19: qty 10

## 2019-05-19 MED ORDER — SODIUM CHLORIDE 0.9% FLUSH
10.0000 mL | INTRAVENOUS | Status: DC | PRN
  Administered 2019-05-19: 15:00:00 10 mL
  Filled 2019-05-19: qty 10

## 2019-05-19 MED ORDER — SODIUM CHLORIDE 0.9 % IV SOLN
200.0000 mg | Freq: Once | INTRAVENOUS | Status: AC
  Administered 2019-05-19: 14:00:00 200 mg via INTRAVENOUS
  Filled 2019-05-19: qty 8

## 2019-05-19 MED ORDER — SODIUM CHLORIDE 0.9 % IV SOLN
Freq: Once | INTRAVENOUS | Status: AC
  Administered 2019-05-19: 13:00:00 via INTRAVENOUS
  Filled 2019-05-19: qty 250

## 2019-05-19 MED ORDER — HEPARIN SOD (PORK) LOCK FLUSH 100 UNIT/ML IV SOLN
500.0000 [IU] | Freq: Once | INTRAVENOUS | Status: AC | PRN
  Administered 2019-05-19: 500 [IU]
  Filled 2019-05-19: qty 5

## 2019-05-19 NOTE — Patient Instructions (Signed)
[facility] Discharge Instructions for Patients Receiving Chemotherapy  Today you received the following chemotherapy agents:  Keytruda.  To help prevent nausea and vomiting after your treatment, we encourage you to take your nausea medication as directed.   If you develop nausea and vomiting that is not controlled by your nausea medication, call the clinic.   BELOW ARE SYMPTOMS THAT SHOULD BE REPORTED IMMEDIATELY:  *FEVER GREATER THAN 100.5 F  *CHILLS WITH OR WITHOUT FEVER  NAUSEA AND VOMITING THAT IS NOT CONTROLLED WITH YOUR NAUSEA MEDICATION  *UNUSUAL SHORTNESS OF BREATH  *UNUSUAL BRUISING OR BLEEDING  TENDERNESS IN MOUTH AND THROAT WITH OR WITHOUT PRESENCE OF ULCERS  *URINARY PROBLEMS  *BOWEL PROBLEMS  UNUSUAL RASH Items with * indicate a potential emergency and should be followed up as soon as possible.  Feel free to call the clinic should you have any questions or concerns. The clinic phone number is [phone].  Please show the CHEMO ALERT CARD at check-in to the Emergency Department and triage nurse.    

## 2019-05-19 NOTE — Progress Notes (Signed)
[facility] [phone]   [phone]  OFFICE PROGRESS NOTE  Zavala, Trevor Lacks, MD [place] [address]  DIAGNOSIS:  1) Stage IIB/IV (T3, N3, M1a) non-small cell lung cancer, squamous cell carcinoma presented with large left hilar mass in addition to mediastinal and left supraclavicular lymphadenopathy as well as contralateral right upper lobe nodule diagnosed in August 2018. PDL 1 expression: 90%. 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 First dose 06/29/2017. Status post 6 cycles. Last dose was given 08/03/2017.  CURRENT THERAPY: Immunotherapy with Ketruda 200 MG IV every 3 weeks, first dose 09/17/2017.  Status post 29 cycles.  INTERVAL HISTORY: Trevor Zavala 53 y.o. male returns to the clinic today for follow-up visit.  The patient is feeling fine today with no concerning complaints.  He denied having any current chest pain, shortness breath, cough or hemoptysis.  He denied having any fever or chills.  He has no nausea, vomiting, diarrhea or constipation.  He has no significant weight loss or night sweats.  He has no headache or visual changes.  He continues to tolerate his treatment with Keytruda fairly well.  The patient had repeat CT scan of the neck and the chest performed recently and he is here for evaluation and discussion of his current results and treatment options.  MEDICAL HISTORY: Past Medical History:  Diagnosis Date  . Anxiety   . COPD (chronic obstructive pulmonary disease) ([facility])   . GERD (gastroesophageal reflux disease)   . Headache   . Hypertension   . Pneumonia   . Situational depression   . Stage III squamous cell carcinoma of left lung ([facility]) 06/19/2017   Lungs & Epiglottis  . Tubular adenoma of colon 01/2015  . Tubular adenoma of colon 01/2015    ALLERGIES:  is allergic  to escitalopram oxalate.  MEDICATIONS:  Current Outpatient Medications  Medication Sig Dispense Refill  . acyclovir (ZOVIRAX) 400 MG tablet Take 1 tablet (400 mg total) by mouth 3 (three) times daily. 21 tablet 3  . clonazePAM (KLONOPIN) 0.5 MG tablet TAKE 1 TO 2 TABLETS        (0.5-1MG  TOTAL) TWICE A DAYAS NEEDED FOR ANXIETY 180 tablet 1  . diphenhydrAMINE (BENADRYL) 25 MG tablet Take 12.5 mg by mouth every 6 (six) hours as needed for itching.    . esomeprazole (NEXIUM) 40 MG capsule 1 po qam 90 capsule 3  . fexofenadine-pseudoephedrine (ALLEGRA-D 24) 180-240 MG 24 hr tablet Take 1 tablet by mouth daily.    . fluticasone furoate-vilanterol (BREO ELLIPTA) 100-25 MCG/INH AEPB INHALE 1 PUFF INTO THE LUNGS DAILY. 3 each 3  . Multiple Vitamin (MULTIVITAMIN WITH MINERALS) TABS tablet Take 1 tablet by mouth daily.    . pembrolizumab (KEYTRUDA) 100 MG/4ML SOLN Inject 2 mg/kg into the vein. 200mg  every 3 weeks.     No current facility-administered medications for this visit.     SURGICAL HISTORY:  Past Surgical History:  Procedure Laterality Date  . COLONOSCOPY    . DIRECT LARYNGOSCOPY N/A 06/25/2017   Procedure: DIRECT LARYNGOSCOPY AND BIOPSY;  Surgeon: Rozetta Nunnery, MD;  Location: [facility];  Service: ENT;  Laterality: N/A;  . EAR CYST EXCISION N/A 11/17/2013   Procedure: SEBACEOUS CYST CHEST;  Surgeon: Joyice Faster. Cornett, MD;  Location: [facility];  Service: General;  Laterality:  N/A;  . IR GASTROSTOMY TUBE MOD SED  08/10/2017  . IR GASTROSTOMY TUBE REMOVAL  09/29/2017  . KNEE ARTHROSCOPY     LEFT  . LIPOMA EXCISION N/A 11/17/2013   Procedure: EXCISION LIPOMA FOREHEAD;  Surgeon: Joyice Faster. Cornett, MD;  Location: [facility];  Service: General;  Laterality: N/A;  . LUNG BIOPSY Bilateral 06/12/2017   Procedure: LEFT LUNG BIOPSY;  Surgeon: Grace Isaac, MD;  Location: [facility];  Service: Thoracic;  Laterality: Bilateral;  . MICROLARYNGOSCOPY  W/VOCAL CORD INJECTION Left 07/16/2018   Procedure: suspened micro laryngoscopy with jet ventilation and prolaryn injection;  Surgeon: Melida Quitter, MD;  Location: [facility];  Service: ENT;  Laterality: Left;  . PORTACATH PLACEMENT Right 07/01/2017   Procedure: INSERTION PORT-A-CATH - RIGHT IJ - placed with Fluoro and Ultrasound;  Surgeon: Grace Isaac, MD;  Location: [facility];  Service: Thoracic;  Laterality: Right;  Marland Kitchen VIDEO BRONCHOSCOPY WITH ENDOBRONCHIAL ULTRASOUND N/A 06/12/2017   Procedure: VIDEO BRONCHOSCOPY WITH ENDOBRONCHIAL ULTRASOUND;  Surgeon: Grace Isaac, MD;  Location: [facility];  Service: Thoracic;  Laterality: N/A;    REVIEW OF SYSTEMS:  Constitutional: negative Eyes: negative Ears, nose, mouth, throat, and face: negative Respiratory: negative Cardiovascular: negative Gastrointestinal: negative Genitourinary:negative Integument/breast: negative Hematologic/lymphatic: negative Musculoskeletal:negative Neurological: negative Behavioral/Psych: negative Endocrine: negative Allergic/Immunologic: negative   PHYSICAL EXAMINATION: General appearance: alert, cooperative and no distress Head: Normocephalic, without obvious abnormality, atraumatic Neck: no adenopathy, no JVD, supple, symmetrical, trachea midline and thyroid not enlarged, symmetric, no tenderness/mass/nodules Lymph nodes: Cervical, supraclavicular, and axillary nodes normal. Resp: clear to auscultation bilaterally Back: symmetric, no curvature. ROM normal. No CVA tenderness. Cardio: regular rate and rhythm, S1, S2 normal, no murmur, click, rub or gallop GI: soft, non-tender; bowel sounds normal; no masses,  no organomegaly Extremities: extremities normal, atraumatic, no cyanosis or edema Neurologic: Alert and oriented X 3, normal strength and tone. Normal symmetric reflexes. Normal coordination and gait  ECOG PERFORMANCE STATUS: 0 - Asymptomatic  Blood pressure (!) 135/91, pulse 71, temperature 97.9 F (36.6 C),  temperature source Oral, resp. rate 18, height 5\' 11"  (1.803 m), weight 154 lb (69.9 kg), SpO2 97 %.  LABORATORY DATA: Lab Results  Component Value Date   WBC 6.0 04/28/2019   HGB 15.7 04/28/2019   HCT 47.0 04/28/2019   MCV 98.7 04/28/2019   PLT 212 04/28/2019      Chemistry      Component Value Date/Time   NA 141 04/28/2019 1251   NA 141 10/29/2017 1049   K 4.1 04/28/2019 1251   K 3.3 (L) 10/29/2017 1049   CL 106 04/28/2019 1251   CO2 26 04/28/2019 1251   CO2 26 10/29/2017 1049   BUN 7 04/28/2019 1251   BUN 11.2 10/29/2017 1049   CREATININE 0.69 04/28/2019 1251   CREATININE 0.7 10/29/2017 1049   GLU 198 08/25/2017      Component Value Date/Time   CALCIUM 8.3 (L) 04/28/2019 1251   CALCIUM 9.0 10/29/2017 1049   ALKPHOS 61 04/28/2019 1251   ALKPHOS 82 10/29/2017 1049   AST 18 04/28/2019 1251   AST 10 10/29/2017 1049   ALT 11 04/28/2019 1251   ALT 8 10/29/2017 1049   BILITOT 0.5 04/28/2019 1251   BILITOT 0.22 10/29/2017 1049       RADIOGRAPHIC STUDIES: Ct Soft Tissue Neck W Contrast  Result Date: 05/17/2019 CLINICAL DATA:  53 year old male with treated lung and epiglottis cancer in 2018. Currently on immunotherapy. Restaging. EXAM: CT NECK WITH  CONTRAST TECHNIQUE: Multidetector CT imaging of the neck was performed using the standard protocol following the bolus administration of intravenous contrast. CONTRAST:  53mL OMNIPAQUE IOHEXOL 300 MG/ML  SOLN COMPARISON:  CT Chest today reported separately. Neck CTs 02/22/2019 and earlier. FINDINGS: Pharynx and larynx: Stable mild asymmetry of the vocal folds. Possible previous vocal cord injections. Stable sequelae of XRT with mild generalized pharyngeal mucosal space and epiglottic thickening. No abnormal enhancement or discrete mass. Negative parapharyngeal and retropharyngeal spaces. Salivary glands: Negative sublingual space. Stable submandibular glands and submandibular spaces. Parotid glands and spaces appears stable and within  normal limits. Thyroid: Diminutive, negative. Lymph nodes: Stable small lymph nodes. No cervical lymphadenopathy. Stable cervical carotid atherosclerosis. Stable right IJ approach porta cath. The major vascular structures in the neck and at the skull base are patent. The left vertebral artery is dominant. Vascular: Negative. Limited intracranial: Visualized orbits: Negative. Mastoids and visualized paranasal sinuses: Previous left mastoidectomy. Small chronic left maxillary alveolar recess mucous retention cyst. Visualized paranasal sinuses and mastoids are stable and well pneumatized. Skeleton: Stable, negative. Upper chest: Reported separately today. IMPRESSION: 1. Satisfactory post treatment appearance of the neck. NI-RADS category 1. 2. CT Chest today reported separately. Electronically Signed   By: Genevie Ann M.D.   On: 05/17/2019 08:28   Ct Chest W Contrast  Result Date: 05/17/2019 CLINICAL DATA:  History of lung and epiglottis cancer, post chemo and radiation therapy. Patient is currently undergoing immunotherapy. EXAM: CT CHEST WITH CONTRAST TECHNIQUE: Multidetector CT imaging of the chest was performed during intravenous contrast administration. CONTRAST:  66mL OMNIPAQUE IOHEXOL 300 MG/ML  SOLN COMPARISON:  02/22/2019 FINDINGS: Cardiovascular: Stable position of injectable Port-A-Cath. Normal heart size. No pericardial effusion. Mild calcific atherosclerotic disease of the coronary arteries. Mediastinum/Nodes: No enlarged mediastinal, hilar, or axillary lymph nodes. Thyroid gland, trachea, and esophagus demonstrate no significant findings. Lungs/Pleura: Similar appearing post radiation changes within the paramediastinal left lung. Similar appearance of soft tissue density about left bronchus intermedius and the central left bronchovascular structures, image 25-35/73, series 3. New 9 mm ground-glass nodule in the left upper lobe, image 42/166, series 6. Stable left upper lobe posterior pleural thickening.  Upper Abdomen: No acute abnormality. Musculoskeletal: No suspicious osseous lesions. IMPRESSION: 1. Stable appearance of soft tissue density along the left bronchus intermedius and central left bronchovascular structures, favored to represent post treatment changes. 2. New 9 mm ground-glass nodule in the left upper lobe, suspicious for recurrence. Electronically Signed   By: Fidela Salisbury M.D.   On: 05/17/2019 08:33    ASSESSMENT AND PLAN: This is a very pleasant 53 years old white male with recently diagnosed stage IIIB/IV non-small cell lung cancer, adenocarcinoma presented with large left hilar mass in addition to mediastinal and left supraclavicular lymphadenopathy as well as suspicious right upper lobe pulmonary nodule diagnosed in August 2018. 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 He underwent a course of concurrent chemoradiation to the lung as well as the epiglottic area under the care of Dr. Tammi Klippel. He is status post 6 cycle. He tolerated this course of treatment well except for the radiation induced esophagitis as well as weight loss and fatigue. The patient had partial response to the previous treatment. He is currently on treatment with Keytruda 200 mg IV every 3 weeks status post 29 cycles. The patient continues to tolerate this treatment well with no concerning adverse effects. He had a repeat CT scan of the neck and chest performed recently.  I personally and independently reviewed the scan images and discussed the result and showed the images to the patient today. His scan showed a stable disease except for new left upper lobe pulmonary nodule with groundglass appearance.  This could be metastatic disease but inflammatory nodule cannot be completely excluded. I recommended for the patient to continue his current treatment with [facility] and he will proceed with cycle #30 today.  I will continue to monitor this new pulmonary nodule  closely on the upcoming imaging studies. He will come back for follow-up visit in 3 weeks for reevaluation before the next cycle of his treatment. For the hypertension, he will continue on his current blood pressure medication and monitor it closely at home. He was advised to call immediately if he has any concerning symptoms in the interval. The patient voices understanding of current disease status and treatment options and is in agreement with the current care plan. All questions were answered. The patient knows to call the clinic with any problems, questions or concerns. We can certainly see the patient much sooner if necessary. Disclaimer: This note was dictated with voice recognition software. Similar sounding words can inadvertently be transcribed and may not be corrected upon review.

## 2019-05-26 ENCOUNTER — Ambulatory Visit: Payer: 59 | Admitting: Internal Medicine

## 2019-05-31 ENCOUNTER — Telehealth: Payer: Self-pay | Admitting: Internal Medicine

## 2019-05-31 NOTE — Telephone Encounter (Signed)
R/s appt per MM request - blocked schedule on 7/30 - unable to reach pt . Left message with new apt date and time

## 2019-06-09 ENCOUNTER — Inpatient Hospital Stay: Payer: 59 | Admitting: Internal Medicine

## 2019-06-09 ENCOUNTER — Inpatient Hospital Stay (HOSPITAL_BASED_OUTPATIENT_CLINIC_OR_DEPARTMENT_OTHER): Payer: 59 | Admitting: Physician Assistant

## 2019-06-09 ENCOUNTER — Inpatient Hospital Stay: Payer: 59

## 2019-06-09 ENCOUNTER — Other Ambulatory Visit: Payer: Self-pay

## 2019-06-09 VITALS — BP 120/81 | HR 76 | Temp 98.9°F | Resp 18 | Ht 71.0 in | Wt 154.7 lb

## 2019-06-09 DIAGNOSIS — C3412 Malignant neoplasm of upper lobe, left bronchus or lung: Secondary | ICD-10-CM | POA: Diagnosis not present

## 2019-06-09 DIAGNOSIS — C321 Malignant neoplasm of supraglottis: Secondary | ICD-10-CM

## 2019-06-09 DIAGNOSIS — Z95828 Presence of other vascular implants and grafts: Secondary | ICD-10-CM

## 2019-06-09 DIAGNOSIS — I10 Essential (primary) hypertension: Secondary | ICD-10-CM | POA: Diagnosis not present

## 2019-06-09 DIAGNOSIS — Z79899 Other long term (current) drug therapy: Secondary | ICD-10-CM

## 2019-06-09 DIAGNOSIS — F419 Anxiety disorder, unspecified: Secondary | ICD-10-CM

## 2019-06-09 DIAGNOSIS — J449 Chronic obstructive pulmonary disease, unspecified: Secondary | ICD-10-CM

## 2019-06-09 DIAGNOSIS — R5382 Chronic fatigue, unspecified: Secondary | ICD-10-CM

## 2019-06-09 DIAGNOSIS — Z5112 Encounter for antineoplastic immunotherapy: Secondary | ICD-10-CM

## 2019-06-09 DIAGNOSIS — Z7951 Long term (current) use of inhaled steroids: Secondary | ICD-10-CM

## 2019-06-09 LAB — TSH: TSH: 2.01 u[IU]/mL (ref 0.320–4.118)

## 2019-06-09 LAB — CMP (CANCER CENTER ONLY)
ALT: 14 U/L (ref 0–44)
AST: 16 U/L (ref 15–41)
Albumin: 3.6 g/dL (ref 3.5–5.0)
Alkaline Phosphatase: 67 U/L (ref 38–126)
Anion gap: 7 (ref 5–15)
BUN: <4 mg/dL — ABNORMAL LOW (ref 6–20)
CO2: 26 mmol/L (ref 22–32)
Calcium: 8.9 mg/dL (ref 8.9–10.3)
Chloride: 107 mmol/L (ref 98–111)
Creatinine: 0.68 mg/dL (ref 0.61–1.24)
GFR, Est AFR Am: >60 mL/min (ref >60)
GFR, Estimated: >60 mL/min (ref >60)
Glucose, Bld: 73 mg/dL (ref 70–99)
Potassium: 4.5 mmol/L (ref 3.5–5.1)
Sodium: 140 mmol/L (ref 135–145)
Total Bilirubin: 0.3 mg/dL (ref 0.3–1.2)
Total Protein: 6.2 g/dL — ABNORMAL LOW (ref 6.5–8.1)

## 2019-06-09 LAB — CBC WITH DIFFERENTIAL (CANCER CENTER ONLY)
Abs Immature Granulocytes: 0.01 10*3/uL (ref 0.00–0.07)
Basophils Absolute: 0 10*3/uL (ref 0.0–0.1)
Basophils Relative: 1 %
Eosinophils Absolute: 0 10*3/uL (ref 0.0–0.5)
Eosinophils Relative: 1 %
HCT: 47.1 % (ref 39.0–52.0)
Hemoglobin: 16.2 g/dL (ref 13.0–17.0)
Immature Granulocytes: 0 %
Lymphocytes Relative: 10 %
Lymphs Abs: 0.3 10*3/uL — ABNORMAL LOW (ref 0.7–4.0)
MCH: 33.1 pg (ref 26.0–34.0)
MCHC: 34.4 g/dL (ref 30.0–36.0)
MCV: 96.1 fL (ref 80.0–100.0)
Monocytes Absolute: 0.3 10*3/uL (ref 0.1–1.0)
Monocytes Relative: 10 %
Neutro Abs: 2.6 10*3/uL (ref 1.7–7.7)
Neutrophils Relative %: 78 %
Platelet Count: 196 10*3/uL (ref 150–400)
RBC: 4.9 MIL/uL (ref 4.22–5.81)
RDW: 11.7 % (ref 11.5–15.5)
WBC Count: 3.3 10*3/uL — ABNORMAL LOW (ref 4.0–10.5)
nRBC: 0 % (ref 0.0–0.2)

## 2019-06-09 MED ORDER — SODIUM CHLORIDE 0.9% FLUSH
10.0000 mL | INTRAVENOUS | Status: DC | PRN
  Administered 2019-06-09: 10 mL
  Filled 2019-06-09: qty 10

## 2019-06-09 MED ORDER — SODIUM CHLORIDE 0.9 % IV SOLN
Freq: Once | INTRAVENOUS | Status: AC
  Administered 2019-06-09: 10:00:00 via INTRAVENOUS
  Filled 2019-06-09: qty 250

## 2019-06-09 MED ORDER — HEPARIN SOD (PORK) LOCK FLUSH 100 UNIT/ML IV SOLN
500.0000 [IU] | Freq: Once | INTRAVENOUS | Status: AC | PRN
  Administered 2019-06-09: 500 [IU]
  Filled 2019-06-09: qty 5

## 2019-06-09 MED ORDER — SODIUM CHLORIDE 0.9 % IV SOLN
200.0000 mg | Freq: Once | INTRAVENOUS | Status: AC
  Administered 2019-06-09: 200 mg via INTRAVENOUS
  Filled 2019-06-09: qty 8

## 2019-06-09 MED ORDER — SODIUM CHLORIDE 0.9% FLUSH
10.0000 mL | Freq: Once | INTRAVENOUS | Status: AC
  Administered 2019-06-09: 09:00:00 10 mL
  Filled 2019-06-09: qty 10

## 2019-06-09 NOTE — Patient Instructions (Signed)
[facility] Discharge Instructions for Patients Receiving Chemotherapy  Today you received the following chemotherapy agents:  Keytruda.  To help prevent nausea and vomiting after your treatment, we encourage you to take your nausea medication as directed.   If you develop nausea and vomiting that is not controlled by your nausea medication, call the clinic.   BELOW ARE SYMPTOMS THAT SHOULD BE REPORTED IMMEDIATELY:  *FEVER GREATER THAN 100.5 F  *CHILLS WITH OR WITHOUT FEVER  NAUSEA AND VOMITING THAT IS NOT CONTROLLED WITH YOUR NAUSEA MEDICATION  *UNUSUAL SHORTNESS OF BREATH  *UNUSUAL BRUISING OR BLEEDING  TENDERNESS IN MOUTH AND THROAT WITH OR WITHOUT PRESENCE OF ULCERS  *URINARY PROBLEMS  *BOWEL PROBLEMS  UNUSUAL RASH Items with * indicate a potential emergency and should be followed up as soon as possible.  Feel free to call the clinic should you have any questions or concerns. The clinic phone number is [phone].  Please show the CHEMO ALERT CARD at check-in to the Emergency Department and triage nurse.    

## 2019-06-09 NOTE — Progress Notes (Signed)
[facility] OFFICE PROGRESS NOTE  [name], [name], MD [place] [address]  DIAGNOSIS:  1) Stage IIB/IV (T3, N3, M1a) non-small cell lung cancer, squamous cell carcinoma presented with large left hilar mass in addition to mediastinal and left supraclavicular lymphadenopathy as well as contralateral right upper lobe nodule diagnosed in August 2018. PDL 1 expression: 90%. 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 First dose 06/29/2017. Status post 6 cycles. Last dose was given 08/03/2017.  CURRENT THERAPY: Immunotherapy with Ketruda 200 MG IV every 3 weeks, first dose 09/17/2017. Status post 30 cycles.  INTERVAL HISTORY: DILRAJ KILLGORE 53 y.o. male returns to the clinic for a follow-up visit.  The patient is feeling well today without any concerning complaints.  He has been tolerating treatment with immunotherapy well without any adverse effects.  He denies any fever, chills, night sweats, or weight loss.  He denies any chest pain, shortness of breath, cough, or hemoptysis.  He denies any nausea, vomiting, diarrhea, constipation.  He denies any headache or visual changes.  Denies any rashes or skin changes.  He is here today for evaluation before starting cycle #31.  MEDICAL HISTORY: Past Medical History:  Diagnosis Date  . Anxiety   . COPD (chronic obstructive pulmonary disease) ([facility])   . GERD (gastroesophageal reflux disease)   . Headache   . Hypertension   . Pneumonia   . Situational depression   . Stage III squamous cell carcinoma of left lung ([facility]) 06/19/2017   Lungs & Epiglottis  . Tubular adenoma of colon 01/2015  . Tubular adenoma of colon 01/2015    ALLERGIES:  is allergic to escitalopram oxalate.  MEDICATIONS:  Current Outpatient Medications  Medication Sig Dispense Refill  . acyclovir (ZOVIRAX) 400 MG  tablet Take 1 tablet (400 mg total) by mouth 3 (three) times daily. 21 tablet 3  . clonazePAM (KLONOPIN) 0.5 MG tablet TAKE 1 TO 2 TABLETS        (0.5-1MG  TOTAL) TWICE A DAYAS NEEDED FOR ANXIETY 180 tablet 1  . diphenhydrAMINE (BENADRYL) 25 MG tablet Take 12.5 mg by mouth every 6 (six) hours as needed for itching.    . esomeprazole (NEXIUM) 40 MG capsule 1 po qam 90 capsule 3  . fexofenadine-pseudoephedrine (ALLEGRA-D 24) 180-240 MG 24 hr tablet Take 1 tablet by mouth daily.    . fluticasone furoate-vilanterol (BREO ELLIPTA) 100-25 MCG/INH AEPB INHALE 1 PUFF INTO THE LUNGS DAILY. 3 each 3  . Multiple Vitamin (MULTIVITAMIN WITH MINERALS) TABS tablet Take 1 tablet by mouth daily.    . pembrolizumab (KEYTRUDA) 100 MG/4ML SOLN Inject 2 mg/kg into the vein. 200mg  every 3 weeks.     No current facility-administered medications for this visit.     SURGICAL HISTORY:  Past Surgical History:  Procedure Laterality Date  . COLONOSCOPY    . DIRECT LARYNGOSCOPY N/A 06/25/2017   Procedure: DIRECT LARYNGOSCOPY AND BIOPSY;  Surgeon: Rozetta Nunnery, MD;  Location: [facility];  Service: ENT;  Laterality: N/A;  . EAR CYST EXCISION N/A 11/17/2013   Procedure: SEBACEOUS CYST CHEST;  Surgeon: Joyice Faster. Cornett, MD;  Location: [facility];  Service: General;  Laterality: N/A;  . IR GASTROSTOMY TUBE MOD SED  08/10/2017  . IR GASTROSTOMY TUBE REMOVAL  09/29/2017  . KNEE ARTHROSCOPY     LEFT  . LIPOMA EXCISION  N/A 11/17/2013   Procedure: EXCISION LIPOMA FOREHEAD;  Surgeon: Joyice Faster. Cornett, MD;  Location: [facility];  Service: General;  Laterality: N/A;  . LUNG BIOPSY Bilateral 06/12/2017   Procedure: LEFT LUNG BIOPSY;  Surgeon: Grace Isaac, MD;  Location: [facility];  Service: Thoracic;  Laterality: Bilateral;  . MICROLARYNGOSCOPY W/VOCAL CORD INJECTION Left 07/16/2018   Procedure: suspened micro laryngoscopy with jet ventilation and prolaryn injection;  Surgeon: Melida Quitter, MD;  Location: [facility];  Service: ENT;  Laterality: Left;  . PORTACATH PLACEMENT Right 07/01/2017   Procedure: INSERTION PORT-A-CATH - RIGHT IJ - placed with Fluoro and Ultrasound;  Surgeon: Grace Isaac, MD;  Location: [facility];  Service: Thoracic;  Laterality: Right;  Marland Kitchen VIDEO BRONCHOSCOPY WITH ENDOBRONCHIAL ULTRASOUND N/A 06/12/2017   Procedure: VIDEO BRONCHOSCOPY WITH ENDOBRONCHIAL ULTRASOUND;  Surgeon: Grace Isaac, MD;  Location: [facility];  Service: Thoracic;  Laterality: N/A;    REVIEW OF SYSTEMS:   Review of Systems  Constitutional: Negative for appetite change, chills, fatigue, fever and unexpected weight change.  HENT:   Negative for mouth sores, nosebleeds, sore throat and trouble swallowing.   Eyes: Negative for eye problems and icterus.  Respiratory: Negative for cough, hemoptysis, shortness of breath and wheezing.   Cardiovascular: Negative for chest pain and leg swelling.  Gastrointestinal: Negative for abdominal pain, constipation, diarrhea, nausea and vomiting.  Genitourinary: Negative for bladder incontinence, difficulty urinating, dysuria, frequency and hematuria.   Musculoskeletal: Negative for back pain, gait problem, neck pain and neck stiffness.  Skin: Negative for itching and rash.  Neurological: Negative for dizziness, extremity weakness, gait problem, headaches, light-headedness and seizures.  Hematological: Negative for adenopathy. Does not bruise/bleed easily.  Psychiatric/Behavioral: Negative for confusion, depression and sleep disturbance. The patient is not nervous/anxious.     PHYSICAL EXAMINATION:  Blood pressure 120/81, pulse 76, temperature 98.9 F (37.2 C), temperature source Oral, resp. rate 18, height 5\' 11"  (1.803 m), weight 154 lb 11.2 oz (70.2 kg), SpO2 97 %.  ECOG PERFORMANCE STATUS: 1 - Symptomatic but completely ambulatory  Physical Exam  Constitutional: Oriented to person, place, and time and well-developed, well-nourished, and in no  distress.  HENT:  Head: Normocephalic and atraumatic.  Mouth/Throat: Oropharynx is clear and moist. No oropharyngeal exudate.  Eyes: Conjunctivae are normal. Right eye exhibits no discharge. Left eye exhibits no discharge. No scleral icterus.  Neck: Normal range of motion. Neck supple.  Cardiovascular: Normal rate, regular rhythm, normal heart sounds and intact distal pulses.   Pulmonary/Chest: Effort normal and breath sounds normal. No respiratory distress. No wheezes. No rales.  Abdominal: Soft. Bowel sounds are normal. Exhibits no distension and no mass. There is no tenderness.  Musculoskeletal: Normal range of motion. Exhibits no edema.  Lymphadenopathy:    No cervical adenopathy.  Neurological: Alert and oriented to person, place, and time. Exhibits normal muscle tone. Gait normal. Coordination normal.  Skin: Skin is warm and dry. No rash noted. Not diaphoretic. No erythema. No pallor.  Psychiatric: Mood, memory and judgment normal.  Vitals reviewed.  LABORATORY DATA: Lab Results  Component Value Date   WBC 3.3 (L) 06/09/2019   HGB 16.2 06/09/2019   HCT 47.1 06/09/2019   MCV 96.1 06/09/2019   PLT 196 06/09/2019      Chemistry      Component Value Date/Time   NA 140 06/09/2019 0844   NA 141 10/29/2017 1049   K 4.5 06/09/2019 0844   K 3.3 (L) 10/29/2017 1049  CL 107 06/09/2019 0844   CO2 26 06/09/2019 0844   CO2 26 10/29/2017 1049   BUN <4 (L) 06/09/2019 0844   BUN 11.2 10/29/2017 1049   CREATININE 0.68 06/09/2019 0844   CREATININE 0.7 10/29/2017 1049   GLU 198 08/25/2017      Component Value Date/Time   CALCIUM 8.9 06/09/2019 0844   CALCIUM 9.0 10/29/2017 1049   ALKPHOS 67 06/09/2019 0844   ALKPHOS 82 10/29/2017 1049   AST 16 06/09/2019 0844   AST 10 10/29/2017 1049   ALT 14 06/09/2019 0844   ALT 8 10/29/2017 1049   BILITOT 0.3 06/09/2019 0844   BILITOT 0.22 10/29/2017 1049       RADIOGRAPHIC STUDIES:  Ct Soft Tissue Neck W Contrast  Result Date:  05/17/2019 CLINICAL DATA:  53 year old male with treated lung and epiglottis cancer in 2018. Currently on immunotherapy. Restaging. EXAM: CT NECK WITH CONTRAST TECHNIQUE: Multidetector CT imaging of the neck was performed using the standard protocol following the bolus administration of intravenous contrast. CONTRAST:  17mL OMNIPAQUE IOHEXOL 300 MG/ML  SOLN COMPARISON:  CT Chest today reported separately. Neck CTs 02/22/2019 and earlier. FINDINGS: Pharynx and larynx: Stable mild asymmetry of the vocal folds. Possible previous vocal cord injections. Stable sequelae of XRT with mild generalized pharyngeal mucosal space and epiglottic thickening. No abnormal enhancement or discrete mass. Negative parapharyngeal and retropharyngeal spaces. Salivary glands: Negative sublingual space. Stable submandibular glands and submandibular spaces. Parotid glands and spaces appears stable and within normal limits. Thyroid: Diminutive, negative. Lymph nodes: Stable small lymph nodes. No cervical lymphadenopathy. Stable cervical carotid atherosclerosis. Stable right IJ approach porta cath. The major vascular structures in the neck and at the skull base are patent. The left vertebral artery is dominant. Vascular: Negative. Limited intracranial: Visualized orbits: Negative. Mastoids and visualized paranasal sinuses: Previous left mastoidectomy. Small chronic left maxillary alveolar recess mucous retention cyst. Visualized paranasal sinuses and mastoids are stable and well pneumatized. Skeleton: Stable, negative. Upper chest: Reported separately today. IMPRESSION: 1. Satisfactory post treatment appearance of the neck. NI-RADS category 1. 2. CT Chest today reported separately. Electronically Signed   By: Genevie Ann M.D.   On: 05/17/2019 08:28   Ct Chest W Contrast  Result Date: 05/17/2019 CLINICAL DATA:  History of lung and epiglottis cancer, post chemo and radiation therapy. Patient is currently undergoing immunotherapy. EXAM: CT CHEST  WITH CONTRAST TECHNIQUE: Multidetector CT imaging of the chest was performed during intravenous contrast administration. CONTRAST:  8mL OMNIPAQUE IOHEXOL 300 MG/ML  SOLN COMPARISON:  02/22/2019 FINDINGS: Cardiovascular: Stable position of injectable Port-A-Cath. Normal heart size. No pericardial effusion. Mild calcific atherosclerotic disease of the coronary arteries. Mediastinum/Nodes: No enlarged mediastinal, hilar, or axillary lymph nodes. Thyroid gland, trachea, and esophagus demonstrate no significant findings. Lungs/Pleura: Similar appearing post radiation changes within the paramediastinal left lung. Similar appearance of soft tissue density about left bronchus intermedius and the central left bronchovascular structures, image 25-35/73, series 3. New 9 mm ground-glass nodule in the left upper lobe, image 42/166, series 6. Stable left upper lobe posterior pleural thickening. Upper Abdomen: No acute abnormality. Musculoskeletal: No suspicious osseous lesions. IMPRESSION: 1. Stable appearance of soft tissue density along the left bronchus intermedius and central left bronchovascular structures, favored to represent post treatment changes. 2. New 9 mm ground-glass nodule in the left upper lobe, suspicious for recurrence. Electronically Signed   By: Fidela Salisbury M.D.   On: 05/17/2019 08:33     ASSESSMENT/PLAN:  This is a very pleasant 53 year old Caucasian  male diagnosed with stage III/IV non-small cell lung cancer, squamous cell carcinoma.  He presented with a left hilar mass in addition to mediastinal and left supraclavicular lymphadenopathy and right hilar lymph adenopathy.  He was diagnosed in August 2018.  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  He previously underwent concurrent chemoradiation to the lung as well as at the epiglottic area under the care of Dr. Tammi Klippel.  He is status post 6 cycles.  He tolerated it well except for radiation-induced  esophagitis as well as weight loss and fatigue.  He had a partial response to treatment.  He is currently undergoing immunotherapy with Keytruda 200 mg IV every 3 weeks.  He is status post 30 cycles.  He has been tolerating treatment well without any adverse effects. Labs were reviewed with the patient. I recommend he proceed with cycle #31 today as scheduled.  I will see him back for follow-up visit in 3 weeks for evaluation and to review his scan before starting cycle #31.  The patient was advised to call immediately if he has any concerning symptoms in the interval. The patient voices understanding of current disease status and treatment options and is in agreement with the current care plan. All questions were answered. The patient knows to call the clinic with any problems, questions or concerns. We can certainly see the patient much sooner if necessary   No orders of the defined types were placed in this encounter.    Zea Kostka L Tanaja Ganger, PA-C 06/09/19

## 2019-06-16 ENCOUNTER — Ambulatory Visit: Payer: 59 | Admitting: Internal Medicine

## 2019-06-30 ENCOUNTER — Inpatient Hospital Stay (HOSPITAL_BASED_OUTPATIENT_CLINIC_OR_DEPARTMENT_OTHER): Payer: 59 | Admitting: Internal Medicine

## 2019-06-30 ENCOUNTER — Inpatient Hospital Stay: Payer: 59

## 2019-06-30 ENCOUNTER — Telehealth: Payer: Self-pay | Admitting: Internal Medicine

## 2019-06-30 ENCOUNTER — Encounter: Payer: Self-pay | Admitting: Internal Medicine

## 2019-06-30 ENCOUNTER — Inpatient Hospital Stay: Payer: 59 | Attending: Internal Medicine

## 2019-06-30 ENCOUNTER — Other Ambulatory Visit: Payer: Self-pay

## 2019-06-30 VITALS — BP 123/84 | HR 68 | Temp 98.5°F | Resp 18 | Ht 71.0 in | Wt 153.1 lb

## 2019-06-30 DIAGNOSIS — F419 Anxiety disorder, unspecified: Secondary | ICD-10-CM | POA: Insufficient documentation

## 2019-06-30 DIAGNOSIS — C321 Malignant neoplasm of supraglottis: Secondary | ICD-10-CM

## 2019-06-30 DIAGNOSIS — Z5112 Encounter for antineoplastic immunotherapy: Secondary | ICD-10-CM

## 2019-06-30 DIAGNOSIS — R5382 Chronic fatigue, unspecified: Secondary | ICD-10-CM

## 2019-06-30 DIAGNOSIS — C3412 Malignant neoplasm of upper lobe, left bronchus or lung: Secondary | ICD-10-CM | POA: Diagnosis present

## 2019-06-30 DIAGNOSIS — J449 Chronic obstructive pulmonary disease, unspecified: Secondary | ICD-10-CM | POA: Insufficient documentation

## 2019-06-30 DIAGNOSIS — I10 Essential (primary) hypertension: Secondary | ICD-10-CM | POA: Diagnosis not present

## 2019-06-30 DIAGNOSIS — Z7951 Long term (current) use of inhaled steroids: Secondary | ICD-10-CM | POA: Diagnosis not present

## 2019-06-30 DIAGNOSIS — Z79899 Other long term (current) drug therapy: Secondary | ICD-10-CM | POA: Insufficient documentation

## 2019-06-30 LAB — CBC WITH DIFFERENTIAL (CANCER CENTER ONLY)
Abs Immature Granulocytes: 0.02 10*3/uL (ref 0.00–0.07)
Basophils Absolute: 0 10*3/uL (ref 0.0–0.1)
Basophils Relative: 1 %
Eosinophils Absolute: 0 10*3/uL (ref 0.0–0.5)
Eosinophils Relative: 1 %
HCT: 46.9 % (ref 39.0–52.0)
Hemoglobin: 15.9 g/dL (ref 13.0–17.0)
Immature Granulocytes: 1 %
Lymphocytes Relative: 11 %
Lymphs Abs: 0.4 10*3/uL — ABNORMAL LOW (ref 0.7–4.0)
MCH: 33.1 pg (ref 26.0–34.0)
MCHC: 33.9 g/dL (ref 30.0–36.0)
MCV: 97.7 fL (ref 80.0–100.0)
Monocytes Absolute: 0.5 10*3/uL (ref 0.1–1.0)
Monocytes Relative: 14 %
Neutro Abs: 2.8 10*3/uL (ref 1.7–7.7)
Neutrophils Relative %: 72 %
Platelet Count: 210 10*3/uL (ref 150–400)
RBC: 4.8 MIL/uL (ref 4.22–5.81)
RDW: 11.7 % (ref 11.5–15.5)
WBC Count: 3.8 10*3/uL — ABNORMAL LOW (ref 4.0–10.5)
nRBC: 0 % (ref 0.0–0.2)

## 2019-06-30 LAB — CMP (CANCER CENTER ONLY)
ALT: 11 U/L (ref 0–44)
AST: 14 U/L — ABNORMAL LOW (ref 15–41)
Albumin: 3.7 g/dL (ref 3.5–5.0)
Alkaline Phosphatase: 66 U/L (ref 38–126)
Anion gap: 9 (ref 5–15)
BUN: 8 mg/dL (ref 6–20)
CO2: 24 mmol/L (ref 22–32)
Calcium: 8.7 mg/dL — ABNORMAL LOW (ref 8.9–10.3)
Chloride: 105 mmol/L (ref 98–111)
Creatinine: 0.71 mg/dL (ref 0.61–1.24)
GFR, Est AFR Am: >60 mL/min (ref >60)
GFR, Estimated: >60 mL/min (ref >60)
Glucose, Bld: 102 mg/dL — ABNORMAL HIGH (ref 70–99)
Potassium: 4.2 mmol/L (ref 3.5–5.1)
Sodium: 138 mmol/L (ref 135–145)
Total Bilirubin: 0.3 mg/dL (ref 0.3–1.2)
Total Protein: 6.3 g/dL — ABNORMAL LOW (ref 6.5–8.1)

## 2019-06-30 LAB — TSH: TSH: 2.866 u[IU]/mL (ref 0.320–4.118)

## 2019-06-30 MED ORDER — SODIUM CHLORIDE 0.9% FLUSH
10.0000 mL | Freq: Once | INTRAVENOUS | Status: AC
  Administered 2019-06-30: 09:00:00 10 mL
  Filled 2019-06-30: qty 10

## 2019-06-30 MED ORDER — SODIUM CHLORIDE 0.9% FLUSH
10.0000 mL | INTRAVENOUS | Status: DC | PRN
  Administered 2019-06-30: 10 mL
  Filled 2019-06-30: qty 10

## 2019-06-30 MED ORDER — SODIUM CHLORIDE 0.9 % IV SOLN
Freq: Once | INTRAVENOUS | Status: AC
  Administered 2019-06-30: 10:00:00 via INTRAVENOUS
  Filled 2019-06-30: qty 250

## 2019-06-30 MED ORDER — HEPARIN SOD (PORK) LOCK FLUSH 100 UNIT/ML IV SOLN
500.0000 [IU] | Freq: Once | INTRAVENOUS | Status: AC | PRN
  Administered 2019-06-30: 500 [IU]
  Filled 2019-06-30: qty 5

## 2019-06-30 MED ORDER — SODIUM CHLORIDE 0.9 % IV SOLN
200.0000 mg | Freq: Once | INTRAVENOUS | Status: AC
  Administered 2019-06-30: 200 mg via INTRAVENOUS
  Filled 2019-06-30: qty 8

## 2019-06-30 NOTE — Progress Notes (Signed)
[facility] [phone]   [phone]  OFFICE PROGRESS NOTE  [name], [name], MD [place] [address]  DIAGNOSIS:  1) Stage IIB/IV (T3, N3, M1a) non-small cell lung cancer, squamous cell carcinoma presented with large left hilar mass in addition to mediastinal and left supraclavicular lymphadenopathy as well as contralateral right upper lobe nodule diagnosed in August 2018. PDL 1 expression: 90%. 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 First dose 06/29/2017. Status post 6 cycles. Last dose was given 08/03/2017.  CURRENT THERAPY: Immunotherapy with Ketruda 200 MG IV every 3 weeks, first dose 09/17/2017.  Status post 31 cycles.  INTERVAL HISTORY: Trevor Zavala 53 y.o. male returns to the clinic today for follow-up visit.  The patient is feeling fine today with no concerning complaints.  He denied having any chest pain, shortness of breath, cough or hemoptysis.  He denied having any nausea, vomiting, diarrhea or constipation.  The hoarseness of his voice has improved.  He denied having any recent weight loss or night sweats.  He has no headache or visual changes.  He is here today for evaluation before starting cycle #32.  MEDICAL HISTORY: Past Medical History:  Diagnosis Date  . Anxiety   . COPD (chronic obstructive pulmonary disease) ([facility])   . GERD (gastroesophageal reflux disease)   . Headache   . Hypertension   . Pneumonia   . Situational depression   . Stage III squamous cell carcinoma of left lung ([facility]) 06/19/2017   Lungs & Epiglottis  . Tubular adenoma of colon 01/2015  . Tubular adenoma of colon 01/2015    ALLERGIES:  is allergic to escitalopram oxalate.  MEDICATIONS:  Current Outpatient Medications  Medication Sig Dispense Refill  . acyclovir (ZOVIRAX) 400 MG tablet Take 1 tablet  (400 mg total) by mouth 3 (three) times daily. 21 tablet 3  . clonazePAM (KLONOPIN) 0.5 MG tablet TAKE 1 TO 2 TABLETS        (0.5-1MG  TOTAL) TWICE A DAYAS NEEDED FOR ANXIETY 180 tablet 1  . diphenhydrAMINE (BENADRYL) 25 MG tablet Take 12.5 mg by mouth every 6 (six) hours as needed for itching.    . esomeprazole (NEXIUM) 40 MG capsule 1 po qam 90 capsule 3  . fexofenadine-pseudoephedrine (ALLEGRA-D 24) 180-240 MG 24 hr tablet Take 1 tablet by mouth daily.    . fluticasone furoate-vilanterol (BREO ELLIPTA) 100-25 MCG/INH AEPB INHALE 1 PUFF INTO THE LUNGS DAILY. 3 each 3  . Multiple Vitamin (MULTIVITAMIN WITH MINERALS) TABS tablet Take 1 tablet by mouth daily.    . pembrolizumab (KEYTRUDA) 100 MG/4ML SOLN Inject 2 mg/kg into the vein. 200mg  every 3 weeks.     No current facility-administered medications for this visit.     SURGICAL HISTORY:  Past Surgical History:  Procedure Laterality Date  . COLONOSCOPY    . DIRECT LARYNGOSCOPY N/A 06/25/2017   Procedure: DIRECT LARYNGOSCOPY AND BIOPSY;  Surgeon: Rozetta Nunnery, MD;  Location: [facility];  Service: ENT;  Laterality: N/A;  . EAR CYST EXCISION N/A 11/17/2013   Procedure: SEBACEOUS CYST CHEST;  Surgeon: Joyice Faster. Cornett, MD;  Location: [facility];  Service: General;  Laterality: N/A;  . IR GASTROSTOMY TUBE MOD SED  08/10/2017  . IR GASTROSTOMY TUBE REMOVAL  09/29/2017  . KNEE ARTHROSCOPY     LEFT  .  LIPOMA EXCISION N/A 11/17/2013   Procedure: EXCISION LIPOMA FOREHEAD;  Surgeon: Joyice Faster. Cornett, MD;  Location: [facility];  Service: General;  Laterality: N/A;  . LUNG BIOPSY Bilateral 06/12/2017   Procedure: LEFT LUNG BIOPSY;  Surgeon: Grace Isaac, MD;  Location: [facility];  Service: Thoracic;  Laterality: Bilateral;  . MICROLARYNGOSCOPY W/VOCAL CORD INJECTION Left 07/16/2018   Procedure: suspened micro laryngoscopy with jet ventilation and prolaryn injection;  Surgeon: Melida Quitter, MD;   Location: [facility];  Service: ENT;  Laterality: Left;  . PORTACATH PLACEMENT Right 07/01/2017   Procedure: INSERTION PORT-A-CATH - RIGHT IJ - placed with Fluoro and Ultrasound;  Surgeon: Grace Isaac, MD;  Location: [facility];  Service: Thoracic;  Laterality: Right;  Marland Kitchen VIDEO BRONCHOSCOPY WITH ENDOBRONCHIAL ULTRASOUND N/A 06/12/2017   Procedure: VIDEO BRONCHOSCOPY WITH ENDOBRONCHIAL ULTRASOUND;  Surgeon: Grace Isaac, MD;  Location: [facility];  Service: Thoracic;  Laterality: N/A;    REVIEW OF SYSTEMS:  A comprehensive review of systems was negative.   PHYSICAL EXAMINATION: General appearance: alert, cooperative and no distress Head: Normocephalic, without obvious abnormality, atraumatic Neck: no adenopathy, no JVD, supple, symmetrical, trachea midline and thyroid not enlarged, symmetric, no tenderness/mass/nodules Lymph nodes: Cervical, supraclavicular, and axillary nodes normal. Resp: clear to auscultation bilaterally Back: symmetric, no curvature. ROM normal. No CVA tenderness. Cardio: regular rate and rhythm, S1, S2 normal, no murmur, click, rub or gallop GI: soft, non-tender; bowel sounds normal; no masses,  no organomegaly Extremities: extremities normal, atraumatic, no cyanosis or edema  ECOG PERFORMANCE STATUS: 0 - Asymptomatic  Blood pressure 123/84, pulse 68, temperature 98.5 F (36.9 C), temperature source Oral, resp. rate 18, height 5\' 11"  (1.803 m), weight 153 lb 1.6 oz (69.4 kg), SpO2 99 %.  LABORATORY DATA: Lab Results  Component Value Date   WBC 3.3 (L) 06/09/2019   HGB 16.2 06/09/2019   HCT 47.1 06/09/2019   MCV 96.1 06/09/2019   PLT 196 06/09/2019      Chemistry      Component Value Date/Time   NA 140 06/09/2019 0844   NA 141 10/29/2017 1049   K 4.5 06/09/2019 0844   K 3.3 (L) 10/29/2017 1049   CL 107 06/09/2019 0844   CO2 26 06/09/2019 0844   CO2 26 10/29/2017 1049   BUN <4 (L) 06/09/2019 0844   BUN 11.2 10/29/2017 1049   CREATININE 0.68 06/09/2019 0844    CREATININE 0.7 10/29/2017 1049   GLU 198 08/25/2017      Component Value Date/Time   CALCIUM 8.9 06/09/2019 0844   CALCIUM 9.0 10/29/2017 1049   ALKPHOS 67 06/09/2019 0844   ALKPHOS 82 10/29/2017 1049   AST 16 06/09/2019 0844   AST 10 10/29/2017 1049   ALT 14 06/09/2019 0844   ALT 8 10/29/2017 1049   BILITOT 0.3 06/09/2019 0844   BILITOT 0.22 10/29/2017 1049       RADIOGRAPHIC STUDIES: No results found.  ASSESSMENT AND PLAN: This is a very pleasant 53 years old white male with recently diagnosed stage IIIB/IV non-small cell lung cancer, adenocarcinoma presented with large left hilar mass in addition to mediastinal and left supraclavicular lymphadenopathy as well as suspicious right upper lobe pulmonary nodule diagnosed in August 2018. 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 He underwent a course of concurrent chemoradiation to the lung as well as the epiglottic area under the care of Dr. Tammi Klippel. He is status post 6 cycle. He tolerated this course  of treatment well except for the radiation induced esophagitis as well as weight loss and fatigue. The patient had partial response to the previous treatment. He is currently on treatment with Keytruda 200 mg IV every 3 weeks status post 31 cycles. The patient has been tolerating this treatment well with no concerning adverse effects. I recommended for him to continue with his current treatment and he will proceed with cycle #32 today. I will see him back for follow-up visit in 3 weeks for evaluation before the next cycle of his treatment. He was advised to call immediately if he has any concerning symptoms in the interval. The patient voices understanding of current disease status and treatment options and is in agreement with the current care plan. All questions were answered. The patient knows to call the clinic with any problems, questions or concerns. We can certainly see the patient much  sooner if necessary. Disclaimer: This note was dictated with voice recognition software. Similar sounding words can inadvertently be transcribed and may not be corrected upon review.

## 2019-06-30 NOTE — Patient Instructions (Signed)
[facility] Discharge Instructions for Patients Receiving Chemotherapy  Today you received the following chemotherapy agents:  Keytruda.  To help prevent nausea and vomiting after your treatment, we encourage you to take your nausea medication as directed.   If you develop nausea and vomiting that is not controlled by your nausea medication, call the clinic.   BELOW ARE SYMPTOMS THAT SHOULD BE REPORTED IMMEDIATELY:  *FEVER GREATER THAN 100.5 F  *CHILLS WITH OR WITHOUT FEVER  NAUSEA AND VOMITING THAT IS NOT CONTROLLED WITH YOUR NAUSEA MEDICATION  *UNUSUAL SHORTNESS OF BREATH  *UNUSUAL BRUISING OR BLEEDING  TENDERNESS IN MOUTH AND THROAT WITH OR WITHOUT PRESENCE OF ULCERS  *URINARY PROBLEMS  *BOWEL PROBLEMS  UNUSUAL RASH Items with * indicate a potential emergency and should be followed up as soon as possible.  Feel free to call the clinic should you have any questions or concerns. The clinic phone number is [phone].  Please show the CHEMO ALERT CARD at check-in to the Emergency Department and triage nurse.    

## 2019-06-30 NOTE — Telephone Encounter (Signed)
Added additional cycles per 8/20 los - pt to get an updated schedule next visit.

## 2019-07-21 ENCOUNTER — Inpatient Hospital Stay (HOSPITAL_BASED_OUTPATIENT_CLINIC_OR_DEPARTMENT_OTHER): Payer: 59 | Admitting: Physician Assistant

## 2019-07-21 ENCOUNTER — Inpatient Hospital Stay: Payer: 59 | Attending: Internal Medicine

## 2019-07-21 ENCOUNTER — Inpatient Hospital Stay: Payer: 59

## 2019-07-21 ENCOUNTER — Other Ambulatory Visit: Payer: Self-pay

## 2019-07-21 ENCOUNTER — Telehealth: Payer: Self-pay | Admitting: Internal Medicine

## 2019-07-21 VITALS — BP 121/82 | HR 75 | Temp 99.1°F | Resp 18 | Ht 71.0 in | Wt 153.8 lb

## 2019-07-21 DIAGNOSIS — C321 Malignant neoplasm of supraglottis: Secondary | ICD-10-CM

## 2019-07-21 DIAGNOSIS — J449 Chronic obstructive pulmonary disease, unspecified: Secondary | ICD-10-CM | POA: Insufficient documentation

## 2019-07-21 DIAGNOSIS — Z5112 Encounter for antineoplastic immunotherapy: Secondary | ICD-10-CM

## 2019-07-21 DIAGNOSIS — R5382 Chronic fatigue, unspecified: Secondary | ICD-10-CM

## 2019-07-21 DIAGNOSIS — Z79899 Other long term (current) drug therapy: Secondary | ICD-10-CM | POA: Insufficient documentation

## 2019-07-21 DIAGNOSIS — F419 Anxiety disorder, unspecified: Secondary | ICD-10-CM | POA: Insufficient documentation

## 2019-07-21 DIAGNOSIS — Z7951 Long term (current) use of inhaled steroids: Secondary | ICD-10-CM | POA: Insufficient documentation

## 2019-07-21 DIAGNOSIS — C3412 Malignant neoplasm of upper lobe, left bronchus or lung: Secondary | ICD-10-CM

## 2019-07-21 DIAGNOSIS — I10 Essential (primary) hypertension: Secondary | ICD-10-CM | POA: Diagnosis not present

## 2019-07-21 DIAGNOSIS — Z95828 Presence of other vascular implants and grafts: Secondary | ICD-10-CM

## 2019-07-21 LAB — CMP (CANCER CENTER ONLY)
ALT: 11 U/L (ref 0–44)
AST: 16 U/L (ref 15–41)
Albumin: 3.9 g/dL (ref 3.5–5.0)
Alkaline Phosphatase: 60 U/L (ref 38–126)
Anion gap: 7 (ref 5–15)
BUN: 7 mg/dL (ref 6–20)
CO2: 28 mmol/L (ref 22–32)
Calcium: 8.9 mg/dL (ref 8.9–10.3)
Chloride: 105 mmol/L (ref 98–111)
Creatinine: 0.7 mg/dL (ref 0.61–1.24)
GFR, Est AFR Am: >60 mL/min (ref >60)
GFR, Estimated: >60 mL/min (ref >60)
Glucose, Bld: 97 mg/dL (ref 70–99)
Potassium: 4.3 mmol/L (ref 3.5–5.1)
Sodium: 140 mmol/L (ref 135–145)
Total Bilirubin: 0.4 mg/dL (ref 0.3–1.2)
Total Protein: 6.4 g/dL — ABNORMAL LOW (ref 6.5–8.1)

## 2019-07-21 LAB — CBC WITH DIFFERENTIAL (CANCER CENTER ONLY)
Abs Immature Granulocytes: 0.01 10*3/uL (ref 0.00–0.07)
Basophils Absolute: 0 10*3/uL (ref 0.0–0.1)
Basophils Relative: 1 %
Eosinophils Absolute: 0 10*3/uL (ref 0.0–0.5)
Eosinophils Relative: 0 %
HCT: 47.6 % (ref 39.0–52.0)
Hemoglobin: 16.1 g/dL (ref 13.0–17.0)
Immature Granulocytes: 0 %
Lymphocytes Relative: 9 %
Lymphs Abs: 0.5 10*3/uL — ABNORMAL LOW (ref 0.7–4.0)
MCH: 32.7 pg (ref 26.0–34.0)
MCHC: 33.8 g/dL (ref 30.0–36.0)
MCV: 96.6 fL (ref 80.0–100.0)
Monocytes Absolute: 0.5 10*3/uL (ref 0.1–1.0)
Monocytes Relative: 8 %
Neutro Abs: 4.8 10*3/uL (ref 1.7–7.7)
Neutrophils Relative %: 82 %
Platelet Count: 196 10*3/uL (ref 150–400)
RBC: 4.93 MIL/uL (ref 4.22–5.81)
RDW: 11.8 % (ref 11.5–15.5)
WBC Count: 5.9 10*3/uL (ref 4.0–10.5)
nRBC: 0 % (ref 0.0–0.2)

## 2019-07-21 LAB — TSH: TSH: 2.528 u[IU]/mL (ref 0.320–4.118)

## 2019-07-21 MED ORDER — SODIUM CHLORIDE 0.9 % IV SOLN
200.0000 mg | Freq: Once | INTRAVENOUS | Status: AC
  Administered 2019-07-21: 200 mg via INTRAVENOUS
  Filled 2019-07-21: qty 8

## 2019-07-21 MED ORDER — SODIUM CHLORIDE 0.9 % IV SOLN
Freq: Once | INTRAVENOUS | Status: AC
  Administered 2019-07-21: 12:00:00 via INTRAVENOUS
  Filled 2019-07-21: qty 250

## 2019-07-21 MED ORDER — HEPARIN SOD (PORK) LOCK FLUSH 100 UNIT/ML IV SOLN
500.0000 [IU] | Freq: Once | INTRAVENOUS | Status: AC | PRN
  Administered 2019-07-21: 14:00:00 500 [IU]
  Filled 2019-07-21: qty 5

## 2019-07-21 MED ORDER — SODIUM CHLORIDE 0.9% FLUSH
10.0000 mL | INTRAVENOUS | Status: DC | PRN
  Administered 2019-07-21: 14:00:00 10 mL
  Filled 2019-07-21: qty 10

## 2019-07-21 MED ORDER — SODIUM CHLORIDE 0.9% FLUSH
10.0000 mL | Freq: Once | INTRAVENOUS | Status: AC
  Administered 2019-07-21: 11:00:00 10 mL
  Filled 2019-07-21: qty 10

## 2019-07-21 NOTE — Telephone Encounter (Signed)
Appt already scheduled to the end of treatment plan per 9/10 los.

## 2019-07-21 NOTE — Patient Instructions (Addendum)
[place] Discharge Instructions for Patients Receiving Chemotherapy  Today you received the following chemotherapy agents: Pembrolizumab [facility]).  To help prevent nausea and vomiting after your treatment, we encourage you to take your nausea medication as directed.   If you develop nausea and vomiting that is not controlled by your nausea medication, call the clinic.   BELOW ARE SYMPTOMS THAT SHOULD BE REPORTED IMMEDIATELY:  *FEVER GREATER THAN 100.5 F  *CHILLS WITH OR WITHOUT FEVER  NAUSEA AND VOMITING THAT IS NOT CONTROLLED WITH YOUR NAUSEA MEDICATION  *UNUSUAL SHORTNESS OF BREATH  *UNUSUAL BRUISING OR BLEEDING  TENDERNESS IN MOUTH AND THROAT WITH OR WITHOUT PRESENCE OF ULCERS  *URINARY PROBLEMS  *BOWEL PROBLEMS  UNUSUAL RASH Items with * indicate a potential emergency and should be followed up as soon as possible.  Feel free to call the clinic should you have any questions or concerns. The clinic phone number is [phone].  Please show the [place] at check-in to the Emergency Department and triage nurse.  Coronavirus (COVID-19) Are you at risk?  Are you at risk for the Coronavirus (COVID-19)?  To be considered HIGH RISK for Coronavirus (COVID-19), you have to meet the following criteria:  . Traveled to [place], [place], [place], [place] or [place]; or in the [place] to [place], [place], [place], or [place]; and have fever, cough, and shortness of breath within the last 2 weeks of travel OR . Been in close contact with a person diagnosed with COVID-19 within the last 2 weeks and have fever, cough, and shortness of breath . IF YOU DO NOT MEET THESE CRITERIA, YOU ARE CONSIDERED LOW RISK FOR COVID-19.  What to do if you are HIGH RISK for COVID-19?  Marland Kitchen If you are having a medical emergency, call 911. . Seek medical care right away. Before you go to a doctor's office, urgent care or emergency department, call ahead and tell them  about your recent travel, contact with someone diagnosed with COVID-19, and your symptoms. You should receive instructions from your physician's office regarding next steps of care.  . When you arrive at healthcare provider, tell the healthcare staff immediately you have returned from visiting [place], [place], [place], [place] or [place]; or traveled in the [place] to [place], [place], [place], or [place]; in the last two weeks or you have been in close contact with a person diagnosed with COVID-19 in the last 2 weeks.   . Tell the health care staff about your symptoms: fever, cough and shortness of breath. . After you have been seen by a medical provider, you will be either: o Tested for (COVID-19) and discharged home on quarantine except to seek medical care if symptoms worsen, and asked to  - Stay home and avoid contact with others until you get your results (4-5 days)  - Avoid travel on public transportation if possible (such as bus, train, or airplane) or o Sent to the Emergency Department by EMS for evaluation, COVID-19 testing, and possible admission depending on your condition and test results.  What to do if you are LOW RISK for COVID-19?  Reduce your risk of any infection by using the same precautions used for avoiding the common cold or flu:  Marland Kitchen Wash your hands often with soap and warm water for at least 20 seconds.  If soap and water are not readily available, use an alcohol-based hand sanitizer with at least 60% alcohol.  . If coughing or  sneezing, cover your mouth and nose by coughing or sneezing into the elbow areas of your shirt or coat, into a tissue or into your sleeve (not your hands). . Avoid shaking hands with others and consider head nods or verbal greetings only. . Avoid touching your eyes, nose, or mouth with unwashed hands.  . Avoid close contact with people who are sick. . Avoid places or events with large numbers of people in one location, like concerts or  sporting events. . Carefully consider travel plans you have or are making. . If you are planning any travel outside or inside the [place], visit the CDC's Travelers' Health webpage for the latest health notices. . If you have some symptoms but not all symptoms, continue to monitor at home and seek medical attention if your symptoms worsen. . If you are having a medical emergency, call 911.   [place] / e-Visit: [email or web address]         [facility]: [facility]: [phone]                   [facility]: [phone]

## 2019-07-21 NOTE — Progress Notes (Signed)
[facility] OFFICE PROGRESS NOTE  [name], [name], MD [place] [address]  DIAGNOSIS:  1) Stage IIB/IV (T3, N3, M1a) non-small cell lung cancer, squamous cell carcinoma presented with large left hilar mass in addition to mediastinal and left supraclavicular lymphadenopathy as well as contralateral right upper lobe nodule diagnosed in August 2018. PDL 1 expression: 90%. 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 First dose 06/29/2017. Status post 6 cycles. Last dose was given 08/03/2017  CURRENT THERAPY: Immunotherapy with Ketruda 200 MG IV every 3 weeks, first dose 09/17/2017. Status post 32 cycles.  INTERVAL HISTORY: Trevor Zavala 53 y.o. male returns to the clinic for a follow-up visit.  The patient is feeling well today without any concerning complaints.  He has been tolerating treatment with immunotherapy well without any adverse effects.  He denies any fever, chills, night sweats, or weight loss.  He denies any chest pain, shortness of breath, cough, or hemoptysis.  He denies any nausea, vomiting, diarrhea, constipation.  He denies any headache or visual changes.  Denies any rashes or skin changes.  He is here today for evaluation before starting cycle #33.  MEDICAL HISTORY: Past Medical History:  Diagnosis Date  . Anxiety   . COPD (chronic obstructive pulmonary disease) ([place])   . GERD (gastroesophageal reflux disease)   . Headache   . Hypertension   . Pneumonia   . Situational depression   . Stage III squamous cell carcinoma of left lung ([place]) 06/19/2017   Lungs & Epiglottis  . Tubular adenoma of colon 01/2015  . Tubular adenoma of colon 01/2015    ALLERGIES:  is allergic to escitalopram oxalate.  MEDICATIONS:  Current Outpatient Medications  Medication Sig Dispense Refill  . acyclovir (ZOVIRAX) 400 MG  tablet Take 1 tablet (400 mg total) by mouth 3 (three) times daily. 21 tablet 3  . clonazePAM (KLONOPIN) 0.5 MG tablet TAKE 1 TO 2 TABLETS        (0.5-1MG  TOTAL) TWICE A DAYAS NEEDED FOR ANXIETY 180 tablet 1  . diphenhydrAMINE (BENADRYL) 25 MG tablet Take 12.5 mg by mouth every 6 (six) hours as needed for itching.    . esomeprazole (NEXIUM) 40 MG capsule 1 po qam 90 capsule 3  . fexofenadine-pseudoephedrine (ALLEGRA-D 24) 180-240 MG 24 hr tablet Take 1 tablet by mouth daily.    . fluticasone furoate-vilanterol (BREO ELLIPTA) 100-25 MCG/INH AEPB INHALE 1 PUFF INTO THE LUNGS DAILY. 3 each 3  . Multiple Vitamin (MULTIVITAMIN WITH MINERALS) TABS tablet Take 1 tablet by mouth daily.    . pembrolizumab (KEYTRUDA) 100 MG/4ML SOLN Inject 2 mg/kg into the vein. 200mg  every 3 weeks.     No current facility-administered medications for this visit.    Facility-Administered Medications Ordered in Other Visits  Medication Dose Route Frequency Provider Last Rate Last Dose  . heparin lock flush 100 unit/mL  500 Units Intracatheter Once PRN Curt Bears, MD      . pembrolizumab [facility]) 200 mg in sodium chloride 0.9 % 50 mL chemo infusion  200 mg Intravenous Once Curt Bears, MD      . sodium chloride flush (NS) 0.9 % injection 10 mL  10 mL Intracatheter PRN Curt Bears, MD        SURGICAL HISTORY:  Past Surgical History:  Procedure Laterality Date  . COLONOSCOPY    . DIRECT  LARYNGOSCOPY N/A 06/25/2017   Procedure: DIRECT LARYNGOSCOPY AND BIOPSY;  Surgeon: Rozetta Nunnery, MD;  Location: [facility];  Service: ENT;  Laterality: N/A;  . EAR CYST EXCISION N/A 11/17/2013   Procedure: SEBACEOUS CYST CHEST;  Surgeon: Joyice Faster. Cornett, MD;  Location: [facility];  Service: General;  Laterality: N/A;  . IR GASTROSTOMY TUBE MOD SED  08/10/2017  . IR GASTROSTOMY TUBE REMOVAL  09/29/2017  . KNEE ARTHROSCOPY     LEFT  . LIPOMA EXCISION N/A 11/17/2013   Procedure:  EXCISION LIPOMA FOREHEAD;  Surgeon: Joyice Faster. Cornett, MD;  Location: [facility];  Service: General;  Laterality: N/A;  . LUNG BIOPSY Bilateral 06/12/2017   Procedure: LEFT LUNG BIOPSY;  Surgeon: Grace Isaac, MD;  Location: [facility];  Service: Thoracic;  Laterality: Bilateral;  . MICROLARYNGOSCOPY W/VOCAL CORD INJECTION Left 07/16/2018   Procedure: suspened micro laryngoscopy with jet ventilation and prolaryn injection;  Surgeon: Melida Quitter, MD;  Location: [facility];  Service: ENT;  Laterality: Left;  . PORTACATH PLACEMENT Right 07/01/2017   Procedure: INSERTION PORT-A-CATH - RIGHT IJ - placed with Fluoro and Ultrasound;  Surgeon: Grace Isaac, MD;  Location: [facility];  Service: Thoracic;  Laterality: Right;  Marland Kitchen VIDEO BRONCHOSCOPY WITH ENDOBRONCHIAL ULTRASOUND N/A 06/12/2017   Procedure: VIDEO BRONCHOSCOPY WITH ENDOBRONCHIAL ULTRASOUND;  Surgeon: Grace Isaac, MD;  Location: [facility];  Service: Thoracic;  Laterality: N/A;    REVIEW OF SYSTEMS:   Review of Systems  Constitutional: Negative for appetite change, chills, fatigue, fever and unexpected weight change.  HENT:   Negative for mouth sores, nosebleeds, sore throat and trouble swallowing.   Eyes: Negative for eye problems and icterus.  Respiratory: Negative for cough, hemoptysis, shortness of breath and wheezing.   Cardiovascular: Negative for chest pain and leg swelling.  Gastrointestinal: Negative for abdominal pain, constipation, diarrhea, nausea and vomiting.  Genitourinary: Negative for bladder incontinence, difficulty urinating, dysuria, frequency and hematuria.   Musculoskeletal: Negative for back pain, gait problem, neck pain and neck stiffness.  Skin: Negative for itching and rash.  Neurological: Negative for dizziness, extremity weakness, gait problem, headaches, light-headedness and seizures.  Hematological: Negative for adenopathy. Does not bruise/bleed easily.  Psychiatric/Behavioral: Negative for confusion,  depression and sleep disturbance. The patient is not nervous/anxious.     PHYSICAL EXAMINATION:  Blood pressure 121/82, pulse 75, temperature 99.1 F (37.3 C), temperature source Oral, resp. rate 18, height 5\' 11"  (1.803 m), weight 153 lb 12.8 oz (69.8 kg), SpO2 98 %.  ECOG PERFORMANCE STATUS: 0 - Asymptomatic  Physical Exam  Constitutional: Oriented to person, place, and time and well-developed, well-nourished, and in no distress.  HENT:  Head: Normocephalic and atraumatic.  Mouth/Throat: Oropharynx is clear and moist. No oropharyngeal exudate.  Eyes: Conjunctivae are normal. Right eye exhibits no discharge. Left eye exhibits no discharge. No scleral icterus.  Neck: Normal range of motion. Neck supple.  Cardiovascular: Normal rate, regular rhythm, normal heart sounds and intact distal pulses.   Pulmonary/Chest: Effort normal and breath sounds normal. No respiratory distress. No wheezes. No rales.  Abdominal: Soft. Bowel sounds are normal. Exhibits no distension and no mass. There is no tenderness.  Musculoskeletal: Normal range of motion. Exhibits no edema.  Lymphadenopathy:    No cervical adenopathy.  Neurological: Alert and oriented to person, place, and time. Exhibits normal muscle tone. Gait normal. Coordination normal.  Skin: Skin is warm and dry. No rash noted. Not diaphoretic. No erythema. No pallor.  Psychiatric: Mood, memory and judgment normal.  Vitals reviewed.  LABORATORY DATA: Lab Results  Component Value Date   WBC 5.9 07/21/2019   HGB 16.1 07/21/2019   HCT 47.6 07/21/2019   MCV 96.6 07/21/2019   PLT 196 07/21/2019      Chemistry      Component Value Date/Time   NA 140 07/21/2019 1122   NA 141 10/29/2017 1049   K 4.3 07/21/2019 1122   K 3.3 (L) 10/29/2017 1049   CL 105 07/21/2019 1122   CO2 28 07/21/2019 1122   CO2 26 10/29/2017 1049   BUN 7 07/21/2019 1122   BUN 11.2 10/29/2017 1049   CREATININE 0.70 07/21/2019 1122   CREATININE 0.7 10/29/2017 1049    GLU 198 08/25/2017      Component Value Date/Time   CALCIUM 8.9 07/21/2019 1122   CALCIUM 9.0 10/29/2017 1049   ALKPHOS 60 07/21/2019 1122   ALKPHOS 82 10/29/2017 1049   AST 16 07/21/2019 1122   AST 10 10/29/2017 1049   ALT 11 07/21/2019 1122   ALT 8 10/29/2017 1049   BILITOT 0.4 07/21/2019 1122   BILITOT 0.22 10/29/2017 1049       RADIOGRAPHIC STUDIES:  No results found.   ASSESSMENT/PLAN:  This is a very pleasant 53 year old Caucasian male diagnosed with stage III/IV non-small cell lung cancer, squamous cell carcinoma. He presented with a left hilar mass in addition to mediastinal and leftsupraclavicular lymphadenopathyand right hilar lymph adenopathy.He was diagnosed in August 2018. 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 In August of 2018.   He previously underwent concurrent chemoradiation to the lung as well as at theepiglottic area under the care of Dr. Tammi Klippel.Heis status post 6 cycles. He tolerated it well except for radiation-induced esophagitis as well as weight loss and fatigue. He had a partial response to treatment.  He is currently undergoing immunotherapy with Keytruda 200 mg IV every 3 weeks. He is status post 32 cycles. He has been tolerating treatment well without any adverse effects.Labs were reviewed with the patient. I recommend he proceed with cycle #33 today as scheduled.  I will arrange for the patient to have a restaging CT scan of the chest as well as the soft tissue of the neck prior to his next visit.  The patient will have a follow up visit in 3 weeks for evaluation and to review his scan results before starting cycle #34.  The patient was advised to call immediately if he has any concerning symptoms in the interval. The patient voices understanding of current disease status and treatment options and is in agreement with the current care plan. All questions were answered. The patient knows to call  the clinic with any problems, questions or concerns. We can certainly see the patient much sooner if necessary   Orders Placed This Encounter  Procedures  . CT Chest W Contrast    Standing Status:   Future    Standing Expiration Date:   07/20/2020    Order Specific Question:   ** REASON FOR EXAM (FREE TEXT)    Answer:   Restaging Lung Cancer and epiglottic    Order Specific Question:   If indicated for the ordered procedure, I authorize the administration of contrast media per Radiology protocol    Answer:   Yes    Order Specific Question:   Preferred imaging location?    Answer:   [facility]    Order Specific Question:   Radiology  Contrast Protocol - do NOT remove file path    Answer:   [email or web address]  . CT Soft Tissue Neck W Contrast    Standing Status:   Future    Standing Expiration Date:   07/20/2020    Order Specific Question:   ** REASON FOR EXAM (FREE TEXT)    Answer:   Restaging Lung cancer    Order Specific Question:   If indicated for the ordered procedure, I authorize the administration of contrast media per Radiology protocol    Answer:   Yes    Order Specific Question:   Preferred imaging location?    Answer:   [facility]    Order Specific Question:   Radiology Contrast Protocol - do NOT remove file path    Answer:   [email or web address]     Early, PA-C 07/21/19

## 2019-08-08 ENCOUNTER — Encounter (HOSPITAL_COMMUNITY): Payer: Self-pay

## 2019-08-08 ENCOUNTER — Other Ambulatory Visit: Payer: Self-pay

## 2019-08-08 ENCOUNTER — Ambulatory Visit (HOSPITAL_COMMUNITY)
Admission: RE | Admit: 2019-08-08 | Discharge: 2019-08-08 | Disposition: A | Discharge status: Home / Self Care | Payer: 59 | Source: Ambulatory Visit | Attending: Physician Assistant | Admitting: Physician Assistant

## 2019-08-08 DIAGNOSIS — C321 Malignant neoplasm of supraglottis: Secondary | ICD-10-CM

## 2019-08-08 DIAGNOSIS — C3412 Malignant neoplasm of upper lobe, left bronchus or lung: Secondary | ICD-10-CM | POA: Insufficient documentation

## 2019-08-08 MED ORDER — SODIUM CHLORIDE (PF) 0.9 % IJ SOLN
INTRAMUSCULAR | Status: AC
  Filled 2019-08-08: qty 50

## 2019-08-08 MED ORDER — IOHEXOL 300 MG/ML  SOLN
75.0000 mL | Freq: Once | INTRAMUSCULAR | Status: AC | PRN
  Administered 2019-08-08: 75 mL via INTRAVENOUS

## 2019-08-11 ENCOUNTER — Encounter: Payer: Self-pay | Admitting: Internal Medicine

## 2019-08-11 ENCOUNTER — Other Ambulatory Visit: Payer: Self-pay

## 2019-08-11 ENCOUNTER — Inpatient Hospital Stay: Payer: 59 | Attending: Internal Medicine

## 2019-08-11 ENCOUNTER — Inpatient Hospital Stay (HOSPITAL_BASED_OUTPATIENT_CLINIC_OR_DEPARTMENT_OTHER): Payer: 59 | Admitting: Internal Medicine

## 2019-08-11 ENCOUNTER — Inpatient Hospital Stay: Payer: 59

## 2019-08-11 VITALS — BP 123/78 | HR 79 | Temp 98.7°F | Resp 18 | Ht 71.0 in | Wt 153.7 lb

## 2019-08-11 DIAGNOSIS — I10 Essential (primary) hypertension: Secondary | ICD-10-CM

## 2019-08-11 DIAGNOSIS — C3412 Malignant neoplasm of upper lobe, left bronchus or lung: Secondary | ICD-10-CM

## 2019-08-11 DIAGNOSIS — Z5112 Encounter for antineoplastic immunotherapy: Secondary | ICD-10-CM | POA: Insufficient documentation

## 2019-08-11 DIAGNOSIS — Z7951 Long term (current) use of inhaled steroids: Secondary | ICD-10-CM | POA: Insufficient documentation

## 2019-08-11 DIAGNOSIS — R911 Solitary pulmonary nodule: Secondary | ICD-10-CM | POA: Diagnosis not present

## 2019-08-11 DIAGNOSIS — J449 Chronic obstructive pulmonary disease, unspecified: Secondary | ICD-10-CM | POA: Insufficient documentation

## 2019-08-11 DIAGNOSIS — Z923 Personal history of irradiation: Secondary | ICD-10-CM | POA: Insufficient documentation

## 2019-08-11 DIAGNOSIS — C321 Malignant neoplasm of supraglottis: Secondary | ICD-10-CM | POA: Insufficient documentation

## 2019-08-11 DIAGNOSIS — F329 Major depressive disorder, single episode, unspecified: Secondary | ICD-10-CM

## 2019-08-11 DIAGNOSIS — Z79899 Other long term (current) drug therapy: Secondary | ICD-10-CM | POA: Diagnosis not present

## 2019-08-11 DIAGNOSIS — Z86010 Personal history of colonic polyps: Secondary | ICD-10-CM | POA: Insufficient documentation

## 2019-08-11 DIAGNOSIS — F419 Anxiety disorder, unspecified: Secondary | ICD-10-CM

## 2019-08-11 DIAGNOSIS — R918 Other nonspecific abnormal finding of lung field: Secondary | ICD-10-CM

## 2019-08-11 DIAGNOSIS — R5382 Chronic fatigue, unspecified: Secondary | ICD-10-CM

## 2019-08-11 DIAGNOSIS — F32A Depression, unspecified: Secondary | ICD-10-CM

## 2019-08-11 DIAGNOSIS — Z9221 Personal history of antineoplastic chemotherapy: Secondary | ICD-10-CM | POA: Insufficient documentation

## 2019-08-11 DIAGNOSIS — K219 Gastro-esophageal reflux disease without esophagitis: Secondary | ICD-10-CM | POA: Diagnosis not present

## 2019-08-11 DIAGNOSIS — Z95828 Presence of other vascular implants and grafts: Secondary | ICD-10-CM

## 2019-08-11 LAB — CBC WITH DIFFERENTIAL (CANCER CENTER ONLY)
Abs Immature Granulocytes: 0.02 10*3/uL (ref 0.00–0.07)
Basophils Absolute: 0 10*3/uL (ref 0.0–0.1)
Basophils Relative: 1 %
Eosinophils Absolute: 0 10*3/uL (ref 0.0–0.5)
Eosinophils Relative: 0 %
HCT: 46.8 % (ref 39.0–52.0)
Hemoglobin: 16.2 g/dL (ref 13.0–17.0)
Immature Granulocytes: 0 %
Lymphocytes Relative: 8 %
Lymphs Abs: 0.5 10*3/uL — ABNORMAL LOW (ref 0.7–4.0)
MCH: 32.7 pg (ref 26.0–34.0)
MCHC: 34.6 g/dL (ref 30.0–36.0)
MCV: 94.5 fL (ref 80.0–100.0)
Monocytes Absolute: 0.5 10*3/uL (ref 0.1–1.0)
Monocytes Relative: 9 %
Neutro Abs: 4.8 10*3/uL (ref 1.7–7.7)
Neutrophils Relative %: 82 %
Platelet Count: 204 10*3/uL (ref 150–400)
RBC: 4.95 MIL/uL (ref 4.22–5.81)
RDW: 11.8 % (ref 11.5–15.5)
WBC Count: 5.8 10*3/uL (ref 4.0–10.5)
nRBC: 0 % (ref 0.0–0.2)

## 2019-08-11 LAB — CMP (CANCER CENTER ONLY)
ALT: 11 U/L (ref 0–44)
AST: 17 U/L (ref 15–41)
Albumin: 4.1 g/dL (ref 3.5–5.0)
Alkaline Phosphatase: 65 U/L (ref 38–126)
Anion gap: 9 (ref 5–15)
BUN: 8 mg/dL (ref 6–20)
CO2: 26 mmol/L (ref 22–32)
Calcium: 9.1 mg/dL (ref 8.9–10.3)
Chloride: 104 mmol/L (ref 98–111)
Creatinine: 0.68 mg/dL (ref 0.61–1.24)
GFR, Est AFR Am: >60 mL/min (ref >60)
GFR, Estimated: >60 mL/min (ref >60)
Glucose, Bld: 104 mg/dL — ABNORMAL HIGH (ref 70–99)
Potassium: 4.4 mmol/L (ref 3.5–5.1)
Sodium: 139 mmol/L (ref 135–145)
Total Bilirubin: 0.4 mg/dL (ref 0.3–1.2)
Total Protein: 6.6 g/dL (ref 6.5–8.1)

## 2019-08-11 LAB — TSH: TSH: 2.599 u[IU]/mL (ref 0.320–4.118)

## 2019-08-11 MED ORDER — SODIUM CHLORIDE 0.9% FLUSH
10.0000 mL | Freq: Once | INTRAVENOUS | Status: AC
  Administered 2019-08-11: 10 mL
  Filled 2019-08-11: qty 10

## 2019-08-11 MED ORDER — SODIUM CHLORIDE 0.9% FLUSH
10.0000 mL | INTRAVENOUS | Status: DC | PRN
  Administered 2019-08-11: 10 mL
  Filled 2019-08-11: qty 10

## 2019-08-11 MED ORDER — HEPARIN SOD (PORK) LOCK FLUSH 100 UNIT/ML IV SOLN
500.0000 [IU] | Freq: Once | INTRAVENOUS | Status: AC | PRN
  Administered 2019-08-11: 500 [IU]
  Filled 2019-08-11: qty 5

## 2019-08-11 MED ORDER — SODIUM CHLORIDE 0.9 % IV SOLN
200.0000 mg | Freq: Once | INTRAVENOUS | Status: AC
  Administered 2019-08-11: 200 mg via INTRAVENOUS
  Filled 2019-08-11: qty 8

## 2019-08-11 MED ORDER — SODIUM CHLORIDE 0.9 % IV SOLN
Freq: Once | INTRAVENOUS | Status: AC
  Administered 2019-08-11: 13:00:00 via INTRAVENOUS
  Filled 2019-08-11: qty 250

## 2019-08-11 NOTE — Progress Notes (Signed)
[facility] [phone]   Fax:([phone]  OFFICE PROGRESS NOTE  [name], [name], MD [place] [address]  DIAGNOSIS:  1) Stage IIB/IV (T3, N3, M1a) non-small cell lung cancer, squamous cell carcinoma presented with large left hilar mass in addition to mediastinal and left supraclavicular lymphadenopathy as well as contralateral right upper lobe nodule diagnosed in August 2018. PDL 1 expression: 90%. 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 First dose 06/29/2017. Status post 6 cycles. Last dose was given 08/03/2017.  CURRENT THERAPY: Immunotherapy with Ketruda 200 MG IV every 3 weeks, first dose 09/17/2017.  Status post 33 cycles.  INTERVAL HISTORY: Trevor Zavala 53 y.o. male returns to the clinic today for follow-up visit.  The patient is feeling fine today with no concerning complaints.  He has no recent chest pain, shortness of breath, cough or hemoptysis.  He has no nausea, vomiting, diarrhea, constipation or abdominal pain.  He has no fever or chills.  He denied having any recent weight loss or night sweats.  He has been tolerating his treatment with Imfinzi fairly well.  The patient is here today for evaluation after repeating CT scan of the chest.  His insurance declined approval of the scan of the neck.  MEDICAL HISTORY: Past Medical History:  Diagnosis Date   Anxiety    COPD (chronic obstructive pulmonary disease) (HCC)    GERD (gastroesophageal reflux disease)    Headache    Hypertension    Pneumonia    Situational depression    Stage III squamous cell carcinoma of left lung ([facility]) 06/19/2017   Lungs & Epiglottis   Tubular adenoma of colon 01/2015   Tubular adenoma of colon 01/2015    ALLERGIES:  is allergic to escitalopram oxalate.  MEDICATIONS:  Current Outpatient  Medications  Medication Sig Dispense Refill   acyclovir (ZOVIRAX) 400 MG tablet Take 1 tablet (400 mg total) by mouth 3 (three) times daily. 21 tablet 3   clonazePAM (KLONOPIN) 0.5 MG tablet TAKE 1 TO 2 TABLETS        (0.5-1MG  TOTAL) TWICE A DAYAS NEEDED FOR ANXIETY 180 tablet 1   diphenhydrAMINE (BENADRYL) 25 MG tablet Take 12.5 mg by mouth every 6 (six) hours as needed for itching.     esomeprazole (NEXIUM) 40 MG capsule 1 po qam 90 capsule 3   fexofenadine-pseudoephedrine (ALLEGRA-D 24) 180-240 MG 24 hr tablet Take 1 tablet by mouth daily.     fluticasone furoate-vilanterol (BREO ELLIPTA) 100-25 MCG/INH AEPB INHALE 1 PUFF INTO THE LUNGS DAILY. 3 each 3   Multiple Vitamin (MULTIVITAMIN WITH MINERALS) TABS tablet Take 1 tablet by mouth daily.     pembrolizumab (KEYTRUDA) 100 MG/4ML SOLN Inject 2 mg/kg into the vein. 200mg  every 3 weeks.     No current facility-administered medications for this visit.     SURGICAL HISTORY:  Past Surgical History:  Procedure Laterality Date   COLONOSCOPY     DIRECT LARYNGOSCOPY N/A 06/25/2017   Procedure: DIRECT LARYNGOSCOPY AND BIOPSY;  Surgeon: Rozetta Nunnery, MD;  Location: [facility];  Service: ENT;  Laterality: N/A;   EAR CYST EXCISION N/A 11/17/2013   Procedure: SEBACEOUS CYST CHEST;  Surgeon: Joyice Faster. Cornett, MD;  Location: [facility];  Service: General;  Laterality: N/A;   IR GASTROSTOMY TUBE MOD SED  08/10/2017  IR GASTROSTOMY TUBE REMOVAL  09/29/2017   KNEE ARTHROSCOPY     LEFT   LIPOMA EXCISION N/A 11/17/2013   Procedure: EXCISION LIPOMA FOREHEAD;  Surgeon: Joyice Faster. Cornett, MD;  Location: [facility];  Service: General;  Laterality: N/A;   LUNG BIOPSY Bilateral 06/12/2017   Procedure: LEFT LUNG BIOPSY;  Surgeon: Grace Isaac, MD;  Location: [facility];  Service: Thoracic;  Laterality: Bilateral;   MICROLARYNGOSCOPY W/VOCAL CORD INJECTION Left 07/16/2018   Procedure: suspened  micro laryngoscopy with jet ventilation and prolaryn injection;  Surgeon: Melida Quitter, MD;  Location: [facility];  Service: ENT;  Laterality: Left;   PORTACATH PLACEMENT Right 07/01/2017   Procedure: INSERTION PORT-A-CATH - RIGHT IJ - placed with Fluoro and Ultrasound;  Surgeon: Grace Isaac, MD;  Location: [facility];  Service: Thoracic;  Laterality: Right;   VIDEO BRONCHOSCOPY WITH ENDOBRONCHIAL ULTRASOUND N/A 06/12/2017   Procedure: VIDEO BRONCHOSCOPY WITH ENDOBRONCHIAL ULTRASOUND;  Surgeon: Grace Isaac, MD;  Location: [facility];  Service: Thoracic;  Laterality: N/A;    REVIEW OF SYSTEMS:  Constitutional: negative Eyes: negative Ears, nose, mouth, throat, and face: negative Respiratory: negative Cardiovascular: negative Gastrointestinal: negative Genitourinary:negative Integument/breast: negative Hematologic/lymphatic: negative Musculoskeletal:negative Neurological: negative Behavioral/Psych: negative Endocrine: negative Allergic/Immunologic: negative   PHYSICAL EXAMINATION: General appearance: alert, cooperative and no distress Head: Normocephalic, without obvious abnormality, atraumatic Neck: no adenopathy, no JVD, supple, symmetrical, trachea midline and thyroid not enlarged, symmetric, no tenderness/mass/nodules Lymph nodes: Cervical, supraclavicular, and axillary nodes normal. Resp: clear to auscultation bilaterally Back: symmetric, no curvature. ROM normal. No CVA tenderness. Cardio: regular rate and rhythm, S1, S2 normal, no murmur, click, rub or gallop GI: soft, non-tender; bowel sounds normal; no masses,  no organomegaly Extremities: extremities normal, atraumatic, no cyanosis or edema Neurologic: Alert and oriented X 3, normal strength and tone. Normal symmetric reflexes. Normal coordination and gait  ECOG PERFORMANCE STATUS: 0 - Asymptomatic  Blood pressure 123/78, pulse 79, temperature 98.7 F (37.1 C), temperature source Temporal, resp. rate 18, height 5\' 11"   (1.803 m), weight 153 lb 11.2 oz (69.7 kg), SpO2 97 %.  LABORATORY DATA: Lab Results  Component Value Date   WBC 5.8 08/11/2019   HGB 16.2 08/11/2019   HCT 46.8 08/11/2019   MCV 94.5 08/11/2019   PLT 204 08/11/2019      Chemistry      Component Value Date/Time   NA 140 07/21/2019 1122   NA 141 10/29/2017 1049   K 4.3 07/21/2019 1122   K 3.3 (L) 10/29/2017 1049   CL 105 07/21/2019 1122   CO2 28 07/21/2019 1122   CO2 26 10/29/2017 1049   BUN 7 07/21/2019 1122   BUN 11.2 10/29/2017 1049   CREATININE 0.70 07/21/2019 1122   CREATININE 0.7 10/29/2017 1049   GLU 198 08/25/2017      Component Value Date/Time   CALCIUM 8.9 07/21/2019 1122   CALCIUM 9.0 10/29/2017 1049   ALKPHOS 60 07/21/2019 1122   ALKPHOS 82 10/29/2017 1049   AST 16 07/21/2019 1122   AST 10 10/29/2017 1049   ALT 11 07/21/2019 1122   ALT 8 10/29/2017 1049   BILITOT 0.4 07/21/2019 1122   BILITOT 0.22 10/29/2017 1049       RADIOGRAPHIC STUDIES: Ct Chest W Contrast  Result Date: 08/08/2019 CLINICAL DATA:  Restage lung and epiglottic cancer, chemo XRT completed, immunotherapy EXAM: CT CHEST WITH CONTRAST TECHNIQUE: Multidetector CT imaging of the chest was performed during intravenous contrast administration. CONTRAST:  48mL OMNIPAQUE IOHEXOL  300 MG/ML  SOLN COMPARISON:  05/17/2019, 02/22/2019 FINDINGS: Cardiovascular: Mild aortic atherosclerosis. Right chest port catheter. Normal heart size. No pericardial effusion. Mediastinum/Nodes: No enlarged mediastinal, hilar, or axillary lymph nodes. Thyroid gland, trachea, and esophagus demonstrate no significant findings. Lungs/Pleura: Mild paraseptal emphysema. No significant change in bandlike fibrotic scarring and volume loss about the hila, left greater than right, with post treatment appearance of soft tissue about the left hilum. There has been interval enlargement of an irregular nodule of the left upper lobe measuring 1.6 x 1.0 cm, previously 1.0 x 0.8 cm (series 5,  image 37), with a new adjacent nodule measuring 1.3 x 0.8 cm (series 5, image 35). Slight interval reduction in likely loculated posterior pleural fluid about the left hilum. Upper Abdomen: No acute abnormality. Stable subcentimeter nodule of the lateral limb of the left adrenal gland (series 2, image 163). Musculoskeletal: No chest wall mass or suspicious bone lesions identified. IMPRESSION: 1. No significant change in bandlike post radiation fibrotic scarring and volume loss about the hila, left greater than right, with post treatment appearance of soft tissue about the left hilum. 2. There has been continued interval enlargement of an irregular nodule of the left upper lobe measuring 1.6 x 1.0 cm, previously 1.0 x 0.8 cm (series 5, image 37), with a new adjacent nodule measuring 1.3 x 0.8 cm (series 5, image 35). Findings are concerning for recurrent/metastatic disease. Given size, these could be assessed for metabolic activity by PET-CT. Tissue sampling may be considered. 3.  Emphysema (ICD10-J43.9). 4.  Aortic Atherosclerosis (ICD10-I70.0). Electronically Signed   By: Eddie Candle M.D.   On: 08/08/2019 08:36    ASSESSMENT AND PLAN: This is a very pleasant 53 years old white male with recently diagnosed stage IIIB/IV non-small cell lung cancer, adenocarcinoma presented with large left hilar mass in addition to mediastinal and left supraclavicular lymphadenopathy as well as suspicious right upper lobe pulmonary nodule diagnosed in August 2018. 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 He underwent a course of concurrent chemoradiation to the lung as well as the epiglottic area under the care of Dr. Tammi Klippel. He is status post 6 cycle. He tolerated this course of treatment well except for the radiation induced esophagitis as well as weight loss and fatigue. The patient had partial response to the previous treatment. He is currently on treatment with Keytruda 200  mg IV every 3 weeks status post 33 cycles. The patient has been tolerating this treatment well with no concerning adverse effects. He had repeat CT scan of the chest performed recently.  I personally and independently reviewed the scan images and discussed the results with the patient today. His scan showed stable disease except for enlarging and the new adjacent pulmonary nodule in the left upper lobe concerning for disease recurrence/progression. I recommended for the patient to have a PET scan performed in the next 2 weeks for further evaluation of these lesions.  If the lesions are hypermetabolic on the PET scan, we will send him to radiation oncology for consideration of SBRT to the new pulmonary nodules. He will continue his current treatment with Keytruda as planned and he will proceed with cycle #34 today. We will also continue to monitor his thyroid function closely during his treatment. He will come back for follow-up visit in 3 weeks for evaluation before the next cycle of his treatment. The patient was advised to call immediately if he has any concerning symptoms in the interval. The  patient voices understanding of current disease status and treatment options and is in agreement with the current care plan. All questions were answered. The patient knows to call the clinic with any problems, questions or concerns. We can certainly see the patient much sooner if necessary. Disclaimer: This note was dictated with voice recognition software. Similar sounding words can inadvertently be transcribed and may not be corrected upon review.

## 2019-08-11 NOTE — Patient Instructions (Signed)
[facility] Discharge Instructions for Patients Receiving Chemotherapy  Today you received the following chemotherapy agents: Pembrolizumab [facility]).  To help prevent nausea and vomiting after your treatment, we encourage you to take your nausea medication as directed.   If you develop nausea and vomiting that is not controlled by your nausea medication, call the clinic.   BELOW ARE SYMPTOMS THAT SHOULD BE REPORTED IMMEDIATELY:  *FEVER GREATER THAN 100.5 F  *CHILLS WITH OR WITHOUT FEVER  NAUSEA AND VOMITING THAT IS NOT CONTROLLED WITH YOUR NAUSEA MEDICATION  *UNUSUAL SHORTNESS OF BREATH  *UNUSUAL BRUISING OR BLEEDING  TENDERNESS IN MOUTH AND THROAT WITH OR WITHOUT PRESENCE OF ULCERS  *URINARY PROBLEMS  *BOWEL PROBLEMS  UNUSUAL RASH Items with * indicate a potential emergency and should be followed up as soon as possible.  Feel free to call the clinic should you have any questions or concerns. The clinic phone number is [phone].  Please show the [facility] at check-in to the Emergency Department and triage nurse.  Coronavirus (COVID-19) Are you at risk?  Are you at risk for the Coronavirus (COVID-19)?  To be considered HIGH RISK for Coronavirus (COVID-19), you have to meet the following criteria:  . Traveled to [place], [place], [place], [place] or [place]; or in the [place] to [place], [place], [place], or [place]; and have fever, cough, and shortness of breath within the last 2 weeks of travel OR . Been in close contact with a person diagnosed with COVID-19 within the last 2 weeks and have fever, cough, and shortness of breath . IF YOU DO NOT MEET THESE CRITERIA, YOU ARE CONSIDERED LOW RISK FOR COVID-19.  What to do if you are HIGH RISK for COVID-19?  Marland Kitchen If you are having a medical emergency, call 911. . Seek medical care right away. Before you go to a doctor's office, urgent care or emergency department, call ahead and tell them  about your recent travel, contact with someone diagnosed with COVID-19, and your symptoms. You should receive instructions from your physician's office regarding next steps of care.  . When you arrive at healthcare provider, tell the healthcare staff immediately you have returned from visiting [place], [place], [place], [place] or [place]; or traveled in the [place] to [place], [place], [place], or [place]; in the last two weeks or you have been in close contact with a person diagnosed with COVID-19 in the last 2 weeks.   . Tell the health care staff about your symptoms: fever, cough and shortness of breath. . After you have been seen by a medical provider, you will be either: o Tested for (COVID-19) and discharged home on quarantine except to seek medical care if symptoms worsen, and asked to  - Stay home and avoid contact with others until you get your results (4-5 days)  - Avoid travel on public transportation if possible (such as bus, train, or airplane) or o Sent to the Emergency Department by EMS for evaluation, COVID-19 testing, and possible admission depending on your condition and test results.  What to do if you are LOW RISK for COVID-19?  Reduce your risk of any infection by using the same precautions used for avoiding the common cold or flu:  Marland Kitchen Wash your hands often with soap and warm water for at least 20 seconds.  If soap and water are not readily available, use an alcohol-based hand sanitizer with at least 60% alcohol.  . If coughing or  sneezing, cover your mouth and nose by coughing or sneezing into the elbow areas of your shirt or coat, into a tissue or into your sleeve (not your hands). . Avoid shaking hands with others and consider head nods or verbal greetings only. . Avoid touching your eyes, nose, or mouth with unwashed hands.  . Avoid close contact with people who are sick. . Avoid places or events with large numbers of people in one location, like concerts or  sporting events. . Carefully consider travel plans you have or are making. . If you are planning any travel outside or inside the [place], visit the CDC's Travelers' Health webpage for the latest health notices. . If you have some symptoms but not all symptoms, continue to monitor at home and seek medical attention if your symptoms worsen. . If you are having a medical emergency, call 911.   [facility] / e-Visit: [email or web address]         [facility]: [facility]: [phone]                   [facility]: [phone]

## 2019-08-12 ENCOUNTER — Telehealth: Payer: Self-pay | Admitting: Internal Medicine

## 2019-08-12 NOTE — Telephone Encounter (Signed)
Scheduled appt per 10/1 los - pt to get an updated schedule

## 2019-08-29 ENCOUNTER — Ambulatory Visit (HOSPITAL_COMMUNITY)
Admission: RE | Admit: 2019-08-29 | Discharge: 2019-08-29 | Disposition: A | Discharge status: Home / Self Care | Payer: 59 | Source: Ambulatory Visit | Attending: Internal Medicine | Admitting: Internal Medicine

## 2019-08-29 ENCOUNTER — Other Ambulatory Visit: Payer: Self-pay

## 2019-08-29 DIAGNOSIS — R911 Solitary pulmonary nodule: Secondary | ICD-10-CM | POA: Insufficient documentation

## 2019-08-29 LAB — GLUCOSE, CAPILLARY: Glucose-Capillary: 90 mg/dL (ref 70–99)

## 2019-08-29 MED ORDER — FLUDEOXYGLUCOSE F - 18 (FDG) INJECTION
8.2000 | Freq: Once | INTRAVENOUS | Status: AC | PRN
  Administered 2019-08-29: 8.2 via INTRAVENOUS

## 2019-08-31 ENCOUNTER — Other Ambulatory Visit: Payer: Self-pay | Admitting: Medical Oncology

## 2019-08-31 DIAGNOSIS — C321 Malignant neoplasm of supraglottis: Secondary | ICD-10-CM

## 2019-09-01 ENCOUNTER — Inpatient Hospital Stay: Payer: 59

## 2019-09-01 ENCOUNTER — Inpatient Hospital Stay (HOSPITAL_BASED_OUTPATIENT_CLINIC_OR_DEPARTMENT_OTHER): Payer: 59 | Admitting: Internal Medicine

## 2019-09-01 ENCOUNTER — Other Ambulatory Visit: Payer: Self-pay

## 2019-09-01 ENCOUNTER — Encounter: Payer: Self-pay | Admitting: Internal Medicine

## 2019-09-01 VITALS — BP 119/84 | HR 77 | Temp 97.8°F | Resp 17 | Ht 71.0 in | Wt 154.1 lb

## 2019-09-01 DIAGNOSIS — F329 Major depressive disorder, single episode, unspecified: Secondary | ICD-10-CM

## 2019-09-01 DIAGNOSIS — F419 Anxiety disorder, unspecified: Secondary | ICD-10-CM | POA: Diagnosis not present

## 2019-09-01 DIAGNOSIS — C3412 Malignant neoplasm of upper lobe, left bronchus or lung: Secondary | ICD-10-CM

## 2019-09-01 DIAGNOSIS — C321 Malignant neoplasm of supraglottis: Secondary | ICD-10-CM

## 2019-09-01 DIAGNOSIS — Z5112 Encounter for antineoplastic immunotherapy: Secondary | ICD-10-CM

## 2019-09-01 DIAGNOSIS — Z95828 Presence of other vascular implants and grafts: Secondary | ICD-10-CM

## 2019-09-01 DIAGNOSIS — C3490 Malignant neoplasm of unspecified part of unspecified bronchus or lung: Secondary | ICD-10-CM

## 2019-09-01 LAB — CBC WITH DIFFERENTIAL (CANCER CENTER ONLY)
Abs Immature Granulocytes: 0.03 10*3/uL (ref 0.00–0.07)
Basophils Absolute: 0 10*3/uL (ref 0.0–0.1)
Basophils Relative: 1 %
Eosinophils Absolute: 0 10*3/uL (ref 0.0–0.5)
Eosinophils Relative: 1 %
HCT: 46.3 % (ref 39.0–52.0)
Hemoglobin: 15.8 g/dL (ref 13.0–17.0)
Immature Granulocytes: 1 %
Lymphocytes Relative: 7 %
Lymphs Abs: 0.4 10*3/uL — ABNORMAL LOW (ref 0.7–4.0)
MCH: 33.4 pg (ref 26.0–34.0)
MCHC: 34.1 g/dL (ref 30.0–36.0)
MCV: 97.9 fL (ref 80.0–100.0)
Monocytes Absolute: 0.4 10*3/uL (ref 0.1–1.0)
Monocytes Relative: 7 %
Neutro Abs: 5 10*3/uL (ref 1.7–7.7)
Neutrophils Relative %: 83 %
Platelet Count: 198 10*3/uL (ref 150–400)
RBC: 4.73 MIL/uL (ref 4.22–5.81)
RDW: 12 % (ref 11.5–15.5)
WBC Count: 5.9 10*3/uL (ref 4.0–10.5)
nRBC: 0 % (ref 0.0–0.2)

## 2019-09-01 LAB — CMP (CANCER CENTER ONLY)
ALT: 13 U/L (ref 0–44)
AST: 16 U/L (ref 15–41)
Albumin: 3.9 g/dL (ref 3.5–5.0)
Alkaline Phosphatase: 63 U/L (ref 38–126)
Anion gap: 9 (ref 5–15)
BUN: 11 mg/dL (ref 6–20)
CO2: 25 mmol/L (ref 22–32)
Calcium: 8.8 mg/dL — ABNORMAL LOW (ref 8.9–10.3)
Chloride: 107 mmol/L (ref 98–111)
Creatinine: 0.72 mg/dL (ref 0.61–1.24)
GFR, Est AFR Am: >60 mL/min (ref >60)
GFR, Estimated: >60 mL/min (ref >60)
Glucose, Bld: 124 mg/dL — ABNORMAL HIGH (ref 70–99)
Potassium: 4.2 mmol/L (ref 3.5–5.1)
Sodium: 141 mmol/L (ref 135–145)
Total Bilirubin: 0.5 mg/dL (ref 0.3–1.2)
Total Protein: 6.5 g/dL (ref 6.5–8.1)

## 2019-09-01 LAB — TSH: TSH: 2.175 u[IU]/mL (ref 0.320–4.118)

## 2019-09-01 MED ORDER — HEPARIN SOD (PORK) LOCK FLUSH 100 UNIT/ML IV SOLN
500.0000 [IU] | Freq: Once | INTRAVENOUS | Status: AC | PRN
  Administered 2019-09-01: 14:00:00 500 [IU]
  Filled 2019-09-01: qty 5

## 2019-09-01 MED ORDER — SODIUM CHLORIDE 0.9 % IV SOLN
200.0000 mg | Freq: Once | INTRAVENOUS | Status: AC
  Administered 2019-09-01: 200 mg via INTRAVENOUS
  Filled 2019-09-01: qty 8

## 2019-09-01 MED ORDER — SODIUM CHLORIDE 0.9% FLUSH
10.0000 mL | Freq: Once | INTRAVENOUS | Status: AC
  Administered 2019-09-01: 10 mL
  Filled 2019-09-01: qty 10

## 2019-09-01 MED ORDER — SODIUM CHLORIDE 0.9% FLUSH
10.0000 mL | INTRAVENOUS | Status: DC | PRN
  Administered 2019-09-01: 10 mL
  Filled 2019-09-01: qty 10

## 2019-09-01 MED ORDER — SODIUM CHLORIDE 0.9 % IV SOLN
Freq: Once | INTRAVENOUS | Status: AC
  Administered 2019-09-01: 13:00:00 via INTRAVENOUS
  Filled 2019-09-01: qty 250

## 2019-09-01 NOTE — Progress Notes (Signed)
[facility] [phone]   [phone]  OFFICE PROGRESS NOTE  [name], [name], MD [place] [address]  DIAGNOSIS:  1) Stage IIB/IV (T3, N3, M1a) non-small cell lung cancer, squamous cell carcinoma presented with large left hilar mass in addition to mediastinal and left supraclavicular lymphadenopathy as well as contralateral right upper lobe nodule diagnosed in August 2018. PDL 1 expression: 90%. 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 First dose 06/29/2017. Status post 6 cycles. Last dose was given 08/03/2017.  CURRENT THERAPY: Immunotherapy with Ketruda 200 MG IV every 3 weeks, first dose 09/17/2017.  Status post 34 cycles.  INTERVAL HISTORY: Trevor Zavala 53 y.o. male returns to the clinic today for follow-up visit.  The patient is feeling fine today with no concerning complaints.  He denied having any current chest pain, shortness of breath, cough or hemoptysis.  He denied having any fever or chills.  He has no nausea, vomiting, diarrhea or constipation.  He has no headache or visual changes.  He was found on previous CT scan of the chest to have a suspicious lesion in the left lung.  The patient had a PET scan performed recently and he is here for evaluation and discussion of his PET scan results before the last cycle of his treatment with immunotherapy.  MEDICAL HISTORY: Past Medical History:  Diagnosis Date   Anxiety    COPD (chronic obstructive pulmonary disease) (HCC)    GERD (gastroesophageal reflux disease)    Headache    Hypertension    Pneumonia    Situational depression    Stage III squamous cell carcinoma of left lung ([facility]) 06/19/2017   Lungs & Epiglottis   Tubular adenoma of colon 01/2015   Tubular adenoma of colon 01/2015    ALLERGIES:  is allergic to escitalopram  oxalate.  MEDICATIONS:  Current Outpatient Medications  Medication Sig Dispense Refill   acyclovir (ZOVIRAX) 400 MG tablet Take 1 tablet (400 mg total) by mouth 3 (three) times daily. 21 tablet 3   clonazePAM (KLONOPIN) 0.5 MG tablet TAKE 1 TO 2 TABLETS        (0.5-1MG  TOTAL) TWICE A DAYAS NEEDED FOR ANXIETY 180 tablet 1   diphenhydrAMINE (BENADRYL) 25 MG tablet Take 12.5 mg by mouth every 6 (six) hours as needed for itching.     esomeprazole (NEXIUM) 40 MG capsule 1 po qam 90 capsule 3   fexofenadine-pseudoephedrine (ALLEGRA-D 24) 180-240 MG 24 hr tablet Take 1 tablet by mouth daily.     fluticasone furoate-vilanterol (BREO ELLIPTA) 100-25 MCG/INH AEPB INHALE 1 PUFF INTO THE LUNGS DAILY. 3 each 3   Multiple Vitamin (MULTIVITAMIN WITH MINERALS) TABS tablet Take 1 tablet by mouth daily.     pembrolizumab (KEYTRUDA) 100 MG/4ML SOLN Inject 2 mg/kg into the vein. 200mg  every 3 weeks.     No current facility-administered medications for this visit.     SURGICAL HISTORY:  Past Surgical History:  Procedure Laterality Date   COLONOSCOPY     DIRECT LARYNGOSCOPY N/A 06/25/2017   Procedure: DIRECT LARYNGOSCOPY AND BIOPSY;  Surgeon: Rozetta Nunnery, MD;  Location: [facility];  Service: ENT;  Laterality: N/A;   EAR CYST EXCISION N/A 11/17/2013   Procedure: SEBACEOUS CYST CHEST;  Surgeon: Joyice Faster. Cornett, MD;  Location: [facility];  Service: General;  Laterality: N/A;   IR GASTROSTOMY TUBE MOD SED  08/10/2017   IR GASTROSTOMY TUBE REMOVAL  09/29/2017   KNEE ARTHROSCOPY     LEFT   LIPOMA EXCISION N/A 11/17/2013   Procedure: EXCISION LIPOMA FOREHEAD;  Surgeon: Joyice Faster. Cornett, MD;  Location: [facility];  Service: General;  Laterality: N/A;   LUNG BIOPSY Bilateral 06/12/2017   Procedure: LEFT LUNG BIOPSY;  Surgeon: Grace Isaac, MD;  Location: [employer];  Service: Thoracic;  Laterality: Bilateral;   MICROLARYNGOSCOPY W/VOCAL CORD  INJECTION Left 07/16/2018   Procedure: suspened micro laryngoscopy with jet ventilation and prolaryn injection;  Surgeon: Melida Quitter, MD;  Location: [facility];  Service: ENT;  Laterality: Left;   PORTACATH PLACEMENT Right 07/01/2017   Procedure: INSERTION PORT-A-CATH - RIGHT IJ - placed with Fluoro and Ultrasound;  Surgeon: Grace Isaac, MD;  Location: [facility];  Service: Thoracic;  Laterality: Right;   VIDEO BRONCHOSCOPY WITH ENDOBRONCHIAL ULTRASOUND N/A 06/12/2017   Procedure: VIDEO BRONCHOSCOPY WITH ENDOBRONCHIAL ULTRASOUND;  Surgeon: Grace Isaac, MD;  Location: [facility];  Service: Thoracic;  Laterality: N/A;    REVIEW OF SYSTEMS:  Constitutional: negative Eyes: negative Ears, nose, mouth, throat, and face: negative Respiratory: negative Cardiovascular: negative Gastrointestinal: negative Genitourinary:negative Integument/breast: negative Hematologic/lymphatic: negative Musculoskeletal:negative Neurological: negative Behavioral/Psych: positive for anxiety Endocrine: negative Allergic/Immunologic: negative   PHYSICAL EXAMINATION: General appearance: alert, cooperative and no distress Head: Normocephalic, without obvious abnormality, atraumatic Neck: no adenopathy, no JVD, supple, symmetrical, trachea midline and thyroid not enlarged, symmetric, no tenderness/mass/nodules Lymph nodes: Cervical, supraclavicular, and axillary nodes normal. Resp: clear to auscultation bilaterally Back: symmetric, no curvature. ROM normal. No CVA tenderness. Cardio: regular rate and rhythm, S1, S2 normal, no murmur, click, rub or gallop GI: soft, non-tender; bowel sounds normal; no masses,  no organomegaly Extremities: extremities normal, atraumatic, no cyanosis or edema Neurologic: Alert and oriented X 3, normal strength and tone. Normal symmetric reflexes. Normal coordination and gait  ECOG PERFORMANCE STATUS: 0 - Asymptomatic  Blood pressure 119/84, pulse 77, temperature 97.8 F (36.6 C),  temperature source Oral, resp. rate 17, height 5\' 11"  (1.803 m), weight 154 lb 1.6 oz (69.9 kg), SpO2 99 %.  LABORATORY DATA: Lab Results  Component Value Date   WBC 5.8 08/11/2019   HGB 16.2 08/11/2019   HCT 46.8 08/11/2019   MCV 94.5 08/11/2019   PLT 204 08/11/2019      Chemistry      Component Value Date/Time   NA 139 08/11/2019 1132   NA 141 10/29/2017 1049   K 4.4 08/11/2019 1132   K 3.3 (L) 10/29/2017 1049   CL 104 08/11/2019 1132   CO2 26 08/11/2019 1132   CO2 26 10/29/2017 1049   BUN 8 08/11/2019 1132   BUN 11.2 10/29/2017 1049   CREATININE 0.68 08/11/2019 1132   CREATININE 0.7 10/29/2017 1049   GLU 198 08/25/2017      Component Value Date/Time   CALCIUM 9.1 08/11/2019 1132   CALCIUM 9.0 10/29/2017 1049   ALKPHOS 65 08/11/2019 1132   ALKPHOS 82 10/29/2017 1049   AST 17 08/11/2019 1132   AST 10 10/29/2017 1049   ALT 11 08/11/2019 1132   ALT 8 10/29/2017 1049   BILITOT 0.4 08/11/2019 1132   BILITOT 0.22 10/29/2017 1049       RADIOGRAPHIC STUDIES: Ct Chest W Contrast  Result Date: 08/08/2019 CLINICAL DATA:  Restage lung and epiglottic cancer, chemo XRT completed, immunotherapy EXAM: CT CHEST WITH CONTRAST TECHNIQUE: Multidetector CT  imaging of the chest was performed during intravenous contrast administration. CONTRAST:  43mL OMNIPAQUE IOHEXOL 300 MG/ML  SOLN COMPARISON:  05/17/2019, 02/22/2019 FINDINGS: Cardiovascular: Mild aortic atherosclerosis. Right chest port catheter. Normal heart size. No pericardial effusion. Mediastinum/Nodes: No enlarged mediastinal, hilar, or axillary lymph nodes. Thyroid gland, trachea, and esophagus demonstrate no significant findings. Lungs/Pleura: Mild paraseptal emphysema. No significant change in bandlike fibrotic scarring and volume loss about the hila, left greater than right, with post treatment appearance of soft tissue about the left hilum. There has been interval enlargement of an irregular nodule of the left upper lobe  measuring 1.6 x 1.0 cm, previously 1.0 x 0.8 cm (series 5, image 37), with a new adjacent nodule measuring 1.3 x 0.8 cm (series 5, image 35). Slight interval reduction in likely loculated posterior pleural fluid about the left hilum. Upper Abdomen: No acute abnormality. Stable subcentimeter nodule of the lateral limb of the left adrenal gland (series 2, image 163). Musculoskeletal: No chest wall mass or suspicious bone lesions identified. IMPRESSION: 1. No significant change in bandlike post radiation fibrotic scarring and volume loss about the hila, left greater than right, with post treatment appearance of soft tissue about the left hilum. 2. There has been continued interval enlargement of an irregular nodule of the left upper lobe measuring 1.6 x 1.0 cm, previously 1.0 x 0.8 cm (series 5, image 37), with a new adjacent nodule measuring 1.3 x 0.8 cm (series 5, image 35). Findings are concerning for recurrent/metastatic disease. Given size, these could be assessed for metabolic activity by PET-CT. Tissue sampling may be considered. 3.  Emphysema (ICD10-J43.9). 4.  Aortic Atherosclerosis (ICD10-I70.0). Electronically Signed   By: Eddie Candle M.D.   On: 08/08/2019 08:36   Nm Pet Image Restag (ps) Skull Base To Thigh  Result Date: 08/29/2019 CLINICAL DATA:  Subsequent treatment strategy for non-small cell lung cancer. New left lung nodules. EXAM: NUCLEAR MEDICINE PET SKULL BASE TO THIGH TECHNIQUE: 8.2 mCi F-18 FDG was injected intravenously. Full-ring PET imaging was performed from the skull base to thigh after the radiotracer. CT data was obtained and used for attenuation correction and anatomic localization. Fasting blood glucose: 90 mg/dl COMPARISON:  CT chest dated 08/08/2019 FINDINGS: Mediastinal blood pool activity: SUV max 2.2 Liver activity: SUV max NA NECK: No hypermetabolic cervical lymphadenopathy. Incidental CT findings: none CHEST: No hypermetabolic thoracic lymphadenopathy. Radiation changes in  the medial left upper lobe and bilateral paramediastinal regions. Stable soft tissue thickening in the left perihilar region (series 8/image 29). 7 mm subsolid nodule in the left upper lobe (series 8/image 12), previously 9 mm. Additional 9 mm sub solid nodular opacity in the left upper lobe (series 8/image 14), previously 14 mm. Associated mild hypermetabolism, max SUV 2.6. Incidental CT findings: Mild atherosclerotic calcifications of the aortic arch. Right chest port terminates at the cavoatrial junction. ABDOMEN/PELVIS: No hypermetabolic abdominopelvic lymphadenopathy. No abnormal hypermetabolism in the liver, spleen, pancreas, or adrenal glands. Incidental CT findings: Atherosclerotic calcifications the abdominal aorta and branch vessels. Mildly thick-walled bladder, although underdistended. SKELETON: No focal hypermetabolic activity to suggest skeletal metastasis. Incidental CT findings: none IMPRESSION: Improving subsolid nodularity in the left upper lobe, favoring infection/inflammation. Follow-up CT chest is suggested in 3-6 months. Radiation changes in the lungs bilaterally. Stable soft tissue thickening in the left perihilar region. No findings specific for recurrent or metastatic disease. Electronically Signed   By: Julian Hy M.D.   On: 08/29/2019 14:41    ASSESSMENT AND PLAN: This is a very pleasant  53 years old white male with recently diagnosed stage IIIB/IV non-small cell lung cancer, adenocarcinoma presented with large left hilar mass in addition to mediastinal and left supraclavicular lymphadenopathy as well as suspicious right upper lobe pulmonary nodule diagnosed in August 2018. 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 He underwent a course of concurrent chemoradiation to the lung as well as the epiglottic area under the care of Dr. Tammi Klippel. He is status post 6 cycle. He tolerated this course of treatment well except for the radiation  induced esophagitis as well as weight loss and fatigue. The patient had partial response to the previous treatment. He is currently on treatment with Keytruda 200 mg IV every 3 weeks status post 34 cycles. His last CT scan of the chest showed stable disease except for enlarging and the new adjacent pulmonary nodule in the left upper lobe concerning for disease recurrence/progression. The patient had a PET scan performed recently.  I personally and independently reviewed the PET scan and discussed the results with the patient.  His PET scan showed no concerning findings for disease recurrence or progression. I recommended for the patient to proceed with the last cycle of his treatment with Keytruda today. I will see him back for follow-up visit in 3 months for evaluation with repeat CT scan of the chest and neck for restaging of his disease. The patient was advised to call immediately if he has any concerning symptoms in the interval. The patient voices understanding of current disease status and treatment options and is in agreement with the current care plan. All questions were answered. The patient knows to call the clinic with any problems, questions or concerns. We can certainly see the patient much sooner if necessary. Disclaimer: This note was dictated with voice recognition software. Similar sounding words can inadvertently be transcribed and may not be corrected upon review.

## 2019-09-01 NOTE — Patient Instructions (Signed)
[facility] Discharge Instructions for Patients Receiving Chemotherapy  Today you received the following chemotherapy agents: Pembrolizumab [facility]).  To help prevent nausea and vomiting after your treatment, we encourage you to take your nausea medication as directed.   If you develop nausea and vomiting that is not controlled by your nausea medication, call the clinic.   BELOW ARE SYMPTOMS THAT SHOULD BE REPORTED IMMEDIATELY:  *FEVER GREATER THAN 100.5 F  *CHILLS WITH OR WITHOUT FEVER  NAUSEA AND VOMITING THAT IS NOT CONTROLLED WITH YOUR NAUSEA MEDICATION  *UNUSUAL SHORTNESS OF BREATH  *UNUSUAL BRUISING OR BLEEDING  TENDERNESS IN MOUTH AND THROAT WITH OR WITHOUT PRESENCE OF ULCERS  *URINARY PROBLEMS  *BOWEL PROBLEMS  UNUSUAL RASH Items with * indicate a potential emergency and should be followed up as soon as possible.  Feel free to call the clinic should you have any questions or concerns. The clinic phone number is [phone].  Please show the [facility] at check-in to the Emergency Department and triage nurse.  Coronavirus (COVID-19) Are you at risk?  Are you at risk for the Coronavirus (COVID-19)?  To be considered HIGH RISK for Coronavirus (COVID-19), you have to meet the following criteria:  . Traveled to [place], [place], [place], [place] or [place]; or in the [place] to [place], [place], [place], or [place]; and have fever, cough, and shortness of breath within the last 2 weeks of travel OR . Been in close contact with a person diagnosed with COVID-19 within the last 2 weeks and have fever, cough, and shortness of breath . IF YOU DO NOT MEET THESE CRITERIA, YOU ARE CONSIDERED LOW RISK FOR COVID-19.  What to do if you are HIGH RISK for COVID-19?  Marland Kitchen If you are having a medical emergency, call 911. . Seek medical care right away. Before you go to a doctor's office, urgent care or emergency department, call ahead and tell them  about your recent travel, contact with someone diagnosed with COVID-19, and your symptoms. You should receive instructions from your physician's office regarding next steps of care.  . When you arrive at healthcare provider, tell the healthcare staff immediately you have returned from visiting [place], [place], [place], [place] or [place]; or traveled in the [place] to [place], [place], [place], or [place]; in the last two weeks or you have been in close contact with a person diagnosed with COVID-19 in the last 2 weeks.   . Tell the health care staff about your symptoms: fever, cough and shortness of breath. . After you have been seen by a medical provider, you will be either: o Tested for (COVID-19) and discharged home on quarantine except to seek medical care if symptoms worsen, and asked to  - Stay home and avoid contact with others until you get your results (4-5 days)  - Avoid travel on public transportation if possible (such as bus, train, or airplane) or o Sent to the Emergency Department by EMS for evaluation, COVID-19 testing, and possible admission depending on your condition and test results.  What to do if you are LOW RISK for COVID-19?  Reduce your risk of any infection by using the same precautions used for avoiding the common cold or flu:  Marland Kitchen Wash your hands often with soap and warm water for at least 20 seconds.  If soap and water are not readily available, use an alcohol-based hand sanitizer with at least 60% alcohol.  . If coughing or  sneezing, cover your mouth and nose by coughing or sneezing into the elbow areas of your shirt or coat, into a tissue or into your sleeve (not your hands). . Avoid shaking hands with others and consider head nods or verbal greetings only. . Avoid touching your eyes, nose, or mouth with unwashed hands.  . Avoid close contact with people who are sick. . Avoid places or events with large numbers of people in one location, like concerts or  sporting events. . Carefully consider travel plans you have or are making. . If you are planning any travel outside or inside the [place], visit the CDC's Travelers' Health webpage for the latest health notices. . If you have some symptoms but not all symptoms, continue to monitor at home and seek medical attention if your symptoms worsen. . If you are having a medical emergency, call 911.   [facility] [facility] / e-Visit: [email or web address]         [facility]: [facility]: [phone]                   [facility]: [phone]

## 2019-09-02 ENCOUNTER — Telehealth: Payer: Self-pay | Admitting: Internal Medicine

## 2019-09-02 NOTE — Telephone Encounter (Signed)
I left a message regarding schedule  

## 2019-09-21 ENCOUNTER — Ambulatory Visit: Payer: 59 | Admitting: Physician Assistant

## 2019-09-21 ENCOUNTER — Other Ambulatory Visit: Payer: 59

## 2019-09-21 ENCOUNTER — Ambulatory Visit: Payer: 59

## 2019-09-28 ENCOUNTER — Telehealth: Payer: Self-pay | Admitting: *Deleted

## 2019-09-28 NOTE — Telephone Encounter (Signed)
Oncology Nurse Navigator Documentation  Oncology Nurse Navigator Flowsheets 09/28/2019  Abnormal Finding Date -  Confirmed Diagnosis Date -  Navigator Location [facility]  Referral Date to RadOnc/MedOnc -  Navigator Encounter Type Telephone/I received a call from Mr. Dier and left vm message for me to call him. I called and he needed an appt for port flush and change is schedule.  I completed and updated him.   Telephone Incoming Call;Outgoing Call  [facility] Date -  Multidisiplinary Clinic Type -  Treatment Initiated Date -  Patient Visit Type -  Treatment Phase Treatment  Barriers/Navigation Needs Coordination of Care;Education  Education Other  Interventions Coordination of Care;Education  Acuity Level 2-Minimal Needs (1-2 Barriers Identified)  Coordination of Care Appts  Education Method Verbal  Time Spent with Patient 30

## 2019-10-12 ENCOUNTER — Other Ambulatory Visit: Payer: Self-pay

## 2019-10-12 ENCOUNTER — Inpatient Hospital Stay: Payer: 59 | Attending: Internal Medicine

## 2019-10-12 DIAGNOSIS — C321 Malignant neoplasm of supraglottis: Secondary | ICD-10-CM | POA: Insufficient documentation

## 2019-10-12 DIAGNOSIS — Z9225 Personal history of immunosupression therapy: Secondary | ICD-10-CM | POA: Insufficient documentation

## 2019-10-12 DIAGNOSIS — Z452 Encounter for adjustment and management of vascular access device: Secondary | ICD-10-CM | POA: Diagnosis not present

## 2019-10-12 DIAGNOSIS — C3412 Malignant neoplasm of upper lobe, left bronchus or lung: Secondary | ICD-10-CM | POA: Diagnosis present

## 2019-10-12 DIAGNOSIS — Z95828 Presence of other vascular implants and grafts: Secondary | ICD-10-CM

## 2019-10-12 MED ORDER — HEPARIN SOD (PORK) LOCK FLUSH 100 UNIT/ML IV SOLN
250.0000 [IU] | Freq: Once | INTRAVENOUS | Status: AC
  Administered 2019-10-12: 250 [IU]
  Filled 2019-10-12: qty 5

## 2019-10-12 MED ORDER — SODIUM CHLORIDE 0.9% FLUSH
10.0000 mL | Freq: Once | INTRAVENOUS | Status: AC
  Administered 2019-10-12: 15:00:00 10 mL
  Filled 2019-10-12: qty 10

## 2019-10-13 ENCOUNTER — Other Ambulatory Visit: Payer: 59

## 2019-10-13 ENCOUNTER — Ambulatory Visit: Payer: 59

## 2019-10-13 ENCOUNTER — Ambulatory Visit: Payer: 59 | Admitting: Internal Medicine

## 2019-10-24 ENCOUNTER — Other Ambulatory Visit: Payer: Self-pay | Admitting: Internal Medicine

## 2019-10-25 ENCOUNTER — Other Ambulatory Visit: Payer: Self-pay | Admitting: Internal Medicine

## 2019-11-03 ENCOUNTER — Other Ambulatory Visit: Payer: 59

## 2019-11-03 ENCOUNTER — Ambulatory Visit: Payer: 59

## 2019-11-03 ENCOUNTER — Ambulatory Visit: Payer: 59 | Admitting: Internal Medicine

## 2019-11-25 ENCOUNTER — Ambulatory Visit (INDEPENDENT_AMBULATORY_CARE_PROVIDER_SITE_OTHER): Payer: 59 | Admitting: Internal Medicine

## 2019-11-25 DIAGNOSIS — J069 Acute upper respiratory infection, unspecified: Secondary | ICD-10-CM | POA: Diagnosis not present

## 2019-11-25 DIAGNOSIS — F411 Generalized anxiety disorder: Secondary | ICD-10-CM

## 2019-11-25 DIAGNOSIS — B001 Herpesviral vesicular dermatitis: Secondary | ICD-10-CM

## 2019-11-25 MED ORDER — HYDROCODONE-HOMATROPINE 5-1.5 MG/5ML PO SYRP: 5.0000 mL | ORAL_SOLUTION | Freq: Four times a day (QID) | ORAL | 0 refills | Status: AC | PRN

## 2019-11-25 MED ORDER — AZITHROMYCIN 250 MG PO TABS: ORAL_TABLET | ORAL | 0 refills | Status: DC

## 2019-11-25 MED ORDER — ACYCLOVIR 400 MG PO TABS: 400.0000 mg | ORAL_TABLET | Freq: Three times a day (TID) | ORAL | 3 refills | Status: DC

## 2019-11-25 NOTE — Progress Notes (Signed)
Patient ID: Trevor Zavala, male   DOB: 07/18/1966, 54 y.o.   MRN: [id number]  Virtual Visit via Video Note  I connected with [name] on 11/25/19 at  4:00 PM EST by a video enabled telemedicine application and verified that I am speaking with the correct person using two identifiers.  Location: Patient: at home Provider: at office   I discussed the limitations of evaluation and management by telemedicine and the availability of in person appointments. The patient expressed understanding and agreed to proceed.  History of Present Illness:  Here with 2-3 days acute onset fever, facial pain, pressure, headache, general weakness and malaise, and greenish d/c, with mild ST and cough worse in the AM, but pt denies chest pain, wheezing, increased sob or doe, orthopnea, PND, increased LE swelling, palpitations, dizziness or syncope.  Also with several cold sores apearred mild to mod to lips as well.  Denies worsening depressive symptoms, suicidal ideation, or panic; has ongoing anxiety Past Medical History:  Diagnosis Date  . Anxiety   . COPD (chronic obstructive pulmonary disease) ([facility])   . GERD (gastroesophageal reflux disease)   . Headache   . Hypertension   . Pneumonia   . Situational depression   . Stage III squamous cell carcinoma of left lung ([facility]) 06/19/2017   Lungs & Epiglottis  . Tubular adenoma of colon 01/2015  . Tubular adenoma of colon 01/2015   Past Surgical History:  Procedure Laterality Date  . COLONOSCOPY    . DIRECT LARYNGOSCOPY N/A 06/25/2017   Procedure: DIRECT LARYNGOSCOPY AND BIOPSY;  Surgeon: Rozetta Nunnery, MD;  Location: [facility];  Service: ENT;  Laterality: N/A;  . EAR CYST EXCISION N/A 11/17/2013   Procedure: SEBACEOUS CYST CHEST;  Surgeon: Joyice Faster. Cornett, MD;  Location: [facility];  Service: General;  Laterality: N/A;  . IR GASTROSTOMY TUBE MOD SED  08/10/2017  . IR GASTROSTOMY TUBE REMOVAL  09/29/2017  . KNEE  ARTHROSCOPY     LEFT  . LIPOMA EXCISION N/A 11/17/2013   Procedure: EXCISION LIPOMA FOREHEAD;  Surgeon: Joyice Faster. Cornett, MD;  Location: [facility];  Service: General;  Laterality: N/A;  . LUNG BIOPSY Bilateral 06/12/2017   Procedure: LEFT LUNG BIOPSY;  Surgeon: Grace Isaac, MD;  Location: [facility];  Service: Thoracic;  Laterality: Bilateral;  . MICROLARYNGOSCOPY W/VOCAL CORD INJECTION Left 07/16/2018   Procedure: suspened micro laryngoscopy with jet ventilation and prolaryn injection;  Surgeon: Melida Quitter, MD;  Location: [facility];  Service: ENT;  Laterality: Left;  . PORTACATH PLACEMENT Right 07/01/2017   Procedure: INSERTION PORT-A-CATH - RIGHT IJ - placed with Fluoro and Ultrasound;  Surgeon: Grace Isaac, MD;  Location: [facility];  Service: Thoracic;  Laterality: Right;  Marland Kitchen VIDEO BRONCHOSCOPY WITH ENDOBRONCHIAL ULTRASOUND N/A 06/12/2017   Procedure: VIDEO BRONCHOSCOPY WITH ENDOBRONCHIAL ULTRASOUND;  Surgeon: Grace Isaac, MD;  Location: [facility];  Service: Thoracic;  Laterality: N/A;    reports that he quit smoking about 2 years ago. He has a 8.75 pack-year smoking history. He has never used smokeless tobacco. He reports current alcohol use. He reports that he does not use drugs. family history includes Breast cancer in his mother; Cancer (age of onset: 75) in his father; Colon cancer in his father and mother; Colon polyps in his father; Hypertension in his father and mother; Skin cancer in his father. Allergies  Allergen Reactions  . Escitalopram Oxalate     "Bad thoughts" "Bad thoughts"  Current Outpatient Medications on File Prior to Visit  Medication Sig Dispense Refill  . clonazePAM (KLONOPIN) 0.5 MG tablet TAKE 1 OR 2 TABLETS BY MOUTH TWICE A DAY AS NEEDED FOR ANXIETY 180 tablet 0  . diphenhydrAMINE (BENADRYL) 25 MG tablet Take 12.5 mg by mouth every 6 (six) hours as needed for itching.    . esomeprazole (NEXIUM) 40 MG capsule TAKE 1 CAPSULE BY MOUTH EVERY DAY IN  THE MORNING 90 capsule 3  . fexofenadine-pseudoephedrine (ALLEGRA-D 24) 180-240 MG 24 hr tablet Take 1 tablet by mouth daily.    . fluticasone furoate-vilanterol (BREO ELLIPTA) 100-25 MCG/INH AEPB INHALE 1 PUFF INTO THE LUNGS DAILY. 3 each 3  . Multiple Vitamin (MULTIVITAMIN WITH MINERALS) TABS tablet Take 1 tablet by mouth daily.    . pembrolizumab (KEYTRUDA) 100 MG/4ML SOLN Inject 2 mg/kg into the vein. 200mg  every 3 weeks.     No current facility-administered medications on file prior to visit.    Observations/Objective: Alert, NAD, appropriate mood and affect, resps normal, cn 2-12 intact, moves all 4s, no visible rash or swelling Lab Results  Component Value Date   WBC 5.9 09/01/2019   HGB 15.8 09/01/2019   HCT 46.3 09/01/2019   PLT 198 09/01/2019   GLUCOSE 124 (H) 09/01/2019   CHOL 174 11/23/2014   TRIG 93.0 11/23/2014   HDL 52.50 11/23/2014   LDLDIRECT 91.6 08/19/2010   LDLCALC 103 (H) 11/23/2014   ALT 13 09/01/2019   AST 16 09/01/2019   NA 141 09/01/2019   K 4.2 09/01/2019   CL 107 09/01/2019   CREATININE 0.72 09/01/2019   BUN 11 09/01/2019   CO2 25 09/01/2019   TSH 2.175 09/01/2019   PSA 1.24 11/23/2014   INR 1.14 08/13/2017   HGBA1C 6.4 (H) 08/17/2017   Assessment and Plan: See notes  Follow Up Instructions: See notes   I discussed the assessment and treatment plan with the patient. The patient was provided an opportunity to ask questions and all were answered. The patient agreed with the plan and demonstrated an understanding of the instructions.   The patient was advised to call back or seek an in-person evaluation if the symptoms worsen or if the condition fails to improve as anticipated.  [name], MD

## 2019-11-26 ENCOUNTER — Encounter: Payer: Self-pay | Admitting: Internal Medicine

## 2019-11-26 DIAGNOSIS — J069 Acute upper respiratory infection, unspecified: Secondary | ICD-10-CM | POA: Insufficient documentation

## 2019-11-26 NOTE — Assessment & Plan Note (Signed)
Mild to mod, for acyclovircourse,  to f/u any worsening symptoms or concerns

## 2019-11-26 NOTE — Assessment & Plan Note (Signed)
stable overall by history and exam, recent data reviewed with pt, and pt to continue medical treatment as before,  to f/u any worsening symptoms or concerns  

## 2019-11-26 NOTE — Patient Instructions (Signed)
Please take all new medication as prescribed  Please continue all other medications as before, and refills have been done if requested.  Please have the pharmacy call with any other refills you may need.  Please keep your appointments with your specialists as you may have planned     

## 2019-11-26 NOTE — Assessment & Plan Note (Signed)
Mild to mod, for antibx course,  to f/u any worsening symptoms or concerns 

## 2019-12-02 ENCOUNTER — Encounter (HOSPITAL_COMMUNITY): Payer: Self-pay

## 2019-12-02 ENCOUNTER — Inpatient Hospital Stay: Payer: 59

## 2019-12-02 ENCOUNTER — Inpatient Hospital Stay: Payer: 59 | Attending: Internal Medicine

## 2019-12-02 ENCOUNTER — Ambulatory Visit (HOSPITAL_COMMUNITY)
Admission: RE | Admit: 2019-12-02 | Discharge: 2019-12-02 | Disposition: A | Discharge status: Home / Self Care | Payer: 59 | Source: Ambulatory Visit | Attending: Internal Medicine | Admitting: Internal Medicine

## 2019-12-02 ENCOUNTER — Telehealth: Payer: Self-pay | Admitting: *Deleted

## 2019-12-02 ENCOUNTER — Other Ambulatory Visit: Payer: Self-pay

## 2019-12-02 DIAGNOSIS — I700 Atherosclerosis of aorta: Secondary | ICD-10-CM | POA: Insufficient documentation

## 2019-12-02 DIAGNOSIS — C321 Malignant neoplasm of supraglottis: Secondary | ICD-10-CM

## 2019-12-02 DIAGNOSIS — R918 Other nonspecific abnormal finding of lung field: Secondary | ICD-10-CM | POA: Diagnosis not present

## 2019-12-02 DIAGNOSIS — C3490 Malignant neoplasm of unspecified part of unspecified bronchus or lung: Secondary | ICD-10-CM | POA: Diagnosis not present

## 2019-12-02 DIAGNOSIS — J439 Emphysema, unspecified: Secondary | ICD-10-CM | POA: Insufficient documentation

## 2019-12-02 DIAGNOSIS — Z95828 Presence of other vascular implants and grafts: Secondary | ICD-10-CM

## 2019-12-02 LAB — CMP (CANCER CENTER ONLY)
ALT: 7 U/L (ref 0–44)
AST: 11 U/L — ABNORMAL LOW (ref 15–41)
Albumin: 3.5 g/dL (ref 3.5–5.0)
Alkaline Phosphatase: 68 U/L (ref 38–126)
Anion gap: 9 (ref 5–15)
BUN: 11 mg/dL (ref 6–20)
CO2: 28 mmol/L (ref 22–32)
Calcium: 9 mg/dL (ref 8.9–10.3)
Chloride: 104 mmol/L (ref 98–111)
Creatinine: 0.64 mg/dL (ref 0.61–1.24)
GFR, Est AFR Am: >60 mL/min (ref >60)
GFR, Estimated: >60 mL/min (ref >60)
Glucose, Bld: 93 mg/dL (ref 70–99)
Potassium: 4.2 mmol/L (ref 3.5–5.1)
Sodium: 141 mmol/L (ref 135–145)
Total Bilirubin: 0.4 mg/dL (ref 0.3–1.2)
Total Protein: 6.2 g/dL — ABNORMAL LOW (ref 6.5–8.1)

## 2019-12-02 LAB — CBC WITH DIFFERENTIAL (CANCER CENTER ONLY)
Abs Immature Granulocytes: 0.03 10*3/uL (ref 0.00–0.07)
Basophils Absolute: 0 10*3/uL (ref 0.0–0.1)
Basophils Relative: 1 %
Eosinophils Absolute: 0 10*3/uL (ref 0.0–0.5)
Eosinophils Relative: 1 %
HCT: 45.3 % (ref 39.0–52.0)
Hemoglobin: 15 g/dL (ref 13.0–17.0)
Immature Granulocytes: 1 %
Lymphocytes Relative: 7 %
Lymphs Abs: 0.4 10*3/uL — ABNORMAL LOW (ref 0.7–4.0)
MCH: 32.9 pg (ref 26.0–34.0)
MCHC: 33.1 g/dL (ref 30.0–36.0)
MCV: 99.3 fL (ref 80.0–100.0)
Monocytes Absolute: 0.6 10*3/uL (ref 0.1–1.0)
Monocytes Relative: 9 %
Neutro Abs: 4.9 10*3/uL (ref 1.7–7.7)
Neutrophils Relative %: 81 %
Platelet Count: 236 10*3/uL (ref 150–400)
RBC: 4.56 MIL/uL (ref 4.22–5.81)
RDW: 12.2 % (ref 11.5–15.5)
WBC Count: 6 10*3/uL (ref 4.0–10.5)
nRBC: 0 % (ref 0.0–0.2)

## 2019-12-02 MED ORDER — IOHEXOL 300 MG/ML  SOLN
75.0000 mL | Freq: Once | INTRAMUSCULAR | Status: AC | PRN
  Administered 2019-12-02: 75 mL via INTRAVENOUS

## 2019-12-02 MED ORDER — HEPARIN SOD (PORK) LOCK FLUSH 100 UNIT/ML IV SOLN
500.0000 [IU] | Freq: Once | INTRAVENOUS | Status: AC
  Administered 2019-12-02: 500 [IU] via INTRAVENOUS

## 2019-12-02 MED ORDER — SODIUM CHLORIDE 0.9% FLUSH
10.0000 mL | Freq: Once | INTRAVENOUS | Status: AC
  Administered 2019-12-02: 10 mL
  Filled 2019-12-02: qty 10

## 2019-12-02 MED ORDER — SODIUM CHLORIDE (PF) 0.9 % IJ SOLN
INTRAMUSCULAR | Status: AC
  Filled 2019-12-02: qty 50

## 2019-12-02 MED ORDER — HEPARIN SOD (PORK) LOCK FLUSH 100 UNIT/ML IV SOLN
INTRAVENOUS | Status: AC
  Filled 2019-12-02: qty 5

## 2019-12-02 NOTE — Telephone Encounter (Signed)
Records faxed to [facility] - release [phone]

## 2019-12-02 NOTE — Patient Instructions (Signed)

## 2019-12-05 ENCOUNTER — Inpatient Hospital Stay (HOSPITAL_BASED_OUTPATIENT_CLINIC_OR_DEPARTMENT_OTHER): Payer: 59 | Admitting: Internal Medicine

## 2019-12-05 ENCOUNTER — Encounter: Payer: Self-pay | Admitting: Internal Medicine

## 2019-12-05 ENCOUNTER — Other Ambulatory Visit: Payer: Self-pay

## 2019-12-05 VITALS — BP 122/86 | HR 85 | Temp 97.8°F | Resp 20 | Ht 71.0 in | Wt 151.4 lb

## 2019-12-05 DIAGNOSIS — I10 Essential (primary) hypertension: Secondary | ICD-10-CM | POA: Insufficient documentation

## 2019-12-05 DIAGNOSIS — K219 Gastro-esophageal reflux disease without esophagitis: Secondary | ICD-10-CM | POA: Insufficient documentation

## 2019-12-05 DIAGNOSIS — Z8521 Personal history of malignant neoplasm of larynx: Secondary | ICD-10-CM | POA: Insufficient documentation

## 2019-12-05 DIAGNOSIS — Z923 Personal history of irradiation: Secondary | ICD-10-CM | POA: Diagnosis not present

## 2019-12-05 DIAGNOSIS — C3412 Malignant neoplasm of upper lobe, left bronchus or lung: Secondary | ICD-10-CM | POA: Insufficient documentation

## 2019-12-05 DIAGNOSIS — I700 Atherosclerosis of aorta: Secondary | ICD-10-CM | POA: Insufficient documentation

## 2019-12-05 DIAGNOSIS — J449 Chronic obstructive pulmonary disease, unspecified: Secondary | ICD-10-CM | POA: Insufficient documentation

## 2019-12-05 DIAGNOSIS — F419 Anxiety disorder, unspecified: Secondary | ICD-10-CM | POA: Diagnosis not present

## 2019-12-05 DIAGNOSIS — Z7951 Long term (current) use of inhaled steroids: Secondary | ICD-10-CM | POA: Diagnosis not present

## 2019-12-05 DIAGNOSIS — C321 Malignant neoplasm of supraglottis: Secondary | ICD-10-CM | POA: Diagnosis not present

## 2019-12-05 DIAGNOSIS — Z86010 Personal history of colonic polyps: Secondary | ICD-10-CM | POA: Insufficient documentation

## 2019-12-05 DIAGNOSIS — C3490 Malignant neoplasm of unspecified part of unspecified bronchus or lung: Secondary | ICD-10-CM | POA: Diagnosis not present

## 2019-12-05 DIAGNOSIS — Z9221 Personal history of antineoplastic chemotherapy: Secondary | ICD-10-CM | POA: Diagnosis not present

## 2019-12-05 DIAGNOSIS — Z79899 Other long term (current) drug therapy: Secondary | ICD-10-CM | POA: Diagnosis not present

## 2019-12-05 NOTE — Progress Notes (Signed)
[facility] [phone]   [phone]  OFFICE PROGRESS NOTE  [name], [name], MD [place] [address]  DIAGNOSIS:  1) Stage IIB/IV (T3, N3, M1a) non-small cell lung cancer, squamous cell carcinoma presented with large left hilar mass in addition to mediastinal and left supraclavicular lymphadenopathy as well as contralateral right upper lobe nodule diagnosed in August 2018. PDL 1 expression: 90%. 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 First dose 06/29/2017. Status post 6 cycles. Last dose was given 08/03/2017. 2)  Immunotherapy with Ketruda 200 MG IV every 3 weeks, first dose 09/17/2017.  Status post 35 cycles.  CURRENT THERAPY: Observation.  INTERVAL HISTORY: Trevor Zavala 54 y.o. male returns to the clinic today for follow-up visit.  The patient is feeling fine today with no concerning complaints.  He was treated recently for questionable pneumonia.  The patient denied having any current chest pain, shortness of breath, cough or hemoptysis.  He denied having any fever or chills.  He has no nausea, vomiting, diarrhea or constipation.  He denied having any significant weight loss or night sweats.  Is here today for evaluation with repeat CT scan of the neck and the chest for restaging of his disease.  MEDICAL HISTORY: Past Medical History:  Diagnosis Date   Anxiety    COPD (chronic obstructive pulmonary disease) (HCC)    GERD (gastroesophageal reflux disease)    Headache    Hypertension    Pneumonia    Situational depression    Stage III squamous cell carcinoma of left lung ([place]) 06/19/2017   Lungs & Epiglottis   Tubular adenoma of colon 01/2015   Tubular adenoma of colon 01/2015    ALLERGIES:  is allergic to escitalopram oxalate.  MEDICATIONS:  Current Outpatient  Medications  Medication Sig Dispense Refill   acyclovir (ZOVIRAX) 400 MG tablet Take 1 tablet (400 mg total) by mouth 3 (three) times daily. 21 tablet 3   azithromycin (ZITHROMAX) 250 MG tablet 2 tab by mouth day 1, then 1 per day 6 tablet 0   clonazePAM (KLONOPIN) 0.5 MG tablet TAKE 1 OR 2 TABLETS BY MOUTH TWICE A DAY AS NEEDED FOR ANXIETY 180 tablet 0   diphenhydrAMINE (BENADRYL) 25 MG tablet Take 12.5 mg by mouth every 6 (six) hours as needed for itching.     esomeprazole (NEXIUM) 40 MG capsule TAKE 1 CAPSULE BY MOUTH EVERY DAY IN THE MORNING 90 capsule 3   fexofenadine-pseudoephedrine (ALLEGRA-D 24) 180-240 MG 24 hr tablet Take 1 tablet by mouth daily.     fluticasone furoate-vilanterol (BREO ELLIPTA) 100-25 MCG/INH AEPB INHALE 1 PUFF INTO THE LUNGS DAILY. 3 each 3   HYDROcodone-homatropine (HYCODAN) 5-1.5 MG/5ML syrup Take 5 mLs by mouth every 6 (six) hours as needed for up to 10 days for cough. 180 mL 0   Multiple Vitamin (MULTIVITAMIN WITH MINERALS) TABS tablet Take 1 tablet by mouth daily.     pembrolizumab (KEYTRUDA) 100 MG/4ML SOLN Inject 2 mg/kg into the vein. 200mg  every 3 weeks.     No current facility-administered medications for this visit.    SURGICAL HISTORY:  Past Surgical History:  Procedure Laterality Date   COLONOSCOPY     DIRECT LARYNGOSCOPY N/A 06/25/2017   Procedure: DIRECT LARYNGOSCOPY AND BIOPSY;  Surgeon: Rozetta Nunnery, MD;  Location: [facility];  Service: ENT;  Laterality: N/A;   EAR CYST EXCISION N/A 11/17/2013   Procedure: SEBACEOUS CYST CHEST;  Surgeon: Joyice Faster. Cornett, MD;  Location: [facility];  Service: General;  Laterality: N/A;   IR GASTROSTOMY TUBE MOD SED  08/10/2017   IR GASTROSTOMY TUBE REMOVAL  09/29/2017   KNEE ARTHROSCOPY     LEFT   LIPOMA EXCISION N/A 11/17/2013   Procedure: EXCISION LIPOMA FOREHEAD;  Surgeon: Joyice Faster. Cornett, MD;  Location: [facility];  Service: General;   Laterality: N/A;   LUNG BIOPSY Bilateral 06/12/2017   Procedure: LEFT LUNG BIOPSY;  Surgeon: Grace Isaac, MD;  Location: [facility];  Service: Thoracic;  Laterality: Bilateral;   MICROLARYNGOSCOPY W/VOCAL CORD INJECTION Left 07/16/2018   Procedure: suspened micro laryngoscopy with jet ventilation and prolaryn injection;  Surgeon: Melida Quitter, MD;  Location: [facility];  Service: ENT;  Laterality: Left;   PORTACATH PLACEMENT Right 07/01/2017   Procedure: INSERTION PORT-A-CATH - RIGHT IJ - placed with Fluoro and Ultrasound;  Surgeon: Grace Isaac, MD;  Location: [facility];  Service: Thoracic;  Laterality: Right;   VIDEO BRONCHOSCOPY WITH ENDOBRONCHIAL ULTRASOUND N/A 06/12/2017   Procedure: VIDEO BRONCHOSCOPY WITH ENDOBRONCHIAL ULTRASOUND;  Surgeon: Grace Isaac, MD;  Location: [facility];  Service: Thoracic;  Laterality: N/A;    REVIEW OF SYSTEMS:  A comprehensive review of systems was negative.   PHYSICAL EXAMINATION: General appearance: alert, cooperative and no distress Head: Normocephalic, without obvious abnormality, atraumatic Neck: no adenopathy, no JVD, supple, symmetrical, trachea midline and thyroid not enlarged, symmetric, no tenderness/mass/nodules Lymph nodes: Cervical, supraclavicular, and axillary nodes normal. Resp: clear to auscultation bilaterally Back: symmetric, no curvature. ROM normal. No CVA tenderness. Cardio: regular rate and rhythm, S1, S2 normal, no murmur, click, rub or gallop GI: soft, non-tender; bowel sounds normal; no masses,  no organomegaly Extremities: extremities normal, atraumatic, no cyanosis or edema  ECOG PERFORMANCE STATUS: 0 - Asymptomatic  Blood pressure 122/86, pulse 85, temperature 97.8 F (36.6 C), temperature source Temporal, resp. rate 20, height 5\' 11"  (1.803 m), weight 151 lb 6.4 oz (68.7 kg), SpO2 100 %.  LABORATORY DATA: Lab Results  Component Value Date   WBC 6.0 12/02/2019   HGB 15.0 12/02/2019   HCT 45.3 12/02/2019   MCV 99.3  12/02/2019   PLT 236 12/02/2019      Chemistry      Component Value Date/Time   NA 141 12/02/2019 0930   NA 141 10/29/2017 1049   K 4.2 12/02/2019 0930   K 3.3 (L) 10/29/2017 1049   CL 104 12/02/2019 0930   CO2 28 12/02/2019 0930   CO2 26 10/29/2017 1049   BUN 11 12/02/2019 0930   BUN 11.2 10/29/2017 1049   CREATININE 0.64 12/02/2019 0930   CREATININE 0.7 10/29/2017 1049   GLU 198 08/25/2017 0000      Component Value Date/Time   CALCIUM 9.0 12/02/2019 0930   CALCIUM 9.0 10/29/2017 1049   ALKPHOS 68 12/02/2019 0930   ALKPHOS 82 10/29/2017 1049   AST 11 (L) 12/02/2019 0930   AST 10 10/29/2017 1049   ALT 7 12/02/2019 0930   ALT 8 10/29/2017 1049   BILITOT 0.4 12/02/2019 0930   BILITOT 0.22 10/29/2017 1049       RADIOGRAPHIC STUDIES: CT Soft Tissue Neck W Contrast  Result Date: 12/02/2019 CLINICAL DATA:  Staging of epiglottic cancer and non-small cell lung cancer. EXAM: CT NECK WITH CONTRAST TECHNIQUE: Multidetector CT imaging of the neck was  performed using the standard protocol following the bolus administration of intravenous contrast. CONTRAST:  64mL OMNIPAQUE IOHEXOL 300 MG/ML  SOLN COMPARISON:  05/17/2019. FINDINGS: Pharynx and larynx: No evidence of mucosal or submucosal lesion. No evidence of recurrent disease in the region of the epiglottis. Salivary glands: Some atrophic change of the submandibular glands. Parotid glands appear normal. Thyroid: Normal Lymph nodes: No enlarged or low-density nodes on either side of the neck. Vascular: Calcification at the carotid bifurcations. No acute vascular finding. Limited intracranial: Normal Visualized orbits: Not included Mastoids and visualized paranasal sinuses: Clear. Previous mastoidectomy on the left. Skeleton: Negative Upper chest: See results of chest CT. Other: None IMPRESSION: No evidence of residual or recurrent disease in the neck.NI-RADS category 1. Electronically Signed   By: Nelson Chimes M.D.   On: 12/02/2019 11:30    CT Chest W Contrast  Result Date: 12/02/2019 CLINICAL DATA:  Lung and epiglottic cancer restaging, status post chemo XRT EXAM: CT CHEST WITH CONTRAST TECHNIQUE: Multidetector CT imaging of the chest was performed during intravenous contrast administration. CONTRAST:  22mL OMNIPAQUE IOHEXOL 300 MG/ML  SOLN COMPARISON:  PET-CT, 08/29/2019, CT chest, 08/08/2019 FINDINGS: Cardiovascular: Right chest port catheter. Scattered aortic atherosclerosis. Normal heart size. No pericardial effusion. Mediastinum/Nodes: Unchanged soft tissue thickening about the left hilum no enlarged mediastinal, hilar, or axillary lymph nodes. Thyroid gland, trachea, and esophagus demonstrate no significant findings. Lungs/Pleura: Mild centrilobular emphysema. There is a new, irregular nodule of the left upper lobe measuring 1.3 x 0.9 cm. Adjacent nodules of the left upper lobe previously seen, are stable or slightly diminished compared to prior examination, the largest measuring 1.1 x 0.6 cm, previously 1.6 x 0.9 cm (series 3, image 20, 23, 27). There is a new subpleural consolidation of the medial segment right middle lobe, the dominant component measuring 3.5 x 3.0 cm (series 3, image 109). Redemonstrated paramedian radiation fibrosis of the bilateral upper lobes. Unchanged post treatment appearance of a perihilar left lung mass (series 3, image 58). Unchanged small, loculated left pleural effusion and/or pleural thickening. Upper Abdomen: No acute abnormality. Unchanged small left adrenal nodule without FDG avidity, most likely a benign adrenal adenoma. Musculoskeletal: No chest wall mass or suspicious bone lesions identified. IMPRESSION: 1. There is a new, irregular nodule of the left upper lobe measuring 1.3 x 0.9 cm. Adjacent nodules of the left upper lobe previously seen, are stable or slightly diminished compared to prior examination, the largest measuring 1.1 x 0.6 cm, previously 1.6 x 0.9 cm (series 3, image 20, 23, 27). There  is a new subpleural consolidation of the medial segment right middle lobe, the dominant component measuring 3.5 x 3.0 cm (series 3, image 109). This general pattern of fluctuating nodularity suggests an ongoing infectious or inflammatory etiology, metastatic disease less favored. Differential considerations generally include atypical infection as well as drug toxicity. 2. Unchanged post treatment appearance of a perihilar left lung mass and soft tissue about the left hilum. 3. Unchanged small, loculated left pleural effusion and/or pleural thickening. 4.  Emphysema (ICD10-J43.9). 5.  Aortic Atherosclerosis (ICD10-I70.0). Electronically Signed   By: Eddie Candle M.D.   On: 12/02/2019 11:40    ASSESSMENT AND PLAN: This is a very pleasant 54 years old white male with recently diagnosed stage IIIB/IV non-small cell lung cancer, adenocarcinoma presented with large left hilar mass in addition to mediastinal and left supraclavicular lymphadenopathy as well as suspicious right upper lobe pulmonary nodule diagnosed in August 2018. 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 He underwent a course of concurrent chemoradiation to the lung as well as the epiglottic area under the care of Dr. Tammi Klippel. He is status post 6 cycle. He tolerated this course of treatment well except for the radiation induced esophagitis as well as weight loss and fatigue. The patient had partial response to the previous treatment. He completed treatment with Keytruda 200 mg IV every 3 weeks status post 35 cycles. The patient tolerated his previous treatment fairly well with no concerning adverse effects. He is currently on observation and feeling fine. He had repeat CT scan of the neck and the chest performed recently.  I personally and independently reviewed the scans and discussed the results with the patient today. Has a scan showed no concerning findings for disease progression but there was an  area of subpleural consolidation in the medial segment of the right middle lobe concerning for inflammatory process but close follow-up is recommended. I recommended for the patient to continue on observation with repeat CT scan of the neck and the chest in 3 months for reevaluation of his disease. For the Port-A-Cath we will arrange for the patient to have flush every 6 weeks. He was advised to call immediately if he has any concerning symptoms in the interval. The patient voices understanding of current disease status and treatment options and is in agreement with the current care plan. All questions were answered. The patient knows to call the clinic with any problems, questions or concerns. We can certainly see the patient much sooner if necessary. Disclaimer: This note was dictated with voice recognition software. Similar sounding words can inadvertently be transcribed and may not be corrected upon review.

## 2019-12-06 ENCOUNTER — Other Ambulatory Visit: Payer: Self-pay | Admitting: Internal Medicine

## 2019-12-07 ENCOUNTER — Telehealth: Payer: Self-pay

## 2019-12-07 NOTE — Telephone Encounter (Signed)
New message    The patient is asking for Breo coupon 100 mcg/51mcg    Patient calling the office for samples of medication:   1.  What medication and dosage are you requesting samples for? fluticasone furoate-vilanterol (BREO ELLIPTA) 100-25 MCG/INH AEPB  2.  Are you currently out of this medication? Yes

## 2019-12-07 NOTE — Telephone Encounter (Signed)
Pt came into office for coupon card and samples.

## 2019-12-27 ENCOUNTER — Telehealth: Payer: Self-pay | Admitting: Emergency Medicine

## 2019-12-27 DIAGNOSIS — C3490 Malignant neoplasm of unspecified part of unspecified bronchus or lung: Secondary | ICD-10-CM

## 2019-12-27 NOTE — Telephone Encounter (Signed)
LVM to have pt come in today for appt and to call back to confirm appt.

## 2019-12-27 NOTE — Telephone Encounter (Signed)
Returning VM from pt's wife reporting night sweats, fatigue/weakness, and unspecified chest pain.  No answer, left VM requesting call back as soon as possible if pt wants to be seen in [facility] today.  MD [facility] aware.  Called wife and spoke with her regarding pt's symptoms.  Wife denies fever/chills or SOB.  Recently negative Covid-19 test.  Wife reports soreness that is ongoing in upper chest/back w/out other cardiac symptoms, as well as worsening chronic cough.  Wife to inform pt of [facility]/lab appts tomorrow morning starting at 0900.  Orders placed, high priority message sent for scheduling.  MD [facility] aware.

## 2019-12-28 ENCOUNTER — Inpatient Hospital Stay (HOSPITAL_BASED_OUTPATIENT_CLINIC_OR_DEPARTMENT_OTHER): Payer: 59 | Admitting: Medical

## 2019-12-28 ENCOUNTER — Inpatient Hospital Stay: Payer: 59 | Attending: Internal Medicine

## 2019-12-28 ENCOUNTER — Other Ambulatory Visit: Payer: Self-pay

## 2019-12-28 VITALS — BP 111/76 | HR 89 | Temp 98.5°F | Resp 18 | Ht 71.0 in | Wt 153.6 lb

## 2019-12-28 DIAGNOSIS — C3412 Malignant neoplasm of upper lobe, left bronchus or lung: Secondary | ICD-10-CM | POA: Diagnosis present

## 2019-12-28 DIAGNOSIS — C3490 Malignant neoplasm of unspecified part of unspecified bronchus or lung: Secondary | ICD-10-CM

## 2019-12-28 DIAGNOSIS — C321 Malignant neoplasm of supraglottis: Secondary | ICD-10-CM

## 2019-12-28 LAB — CBC WITH DIFFERENTIAL (CANCER CENTER ONLY)
Abs Immature Granulocytes: 0.06 10*3/uL (ref 0.00–0.07)
Basophils Absolute: 0 10*3/uL (ref 0.0–0.1)
Basophils Relative: 0 %
Eosinophils Absolute: 0.2 10*3/uL (ref 0.0–0.5)
Eosinophils Relative: 2 %
HCT: 41.9 % (ref 39.0–52.0)
Hemoglobin: 14.1 g/dL (ref 13.0–17.0)
Immature Granulocytes: 1 %
Lymphocytes Relative: 5 %
Lymphs Abs: 0.5 10*3/uL — ABNORMAL LOW (ref 0.7–4.0)
MCH: 33.1 pg (ref 26.0–34.0)
MCHC: 33.7 g/dL (ref 30.0–36.0)
MCV: 98.4 fL (ref 80.0–100.0)
Monocytes Absolute: 0.8 10*3/uL (ref 0.1–1.0)
Monocytes Relative: 7 %
Neutro Abs: 8.9 10*3/uL — ABNORMAL HIGH (ref 1.7–7.7)
Neutrophils Relative %: 85 %
Platelet Count: 354 10*3/uL (ref 150–400)
RBC: 4.26 MIL/uL (ref 4.22–5.81)
RDW: 11.6 % (ref 11.5–15.5)
WBC Count: 10.5 10*3/uL (ref 4.0–10.5)
nRBC: 0 % (ref 0.0–0.2)

## 2019-12-28 LAB — CMP (CANCER CENTER ONLY)
ALT: 12 U/L (ref 0–44)
AST: 11 U/L — ABNORMAL LOW (ref 15–41)
Albumin: 2.8 g/dL — ABNORMAL LOW (ref 3.5–5.0)
Alkaline Phosphatase: 75 U/L (ref 38–126)
Anion gap: 9 (ref 5–15)
BUN: 10 mg/dL (ref 6–20)
CO2: 33 mmol/L — ABNORMAL HIGH (ref 22–32)
Calcium: 9.3 mg/dL (ref 8.9–10.3)
Chloride: 100 mmol/L (ref 98–111)
Creatinine: 0.71 mg/dL (ref 0.61–1.24)
GFR, Est AFR Am: >60 mL/min (ref >60)
GFR, Estimated: >60 mL/min (ref >60)
Glucose, Bld: 130 mg/dL — ABNORMAL HIGH (ref 70–99)
Potassium: 3.8 mmol/L (ref 3.5–5.1)
Sodium: 142 mmol/L (ref 135–145)
Total Bilirubin: <0.2 mg/dL — ABNORMAL LOW (ref 0.3–1.2)
Total Protein: 6.3 g/dL — ABNORMAL LOW (ref 6.5–8.1)

## 2019-12-28 NOTE — Progress Notes (Signed)
Trevor Zavala 54 years old white male with a stage IIIB/IV non-small cell lung cancer, adenocarcinoma presented with large left hilar mass in addition to mediastinal and left supraclavicular lymphadenopathy as well as suspicious right upper lobe pulmonary nodule diagnosed in August 2018.  He is followed by Dr. Julien Nordmann and is currently treated with pembrolizumab.  He presents to the clinic today with a report that he has had back spasm, soreness of the sides and soreness of his chest along with insomnia and mild anorexia since receiving his COVID-19 injection on 12/16/2019.  He presents to clinic today stating that he is feeling substantially better today.  The patient was reassured likely a result of his COVID-19 shot.  He was instructed to alternate Tylenol and Motrin with his next injection if needed.  He expresses understanding and agreement with this plan.  This case was discussed with Dr. Julien Nordmann.  Sandi Mealy, MHS, PA-C Physician Assistant

## 2019-12-28 NOTE — Patient Instructions (Signed)

## 2019-12-28 NOTE — Progress Notes (Signed)
Pt received first Covid-19 vaccine on 12/16/19, reports that symptoms of fatigue, muscle aches, insomnia, night sweats, cough, and mild anorexia started w/in two days.  Reports that today he feels the best he has in weeks.  Afebrile.  VSS.  Reports eating a good meal this am and sleeping well last night.  Pt encouraged to take second Covid-19 vaccine, but to be aware that he may have similar symptoms afterwards and to monitor/take precautions as needed.  Pt verbalized understanding.

## 2020-01-13 ENCOUNTER — Other Ambulatory Visit: Payer: Self-pay

## 2020-01-13 ENCOUNTER — Inpatient Hospital Stay: Payer: 59 | Attending: Internal Medicine

## 2020-01-13 DIAGNOSIS — C321 Malignant neoplasm of supraglottis: Secondary | ICD-10-CM

## 2020-01-13 DIAGNOSIS — Z95828 Presence of other vascular implants and grafts: Secondary | ICD-10-CM

## 2020-01-13 DIAGNOSIS — Z452 Encounter for adjustment and management of vascular access device: Secondary | ICD-10-CM | POA: Diagnosis not present

## 2020-01-13 DIAGNOSIS — C3412 Malignant neoplasm of upper lobe, left bronchus or lung: Secondary | ICD-10-CM | POA: Insufficient documentation

## 2020-01-13 MED ORDER — SODIUM CHLORIDE 0.9% FLUSH
10.0000 mL | Freq: Once | INTRAVENOUS | Status: AC
  Administered 2020-01-13: 10 mL
  Filled 2020-01-13: qty 10

## 2020-01-13 MED ORDER — HEPARIN SOD (PORK) LOCK FLUSH 100 UNIT/ML IV SOLN
500.0000 [IU] | Freq: Once | INTRAVENOUS | Status: AC
  Administered 2020-01-13: 500 [IU]
  Filled 2020-01-13: qty 5

## 2020-01-13 NOTE — Patient Instructions (Signed)

## 2020-03-02 ENCOUNTER — Inpatient Hospital Stay: Payer: 59 | Attending: Internal Medicine

## 2020-03-02 ENCOUNTER — Ambulatory Visit (HOSPITAL_COMMUNITY): Payer: 59

## 2020-03-02 ENCOUNTER — Inpatient Hospital Stay: Payer: 59

## 2020-03-02 ENCOUNTER — Other Ambulatory Visit: Payer: Self-pay

## 2020-03-02 ENCOUNTER — Encounter (HOSPITAL_COMMUNITY): Payer: Self-pay

## 2020-03-02 ENCOUNTER — Ambulatory Visit (HOSPITAL_COMMUNITY)
Admission: RE | Admit: 2020-03-02 | Discharge: 2020-03-02 | Disposition: A | Discharge status: Home / Self Care | Payer: 59 | Source: Ambulatory Visit | Attending: Internal Medicine | Admitting: Internal Medicine

## 2020-03-02 DIAGNOSIS — C3412 Malignant neoplasm of upper lobe, left bronchus or lung: Secondary | ICD-10-CM | POA: Insufficient documentation

## 2020-03-02 DIAGNOSIS — K2100 Gastro-esophageal reflux disease with esophagitis, without bleeding: Secondary | ICD-10-CM | POA: Insufficient documentation

## 2020-03-02 DIAGNOSIS — C3490 Malignant neoplasm of unspecified part of unspecified bronchus or lung: Secondary | ICD-10-CM | POA: Insufficient documentation

## 2020-03-02 DIAGNOSIS — I10 Essential (primary) hypertension: Secondary | ICD-10-CM | POA: Insufficient documentation

## 2020-03-02 DIAGNOSIS — Z923 Personal history of irradiation: Secondary | ICD-10-CM | POA: Insufficient documentation

## 2020-03-02 DIAGNOSIS — Z79899 Other long term (current) drug therapy: Secondary | ICD-10-CM | POA: Insufficient documentation

## 2020-03-02 DIAGNOSIS — C321 Malignant neoplasm of supraglottis: Secondary | ICD-10-CM

## 2020-03-02 DIAGNOSIS — F419 Anxiety disorder, unspecified: Secondary | ICD-10-CM | POA: Insufficient documentation

## 2020-03-02 DIAGNOSIS — Z9221 Personal history of antineoplastic chemotherapy: Secondary | ICD-10-CM | POA: Insufficient documentation

## 2020-03-02 DIAGNOSIS — J449 Chronic obstructive pulmonary disease, unspecified: Secondary | ICD-10-CM | POA: Insufficient documentation

## 2020-03-02 DIAGNOSIS — Z86010 Personal history of colonic polyps: Secondary | ICD-10-CM | POA: Insufficient documentation

## 2020-03-02 DIAGNOSIS — Z95828 Presence of other vascular implants and grafts: Secondary | ICD-10-CM

## 2020-03-02 DIAGNOSIS — Z8521 Personal history of malignant neoplasm of larynx: Secondary | ICD-10-CM | POA: Insufficient documentation

## 2020-03-02 DIAGNOSIS — R634 Abnormal weight loss: Secondary | ICD-10-CM | POA: Insufficient documentation

## 2020-03-02 LAB — CMP (CANCER CENTER ONLY)
ALT: 11 U/L (ref 0–44)
AST: 15 U/L (ref 15–41)
Albumin: 3.6 g/dL (ref 3.5–5.0)
Alkaline Phosphatase: 100 U/L (ref 38–126)
Anion gap: 10 (ref 5–15)
BUN: 9 mg/dL (ref 6–20)
CO2: 26 mmol/L (ref 22–32)
Calcium: 8.9 mg/dL (ref 8.9–10.3)
Chloride: 103 mmol/L (ref 98–111)
Creatinine: 0.64 mg/dL (ref 0.61–1.24)
GFR, Est AFR Am: >60 mL/min (ref >60)
GFR, Estimated: >60 mL/min (ref >60)
Glucose, Bld: 86 mg/dL (ref 70–99)
Potassium: 4.3 mmol/L (ref 3.5–5.1)
Sodium: 139 mmol/L (ref 135–145)
Total Bilirubin: 0.6 mg/dL (ref 0.3–1.2)
Total Protein: 6.5 g/dL (ref 6.5–8.1)

## 2020-03-02 LAB — CBC WITH DIFFERENTIAL (CANCER CENTER ONLY)
Abs Immature Granulocytes: 0.02 10*3/uL (ref 0.00–0.07)
Basophils Absolute: 0 10*3/uL (ref 0.0–0.1)
Basophils Relative: 1 %
Eosinophils Absolute: 0.1 10*3/uL (ref 0.0–0.5)
Eosinophils Relative: 2 %
HCT: 48.2 % (ref 39.0–52.0)
Hemoglobin: 16 g/dL (ref 13.0–17.0)
Immature Granulocytes: 0 %
Lymphocytes Relative: 9 %
Lymphs Abs: 0.7 10*3/uL (ref 0.7–4.0)
MCH: 32 pg (ref 26.0–34.0)
MCHC: 33.2 g/dL (ref 30.0–36.0)
MCV: 96.4 fL (ref 80.0–100.0)
Monocytes Absolute: 0.5 10*3/uL (ref 0.1–1.0)
Monocytes Relative: 7 %
Neutro Abs: 6.2 10*3/uL (ref 1.7–7.7)
Neutrophils Relative %: 81 %
Platelet Count: 279 10*3/uL (ref 150–400)
RBC: 5 MIL/uL (ref 4.22–5.81)
RDW: 13.6 % (ref 11.5–15.5)
WBC Count: 7.6 10*3/uL (ref 4.0–10.5)
nRBC: 0 % (ref 0.0–0.2)

## 2020-03-02 MED ORDER — SODIUM CHLORIDE 0.9% FLUSH
10.0000 mL | Freq: Once | INTRAVENOUS | Status: AC
  Administered 2020-03-02: 10 mL
  Filled 2020-03-02: qty 10

## 2020-03-02 MED ORDER — HEPARIN SOD (PORK) LOCK FLUSH 100 UNIT/ML IV SOLN
250.0000 [IU] | Freq: Once | INTRAVENOUS | Status: AC
  Administered 2020-03-02: 250 [IU]
  Filled 2020-03-02: qty 5

## 2020-03-02 MED ORDER — IOHEXOL 300 MG/ML  SOLN
75.0000 mL | Freq: Once | INTRAMUSCULAR | Status: AC | PRN
  Administered 2020-03-02: 75 mL via INTRAVENOUS

## 2020-03-02 MED ORDER — SODIUM CHLORIDE (PF) 0.9 % IJ SOLN
INTRAMUSCULAR | Status: AC
  Filled 2020-03-02: qty 50

## 2020-03-05 ENCOUNTER — Encounter: Payer: Self-pay | Admitting: *Deleted

## 2020-03-05 ENCOUNTER — Other Ambulatory Visit: Payer: Self-pay

## 2020-03-05 ENCOUNTER — Encounter: Payer: Self-pay | Admitting: Internal Medicine

## 2020-03-05 ENCOUNTER — Telehealth: Payer: Self-pay | Admitting: Internal Medicine

## 2020-03-05 ENCOUNTER — Inpatient Hospital Stay (HOSPITAL_BASED_OUTPATIENT_CLINIC_OR_DEPARTMENT_OTHER): Payer: 59 | Admitting: Internal Medicine

## 2020-03-05 VITALS — BP 131/79 | HR 83 | Temp 97.8°F | Resp 18 | Ht 71.0 in | Wt 151.4 lb

## 2020-03-05 DIAGNOSIS — Z79899 Other long term (current) drug therapy: Secondary | ICD-10-CM | POA: Diagnosis not present

## 2020-03-05 DIAGNOSIS — I10 Essential (primary) hypertension: Secondary | ICD-10-CM | POA: Diagnosis not present

## 2020-03-05 DIAGNOSIS — R634 Abnormal weight loss: Secondary | ICD-10-CM | POA: Diagnosis not present

## 2020-03-05 DIAGNOSIS — C321 Malignant neoplasm of supraglottis: Secondary | ICD-10-CM

## 2020-03-05 DIAGNOSIS — C3490 Malignant neoplasm of unspecified part of unspecified bronchus or lung: Secondary | ICD-10-CM | POA: Diagnosis not present

## 2020-03-05 DIAGNOSIS — J449 Chronic obstructive pulmonary disease, unspecified: Secondary | ICD-10-CM | POA: Diagnosis not present

## 2020-03-05 DIAGNOSIS — Z9221 Personal history of antineoplastic chemotherapy: Secondary | ICD-10-CM | POA: Diagnosis not present

## 2020-03-05 DIAGNOSIS — K2100 Gastro-esophageal reflux disease with esophagitis, without bleeding: Secondary | ICD-10-CM | POA: Diagnosis not present

## 2020-03-05 DIAGNOSIS — Z923 Personal history of irradiation: Secondary | ICD-10-CM | POA: Diagnosis not present

## 2020-03-05 DIAGNOSIS — C3412 Malignant neoplasm of upper lobe, left bronchus or lung: Secondary | ICD-10-CM

## 2020-03-05 DIAGNOSIS — Z86010 Personal history of colonic polyps: Secondary | ICD-10-CM | POA: Diagnosis not present

## 2020-03-05 DIAGNOSIS — Z8521 Personal history of malignant neoplasm of larynx: Secondary | ICD-10-CM | POA: Diagnosis not present

## 2020-03-05 DIAGNOSIS — F419 Anxiety disorder, unspecified: Secondary | ICD-10-CM | POA: Diagnosis not present

## 2020-03-05 NOTE — Telephone Encounter (Signed)
Scheduled per los. Gave avs and calendar  

## 2020-03-05 NOTE — Progress Notes (Signed)
[place] [phone]   [phone]  OFFICE PROGRESS NOTE  [name], [name], MD [place] [address]  DIAGNOSIS:  1) Stage IIB/IV (T3, N3, M1a) non-small cell lung cancer, squamous cell carcinoma presented with large left hilar mass in addition to mediastinal and left supraclavicular lymphadenopathy as well as contralateral right upper lobe nodule diagnosed in August 2018. PDL 1 expression: 90%. 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 First dose 06/29/2017. Status post 6 cycles. Last dose was given 08/03/2017. 2)  Immunotherapy with Ketruda 200 MG IV every 3 weeks, first dose 09/17/2017.  Status post 35 cycles.  CURRENT THERAPY: Observation.  INTERVAL HISTORY: Trevor Zavala 54 y.o. male returns to the clinic today for follow-up visit.  The patient is feeling fine today with no concerning complaints.  He denied having any chest pain, shortness of breath, cough or hemoptysis.  He denied having any fever or chills.  He has no nausea, vomiting, diarrhea or constipation.  He denied having any headache or visual changes.  The patient has no significant weight loss or night sweats.  He had repeat CT scan of the chest performed recently and he is here for evaluation and discussion of his scan results.   MEDICAL HISTORY: Past Medical History:  Diagnosis Date  . Anxiety   . COPD (chronic obstructive pulmonary disease) ([place])   . GERD (gastroesophageal reflux disease)   . Headache   . Hypertension   . Pneumonia   . Situational depression   . Stage III squamous cell carcinoma of left lung ([place]) 06/19/2017   Lungs & Epiglottis  . Tubular adenoma of colon 01/2015  . Tubular adenoma of colon 01/2015    ALLERGIES:  is allergic to escitalopram oxalate.  MEDICATIONS:  Current Outpatient  Medications  Medication Sig Dispense Refill  . acyclovir (ZOVIRAX) 400 MG tablet Take 1 tablet (400 mg total) by mouth 3 (three) times daily. 21 tablet 3  . azithromycin (ZITHROMAX) 250 MG tablet 2 tab by mouth day 1, then 1 per day 6 tablet 0  . BREO ELLIPTA 100-25 MCG/INH AEPB TAKE 1 PUFF BY MOUTH EVERY DAY 180 each 3  . clonazePAM (KLONOPIN) 0.5 MG tablet TAKE 1 OR 2 TABLETS BY MOUTH TWICE A DAY AS NEEDED FOR ANXIETY 180 tablet 0  . diphenhydrAMINE (BENADRYL) 25 MG tablet Take 12.5 mg by mouth every 6 (six) hours as needed for itching.    . esomeprazole (NEXIUM) 40 MG capsule TAKE 1 CAPSULE BY MOUTH EVERY DAY IN THE MORNING 90 capsule 3  . fexofenadine-pseudoephedrine (ALLEGRA-D 24) 180-240 MG 24 hr tablet Take 1 tablet by mouth daily.    . Multiple Vitamin (MULTIVITAMIN WITH MINERALS) TABS tablet Take 1 tablet by mouth daily.    . pembrolizumab (KEYTRUDA) 100 MG/4ML SOLN Inject 2 mg/kg into the vein. 200mg  every 3 weeks.     No current facility-administered medications for this visit.    SURGICAL HISTORY:  Past Surgical History:  Procedure Laterality Date  . COLONOSCOPY    . DIRECT LARYNGOSCOPY N/A 06/25/2017   Procedure: DIRECT LARYNGOSCOPY AND BIOPSY;  Surgeon: Rozetta Nunnery, MD;  Location: [facility];  Service: ENT;  Laterality: N/A;  . EAR CYST EXCISION N/A 11/17/2013   Procedure: SEBACEOUS CYST CHEST;  Surgeon: Joyice Faster. Cornett, MD;  Location: [facility];  Service: General;  Laterality: N/A;  . IR GASTROSTOMY TUBE MOD SED  08/10/2017  . IR GASTROSTOMY TUBE REMOVAL  09/29/2017  . KNEE ARTHROSCOPY     LEFT  . LIPOMA EXCISION N/A 11/17/2013   Procedure: EXCISION LIPOMA FOREHEAD;  Surgeon: Joyice Faster. Cornett, MD;  Location: [facility];  Service: General;  Laterality: N/A;  . LUNG BIOPSY Bilateral 06/12/2017   Procedure: LEFT LUNG BIOPSY;  Surgeon: Grace Isaac, MD;  Location: [facility];  Service: Thoracic;  Laterality: Bilateral;  .  MICROLARYNGOSCOPY W/VOCAL CORD INJECTION Left 07/16/2018   Procedure: suspened micro laryngoscopy with jet ventilation and prolaryn injection;  Surgeon: Melida Quitter, MD;  Location: [facility];  Service: ENT;  Laterality: Left;  . PORTACATH PLACEMENT Right 07/01/2017   Procedure: INSERTION PORT-A-CATH - RIGHT IJ - placed with Fluoro and Ultrasound;  Surgeon: Grace Isaac, MD;  Location: [facility];  Service: Thoracic;  Laterality: Right;  Marland Kitchen VIDEO BRONCHOSCOPY WITH ENDOBRONCHIAL ULTRASOUND N/A 06/12/2017   Procedure: VIDEO BRONCHOSCOPY WITH ENDOBRONCHIAL ULTRASOUND;  Surgeon: Grace Isaac, MD;  Location: [facility];  Service: Thoracic;  Laterality: N/A;    REVIEW OF SYSTEMS:  A comprehensive review of systems was negative.   PHYSICAL EXAMINATION: General appearance: alert, cooperative and no distress Head: Normocephalic, without obvious abnormality, atraumatic Neck: no adenopathy, no JVD, supple, symmetrical, trachea midline and thyroid not enlarged, symmetric, no tenderness/mass/nodules Lymph nodes: Cervical, supraclavicular, and axillary nodes normal. Resp: clear to auscultation bilaterally Back: symmetric, no curvature. ROM normal. No CVA tenderness. Cardio: regular rate and rhythm, S1, S2 normal, no murmur, click, rub or gallop GI: soft, non-tender; bowel sounds normal; no masses,  no organomegaly Extremities: extremities normal, atraumatic, no cyanosis or edema  ECOG PERFORMANCE STATUS: 0 - Asymptomatic  Blood pressure 131/79, pulse 83, temperature 97.8 F (36.6 C), temperature source Oral, resp. rate 18, height 5\' 11"  (1.803 m), weight 151 lb 6.4 oz (68.7 kg), SpO2 100 %.  LABORATORY DATA: Lab Results  Component Value Date   WBC 7.6 03/02/2020   HGB 16.0 03/02/2020   HCT 48.2 03/02/2020   MCV 96.4 03/02/2020   PLT 279 03/02/2020      Chemistry      Component Value Date/Time   NA 139 03/02/2020 1212   NA 141 10/29/2017 1049   K 4.3 03/02/2020 1212   K 3.3 (L) 10/29/2017 1049    CL 103 03/02/2020 1212   CO2 26 03/02/2020 1212   CO2 26 10/29/2017 1049   BUN 9 03/02/2020 1212   BUN 11.2 10/29/2017 1049   CREATININE 0.64 03/02/2020 1212   CREATININE 0.7 10/29/2017 1049   GLU 198 08/25/2017 0000      Component Value Date/Time   CALCIUM 8.9 03/02/2020 1212   CALCIUM 9.0 10/29/2017 1049   ALKPHOS 100 03/02/2020 1212   ALKPHOS 82 10/29/2017 1049   AST 15 03/02/2020 1212   AST 10 10/29/2017 1049   ALT 11 03/02/2020 1212   ALT 8 10/29/2017 1049   BILITOT 0.6 03/02/2020 1212   BILITOT 0.22 10/29/2017 1049       RADIOGRAPHIC STUDIES: CT Chest W Contrast  Result Date: 03/04/2020 CLINICAL DATA:  Non-small cell lung cancer, epiglottic cancer, status post chemotherapy, XRT, immunotherapy EXAM: CT CHEST WITH CONTRAST TECHNIQUE: Multidetector CT imaging of the chest was performed during intravenous contrast administration. CONTRAST:  52mL OMNIPAQUE IOHEXOL 300 MG/ML  SOLN COMPARISON:  CT chest, 12/02/2019, PET-CT, 08/29/2019 FINDINGS: Cardiovascular: Right chest port catheter. Aortic  atherosclerosis. Normal heart size. Scattered coronary artery calcifications. No pericardial effusion. Mediastinum/Nodes: Unchanged soft tissue thickening about the left hilum. No discretely enlarged mediastinal, hilar, or axillary lymph nodes. Thyroid gland, trachea, and esophagus demonstrate no significant findings. Lungs/Pleura: Mild centrilobular emphysema. Mild, diffuse bilateral bronchial wall thickening. Unchanged post treatment appearance of a suprahilar left upper lobe mass. Slightly diminished adjacent nodules of the apical left upper lobe, measuring 0.9 cm, previously 1.3 cm and 0.9 cm, previously 1.1 cm (series 7, image 40, 39). Interval resolution of consolidation of the medial segment right middle lobe (series 7, image 122). New subtle ground-glass opacity of the anterior left lower lobe (series 7, image 107). Unchanged small, loculated left pleural effusion. Upper Abdomen: No acute  abnormality. Unchanged small left adrenal nodule, previously not FDG avid and likely a benign adenoma. Musculoskeletal: No chest wall mass or suspicious bone lesions identified. IMPRESSION: 1. Slightly diminished adjacent nodules of the apical left upper lobe, and resolution of consolidation of the medial segment right middle lobe. There is new, subtle ground-glass opacity of the anterior left lower lobe. As on prior examination, constellation of fluctuating findings generally suggests nonspecific ongoing infection or inflammation. 2. Unchanged post treatment appearance of a perihilar left upper lobe mass and soft tissue about the left hilum. 3.  Unchanged small, loculated left pleural effusion. 4.  Emphysema (ICD10-J43.9). 5.  Aortic Atherosclerosis (ICD10-I70.0). Electronically Signed   By: Eddie Candle M.D.   On: 03/04/2020 16:39    ASSESSMENT AND PLAN: This is a very pleasant 54 years old white male with recently diagnosed stage IIIB/IV non-small cell lung cancer, adenocarcinoma presented with large left hilar mass in addition to mediastinal and left supraclavicular lymphadenopathy as well as suspicious right upper lobe pulmonary nodule diagnosed in August 2018. 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 He underwent a course of concurrent chemoradiation to the lung as well as the epiglottic area under the care of Dr. Tammi Klippel. He is status post 6 cycle. He tolerated this course of treatment well except for the radiation induced esophagitis as well as weight loss and fatigue. The patient had partial response to the previous treatment. He completed treatment with Keytruda 200 mg IV every 3 weeks status post 35 cycles. He tolerated this treatment well with no concerning adverse effects. He is currently on observation.  The patient has no complaints today. He had repeat CT scan of the chest performed recently.  I personally and independently reviewed the scans  and discussed the results with the patient today. His scan showed no concerning findings for disease recurrence or metastasis. I recommended for him to continue on observation with repeat CT scan of the chest in 4 months. For the IV access, he will continue to have Port-A-Cath flush every 2 months. The patient was advised to call immediately if he has any concerning symptoms in the interval. The patient voices understanding of current disease status and treatment options and is in agreement with the current care plan. All questions were answered. The patient knows to call the clinic with any problems, questions or concerns. We can certainly see the patient much sooner if necessary. Disclaimer: This note was dictated with voice recognition software. Similar sounding words can inadvertently be transcribed and may not be corrected upon review.

## 2020-03-05 NOTE — Progress Notes (Signed)
Oncology Nurse Navigator Documentation  Oncology Nurse Navigator Flowsheets 03/05/2020  Abnormal Finding Date -  Confirmed Diagnosis Date -  Navigator Location [facility]  Referral Date to RadOnc/MedOnc -  Navigator Encounter Type Clinic/MDC  Telephone -  [facility] Date -  Multidisiplinary Clinic Type -  Treatment Initiated Date -  Patient Visit Type MedOnc  Treatment Phase Other  Barriers/Navigation Needs No Barriers At This Time  Education -  Interventions Psycho-Social Support  Acuity Level 2-Minimal Needs (1-2 Barriers Identified)  Coordination of Care -  Education Method -  Time Spent with Patient 15

## 2020-04-06 ENCOUNTER — Encounter: Payer: Self-pay | Admitting: *Deleted

## 2020-04-25 ENCOUNTER — Telehealth: Payer: Self-pay | Admitting: *Deleted

## 2020-04-25 NOTE — Telephone Encounter (Signed)
I received a call from patient.  I gave him the number to call for him to schedule his scan.  He verbalized understanding.

## 2020-04-25 NOTE — Telephone Encounter (Signed)
I received a call from Mr. Trevor Zavala about his schedule.  I called but was unable to reach him. I did leave vm message with my name and phone number to call.

## 2020-05-04 ENCOUNTER — Inpatient Hospital Stay: Payer: 59 | Attending: Internal Medicine

## 2020-05-04 ENCOUNTER — Other Ambulatory Visit: Payer: Self-pay

## 2020-05-04 ENCOUNTER — Encounter: Payer: Self-pay | Admitting: Family

## 2020-05-04 ENCOUNTER — Telehealth: Payer: Self-pay | Admitting: Family

## 2020-05-04 ENCOUNTER — Ambulatory Visit: Payer: 59 | Admitting: Family

## 2020-05-04 VITALS — BP 120/72 | HR 92 | Temp 98.4°F | Wt 146.0 lb

## 2020-05-04 DIAGNOSIS — J449 Chronic obstructive pulmonary disease, unspecified: Secondary | ICD-10-CM

## 2020-05-04 DIAGNOSIS — R933 Abnormal findings on diagnostic imaging of other parts of digestive tract: Secondary | ICD-10-CM

## 2020-05-04 DIAGNOSIS — C3412 Malignant neoplasm of upper lobe, left bronchus or lung: Secondary | ICD-10-CM | POA: Diagnosis present

## 2020-05-04 DIAGNOSIS — Z452 Encounter for adjustment and management of vascular access device: Secondary | ICD-10-CM | POA: Insufficient documentation

## 2020-05-04 DIAGNOSIS — C321 Malignant neoplasm of supraglottis: Secondary | ICD-10-CM

## 2020-05-04 DIAGNOSIS — Z95828 Presence of other vascular implants and grafts: Secondary | ICD-10-CM

## 2020-05-04 LAB — BASIC METABOLIC PANEL
BUN: 6 mg/dL (ref 6–23)
CO2: 29 mEq/L (ref 19–32)
Calcium: 9.7 mg/dL (ref 8.4–10.5)
Chloride: 101 mEq/L (ref 96–112)
Creatinine, Ser: 0.79 mg/dL (ref 0.40–1.50)
GFR: 101.61 mL/min (ref >60.00)
Glucose, Bld: 173 mg/dL — ABNORMAL HIGH (ref 70–99)
Potassium: 5 mEq/L (ref 3.5–5.1)
Sodium: 138 mEq/L (ref 135–145)

## 2020-05-04 MED ORDER — SODIUM CHLORIDE 0.9% FLUSH
10.0000 mL | Freq: Once | INTRAVENOUS | Status: AC
  Administered 2020-05-04: 10 mL
  Filled 2020-05-04: qty 10

## 2020-05-04 MED ORDER — HEPARIN SOD (PORK) LOCK FLUSH 100 UNIT/ML IV SOLN
500.0000 [IU] | Freq: Once | INTRAVENOUS | Status: AC
  Administered 2020-05-04: 500 [IU]
  Filled 2020-05-04: qty 5

## 2020-05-04 NOTE — Progress Notes (Signed)
Trevor Zavala is a 54 y.o. male with the following history as recorded in EpicCare:  Patient Active Problem List   Diagnosis Date Noted  . Acute upper respiratory infection 11/26/2019  . Anxiety and depression 01/25/2018  . Hoarseness of voice 01/25/2018  . Encounter for antineoplastic immunotherapy 10/08/2017  . Cancer, epiglottis ([place])   . Protein-calorie malnutrition, severe 08/09/2017  . Protein-calorie malnutrition, moderate ([place]) 08/07/2017  . Hypokalemia 08/07/2017  . SOB (shortness of breath) 08/07/2017  . Dehydration 08/07/2017  . Port catheter in place 08/03/2017  . Squamous cell carcinoma of epiglottis ([facility]) 07/06/2017  . Stage III squamous cell carcinoma of left lung ([facility]) 06/19/2017  . Encounter for antineoplastic chemotherapy 06/19/2017  . Goals of care, counseling/discussion 06/19/2017  . Lung mass 06/08/2017  . Hemoptysis 06/01/2017  . Weight loss 06/01/2017  . Colon polyps 01/16/2017  . Essential hypertension 01/16/2017  . Generalized anxiety disorder 06/22/2015  . Situational depression 02/03/2014  . Post-operative state 12/02/2013  . Infected epidermoid cyst 10/28/2013  . Lipoma of face 10/18/2013  . Well adult exam 10/07/2013  . Hand eczema 10/07/2013  . Wheezing on auscultation 06/01/2012  . Chest pain 06/01/2012  . Back pain 06/01/2012  . Sebaceous cyst 04/05/2012  . Contracture of palmar fascia (Dupuytren's) 04/05/2012  . Lipoma of forehead 04/05/2012  . Cold sore 03/19/2012  . Rhinitis, chronic 08/22/2011  . OTITIS MEDIA, MUCOID, CHRONIC 12/31/2010  . Dysphagia 08/23/2010  . TOBACCO USER 07/10/2010  . COPD mixed type ([place]) 07/10/2010  . GERD 07/10/2010  . Cough 07/10/2010    Current Outpatient Medications  Medication Sig Dispense Refill  . acyclovir (ZOVIRAX) 400 MG tablet Take 1 tablet (400 mg total) by mouth 3 (three) times daily. 21 tablet 3  . clonazePAM (KLONOPIN) 0.5 MG tablet TAKE 1 OR 2 TABLETS BY MOUTH TWICE A DAY AS NEEDED FOR  ANXIETY 180 tablet 0  . diphenhydrAMINE (BENADRYL) 25 MG tablet Take 12.5 mg by mouth every 6 (six) hours as needed for itching.    . esomeprazole (NEXIUM) 40 MG capsule TAKE 1 CAPSULE BY MOUTH EVERY DAY IN THE MORNING 90 capsule 3  . fexofenadine-pseudoephedrine (ALLEGRA-D 24) 180-240 MG 24 hr tablet Take 1 tablet by mouth daily.    . Multiple Vitamin (MULTIVITAMIN WITH MINERALS) TABS tablet Take 1 tablet by mouth daily.    . pembrolizumab (KEYTRUDA) 100 MG/4ML SOLN Inject 2 mg/kg into the vein. 200mg  every 3 weeks.     No current facility-administered medications for this visit.    Allergies: Escitalopram oxalate  Past Medical History:  Diagnosis Date  . Anxiety   . COPD (chronic obstructive pulmonary disease) ([place])   . GERD (gastroesophageal reflux disease)   . Headache   . Hypertension   . Pneumonia   . Situational depression   . Stage III squamous cell carcinoma of left lung ([place]) 06/19/2017   Lungs & Epiglottis  . Tubular adenoma of colon 01/2015  . Tubular adenoma of colon 01/2015    Past Surgical History:  Procedure Laterality Date  . COLONOSCOPY    . DIRECT LARYNGOSCOPY N/A 06/25/2017   Procedure: DIRECT LARYNGOSCOPY AND BIOPSY;  Surgeon: Rozetta Nunnery, MD;  Location: [facility];  Service: ENT;  Laterality: N/A;  . EAR CYST EXCISION N/A 11/17/2013   Procedure: SEBACEOUS CYST CHEST;  Surgeon: Joyice Faster. Cornett, MD;  Location: [facility];  Service: General;  Laterality: N/A;  . IR GASTROSTOMY TUBE MOD SED  08/10/2017  . IR  GASTROSTOMY TUBE REMOVAL  09/29/2017  . KNEE ARTHROSCOPY     LEFT  . LIPOMA EXCISION N/A 11/17/2013   Procedure: EXCISION LIPOMA FOREHEAD;  Surgeon: Joyice Faster. Cornett, MD;  Location: [facility];  Service: General;  Laterality: N/A;  . LUNG BIOPSY Bilateral 06/12/2017   Procedure: LEFT LUNG BIOPSY;  Surgeon: Grace Isaac, MD;  Location: [facility];  Service: Thoracic;  Laterality: Bilateral;  .  MICROLARYNGOSCOPY W/VOCAL CORD INJECTION Left 07/16/2018   Procedure: suspened micro laryngoscopy with jet ventilation and prolaryn injection;  Surgeon: Melida Quitter, MD;  Location: [facility];  Service: ENT;  Laterality: Left;  . PORTACATH PLACEMENT Right 07/01/2017   Procedure: INSERTION PORT-A-CATH - RIGHT IJ - placed with Fluoro and Ultrasound;  Surgeon: Grace Isaac, MD;  Location: [facility];  Service: Thoracic;  Laterality: Right;  Marland Kitchen VIDEO BRONCHOSCOPY WITH ENDOBRONCHIAL ULTRASOUND N/A 06/12/2017   Procedure: VIDEO BRONCHOSCOPY WITH ENDOBRONCHIAL ULTRASOUND;  Surgeon: Grace Isaac, MD;  [facility];  Service: Thoracic;  Laterality: N/A;    Family History  Problem Relation Age of Onset  . Breast cancer Mother   . Hypertension Mother   . Colon cancer Mother   . Skin cancer Father   . Hypertension Father   . Colon polyps Father   . Cancer Father 56       b cell lymphoma  . Colon cancer Father   . Rectal cancer Neg Hx     Social History   Tobacco Use  . Smoking status: Former Smoker    Packs/day: 0.25    Years: 35.00    Pack years: 8.75    Quit date: 08/10/2017    Years since quitting: 2.7  . Smokeless tobacco: Never Used  . Tobacco comment: down to 4 cig/day01/08/2018   Substance Use Topics  . Alcohol use: Yes    Alcohol/week: 0.0 standard drinks    Comment: SOCIAL    Subjective:   Patient presents with concerns for "lump" on his right upper abdomen/ flank; thinks area has been present x 4 weeks; is actively undergoing cancer treatment- has not discussed with oncologist; no abdominal pain; no changes in bowel habits;  Also concerned about cost of BREO and wonders about other alternative to treat his COPD;   Objective:  Vitals:   05/04/20 1114  BP: 120/72  Pulse: 92  Temp: 98.4 F (36.9 C)  TempSrc: Oral  SpO2: 95%  Weight: 146 lb (66.2 kg)    General: Well developed, well nourished, in no acute distress  Skin : Warm and dry.  Head: Normocephalic and  atraumatic  Lungs: Respirations unlabored;  Abdomen: Soft; nontender; nondistended; soft, movable lesion noted on right flank;  Extremities: No edema, cyanosis, clubbing  Vessels: Symmetric bilaterally  Neurologic: Alert and oriented; speech intact; face symmetrical; moves all extremities well; CNII-XII intact without focal deficit   Assessment:  1. Abnormal findings on diagnostic imaging of other parts of digestive tract   2. COPD mixed type ([place])     Plan:  1. ? Lipoma vs cyst; due to underlying health issues however, do feel imaging is warranted; will order CT and may need to consider MRI; follow-up to be determined; 2. Trial of Breztri- patient is given a Zero pay card; he will call back with his response.  This visit occurred during the SARS-CoV-2 public health emergency.  Safety protocols were in place, including screening questions prior to the visit, additional usage of staff PPE, and extensive cleaning of exam room  while observing appropriate contact time as indicated for disinfecting solutions.    No follow-ups on file.  Orders Placed This Encounter  Procedures  . CT Abdomen Pelvis W Contrast    Standing Status:   Future    Standing Expiration Date:   05/04/2021    Scheduling Instructions:     Requesting appointment on Friday    Order Specific Question:   ** REASON FOR EXAM (FREE TEXT)    Answer:   Lesion noted on right upper abdomen/ flank- history of cancer; rule out lipoma or cyst/    Order Specific Question:   If indicated for the ordered procedure, I authorize the administration of contrast media per Radiology protocol    Answer:   Yes    Order Specific Question:   Preferred imaging location?    Answer:   [id number]    Order Specific Question:   Is Oral Contrast requested for this exam?    Answer:   Yes, Per Radiology protocol    Order Specific Question:   Radiology Contrast Protocol - do NOT remove file path    Answer:    [email or web address]  . Basic Metabolic Panel (BMET)    Requested Prescriptions    No prescriptions requested or ordered in this encounter

## 2020-05-04 NOTE — Telephone Encounter (Signed)
Notified pt w/Laura response.Marland KitchenJohny Zavala

## 2020-05-04 NOTE — Telephone Encounter (Signed)
Please let him know that I was mistaken about the dosage on the [facility]. It is actually 2 puffs twice a day and he needs to rinse his mouth out after using. I am sorry for the confusion.

## 2020-05-11 ENCOUNTER — Ambulatory Visit
Admission: RE | Admit: 2020-05-11 | Discharge: 2020-05-11 | Disposition: A | Discharge status: Home / Self Care | Payer: 59 | Source: Ambulatory Visit | Attending: Family | Admitting: Family

## 2020-05-11 DIAGNOSIS — R933 Abnormal findings on diagnostic imaging of other parts of digestive tract: Secondary | ICD-10-CM

## 2020-05-11 MED ORDER — IOPAMIDOL (ISOVUE-300) INJECTION 61%
100.0000 mL | Freq: Once | INTRAVENOUS | Status: AC | PRN
  Administered 2020-05-11: 100 mL via INTRAVENOUS

## 2020-05-15 ENCOUNTER — Telehealth: Payer: Self-pay | Admitting: Internal Medicine

## 2020-05-15 ENCOUNTER — Other Ambulatory Visit: Payer: Self-pay | Admitting: Family

## 2020-05-15 DIAGNOSIS — R935 Abnormal findings on diagnostic imaging of other abdominal regions, including retroperitoneum: Secondary | ICD-10-CM

## 2020-05-15 NOTE — Telephone Encounter (Signed)
New message:   Pt is returning a call for test results. Please advise.

## 2020-05-16 ENCOUNTER — Encounter: Payer: Self-pay | Admitting: Family

## 2020-05-16 NOTE — Telephone Encounter (Signed)
F/u  The patient is calling checking on yesterday messages asking for a call back

## 2020-05-16 NOTE — Telephone Encounter (Signed)
New message:   Pt is calling back to get the results of his CT scan that Jodi Mourning ordered for him. Please advise.

## 2020-05-17 ENCOUNTER — Other Ambulatory Visit: Payer: Self-pay | Admitting: Internal Medicine

## 2020-05-17 NOTE — Telephone Encounter (Signed)
Pt called & given Laura/Dr [name]'s response below:  Per Mickel Baas: "Left message for patient to call back; radiologist is recommending ultrasound to better visualize the right flank nodule; nothing specific showed up on the CT; we will know better how to proceed once the ultrasound is done. Order has been placed. "  Pt aware that he will be receiving a phone call to schedule U/S; verb understanding.

## 2020-05-17 NOTE — Telephone Encounter (Signed)
Per Mickel Baas: "Left message for patient to call back; radiologist is recommending ultrasound to better visualize the right flank nodule; nothing specific showed up on the CT; we will know better how to proceed once the ultrasound is done. Order has been placed. " Thx

## 2020-05-18 NOTE — Telephone Encounter (Signed)
[place] Controlled Database Checked Last filled: 10/25/2019 (180) LOV w/you: 11/25/18 Next appt w/you: none  Pt has seen other providers since last visit with you.

## 2020-05-25 ENCOUNTER — Other Ambulatory Visit: Payer: 59

## 2020-06-08 ENCOUNTER — Other Ambulatory Visit: Payer: Self-pay | Admitting: Family

## 2020-06-08 ENCOUNTER — Ambulatory Visit
Admission: RE | Admit: 2020-06-08 | Discharge: 2020-06-08 | Disposition: A | Discharge status: Home / Self Care | Payer: 59 | Source: Ambulatory Visit | Attending: Family | Admitting: Family

## 2020-06-08 DIAGNOSIS — R935 Abnormal findings on diagnostic imaging of other abdominal regions, including retroperitoneum: Secondary | ICD-10-CM

## 2020-06-08 DIAGNOSIS — R599 Enlarged lymph nodes, unspecified: Secondary | ICD-10-CM

## 2020-06-08 NOTE — Telephone Encounter (Signed)
GI called and requested a verbal order to change the [place] to a soft tissue limited (looks more superficial) to follow up on the right flank lesion instead of a Abd Complete (looks deeper and at the organs)  Per Mickel Baas - verbal okay given to change order to [place] limited soft tissue.

## 2020-06-11 ENCOUNTER — Telehealth: Payer: Self-pay

## 2020-06-11 ENCOUNTER — Telehealth: Payer: Self-pay | Admitting: Medical Oncology

## 2020-06-11 DIAGNOSIS — C3490 Malignant neoplasm of unspecified part of unspecified bronchus or lung: Secondary | ICD-10-CM

## 2020-06-11 NOTE — Telephone Encounter (Signed)
New message    The patient called to say his  Oncology MD is requesting his PCP to set up his biopsy test.

## 2020-06-11 NOTE — Telephone Encounter (Signed)
He will need CT of the abdomen and pelvis with contrast for further evaluation of this area.  It can be done in a week or 2 before his visit or wait until the next scan.  Thank you.

## 2020-06-11 NOTE — Telephone Encounter (Signed)
R flank lesion-[name] ordered US limited soft tissue due to CT finding and wants pt to see  [facility].  Next appt ( and CT)  is September.  [place] result: "IMPRESSION: Subcentimeter right flank mixed echogenicity lesion. While this may reflect a lymph node, cannot exclude alternative pathologies.  Tissue sampling is recommended given patient history of malignancy."

## 2020-06-13 NOTE — Telephone Encounter (Signed)
Called pt and left vm informing him of referral that has already been put in and to wait until someone gets in contact with him.

## 2020-06-15 ENCOUNTER — Telehealth: Payer: Self-pay | Admitting: *Deleted

## 2020-06-15 NOTE — Telephone Encounter (Signed)
Patient asking about timing of the CT scan of abd/pelvis Dr. Julien Nordmann ordered. He wants to be sure MD saw the CT abd/pelvis done on 05/12/20 ordered by PCP office and if he needs another one now? Prefers to wait till chest CT on 07/13/20 if that is OK, or will the surgeon need another one for his biopsy?

## 2020-06-18 ENCOUNTER — Telehealth: Payer: Self-pay | Admitting: Medical Oncology

## 2020-06-18 NOTE — Telephone Encounter (Signed)
Pt notified to wait to do CT chest in September.

## 2020-06-29 ENCOUNTER — Telehealth: Payer: Self-pay | Admitting: Internal Medicine

## 2020-06-29 NOTE — Telephone Encounter (Signed)
Released last OV note to [facility] to [phone]   Release:  [id number]

## 2020-07-05 ENCOUNTER — Other Ambulatory Visit: Payer: Self-pay | Admitting: Internal Medicine

## 2020-07-05 DIAGNOSIS — C3412 Malignant neoplasm of upper lobe, left bronchus or lung: Secondary | ICD-10-CM

## 2020-07-06 ENCOUNTER — Other Ambulatory Visit: Payer: 59

## 2020-07-09 ENCOUNTER — Ambulatory Visit: Payer: 59 | Admitting: Internal Medicine

## 2020-07-11 ENCOUNTER — Other Ambulatory Visit: Payer: Self-pay | Admitting: Surgery

## 2020-07-13 ENCOUNTER — Inpatient Hospital Stay: Payer: 59

## 2020-07-13 ENCOUNTER — Inpatient Hospital Stay: Payer: 59 | Attending: Internal Medicine

## 2020-07-13 ENCOUNTER — Encounter (HOSPITAL_COMMUNITY): Payer: Self-pay

## 2020-07-13 ENCOUNTER — Other Ambulatory Visit: Payer: Self-pay | Admitting: Surgery

## 2020-07-13 ENCOUNTER — Ambulatory Visit (HOSPITAL_COMMUNITY)
Admission: RE | Admit: 2020-07-13 | Discharge: 2020-07-13 | Disposition: A | Discharge status: Home / Self Care | Payer: 59 | Source: Ambulatory Visit | Attending: Internal Medicine | Admitting: Internal Medicine

## 2020-07-13 ENCOUNTER — Other Ambulatory Visit: Payer: Self-pay

## 2020-07-13 DIAGNOSIS — C3490 Malignant neoplasm of unspecified part of unspecified bronchus or lung: Secondary | ICD-10-CM | POA: Diagnosis present

## 2020-07-13 DIAGNOSIS — Z95828 Presence of other vascular implants and grafts: Secondary | ICD-10-CM

## 2020-07-13 DIAGNOSIS — C3412 Malignant neoplasm of upper lobe, left bronchus or lung: Secondary | ICD-10-CM

## 2020-07-13 DIAGNOSIS — R1900 Intra-abdominal and pelvic swelling, mass and lump, unspecified site: Secondary | ICD-10-CM

## 2020-07-13 DIAGNOSIS — C321 Malignant neoplasm of supraglottis: Secondary | ICD-10-CM

## 2020-07-13 LAB — CBC WITH DIFFERENTIAL (CANCER CENTER ONLY)
Abs Immature Granulocytes: 0.04 10*3/uL (ref 0.00–0.07)
Basophils Absolute: 0 10*3/uL (ref 0.0–0.1)
Basophils Relative: 1 %
Eosinophils Absolute: 0.1 10*3/uL (ref 0.0–0.5)
Eosinophils Relative: 1 %
HCT: 46.4 % (ref 39.0–52.0)
Hemoglobin: 15.8 g/dL (ref 13.0–17.0)
Immature Granulocytes: 1 %
Lymphocytes Relative: 7 %
Lymphs Abs: 0.4 10*3/uL — ABNORMAL LOW (ref 0.7–4.0)
MCH: 33.1 pg (ref 26.0–34.0)
MCHC: 34.1 g/dL (ref 30.0–36.0)
MCV: 97.1 fL (ref 80.0–100.0)
Monocytes Absolute: 0.6 10*3/uL (ref 0.1–1.0)
Monocytes Relative: 10 %
Neutro Abs: 5 10*3/uL (ref 1.7–7.7)
Neutrophils Relative %: 80 %
Platelet Count: 214 10*3/uL (ref 150–400)
RBC: 4.78 MIL/uL (ref 4.22–5.81)
RDW: 11.9 % (ref 11.5–15.5)
WBC Count: 6.1 10*3/uL (ref 4.0–10.5)
nRBC: 0 % (ref 0.0–0.2)

## 2020-07-13 LAB — CMP (CANCER CENTER ONLY)
ALT: 6 U/L (ref 0–44)
AST: 13 U/L — ABNORMAL LOW (ref 15–41)
Albumin: 3.5 g/dL (ref 3.5–5.0)
Alkaline Phosphatase: 76 U/L (ref 38–126)
Anion gap: 8 (ref 5–15)
BUN: 8 mg/dL (ref 6–20)
CO2: 28 mmol/L (ref 22–32)
Calcium: 9.4 mg/dL (ref 8.9–10.3)
Chloride: 102 mmol/L (ref 98–111)
Creatinine: 0.62 mg/dL (ref 0.61–1.24)
GFR, Est AFR Am: >60 mL/min (ref >60)
GFR, Estimated: >60 mL/min (ref >60)
Glucose, Bld: 94 mg/dL (ref 70–99)
Potassium: 4.2 mmol/L (ref 3.5–5.1)
Sodium: 138 mmol/L (ref 135–145)
Total Bilirubin: 0.5 mg/dL (ref 0.3–1.2)
Total Protein: 6.5 g/dL (ref 6.5–8.1)

## 2020-07-13 MED ORDER — IOHEXOL 9 MG/ML PO SOLN
ORAL | Status: AC
  Filled 2020-07-13: qty 500

## 2020-07-13 MED ORDER — IOHEXOL 9 MG/ML PO SOLN
500.0000 mL | ORAL | Status: AC
  Administered 2020-07-13: 500 mL via ORAL

## 2020-07-13 MED ORDER — SODIUM CHLORIDE 0.9% FLUSH
10.0000 mL | Freq: Once | INTRAVENOUS | Status: AC
  Administered 2020-07-13: 10 mL
  Filled 2020-07-13: qty 10

## 2020-07-13 MED ORDER — HEPARIN SOD (PORK) LOCK FLUSH 100 UNIT/ML IV SOLN
500.0000 [IU] | Freq: Once | INTRAVENOUS | Status: AC
  Administered 2020-07-13: 500 [IU]
  Filled 2020-07-13: qty 5

## 2020-07-13 MED ORDER — IOHEXOL 300 MG/ML  SOLN
100.0000 mL | Freq: Once | INTRAMUSCULAR | Status: AC | PRN
  Administered 2020-07-13: 100 mL via INTRAVENOUS

## 2020-07-13 NOTE — Patient Instructions (Signed)

## 2020-07-13 NOTE — Progress Notes (Signed)
Patient is having a CT. Patient requested not to be left accessed because the big dressing pulls the hair off his chest. He would rather have an IV instead.

## 2020-07-17 ENCOUNTER — Inpatient Hospital Stay (HOSPITAL_BASED_OUTPATIENT_CLINIC_OR_DEPARTMENT_OTHER): Payer: 59 | Admitting: Internal Medicine

## 2020-07-17 ENCOUNTER — Encounter: Payer: Self-pay | Admitting: Internal Medicine

## 2020-07-17 ENCOUNTER — Other Ambulatory Visit: Payer: Self-pay

## 2020-07-17 VITALS — BP 115/74 | HR 71 | Temp 98.1°F | Resp 18 | Ht 71.0 in | Wt 147.3 lb

## 2020-07-17 DIAGNOSIS — I10 Essential (primary) hypertension: Secondary | ICD-10-CM

## 2020-07-17 DIAGNOSIS — C321 Malignant neoplasm of supraglottis: Secondary | ICD-10-CM | POA: Diagnosis not present

## 2020-07-17 DIAGNOSIS — C3490 Malignant neoplasm of unspecified part of unspecified bronchus or lung: Secondary | ICD-10-CM | POA: Diagnosis not present

## 2020-07-17 DIAGNOSIS — C3412 Malignant neoplasm of upper lobe, left bronchus or lung: Secondary | ICD-10-CM

## 2020-07-17 NOTE — Progress Notes (Signed)
[facility] [phone]   [phone]  OFFICE PROGRESS NOTE  [name], Trevor Lacks, MD [place] [address]  DIAGNOSIS:  1) Stage IIB/IV (T3, N3, M1a) non-small cell lung cancer, squamous cell carcinoma presented with large left hilar mass in addition to mediastinal and left supraclavicular lymphadenopathy as well as contralateral right upper lobe nodule diagnosed in August 2018. PDL 1 expression: 90%. 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 First dose 06/29/2017. Status post 6 cycles. Last dose was given 08/03/2017. 2)  Immunotherapy with Ketruda 200 MG IV every 3 weeks, first dose 09/17/2017.  Status post 35 cycles.  CURRENT THERAPY: Observation.  INTERVAL HISTORY: Trevor Zavala 54 y.o. male returns to the clinic today for follow-up visit.  His wife was available by phone during the visit.  The patient is feeling fine today with no concerning complaints.  He denied having any current chest pain, shortness of breath, cough or hemoptysis.  He denied having any fever or chills.  He has no nausea, vomiting, diarrhea or constipation.  He has no headache or visual changes.  The patient denied having any recent weight loss or night sweats.  He is very active and works at regular basis.  The patient had repeat CT scan of the neck, chest and abdomen performed recently and he is here for evaluation and discussion of his discuss results.  MEDICAL HISTORY: Past Medical History:  Diagnosis Date  . Anxiety   . COPD (chronic obstructive pulmonary disease) ([facility])   . GERD (gastroesophageal reflux disease)   . Headache   . Hypertension   . Pneumonia   . Situational depression   . Stage III squamous cell carcinoma of left lung ([facility]) 06/19/2017   Lungs & Epiglottis  . Tubular adenoma of colon 01/2015  .  Tubular adenoma of colon 01/2015    ALLERGIES:  is allergic to escitalopram oxalate.  MEDICATIONS:  Current Outpatient Medications  Medication Sig Dispense Refill  . acyclovir (ZOVIRAX) 400 MG tablet Take 1 tablet (400 mg total) by mouth 3 (three) times daily. 21 tablet 3  . clonazePAM (KLONOPIN) 0.5 MG tablet TAKE 1 OR 2 TABLETS BY MOUTH TWICE A DAY AS NEEDED FOR ANXIETY 180 tablet 0  . diphenhydrAMINE (BENADRYL) 25 MG tablet Take 12.5 mg by mouth every 6 (six) hours as needed for itching.    . esomeprazole (NEXIUM) 40 MG capsule TAKE 1 CAPSULE BY MOUTH EVERY DAY IN THE MORNING 90 capsule 3  . fexofenadine-pseudoephedrine (ALLEGRA-D 24) 180-240 MG 24 hr tablet Take 1 tablet by mouth daily.    . Multiple Vitamin (MULTIVITAMIN WITH MINERALS) TABS tablet Take 1 tablet by mouth daily.    . pembrolizumab (KEYTRUDA) 100 MG/4ML SOLN Inject 2 mg/kg into the vein. 200mg  every 3 weeks.     No current facility-administered medications for this visit.    SURGICAL HISTORY:  Past Surgical History:  Procedure Laterality Date  . COLONOSCOPY    . DIRECT LARYNGOSCOPY N/A 06/25/2017   Procedure: DIRECT LARYNGOSCOPY AND BIOPSY;  Surgeon: Rozetta Nunnery, MD;  Location: [facility];  Service: ENT;  Laterality: N/A;  . EAR CYST EXCISION N/A 11/17/2013   Procedure: SEBACEOUS CYST CHEST;  Surgeon: Joyice Faster. Cornett, MD;  Location: [facility];  Service: General;  Laterality: N/A;  . IR GASTROSTOMY  TUBE MOD SED  08/10/2017  . IR GASTROSTOMY TUBE REMOVAL  09/29/2017  . KNEE ARTHROSCOPY     LEFT  . LIPOMA EXCISION N/A 11/17/2013   Procedure: EXCISION LIPOMA FOREHEAD;  Surgeon: Joyice Faster. Cornett, MD;  Location: [facility];  Service: General;  Laterality: N/A;  . LUNG BIOPSY Bilateral 06/12/2017   Procedure: LEFT LUNG BIOPSY;  Surgeon: Grace Isaac, MD;  Location: [facility];  Service: Thoracic;  Laterality: Bilateral;  . MICROLARYNGOSCOPY W/VOCAL CORD INJECTION Left  07/16/2018   Procedure: suspened micro laryngoscopy with jet ventilation and prolaryn injection;  Surgeon: Melida Quitter, MD;  Location: [facility];  Service: ENT;  Laterality: Left;  . PORTACATH PLACEMENT Right 07/01/2017   Procedure: INSERTION PORT-A-CATH - RIGHT IJ - placed with Fluoro and Ultrasound;  Surgeon: Grace Isaac, MD;  Location: [facility];  Service: Thoracic;  Laterality: Right;  Marland Kitchen VIDEO BRONCHOSCOPY WITH ENDOBRONCHIAL ULTRASOUND N/A 06/12/2017   Procedure: VIDEO BRONCHOSCOPY WITH ENDOBRONCHIAL ULTRASOUND;  Surgeon: Grace Isaac, MD;  Location: [facility];  Service: Thoracic;  Laterality: N/A;    REVIEW OF SYSTEMS:  Constitutional: positive for fatigue Eyes: negative Ears, nose, mouth, throat, and face: negative Respiratory: negative Cardiovascular: negative Gastrointestinal: negative Genitourinary:negative Integument/breast: negative Hematologic/lymphatic: negative Musculoskeletal:negative Neurological: negative Behavioral/Psych: negative Endocrine: negative Allergic/Immunologic: negative   PHYSICAL EXAMINATION: General appearance: alert, cooperative and no distress Head: Normocephalic, without obvious abnormality, atraumatic Neck: no adenopathy, no JVD, supple, symmetrical, trachea midline and thyroid not enlarged, symmetric, no tenderness/mass/nodules Lymph nodes: Cervical, supraclavicular, and axillary nodes normal. Resp: clear to auscultation bilaterally Back: symmetric, no curvature. ROM normal. No CVA tenderness. Cardio: regular rate and rhythm, S1, S2 normal, no murmur, click, rub or gallop GI: soft, non-tender; bowel sounds normal; no masses,  no organomegaly Extremities: extremities normal, atraumatic, no cyanosis or edema Neurologic: Alert and oriented X 3, normal strength and tone. Normal symmetric reflexes. Normal coordination and gait  ECOG PERFORMANCE STATUS: 0 - Asymptomatic  Blood pressure 115/74, pulse 71, temperature 98.1 F (36.7 C), temperature source  Tympanic, resp. rate 18, height 5\' 11"  (1.803 m), weight 147 lb 4.8 oz (66.8 kg), SpO2 99 %.  LABORATORY DATA: Lab Results  Component Value Date   WBC 6.1 07/13/2020   HGB 15.8 07/13/2020   HCT 46.4 07/13/2020   MCV 97.1 07/13/2020   PLT 214 07/13/2020      Chemistry      Component Value Date/Time   NA 138 07/13/2020 1031   NA 141 10/29/2017 1049   K 4.2 07/13/2020 1031   K 3.3 (L) 10/29/2017 1049   CL 102 07/13/2020 1031   CO2 28 07/13/2020 1031   CO2 26 10/29/2017 1049   BUN 8 07/13/2020 1031   BUN 11.2 10/29/2017 1049   CREATININE 0.62 07/13/2020 1031   CREATININE 0.7 10/29/2017 1049   GLU 198 08/25/2017 0000      Component Value Date/Time   CALCIUM 9.4 07/13/2020 1031   CALCIUM 9.0 10/29/2017 1049   ALKPHOS 76 07/13/2020 1031   ALKPHOS 82 10/29/2017 1049   AST 13 (L) 07/13/2020 1031   AST 10 10/29/2017 1049   ALT 6 07/13/2020 1031   ALT 8 10/29/2017 1049   BILITOT 0.5 07/13/2020 1031   BILITOT 0.22 10/29/2017 1049       RADIOGRAPHIC STUDIES: CT Soft Tissue Neck W Contrast  Result Date: 07/14/2020 CLINICAL DATA:  Non-small cell lung carcinoma. EXAM: CT NECK WITH CONTRAST TECHNIQUE: Multidetector CT imaging of the neck was performed using  the standard protocol following the bolus administration of intravenous contrast. CONTRAST:  132mL OMNIPAQUE IOHEXOL 300 MG/ML  SOLN COMPARISON:  CT neck 12/02/2019 FINDINGS: Pharynx and larynx: Normal. No mass or swelling. Salivary glands: No inflammation, mass, or stone. Thyroid: Normal. Lymph nodes: None enlarged or abnormal density. Vascular: There is atherosclerotic calcification of the aortic arch. There is moderate stenosis of the proximal left internal carotid artery. Limited intracranial: Negative. Visualized orbits: Negative. Mastoids and visualized paranasal sinuses: Postsurgical changes of the left mastoid. Skeleton: No acute or aggressive process. Upper chest: Small left hydropneumothorax. Post radiation changes at the  left upper lobe. Multiple peripheral nodules in the left lung apex have decreased in size. Please refer to dedicated CT chest for or further details. Other: None. IMPRESSION: 1. No cervical lymphadenopathy. 2. Small left hydropneumothorax with post radiation changes at the left upper lobe. 3. 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 Aortic Atherosclerosis (ICD10-I70.0). Electronically Signed   By: Ulyses Jarred M.D.   On: 07/14/2020 03:57   CT Chest W Contrast  Result Date: 07/13/2020 CLINICAL DATA:  Non-small cell lung cancer, epiglottic cancer restaging, status post radiation therapy, chemotherapy, immunotherapy EXAM: CT CHEST AND ABDOMEN WITH CONTRAST TECHNIQUE: Multidetector CT imaging of the chest and abdomen was performed following the standard protocol during bolus administration of intravenous contrast. CONTRAST:  159mL OMNIPAQUE IOHEXOL 300 MG/ML SOLN, additional oral enteric contrast COMPARISON:  CT abdomen pelvis, 05/12/2020, CT chest, 03/02/2020, PET-CT, 06/10/2017 FINDINGS: CT CHEST FINDINGS Cardiovascular: Right chest port catheter. Aortic atherosclerosis. Normal heart size. No pericardial effusion. Mediastinum/Nodes: No discretely enlarged mediastinal, hilar, or axillary lymph nodes. Unchanged post treatment appearance of soft tissue about the left hilum (series 1, image 29). Thyroid gland, trachea, and esophagus demonstrate no significant findings. Lungs/Pleura: Mild centrilobular and paraseptal emphysema. Diffuse bilateral bronchial wall thickening. Small left hydropneumothorax. Pleural effusion volume is not significantly changed compared to prior examination, however a small new pneumothorax component is new, approximately 5% volume (series 4, image 74, 61). Interval compaction of dense left perihilar fibrotic post treatment consolidation and fibrosis (series 4, image 63). Adjacent pulmonary nodules in the left pulmonary apex are not significantly changed, largest  measuring 0.9 x 0.5 cm and 0.9 x 0.5 cm (series 4, image 38, 37). Musculoskeletal:  (series 4, image 86). CT ABDOMEN FINDINGS Hepatobiliary: No focal liver abnormality is seen. No gallstones, gallbladder wall thickening, or biliary dilatation. Pancreas: Unremarkable. No pancreatic ductal dilatation or surrounding inflammatory changes. Spleen: Normal in size without focal abnormality. Adrenals/Urinary Tract: Stable nodule of lateral limb of the left adrenal gland measuring approximately 1.3 x 1.0 cm, stable in comparison to multiple prior examinations and previously not FDG avid, generally consistent with a benign adenoma (series 1, image 67). Kidneys are normal, without renal calculi, focal lesion, or hydronephrosis. Bladder is unremarkable. Stomach/Bowel: Stomach is within normal limits. No evidence of bowel wall thickening, distention, or inflammatory changes. Vascular/Lymphatic: Aortic atherosclerosis. No enlarged abdominal or pelvic lymph nodes. Other: No abdominal wall hernia or abnormality. Previously noted subcutaneous nodule overlying the lateral right abdomen is no longer present, with minimal residual stranding in this vicinity, possibly related to interval excision (series 1, image 83). No abdominopelvic ascites. Musculoskeletal: No acute or significant osseous findings. IMPRESSION: 1. Interval compaction of dense left perihilar fibrotic post treatment consolidation and fibrosis, generally in with ongoing evolution of radiation change. Unchanged appearance of soft tissue about the left hilum. 2. Adjacent pulmonary nodules in the left pulmonary apex are not significantly changed. No  new nodules. 3. There is a new lytic lesion of the anterior left fifth rib, concerning for osseous metastatic disease or alternately direct involvement from adjacent pleural disease. No other osseous lesions identified in the chest or abdomen. PET-CT may be helpful to assess for additional sites of FDG avid disease. 4. Small  left hydropneumothorax, overall volume of effusion not significantly changed however pneumothorax component is new and presumably related to overlying osseous and/or pleural metastatic lesion in the absence of invasive procedures such as thoracentesis. 5. No evidence of metastatic disease in the abdomen. 6. Emphysema and diffuse bilateral bronchial wall thickening. Emphysema (ICD10-J43.9). 7. Previously noted subcutaneous nodule overlying the lateral right abdomen is no longer present, with minimal residual stranding in this vicinity, possibly related to interval excision. 8. Aortic Atherosclerosis (ICD10-I70.0). Electronically Signed   By: Eddie Candle M.D.   On: 07/13/2020 15:35   CT Abdomen W Contrast  Result Date: 07/13/2020 CLINICAL DATA:  Non-small cell lung cancer, epiglottic cancer restaging, status post radiation therapy, chemotherapy, immunotherapy EXAM: CT CHEST AND ABDOMEN WITH CONTRAST TECHNIQUE: Multidetector CT imaging of the chest and abdomen was performed following the standard protocol during bolus administration of intravenous contrast. CONTRAST:  129mL OMNIPAQUE IOHEXOL 300 MG/ML SOLN, additional oral enteric contrast COMPARISON:  CT abdomen pelvis, 05/12/2020, CT chest, 03/02/2020, PET-CT, 06/10/2017 FINDINGS: CT CHEST FINDINGS Cardiovascular: Right chest port catheter. Aortic atherosclerosis. Normal heart size. No pericardial effusion. Mediastinum/Nodes: No discretely enlarged mediastinal, hilar, or axillary lymph nodes. Unchanged post treatment appearance of soft tissue about the left hilum (series 1, image 29). Thyroid gland, trachea, and esophagus demonstrate no significant findings. Lungs/Pleura: Mild centrilobular and paraseptal emphysema. Diffuse bilateral bronchial wall thickening. Small left hydropneumothorax. Pleural effusion volume is not significantly changed compared to prior examination, however a small new pneumothorax component is new, approximately 5% volume (series 4, image  74, 61). Interval compaction of dense left perihilar fibrotic post treatment consolidation and fibrosis (series 4, image 63). Adjacent pulmonary nodules in the left pulmonary apex are not significantly changed, largest measuring 0.9 x 0.5 cm and 0.9 x 0.5 cm (series 4, image 38, 37). Musculoskeletal:  (series 4, image 86). CT ABDOMEN FINDINGS Hepatobiliary: No focal liver abnormality is seen. No gallstones, gallbladder wall thickening, or biliary dilatation. Pancreas: Unremarkable. No pancreatic ductal dilatation or surrounding inflammatory changes. Spleen: Normal in size without focal abnormality. Adrenals/Urinary Tract: Stable nodule of lateral limb of the left adrenal gland measuring approximately 1.3 x 1.0 cm, stable in comparison to multiple prior examinations and previously not FDG avid, generally consistent with a benign adenoma (series 1, image 67). Kidneys are normal, without renal calculi, focal lesion, or hydronephrosis. Bladder is unremarkable. Stomach/Bowel: Stomach is within normal limits. No evidence of bowel wall thickening, distention, or inflammatory changes. Vascular/Lymphatic: Aortic atherosclerosis. No enlarged abdominal or pelvic lymph nodes. Other: No abdominal wall hernia or abnormality. Previously noted subcutaneous nodule overlying the lateral right abdomen is no longer present, with minimal residual stranding in this vicinity, possibly related to interval excision (series 1, image 83). No abdominopelvic ascites. Musculoskeletal: No acute or significant osseous findings. IMPRESSION: 1. Interval compaction of dense left perihilar fibrotic post treatment consolidation and fibrosis, generally in with ongoing evolution of radiation change. Unchanged appearance of soft tissue about the left hilum. 2. Adjacent pulmonary nodules in the left pulmonary apex are not significantly changed. No new nodules. 3. There is a new lytic lesion of the anterior left fifth rib, concerning for osseous metastatic  disease or alternately direct involvement  from adjacent pleural disease. No other osseous lesions identified in the chest or abdomen. PET-CT may be helpful to assess for additional sites of FDG avid disease. 4. Small left hydropneumothorax, overall volume of effusion not significantly changed however pneumothorax component is new and presumably related to overlying osseous and/or pleural metastatic lesion in the absence of invasive procedures such as thoracentesis. 5. No evidence of metastatic disease in the abdomen. 6. Emphysema and diffuse bilateral bronchial wall thickening. Emphysema (ICD10-J43.9). 7. Previously noted subcutaneous nodule overlying the lateral right abdomen is no longer present, with minimal residual stranding in this vicinity, possibly related to interval excision. 8. Aortic Atherosclerosis (ICD10-I70.0). Electronically Signed   By: Eddie Candle M.D.   On: 07/13/2020 15:35    ASSESSMENT AND PLAN: This is a very pleasant 54 years old white male with recently diagnosed stage IIIB/IV non-small cell lung cancer, adenocarcinoma presented with large left hilar mass in addition to mediastinal and left supraclavicular lymphadenopathy as well as suspicious right upper lobe pulmonary nodule diagnosed in August 2018. 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 He underwent a course of concurrent chemoradiation to the lung as well as the epiglottic area under the care of Dr. Tammi Klippel. He is status post 6 cycle. He tolerated this course of treatment well except for the radiation induced esophagitis as well as weight loss and fatigue. The patient had partial response to the previous treatment. He completed treatment with Keytruda 200 mg IV every 3 weeks status post 35 cycles. The patient tolerated his previous immunotherapy fairly well.  He is currently on observation and feeling fine. He had repeat CT scan of the neck, chest, abdomen pelvis performed  recently.  I personally and independently reviewed the scan images and discussed the results with the patient and his wife today. His scan showed no concerning findings for disease recurrence or metastasis except for sclerotic lesion in the anterior left fifth rib suspicious for osseous metastasis versus direct involvement of the adjacent pleural disease.  He also has small left hydropneumothorax but the patient is asymptomatic. I recommended for the patient to continue on observation with repeat CT scan of the chest in 4 months. The patient was advised to call immediately if he has any concerning symptoms in the interval especially any chest pain or shortness of breath. We will arrange for the patient to have Port-A-Cath flush every 2 months.  The patient voices understanding of current disease status and treatment options and is in agreement with the current care plan. All questions were answered. The patient knows to call the clinic with any problems, questions or concerns. We can certainly see the patient much sooner if necessary. Disclaimer: This note was dictated with voice recognition software. Similar sounding words can inadvertently be transcribed and may not be corrected upon review.

## 2020-07-20 ENCOUNTER — Telehealth: Payer: Self-pay | Admitting: Internal Medicine

## 2020-07-20 NOTE — Telephone Encounter (Signed)
Scheduled per los. Called and left msg. Mailed printout  °

## 2020-07-27 ENCOUNTER — Other Ambulatory Visit: Payer: 59

## 2020-07-30 ENCOUNTER — Telehealth: Payer: Self-pay | Admitting: Medical Oncology

## 2020-07-30 ENCOUNTER — Other Ambulatory Visit: Payer: Self-pay

## 2020-07-30 ENCOUNTER — Ambulatory Visit (HOSPITAL_COMMUNITY)
Admission: RE | Admit: 2020-07-30 | Discharge: 2020-07-30 | Disposition: A | Discharge status: Home / Self Care | Payer: 59 | Source: Ambulatory Visit | Attending: Internal Medicine | Admitting: Internal Medicine

## 2020-07-30 ENCOUNTER — Other Ambulatory Visit: Payer: Self-pay | Admitting: Medical Oncology

## 2020-07-30 DIAGNOSIS — C3490 Malignant neoplasm of unspecified part of unspecified bronchus or lung: Secondary | ICD-10-CM | POA: Diagnosis not present

## 2020-07-30 NOTE — Telephone Encounter (Addendum)
New pain upper back radiates across to left side. Described as sharp.He has not taken anything for the pain. [name] ordered a cxr today.   CT 9/3-" There is a new lytic lesion of the anterior left fifth rib, concerning for osseous metastatic disease or alternately direct involvement from adjacent pleural disease"

## 2020-07-30 NOTE — Telephone Encounter (Signed)
Chest x-ray.  Thank you.

## 2020-07-31 ENCOUNTER — Telehealth: Payer: Self-pay | Admitting: Medical Oncology

## 2020-07-31 ENCOUNTER — Encounter: Payer: Self-pay | Admitting: Medical Oncology

## 2020-07-31 NOTE — Telephone Encounter (Signed)
Returned call

## 2020-07-31 NOTE — Telephone Encounter (Signed)
Pt instructed to call back for uncontrolled pain with ibuprofen.

## 2020-07-31 NOTE — Telephone Encounter (Signed)
Pt notified per Dr Julien Nordmann , to take ibuprofen prn and if pain worsens near left 5th rib to notify office  and Dr Julien Nordmann may refer pt to xrt.

## 2020-08-03 ENCOUNTER — Other Ambulatory Visit: Payer: 59

## 2020-08-03 DIAGNOSIS — Z20822 Contact with and (suspected) exposure to covid-19: Secondary | ICD-10-CM

## 2020-08-04 LAB — SARS-COV-2, NAA 2 DAY TAT

## 2020-08-04 LAB — SPECIMEN STATUS REPORT

## 2020-08-04 LAB — NOVEL CORONAVIRUS, NAA: SARS-CoV-2, NAA: NOT DETECTED

## 2020-08-06 ENCOUNTER — Other Ambulatory Visit: Payer: Self-pay | Admitting: Physician Assistant

## 2020-08-06 ENCOUNTER — Ambulatory Visit (HOSPITAL_COMMUNITY)
Admission: RE | Admit: 2020-08-06 | Discharge: 2020-08-06 | Disposition: A | Discharge status: Home / Self Care | Payer: 59 | Source: Ambulatory Visit | Attending: Pulmonary Disease | Admitting: Pulmonary Disease

## 2020-08-06 DIAGNOSIS — U071 COVID-19: Secondary | ICD-10-CM | POA: Diagnosis not present

## 2020-08-06 DIAGNOSIS — I10 Essential (primary) hypertension: Secondary | ICD-10-CM

## 2020-08-06 DIAGNOSIS — C321 Malignant neoplasm of supraglottis: Secondary | ICD-10-CM | POA: Diagnosis present

## 2020-08-06 DIAGNOSIS — J449 Chronic obstructive pulmonary disease, unspecified: Secondary | ICD-10-CM | POA: Diagnosis present

## 2020-08-06 MED ORDER — FAMOTIDINE IN NACL 20-0.9 MG/50ML-% IV SOLN: 20.0000 mg | Freq: Once | INTRAVENOUS | Status: DC | PRN

## 2020-08-06 MED ORDER — METHYLPREDNISOLONE SODIUM SUCC 125 MG IJ SOLR: 125.0000 mg | Freq: Once | INTRAMUSCULAR | Status: DC | PRN

## 2020-08-06 MED ORDER — SODIUM CHLORIDE 0.9 % IV SOLN
1200.0000 mg | Freq: Once | INTRAVENOUS | Status: AC
  Administered 2020-08-06: 1200 mg via INTRAVENOUS

## 2020-08-06 MED ORDER — EPINEPHRINE 0.3 MG/0.3ML IJ SOAJ: 0.3000 mg | Freq: Once | INTRAMUSCULAR | Status: DC | PRN

## 2020-08-06 MED ORDER — SODIUM CHLORIDE 0.9 % IV SOLN: INTRAVENOUS | Status: DC | PRN

## 2020-08-06 MED ORDER — ALBUTEROL SULFATE HFA 108 (90 BASE) MCG/ACT IN AERS: 2.0000 | INHALATION_SPRAY | Freq: Once | RESPIRATORY_TRACT | Status: DC | PRN

## 2020-08-06 MED ORDER — DIPHENHYDRAMINE HCL 50 MG/ML IJ SOLN: 50.0000 mg | Freq: Once | INTRAMUSCULAR | Status: DC | PRN

## 2020-08-06 NOTE — Discharge Instructions (Signed)
What types of side effects do monoclonal antibody drugs cause?  °Common side effects ° °In general, the more common side effects caused by monoclonal antibody drugs include: °• Allergic reactions, such as hives or itching °• Flu-like signs and symptoms, including chills, fatigue, fever, and muscle aches and pains °• Nausea, vomiting °• Diarrhea °• Skin rashes °• Low blood pressure ° ° °The CDC is recommending patients who receive monoclonal antibody treatments wait at least 90 days before being vaccinated. ° °Currently, there are no data on the safety and efficacy of mRNA COVID-19 vaccines in persons who received monoclonal antibodies or convalescent plasma as part of COVID-19 treatment. Based on the estimated half-life of such therapies as well as evidence suggesting that reinfection is uncommon in the 90 days after initial infection, vaccination should be deferred for at least 90 days, as a precautionary measure until additional information becomes available, to avoid interference of the antibody treatment with vaccine-induced immune responses. ° ° °10 Things You Can Do to Manage Your COVID-19 Symptoms at Home °If you have possible or confirmed COVID-19: °1. Stay home from work and school. And stay away from other public places. If you must go out, avoid using any kind of public transportation, ridesharing, or taxis. °2. Monitor your symptoms carefully. If your symptoms get worse, call your healthcare provider immediately. °3. Get rest and stay hydrated. °4. If you have a medical appointment, call the healthcare provider ahead of time and tell them that you have or may have COVID-19. °5. For medical emergencies, call 911 and notify the dispatch personnel that you have or may have COVID-19. °6. Cover your cough and sneezes with a tissue or use the inside of your elbow. °7. Wash your hands often with soap and water for at least 20 seconds or clean your hands with an alcohol-based hand sanitizer that contains at  least 60% alcohol. °8. As much as possible, stay in a specific room and away from other people in your home. Also, you should use a separate bathroom, if available. If you need to be around other people in or outside of the home, wear a mask. °9. Avoid sharing personal items with other people in your household, like dishes, towels, and bedding. °10. Clean all surfaces that are touched often, like counters, tabletops, and doorknobs. Use household cleaning sprays or wipes according to the label instructions. °cdc.gov/coronavirus °05/11/2019 °This information is not intended to replace advice given to you by your health care provider. Make sure you discuss any questions you have with your health care provider. °Document Revised: 10/13/2019 Document Reviewed: 10/13/2019 °Elsevier Patient Education © 2020 [employer] °What types of side effects do monoclonal antibody drugs cause?  °Common side effects ° °In general, the more common side effects caused by monoclonal antibody drugs include: °• Allergic reactions, such as hives or itching °• Flu-like signs and symptoms, including chills, fatigue, fever, and muscle aches and pains °• Nausea, vomiting °• Diarrhea °• Skin rashes °• Low blood pressure ° ° °The CDC is recommending patients who receive monoclonal antibody treatments wait at least 90 days before being vaccinated. ° °Currently, there are no data on the safety and efficacy of mRNA COVID-19 vaccines in persons who received monoclonal antibodies or convalescent plasma as part of COVID-19 treatment. Based on the estimated half-life of such therapies as well as evidence suggesting that reinfection is uncommon in the 90 days after initial infection, vaccination should be deferred for at least 90 days, as a precautionary measure until   additional information becomes available, to avoid interference of the antibody treatment with vaccine-induced immune responses. °

## 2020-08-06 NOTE — Progress Notes (Signed)
  Diagnosis: COVID-19  Physician: Dr. Joya Gaskins  Procedure: [facility] Med: casirivimab\imdevimab infusion - Provided patient with casirivimab\imdevimab fact sheet for patients, parents and caregivers prior to infusion.  Complications: No immediate complications noted.  Discharge: Discharged home   Tia Masker 08/06/2020

## 2020-08-06 NOTE — Progress Notes (Signed)
I connected by phone with Trevor Zavala on 08/06/2020 at 12:28 PM to discuss the potential use of a new treatment for mild to moderate COVID-19 viral infection in non-hospitalized patients.  This patient is a 54 y.o. male that meets the FDA criteria for Emergency Use Authorization of COVID monoclonal antibody casirivimab/imdevimab.  Has a (+) direct SARS-CoV-2 viral test result  Has mild or moderate COVID-19   Is NOT hospitalized due to COVID-19  Is within 10 days of symptom onset  Has at least one of the high risk factor(s) for progression to severe COVID-19 and/or hospitalization as defined in EUA.  Specific high risk criteria : BMI > 25, Immunosuppressive Disease or Treatment, Cardiovascular disease or hypertension and Chronic Lung Disease   I have spoken and communicated the following to the patient or parent/caregiver regarding COVID monoclonal antibody treatment:  1. FDA has authorized the emergency use for the treatment of mild to moderate COVID-19 in adults and pediatric patients with positive results of direct SARS-CoV-2 viral testing who are 62 years of age and older weighing at least 40 kg, and who are at high risk for progressing to severe COVID-19 and/or hospitalization.  2. The significant known and potential risks and benefits of COVID monoclonal antibody, and the extent to which such potential risks and benefits are unknown.  3. Information on available alternative treatments and the risks and benefits of those alternatives, including clinical trials.  4. Patients treated with COVID monoclonal antibody should continue to self-isolate and use infection control measures (e.g., wear mask, isolate, social distance, avoid sharing personal items, clean and disinfect "high touch" surfaces, and frequent handwashing) according to CDC guidelines.   5. The patient or parent/caregiver has the option to accept or refuse COVID monoclonal antibody treatment.  After reviewing this  information with the patient, the patient has agreed to receive one of the available covid 19 monoclonal antibodies and will be provided an appropriate fact sheet prior to infusion.  Sx onset 9/27. Set up for infusion on 9/27 @ 3:30pm. Directions given to [facility]. Pt is aware that insurance will be charged an infusion fee. Pt is fully vaccinated.  [name] 08/06/2020 12:28 PM

## 2020-08-08 ENCOUNTER — Telehealth: Payer: Self-pay | Admitting: Medical Oncology

## 2020-08-08 NOTE — Telephone Encounter (Signed)
Pt reports his pain left rib is no better and he would like to see XRT if recommended by [facility].   Will request xrt referral from [facility].

## 2020-08-09 ENCOUNTER — Other Ambulatory Visit: Payer: Self-pay | Admitting: Internal Medicine

## 2020-08-09 DIAGNOSIS — C3412 Malignant neoplasm of upper lobe, left bronchus or lung: Secondary | ICD-10-CM

## 2020-08-09 NOTE — Telephone Encounter (Signed)
Done. Thank you.

## 2020-08-13 ENCOUNTER — Other Ambulatory Visit: Payer: Self-pay | Admitting: Internal Medicine

## 2020-08-24 ENCOUNTER — Other Ambulatory Visit: Payer: Self-pay | Admitting: Urology

## 2020-08-24 ENCOUNTER — Telehealth: Payer: Self-pay | Admitting: Radiation Oncology

## 2020-08-24 ENCOUNTER — Ambulatory Visit
Admission: RE | Admit: 2020-08-24 | Discharge: 2020-08-24 | Disposition: A | Discharge status: Home / Self Care | Payer: 59 | Source: Ambulatory Visit | Attending: Radiation Oncology | Admitting: Radiation Oncology

## 2020-08-24 ENCOUNTER — Ambulatory Visit
Admission: RE | Admit: 2020-08-24 | Discharge: 2020-08-24 | Disposition: A | Discharge status: Home / Self Care | Payer: 59 | Source: Ambulatory Visit | Attending: Urology | Admitting: Urology

## 2020-08-24 ENCOUNTER — Other Ambulatory Visit: Payer: Self-pay

## 2020-08-24 ENCOUNTER — Encounter: Payer: Self-pay | Admitting: Urology

## 2020-08-24 DIAGNOSIS — C3492 Malignant neoplasm of unspecified part of left bronchus or lung: Secondary | ICD-10-CM | POA: Insufficient documentation

## 2020-08-24 DIAGNOSIS — M5440 Lumbago with sciatica, unspecified side: Secondary | ICD-10-CM

## 2020-08-24 DIAGNOSIS — J449 Chronic obstructive pulmonary disease, unspecified: Secondary | ICD-10-CM | POA: Insufficient documentation

## 2020-08-24 DIAGNOSIS — J90 Pleural effusion, not elsewhere classified: Secondary | ICD-10-CM | POA: Insufficient documentation

## 2020-08-24 DIAGNOSIS — C7951 Secondary malignant neoplasm of bone: Secondary | ICD-10-CM | POA: Diagnosis present

## 2020-08-24 DIAGNOSIS — Z87891 Personal history of nicotine dependence: Secondary | ICD-10-CM | POA: Insufficient documentation

## 2020-08-24 DIAGNOSIS — C321 Malignant neoplasm of supraglottis: Secondary | ICD-10-CM | POA: Insufficient documentation

## 2020-08-24 DIAGNOSIS — Z803 Family history of malignant neoplasm of breast: Secondary | ICD-10-CM | POA: Insufficient documentation

## 2020-08-24 DIAGNOSIS — K219 Gastro-esophageal reflux disease without esophagitis: Secondary | ICD-10-CM | POA: Insufficient documentation

## 2020-08-24 DIAGNOSIS — C3412 Malignant neoplasm of upper lobe, left bronchus or lung: Secondary | ICD-10-CM | POA: Insufficient documentation

## 2020-08-24 DIAGNOSIS — Z510 Encounter for antineoplastic radiation therapy: Secondary | ICD-10-CM | POA: Insufficient documentation

## 2020-08-24 DIAGNOSIS — F419 Anxiety disorder, unspecified: Secondary | ICD-10-CM | POA: Insufficient documentation

## 2020-08-24 DIAGNOSIS — I10 Essential (primary) hypertension: Secondary | ICD-10-CM | POA: Insufficient documentation

## 2020-08-24 DIAGNOSIS — J439 Emphysema, unspecified: Secondary | ICD-10-CM | POA: Insufficient documentation

## 2020-08-24 DIAGNOSIS — Z800 Family history of malignant neoplasm of digestive organs: Secondary | ICD-10-CM | POA: Insufficient documentation

## 2020-08-24 MED ORDER — OXYCODONE-ACETAMINOPHEN 5-325 MG PO TABS
1.0000 | ORAL_TABLET | Freq: Four times a day (QID) | ORAL | 0 refills | Status: DC | PRN
Start: 2020-08-24 — End: 2020-09-10

## 2020-08-24 MED ORDER — OXYCODONE-ACETAMINOPHEN 5-325 MG PO TABS
1.0000 | ORAL_TABLET | Freq: Four times a day (QID) | ORAL | 0 refills | Status: DC | PRN
Start: 2020-08-24 — End: 2020-08-24

## 2020-08-24 NOTE — Progress Notes (Signed)
Radiation Oncology         [phone] ________________________________  Outpatient Re-Consultation  Name: Trevor Zavala MRN: [id number]  Date: 08/24/2020  DOB: 24-Oct-1966  [id number], [name], MD  Curt Bears, MD   REFERRING PHYSICIAN: Curt Bears, MD  DIAGNOSIS: 54 y.o. 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  1. Stage IIIB/IV (T3, N3, M1a) non-small celllung cancer, squamous cell carcinoma of the left upper lung 2. Clinical stage IA non-small cell carcinoma of the right upper lung  3. 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  HISTORY OF PRESENT ILLNESS: Trevor Zavala is a 54 y.o. male seen at the request of Dr. Julien Nordmann for a new diagnosis of lung cancer. He presented to his primary care physician with hemoptysis and unintentional weight loss, who ordered a CT scan of the chest, which was performed on 06/05/2017 and it showed left hilar mass highly worrisome for bronchogenic carcinoma. Patient was diagnosed with Stage IIB/IV (T3, N3, M1a) non-small celllung cancer, squamous cell carcinomapresented with large left hilar mass in addition to mediastinal and left supraclavicular lymphadenopathy as well as contralateral right upper lobe nodulediagnosed in August 2018. CT of the chest on 06/06/2017 revealed no evidence of pulmonary embolus. PET on 06/10/2017 revealed hypermetabolic large left hilar mass with hypermetabolic mediastinal and left supraclavicular lymph nodes, contralateral 12 mm hypermetabolic right upper lobe nodule is not morphologically typical for a metastasis and may reflect a synchronous primary cancer,and there is no evidence of metastatic disease within the abdomen or pelvis. On 06/11/2017, MRI of the brain showed no evidence of metastatic disease to the brain. Biopsy on 06/12/2017 under the care of Dr. Servando Snare revealed squamous cell carcinoma in the right lower lobe and the  left upper lobe of the lung under. Under the care of Dr. Servando Snare patient had a bronchoscopy and endobronchial ultrasound.   We met with the patient on 06/22/2017 to discuss adjuvant radiation therapy. He received concurrent chemoradiation, with radiotherapy directed to the lung and larynx and chemotherapy consisting of  6 cycles of carboplatin and paclitaxel. He was later started on Keytruda, on 09/17/2017, of which he has completed 35 cycles. Since completion of the Keytruda on 09/01/2019, he has remained under observation with Dr. Julien Nordmann.  More recently, he underwent restaging CT C/A/P and neck on 07/13/2020, which showed an overall excellent, stable response to treatment, with the exception of a new lytic lesion in the anterior left 5th rib. At the time of imaging, the lesion was not painful, so the patient and Dr. Julien Nordmann opted for continued surveillance. However, the patient contacted Dr. Worthy Flank office on 07/30/2020 with complaints of progressive left-sided chest/rib pain which is no longer well controlled with ibuprofen/Alleve.   The patient has been kindly referred to [place] today to discuss potential radiation treatment options to the isolated, painful left 5th rib metastasis.   PREVIOUS RADIATION THERAPY: Yes Curative concurrent chemoradiation 06/30/2017 to 08/18/2017: 1. The lung and larynx were treated to 44 Gy in 22 fractions of 2 Gy. 2. The targets were boosted to 22 Gy in 11 fractions of 2 Gy.  PAST MEDICAL HISTORY:  Past Medical History:  Diagnosis Date   Anxiety    COPD (chronic obstructive pulmonary disease) (HCC)    GERD (gastroesophageal reflux disease)    Headache    Hypertension    Pneumonia    Situational depression    Stage III squamous cell carcinoma of left lung (  [place]) 06/19/2017   Lungs & Epiglottis   Tubular adenoma of colon 01/2015   Tubular adenoma of colon 01/2015      PAST SURGICAL HISTORY: Past Surgical History:  Procedure Laterality Date    COLONOSCOPY     DIRECT LARYNGOSCOPY N/A 06/25/2017   Procedure: DIRECT LARYNGOSCOPY AND BIOPSY;  Surgeon: Rozetta Nunnery, MD;  Location: [place];  Service: ENT;  Laterality: N/A;   EAR CYST EXCISION N/A 11/17/2013   Procedure: SEBACEOUS CYST CHEST;  Surgeon: Joyice Faster. Cornett, MD;  Location: [place];  Service: General;  Laterality: N/A;   IR GASTROSTOMY TUBE MOD SED  08/10/2017   IR GASTROSTOMY TUBE REMOVAL  09/29/2017   KNEE ARTHROSCOPY     LEFT   LIPOMA EXCISION N/A 11/17/2013   Procedure: EXCISION LIPOMA FOREHEAD;  Surgeon: Joyice Faster. Cornett, MD;  Location: [place];  Service: General;  Laterality: N/A;   LUNG BIOPSY Bilateral 06/12/2017   Procedure: LEFT LUNG BIOPSY;  Surgeon: Grace Isaac, MD;  Location: [facility] OR;  Service: Thoracic;  Laterality: Bilateral;   MICROLARYNGOSCOPY W/VOCAL CORD INJECTION Left 07/16/2018   Procedure: suspened micro laryngoscopy with jet ventilation and prolaryn injection;  Surgeon: Melida Quitter, MD;  Location: [place];  Service: ENT;  Laterality: Left;   PORTACATH PLACEMENT Right 07/01/2017   Procedure: INSERTION PORT-A-CATH - RIGHT IJ - placed with Fluoro and Ultrasound;  Surgeon: Grace Isaac, MD;  Location: [place];  Service: Thoracic;  Laterality: Right;   VIDEO BRONCHOSCOPY WITH ENDOBRONCHIAL ULTRASOUND N/A 06/12/2017   Procedure: VIDEO BRONCHOSCOPY WITH ENDOBRONCHIAL ULTRASOUND;  Surgeon: Grace Isaac, MD;  Location: [place];  Service: Thoracic;  Laterality: N/A;    FAMILY HISTORY:  Family History  Problem Relation Age of Onset   Breast cancer Mother    Hypertension Mother    Colon cancer Mother    Skin cancer Father    Hypertension Father    Colon polyps Father    Cancer Father 18       b cell lymphoma   Colon cancer Father    Rectal cancer Neg Hx     SOCIAL HISTORY:  Social History   Socioeconomic History   Marital status: Married    Spouse name: Not on file    Number of children: Not on file   Years of education: Not on file   Highest education level: Not on file  Occupational History   Not on file  Tobacco Use   Smoking status: Former Smoker    Packs/day: 0.25    Years: 35.00    Pack years: 8.75    Quit date: 08/10/2017    Years since quitting: 3.0   Smokeless tobacco: Never Used   Tobacco comment: down to 4 cig/day01/08/2018   Vaping Use   Vaping Use: Former  Substance and Sexual Activity   Alcohol use: Yes    Alcohol/week: 0.0 standard drinks    Comment: SOCIAL   Drug use: No   Sexual activity: Yes  Other Topics Concern   Not on file  Social History Narrative   Daily Caffeine Use:  3   Social Determinants of Health   Financial Resource Strain:    Difficulty of Paying Living Expenses: Not on file  Food Insecurity:    Worried About [place] in the Last Year: Not on file   Ran Out of Food in the Last Year: Not on file  Transportation Needs:    Lack of  Transportation (Medical): Not on file   Lack of Transportation (Non-Medical): Not on file  Physical Activity:    Days of Exercise per Week: Not on file   Minutes of Exercise per Session: Not on file  Stress:    Feeling of Stress : Not on file  Social Connections:    Frequency of Communication with Friends and Family: Not on file   Frequency of Social Gatherings with Friends and Family: Not on file   Attends Religious Services: Not on file   Active Member of Clubs or Organizations: Not on file   Attends Archivist Meetings: Not on file   Marital Status: Not on file  Intimate Partner Violence:    Fear of Current or Ex-Partner: Not on file   Emotionally Abused: Not on file   Physically Abused: Not on file   Sexually Abused: Not on file    ALLERGIES: Escitalopram oxalate  MEDICATIONS:  Current Outpatient Medications  Medication Sig Dispense Refill   BREO ELLIPTA 100-25 MCG/INH AEPB Inhale 1 puff into the lungs  daily.     clonazePAM (KLONOPIN) 0.5 MG tablet TAKE 1 OR 2 TABLETS BY MOUTH TWICE A DAY AS NEEDED FOR ANXIETY 180 tablet 0   esomeprazole (NEXIUM) 40 MG capsule TAKE 1 CAPSULE BY MOUTH EVERY DAY IN THE MORNING 90 capsule 3   fexofenadine-pseudoephedrine (ALLEGRA-D 24) 180-240 MG 24 hr tablet Take 1 tablet by mouth daily.     folic acid (FOLVITE) 1 MG tablet Take 1 mg by mouth daily.     acyclovir (ZOVIRAX) 400 MG tablet Take 1 tablet (400 mg total) by mouth 3 (three) times daily. (Patient not taking: Reported on 08/24/2020) 21 tablet 3   No current facility-administered medications for this encounter.    REVIEW OF SYSTEMS:  On review of systems, the patient reports that he is doing well overall. He denies fevers, chills, night sweats, and unintended weight changes. He denies any bowel or bladder disturbances, and denies abdominal pain, nausea or vomiting.  He has not had any recent increased productive cough, shortness of breath, chest pain or hemoptysis.  Aside from the isolated left chest wall/rib pain, he denies any new musculoskeletal or joint aches or pains. A complete review of systems is obtained and is otherwise negative.  PHYSICAL EXAM:  Wt Readings from Last 3 Encounters:  08/24/20 144 lb 2 oz (65.4 kg)  07/17/20 147 lb 4.8 oz (66.8 kg)  05/04/20 146 lb (66.2 kg)   Temp Readings from Last 3 Encounters:  08/24/20 (!) 97.3 F (36.3 C) (Temporal)  08/06/20 98.7 F (37.1 C) (Oral)  07/17/20 98.1 F (36.7 C) (Tympanic)   BP Readings from Last 3 Encounters:  08/24/20 (!) 141/76  08/06/20 138/77  07/17/20 115/74   Pulse Readings from Last 3 Encounters:  08/24/20 67  08/06/20 73  07/17/20 71   Pain Assessment Pain Score: 4  Pain Frequency: Constant Pain Loc: Chest (left chest and left upper back. Aleve helps.)/10  In general this is a well appearing Caucasian male in no acute distress. He's alert and oriented x4 and appropriate throughout the examination.  Cardiopulmonary assessment is negative for acute distress and he exhibits normal effort.    KPS = 90  100 - Normal; no complaints; no evidence of disease. 90   - Able to carry on normal activity; minor signs or symptoms of disease. 80   - Normal activity with effort; some signs or symptoms of disease. 95   - Cares for self;  unable to carry on normal activity or to do active work. 60   - Requires occasional assistance, but is able to care for most of his personal needs. 50   - Requires considerable assistance and frequent medical care. 19   - Disabled; requires special care and assistance. 90   - Severely disabled; hospital admission is indicated although death not imminent. 70   - Very sick; hospital admission necessary; active supportive treatment necessary. 10   - Moribund; fatal processes progressing rapidly. 0     - Dead  Karnofsky DA, Abelmann [place], Craver LS and Burchenal JH [phone]) The use of the nitrogen mustards in the palliative treatment of carcinoma: with particular reference to bronchogenic carcinoma Cancer 1 [id number]  LABORATORY DATA:  Lab Results  Component Value Date   WBC 6.1 07/13/2020   HGB 15.8 07/13/2020   HCT 46.4 07/13/2020   MCV 97.1 07/13/2020   PLT 214 07/13/2020   Lab Results  Component Value Date   NA 138 07/13/2020   K 4.2 07/13/2020   CL 102 07/13/2020   CO2 28 07/13/2020   Lab Results  Component Value Date   ALT 6 07/13/2020   AST 13 (L) 07/13/2020   ALKPHOS 76 07/13/2020   BILITOT 0.5 07/13/2020     RADIOGRAPHY: DG Chest 2 View  Result Date: 07/30/2020 CLINICAL DATA:  Upper back pain EXAM: CHEST - 2 VIEW COMPARISON:  CT chest 07/13/2020 FINDINGS: Right IJ Port-A-Cath with the tip projecting at the right atrium. Cardiac silhouette is within normal limits. Left perihilar fibrotic change, similar when comparing across modalities to recent CT chest. No new areas of consolidation. Small left pleural effusion. Query trace left apical pneumothorax. The  heart size and mediastinal contours are within normal limits. No consolidation. The visualized skeletal structures are unremarkable. Lytic rib lesion is better characterized on prior CT. Emphysema IMPRESSION: 1. Left perihilar fibrotic change, similar when comparing across modalities to recent CT chest. 2. Small left pleural effusion. Query trace left apical pneumothorax. 3. Emphysema. Electronically Signed   By: Margaretha Sheffield MD   On: 07/30/2020 16:20      IMPRESSION/PLAN: 1. 54 y.o. gentleman withpainful rib metastasis from stage IV NSCLC, squamous cell carcinoma of the left lung. Today, we talked to the patient and family about the findings and work-up thus far.  We discussed the natural history of lung cancer and the general treatment, highlighting the role of stereotactic radiotherapy (SBRT) in the management of painful, oligometastatic disease.  In this setting, our recommendation is to proceed with a 5 fraction course of definitive SBRT to the isolated left 5th rib lesion.  We discussed the available radiation techniques, and focused on the details and logistics of delivery.  We reviewed the anticipated acute and late sequelae associated with radiation in this setting.  We did discuss the increased risk of rib fracture with heavy lifting, bending, pushing, pulling and twisting following his radiation treatments.  He has a very physical job and is therefore considering completing FMLA paperwork to allow adequate time for healing following his treatments. The patient was encouraged to ask questions that were answered to his stated satisfaction.   At the end of our conversation, the patient is interested in proceeding with the recommended 5 fraction course of SBRT targeting the painful left 5th rib metastasis. He appears to have a good understanding of his disease and our treatment recommendations which are of curative intent.  He has freely signed written consent today in the office  and will proceed  with CT simulation/treatment planning following this appointment today in anticipation of beginning his radiotherapy early next week.  He was provided with a prescription for Percocet 5/325 mg to be used as needed for severe pain.  We enjoyed meeting with him today and look forward to continuing to participate in his care.  He knows that he is welcome to call with any questions or concerns in the interim.  We will share our discussion with Dr. Earlie Server and move forward with treatment planning accordingly.      Nicholos Johns, PA-C    Tyler Pita, MD  [facility] Direct Dial: [phone]   Fax: [phone] [email or web address]   Skype   [email or web address]   This document serves as a record of services personally performed by Tyler Pita, MD and Freeman Caldron, PA-C. It was created on their behalf by Wilburn Mylar, a trained medical scribe. The creation of this record is based on the scribe's personal observations and the provider's statements to them. This document has been checked and approved by the attending provider.

## 2020-08-24 NOTE — Telephone Encounter (Signed)
Received voicemail message from patient concern pain script had not been sent to pharmacy by Freeman Caldron, PA-C. Noted system glitch. Phoned patient to make him aware Ashlyn has been notified and script will be sent again. Patient verbalized understanding and appreciation for call back.

## 2020-08-26 NOTE — Progress Notes (Signed)
  Radiation Oncology         [phone] ________________________________  Name: Trevor Zavala MRN: [id number]  Date: 08/24/2020  DOB: 1966/08/13  STEREOTACTIC BODY RADIOTHERAPY SIMULATION AND TREATMENT PLANNING NOTE    ICD-10-CM   1. 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  Identity was confirmed.  All relevant records and images related to the planned course of therapy were reviewed.  The patient freely provided informed written consent to proceed with treatment after reviewing the details related to the planned course of therapy. The consent form was witnessed and verified by the simulation staff.  Then, the patient was set-up in a stable reproducible  supine position for radiation therapy.  A BodyFix immobilization pillow was fabricated for reproducible positioning.  Then I personally applied the abdominal compression paddle to limit respiratory excursion.  4D respiratoy motion management CT images were obtained.  Surface markings were placed.  The CT images were loaded into the planning software.  Then, using Cine, MIP, and standard views, the internal target volume (ITV) and planning target volumes (PTV) were delinieated, and avoidance structures were contoured.  Treatment planning then occurred.  The radiation prescription was entered and confirmed.  A total of two complex treatment devices were fabricated in the form of the BodyFix immobilization pillow and a neck accuform cushion.  I have requested : 3D Simulation  I have requested a DVH of the following structures: Heart, Lungs, Esophagus, Chest Wall, Brachial Plexus, Major Blood Vessels, and targets.  SPECIAL TREATMENT PROCEDURE:  The planned course of therapy using radiation constitutes a special treatment procedure. Special care is required in  the management of this patient for the following reasons. This treatment constitutes a Special Treatment Procedure for the following reason: [ High dose per fraction requiring special monitoring for increased toxicities of treatment including daily imaging..  The special nature of the planned course of radiotherapy will require increased physician supervision and oversight to ensure patient's safety with optimal treatment outcomes.  RESPIRATORY MOTION MANAGEMENT SIMULATION:  In order to account for effect of respiratory motion on target structures and other organs in the planning and delivery of radiotherapy, this patient underwent respiratory motion management simulation.  To accomplish this, when the patient was brought to the CT simulation planning suite, 4D respiratoy motion management CT images were obtained.  The CT images were loaded into the planning software.  Then, using a variety of tools including Cine, MIP, and standard views, the target volume and planning target volumes (PTV) were delineated.  Avoidance structures were contoured.  Treatment planning then occurred.  Dose volume histograms were generated and reviewed for each of the requested structure.  The resulting plan was carefully reviewed and approved today.  PLAN:  The patient will receive 40 Gy in 5 fraction.  ________________________________  Sheral Apley Tammi Klippel, M.D.

## 2020-08-31 DIAGNOSIS — Z510 Encounter for antineoplastic radiation therapy: Secondary | ICD-10-CM | POA: Diagnosis not present

## 2020-09-04 ENCOUNTER — Ambulatory Visit
Admission: RE | Admit: 2020-09-04 | Discharge: 2020-09-04 | Disposition: A | Discharge status: Home / Self Care | Payer: 59 | Source: Ambulatory Visit | Attending: Radiation Oncology | Admitting: Radiation Oncology

## 2020-09-04 ENCOUNTER — Telehealth: Payer: Self-pay | Admitting: Radiation Oncology

## 2020-09-04 ENCOUNTER — Other Ambulatory Visit: Payer: Self-pay

## 2020-09-04 DIAGNOSIS — Z510 Encounter for antineoplastic radiation therapy: Secondary | ICD-10-CM | POA: Diagnosis not present

## 2020-09-04 NOTE — Telephone Encounter (Signed)
Received voicemail message from patient requesting return call. Phoned patient to inquire. Patient question if our office has received paperwork from [facility] Explained that I do not see any noted in the Epic system. Encouraged patient to provided his employer with my direct number to facilitate faster processing of the paperwork in question. Patient verbalized understanding and expressed appreciation for the call back.

## 2020-09-05 ENCOUNTER — Ambulatory Visit: Payer: 59

## 2020-09-05 ENCOUNTER — Telehealth: Payer: Self-pay

## 2020-09-05 NOTE — Telephone Encounter (Signed)
Pt Lm requesting a CB. He did not indicate the reason for his call.  I called pt and he wanted to know if his FMLA paperwork had been received.  I confirmed with the FMLA/Disability department that his forms were received and in process. Pt was advised of this and expressed understanding of this information.

## 2020-09-06 ENCOUNTER — Ambulatory Visit
Admission: RE | Admit: 2020-09-06 | Discharge: 2020-09-06 | Disposition: A | Discharge status: Home / Self Care | Payer: 59 | Source: Ambulatory Visit | Attending: Radiation Oncology | Admitting: Radiation Oncology

## 2020-09-06 DIAGNOSIS — Z510 Encounter for antineoplastic radiation therapy: Secondary | ICD-10-CM | POA: Diagnosis not present

## 2020-09-07 ENCOUNTER — Ambulatory Visit: Payer: 59

## 2020-09-07 ENCOUNTER — Inpatient Hospital Stay: Payer: 59 | Attending: Internal Medicine

## 2020-09-07 ENCOUNTER — Other Ambulatory Visit: Payer: Self-pay

## 2020-09-07 DIAGNOSIS — C3412 Malignant neoplasm of upper lobe, left bronchus or lung: Secondary | ICD-10-CM | POA: Insufficient documentation

## 2020-09-07 DIAGNOSIS — C321 Malignant neoplasm of supraglottis: Secondary | ICD-10-CM

## 2020-09-07 DIAGNOSIS — Z452 Encounter for adjustment and management of vascular access device: Secondary | ICD-10-CM | POA: Insufficient documentation

## 2020-09-07 DIAGNOSIS — Z95828 Presence of other vascular implants and grafts: Secondary | ICD-10-CM

## 2020-09-07 MED ORDER — HEPARIN SOD (PORK) LOCK FLUSH 100 UNIT/ML IV SOLN
500.0000 [IU] | Freq: Once | INTRAVENOUS | Status: AC
  Administered 2020-09-07: 500 [IU]
  Filled 2020-09-07: qty 5

## 2020-09-07 MED ORDER — SODIUM CHLORIDE 0.9% FLUSH
10.0000 mL | Freq: Once | INTRAVENOUS | Status: AC
  Administered 2020-09-07: 10 mL
  Filled 2020-09-07: qty 10

## 2020-09-07 NOTE — Patient Instructions (Signed)

## 2020-09-10 ENCOUNTER — Telehealth: Payer: Self-pay | Admitting: Radiation Oncology

## 2020-09-10 ENCOUNTER — Other Ambulatory Visit: Payer: Self-pay | Admitting: Internal Medicine

## 2020-09-10 ENCOUNTER — Other Ambulatory Visit: Payer: Self-pay | Admitting: Radiation Oncology

## 2020-09-10 ENCOUNTER — Ambulatory Visit
Admission: RE | Admit: 2020-09-10 | Discharge: 2020-09-10 | Disposition: A | Discharge status: Home / Self Care | Payer: 59 | Source: Ambulatory Visit | Attending: Radiation Oncology | Admitting: Radiation Oncology

## 2020-09-10 DIAGNOSIS — C7951 Secondary malignant neoplasm of bone: Secondary | ICD-10-CM | POA: Insufficient documentation

## 2020-09-10 DIAGNOSIS — Z510 Encounter for antineoplastic radiation therapy: Secondary | ICD-10-CM | POA: Insufficient documentation

## 2020-09-10 DIAGNOSIS — C3492 Malignant neoplasm of unspecified part of left bronchus or lung: Secondary | ICD-10-CM | POA: Diagnosis not present

## 2020-09-10 MED ORDER — OXYCODONE-ACETAMINOPHEN 5-325 MG PO TABS
1.0000 | ORAL_TABLET | Freq: Four times a day (QID) | ORAL | 0 refills | Status: DC | PRN
Start: 2020-09-10 — End: 2020-09-20

## 2020-09-10 NOTE — Telephone Encounter (Signed)
-----   Message from Hayden Pedro, [place] sent at 09/10/2020  3:15 PM EDT ----- Regarding: RE: Took last pill this morning I just sent in another rx for the same that was sent in last time.  ----- Message ----- From: Heywood Footman, RN Sent: 09/10/2020   3:00 PM EDT To: Tyler Pita, MD, Freeman Caldron, PA-C, # Subject: Took last pill this morning                    Received call from patient requesting a refill of his percocet. Patient explained he took his last tablet this morning. Patient received his third fraction of five intended today. Ashlyn prescribed him oxycodone-acetaminophen on 08/24/2020, providing 30 tablets with the allowance of 1-2 every six hours.   54 y.o. 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

## 2020-09-10 NOTE — Telephone Encounter (Signed)
Phoned patient. No answer. Left voicemail message explaining a refill of his Percocet has been electronically prescribed to [facility]. Provided my direct number for additional needs.

## 2020-09-11 ENCOUNTER — Telehealth: Payer: Self-pay | Admitting: Radiation Oncology

## 2020-09-11 NOTE — Telephone Encounter (Signed)
Received voicemail message from patient requesting return call. Phoned back to inquire. Patient questions if this RN knows anything about his paperwork. Explained his [employer] paperwork has been completed. Went onto explain it is Investment banker, operational on [employer], PA-C's desk to sign and then it will be faxed. Patient verbalized understanding and appreciation for the update.

## 2020-09-12 ENCOUNTER — Ambulatory Visit
Admission: RE | Admit: 2020-09-12 | Discharge: 2020-09-12 | Disposition: A | Discharge status: Home / Self Care | Payer: 59 | Source: Ambulatory Visit | Attending: Radiation Oncology | Admitting: Radiation Oncology

## 2020-09-12 DIAGNOSIS — Z510 Encounter for antineoplastic radiation therapy: Secondary | ICD-10-CM | POA: Diagnosis not present

## 2020-09-14 ENCOUNTER — Ambulatory Visit
Admission: RE | Admit: 2020-09-14 | Discharge: 2020-09-14 | Disposition: A | Discharge status: Home / Self Care | Payer: 59 | Source: Ambulatory Visit | Attending: Radiation Oncology | Admitting: Radiation Oncology

## 2020-09-14 ENCOUNTER — Encounter: Payer: Self-pay | Admitting: Urology

## 2020-09-14 DIAGNOSIS — Z510 Encounter for antineoplastic radiation therapy: Secondary | ICD-10-CM | POA: Diagnosis not present

## 2020-09-20 ENCOUNTER — Other Ambulatory Visit: Payer: Self-pay | Admitting: Radiation Oncology

## 2020-09-20 ENCOUNTER — Telehealth: Payer: Self-pay | Admitting: Radiation Oncology

## 2020-09-20 MED ORDER — OXYCODONE-ACETAMINOPHEN 5-325 MG PO TABS
1.0000 | ORAL_TABLET | Freq: Four times a day (QID) | ORAL | 0 refills | Status: DC | PRN
Start: 2020-09-20 — End: 2020-10-03

## 2020-09-20 MED ORDER — OXYCODONE-ACETAMINOPHEN 5-325 MG PO TABS
1.0000 | ORAL_TABLET | Freq: Four times a day (QID) | ORAL | 0 refills | Status: DC | PRN
Start: 2020-09-20 — End: 2020-09-20

## 2020-09-20 MED FILL — OXYCODONE-APAP 5-325MG: 5-325 | 8 days supply | Qty: 60 | Fill #0

## 2020-09-20 NOTE — Telephone Encounter (Signed)
Phoned patient to make him aware his percocet script awaits him with Chelsea at the front desk of the cancer center. Patient verbalized understanding and expressed appreciation for the help.  Patient questions the status of his FMLA paper. Explained to the patient the [employer] paperwork is complete and has been signed. Patient states, "they are telling me they haven't received it." Explained this RN will notify Mackie Pai, RN of this finding and request she re fax the paperwork to [employer]. Patient verbalized appreciation.

## 2020-09-21 ENCOUNTER — Telehealth: Payer: Self-pay | Admitting: Radiation Oncology

## 2020-09-21 NOTE — Telephone Encounter (Signed)
Phoned patient to circle back about FMLA paperwork for [employer] s/p conversation with Mackie Pai, Therapist, sports. Explained to the patient the FMLA paperwork progress would need to start over. Patient provided this RN with [employer]'s FMLA line of [phone]. This RN will follow up with [employer] then provide patient with an update the first of the coming week. Patient verbalized understanding of all reviewed.

## 2020-09-25 ENCOUNTER — Telehealth: Payer: Self-pay | Admitting: Radiation Oncology

## 2020-09-25 NOTE — Telephone Encounter (Signed)
Phebe Colla, NT hand delivered signed [employer] FMLA paperwork to [facility], RN.

## 2020-09-28 ENCOUNTER — Encounter: Payer: Self-pay | Admitting: Urology

## 2020-10-03 ENCOUNTER — Other Ambulatory Visit: Payer: Self-pay | Admitting: Radiation Oncology

## 2020-10-03 ENCOUNTER — Telehealth: Payer: Self-pay | Admitting: Radiation Oncology

## 2020-10-03 ENCOUNTER — Encounter: Payer: Self-pay | Admitting: Urology

## 2020-10-03 MED ORDER — OXYCODONE-ACETAMINOPHEN 5-325 MG PO TABS
1.0000 | ORAL_TABLET | Freq: Four times a day (QID) | ORAL | 0 refills | Status: DC | PRN
Start: 2020-10-03 — End: 2020-10-19

## 2020-10-03 MED FILL — OXYCODONE-APAP 5-325MG: 5-325 | 7 days supply | Qty: 60 | Fill #0

## 2020-10-03 NOTE — Telephone Encounter (Signed)
Phoned patient. Explained that Dr. Kyung Rudd refilled his pain medication. Explained that [place] confirmed receipt of the electronic script. Patient verbalized his intent to head to the pharmacy after our conversation.  Regarding the new lump under his chin, I recommended per Freeman Caldron, PA-C that he see Dr. Earlie Server and/or Dr. 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 Explained he could probably get in to see Cassie, PA-C with Dr. Earlie Server next week if unable to see Redmond Baseman quickly. Stressed that [facility] prefers for him to see Redmond Baseman if possible, in case he needs a biopsy. Patient verbalized understanding and expressed intent to phone Dr. Gloriann Loan office for an appointment.

## 2020-10-15 ENCOUNTER — Telehealth: Payer: Self-pay | Admitting: Radiation Oncology

## 2020-10-15 NOTE — Telephone Encounter (Signed)
Received voicemail message from patient requesting return call. Noted patient has a one month follow up appointment with Ashlyn Bruning, PA-C on 10/24/20 at 0900.   Patient requesting this appointment be changed from telephone to in person. Patient states, "I feel like someone needs to check me out from head to toe." Patient reports his ribs are feeling better since completing radiation but his back, abdomen and shoulders are hurting worse. In addition, patient reports feeling light headed with a poor appetite and loss of approximately 15 more pounds.   Patient reports the fastest he can get in to see Dr. Redmond Baseman about the new knot on his chin is 10/25/20 and he isn't scheduled with Julien Nordmann again until January.   Explained this RN would relay these findings to [facility] and phone him tomorrow with further directions. Patient verbalized understanding.

## 2020-10-16 NOTE — Telephone Encounter (Signed)
I would recommend that he be seen in the Symptom Management Clinic with Sandi Mealy, PA-C. -Ashlyn

## 2020-10-17 ENCOUNTER — Encounter: Payer: Self-pay | Admitting: Urology

## 2020-10-17 ENCOUNTER — Telehealth: Payer: Self-pay

## 2020-10-17 ENCOUNTER — Other Ambulatory Visit: Payer: Self-pay

## 2020-10-17 NOTE — Telephone Encounter (Signed)
Spoke with patient in regards to telephone appointment with Freeman Caldron PA on 10/24/20 @ 9:00am. Patient verbalized understanding of appointment date and time. Reviewed meaningful use questions.TM

## 2020-10-17 NOTE — Progress Notes (Signed)
Patient has 1 month telephone follow-up with Freeman Caldron PA on 10/24/20 @ 9:00am. Patient states that he is having severe hip and back pain. Patient states that he is taking Percocet for the pain but it only relieves the pain for 2 hours. Patient states that S. Presenell did reach out to him in regards to pain medication. Advised patient that I will notify S. Presenell RN in regards to pain status today.

## 2020-10-18 ENCOUNTER — Telehealth: Payer: Self-pay | Admitting: Radiation Oncology

## 2020-10-18 NOTE — Telephone Encounter (Signed)
Following multiple voicemail messages and several Epic patient advise messages this RN phoned the patient at home. I reassured him staff are working to meet his request.   I explained that I have sent the request to Dr. Tammi Klippel and Ailene Ards to refill his percocet. I explained that Abelina Bachelor, RN for Dr. Julien Nordmann is working to get him an appointment tomorrow in symptom management for a head to toe assessment. Also, I explained he will still follow up with Ashlyn Bruning, PA-C as scheduled on 10/24/20 at 0900. Reassure patient as soon as his refill is in I will notify him via phone.   Patient verbalized understanding of all reviewed.

## 2020-10-19 ENCOUNTER — Observation Stay (HOSPITAL_COMMUNITY)
Admission: EM | Admit: 2020-10-19 | Discharge: 2020-10-21 | Disposition: A | Discharge status: Home / Self Care | Payer: 59 | Attending: Internal Medicine | Admitting: Internal Medicine

## 2020-10-19 ENCOUNTER — Other Ambulatory Visit: Payer: Self-pay | Admitting: Urology

## 2020-10-19 ENCOUNTER — Observation Stay (HOSPITAL_COMMUNITY): Payer: 59

## 2020-10-19 ENCOUNTER — Other Ambulatory Visit: Payer: Self-pay

## 2020-10-19 ENCOUNTER — Encounter (HOSPITAL_COMMUNITY): Payer: Self-pay

## 2020-10-19 ENCOUNTER — Emergency Department (HOSPITAL_COMMUNITY): Payer: 59

## 2020-10-19 ENCOUNTER — Inpatient Hospital Stay: Payer: 59 | Attending: Internal Medicine | Admitting: Medical

## 2020-10-19 ENCOUNTER — Inpatient Hospital Stay: Payer: 59

## 2020-10-19 ENCOUNTER — Telehealth: Payer: Self-pay

## 2020-10-19 VITALS — BP 135/76 | HR 84 | Temp 98.1°F | Resp 20 | Ht 71.0 in | Wt 134.1 lb

## 2020-10-19 DIAGNOSIS — C321 Malignant neoplasm of supraglottis: Secondary | ICD-10-CM

## 2020-10-19 DIAGNOSIS — Z95828 Presence of other vascular implants and grafts: Secondary | ICD-10-CM

## 2020-10-19 DIAGNOSIS — C3490 Malignant neoplasm of unspecified part of unspecified bronchus or lung: Secondary | ICD-10-CM | POA: Diagnosis not present

## 2020-10-19 DIAGNOSIS — G893 Neoplasm related pain (acute) (chronic): Secondary | ICD-10-CM | POA: Diagnosis not present

## 2020-10-19 DIAGNOSIS — Z87891 Personal history of nicotine dependence: Secondary | ICD-10-CM | POA: Diagnosis not present

## 2020-10-19 DIAGNOSIS — I10 Essential (primary) hypertension: Secondary | ICD-10-CM | POA: Diagnosis not present

## 2020-10-19 DIAGNOSIS — Z20822 Contact with and (suspected) exposure to covid-19: Principal | ICD-10-CM | POA: Insufficient documentation

## 2020-10-19 DIAGNOSIS — R7989 Other specified abnormal findings of blood chemistry: Secondary | ICD-10-CM

## 2020-10-19 DIAGNOSIS — K831 Obstruction of bile duct: Secondary | ICD-10-CM

## 2020-10-19 DIAGNOSIS — M549 Dorsalgia, unspecified: Secondary | ICD-10-CM

## 2020-10-19 DIAGNOSIS — E876 Hypokalemia: Secondary | ICD-10-CM | POA: Diagnosis not present

## 2020-10-19 DIAGNOSIS — C3412 Malignant neoplasm of upper lobe, left bronchus or lung: Secondary | ICD-10-CM

## 2020-10-19 LAB — CBC WITH DIFFERENTIAL (CANCER CENTER ONLY)
Abs Immature Granulocytes: 0.02 10*3/uL (ref 0.00–0.07)
Basophils Absolute: 0 10*3/uL (ref 0.0–0.1)
Basophils Relative: 0 %
Eosinophils Absolute: 0.1 10*3/uL (ref 0.0–0.5)
Eosinophils Relative: 1 %
HCT: 35.8 % — ABNORMAL LOW (ref 39.0–52.0)
Hemoglobin: 11.6 g/dL — ABNORMAL LOW (ref 13.0–17.0)
Immature Granulocytes: 0 %
Lymphocytes Relative: 8 %
Lymphs Abs: 0.6 10*3/uL — ABNORMAL LOW (ref 0.7–4.0)
MCH: 31.5 pg (ref 26.0–34.0)
MCHC: 32.4 g/dL (ref 30.0–36.0)
MCV: 97.3 fL (ref 80.0–100.0)
Monocytes Absolute: 0.7 10*3/uL (ref 0.1–1.0)
Monocytes Relative: 9 %
Neutro Abs: 6.3 10*3/uL (ref 1.7–7.7)
Neutrophils Relative %: 82 %
Platelet Count: 334 10*3/uL (ref 150–400)
RBC: 3.68 MIL/uL — ABNORMAL LOW (ref 4.22–5.81)
RDW: 12.2 % (ref 11.5–15.5)
WBC Count: 7.7 10*3/uL (ref 4.0–10.5)
nRBC: 0 % (ref 0.0–0.2)

## 2020-10-19 LAB — CMP (CANCER CENTER ONLY)
ALT: 116 U/L — ABNORMAL HIGH (ref 0–44)
AST: 26 U/L (ref 15–41)
Albumin: 3 g/dL — ABNORMAL LOW (ref 3.5–5.0)
Alkaline Phosphatase: 370 U/L — ABNORMAL HIGH (ref 38–126)
Anion gap: 9 (ref 5–15)
BUN: 8 mg/dL (ref 6–20)
CO2: 34 mmol/L — ABNORMAL HIGH (ref 22–32)
Calcium: 14.1 mg/dL (ref 8.9–10.3)
Chloride: 101 mmol/L (ref 98–111)
Creatinine: 0.8 mg/dL (ref 0.61–1.24)
GFR, Estimated: >60 mL/min (ref >60)
Glucose, Bld: 106 mg/dL — ABNORMAL HIGH (ref 70–99)
Potassium: 3.1 mmol/L — ABNORMAL LOW (ref 3.5–5.1)
Sodium: 144 mmol/L (ref 135–145)
Total Bilirubin: 0.3 mg/dL (ref 0.3–1.2)
Total Protein: 6.3 g/dL — ABNORMAL LOW (ref 6.5–8.1)

## 2020-10-19 LAB — RESP PANEL BY RT-PCR (FLU A&B, COVID) ARPGX2
Influenza A by PCR: NEGATIVE
Influenza B by PCR: NEGATIVE
SARS Coronavirus 2 by RT PCR: NEGATIVE

## 2020-10-19 LAB — I-STAT CHEM 8, ED
BUN: 5 mg/dL — ABNORMAL LOW (ref 6–20)
Calcium, Ion: 1.75 mmol/L (ref 1.15–1.40)
Chloride: 101 mmol/L (ref 98–111)
Creatinine, Ser: 0.7 mg/dL (ref 0.61–1.24)
Glucose, Bld: 106 mg/dL — ABNORMAL HIGH (ref 70–99)
HCT: 33 % — ABNORMAL LOW (ref 39.0–52.0)
Hemoglobin: 11.2 g/dL — ABNORMAL LOW (ref 13.0–17.0)
Potassium: 2.8 mmol/L — ABNORMAL LOW (ref 3.5–5.1)
Sodium: 142 mmol/L (ref 135–145)
TCO2: 31 mmol/L (ref 22–32)

## 2020-10-19 MED ORDER — PANTOPRAZOLE SODIUM 40 MG PO TBEC
40.0000 mg | DELAYED_RELEASE_TABLET | Freq: Every day | ORAL | Status: DC
  Administered 2020-10-20 – 2020-10-21 (×2): 40 mg via ORAL
  Filled 2020-10-19 (×2): qty 1

## 2020-10-19 MED ORDER — OXYCODONE-ACETAMINOPHEN 5-325 MG PO TABS: 1.0000 | ORAL_TABLET | Freq: Four times a day (QID) | ORAL | 0 refills | Status: DC | PRN

## 2020-10-19 MED ORDER — ACETAMINOPHEN 325 MG PO TABS: 650.0000 mg | ORAL_TABLET | Freq: Four times a day (QID) | ORAL | Status: DC | PRN

## 2020-10-19 MED ORDER — SODIUM CHLORIDE 0.9% FLUSH
10.0000 mL | Freq: Once | INTRAVENOUS | Status: AC
  Administered 2020-10-19: 10 mL
  Filled 2020-10-19: qty 10

## 2020-10-19 MED ORDER — POTASSIUM CHLORIDE 10 MEQ/100ML IV SOLN
10.0000 meq | INTRAVENOUS | Status: AC
  Administered 2020-10-19 (×2): 10 meq via INTRAVENOUS
  Filled 2020-10-19 (×2): qty 100

## 2020-10-19 MED ORDER — ONDANSETRON HCL 4 MG PO TABS: 4.0000 mg | ORAL_TABLET | Freq: Four times a day (QID) | ORAL | Status: DC | PRN

## 2020-10-19 MED ORDER — ACETAMINOPHEN 650 MG RE SUPP: 650.0000 mg | Freq: Four times a day (QID) | RECTAL | Status: DC | PRN

## 2020-10-19 MED ORDER — SODIUM CHLORIDE 0.9 % IV BOLUS
2000.0000 mL | Freq: Once | INTRAVENOUS | Status: AC
  Administered 2020-10-19: 2000 mL via INTRAVENOUS

## 2020-10-19 MED ORDER — FLUTICASONE FUROATE-VILANTEROL 100-25 MCG/INH IN AEPB
1.0000 | INHALATION_SPRAY | Freq: Every day | RESPIRATORY_TRACT | Status: DC
  Administered 2020-10-20: 01:00:00 1 via RESPIRATORY_TRACT
  Filled 2020-10-19: qty 28

## 2020-10-19 MED ORDER — ZOLEDRONIC ACID 4 MG/5ML IV CONC
4.0000 mg | Freq: Once | INTRAVENOUS | Status: AC
  Administered 2020-10-19: 4 mg via INTRAVENOUS
  Filled 2020-10-19: qty 5

## 2020-10-19 MED ORDER — ONDANSETRON HCL 4 MG/2ML IJ SOLN: 4.0000 mg | Freq: Four times a day (QID) | INTRAMUSCULAR | Status: DC | PRN

## 2020-10-19 MED ORDER — OXYCODONE HCL 5 MG PO TABS: 5.0000 mg | ORAL_TABLET | Freq: Once | ORAL | Status: DC

## 2020-10-19 MED ORDER — SODIUM CHLORIDE (PF) 0.9 % IJ SOLN
INTRAMUSCULAR | Status: AC
  Filled 2020-10-19: qty 50

## 2020-10-19 MED ORDER — OXYCODONE-ACETAMINOPHEN 5-325 MG PO TABS
2.0000 | ORAL_TABLET | Freq: Four times a day (QID) | ORAL | Status: DC | PRN
  Administered 2020-10-19 – 2020-10-20 (×3): 2 via ORAL
  Filled 2020-10-19 (×3): qty 2

## 2020-10-19 MED ORDER — POTASSIUM CHLORIDE IN NACL 20-0.9 MEQ/L-% IV SOLN
Freq: Once | INTRAVENOUS | Status: DC
  Filled 2020-10-19: qty 1000

## 2020-10-19 MED ORDER — MORPHINE SULFATE (PF) 4 MG/ML IV SOLN
4.0000 mg | Freq: Once | INTRAVENOUS | Status: AC
Start: 2020-10-19 — End: 2020-10-19
  Administered 2020-10-19: 4 mg via INTRAVENOUS
  Filled 2020-10-19: qty 1

## 2020-10-19 MED ORDER — IOHEXOL 300 MG/ML  SOLN
100.0000 mL | Freq: Once | INTRAMUSCULAR | Status: AC | PRN
  Administered 2020-10-19: 100 mL via INTRAVENOUS

## 2020-10-19 MED ORDER — POTASSIUM CHLORIDE CRYS ER 20 MEQ PO TBCR
40.0000 meq | EXTENDED_RELEASE_TABLET | ORAL | Status: AC
  Administered 2020-10-19 (×2): 40 meq via ORAL
  Filled 2020-10-19 (×2): qty 2

## 2020-10-19 MED ORDER — ENOXAPARIN SODIUM 40 MG/0.4ML ~~LOC~~ SOLN
40.0000 mg | SUBCUTANEOUS | Status: DC
  Administered 2020-10-19 – 2020-10-20 (×2): 40 mg via SUBCUTANEOUS
  Filled 2020-10-19 (×2): qty 0.4

## 2020-10-19 NOTE — Progress Notes (Signed)
Symptoms Management Clinic Progress Note   Trevor Zavala [id number] August 05, 1966 54 y.o.  Trevor Zavala is managed by Dr. Fanny Bien. Trevor Zavala  Actively treated with chemotherapy/immunotherapy/hormonal therapy: Trevor Zavala completed 5 fractions of radiation therapy to a left fifth rib lesion on 09/14/2020 but has most recently been managed with conservative follow-up.  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  A bed was available in the ER before repletion of the patient's potassium could be started.  Pain secondary to to suspected neoplasm: A bed was made available for the patient in the ER prior to dosing with oxycodone as was initially planned.  Hypercalcemia: The patient's labs returned with a calcium of 14.1.  A bed was made available in the emergency room prior to beginning IV fluids on the patient.  I discussed with the patient's ER nurse that it may be difficult for the patient to receive bisphosphonates for his elevated calcium given his poor dentition.  Squamous cell carcinoma of the epiglottis: Patient's original diagnosis was in 2018.  He was treated with chemoradiation.  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  He previously was treated by Dr. Earlie Server and received concurrent chemoradiation with weekly carboplatin and paclitaxel.  This was first dosed on 06/29/2017.  He received 6 cycles with  his last dose given on 08/03/2017.  He was then treated with immunotherapy with Keytruda 200 mg IV every 3 weeks.  He was first dosed with this on 09/17/2017 and completed 35 cycles.  He was last seen by Dr. Julien Nordmann on 07/17/2020 and has been maintained with conservative observation.  He has now developed headaches and diffuse bone pain.  His labs today showed liver dysfunction.  He will likely need restaging CT scans of the chest, abdomen, pelvis, and head.   Please see After Visit Summary for patient specific instructions.  Future Appointments  Date Time Provider [place]  10/24/2020  9:00 AM Bruning, Ashlyn, PA-C [facility] None  11/02/2020 10:00 AM [facility] [place] [facility] None  11/16/2020  9:45 AM [facility] LAB [facility] None  11/19/2020  9:00 AM Curt Bears, MD [facility] None  01/04/2021 10:00 AM [facility] [place] [facility] None  03/08/2021 10:00 AM [facility] [place] [facility] None    No orders of the defined types were placed in this encounter.      Subjective:   Patient ID:  Trevor Zavala is a 54 y.o. (DOB 03/25/1966) male.  Chief Complaint: No chief complaint on file.   HPI Trevor Zavala  is a 54 y.o. 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  He presents to the clinic today with his wife.  He reports that he has been having head pain, bilateral shoulder pain, back pain, and bilateral hip pain.  He states that he  has been staggering and even passed out on Sunday at home.  He was seen yesterday by orthopedics for evaluation of his left hip pain.  He reports plans are to have an MRI of his left hip.  He has been having constipation and fatigue.  His appetite is stable.  He denies fevers, chills, or sweats.  He was last seen by Dr. Julien Nordmann on 07/17/2020 and had been maintained most recently on conservative observation.  He was noted  to have a new bone lesion in his left fifth rib and has completed 5 fractions of radiation therapy to his left fifth rib with his last treatment completed on 09/14/2020.  The patient's oncologic history is as follows:  DIAGNOSIS:  1) Stage IIB/IV (T3, N3, M1a) non-small cell lung cancer, squamous cell carcinoma presented with large left hilar mass in addition to mediastinal and left supraclavicular lymphadenopathy as well as contralateral right upper lobe nodule diagnosed in August 2018. PDL 1 expression: 90%. 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 First dose 06/29/2017. Status post 6 cycles. Last dose was given 08/03/2017. 2)  Immunotherapy with Ketruda 200 MG IV every 3 weeks, first dose 09/17/2017.  Status post 35 cycles.  CURRENT THERAPY: Observation.  Medications: I have reviewed the patient's current medications.  Allergies:  Allergies  Allergen Reactions  . Escitalopram Oxalate     "Bad thoughts" "Bad thoughts"    Past Medical History:  Diagnosis Date  . Anxiety   . COPD (chronic obstructive pulmonary disease) ([place])   . GERD (gastroesophageal reflux disease)   . Headache   . Hypertension   . Pneumonia   . Situational depression   . Stage III squamous cell carcinoma of left lung ([place]) 06/19/2017   Lungs & Epiglottis  . Tubular adenoma of colon 01/2015  . Tubular adenoma of colon 01/2015    Past Surgical History:  Procedure Laterality Date  . COLONOSCOPY    . DIRECT LARYNGOSCOPY N/A 06/25/2017   Procedure: DIRECT LARYNGOSCOPY AND BIOPSY;  Surgeon: Rozetta Nunnery, MD;  Location: [facility];  Service: ENT;  Laterality: N/A;  . EAR CYST EXCISION N/A 11/17/2013   Procedure: SEBACEOUS CYST CHEST;  Surgeon: Joyice Faster. Cornett, MD;  Location: [facility];  Service: General;  Laterality: N/A;  . IR  GASTROSTOMY TUBE MOD SED  08/10/2017  . IR GASTROSTOMY TUBE REMOVAL  09/29/2017  . KNEE ARTHROSCOPY     LEFT  . LIPOMA EXCISION N/A 11/17/2013   Procedure: EXCISION LIPOMA FOREHEAD;  Surgeon: Joyice Faster. Cornett, MD;  Location: [facility];  Service: General;  Laterality: N/A;  . LUNG BIOPSY Bilateral 06/12/2017   Procedure: LEFT LUNG BIOPSY;  Surgeon: Grace Isaac, MD;  Location: [facility];  Service: Thoracic;  Laterality: Bilateral;  . MICROLARYNGOSCOPY W/VOCAL CORD INJECTION Left 07/16/2018   Procedure: suspened micro laryngoscopy with jet ventilation and prolaryn injection;  Surgeon: Melida Quitter, MD;  Location: [facility];  Service: ENT;  Laterality: Left;  . PORTACATH PLACEMENT Right 07/01/2017   Procedure: INSERTION PORT-A-CATH - RIGHT IJ - placed with Fluoro and Ultrasound;  Surgeon: Grace Isaac, MD;  Location: [facility];  Service: Thoracic;  Laterality: Right;  Marland Kitchen VIDEO BRONCHOSCOPY WITH ENDOBRONCHIAL ULTRASOUND N/A 06/12/2017   Procedure: VIDEO BRONCHOSCOPY WITH ENDOBRONCHIAL ULTRASOUND;  Surgeon: Grace Isaac, MD;  Location: [facility];  Service: Thoracic;  Laterality: N/A;    Family History  Problem Relation Age of Onset  . Breast cancer Mother   . Hypertension Mother   . Colon cancer Mother   . Skin cancer Father   . Hypertension Father   . Colon polyps Father   . Cancer Father 37       b cell lymphoma  . Colon cancer Father   . Rectal cancer Neg Hx     Social History   Socioeconomic History  . Marital status: Married    Spouse name: Not on file  . Number of children: Not on file  . Years of education: Not on file  . Highest education level: Not on file  Occupational History  . Not on file  Tobacco Use  . Smoking status: Former Smoker    Packs/day: 0.25    Years: 35.00    Pack years: 8.75    Quit date: 08/10/2017    Years since quitting: 3.1  . Smokeless tobacco: Never Used  . Tobacco comment: down to 4 cig/day01/08/2018   Vaping Use  . Vaping Use:  Former  Substance and Sexual Activity  . Alcohol use: Yes    Alcohol/week: 0.0 standard drinks    Comment: SOCIAL  . Drug use: No  . Sexual activity: Yes  Other Topics Concern  . Not on file  Social History Narrative   Daily Caffeine Use:  3   Social Determinants of Health   Financial Resource Strain: Not on file  Food Insecurity: Not on file  Transportation Needs: Not on file  Physical Activity: Not on file  Stress: Not on file  Social Connections: Not on file  Intimate Partner Violence: Not on file    Past Medical History, Surgical history, Social history, and Family history were reviewed and updated as appropriate.   Please see review of systems for further details on the patient's review from today.   Review of Systems:  Review of Systems  Constitutional: Positive for fatigue. Negative for chills, diaphoresis and fever.  HENT: Negative for trouble swallowing and voice change.   Respiratory: Negative for cough, chest tightness, shortness of breath and wheezing.   Cardiovascular: Negative for chest pain and palpitations.  Gastrointestinal: Positive for constipation. Negative for abdominal pain, diarrhea, nausea and vomiting.  Genitourinary: Negative for difficulty urinating and dysuria.  Musculoskeletal: Positive for arthralgias and back pain. Negative for myalgias.  Neurological: Positive for syncope and weakness. Negative for dizziness, light-headedness and headaches.    Objective:   Physical Exam:  BP 135/76 (BP Location: Left Arm, Patient Position: Sitting)   Pulse 84   Temp 98.1 F (36.7 C) (Tympanic)   Resp 20   Ht 5\' 11"  (1.803 m)   Wt 134 lb 1.6 oz (60.8 kg)   SpO2 96%   BMI 18.70 kg/m  ECOG: 1  Physical Exam Constitutional:      General: He is not in acute distress.    Appearance: He is not ill-appearing, toxic-appearing or diaphoretic.  HENT:     Head: Normocephalic and atraumatic.  Eyes:     General: No scleral icterus.       Right eye: No  discharge.        Left eye: No discharge.  Cardiovascular:     Rate and Rhythm: Normal rate and regular rhythm.     Heart sounds: No murmur heard. No friction rub. No gallop.   Pulmonary:     Effort: Pulmonary effort is normal. No respiratory distress.  Breath sounds: Examination of the right-upper field reveals wheezing. Examination of the left-upper field reveals wheezing. Wheezing present. No rhonchi or rales.  Abdominal:     General: Bowel sounds are normal. There is no distension.     Tenderness: There is no abdominal tenderness. There is no guarding.  Musculoskeletal:     Right lower leg: No edema.     Left lower leg: No edema.  Skin:    General: Skin is warm and dry.     Coloration: Skin is not jaundiced.     Findings: No erythema.  Neurological:     Gait: Gait abnormal (The patient is ambulating with the use of a wheelchair).     Lab Review:     Component Value Date/Time   NA 144 10/19/2020 1310   NA 141 10/29/2017 1049   K 3.1 (L) 10/19/2020 1310   K 3.3 (L) 10/29/2017 1049   CL 101 10/19/2020 1310   CO2 34 (H) 10/19/2020 1310   CO2 26 10/29/2017 1049   GLUCOSE 106 (H) 10/19/2020 1310   GLUCOSE 146 (H) 10/29/2017 1049   BUN 8 10/19/2020 1310   BUN 11.2 10/29/2017 1049   CREATININE 0.80 10/19/2020 1310   CREATININE 0.7 10/29/2017 1049   CALCIUM 14.1 (HH) 10/19/2020 1310   CALCIUM 9.0 10/29/2017 1049   PROT 6.3 (L) 10/19/2020 1310   PROT 5.9 (L) 10/29/2017 1049   ALBUMIN 3.0 (L) 10/19/2020 1310   ALBUMIN 3.5 10/29/2017 1049   AST 26 10/19/2020 1310   AST 10 10/29/2017 1049   ALT 116 (H) 10/19/2020 1310   ALT 8 10/29/2017 1049   ALKPHOS 370 (H) 10/19/2020 1310   ALKPHOS 82 10/29/2017 1049   BILITOT 0.3 10/19/2020 1310   BILITOT 0.22 10/29/2017 1049   GFRNONAA >60 10/19/2020 1310   GFRAA >60 07/13/2020 1031       Component Value Date/Time   WBC 7.7 10/19/2020 1310   WBC 3.7 (L) 08/19/2018 1010   RBC 3.68 (L) 10/19/2020 1310   HGB 11.6 (L)  10/19/2020 1310   HGB 13.5 10/29/2017 1049   HCT 35.8 (L) 10/19/2020 1310   HCT 40.4 10/29/2017 1049   PLT 334 10/19/2020 1310   PLT 226 10/29/2017 1049   MCV 97.3 10/19/2020 1310   MCV 92.6 10/29/2017 1049   MCH 31.5 10/19/2020 1310   MCHC 32.4 10/19/2020 1310   RDW 12.2 10/19/2020 1310   RDW 13.3 10/29/2017 1049   LYMPHSABS 0.6 (L) 10/19/2020 1310   LYMPHSABS 0.5 (L) 10/29/2017 1049   MONOABS 0.7 10/19/2020 1310   MONOABS 0.3 10/29/2017 1049   EOSABS 0.1 10/19/2020 1310   EOSABS 0.1 10/29/2017 1049   BASOSABS 0.0 10/19/2020 1310   BASOSABS 0.0 10/29/2017 1049   -------------------------------  Imaging from last 24 hours (if applicable):  Radiology interpretation: No results found.

## 2020-10-19 NOTE — Telephone Encounter (Signed)
CRITICAL VALUE STICKER  CRITICAL VALUE: Calcium = 14.1  RECEIVER (on-site recipient of call): Yetta Glassman, CMA  DATE & TIME NOTIFIED: 10/19/20 at 1:45pm  MESSENGER (representative from lab): Suanne Marker  MD NOTIFIED: Sandi Mealy, PA-C  TIME OF NOTIFICATION: 1:50pm  RESPONSE: Pt is seeing Sandi Mealy, PA-C for an appt right now and will address with the pt.

## 2020-10-19 NOTE — ED Provider Notes (Signed)
[facility] DEPT Provider Note   CSN: [id number] Arrival date & time: 10/19/20  1443     History Chief Complaint  Patient presents with  . Abnormal Lab    Trevor Zavala is a 54 y.o. male history of COPD, hypertension, lung cancer with mets to the ribs here presenting with hypercalcemia. Patient states that he has not been feeling well for the last week or so. He states that he has been having rib pain and back pain pain. Has generalized weakness as well. Denies any chest pain or shortness of breath or fever. Went to oncology and labs showed calcium of 14 and he is sent for admission.    The history is provided by the patient.       Past Medical History:  Diagnosis Date  . Anxiety   . COPD (chronic obstructive pulmonary disease) ([place])   . GERD (gastroesophageal reflux disease)   . Headache   . Hypertension   . Pneumonia   . Situational depression   . 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 Tubular adenoma of colon 01/2015  . Tubular adenoma of colon 01/2015    Patient Active Problem List   Diagnosis Date Noted  . Metastasis to bone ([place]) 08/24/2020  . Acute upper respiratory infection 11/26/2019  . Anxiety and depression 01/25/2018  . Hoarseness of voice 01/25/2018  . Encounter for antineoplastic immunotherapy 10/08/2017  . Cancer, epiglottis ([place])   . Protein-calorie malnutrition, severe 08/09/2017  . Protein-calorie malnutrition, moderate ([place]) 08/07/2017  . Hypokalemia 08/07/2017  . SOB (shortness of breath) 08/07/2017  . Dehydration 08/07/2017  . Port catheter in place 08/03/2017  . 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 Encounter for antineoplastic chemotherapy 06/19/2017  . Goals of care, counseling/discussion 06/19/2017  . Lung mass 06/08/2017  . Hemoptysis 06/01/2017  . Weight loss  06/01/2017  . Colon polyps 01/16/2017  . Essential hypertension 01/16/2017  . Generalized anxiety disorder 06/22/2015  . Situational depression 02/03/2014  . Post-operative state 12/02/2013  . Infected epidermoid cyst 10/28/2013  . Lipoma of face 10/18/2013  . Well adult exam 10/07/2013  . Hand eczema 10/07/2013  . Wheezing on auscultation 06/01/2012  . Chest pain 06/01/2012  . Back pain 06/01/2012  . Sebaceous cyst 04/05/2012  . Contracture of palmar fascia (Dupuytren's) 04/05/2012  . Lipoma of forehead 04/05/2012  . Cold sore 03/19/2012  . Rhinitis, chronic 08/22/2011  . OTITIS MEDIA, MUCOID, CHRONIC 12/31/2010  . Dysphagia 08/23/2010  . TOBACCO USER 07/10/2010  . COPD mixed type ([place]) 07/10/2010  . GERD 07/10/2010  . Cough 07/10/2010    Past Surgical History:  Procedure Laterality Date  . COLONOSCOPY    . DIRECT LARYNGOSCOPY N/A 06/25/2017   Procedure: DIRECT LARYNGOSCOPY AND BIOPSY;  Surgeon: Rozetta Nunnery, MD;  Location: [facility];  Service: ENT;  Laterality: N/A;  . EAR CYST EXCISION N/A 11/17/2013   Procedure: SEBACEOUS CYST CHEST;  Surgeon: Joyice Faster. Cornett, MD;  Location: [facility];  Service: General;  Laterality: N/A;  . IR GASTROSTOMY TUBE MOD SED  08/10/2017  . IR GASTROSTOMY TUBE REMOVAL  09/29/2017  . KNEE ARTHROSCOPY     LEFT  . LIPOMA EXCISION N/A 11/17/2013   Procedure: EXCISION LIPOMA FOREHEAD;  Surgeon: Joyice Faster. Cornett, MD;  Location: [facility];  Service: General;  Laterality:  N/A;  . LUNG BIOPSY Bilateral 06/12/2017   Procedure: LEFT LUNG BIOPSY;  Surgeon: Grace Isaac, MD;  Location: [facility];  Service: Thoracic;  Laterality: Bilateral;  . MICROLARYNGOSCOPY W/VOCAL CORD INJECTION Left 07/16/2018   Procedure: suspened micro laryngoscopy with jet ventilation and prolaryn injection;  Surgeon: Melida Quitter, MD;  Location: [facility];  Service: ENT;  Laterality: Left;  . PORTACATH PLACEMENT Right 07/01/2017    Procedure: INSERTION PORT-A-CATH - RIGHT IJ - placed with Fluoro and Ultrasound;  Surgeon: Grace Isaac, MD;  Location: [facility];  Service: Thoracic;  Laterality: Right;  Marland Kitchen VIDEO BRONCHOSCOPY WITH ENDOBRONCHIAL ULTRASOUND N/A 06/12/2017   Procedure: VIDEO BRONCHOSCOPY WITH ENDOBRONCHIAL ULTRASOUND;  Surgeon: Grace Isaac, MD;  Location[facility];  Service: Thoracic;  Laterality: N/A;       Family History  Problem Relation Age of Onset  . Breast cancer Mother   . Hypertension Mother   . Colon cancer Mother   . Skin cancer Father   . Hypertension Father   . Colon polyps Father   . Cancer Father 51       b cell lymphoma  . Colon cancer Father   . Rectal cancer Neg Hx     Social History   Tobacco Use  . Smoking status: Former Smoker    Packs/day: 0.25    Years: 35.00    Pack years: 8.75    Quit date: 08/10/2017    Years since quitting: 3.1  . Smokeless tobacco: Never Used  . Tobacco comment: down to 4 cig/day01/08/2018   Vaping Use  . Vaping Use: Former  Substance Use Topics  . Alcohol use: Yes    Alcohol/week: 0.0 standard drinks    Comment: SOCIAL  . Drug use: No    Home Medications Prior to Admission medications   Medication Sig Start Date End Date Taking? Authorizing Provider  acyclovir (ZOVIRAX) 400 MG tablet Take 1 tablet (400 mg total) by mouth 3 (three) times daily. Patient not taking: Reported on 08/24/2020 11/25/19   Biagio Borg, MD  clonazePAM (KLONOPIN) 0.5 MG tablet TAKE 1 OR 2 TABLETS BY MOUTH TWICE A DAY AS NEEDED FOR ANXIETY 05/21/20   [name], [name], MD  esomeprazole (NEXIUM) 40 MG capsule TAKE 1 CAPSULE BY MOUTH EVERY DAY IN THE MORNING 08/15/20   [name], [name], MD  fexofenadine-pseudoephedrine (ALLEGRA-D 24) 180-240 MG 24 hr tablet Take 1 tablet by mouth daily.    [provider]  fluticasone furoate-vilanterol (BREO ELLIPTA) 100-25 MCG/INH AEPB Inhale 1 puff into the lungs daily. Annual appt is overdue must see provider for  future refills 09/10/20   [name], [name], MD  folic acid (FOLVITE) 1 MG tablet Take 1 mg by mouth daily.    [provider]  oxyCODONE-acetaminophen (PERCOCET/ROXICET) 5-325 MG tablet Take 1-2 tablets by mouth every 6 (six) hours as needed for severe pain (For Cancer Pain). 10/03/20   Kyung Rudd, MD    Allergies    Escitalopram oxalate  Review of Systems   Review of Systems  Musculoskeletal: Positive for back pain.       L hip pain   All other systems reviewed and are negative.   Physical Exam Updated Vital Signs BP 122/77 (BP Location: Left Arm)   Pulse 83   Temp 97.9 F (36.6 C) (Oral)   Resp (!) 21   Ht 5\' 10"  (1.778 m)   Wt 60.3 kg   SpO2 92%   BMI 19.08 kg/m  Physical Exam Vitals and nursing note reviewed.  Constitutional:      Comments: Chronically ill   HENT:     Head: Normocephalic.     Nose: Nose normal.     Mouth/Throat:     Mouth: Mucous membranes are dry.  Eyes:     Extraocular Movements: Extraocular movements intact.     Pupils: Pupils are equal, round, and reactive to light.  Cardiovascular:     Rate and Rhythm: Normal rate and regular rhythm.     Pulses: Normal pulses.     Heart sounds: Normal heart sounds.  Pulmonary:     Effort: Pulmonary effort is normal.     Breath sounds: Normal breath sounds.  Abdominal:     General: Abdomen is flat.     Palpations: Abdomen is soft.  Musculoskeletal:     Cervical back: Normal range of motion and neck supple.     Comments: Mild diffuse lower lumbar tenderness, mild L hip tenderness with no obvious deformity   Skin:    General: Skin is warm.     Capillary Refill: Capillary refill takes less than 2 seconds.  Neurological:     General: No focal deficit present.     Mental Status: He is oriented to person, place, and time.  Psychiatric:        Mood and Affect: Mood normal.        Behavior: Behavior normal.     ED Results / Procedures / Treatments   Labs (all labs ordered are listed,  but only abnormal results are displayed) Labs Reviewed  RESP PANEL BY RT-PCR (FLU A&B, COVID) [id number]  I-STAT CHEM 8, ED    EKG None  Radiology No results found.  Procedures Procedures (including critical care time)  CRITICAL CARE Performed by: Wandra Arthurs   Total critical care time:30  minutes  Critical care time was exclusive of separately billable procedures and treating other patients.  Critical care was necessary to treat or prevent imminent or life-threatening deterioration.  Critical care was time spent personally by me on the following activities: development of treatment plan with patient and/or surrogate as well as nursing, discussions with consultants, evaluation of patient's response to treatment, examination of patient, obtaining history from patient or surrogate, ordering and performing treatments and interventions, ordering and review of laboratory studies, ordering and review of radiographic studies, pulse oximetry and re-evaluation of patient's condition.   Medications Ordered in ED Medications  sodium chloride 0.9 % bolus 2,000 mL (has no administration in time range)  morphine 4 MG/ML injection 4 mg (has no administration in time range)    ED Course  I have reviewed the triage vital signs and the nursing notes.  Pertinent labs & imaging results that were available during my care of the patient were reviewed by me and considered in my medical decision making (see chart for details).    MDM Rules/Calculators/A&P                         KOLTYN KELSAY is a 54 y.o. male here with back pain, hip pain here with hypercalcemia. I think likely from worsening bone mets from his lung cancer. I reviewed labs prior to arrival. Calcium is 14 and he has no renal failure. Will give IVF and will get xrays. Will need admission for further management of hypercalcemia   6:24 PM Istat Calcium is 1.75. Given IVF. Xray showed no fractures. Hospitalist to admit for  hypercalcemia    Final Clinical Impression(s) / ED Diagnoses Final diagnoses:  None    Rx / DC Orders ED Discharge Orders    None       Drenda Freeze, MD 10/19/20 1825

## 2020-10-19 NOTE — ED Triage Notes (Signed)
Pt wheeled into ED from cancer center following abnormal lab draw. Of note, calcium is 14.1mg /dL, potassium 3.45mmol/L. Pt completed round of radiation on 09/14/20 to left 5th rib. Beginning last Sunday 12/5 pt experienced severe left hip pain, both shoulders, and back, fatigue, and weakness. Also endorses soreness in his rib cage. VS WNL at triage.

## 2020-10-19 NOTE — Patient Instructions (Signed)

## 2020-10-19 NOTE — H&P (Signed)
History and Physical    Trevor Zavala OVF:[id number] DOB: 28-Dec-1965 DOA: 10/19/2020  I have briefly reviewed the patient's prior medical records in [place]  PCP: [name], [name], MD  Patient coming from: Cancer center  Chief Complaint: Sent for hypercalcemia  HPI: Trevor Zavala is a 54 y.o. male with medical history significant of prior tobacco use, COPD, hypertension, metastatic lung cancer comes to the hospital sent from the cancer center due to hypercalcemia on labs today.  He is seen by Dr. Earlie Server as an outpatient.  Initially he was treated by Dr. Earlie Server and received chemoradiation in 2018 as well as immunotherapy, completed total of 35 cycles.  Last seen by Dr. Earlie Server in September and has been maintained on conservative observation.  He was recently found to have metastatic lesion to his left ribs and completed 5 fractions of radiation therapy November 2021.  Of note, he also has a history of squamous cell carcinoma of epiglottis.  Today patient tells me that he has been feeling significantly weak over the last several days to 1 week, having a harder time to walk due to hip pain and using a cane.  He has been complaining of constipation.  He denies any chest pain, denies any shortness of breath.  No abdominal pain, no nausea or vomiting.  He denies any confusion.  Complains of occasional lightheadedness.   ED Course: In the ED he is afebrile 98.1, vitals are generally stable and a satting well on room air.  His blood work shows a potassium of 2.8, calcium is 14.1 with an albumin of 3.0. Calcium is 14.9.  His CBC shows mild anemia with hemoglobin of 11.2.  He was started on IV fluids and we are asked to admit for hypercalcemia of malignancy.  Review of Systems: All systems reviewed, and apart from HPI, all negative  Past Medical History:  Diagnosis Date  . Anxiety   . COPD (chronic obstructive pulmonary disease) ([place])   . GERD (gastroesophageal reflux disease)    . Headache   . Hypertension   . Pneumonia   . Situational depression   . 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 Tubular adenoma of colon 01/2015  . Tubular adenoma of colon 01/2015    Past Surgical History:  Procedure Laterality Date  . COLONOSCOPY    . DIRECT LARYNGOSCOPY N/A 06/25/2017   Procedure: DIRECT LARYNGOSCOPY AND BIOPSY;  Surgeon: Rozetta Nunnery, MD;  Location: [facility];  Service: ENT;  Laterality: N/A;  . EAR CYST EXCISION N/A 11/17/2013   Procedure: SEBACEOUS CYST CHEST;  Surgeon: Joyice Faster. Cornett, MD;  Location: [facility];  Service: General;  Laterality: N/A;  . IR GASTROSTOMY TUBE MOD SED  08/10/2017  . IR GASTROSTOMY TUBE REMOVAL  09/29/2017  . KNEE ARTHROSCOPY     LEFT  . LIPOMA EXCISION N/A 11/17/2013   Procedure: EXCISION LIPOMA FOREHEAD;  Surgeon: Joyice Faster. Cornett, MD;  Location: [facility];  Service: General;  Laterality: N/A;  . LUNG BIOPSY Bilateral 06/12/2017   Procedure: LEFT LUNG BIOPSY;  Surgeon: Grace Isaac, MD;  Location: [facility];  Service: Thoracic;  Laterality: Bilateral;  . MICROLARYNGOSCOPY W/VOCAL CORD INJECTION Left 07/16/2018   Procedure: suspened micro laryngoscopy with jet ventilation and prolaryn injection;  Surgeon: Melida Quitter, MD;  Location: [facility];  Service: ENT;  Laterality: Left;  . PORTACATH PLACEMENT Right 07/01/2017   Procedure:  INSERTION PORT-A-CATH - RIGHT IJ - placed with Fluoro and Ultrasound;  Surgeon: Grace Isaac, MD;  Location: [facility];  Service: Thoracic;  Laterality: Right;  Marland Kitchen VIDEO BRONCHOSCOPY WITH ENDOBRONCHIAL ULTRASOUND N/A 06/12/2017   Procedure: VIDEO BRONCHOSCOPY WITH ENDOBRONCHIAL ULTRASOUND;  Surgeon: Grace Isaac, MD;  Location: [facility];  Service: Thoracic;  Laterality: N/A;     reports that he quit smoking about 3 years ago. He has a 8.75 pack-year smoking history. He has never used smokeless tobacco. He  reports current alcohol use. He reports that he does not use drugs.  Allergies  Allergen Reactions  . Escitalopram Oxalate     "Bad thoughts" "Bad thoughts"    Family History  Problem Relation Age of Onset  . Breast cancer Mother   . Hypertension Mother   . Colon cancer Mother   . Skin cancer Father   . Hypertension Father   . Colon polyps Father   . Cancer Father 82       b cell lymphoma  . Colon cancer Father   . Rectal cancer Neg Hx     Prior to Admission medications   Medication Sig Start Date End Date Taking? Authorizing Provider  acyclovir (ZOVIRAX) 400 MG tablet Take 1 tablet (400 mg total) by mouth 3 (three) times daily. Patient not taking: Reported on 08/24/2020 11/25/19   Biagio Borg, MD  clonazePAM (KLONOPIN) 0.5 MG tablet TAKE 1 OR 2 TABLETS BY MOUTH TWICE A DAY AS NEEDED FOR ANXIETY 05/21/20   [name], [name], MD  esomeprazole (NEXIUM) 40 MG capsule TAKE 1 CAPSULE BY MOUTH EVERY DAY IN THE MORNING 08/15/20   [name], [name], MD  fexofenadine-pseudoephedrine (ALLEGRA-D 24) 180-240 MG 24 hr tablet Take 1 tablet by mouth daily.    [provider]  fluticasone furoate-vilanterol (BREO ELLIPTA) 100-25 MCG/INH AEPB Inhale 1 puff into the lungs daily. Annual appt is overdue must see provider for future refills 09/10/20   [name], [name], MD  folic acid (FOLVITE) 1 MG tablet Take 1 mg by mouth daily.    [provider]  oxyCODONE-acetaminophen (PERCOCET/ROXICET) 5-325 MG tablet Take 1-2 tablets by mouth every 6 (six) hours as needed for severe pain (For Cancer Pain). 10/19/20   Freeman Caldron, PA-C    Physical Exam: Vitals:   10/19/20 1458 10/19/20 1500 10/19/20 1600 10/19/20 1715  BP: 122/77 116/75 118/73 125/78  Pulse: 83 80 78 81  Resp: (!) 21 (!) 23 (!) 22 20  Temp: 97.9 F (36.6 C)     TempSrc: Oral     SpO2: 92% 92% 92% 93%  Weight:  60.3 kg    Height:  5\' 10"  (1.778 m)     Constitutional: NAD, calm, comfortable Eyes: PERRL,  lids and conjunctivae normal ENMT: Mucous membranes are moist.  Neck: normal, supple Respiratory: clear to auscultation bilaterally, no wheezing, no crackles. Normal respiratory effort. No accessory muscle use.  Cardiovascular: Regular rate and rhythm, no murmurs / rubs / gallops. No extremity edema. Abdomen: no tenderness, no masses palpated. Bowel sounds positive.  Musculoskeletal: no clubbing / cyanosis. Normal muscle tone.  Skin: no rashes, lesions, ulcers. No induration Neurologic: CN 2-12 grossly intact. Strength 5/5 in all 4.  Psychiatric: Normal judgment and insight. Alert and oriented x 3. Normal mood.   Labs on Admission: I have personally reviewed following labs and imaging studies  CBC: Recent Labs  Lab 10/19/20 1310 10/19/20 1724  WBC 7.7  --   NEUTROABS 6.3  --  HGB 11.6* 11.2*  HCT 35.8* 33.0*  MCV 97.3  --   PLT 334  --    Basic Metabolic Panel: Recent Labs  Lab 10/19/20 1310 10/19/20 1724  NA 144 142  K 3.1* 2.8*  CL 101 101  CO2 34*  --   GLUCOSE 106* 106*  BUN 8 5*  CREATININE 0.80 0.70  CALCIUM 14.1*  --    Liver Function Tests: Recent Labs  Lab 10/19/20 1310  AST 26  ALT 116*  ALKPHOS 370*  BILITOT 0.3  PROT 6.3*  ALBUMIN 3.0*   Coagulation Profile: No results for input(s): INR, PROTIME in the last 168 hours. BNP (last 3 results) No results for input(s): PROBNP in the last 8760 hours. CBG: No results for input(s): GLUCAP in the last 168 hours. Thyroid Function Tests: No results for input(s): TSH, T4TOTAL, FREET4, T3FREE, THYROIDAB in the last 72 hours. Urine analysis:    Component Value Date/Time   COLORURINE YELLOW 03/19/2018 1139   APPEARANCEUR CLEAR 03/19/2018 1139   LABSPEC >=1.030 (A) 03/19/2018 1139   PHURINE 6.0 03/19/2018 1139   GLUCOSEU NEGATIVE 03/19/2018 1139   HGBUR NEGATIVE 03/19/2018 1139   BILIRUBINUR SMALL (A) 03/19/2018 1139   KETONESUR NEGATIVE 03/19/2018 1139   UROBILINOGEN 0.2 03/19/2018 1139   NITRITE  NEGATIVE 03/19/2018 1139   LEUKOCYTESUR NEGATIVE 03/19/2018 1139     Radiological Exams on Admission: DG Chest 2 View  Result Date: 10/19/2020 CLINICAL DATA:  Rib pain.  History of bone metastasis. EXAM: CHEST - 2 VIEW COMPARISON:  Chest x-ray 07/30/2020.  CT 07/13/2020, 03/02/2020. FINDINGS: PowerPort catheter noted with tip over the right atrium. Heart size normal. Stable left perihilar and left mid lung densities again noted, most consistent with scarring. Stable nodular opacities left upper lung. No acute infiltrate. Mild left base pleural thickening. No prominent pleural effusion. No pneumothorax. No acute bony abnormality identified. Degenerative change thoracic spine. IMPRESSION: 1. PowerPort catheter noted with tip over right atrium. 2. Stable left perihilar and left mid lung densities again noted, most consistent with scarring. Stable nodular opacities left upper lung. No acute infiltrate. Electronically Signed   By: Marcello Moores  Register   On: 10/19/2020 17:03   DG Lumbar Spine Complete  Result Date: 10/19/2020 CLINICAL DATA:  Abnormal lab draw severe left hip pain back pain EXAM: LUMBAR SPINE - COMPLETE 4+ VIEW COMPARISON:  CT 05/11/2020 FINDINGS: Lumbar alignment within normal limits. Vertebral body heights are maintained. Mild degenerative changes at multiple levels. Mild facet degenerative changes of the lower lumbar spine. Aortic atherosclerosis IMPRESSION: Mild degenerative change. Electronically Signed   By: Donavan Foil M.D.   On: 10/19/2020 17:01   DG Hip Unilat W or Wo Pelvis 2-3 Views Left  Result Date: 10/19/2020 CLINICAL DATA:  Severe hip pain EXAM: DG HIP (WITH OR WITHOUT PELVIS) 2-3V LEFT COMPARISON:  CT 05/11/2020, pelvis radiograph 10/18/2020 FINDINGS: SI joints are patent. Pubic symphysis and rami appear intact. No fracture or malalignment. The joint space appears maintained IMPRESSION: Negative. Electronically Signed   By: Donavan Foil M.D.   On: 10/19/2020 17:02     EKG: Independently reviewed. Done but unable to physically find. Will repeat and review when done  Assessment/Plan  Principal Problem Hypercalcemia of malignancy -Admit patient to the hospital, he will be provided with aggressive IV fluid hydration overnight at 200 cc an hour, reevaluate fluid status in the morning and potentially follow with Lasix -We will administer zoledronic acid -Repeat calcium in the morning -Restage today with CT chest,  abdomen, pelvis as well as CT of the head  Active Problems Metastatic lung cancer -With cancer related pain, somewhat poorly controlled at home.  Will place on Percocet and monitor  Hypokalemia -Replete and monitor  Anemia -Likely in the setting of malignancy.  No bleeding.  Elevated LFTs -Asymptomatic, will obtain imaging with CT scan of the abdomen pelvis part of the metastatic evaluation as above  DVT prophylaxis: Lovenox  Code Status: Full code Family Communication: Wife present at bedside Disposition Plan: Home when ready based on calcium levels Bed Type: Telemetry Consults called: None Obs/Inp: Observation   Marzetta Board, MD, PhD [facility]  Contact via [email or web address]  10/19/2020, 5:49 PM

## 2020-10-19 NOTE — ED Notes (Signed)
Copy of Chem 8 results given to Dr Darl Householder

## 2020-10-19 NOTE — ED Notes (Signed)
Hourly rounding complete. Pt resting comfortably with bed in lowest position and side rails up x2. Vitals updated. All needs met at this time. Will continue to monitor. 

## 2020-10-20 ENCOUNTER — Observation Stay (HOSPITAL_COMMUNITY): Payer: 59

## 2020-10-20 DIAGNOSIS — M5440 Lumbago with sciatica, unspecified side: Secondary | ICD-10-CM | POA: Diagnosis not present

## 2020-10-20 DIAGNOSIS — R7989 Other specified abnormal findings of blood chemistry: Secondary | ICD-10-CM

## 2020-10-20 LAB — COMPREHENSIVE METABOLIC PANEL
ALT: 113 U/L — ABNORMAL HIGH (ref 0–44)
AST: 88 U/L — ABNORMAL HIGH (ref 15–41)
Albumin: 2.6 g/dL — ABNORMAL LOW (ref 3.5–5.0)
Alkaline Phosphatase: 376 U/L — ABNORMAL HIGH (ref 38–126)
Anion gap: 8 (ref 5–15)
BUN: 7 mg/dL (ref 6–20)
CO2: 29 mmol/L (ref 22–32)
Calcium: 11.5 mg/dL — ABNORMAL HIGH (ref 8.9–10.3)
Chloride: 105 mmol/L (ref 98–111)
Creatinine, Ser: 0.7 mg/dL (ref 0.61–1.24)
GFR, Estimated: >60 mL/min (ref >60)
Glucose, Bld: 93 mg/dL (ref 70–99)
Potassium: 3.7 mmol/L (ref 3.5–5.1)
Sodium: 142 mmol/L (ref 135–145)
Total Bilirubin: 0.5 mg/dL (ref 0.3–1.2)
Total Protein: 5.1 g/dL — ABNORMAL LOW (ref 6.5–8.1)

## 2020-10-20 LAB — CBC
HCT: 33 % — ABNORMAL LOW (ref 39.0–52.0)
Hemoglobin: 10.5 g/dL — ABNORMAL LOW (ref 13.0–17.0)
MCH: 32.3 pg (ref 26.0–34.0)
MCHC: 31.8 g/dL (ref 30.0–36.0)
MCV: 101.5 fL — ABNORMAL HIGH (ref 80.0–100.0)
Platelets: 257 10*3/uL (ref 150–400)
RBC: 3.25 MIL/uL — ABNORMAL LOW (ref 4.22–5.81)
RDW: 12.1 % (ref 11.5–15.5)
WBC: 4.7 10*3/uL (ref 4.0–10.5)
nRBC: 0 % (ref 0.0–0.2)

## 2020-10-20 MED ORDER — CHLORHEXIDINE GLUCONATE CLOTH 2 % EX PADS
6.0000 | MEDICATED_PAD | Freq: Every day | CUTANEOUS | Status: DC
  Administered 2020-10-20: 17:00:00 6 via TOPICAL

## 2020-10-20 MED ORDER — FOLIC ACID 1 MG PO TABS
1.0000 mg | ORAL_TABLET | Freq: Every day | ORAL | Status: DC
  Administered 2020-10-20 – 2020-10-21 (×2): 1 mg via ORAL
  Filled 2020-10-20 (×2): qty 1

## 2020-10-20 MED ORDER — GADOBUTROL 1 MMOL/ML IV SOLN
6.0000 mL | Freq: Once | INTRAVENOUS | Status: AC | PRN
  Administered 2020-10-20: 09:00:00 6 mL via INTRAVENOUS

## 2020-10-20 MED ORDER — SENNOSIDES-DOCUSATE SODIUM 8.6-50 MG PO TABS
2.0000 | ORAL_TABLET | Freq: Two times a day (BID) | ORAL | Status: DC
  Administered 2020-10-20 – 2020-10-21 (×2): 2 via ORAL
  Filled 2020-10-20 (×2): qty 2

## 2020-10-20 MED ORDER — FUROSEMIDE 10 MG/ML IJ SOLN
40.0000 mg | Freq: Once | INTRAMUSCULAR | Status: AC
  Administered 2020-10-20: 07:00:00 40 mg via INTRAVENOUS
  Filled 2020-10-20: qty 4

## 2020-10-20 MED ORDER — FUROSEMIDE 10 MG/ML IJ SOLN
40.0000 mg | Freq: Two times a day (BID) | INTRAMUSCULAR | Status: DC
  Administered 2020-10-20 – 2020-10-21 (×2): 40 mg via INTRAVENOUS
  Filled 2020-10-20 (×2): qty 4

## 2020-10-20 MED ORDER — OXYCODONE HCL 5 MG PO TABS
10.0000 mg | ORAL_TABLET | ORAL | Status: DC | PRN
  Administered 2020-10-20 – 2020-10-21 (×6): 10 mg via ORAL
  Filled 2020-10-20 (×6): qty 2

## 2020-10-20 MED ORDER — SODIUM CHLORIDE 0.9 % IV SOLN: INTRAVENOUS | Status: DC

## 2020-10-20 MED ORDER — FLUTICASONE FUROATE-VILANTEROL 100-25 MCG/INH IN AEPB
1.0000 | INHALATION_SPRAY | Freq: Every day | RESPIRATORY_TRACT | Status: DC
  Administered 2020-10-21: 08:00:00 1 via RESPIRATORY_TRACT

## 2020-10-20 NOTE — Evaluation (Signed)
Physical Therapy Evaluation Patient Details Name: Trevor Zavala MRN: [id number] DOB: 07/26/1966 Today's Date: 10/20/2020   History of Present Illness  54 year old male with prior tobacco use, COPD, HTN, metastatic lung cancer who was admitted directly from cancer center due to hypercalcemia, weakness, constipation.  Clinical Impression  Pt admitted with above diagnosis.  Pt currently with functional limitations due to the deficits listed below (see PT Problem List). Pt will benefit from skilled PT to increase their independence and safety with mobility to allow discharge to the venue listed below.  Pt ambulated > 300' with o2 96% on room air.  He liked the RW for pain management vs the cane he has been using at home, although he was using in the wrong hand at home. Educated on proper technique with cane and how to manage stairs with rail.  Recommend RW for home use.  Will follow acutely for continued gait and stair training, but no follow up PT needed.     Follow Up Recommendations No PT follow up    Equipment Recommendations  Rolling walker with 5" wheels    Recommendations for Other Services       Precautions / Restrictions        Mobility  Bed Mobility Overal bed mobility: Modified Independent                  Transfers Overall transfer level: Needs assistance   Transfers: Sit to/from Stand Sit to Stand: Supervision         General transfer comment: cues for hand placement  Ambulation/Gait Ambulation/Gait assistance: Supervision Gait Distance (Feet): 350 Feet Assistive device: Rolling walker (2 wheeled) Gait Pattern/deviations: Decreased stance time - left;Decreased step length - right Gait velocity: decreased   General Gait Details: Pt reports some pain with L WB, but feels the RW is helping. o2 96% on RA with gait.  Stairs            Wheelchair Mobility    Modified Rankin (Stroke Patients Only)       Balance Overall balance assessment:  Needs assistance   Sitting balance-Leahy Scale: Normal     Standing balance support: During functional activity Standing balance-Leahy Scale: Fair                               Pertinent Vitals/Pain Pain Assessment: 0-10 Pain Score: 6  Pain Location: L hip with gait Pain Descriptors / Indicators: Sore Pain Intervention(s): Monitored during session;Premedicated before session;Limited activity within patient's tolerance    Home Living Family/patient expects to be discharged to:: Private residence Living Arrangements: Spouse/significant other Available Help at Discharge: Family Type of Home: House Home Access: Stairs to enter   Technical brewer of Steps: 3 Home Layout: One level Home Equipment: Cane - single point      Prior Function Level of Independence: Independent               Hand Dominance        Extremity/Trunk Assessment   Upper Extremity Assessment Upper Extremity Assessment: Overall WFL for tasks assessed    Lower Extremity Assessment Lower Extremity Assessment: Overall WFL for tasks assessed       Communication   Communication: No difficulties  Cognition Arousal/Alertness: Awake/alert Behavior During Therapy: WFL for tasks assessed/performed Overall Cognitive Status: Within Functional Limits for tasks assessed  General Comments      Exercises     Assessment/Plan    PT Assessment Patient needs continued PT services  PT Problem List Decreased balance;Decreased mobility;Decreased coordination;Pain       PT Treatment Interventions DME instruction;Gait training;Stair training;Functional mobility training;Therapeutic activities;Therapeutic exercise;Balance training;Patient/family education    PT Goals (Current goals can be found in the Care Plan section)  Acute Rehab PT Goals Patient Stated Goal: go home PT Goal Formulation: With patient Time For Goal Achievement:  11/03/20 Potential to Achieve Goals: Good    Frequency Min 3X/week   Barriers to discharge        Co-evaluation               AM-PAC PT "6 Clicks" Mobility  Outcome Measure Help needed turning from your back to your side while in a flat bed without using bedrails?: None Help needed moving from lying on your back to sitting on the side of a flat bed without using bedrails?: None Help needed moving to and from a bed to a chair (including a wheelchair)?: None Help needed standing up from a chair using your arms (e.g., wheelchair or bedside chair)?: None Help needed to walk in hospital room?: A Little Help needed climbing 3-5 steps with a railing? : A Little 6 Click Score: 22    End of Session   Activity Tolerance: Patient tolerated treatment well Patient left: in chair;with call bell/phone within reach Nurse Communication: Mobility status PT Visit Diagnosis: Difficulty in walking, not elsewhere classified (R26.2);Pain Pain - Right/Left: Left Pain - part of body: Hip    Time: 1610-9604 PT Time Calculation (min) (ACUTE ONLY): 25 min   Charges:   PT Evaluation $PT Eval Low Complexity: 1 Low PT Treatments $Gait Training: 8-22 mins        Ahmiya Abee L. Tamala Julian, [place] Pager [phone] 10/20/2020   Trevor Zavala 10/20/2020, 3:25 PM

## 2020-10-20 NOTE — Progress Notes (Signed)
PROGRESS NOTE  TRACEY STEWART GUY:[id number] DOB: [birth year] DOA: 10/19/2020 PCP: Cassandria Anger, MD   LOS: 0 days   Brief Narrative / Interim history: 54 year old male with prior tobacco use, COPD, HTN, metastatic lung cancer who was admitted directly from cancer center due to hypercalcemia, weakness, constipation.  Subjective / 24h Interval events: He is doing a little better this morning, feels stronger.  No abdominal pain, no nausea or vomiting.  Assessment & Plan: Principal Problem Hypercalcemia of malignancy, stage IV lung cancer -Patient with significant elevation of his calcium, 14.9 on admission (corrected).  He received zoledronic acid and aggressive fluid hydration -Calcium this morning is 12.6 (corrected), still significantly elevated -Continue aggressive IV fluids, follow with Lasix to avoid fluid overload -Due to worsening of his back and chest pain as well as a bump on his forehead, underwent CT scan of the brain, chest, abdomen pelvis which showed multiple metastatic lesions.  Defer treatment to outpatient oncology  Active Problems Hypokalemia -Potassium improved to 3.7 this morning  Elevated LFTs -Initial CT scan showed a very abnormal appearance of the gallbladder with wall thickening and concern for CBD filling defect.  He is asymptomatic.  He underwent an ultrasound and an MRI/MRCP without evidence of choledocholithiasis, no biliary obstruction, and gallbladder findings are nonspecific favoring chronic inflammation.  Given lack of symptoms this can be further followed up as an outpatient  Anemia -In the setting of malignancy, no bleeding, monitor hemoglobin  Scheduled Meds: . enoxaparin (LOVENOX) injection  40 mg Subcutaneous Q24H  . fluticasone furoate-vilanterol  1 puff Inhalation Daily  . folic acid  1 mg Oral Daily  . furosemide  40 mg Intravenous Q12H  . pantoprazole  40 mg Oral Daily  . senna-docusate  2 tablet Oral BID   Continuous  Infusions: . sodium chloride Stopped (10/20/20 0808)   PRN Meds:.acetaminophen **OR** acetaminophen, ondansetron **OR** ondansetron (ZOFRAN) IV, oxyCODONE-acetaminophen  Diet Orders (From admission, onward)    Start     Ordered   10/19/20 1954  Diet regular Room service appropriate? Yes; Fluid consistency: Thin  Diet effective now       Question Answer Comment  Room service appropriate? Yes   Fluid consistency: Thin      10/19/20 1953          DVT prophylaxis: enoxaparin (LOVENOX) injection 40 mg Start: 10/19/20 2200     Code Status: Full Code  Family Communication: wife at bedside   Status is: Observation  The patient will require care spanning > 2 midnights and should be moved to inpatient because: Persistent severe electrolyte disturbances and IV treatments appropriate due to intensity of illness or inability to take PO  Dispo: The patient is from: Home              Anticipated d/c is to: Home              Anticipated d/c date is: 1 day              Patient currently is not medically stable to d/c.   Consultants:  None   Procedures:  None   Microbiology  None   Antimicrobials: None     Objective: Vitals:   10/20/20 0630 10/20/20 0645 10/20/20 0700 10/20/20 0907  BP: 116/73 128/82 128/81 119/73  Pulse: 64 69 67 74  Resp: _0 (!) 23  Temp:    97.9 F (36.6 C)  TempSrc:    Oral  SpO2: 98% 100% 99% 100%  Weight:      Height:       No intake or output data in the 24 hours ending 10/20/20 1126 Filed Weights   10/19/20 1500  Weight: 60.3 kg    Examination:  Constitutional: NAD Eyes: no scleral icterus ENMT: Mucous membranes are moist.  Neck: normal, supple Respiratory: clear to auscultation bilaterally, no wheezing, no crackles. Normal respiratory effort. No accessory muscle use.  Cardiovascular: Regular rate and rhythm, no murmurs / rubs / gallops. No LE edema.  Abdomen: non distended, no tenderness. Bowel sounds positive.   Musculoskeletal: no clubbing / cyanosis.  Skin: no rashes Neurologic: CN 2-12 grossly intact. Strength 5/5 in all 4.   Data Reviewed: I have independently reviewed following labs and imaging studies   CBC: Recent Labs  Lab 10/19/20 1310 10/19/20 1724 10/20/20 0504  WBC 7.7  --  4.7  NEUTROABS 6.3  --   --   HGB 11.6* 11.2* 10.5*  HCT 35.8* 33.0* 33.0*  MCV 97.3  --  101.5*  PLT 334  --  350   Basic Metabolic Panel: Recent Labs  Lab 10/19/20 1310 10/19/20 1724 10/20/20 0504  NA 144 142 142  K 3.1* 2.8* 3.7  CL 101 101 105  CO2 34*  --  29  GLUCOSE 106* 106* 93  BUN 8 5* 7  CREATININE 0.80 0.70 0.70  CALCIUM 14.1*  --  11.5*   Liver Function Tests: Recent Labs  Lab 10/19/20 1310 10/20/20 0504  AST 26 88*  ALT 116* 113*  ALKPHOS 370* 376*  BILITOT 0.3 0.5  PROT 6.3* 5.1*  ALBUMIN 3.0* 2.6*   Coagulation Profile: No results for input(s): INR, PROTIME in the last 168 hours. HbA1C: No results for input(s): HGBA1C in the last 72 hours. CBG: No results for input(s): GLUCAP in the last 168 hours.  Recent Results (from the past 240 hour(s))  Resp Panel by RT-PCR (Flu A&B, Covid) Nasopharyngeal Swab     Status: None   Collection Time: 10/19/20  5:05 PM   Specimen: Nasopharyngeal Swab; Nasopharyngeal(NP) swabs in vial transport medium  Result Value Ref Range Status   SARS Coronavirus 2 by RT PCR NEGATIVE NEGATIVE Final    Comment: (NOTE) SARS-CoV-2 target nucleic acids are NOT DETECTED.  The SARS-CoV-2 RNA is generally detectable in upper respiratory specimens during the acute phase of infection. The lowest concentration of SARS-CoV-2 viral copies this assay can detect is 138 copies/mL. A negative result does not preclude SARS-Cov-2 infection and should not be used as the sole basis for treatment or other patient management decisions. A negative result may occur with  improper specimen collection/handling, submission of specimen other than nasopharyngeal  swab, presence of viral mutation(s) within the areas targeted by this assay, and inadequate number of viral copies(<138 copies/mL). A negative result must be combined with clinical observations, patient history, and epidemiological information. The expected result is Negative.  Fact Sheet for Patients:  EntrepreneurPulse.com.au  Fact Sheet for Healthcare Providers:  IncredibleEmployment.be  This test is no t yet approved or cleared by the [place] FDA and  has been authorized for detection and/or diagnosis of SARS-CoV-2 by FDA under an Emergency Use Authorization (EUA). This EUA will remain  in effect (meaning this test can be used) for the duration of the COVID-19 declaration under Section 564(b)(1) of the Act, 21 U.S.C.section 360bbb-3(b)(1), unless the authorization is terminated  or revoked sooner.       Influenza A by PCR NEGATIVE NEGATIVE Final   Influenza  B by PCR NEGATIVE NEGATIVE Final    Comment: (NOTE) The Xpert Xpress SARS-CoV-2/FLU/RSV plus assay is intended as an aid in the diagnosis of influenza from Nasopharyngeal swab specimens and should not be used as a sole basis for treatment. Nasal washings and aspirates are unacceptable for Xpert Xpress SARS-CoV-2/FLU/RSV testing.  Fact Sheet for Patients: EntrepreneurPulse.com.au  Fact Sheet for Healthcare Providers: IncredibleEmployment.be  This test is not yet approved or cleared by the [place] FDA and has been authorized for detection and/or diagnosis of SARS-CoV-2 by FDA under an Emergency Use Authorization (EUA). This EUA will remain in effect (meaning this test can be used) for the duration of the COVID-19 declaration under Section 564(b)(1) of the Act, 21 U.S.C. section 360bbb-3(b)(1), unless the authorization is terminated or revoked.  Performed at [facility], [place] [address] [place], [place] [address]       Radiology Studies: DG Chest 2 View  Result Date: 10/19/2020 CLINICAL DATA:  Rib pain.  History of bone metastasis. EXAM: CHEST - 2 VIEW COMPARISON:  Chest x-ray 07/30/2020.  CT 07/13/2020, 03/02/2020. FINDINGS: PowerPort catheter noted with tip over the right atrium. Heart size normal. Stable left perihilar and left mid lung densities again noted, most consistent with scarring. Stable nodular opacities left upper lung. No acute infiltrate. Mild left base pleural thickening. No prominent pleural effusion. No pneumothorax. No acute bony abnormality identified. Degenerative change thoracic spine. IMPRESSION: 1. PowerPort catheter noted with tip over right atrium. 2. Stable left perihilar and left mid lung densities again noted, most consistent with scarring. Stable nodular opacities left upper lung. No acute infiltrate. Electronically Signed   By: Marcello Moores  Register   On: 10/19/2020 17:03   DG Lumbar Spine Complete  Result Date: 10/19/2020 CLINICAL DATA:  Abnormal lab draw severe left hip pain back pain EXAM: LUMBAR SPINE - COMPLETE 4+ VIEW COMPARISON:  CT 05/11/2020 FINDINGS: Lumbar alignment within normal limits. Vertebral body heights are maintained. Mild degenerative changes at multiple levels. Mild facet degenerative changes of the lower lumbar spine. Aortic atherosclerosis IMPRESSION: Mild degenerative change. Electronically Signed   By: Donavan Foil M.D.   On: 10/19/2020 17:01   CT HEAD WO CONTRAST  Result Date: 10/19/2020 CLINICAL DATA:  54 year old male with non-small cell lung cancer undergoing treatment. Dizziness. Fatigue and weakness. EXAM: CT HEAD WITHOUT CONTRAST TECHNIQUE: Contiguous axial images were obtained from the base of the skull through the vertex without intravenous contrast. COMPARISON:  Neck CT 07/13/2020. PET-CT 08/29/2019. Brain MRI 06/12/2017. FINDINGS: Brain: Abnormal malignant dural thickening along the left frontal convexity (series 2, image 20) associated with  overlying lytic skull metastasis. Similar dural thickening also possible along the left posterior vertex, and at the left anterior temporal lobe related to skull metastases there. Questionable mild cerebral edema in the left anterior temporal tip (series 2, image 8). No intracranial mass effect, midline shift. No ventriculomegaly. No acute intracranial hemorrhage identified. No cortically based acute infarct identified. Chronic partially empty sella. Vascular: No suspicious intracranial vascular hyperdensity. Skull: Multiple relatively large lytic skull metastases. Along the left anterior frontal bone there is a roughly 4.2 cm diameter lytic metastasis which appears partially necrotic (series 2, image 22) and is associated with both underlying dural thickening (image 20) and overlying scalp soft tissue mass. Smaller 3.3 cm lytic metastasis along the posterior vertex at the confluence of the lambdoid sutures just to the left of midline. Elongated or 2 adjacent lytic metastases at the left middle cranial fossa including the left greater  sphenoid wing and along the left lateral coronal suture (coronal image 28). Note that the sphenoid wing lesion also has eroded the superolateral wall of the left orbit (series 5, image 24) but does not bulge into the orbit at this time. Sinuses/Orbits: Previous left mastoidectomy. Visualized paranasal sinuses and mastoids are stable and well pneumatized. Other: Abnormal scalp soft tissue thickening and heterogeneity overlying the lytic skull metastases (most notably left superior forehead series 2, image 22). Orbits soft tissues appear to remain normal. IMPRESSION: 1. Multiple relatively large lytic skull metastases, including: - dominant left anterior frontal bone metastasis which appears partially necrotic and extends into both the overlying scalp and underlying dura, and - left middle cranial fossa skull met which has eroded a portion of the left orbital wall. 2. Several areas of  associated metastatic dural thickening suspected. Possible associated mild vasogenic edema at the left temporal lobe tip. 3. But no intracranial mass effect or midline shift. Electronically Signed   By: Genevie Ann M.D.   On: 10/19/2020 19:35   CT CHEST W CONTRAST  Result Date: 10/19/2020 CLINICAL DATA:  Metastatic disease.  Small cell lung cancer. EXAM: CT CHEST, ABDOMEN, AND PELVIS WITH CONTRAST TECHNIQUE: Multidetector CT imaging of the chest, abdomen and pelvis was performed following the standard protocol during bolus administration of intravenous contrast. CONTRAST:  130mL OMNIPAQUE IOHEXOL 300 MG/ML  SOLN COMPARISON:  CT dated 07/13/2020 FINDINGS: CT CHEST FINDINGS Cardiovascular: The heart size is unremarkable. There is a well-positioned right-sided Port-A-Cath. Minimal atherosclerotic changes are noted of the thoracic aorta without evidence for dissection or aneurysm. There is no large centrally located pulmonary embolism Mediastinum/Nodes: -- No mediastinal lymphadenopathy. --there is a persistently enlarged right hilar lymph node (axial series 2, image 30). This is relatively stable when compared to prior study. -- No axillary lymphadenopathy. -- No supraclavicular lymphadenopathy. -- Normal thyroid gland where visualized. -  Unremarkable esophagus. Lungs/Pleura: There are relatively stable post treatment changes of the left upper lobe. There is persistent soft tissue within the left hilum and surrounding the left mainstem bronchus. Overall the volume of soft tissue in the left hilum has slightly increased from prior study. There is bronchial wall thickening and mucus plugging involving the left lower lobe. There is a small left-sided pleural effusion, new since prior study. There is some debris within the left mainstem bronchus. There are unchanged pulmonary nodules in the left upper lobe. Musculoskeletal: New lytic lesions are noted involving the right scapula, right posterior fourth rib, the medial  right posterior fifth rib, the medial posterior right seventh rib. Again noted is a lytic lesion involving the anterior left fifth rib. Additional lytic lesions are suspected for example in the left glenoid (axial series 4, image 71). CT ABDOMEN PELVIS FINDINGS Hepatobiliary: The liver is normal. There is a very abnormal appearance of the gallbladder with apparent gallbladder wall thickening and intraluminal debris or tumefactive sludge. This is new since the patient's prior CT. There is suggestion of sludge or small stones in the distal common bile duct (axial series 2, image 75).There is mild dilatation of the common bile duct measuring up to approximately 7 mm. Pancreas: Normal contours without ductal dilatation. No peripancreatic fluid collection. Spleen: Unremarkable. Adrenals/Urinary Tract: --Adrenal glands: There is an unchanged left adrenal nodule measuring approximately 1.3 cm. --Right kidney/ureter: No hydronephrosis or radiopaque kidney stones. --Left kidney/ureter: No hydronephrosis or radiopaque kidney stones. --Urinary bladder: Unremarkable. Stomach/Bowel: --Stomach/Duodenum: No hiatal hernia or other gastric abnormality. Normal duodenal course and caliber. --Small bowel:  Unremarkable. --Colon: Unremarkable. --Appendix: Normal. Vascular/Lymphatic: Atherosclerotic calcification is present within the non-aneurysmal abdominal aorta, without hemodynamically significant stenosis. --No retroperitoneal lymphadenopathy. --No mesenteric lymphadenopathy. --No pelvic or inguinal lymphadenopathy. Reproductive: Unremarkable Other: No ascites or free air. The abdominal wall is normal. Musculoskeletal. There is a new lytic lesion in the inferior aspect of the L5 vertebral body. There are lytic lesions involving the bilateral iliac bones that are new from prior study (axial series 2, image 106). There is a new lytic lesion in the left acetabulum (axial series 2, image 116). IMPRESSION: 1. Findings consistent with  progressive metastatic disease as evidence by multiple new lytic osseous lesions involving the thorax, spine, and pelvis. 2. Slight interval increase in soft tissue at the left hilum. While this may represent post treatment changes, recurrent tumor is not excluded. 3. There is a trace left-sided pleural effusion. 4. Very abnormal appearance of the gallbladder with diffuse gallbladder wall thickening and hyperenhancement with intraluminal debris or sludge. Follow-up with ultrasound is recommended. 5. Mild dilatation of the common bile duct with probable filling defects within the distal common bile duct. Correlation with MRCP/ERCP is recommended. Electronically Signed   By: Constance Holster M.D.   On: 10/19/2020 19:22   CT ABDOMEN PELVIS W CONTRAST  Result Date: 10/19/2020 CLINICAL DATA:  Metastatic disease.  Small cell lung cancer. EXAM: CT CHEST, ABDOMEN, AND PELVIS WITH CONTRAST TECHNIQUE: Multidetector CT imaging of the chest, abdomen and pelvis was performed following the standard protocol during bolus administration of intravenous contrast. CONTRAST:  140mL OMNIPAQUE IOHEXOL 300 MG/ML  SOLN COMPARISON:  CT dated 07/13/2020 FINDINGS: CT CHEST FINDINGS Cardiovascular: The heart size is unremarkable. There is a well-positioned right-sided Port-A-Cath. Minimal atherosclerotic changes are noted of the thoracic aorta without evidence for dissection or aneurysm. There is no large centrally located pulmonary embolism Mediastinum/Nodes: -- No mediastinal lymphadenopathy. --there is a persistently enlarged right hilar lymph node (axial series 2, image 30). This is relatively stable when compared to prior study. -- No axillary lymphadenopathy. -- No supraclavicular lymphadenopathy. -- Normal thyroid gland where visualized. -  Unremarkable esophagus. Lungs/Pleura: There are relatively stable post treatment changes of the left upper lobe. There is persistent soft tissue within the left hilum and surrounding the left  mainstem bronchus. Overall the volume of soft tissue in the left hilum has slightly increased from prior study. There is bronchial wall thickening and mucus plugging involving the left lower lobe. There is a small left-sided pleural effusion, new since prior study. There is some debris within the left mainstem bronchus. There are unchanged pulmonary nodules in the left upper lobe. Musculoskeletal: New lytic lesions are noted involving the right scapula, right posterior fourth rib, the medial right posterior fifth rib, the medial posterior right seventh rib. Again noted is a lytic lesion involving the anterior left fifth rib. Additional lytic lesions are suspected for example in the left glenoid (axial series 4, image 71). CT ABDOMEN PELVIS FINDINGS Hepatobiliary: The liver is normal. There is a very abnormal appearance of the gallbladder with apparent gallbladder wall thickening and intraluminal debris or tumefactive sludge. This is new since the patient's prior CT. There is suggestion of sludge or small stones in the distal common bile duct (axial series 2, image 75).There is mild dilatation of the common bile duct measuring up to approximately 7 mm. Pancreas: Normal contours without ductal dilatation. No peripancreatic fluid collection. Spleen: Unremarkable. Adrenals/Urinary Tract: --Adrenal glands: There is an unchanged left adrenal nodule measuring approximately 1.3 cm. --Right kidney/ureter:  No hydronephrosis or radiopaque kidney stones. --Left kidney/ureter: No hydronephrosis or radiopaque kidney stones. --Urinary bladder: Unremarkable. Stomach/Bowel: --Stomach/Duodenum: No hiatal hernia or other gastric abnormality. Normal duodenal course and caliber. --Small bowel: Unremarkable. --Colon: Unremarkable. --Appendix: Normal. Vascular/Lymphatic: Atherosclerotic calcification is present within the non-aneurysmal abdominal aorta, without hemodynamically significant stenosis. --No retroperitoneal lymphadenopathy.  --No mesenteric lymphadenopathy. --No pelvic or inguinal lymphadenopathy. Reproductive: Unremarkable Other: No ascites or free air. The abdominal wall is normal. Musculoskeletal. There is a new lytic lesion in the inferior aspect of the L5 vertebral body. There are lytic lesions involving the bilateral iliac bones that are new from prior study (axial series 2, image 106). There is a new lytic lesion in the left acetabulum (axial series 2, image 116). IMPRESSION: 1. Findings consistent with progressive metastatic disease as evidence by multiple new lytic osseous lesions involving the thorax, spine, and pelvis. 2. Slight interval increase in soft tissue at the left hilum. While this may represent post treatment changes, recurrent tumor is not excluded. 3. There is a trace left-sided pleural effusion. 4. Very abnormal appearance of the gallbladder with diffuse gallbladder wall thickening and hyperenhancement with intraluminal debris or sludge. Follow-up with ultrasound is recommended. 5. Mild dilatation of the common bile duct with probable filling defects within the distal common bile duct. Correlation with MRCP/ERCP is recommended. Electronically Signed   By: Constance Holster M.D.   On: 10/19/2020 19:22   MR 3D Recon At Scanner  Result Date: 10/20/2020 CLINICAL DATA:  Cholelithiasis.  Enhancing gallbladder wall on CT. EXAM: MRI ABDOMEN WITHOUT AND WITH CONTRAST (INCLUDING MRCP) TECHNIQUE: Multiplanar multisequence MR imaging of the abdomen was performed both before and after the administration of intravenous contrast. Heavily T2-weighted images of the biliary and pancreatic ducts were obtained, and three-dimensional MRCP images were rendered by post processing. CONTRAST:  37mL GADAVIST GADOBUTROL 1 MMOL/ML IV SOLN COMPARISON:  CT 10/19/2020, 07/13/2020 PET-CT 08/29/2019 FINDINGS: Lower chest: Lung bases are clear. Hepatobiliary: No focal hepatic lesion. There is gallbladder wall thickening involving the fundus  of the gallbladder best seen on image 15/3 with thickening of single wall up to 10 mm. On postcontrast imaging there is no apparent enhancement of this of thickened gallbladder fundus. There is no intrahepatic biliary duct dilatation. The common bile duct normal caliber. No filling defect within the common bile duct. Pancreas: Pancreas is normal. No ductal dilatation. No pancreatic inflammation. Spleen: Normal spleen Adrenals/urinary tract: Adrenal glands normal. Nonenhancing cysts of the RIGHT kidney. Medial to the LEFT kidney there is an oblong lesion which is hypointense on T2 weighted imaging measuring 16 x 8 mm (image 20/15). There is mild enhancement of this lesion (image 47/series 24.) Stomach/Bowel: Stomach and limited view of the small bowel is unremarkable. Limited view of the colon is unremarkable Vascular/Lymphatic: Abdominal aorta normal caliber. No upper abdominal adenopathy. Other: No free fluid. Musculoskeletal: Enhancing lesion the posterior LEFT rib (image 20/24) IMPRESSION: 1. No evidence of choledocholithiasis.  No biliary obstruction. 2. Circumferential thickening of the gallbladder fundus without enhancement. Nonspecific finding. Favor chronic inflammation. Cannot exclude gallbladder metastasis however non enhancement would weigh against metastasis. 3. New retroperitoneal nodule medial to the LEFT kidney is concerning for lung cancer metastasis. Consider nonemergent outpatient FDG PET scan for further characterization (gallbladder could be assessed at same time). 4. 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 Electronically Signed   By: Suzy Bouchard M.D.   On: 10/20/2020 09:42   MR ABDOMEN MRCP W  WO CONTAST  Result Date: 10/20/2020 CLINICAL DATA:  Cholelithiasis.  Enhancing gallbladder wall on CT. EXAM: MRI ABDOMEN WITHOUT AND WITH CONTRAST (INCLUDING MRCP) TECHNIQUE: Multiplanar multisequence MR imaging of the abdomen was  performed both before and after the administration of intravenous contrast. Heavily T2-weighted images of the biliary and pancreatic ducts were obtained, and three-dimensional MRCP images were rendered by post processing. CONTRAST:  70m GADAVIST GADOBUTROL 1 MMOL/ML IV SOLN COMPARISON:  CT 10/19/2020, 07/13/2020 PET-CT 08/29/2019 FINDINGS: Lower chest: Lung bases are clear. Hepatobiliary: No focal hepatic lesion. There is gallbladder wall thickening involving the fundus of the gallbladder best seen on image 15/3 with thickening of single wall up to 10 mm. On postcontrast imaging there is no apparent enhancement of this of thickened gallbladder fundus. There is no intrahepatic biliary duct dilatation. The common bile duct normal caliber. No filling defect within the common bile duct. Pancreas: Pancreas is normal. No ductal dilatation. No pancreatic inflammation. Spleen: Normal spleen Adrenals/urinary tract: Adrenal glands normal. Nonenhancing cysts of the RIGHT kidney. Medial to the LEFT kidney there is an oblong lesion which is hypointense on T2 weighted imaging measuring 16 x 8 mm (image 20/15). There is mild enhancement of this lesion (image 47/series 24.) Stomach/Bowel: Stomach and limited view of the small bowel is unremarkable. Limited view of the colon is unremarkable Vascular/Lymphatic: Abdominal aorta normal caliber. No upper abdominal adenopathy. Other: No free fluid. Musculoskeletal: Enhancing lesion the posterior LEFT rib (image 20/24) IMPRESSION: 1. No evidence of choledocholithiasis.  No biliary obstruction. 2. Circumferential thickening of the gallbladder fundus without enhancement. Nonspecific finding. Favor chronic inflammation. Cannot exclude gallbladder metastasis however non enhancement would weigh against metastasis. 3. New retroperitoneal nodule medial to the LEFT kidney is concerning for lung cancer metastasis. Consider nonemergent outpatient FDG PET scan for further characterization  (gallbladder could be assessed at same time). 4. 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 Electronically Signed   By: SSuzy BouchardM.D.   On: 10/20/2020 09:42   DG Hip Unilat W or Wo Pelvis 2-3 Views Left  Result Date: 10/19/2020 CLINICAL DATA:  Severe hip pain EXAM: DG HIP (WITH OR WITHOUT PELVIS) 2-3V LEFT COMPARISON:  CT 05/11/2020, pelvis radiograph 10/18/2020 FINDINGS: SI joints are patent. Pubic symphysis and rami appear intact. No fracture or malalignment. The joint space appears maintained IMPRESSION: Negative. Electronically Signed   By: KDonavan FoilM.D.   On: 10/19/2020 17:02   [place]dom[place] Limited RUQ (LIVER/GB)  Result Date: 10/20/2020 CLINICAL DATA:  Elevated LFTs EXAM: ULTRASOUND ABDOMEN LIMITED RIGHT UPPER QUADRANT COMPARISON:  CT 10/19/2020 FINDINGS: Gallbladder: Contracted gallbladder with wall thickening. Similar to CT same day. Common bile duct: Diameter: Normal diameter at 5 mm Liver: Normal liver parenchyma. No duct dilatation. Portal vein is patent on color Doppler imaging with normal direction of blood flow towards the liver. Other: No free fluid IMPRESSION: 1. Contracted gallbladder with thickened wall. Findings similar to comparison CT same day. 2. Negative sonographic Murphy's sign. 3. No biliary duct dilatation. Electronically Signed   By: SSuzy BouchardM.D.   On: 10/20/2020 06:46    CMarzetta Board MD, PhD [facility]  Between 7 am - 7 pm I am available, please contact me via Amion or Securechat  Between 7 pm - 7 am I am not available, please contact night coverage MD/APP via Amion

## 2020-10-20 NOTE — Progress Notes (Signed)
Report received from ED 

## 2020-10-20 NOTE — Progress Notes (Signed)
PT Cancellation Note  Patient Details Name: CORWIN KUIKEN MRN: [id number] DOB: [birth year]   Cancelled Treatment:    Reason Eval/Treat Not Completed: Patient declined, no reason specified. Pt just got to the unit from the ED. Reports he has already had a MRI and [place] this AM and would like to rest. He requested PT to return later in the day.  Will check back as schedule permits.   Galen Manila 10/20/2020, 9:35 AM

## 2020-10-20 NOTE — ED Notes (Signed)
US bedside

## 2020-10-20 NOTE — ED Notes (Addendum)
Pt transported to MRI, will be transported to floor after

## 2020-10-21 DIAGNOSIS — M5440 Lumbago with sciatica, unspecified side: Secondary | ICD-10-CM | POA: Diagnosis not present

## 2020-10-21 DIAGNOSIS — R7989 Other specified abnormal findings of blood chemistry: Secondary | ICD-10-CM | POA: Diagnosis not present

## 2020-10-21 LAB — COMPREHENSIVE METABOLIC PANEL
ALT: 76 U/L — ABNORMAL HIGH (ref 0–44)
AST: 27 U/L (ref 15–41)
Albumin: 2.6 g/dL — ABNORMAL LOW (ref 3.5–5.0)
Alkaline Phosphatase: 278 U/L — ABNORMAL HIGH (ref 38–126)
Anion gap: 9 (ref 5–15)
BUN: 6 mg/dL (ref 6–20)
CO2: 30 mmol/L (ref 22–32)
Calcium: 9.8 mg/dL (ref 8.9–10.3)
Chloride: 99 mmol/L (ref 98–111)
Creatinine, Ser: 0.61 mg/dL (ref 0.61–1.24)
GFR, Estimated: >60 mL/min (ref >60)
Glucose, Bld: 80 mg/dL (ref 70–99)
Potassium: 3 mmol/L — ABNORMAL LOW (ref 3.5–5.1)
Sodium: 138 mmol/L (ref 135–145)
Total Bilirubin: 0.2 mg/dL — ABNORMAL LOW (ref 0.3–1.2)
Total Protein: 5.3 g/dL — ABNORMAL LOW (ref 6.5–8.1)

## 2020-10-21 LAB — HIV ANTIBODY (ROUTINE TESTING W REFLEX): HIV Screen 4th Generation wRfx: REACTIVE — AB

## 2020-10-21 MED ORDER — POTASSIUM CHLORIDE CRYS ER 20 MEQ PO TBCR
40.0000 meq | EXTENDED_RELEASE_TABLET | ORAL | Status: AC
  Administered 2020-10-21 (×2): 40 meq via ORAL
  Filled 2020-10-21 (×2): qty 2

## 2020-10-21 MED ORDER — OXYCODONE HCL 10 MG PO TABS: 10.0000 mg | ORAL_TABLET | ORAL | 0 refills | Status: DC | PRN

## 2020-10-21 MED ORDER — HEPARIN SOD (PORK) LOCK FLUSH 100 UNIT/ML IV SOLN
500.0000 [IU] | INTRAVENOUS | Status: AC | PRN
  Administered 2020-10-21: 11:00:00 500 [IU]
  Filled 2020-10-21: qty 5

## 2020-10-21 NOTE — Discharge Instructions (Signed)
Acute Back Pain, Adult Acute back pain is sudden and usually short-lived. It is often caused by an injury to the muscles and tissues in the back. The injury may result from:  A muscle or ligament getting overstretched or torn (strained). Ligaments are tissues that connect bones to each other. Lifting something improperly can cause a back strain.  Wear and tear (degeneration) of the spinal disks. Spinal disks are circular tissue that provides cushioning between the bones of the spine (vertebrae).  Twisting motions, such as while playing sports or doing yard work.  A hit to the back.  Arthritis. You may have a physical exam, lab tests, and imaging tests to find the cause of your pain. Acute back pain usually goes away with rest and home care. Follow these instructions at home: Managing pain, stiffness, and swelling  Take over-the-counter and prescription medicines only as told by your health care provider.  Your health care provider may recommend applying ice during the first 24-48 hours after your pain starts. To do this: ? Put ice in a plastic bag. ? Place a towel between your skin and the bag. ? Leave the ice on for 20 minutes, 2-3 times a day.  If directed, apply heat to the affected area as often as told by your health care provider. Use the heat source that your health care provider recommends, such as a moist heat pack or a heating pad. ? Place a towel between your skin and the heat source. ? Leave the heat on for 20-30 minutes. ? Remove the heat if your skin turns bright red. This is especially important if you are unable to feel pain, heat, or cold. You have a greater risk of getting burned. Activity   Do not stay in bed. Staying in bed for more than 1-2 days can delay your recovery.  Sit up and stand up straight. Avoid leaning forward when you sit, or hunching over when you stand. ? If you work at a desk, sit close to it so you do not need to lean over. Keep your chin  tucked in. Keep your neck drawn back, and keep your elbows bent at a right angle. Your arms should look like the letter "L." ? Sit high and close to the steering wheel when you drive. Add lower back (lumbar) support to your car seat, if needed.  Take short walks on even surfaces as soon as you are able. Try to increase the length of time you walk each day.  Do not sit, drive, or stand in one place for more than 30 minutes at a time. Sitting or standing for long periods of time can put stress on your back.  Do not drive or use heavy machinery while taking prescription pain medicine.  Use proper lifting techniques. When you bend and lift, use positions that put less stress on your back: ? Parole your knees. ? Keep the load close to your body. ? Avoid twisting.  Exercise regularly as told by your health care provider. Exercising helps your back heal faster and helps prevent back injuries by keeping muscles strong and flexible.  Work with a physical therapist to make a safe exercise program, as recommended by your health care provider. Do any exercises as told by your physical therapist. Lifestyle  Maintain a healthy weight. Extra weight puts stress on your back and makes it difficult to have good posture.  Avoid activities or situations that make you feel anxious or stressed. Stress and anxiety increase  muscle tension and can make back pain worse. Learn ways to manage anxiety and stress, such as through exercise. General instructions  Sleep on a firm mattress in a comfortable position. Try lying on your side with your knees slightly bent. If you lie on your back, put a pillow under your knees.  Follow your treatment plan as told by your health care provider. This may include: ? Cognitive or behavioral therapy. ? Acupuncture or massage therapy. ? Meditation or yoga. Contact a health care provider if:  You have pain that is not relieved with rest or medicine.  You have increasing pain  going down into your legs or buttocks.  Your pain does not improve after 2 weeks.  You have pain at night.  You lose weight without trying.  You have a fever or chills. Get help right away if:  You develop new bowel or bladder control problems.  You have unusual weakness or numbness in your arms or legs.  You develop nausea or vomiting.  You develop abdominal pain.  You feel faint. Summary  Acute back pain is sudden and usually short-lived.  Use proper lifting techniques. When you bend and lift, use positions that put less stress on your back.  Take over-the-counter and prescription medicines and apply heat or ice as directed by your health care provider. This information is not intended to replace advice given to you by your health care provider. Make sure you discuss any questions you have with your health care provider. Document Revised: 02/15/2019 Document Reviewed: 06/10/2017 Elsevier Patient Education  [place].   Chronic Back Pain When back pain lasts longer than 3 months, it is called chronic back pain.The cause of your back pain may not be known. Some common causes include:  Wear and tear (degenerative disease) of the bones, ligaments, or disks in your back.  Inflammation and stiffness in your back (arthritis). People who have chronic back pain often go through certain periods in which the pain is more intense (flare-ups). Many people can learn to manage the pain with home care. Follow these instructions at home: Pay attention to any changes in your symptoms. Take these actions to help with your pain: Activity   Avoid bending and other activities that make the problem worse.  Maintain a proper position when standing or sitting: ? When standing, keep your upper back and neck straight, with your shoulders pulled back. Avoid slouching. ? When sitting, keep your back straight and relax your shoulders. Do not round your shoulders or pull them  backward.  Do not sit or stand in one place for long periods of time.  Take brief periods of rest throughout the day. This will reduce your pain. Resting in a lying or standing position is usually better than sitting to rest.  When you are resting for longer periods, mix in some mild activity or stretching between periods of rest. This will help to prevent stiffness and pain.  Get regular exercise. Ask your health care provider what activities are safe for you.  Do not lift anything that is heavier than 10 lb (4.5 kg). Always use proper lifting technique, which includes: ? Bending your knees. ? Keeping the load close to your body. ? Avoiding twisting.  Sleep on a firm mattress in a comfortable position. Try lying on your side with your knees slightly bent. If you lie on your back, put a pillow under your knees. Managing pain  If directed, apply ice to the painful area. Your  health care provider may recommend applying ice during the first 24-48 hours after a flare-up begins. ? Put ice in a plastic bag. ? Place a towel between your skin and the bag. ? Leave the ice on for 20 minutes, 2-3 times per day.  If directed, apply heat to the affected area as often as told by your health care provider. Use the heat source that your health care provider recommends, such as a moist heat pack or a heating pad. ? Place a towel between your skin and the heat source. ? Leave the heat on for 20-30 minutes. ? Remove the heat if your skin turns bright red. This is especially important if you are unable to feel pain, heat, or cold. You may have a greater risk of getting burned.  Try soaking in a warm tub.  Take over-the-counter and prescription medicines only as told by your health care provider.  Keep all follow-up visits as told by your health care provider. This is important. Contact a health care provider if:  You have pain that is not relieved with rest or medicine. Get help right away if:  You  have weakness or numbness in one or both of your legs or feet.  You have trouble controlling your bladder or your bowels.  You have nausea or vomiting.  You have pain in your abdomen.  You have shortness of breath or you faint. This information is not intended to replace advice given to you by your health care provider. Make sure you discuss any questions you have with your health care provider. Document Revised: 02/17/2019 Document Reviewed: 05/06/2017 Elsevier Patient Education  [place].   Hypercalcemia Hypercalcemia is when the level of calcium in a person's blood is above normal. The body needs calcium to make bones and keep them strong. Calcium also helps the muscles, nerves, brain, and heart work the way they should. Most of the calcium in the body is in the bones. There is also some calcium in the blood. Hypercalcemia can happen when calcium comes out of the bones, or when the kidneys are not able to remove calcium from the blood. Hypercalcemia can be mild or severe. What are the causes? There are many possible causes of hypercalcemia. Common causes of this condition include:  Hyperparathyroidism. This is a condition in which the body produces too much parathyroid hormone. There are four parathyroid glands in your neck. These glands produce a chemical messenger (hormone) that helps the body absorb calcium from foods and helps your bones release calcium.  Certain kinds of cancer. Less common causes of hypercalcemia include:  Getting too much calcium or vitamin D from your diet.  Kidney failure.  Hyperthyroidism.  Severe dehydration.  Being on bed rest or being inactive for a long time.  Certain medicines.  Infections. What increases the risk? You are more likely to develop this condition if you:  Are male.  Are 22 years of age or older.  Have a family history of hypercalcemia. What are the signs or symptoms? Mild hypercalcemia that starts slowly may  not cause symptoms. Severe, sudden hypercalcemia is more likely to cause symptoms, such as:  Being more thirsty than usual.  Needing to urinate more often than usual.  Abdominal pain.  Nausea and vomiting.  Constipation.  Muscle pain, twitching, or weakness.  Feeling very tired. How is this diagnosed?  Hypercalcemia is usually diagnosed with a blood test. You may also have tests to help determine what is causing this condition,  such as imaging tests and more blood tests. How is this treated? Treatment for hypercalcemia depends on the cause. Treatment may include:  Receiving fluids through an IV.  Medicines that: ? Keep calcium levels steady after receiving fluids (loop diuretics). ? Keep calcium in your bones (bisphosphonates). ? Lower the calcium level in your blood.  Surgery to remove overactive parathyroid glands.  A procedure that filters your blood to correct calcium levels (hemodialysis). Follow these instructions at home:   Take over-the-counter and prescription medicines only as told by your health care provider.  Follow instructions from your health care provider about eating or drinking restrictions.  Drink enough fluid to keep your urine pale yellow.  Stay active. Weight-bearing exercise helps to keep calcium in your bones. Follow instructions from your health care provider about what type and level of exercise is safe for you.  Keep all follow-up visits as told by your health care provider. This is important. Contact a health care provider if you have:  A fever.  A heartbeat that is irregular or very fast.  Changes in mood, memory, or personality. Get help right away if you:  Have severe abdominal pain.  Have chest pain.  Have trouble breathing.  Become very confused and sleepy.  Lose consciousness. Summary  Hypercalcemia is when the level of calcium in a person's blood is above normal. The body needs calcium to make bones and keep them  strong. Calcium also helps the muscles, nerves, brain, and heart work the way they should.  There are many possible causes of hypercalcemia, and treatment depends on the cause.  Take over-the-counter and prescription medicines only as told by your health care provider.  Follow instructions from your health care provider about eating or drinking restrictions. This information is not intended to replace advice given to you by your health care provider. Make sure you discuss any questions you have with your health care provider. Document Revised: 11/23/2018 Document Reviewed: 08/02/2018 Elsevier Patient Education  2020 [place].   What You Need to Know About Chronic Back Pain Long-term (chronic) back pain is back pain that lasts for 12 weeks or longer. It often affects the lower back and can range from mild to severe. Many people have back pain at some point in their lives. It can feel different to each person. It may feel like a muscle ache or a sharp, stabbing pain. The pain often gets worse over time. It can be difficult to find the cause of chronic back pain. Treating chronic back pain often starts with rest and pain relief, followed by exercises (physical therapy) to strengthen the muscles that support your back. You may have to try different things to see what works best for you. If other treatments do not help, or if your pain is caused by a condition or an injury, you may need surgery. How can back pain affect me? Chronic back pain is uncomfortable and can make it hard to do your usual daily activities. Chronic back pain can:  Cause numbness and tingling.  Come and go.  Get worse when you are sitting, standing, walking, bending, or lifting.  Affect you while you are active, at rest, or both.  Eventually make it hard to move around.  Occur with fever, weight loss, or difficulty urinating. What are the benefits of treating back pain? Treating chronic back pain may:  Relieve  pain.  Keep your pain from getting worse.  Make it easier for you to do your usual activities.  What are some steps I can take to decrease my back pain?   Take over-the-counter or prescription medicines only as told by your health care provider.  If directed, apply heat to the affected area. Use the heat source that your health care provider recommends, such as a moist heat pack or a heating pad. ? Place a towel between your skin and the heat source. ? Leave the heat on for 20-30 minutes. ? Remove the heat if your skin turns bright red. This is especially important if you are unable to feel pain, heat, or cold. You may have a greater risk of getting burned.  If directed, put ice on the affected area: ? Put ice in a plastic bag. ? Place a towel between your skin and the bag. ? Leave the ice on for 20 minutes, 2-3 times a day.  Get regular exercise as told by your health care provider to improve flexibility and strength.  Do not smoke.  Maintain a healthy weight.  When lifting objects: ? Keep your feet as far apart as your shoulders (shoulder-width apart) or farther apart. ? Tighten the muscles in your abdomen. ? Bend your knees and hips and keep your spine neutral. It is important to lift using the strength of your legs, not your back. Do not lock your knees straight out. ? Always ask for help to lift heavy or awkward objects. What can happen if my back pain goes untreated? Untreated back pain can:  Get worse over time.  Start to occur more often or at different times, such as when you are resting.  Cause posture problems.  Make it hard to move around (limit mobility). Where can I get support? Chronic back pain can be a frustrating condition to manage. It may help to talk with other people who are having a similar experience. Consider joining a support group for people dealing with chronic back pain. Ask your health care provider about support groups in your area. You can also  find online and in-person support groups through:  The American Chronic Pain Association: DeluxeOption.si  The U.S. Pain Foundation: uspainfoundation.org/support-groups Contact a health care provider if:  Your symptoms do not get better or they get worse.  You have severe back pain.  You have chronic back pain and a fever.  You lose weight without trying.  You have difficulty urinating.  You experience numbness or tingling.  You develop new pain after an injury. Summary  Chronic back pain is often treated with rest, pain relief, and physical therapy.  Get regular exercise to improve your strength and flexibility.  Put heat and ice on the affected areas as directed by your health care provider.  Chronic back pain can be challenging to live with. Joining a support group may help you manage your condition. This information is not intended to replace advice given to you by your health care provider. Make sure you discuss any questions you have with your health care provider. Document Revised: 10/09/2017 Document Reviewed: 07/05/2016 Elsevier Patient Education  2020 [place]. Follow with [name], [name], MD in 2-3 weeks Follow with Dr Julien Nordmann in 1-2 weeks  Please get a complete blood count and chemistry panel checked by your Primary MD at your next visit, and again as instructed by your Primary MD. Please get your medications reviewed and adjusted by your Primary MD.  Please request your Primary MD to go over all Hospital Tests and Procedure/Radiological results at the follow up, please get all [facility]  records sent to your Prim MD by signing hospital release before you go home.  In some cases, there will be blood work, cultures and biopsy results pending at the time of your discharge. Please request that your primary care M.D. goes through all the records of your hospital data and follows up on these results.  If you had Pneumonia of Lung problems at  the Hospital: Please get a 2 view Chest X ray done in 6-8 weeks after hospital discharge or sooner if instructed by your Primary MD.  If you have Congestive Heart Failure: Please call your Cardiologist or Primary MD anytime you have any of the following symptoms:  1) 3 pound weight gain in 24 hours or 5 pounds in 1 week  2) shortness of breath, with or without a dry hacking cough  3) swelling in the hands, feet or stomach  4) if you have to sleep on extra pillows at night in order to breathe  Follow cardiac low salt diet and 1.5 lit/day fluid restriction.  If you have diabetes Accuchecks 4 times/day, Once in AM empty stomach and then before each meal. Log in all results and show them to your primary doctor at your next visit. If any glucose reading is under 80 or above 300 call your primary MD immediately.  If you have Seizure/Convulsions/Epilepsy: Please do not drive, operate heavy machinery, participate in activities at heights or participate in high speed sports until you have seen by Primary MD or a Neurologist and advised to do so again. Per [facility] statutes, patients with seizures are not allowed to drive until they have been seizure-free for six months.  Use caution when using heavy equipment or power tools. Avoid working on ladders or at heights. Take showers instead of baths. Ensure the water temperature is not too high on the home water heater. Do not go swimming alone. Do not lock yourself in a room alone (i.e. bathroom). When caring for infants or small children, sit down when holding, feeding, or changing them to minimize risk of injury to the child in the event you have a seizure. Maintain good sleep hygiene. Avoid alcohol.   If you had Gastrointestinal Bleeding: Please ask your Primary MD to check a complete blood count within one week of discharge or at your next visit. Your endoscopic/colonoscopic biopsies that are pending at the time of discharge, will also need to  followed by your Primary MD.  Get Medicines reviewed and adjusted. Please take all your medications with you for your next visit with your Primary MD  Please request your Primary MD to go over all hospital tests and procedure/radiological results at the follow up, please ask your Primary MD to get all Hospital records sent to his/her office.  If you experience worsening of your admission symptoms, develop shortness of breath, life threatening emergency, suicidal or homicidal thoughts you must seek medical attention immediately by calling 911 or calling your MD immediately  if symptoms less severe.  You must read complete instructions/literature along with all the possible adverse reactions/side effects for all the Medicines you take and that have been prescribed to you. Take any new Medicines after you have completely understood and accpet all the possible adverse reactions/side effects.   Do not drive or operate heavy machinery when taking Pain medications.   Do not take more than prescribed Pain, Sleep and Anxiety Medications  Special Instructions: If you have smoked or chewed Tobacco  in the last 2 yrs please stop  smoking, stop any regular Alcohol  and or any Recreational drug use.  Wear Seat belts while driving.  Please note You were cared for by a hospitalist during your hospital stay. If you have any questions about your discharge medications or the care you received while you were in the hospital after you are discharged, you can call the unit and asked to speak with the hospitalist on call if the hospitalist that took care of you is not available. Once you are discharged, your primary care physician will handle any further medical issues. Please note that NO REFILLS for any discharge medications will be authorized once you are discharged, as it is imperative that you return to your primary care physician (or establish a relationship with a primary care physician if you do not have one)  for your aftercare needs so that they can reassess your need for medications and monitor your lab values.  You can reach the hospitalist office at phone [phone] or fax [phone]   If you do not have a primary care physician, you can call [phone] for a physician referral.  Activity: As tolerated with Full fall precautions use walker/cane & assistance as needed    Diet: regular  Disposition Home

## 2020-10-21 NOTE — Progress Notes (Signed)
Discharge instructions explained to patient and his wife. Prescription given to his wife for oxycodone. They deny having any questions. Trevor Zavala using a front wheel walker to ambulate and he took it home. PT told him it was his. Discharged via wheelchair.

## 2020-10-21 NOTE — Discharge Summary (Signed)
Physician Discharge Summary  Trevor Zavala YKD:[id number] DOB: 21-Apr-1966 DOA: 10/19/2020  PCP: Cassandria Anger, MD  Admit date: 10/19/2020 Discharge date: 10/21/2020  Admitted From: home Disposition:  home  Recommendations for Outpatient Follow-up:  1. Follow up with PCP in 1-2 weeks 2. Follow up with Dr Julien Nordmann in 1-2 weeks 3. Please recheck a CMP in 1 week  Home Health: none Equipment/Devices: none  Discharge Condition: stable CODE STATUS: Full code Diet recommendation: regular  HPI: Per admitting MD, Trevor Zavala is a 54 y.o. male with medical history significant of prior tobacco use, COPD, hypertension, metastatic lung cancer comes to the hospital sent from the cancer center due to hypercalcemia on labs today.  He is seen by Dr. Earlie Server as an outpatient.  Initially he was treated by Dr. Earlie Server and received chemoradiation in 2018 as well as immunotherapy, completed total of 35 cycles.  Last seen by Dr. Earlie Server in September and has been maintained on conservative observation.  He was recently found to have metastatic lesion to his left ribs and completed 5 fractions of radiation therapy November 2021.  Of note, he also has a history of squamous cell carcinoma of epiglottis. Today patient tells me that he has been feeling significantly weak over the last several days to 1 week, having a harder time to walk due to hip pain and using a cane.  He has been complaining of constipation.  He denies any chest pain, denies any shortness of breath.  No abdominal pain, no nausea or vomiting.  He denies any confusion.  Complains of occasional lightheadedness.  Hospital Course / Discharge diagnoses: Principal Problem Hypercalcemia of malignancy, stage IV lung cancer -Patient with significant elevation of his calcium, 14.9 on admission (corrected).  He received zoledronic acid and aggressive fluid hydration followed by lasix.  His hypercalcemia improved, 12.6 on hospital day 2 and 10.9  (corrected values) on hospital day 3.  His weakness is improved, and with improvement in his calcium will be discharged home in stable condition with outpatient follow-up.  Active Problems Stage IV lung cancer-CT of the head, chest, abdomen and pelvis showed progression of his malignancy with multiple bone metastasis.  Outpatient follow-up with Dr. Earlie Server Hypokalemia -due to Lasix, repleted prior to discharge  Elevated LFTs -Initial CT scan showed a very abnormal appearance of the gallbladder with wall thickening and concern for CBD filling defect.  He is asymptomatic.  He underwent an ultrasound and an MRI/MRCP without evidence of choledocholithiasis, no biliary obstruction, and gallbladder findings are nonspecific favoring chronic inflammation.  Given lack of symptoms this can be further followed up as an outpatient.  It is possible that his LFT elevation is due to his acetaminophen use in his Percocet as patient was taking 2 pills 5 times daily which amounts to a little above 3 g of acetaminophen.  Will switch to oxycodone on discharge, given short course until he sees his primary Cancer related pain-oxycodone as below Anemia -In the setting of malignancy, no bleeding, monitor hemoglobin as an outpatient  Sepsis ruled out  Discharge Instructions   Allergies as of 10/21/2020      Reactions   Ativan [lorazepam] Other (See Comments)   'Makes me crazy'   Escitalopram Oxalate    "Bad thoughts"      Medication List    STOP taking these medications   oxyCODONE-acetaminophen 5-325 MG tablet Commonly known as: PERCOCET/ROXICET     TAKE these medications   acyclovir 400 MG tablet Commonly known  as: ZOVIRAX Take 1 tablet (400 mg total) by mouth 3 (three) times daily.   Breo Ellipta 100-25 MCG/INH Aepb Generic drug: fluticasone furoate-vilanterol Inhale 1 puff into the lungs daily. Annual appt is overdue must see provider for future refills What changed: additional instructions    clonazePAM 0.5 MG tablet Commonly known as: KLONOPIN TAKE 1 OR 2 TABLETS BY MOUTH TWICE A DAY AS NEEDED FOR ANXIETY What changed: See the new instructions.   docusate sodium 100 MG capsule Commonly known as: COLACE Take 200 mg by mouth daily.   esomeprazole 40 MG capsule Commonly known as: NEXIUM TAKE 1 CAPSULE BY MOUTH EVERY DAY IN THE MORNING What changed: See the new instructions.   folic acid 1 MG tablet Commonly known as: FOLVITE Take 1 mg by mouth daily.   multivitamin with minerals Tabs tablet Take 1 tablet by mouth daily.   Oxycodone HCl 10 MG Tabs Take 1 tablet (10 mg total) by mouth every 4 (four) hours as needed for severe pain.       Follow-up Information    Curt Bears, MD. Schedule an appointment as soon as possible for a visit in 1 week(s).   Specialty: Oncology Contact information: [place] [address] [phone]               Consultations:  None   Procedures/Studies:  DG Chest 2 View  Result Date: 10/19/2020 CLINICAL DATA:  Rib pain.  History of bone metastasis. EXAM: CHEST - 2 VIEW COMPARISON:  Chest x-ray 07/30/2020.  CT 07/13/2020, 03/02/2020. FINDINGS: PowerPort catheter noted with tip over the right atrium. Heart size normal. Stable left perihilar and left mid lung densities again noted, most consistent with scarring. Stable nodular opacities left upper lung. No acute infiltrate. Mild left base pleural thickening. No prominent pleural effusion. No pneumothorax. No acute bony abnormality identified. Degenerative change thoracic spine. IMPRESSION: 1. PowerPort catheter noted with tip over right atrium. 2. Stable left perihilar and left mid lung densities again noted, most consistent with scarring. Stable nodular opacities left upper lung. No acute infiltrate. Electronically Signed   By: Marcello Moores  Register   On: 10/19/2020 17:03   DG Lumbar Spine Complete  Result Date: 10/19/2020 CLINICAL DATA:  Abnormal lab  draw severe left hip pain back pain EXAM: LUMBAR SPINE - COMPLETE 4+ VIEW COMPARISON:  CT 05/11/2020 FINDINGS: Lumbar alignment within normal limits. Vertebral body heights are maintained. Mild degenerative changes at multiple levels. Mild facet degenerative changes of the lower lumbar spine. Aortic atherosclerosis IMPRESSION: Mild degenerative change. Electronically Signed   By: Donavan Foil M.D.   On: 10/19/2020 17:01   CT HEAD WO CONTRAST  Result Date: 10/19/2020 CLINICAL DATA:  54 year old male with non-small cell lung cancer undergoing treatment. Dizziness. Fatigue and weakness. EXAM: CT HEAD WITHOUT CONTRAST TECHNIQUE: Contiguous axial images were obtained from the base of the skull through the vertex without intravenous contrast. COMPARISON:  Neck CT 07/13/2020. PET-CT 08/29/2019. Brain MRI 06/12/2017. FINDINGS: Brain: Abnormal malignant dural thickening along the left frontal convexity (series 2, image 20) associated with overlying lytic skull metastasis. Similar dural thickening also possible along the left posterior vertex, and at the left anterior temporal lobe related to skull metastases there. Questionable mild cerebral edema in the left anterior temporal tip (series 2, image 8). No intracranial mass effect, midline shift. No ventriculomegaly. No acute intracranial hemorrhage identified. No cortically based acute infarct identified. Chronic partially empty sella. Vascular: No suspicious intracranial vascular hyperdensity. Skull: Multiple relatively large  lytic skull metastases. Along the left anterior frontal bone there is a roughly 4.2 cm diameter lytic metastasis which appears partially necrotic (series 2, image 22) and is associated with both underlying dural thickening (image 20) and overlying scalp soft tissue mass. Smaller 3.3 cm lytic metastasis along the posterior vertex at the confluence of the lambdoid sutures just to the left of midline. Elongated or 2 adjacent lytic metastases at the  left middle cranial fossa including the left greater sphenoid wing and along the left lateral coronal suture (coronal image 28). Note that the sphenoid wing lesion also has eroded the superolateral wall of the left orbit (series 5, image 24) but does not bulge into the orbit at this time. Sinuses/Orbits: Previous left mastoidectomy. Visualized paranasal sinuses and mastoids are stable and well pneumatized. Other: Abnormal scalp soft tissue thickening and heterogeneity overlying the lytic skull metastases (most notably left superior forehead series 2, image 22). Orbits soft tissues appear to remain normal. IMPRESSION: 1. Multiple relatively large lytic skull metastases, including: - dominant left anterior frontal bone metastasis which appears partially necrotic and extends into both the overlying scalp and underlying dura, and - left middle cranial fossa skull met which has eroded a portion of the left orbital wall. 2. Several areas of associated metastatic dural thickening suspected. Possible associated mild vasogenic edema at the left temporal lobe tip. 3. But no intracranial mass effect or midline shift. Electronically Signed   By: Genevie Ann M.D.   On: 10/19/2020 19:35   CT CHEST W CONTRAST  Addendum Date: 10/20/2020   ADDENDUM REPORT: 10/20/2020 21:10 ADDENDUM: There is a new soft tissue nodule adjacent to the left kidney measuring approximately 2.4 cm. This is consistent with a new metastatic lesion. Electronically Signed   By: Constance Holster M.D.   On: 10/20/2020 21:10   Result Date: 10/20/2020 CLINICAL DATA:  Metastatic disease.  Small cell lung cancer. EXAM: CT CHEST, ABDOMEN, AND PELVIS WITH CONTRAST TECHNIQUE: Multidetector CT imaging of the chest, abdomen and pelvis was performed following the standard protocol during bolus administration of intravenous contrast. CONTRAST:  153m OMNIPAQUE IOHEXOL 300 MG/ML  SOLN COMPARISON:  CT dated 07/13/2020 FINDINGS: CT CHEST FINDINGS Cardiovascular: The  heart size is unremarkable. There is a well-positioned right-sided Port-A-Cath. Minimal atherosclerotic changes are noted of the thoracic aorta without evidence for dissection or aneurysm. There is no large centrally located pulmonary embolism Mediastinum/Nodes: -- No mediastinal lymphadenopathy. --there is a persistently enlarged right hilar lymph node (axial series 2, image 30). This is relatively stable when compared to prior study. -- No axillary lymphadenopathy. -- No supraclavicular lymphadenopathy. -- Normal thyroid gland where visualized. -  Unremarkable esophagus. Lungs/Pleura: There are relatively stable post treatment changes of the left upper lobe. There is persistent soft tissue within the left hilum and surrounding the left mainstem bronchus. Overall the volume of soft tissue in the left hilum has slightly increased from prior study. There is bronchial wall thickening and mucus plugging involving the left lower lobe. There is a small left-sided pleural effusion, new since prior study. There is some debris within the left mainstem bronchus. There are unchanged pulmonary nodules in the left upper lobe. Musculoskeletal: New lytic lesions are noted involving the right scapula, right posterior fourth rib, the medial right posterior fifth rib, the medial posterior right seventh rib. Again noted is a lytic lesion involving the anterior left fifth rib. Additional lytic lesions are suspected for example in the left glenoid (axial series 4, image 71). CT ABDOMEN  PELVIS FINDINGS Hepatobiliary: The liver is normal. There is a very abnormal appearance of the gallbladder with apparent gallbladder wall thickening and intraluminal debris or tumefactive sludge. This is new since the patient's prior CT. There is suggestion of sludge or small stones in the distal common bile duct (axial series 2, image 75).There is mild dilatation of the common bile duct measuring up to approximately 7 mm. Pancreas: Normal contours  without ductal dilatation. No peripancreatic fluid collection. Spleen: Unremarkable. Adrenals/Urinary Tract: --Adrenal glands: There is an unchanged left adrenal nodule measuring approximately 1.3 cm. --Right kidney/ureter: No hydronephrosis or radiopaque kidney stones. --Left kidney/ureter: No hydronephrosis or radiopaque kidney stones. --Urinary bladder: Unremarkable. Stomach/Bowel: --Stomach/Duodenum: No hiatal hernia or other gastric abnormality. Normal duodenal course and caliber. --Small bowel: Unremarkable. --Colon: Unremarkable. --Appendix: Normal. Vascular/Lymphatic: Atherosclerotic calcification is present within the non-aneurysmal abdominal aorta, without hemodynamically significant stenosis. --No retroperitoneal lymphadenopathy. --No mesenteric lymphadenopathy. --No pelvic or inguinal lymphadenopathy. Reproductive: Unremarkable Other: No ascites or free air. The abdominal wall is normal. Musculoskeletal. There is a new lytic lesion in the inferior aspect of the L5 vertebral body. There are lytic lesions involving the bilateral iliac bones that are new from prior study (axial series 2, image 106). There is a new lytic lesion in the left acetabulum (axial series 2, image 116). IMPRESSION: 1. Findings consistent with progressive metastatic disease as evidence by multiple new lytic osseous lesions involving the thorax, spine, and pelvis. 2. Slight interval increase in soft tissue at the left hilum. While this may represent post treatment changes, recurrent tumor is not excluded. 3. There is a trace left-sided pleural effusion. 4. Very abnormal appearance of the gallbladder with diffuse gallbladder wall thickening and hyperenhancement with intraluminal debris or sludge. Follow-up with ultrasound is recommended. 5. Mild dilatation of the common bile duct with probable filling defects within the distal common bile duct. Correlation with MRCP/ERCP is recommended. Electronically Signed: By: Constance Holster  M.D. On: 10/19/2020 19:22   CT ABDOMEN PELVIS W CONTRAST  Addendum Date: 10/20/2020   ADDENDUM REPORT: 10/20/2020 21:10 ADDENDUM: There is a new soft tissue nodule adjacent to the left kidney measuring approximately 2.4 cm. This is consistent with a new metastatic lesion. Electronically Signed   By: Constance Holster M.D.   On: 10/20/2020 21:10   Result Date: 10/20/2020 CLINICAL DATA:  Metastatic disease.  Small cell lung cancer. EXAM: CT CHEST, ABDOMEN, AND PELVIS WITH CONTRAST TECHNIQUE: Multidetector CT imaging of the chest, abdomen and pelvis was performed following the standard protocol during bolus administration of intravenous contrast. CONTRAST:  142m OMNIPAQUE IOHEXOL 300 MG/ML  SOLN COMPARISON:  CT dated 07/13/2020 FINDINGS: CT CHEST FINDINGS Cardiovascular: The heart size is unremarkable. There is a well-positioned right-sided Port-A-Cath. Minimal atherosclerotic changes are noted of the thoracic aorta without evidence for dissection or aneurysm. There is no large centrally located pulmonary embolism Mediastinum/Nodes: -- No mediastinal lymphadenopathy. --there is a persistently enlarged right hilar lymph node (axial series 2, image 30). This is relatively stable when compared to prior study. -- No axillary lymphadenopathy. -- No supraclavicular lymphadenopathy. -- Normal thyroid gland where visualized. -  Unremarkable esophagus. Lungs/Pleura: There are relatively stable post treatment changes of the left upper lobe. There is persistent soft tissue within the left hilum and surrounding the left mainstem bronchus. Overall the volume of soft tissue in the left hilum has slightly increased from prior study. There is bronchial wall thickening and mucus plugging involving the left lower lobe. There is a small left-sided pleural effusion,  new since prior study. There is some debris within the left mainstem bronchus. There are unchanged pulmonary nodules in the left upper lobe. Musculoskeletal: New  lytic lesions are noted involving the right scapula, right posterior fourth rib, the medial right posterior fifth rib, the medial posterior right seventh rib. Again noted is a lytic lesion involving the anterior left fifth rib. Additional lytic lesions are suspected for example in the left glenoid (axial series 4, image 71). CT ABDOMEN PELVIS FINDINGS Hepatobiliary: The liver is normal. There is a very abnormal appearance of the gallbladder with apparent gallbladder wall thickening and intraluminal debris or tumefactive sludge. This is new since the patient's prior CT. There is suggestion of sludge or small stones in the distal common bile duct (axial series 2, image 75).There is mild dilatation of the common bile duct measuring up to approximately 7 mm. Pancreas: Normal contours without ductal dilatation. No peripancreatic fluid collection. Spleen: Unremarkable. Adrenals/Urinary Tract: --Adrenal glands: There is an unchanged left adrenal nodule measuring approximately 1.3 cm. --Right kidney/ureter: No hydronephrosis or radiopaque kidney stones. --Left kidney/ureter: No hydronephrosis or radiopaque kidney stones. --Urinary bladder: Unremarkable. Stomach/Bowel: --Stomach/Duodenum: No hiatal hernia or other gastric abnormality. Normal duodenal course and caliber. --Small bowel: Unremarkable. --Colon: Unremarkable. --Appendix: Normal. Vascular/Lymphatic: Atherosclerotic calcification is present within the non-aneurysmal abdominal aorta, without hemodynamically significant stenosis. --No retroperitoneal lymphadenopathy. --No mesenteric lymphadenopathy. --No pelvic or inguinal lymphadenopathy. Reproductive: Unremarkable Other: No ascites or free air. The abdominal wall is normal. Musculoskeletal. There is a new lytic lesion in the inferior aspect of the L5 vertebral body. There are lytic lesions involving the bilateral iliac bones that are new from prior study (axial series 2, image 106). There is a new lytic lesion in  the left acetabulum (axial series 2, image 116). IMPRESSION: 1. Findings consistent with progressive metastatic disease as evidence by multiple new lytic osseous lesions involving the thorax, spine, and pelvis. 2. Slight interval increase in soft tissue at the left hilum. While this may represent post treatment changes, recurrent tumor is not excluded. 3. There is a trace left-sided pleural effusion. 4. Very abnormal appearance of the gallbladder with diffuse gallbladder wall thickening and hyperenhancement with intraluminal debris or sludge. Follow-up with ultrasound is recommended. 5. Mild dilatation of the common bile duct with probable filling defects within the distal common bile duct. Correlation with MRCP/ERCP is recommended. Electronically Signed: By: Constance Holster M.D. On: 10/19/2020 19:22   MR 3D Recon At Scanner  Result Date: 10/20/2020 CLINICAL DATA:  Cholelithiasis.  Enhancing gallbladder wall on CT. EXAM: MRI ABDOMEN WITHOUT AND WITH CONTRAST (INCLUDING MRCP) TECHNIQUE: Multiplanar multisequence MR imaging of the abdomen was performed both before and after the administration of intravenous contrast. Heavily T2-weighted images of the biliary and pancreatic ducts were obtained, and three-dimensional MRCP images were rendered by post processing. CONTRAST:  63m GADAVIST GADOBUTROL 1 MMOL/ML IV SOLN COMPARISON:  CT 10/19/2020, 07/13/2020 PET-CT 08/29/2019 FINDINGS: Lower chest: Lung bases are clear. Hepatobiliary: No focal hepatic lesion. There is gallbladder wall thickening involving the fundus of the gallbladder best seen on image 15/3 with thickening of single wall up to 10 mm. On postcontrast imaging there is no apparent enhancement of this of thickened gallbladder fundus. There is no intrahepatic biliary duct dilatation. The common bile duct normal caliber. No filling defect within the common bile duct. Pancreas: Pancreas is normal. No ductal dilatation. No pancreatic inflammation. Spleen:  Normal spleen Adrenals/urinary tract: Adrenal glands normal. Nonenhancing cysts of the RIGHT kidney. Medial to the  LEFT kidney there is an oblong lesion which is hypointense on T2 weighted imaging measuring 16 x 8 mm (image 20/15). There is mild enhancement of this lesion (image 47/series 24.) Stomach/Bowel: Stomach and limited view of the small bowel is unremarkable. Limited view of the colon is unremarkable Vascular/Lymphatic: Abdominal aorta normal caliber. No upper abdominal adenopathy. Other: No free fluid. Musculoskeletal: Enhancing lesion the posterior LEFT rib (image 20/24) IMPRESSION: 1. No evidence of choledocholithiasis.  No biliary obstruction. 2. Circumferential thickening of the gallbladder fundus without enhancement. Nonspecific finding. Favor chronic inflammation. Cannot exclude gallbladder metastasis however non enhancement would weigh against metastasis. 3. New retroperitoneal nodule medial to the LEFT kidney is concerning for lung cancer metastasis. Consider nonemergent outpatient FDG PET scan for further characterization (gallbladder could be assessed at same time). 4. 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 Electronically Signed   By: Suzy Bouchard M.D.   On: 10/20/2020 09:42   MR ABDOMEN MRCP W WO CONTAST  Result Date: 10/20/2020 CLINICAL DATA:  Cholelithiasis.  Enhancing gallbladder wall on CT. EXAM: MRI ABDOMEN WITHOUT AND WITH CONTRAST (INCLUDING MRCP) TECHNIQUE: Multiplanar multisequence MR imaging of the abdomen was performed both before and after the administration of intravenous contrast. Heavily T2-weighted images of the biliary and pancreatic ducts were obtained, and three-dimensional MRCP images were rendered by post processing. CONTRAST:  3m GADAVIST GADOBUTROL 1 MMOL/ML IV SOLN COMPARISON:  CT 10/19/2020, 07/13/2020 PET-CT 08/29/2019 FINDINGS: Lower chest: Lung bases are clear. Hepatobiliary: No focal hepatic  lesion. There is gallbladder wall thickening involving the fundus of the gallbladder best seen on image 15/3 with thickening of single wall up to 10 mm. On postcontrast imaging there is no apparent enhancement of this of thickened gallbladder fundus. There is no intrahepatic biliary duct dilatation. The common bile duct normal caliber. No filling defect within the common bile duct. Pancreas: Pancreas is normal. No ductal dilatation. No pancreatic inflammation. Spleen: Normal spleen Adrenals/urinary tract: Adrenal glands normal. Nonenhancing cysts of the RIGHT kidney. Medial to the LEFT kidney there is an oblong lesion which is hypointense on T2 weighted imaging measuring 16 x 8 mm (image 20/15). There is mild enhancement of this lesion (image 47/series 24.) Stomach/Bowel: Stomach and limited view of the small bowel is unremarkable. Limited view of the colon is unremarkable Vascular/Lymphatic: Abdominal aorta normal caliber. No upper abdominal adenopathy. Other: No free fluid. Musculoskeletal: Enhancing lesion the posterior LEFT rib (image 20/24) IMPRESSION: 1. No evidence of choledocholithiasis.  No biliary obstruction. 2. Circumferential thickening of the gallbladder fundus without enhancement. Nonspecific finding. Favor chronic inflammation. Cannot exclude gallbladder metastasis however non enhancement would weigh against metastasis. 3. New retroperitoneal nodule medial to the LEFT kidney is concerning for lung cancer metastasis. Consider nonemergent outpatient FDG PET scan for further characterization (gallbladder could be assessed at same time). 4. 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 Electronically Signed   By: SSuzy BouchardM.D.   On: 10/20/2020 09:42   DG Hip Unilat W or Wo Pelvis 2-3 Views Left  Result Date: 10/19/2020 CLINICAL DATA:  Severe hip pain EXAM: DG HIP (WITH OR WITHOUT PELVIS) 2-3V LEFT COMPARISON:  CT 05/11/2020, pelvis  radiograph 10/18/2020 FINDINGS: SI joints are patent. Pubic symphysis and rami appear intact. No fracture or malalignment. The joint space appears maintained IMPRESSION: Negative. Electronically Signed   By: KDonavan FoilM.D.   On: 10/19/2020 17:02   UKoreaAbdomen Limited  RUQ (LIVER/GB)  Result Date: 10/20/2020 CLINICAL DATA:  Elevated LFTs EXAM: ULTRASOUND ABDOMEN LIMITED RIGHT UPPER QUADRANT COMPARISON:  CT 10/19/2020 FINDINGS: Gallbladder: Contracted gallbladder with wall thickening. Similar to CT same day. Common bile duct: Diameter: Normal diameter at 5 mm Liver: Normal liver parenchyma. No duct dilatation. Portal vein is patent on color Doppler imaging with normal direction of blood flow towards the liver. Other: No free fluid IMPRESSION: 1. Contracted gallbladder with thickened wall. Findings similar to comparison CT same day. 2. Negative sonographic Murphy's sign. 3. No biliary duct dilatation. Electronically Signed   By: Suzy Bouchard M.D.   On: 10/20/2020 06:46      Subjective: - no chest pain, shortness of breath, no abdominal pain, nausea or vomiting.   Discharge Exam: BP 132/87 (BP Location: Left Arm)   Pulse 79   Temp 98.8 F (37.1 C) (Oral)   Resp 16   Ht 5' 10"  (1.778 m)   Wt 60.3 kg   SpO2 92%   BMI 19.08 kg/m   General: Pt is alert, awake, not in acute distress Cardiovascular: RRR, S1/S2 +, no rubs, no gallops Respiratory: CTA bilaterally, no wheezing, no rhonchi Abdominal: Soft, NT, ND, bowel sounds + Extremities: no edema, no cyanosis    The results of significant diagnostics from this hospitalization (including imaging, microbiology, ancillary and laboratory) are listed below for reference.     Microbiology: Recent Results (from the past 240 hour(s))  Resp Panel by RT-PCR (Flu A&B, Covid) Nasopharyngeal Swab     Status: None   Collection Time: 10/19/20  5:05 PM   Specimen: Nasopharyngeal Swab; Nasopharyngeal(NP) swabs in vial transport medium  Result  Value Ref Range Status   SARS Coronavirus 2 by RT PCR NEGATIVE NEGATIVE Final    Comment: (NOTE) SARS-CoV-2 target nucleic acids are NOT DETECTED.  The SARS-CoV-2 RNA is generally detectable in upper respiratory specimens during the acute phase of infection. The lowest concentration of SARS-CoV-2 viral copies this assay can detect is 138 copies/mL. A negative result does not preclude SARS-Cov-2 infection and should not be used as the sole basis for treatment or other patient management decisions. A negative result may occur with  improper specimen collection/handling, submission of specimen other than nasopharyngeal swab, presence of viral mutation(s) within the areas targeted by this assay, and inadequate number of viral copies(<138 copies/mL). A negative result must be combined with clinical observations, patient history, and epidemiological information. The expected result is Negative.  Fact Sheet for Patients:  EntrepreneurPulse.com.au  Fact Sheet for Healthcare Providers:  IncredibleEmployment.be  This test is no t yet approved or cleared by the [place] FDA and  has been authorized for detection and/or diagnosis of SARS-CoV-2 by FDA under an Emergency Use Authorization (EUA). This EUA will remain  in effect (meaning this test can be used) for the duration of the COVID-19 declaration under Section 564(b)(1) of the Act, 21 U.S.C.section 360bbb-3(b)(1), unless the authorization is terminated  or revoked sooner.       Influenza A by PCR NEGATIVE NEGATIVE Final   Influenza B by PCR NEGATIVE NEGATIVE Final    Comment: (NOTE) The Xpert Xpress SARS-CoV-2/FLU/RSV plus assay is intended as an aid in the diagnosis of influenza from Nasopharyngeal swab specimens and should not be used as a sole basis for treatment. Nasal washings and aspirates are unacceptable for Xpert Xpress SARS-CoV-2/FLU/RSV testing.  Fact Sheet for  Patients: EntrepreneurPulse.com.au  Fact Sheet for Healthcare Providers: IncredibleEmployment.be  This test is not yet approved or cleared  by the [place] and has been authorized for detection and/or diagnosis of SARS-CoV-2 by FDA under an Emergency Use Authorization (EUA). This EUA will remain in effect (meaning this test can be used) for the duration of the COVID-19 declaration under Section 564(b)(1) of the Act, 21 U.S.C. section 360bbb-3(b)(1), unless the authorization is terminated or revoked.  Performed at [facility], [place] [address] [place], [place] [address]      Labs: Basic Metabolic Panel: Recent Labs  Lab 10/19/20 1310 10/19/20 1724 10/20/20 0504 10/21/20 0540  NA 144 142 142 138  K 3.1* 2.8* 3.7 3.0*  CL 101 101 105 99  CO2 34*  --  29 30  GLUCOSE 106* 106* 93 80  BUN 8 5* 7 6  CREATININE 0.80 0.70 0.70 0.61  CALCIUM 14.1*  --  11.5* 9.8   Liver Function Tests: Recent Labs  Lab 10/19/20 1310 10/20/20 0504 10/21/20 0540  AST 26 88* 27  ALT 116* 113* 76*  ALKPHOS 370* 376* 278*  BILITOT 0.3 0.5 0.2*  PROT 6.3* 5.1* 5.3*  ALBUMIN 3.0* 2.6* 2.6*   CBC: Recent Labs  Lab 10/19/20 1310 10/19/20 1724 10/20/20 0504  WBC 7.7  --  4.7  NEUTROABS 6.3  --   --   HGB 11.6* 11.2* 10.5*  HCT 35.8* 33.0* 33.0*  MCV 97.3  --  101.5*  PLT 334  --  257   CBG: No results for input(s): GLUCAP in the last 168 hours. Hgb A1c No results for input(s): HGBA1C in the last 72 hours. Lipid Profile No results for input(s): CHOL, HDL, LDLCALC, TRIG, CHOLHDL, LDLDIRECT in the last 72 hours. Thyroid function studies No results for input(s): TSH, T4TOTAL, T3FREE, THYROIDAB in the last 72 hours.  Invalid input(s): FREET3 Urinalysis    Component Value Date/Time   COLORURINE YELLOW 03/19/2018 [place] 03/19/2018 1139   LABSPEC >=1.030 (A) 03/19/2018 1139   PHURINE 6.0 03/19/2018 1139    GLUCOSEU NEGATIVE 03/19/2018 1139   HGBUR NEGATIVE 03/19/2018 1139   BILIRUBINUR SMALL (A) 03/19/2018 1139   KETONESUR NEGATIVE 03/19/2018 1139   UROBILINOGEN 0.2 03/19/2018 1139   NITRITE NEGATIVE 03/19/2018 1139   LEUKOCYTESUR NEGATIVE 03/19/2018 1139    FURTHER DISCHARGE INSTRUCTIONS:   Get Medicines reviewed and adjusted: Please take all your medications with you for your next visit with your Primary MD   Laboratory/radiological data: Please request your Primary MD to go over all hospital tests and procedure/radiological results at the follow up, please ask your Primary MD to get all Hospital records sent to his/her office.   In some cases, they will be blood work, cultures and biopsy results pending at the time of your discharge. Please request that your primary care M.D. goes through all the records of your hospital data and follows up on these results.   Also Note the following: If you experience worsening of your admission symptoms, develop shortness of breath, life threatening emergency, suicidal or homicidal thoughts you must seek medical attention immediately by calling 911 or calling your MD immediately  if symptoms less severe.   You must read complete instructions/literature along with all the possible adverse reactions/side effects for all the Medicines you take and that have been prescribed to you. Take any new Medicines after you have completely understood and accpet all the possible adverse reactions/side effects.    Do not drive when taking Pain medications or sleeping medications (Benzodaizepines)   Do not take more than prescribed  Pain, Sleep and Anxiety Medications. It is not advisable to combine anxiety,sleep and pain medications without talking with your primary care practitioner   Special Instructions: If you have smoked or chewed Tobacco  in the last 2 yrs please stop smoking, stop any regular Alcohol  and or any Recreational drug use.   Wear Seat belts  while driving.   Please note: You were cared for by a hospitalist during your hospital stay. Once you are discharged, your primary care physician will handle any further medical issues. Please note that NO REFILLS for any discharge medications will be authorized once you are discharged, as it is imperative that you return to your primary care physician (or establish a relationship with a primary care physician if you do not have one) for your post hospital discharge needs so that they can reassess your need for medications and monitor your lab values.  Time coordinating discharge: 35 minutes  SIGNED:  Marzetta Board, MD, PhD 10/21/2020, 8:31 AM

## 2020-10-22 ENCOUNTER — Encounter: Payer: Self-pay | Admitting: *Deleted

## 2020-10-22 DIAGNOSIS — C3412 Malignant neoplasm of upper lobe, left bronchus or lung: Secondary | ICD-10-CM

## 2020-10-22 NOTE — Progress Notes (Signed)
I received a message from patient wife that per Dr. Darcey Nora patient needs to be seen with Dr. Julien Nordmann this week.  I updated Dr. Julien Nordmann and he can see him tomorrow.  I updated patient's wife of appt time and date.

## 2020-10-23 ENCOUNTER — Encounter: Payer: Self-pay | Admitting: *Deleted

## 2020-10-23 ENCOUNTER — Encounter: Payer: Self-pay | Admitting: Internal Medicine

## 2020-10-23 ENCOUNTER — Inpatient Hospital Stay: Payer: 59

## 2020-10-23 ENCOUNTER — Other Ambulatory Visit: Payer: Self-pay | Admitting: Internal Medicine

## 2020-10-23 ENCOUNTER — Other Ambulatory Visit: Payer: Self-pay | Admitting: Medical Oncology

## 2020-10-23 ENCOUNTER — Inpatient Hospital Stay: Payer: 59 | Admitting: Internal Medicine

## 2020-10-23 ENCOUNTER — Other Ambulatory Visit: Payer: Self-pay

## 2020-10-23 VITALS — BP 116/76 | HR 85 | Temp 98.2°F | Resp 18 | Ht 70.0 in | Wt 140.0 lb

## 2020-10-23 DIAGNOSIS — C321 Malignant neoplasm of supraglottis: Secondary | ICD-10-CM | POA: Insufficient documentation

## 2020-10-23 DIAGNOSIS — G939 Disorder of brain, unspecified: Secondary | ICD-10-CM | POA: Insufficient documentation

## 2020-10-23 DIAGNOSIS — C3412 Malignant neoplasm of upper lobe, left bronchus or lung: Secondary | ICD-10-CM

## 2020-10-23 DIAGNOSIS — R531 Weakness: Secondary | ICD-10-CM | POA: Diagnosis not present

## 2020-10-23 DIAGNOSIS — B20 Human immunodeficiency virus [HIV] disease: Secondary | ICD-10-CM

## 2020-10-23 DIAGNOSIS — Z79899 Other long term (current) drug therapy: Secondary | ICD-10-CM | POA: Insufficient documentation

## 2020-10-23 DIAGNOSIS — C3490 Malignant neoplasm of unspecified part of unspecified bronchus or lung: Secondary | ICD-10-CM | POA: Diagnosis not present

## 2020-10-23 DIAGNOSIS — C7951 Secondary malignant neoplasm of bone: Secondary | ICD-10-CM

## 2020-10-23 LAB — CBC WITH DIFFERENTIAL (CANCER CENTER ONLY)
Abs Immature Granulocytes: 0.02 10*3/uL (ref 0.00–0.07)
Basophils Absolute: 0 10*3/uL (ref 0.0–0.1)
Basophils Relative: 1 %
Eosinophils Absolute: 0.1 10*3/uL (ref 0.0–0.5)
Eosinophils Relative: 1 %
HCT: 34.3 % — ABNORMAL LOW (ref 39.0–52.0)
Hemoglobin: 11.3 g/dL — ABNORMAL LOW (ref 13.0–17.0)
Immature Granulocytes: 0 %
Lymphocytes Relative: 6 %
Lymphs Abs: 0.4 10*3/uL — ABNORMAL LOW (ref 0.7–4.0)
MCH: 31.4 pg (ref 26.0–34.0)
MCHC: 32.9 g/dL (ref 30.0–36.0)
MCV: 95.3 fL (ref 80.0–100.0)
Monocytes Absolute: 0.6 10*3/uL (ref 0.1–1.0)
Monocytes Relative: 10 %
Neutro Abs: 5.2 10*3/uL (ref 1.7–7.7)
Neutrophils Relative %: 82 %
Platelet Count: 328 10*3/uL (ref 150–400)
RBC: 3.6 MIL/uL — ABNORMAL LOW (ref 4.22–5.81)
RDW: 11.7 % (ref 11.5–15.5)
WBC Count: 6.4 10*3/uL (ref 4.0–10.5)
nRBC: 0 % (ref 0.0–0.2)

## 2020-10-23 LAB — CMP (CANCER CENTER ONLY)
ALT: 52 U/L — ABNORMAL HIGH (ref 0–44)
AST: 16 U/L (ref 15–41)
Albumin: 2.7 g/dL — ABNORMAL LOW (ref 3.5–5.0)
Alkaline Phosphatase: 259 U/L — ABNORMAL HIGH (ref 38–126)
Anion gap: 8 (ref 5–15)
BUN: 8 mg/dL (ref 6–20)
CO2: 30 mmol/L (ref 22–32)
Calcium: 10.2 mg/dL (ref 8.9–10.3)
Chloride: 103 mmol/L (ref 98–111)
Creatinine: 0.78 mg/dL (ref 0.61–1.24)
GFR, Estimated: >60 mL/min (ref >60)
Glucose, Bld: 122 mg/dL — ABNORMAL HIGH (ref 70–99)
Potassium: 3.3 mmol/L — ABNORMAL LOW (ref 3.5–5.1)
Sodium: 141 mmol/L (ref 135–145)
Total Bilirubin: 0.4 mg/dL (ref 0.3–1.2)
Total Protein: 5.9 g/dL — ABNORMAL LOW (ref 6.5–8.1)

## 2020-10-23 MED ORDER — FENTANYL 25 MCG/HR TD PT72: 1.0000 | MEDICATED_PATCH | TRANSDERMAL | 0 refills | Status: DC

## 2020-10-23 MED ORDER — OXYCODONE HCL 10 MG PO TABS: 10.0000 mg | ORAL_TABLET | ORAL | 0 refills | Status: DC | PRN

## 2020-10-23 NOTE — Progress Notes (Signed)
I received a message from Dr. Johnnye Sima regarding Trevor Zavala.  He asked for HIV RNA to be drawn.  I notified Dr. Julien Nordmann and he ordered. I spoke to lab today to see if they could run on labs obtained today.  I was told no.  I notified scheduling dept to call and schedule patient for lab.

## 2020-10-23 NOTE — Progress Notes (Signed)
[place] [phone]   [phone]  OFFICE PROGRESS NOTE  [name], [name], MD [place] [address]  DIAGNOSIS:  1) Stage IIB/IV (T3, N3, M1a) non-small cell lung cancer, squamous cell carcinoma presented with large left hilar mass in addition to mediastinal and left supraclavicular lymphadenopathy as well as contralateral right upper lobe nodule diagnosed in August 2018. PDL 1 expression: 90%. 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 First dose 06/29/2017. Status post 6 cycles. Last dose was given 08/03/2017. 2)  Immunotherapy with Ketruda 200 MG IV every 3 weeks, first dose 09/17/2017.  Status post 35 cycles. 3) palliative radiotherapy to the painful lesions in the left fifth rib under the care of Dr. Tammi Klippel.  CURRENT THERAPY: Observation.  INTERVAL HISTORY: Trevor Zavala 54 y.o. male returns to the clinic today for follow-up visit accompanied by his wife.  The patient is complaining of increasing fatigue and weakness as well as pain and the right hip area and the chest.  He was recently admitted to [facility] with significant fatigue and weakness and he was found to have significant elevation of his serum calcium consistent with hypercalcemia of malignancy.  The patient was treated with IV hydration, Lasix as well as Zometa.  His calcium has improved before discharge.  The patient also had hypokalemia at that time and he received potassium supplements.  He denied having any current chest pain, shortness of breath, cough or hemoptysis.  He has no nausea, vomiting, diarrhea but has occasional constipation.  He lost several pounds recently.  He has a suspicious lesion in the skull area.  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  The patient is here today for  evaluation and discussion of his treatment options.  MEDICAL HISTORY: Past Medical History:  Diagnosis Date   Anxiety    COPD (chronic obstructive pulmonary disease) (HCC)    GERD (gastroesophageal reflux disease)    Headache    Hypertension    Pneumonia    Situational depression    Stage III squamous cell carcinoma of left lung ([facility]) 06/19/2017   Lungs & Epiglottis   Tubular adenoma of colon 01/2015   Tubular adenoma of colon 01/2015    ALLERGIES:  is allergic to ativan [lorazepam] and escitalopram oxalate.  MEDICATIONS:  Current Outpatient Medications  Medication Sig Dispense Refill   acyclovir (ZOVIRAX) 400 MG tablet Take 1 tablet (400 mg total) by mouth 3 (three) times daily. (Patient not taking: No sig reported) 21 tablet 3   clonazePAM (KLONOPIN) 0.5 MG tablet TAKE 1 OR 2 TABLETS BY MOUTH TWICE A DAY AS NEEDED FOR ANXIETY (Patient taking differently: Take 0.25 mg by mouth daily.) 180 tablet 0   docusate sodium (COLACE) 100 MG capsule Take 200 mg by mouth daily.     esomeprazole (NEXIUM) 40 MG capsule TAKE 1 CAPSULE BY MOUTH EVERY DAY IN THE MORNING (Patient taking differently: Take 40 mg by mouth daily.) 90 capsule 3   fluticasone furoate-vilanterol (BREO ELLIPTA) 100-25 MCG/INH AEPB Inhale 1 puff into the lungs daily. Annual appt is overdue must see provider for future refills (Patient taking differently: Inhale 1 puff into the lungs daily.) 60 each 0   folic acid (FOLVITE) 1 MG tablet Take 1 mg by mouth daily.     Multiple Vitamin (MULTIVITAMIN  WITH MINERALS) TABS tablet Take 1 tablet by mouth daily.     oxyCODONE 10 MG TABS Take 1 tablet (10 mg total) by mouth every 4 (four) hours as needed for severe pain. 28 tablet 0   No current facility-administered medications for this visit.    SURGICAL HISTORY:  Past Surgical History:  Procedure Laterality Date   COLONOSCOPY     DIRECT LARYNGOSCOPY N/A 06/25/2017   Procedure: DIRECT LARYNGOSCOPY AND BIOPSY;   Surgeon: Rozetta Nunnery, MD;  Location: [facility];  Service: ENT;  Laterality: N/A;   EAR CYST EXCISION N/A 11/17/2013   Procedure: SEBACEOUS CYST CHEST;  Surgeon: Joyice Faster. Cornett, MD;  Location: [place];  Service: General;  Laterality: N/A;   IR GASTROSTOMY TUBE MOD SED  08/10/2017   IR GASTROSTOMY TUBE REMOVAL  09/29/2017   KNEE ARTHROSCOPY     LEFT   LIPOMA EXCISION N/A 11/17/2013   Procedure: EXCISION LIPOMA FOREHEAD;  Surgeon: Joyice Faster. Cornett, MD;  Location: [place];  Service: General;  Laterality: N/A;   LUNG BIOPSY Bilateral 06/12/2017   Procedure: LEFT LUNG BIOPSY;  Surgeon: Grace Isaac, MD;  Location: [facility];  Service: Thoracic;  Laterality: Bilateral;   MICROLARYNGOSCOPY W/VOCAL CORD INJECTION Left 07/16/2018   Procedure: suspened micro laryngoscopy with jet ventilation and prolaryn injection;  Surgeon: Melida Quitter, MD;  Location: [facility];  Service: ENT;  Laterality: Left;   PORTACATH PLACEMENT Right 07/01/2017   Procedure: INSERTION PORT-A-CATH - RIGHT IJ - placed with Fluoro and Ultrasound;  Surgeon: Grace Isaac, MD;  Location: [facility];  Service: Thoracic;  Laterality: Right;   VIDEO BRONCHOSCOPY WITH ENDOBRONCHIAL ULTRASOUND N/A 06/12/2017   Procedure: VIDEO BRONCHOSCOPY WITH ENDOBRONCHIAL ULTRASOUND;  Surgeon: Grace Isaac, MD;  Location: [place];  Service: Thoracic;  Laterality: N/A;    REVIEW OF SYSTEMS:  Constitutional: positive for anorexia, fatigue and weight loss Eyes: negative Ears, nose, mouth, throat, and face: negative Respiratory: negative Cardiovascular: negative Gastrointestinal: positive for constipation Genitourinary:negative Integument/breast: negative Hematologic/lymphatic: negative Musculoskeletal:positive for bone pain Neurological: negative Behavioral/Psych: negative Endocrine: negative Allergic/Immunologic: negative   PHYSICAL EXAMINATION: General appearance: alert,  cooperative, fatigued and no distress Head: Normocephalic, without obvious abnormality, atraumatic Neck: no adenopathy, no JVD, supple, symmetrical, trachea midline and thyroid not enlarged, symmetric, no tenderness/mass/nodules Lymph nodes: Cervical, supraclavicular, and axillary nodes normal. Resp: clear to auscultation bilaterally Back: symmetric, no curvature. ROM normal. No CVA tenderness. Cardio: regular rate and rhythm, S1, S2 normal, no murmur, click, rub or gallop GI: soft, non-tender; bowel sounds normal; no masses,  no organomegaly Extremities: extremities normal, atraumatic, no cyanosis or edema Neurologic: Alert and oriented X 3, normal strength and tone. Normal symmetric reflexes. Normal coordination and gait  ECOG PERFORMANCE STATUS: 1 - Symptomatic but completely ambulatory  Blood pressure 116/76, pulse 85, temperature 98.2 F (36.8 C), temperature source Tympanic, resp. rate 18, height 5' 10"  (1.778 m), weight 140 lb (63.5 kg), SpO2 96 %.  LABORATORY DATA: Lab Results  Component Value Date   WBC 6.4 10/23/2020   HGB 11.3 (L) 10/23/2020   HCT 34.3 (L) 10/23/2020   MCV 95.3 10/23/2020   PLT 328 10/23/2020      Chemistry      Component Value Date/Time   NA 141 10/23/2020 0840   NA 141 10/29/2017 1049   K 3.3 (L) 10/23/2020 0840   K 3.3 (L) 10/29/2017 1049   CL 103 10/23/2020 0840   CO2 30 10/23/2020 0840  CO2 26 10/29/2017 1049   BUN 8 10/23/2020 0840   BUN 11.2 10/29/2017 1049   CREATININE 0.78 10/23/2020 0840   CREATININE 0.7 10/29/2017 1049   GLU 198 08/25/2017 0000      Component Value Date/Time   CALCIUM 10.2 10/23/2020 0840   CALCIUM 9.0 10/29/2017 1049   ALKPHOS 259 (H) 10/23/2020 0840   ALKPHOS 82 10/29/2017 1049   AST 16 10/23/2020 0840   AST 10 10/29/2017 1049   ALT 52 (H) 10/23/2020 0840   ALT 8 10/29/2017 1049   BILITOT 0.4 10/23/2020 0840   BILITOT 0.22 10/29/2017 1049       RADIOGRAPHIC STUDIES: DG Chest 2 View  Result Date:  10/19/2020 CLINICAL DATA:  Rib pain.  History of bone metastasis. EXAM: CHEST - 2 VIEW COMPARISON:  Chest x-ray 07/30/2020.  CT 07/13/2020, 03/02/2020. FINDINGS: PowerPort catheter noted with tip over the right atrium. Heart size normal. Stable left perihilar and left mid lung densities again noted, most consistent with scarring. Stable nodular opacities left upper lung. No acute infiltrate. Mild left base pleural thickening. No prominent pleural effusion. No pneumothorax. No acute bony abnormality identified. Degenerative change thoracic spine. IMPRESSION: 1. PowerPort catheter noted with tip over right atrium. 2. Stable left perihilar and left mid lung densities again noted, most consistent with scarring. Stable nodular opacities left upper lung. No acute infiltrate. Electronically Signed   By: Marcello Moores  Register   On: 10/19/2020 17:03   DG Lumbar Spine Complete  Result Date: 10/19/2020 CLINICAL DATA:  Abnormal lab draw severe left hip pain back pain EXAM: LUMBAR SPINE - COMPLETE 4+ VIEW COMPARISON:  CT 05/11/2020 FINDINGS: Lumbar alignment within normal limits. Vertebral body heights are maintained. Mild degenerative changes at multiple levels. Mild facet degenerative changes of the lower lumbar spine. Aortic atherosclerosis IMPRESSION: Mild degenerative change. Electronically Signed   By: Donavan Foil M.D.   On: 10/19/2020 17:01   CT HEAD WO CONTRAST  Result Date: 10/19/2020 CLINICAL DATA:  54 year old male with non-small cell lung cancer undergoing treatment. Dizziness. Fatigue and weakness. EXAM: CT HEAD WITHOUT CONTRAST TECHNIQUE: Contiguous axial images were obtained from the base of the skull through the vertex without intravenous contrast. COMPARISON:  Neck CT 07/13/2020. PET-CT 08/29/2019. Brain MRI 06/12/2017. FINDINGS: Brain: Abnormal malignant dural thickening along the left frontal convexity (series 2, image 20) associated with overlying lytic skull metastasis. Similar dural thickening also  possible along the left posterior vertex, and at the left anterior temporal lobe related to skull metastases there. Questionable mild cerebral edema in the left anterior temporal tip (series 2, image 8). No intracranial mass effect, midline shift. No ventriculomegaly. No acute intracranial hemorrhage identified. No cortically based acute infarct identified. Chronic partially empty sella. Vascular: No suspicious intracranial vascular hyperdensity. Skull: Multiple relatively large lytic skull metastases. Along the left anterior frontal bone there is a roughly 4.2 cm diameter lytic metastasis which appears partially necrotic (series 2, image 22) and is associated with both underlying dural thickening (image 20) and overlying scalp soft tissue mass. Smaller 3.3 cm lytic metastasis along the posterior vertex at the confluence of the lambdoid sutures just to the left of midline. Elongated or 2 adjacent lytic metastases at the left middle cranial fossa including the left greater sphenoid wing and along the left lateral coronal suture (coronal image 28). Note that the sphenoid wing lesion also has eroded the superolateral wall of the left orbit (series 5, image 24) but does not bulge into the orbit at this time. Sinuses/Orbits:  Previous left mastoidectomy. Visualized paranasal sinuses and mastoids are stable and well pneumatized. Other: Abnormal scalp soft tissue thickening and heterogeneity overlying the lytic skull metastases (most notably left superior forehead series 2, image 22). Orbits soft tissues appear to remain normal. IMPRESSION: 1. Multiple relatively large lytic skull metastases, including: - dominant left anterior frontal bone metastasis which appears partially necrotic and extends into both the overlying scalp and underlying dura, and - left middle cranial fossa skull met which has eroded a portion of the left orbital wall. 2. Several areas of associated metastatic dural thickening suspected. Possible  associated mild vasogenic edema at the left temporal lobe tip. 3. But no intracranial mass effect or midline shift. Electronically Signed   By: Genevie Ann M.D.   On: 10/19/2020 19:35   CT CHEST W CONTRAST  Addendum Date: 10/20/2020   ADDENDUM REPORT: 10/20/2020 21:10 ADDENDUM: There is a new soft tissue nodule adjacent to the left kidney measuring approximately 2.4 cm. This is consistent with a new metastatic lesion. Electronically Signed   By: Constance Holster M.D.   On: 10/20/2020 21:10   Result Date: 10/20/2020 CLINICAL DATA:  Metastatic disease.  Small cell lung cancer. EXAM: CT CHEST, ABDOMEN, AND PELVIS WITH CONTRAST TECHNIQUE: Multidetector CT imaging of the chest, abdomen and pelvis was performed following the standard protocol during bolus administration of intravenous contrast. CONTRAST:  138m OMNIPAQUE IOHEXOL 300 MG/ML  SOLN COMPARISON:  CT dated 07/13/2020 FINDINGS: CT CHEST FINDINGS Cardiovascular: The heart size is unremarkable. There is a well-positioned right-sided Port-A-Cath. Minimal atherosclerotic changes are noted of the thoracic aorta without evidence for dissection or aneurysm. There is no large centrally located pulmonary embolism Mediastinum/Nodes: -- No mediastinal lymphadenopathy. --there is a persistently enlarged right hilar lymph node (axial series 2, image 30). This is relatively stable when compared to prior study. -- No axillary lymphadenopathy. -- No supraclavicular lymphadenopathy. -- Normal thyroid gland where visualized. -  Unremarkable esophagus. Lungs/Pleura: There are relatively stable post treatment changes of the left upper lobe. There is persistent soft tissue within the left hilum and surrounding the left mainstem bronchus. Overall the volume of soft tissue in the left hilum has slightly increased from prior study. There is bronchial wall thickening and mucus plugging involving the left lower lobe. There is a small left-sided pleural effusion, new since prior  study. There is some debris within the left mainstem bronchus. There are unchanged pulmonary nodules in the left upper lobe. Musculoskeletal: New lytic lesions are noted involving the right scapula, right posterior fourth rib, the medial right posterior fifth rib, the medial posterior right seventh rib. Again noted is a lytic lesion involving the anterior left fifth rib. Additional lytic lesions are suspected for example in the left glenoid (axial series 4, image 71). CT ABDOMEN PELVIS FINDINGS Hepatobiliary: The liver is normal. There is a very abnormal appearance of the gallbladder with apparent gallbladder wall thickening and intraluminal debris or tumefactive sludge. This is new since the patient's prior CT. There is suggestion of sludge or small stones in the distal common bile duct (axial series 2, image 75).There is mild dilatation of the common bile duct measuring up to approximately 7 mm. Pancreas: Normal contours without ductal dilatation. No peripancreatic fluid collection. Spleen: Unremarkable. Adrenals/Urinary Tract: --Adrenal glands: There is an unchanged left adrenal nodule measuring approximately 1.3 cm. --Right kidney/ureter: No hydronephrosis or radiopaque kidney stones. --Left kidney/ureter: No hydronephrosis or radiopaque kidney stones. --Urinary bladder: Unremarkable. Stomach/Bowel: --Stomach/Duodenum: No hiatal hernia or other gastric abnormality.  Normal duodenal course and caliber. --Small bowel: Unremarkable. --Colon: Unremarkable. --Appendix: Normal. Vascular/Lymphatic: Atherosclerotic calcification is present within the non-aneurysmal abdominal aorta, without hemodynamically significant stenosis. --No retroperitoneal lymphadenopathy. --No mesenteric lymphadenopathy. --No pelvic or inguinal lymphadenopathy. Reproductive: Unremarkable Other: No ascites or free air. The abdominal wall is normal. Musculoskeletal. There is a new lytic lesion in the inferior aspect of the L5 vertebral body. There  are lytic lesions involving the bilateral iliac bones that are new from prior study (axial series 2, image 106). There is a new lytic lesion in the left acetabulum (axial series 2, image 116). IMPRESSION: 1. Findings consistent with progressive metastatic disease as evidence by multiple new lytic osseous lesions involving the thorax, spine, and pelvis. 2. Slight interval increase in soft tissue at the left hilum. While this may represent post treatment changes, recurrent tumor is not excluded. 3. There is a trace left-sided pleural effusion. 4. Very abnormal appearance of the gallbladder with diffuse gallbladder wall thickening and hyperenhancement with intraluminal debris or sludge. Follow-up with ultrasound is recommended. 5. Mild dilatation of the common bile duct with probable filling defects within the distal common bile duct. Correlation with MRCP/ERCP is recommended. Electronically Signed: By: Constance Holster M.D. On: 10/19/2020 19:22   CT ABDOMEN PELVIS W CONTRAST  Addendum Date: 10/20/2020   ADDENDUM REPORT: 10/20/2020 21:10 ADDENDUM: There is a new soft tissue nodule adjacent to the left kidney measuring approximately 2.4 cm. This is consistent with a new metastatic lesion. Electronically Signed   By: Constance Holster M.D.   On: 10/20/2020 21:10   Result Date: 10/20/2020 CLINICAL DATA:  Metastatic disease.  Small cell lung cancer. EXAM: CT CHEST, ABDOMEN, AND PELVIS WITH CONTRAST TECHNIQUE: Multidetector CT imaging of the chest, abdomen and pelvis was performed following the standard protocol during bolus administration of intravenous contrast. CONTRAST:  133m OMNIPAQUE IOHEXOL 300 MG/ML  SOLN COMPARISON:  CT dated 07/13/2020 FINDINGS: CT CHEST FINDINGS Cardiovascular: The heart size is unremarkable. There is a well-positioned right-sided Port-A-Cath. Minimal atherosclerotic changes are noted of the thoracic aorta without evidence for dissection or aneurysm. There is no large centrally  located pulmonary embolism Mediastinum/Nodes: -- No mediastinal lymphadenopathy. --there is a persistently enlarged right hilar lymph node (axial series 2, image 30). This is relatively stable when compared to prior study. -- No axillary lymphadenopathy. -- No supraclavicular lymphadenopathy. -- Normal thyroid gland where visualized. -  Unremarkable esophagus. Lungs/Pleura: There are relatively stable post treatment changes of the left upper lobe. There is persistent soft tissue within the left hilum and surrounding the left mainstem bronchus. Overall the volume of soft tissue in the left hilum has slightly increased from prior study. There is bronchial wall thickening and mucus plugging involving the left lower lobe. There is a small left-sided pleural effusion, new since prior study. There is some debris within the left mainstem bronchus. There are unchanged pulmonary nodules in the left upper lobe. Musculoskeletal: New lytic lesions are noted involving the right scapula, right posterior fourth rib, the medial right posterior fifth rib, the medial posterior right seventh rib. Again noted is a lytic lesion involving the anterior left fifth rib. Additional lytic lesions are suspected for example in the left glenoid (axial series 4, image 71). CT ABDOMEN PELVIS FINDINGS Hepatobiliary: The liver is normal. There is a very abnormal appearance of the gallbladder with apparent gallbladder wall thickening and intraluminal debris or tumefactive sludge. This is new since the patient's prior CT. There is suggestion of sludge or small stones in  the distal common bile duct (axial series 2, image 75).There is mild dilatation of the common bile duct measuring up to approximately 7 mm. Pancreas: Normal contours without ductal dilatation. No peripancreatic fluid collection. Spleen: Unremarkable. Adrenals/Urinary Tract: --Adrenal glands: There is an unchanged left adrenal nodule measuring approximately 1.3 cm. --Right  kidney/ureter: No hydronephrosis or radiopaque kidney stones. --Left kidney/ureter: No hydronephrosis or radiopaque kidney stones. --Urinary bladder: Unremarkable. Stomach/Bowel: --Stomach/Duodenum: No hiatal hernia or other gastric abnormality. Normal duodenal course and caliber. --Small bowel: Unremarkable. --Colon: Unremarkable. --Appendix: Normal. Vascular/Lymphatic: Atherosclerotic calcification is present within the non-aneurysmal abdominal aorta, without hemodynamically significant stenosis. --No retroperitoneal lymphadenopathy. --No mesenteric lymphadenopathy. --No pelvic or inguinal lymphadenopathy. Reproductive: Unremarkable Other: No ascites or free air. The abdominal wall is normal. Musculoskeletal. There is a new lytic lesion in the inferior aspect of the L5 vertebral body. There are lytic lesions involving the bilateral iliac bones that are new from prior study (axial series 2, image 106). There is a new lytic lesion in the left acetabulum (axial series 2, image 116). IMPRESSION: 1. Findings consistent with progressive metastatic disease as evidence by multiple new lytic osseous lesions involving the thorax, spine, and pelvis. 2. Slight interval increase in soft tissue at the left hilum. While this may represent post treatment changes, recurrent tumor is not excluded. 3. There is a trace left-sided pleural effusion. 4. Very abnormal appearance of the gallbladder with diffuse gallbladder wall thickening and hyperenhancement with intraluminal debris or sludge. Follow-up with ultrasound is recommended. 5. Mild dilatation of the common bile duct with probable filling defects within the distal common bile duct. Correlation with MRCP/ERCP is recommended. Electronically Signed: By: Constance Holster M.D. On: 10/19/2020 19:22   MR 3D Recon At Scanner  Result Date: 10/20/2020 CLINICAL DATA:  Cholelithiasis.  Enhancing gallbladder wall on CT. EXAM: MRI ABDOMEN WITHOUT AND WITH CONTRAST (INCLUDING MRCP)  TECHNIQUE: Multiplanar multisequence MR imaging of the abdomen was performed both before and after the administration of intravenous contrast. Heavily T2-weighted images of the biliary and pancreatic ducts were obtained, and three-dimensional MRCP images were rendered by post processing. CONTRAST:  74m GADAVIST GADOBUTROL 1 MMOL/ML IV SOLN COMPARISON:  CT 10/19/2020, 07/13/2020 PET-CT 08/29/2019 FINDINGS: Lower chest: Lung bases are clear. Hepatobiliary: No focal hepatic lesion. There is gallbladder wall thickening involving the fundus of the gallbladder best seen on image 15/3 with thickening of single wall up to 10 mm. On postcontrast imaging there is no apparent enhancement of this of thickened gallbladder fundus. There is no intrahepatic biliary duct dilatation. The common bile duct normal caliber. No filling defect within the common bile duct. Pancreas: Pancreas is normal. No ductal dilatation. No pancreatic inflammation. Spleen: Normal spleen Adrenals/urinary tract: Adrenal glands normal. Nonenhancing cysts of the RIGHT kidney. Medial to the LEFT kidney there is an oblong lesion which is hypointense on T2 weighted imaging measuring 16 x 8 mm (image 20/15). There is mild enhancement of this lesion (image 47/series 24.) Stomach/Bowel: Stomach and limited view of the small bowel is unremarkable. Limited view of the colon is unremarkable Vascular/Lymphatic: Abdominal aorta normal caliber. No upper abdominal adenopathy. Other: No free fluid. Musculoskeletal: Enhancing lesion the posterior LEFT rib (image 20/24) IMPRESSION: 1. No evidence of choledocholithiasis.  No biliary obstruction. 2. Circumferential thickening of the gallbladder fundus without enhancement. Nonspecific finding. Favor chronic inflammation. Cannot exclude gallbladder metastasis however non enhancement would weigh against metastasis. 3. New retroperitoneal nodule medial to the LEFT kidney is concerning for lung cancer metastasis. Consider  nonemergent  outpatient FDG PET scan for further characterization (gallbladder could be assessed at same time). 4. 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 Electronically Signed   By: Suzy Bouchard M.D.   On: 10/20/2020 09:42   MR ABDOMEN MRCP W WO CONTAST  Result Date: 10/20/2020 CLINICAL DATA:  Cholelithiasis.  Enhancing gallbladder wall on CT. EXAM: MRI ABDOMEN WITHOUT AND WITH CONTRAST (INCLUDING MRCP) TECHNIQUE: Multiplanar multisequence MR imaging of the abdomen was performed both before and after the administration of intravenous contrast. Heavily T2-weighted images of the biliary and pancreatic ducts were obtained, and three-dimensional MRCP images were rendered by post processing. CONTRAST:  39m GADAVIST GADOBUTROL 1 MMOL/ML IV SOLN COMPARISON:  CT 10/19/2020, 07/13/2020 PET-CT 08/29/2019 FINDINGS: Lower chest: Lung bases are clear. Hepatobiliary: No focal hepatic lesion. There is gallbladder wall thickening involving the fundus of the gallbladder best seen on image 15/3 with thickening of single wall up to 10 mm. On postcontrast imaging there is no apparent enhancement of this of thickened gallbladder fundus. There is no intrahepatic biliary duct dilatation. The common bile duct normal caliber. No filling defect within the common bile duct. Pancreas: Pancreas is normal. No ductal dilatation. No pancreatic inflammation. Spleen: Normal spleen Adrenals/urinary tract: Adrenal glands normal. Nonenhancing cysts of the RIGHT kidney. Medial to the LEFT kidney there is an oblong lesion which is hypointense on T2 weighted imaging measuring 16 x 8 mm (image 20/15). There is mild enhancement of this lesion (image 47/series 24.) Stomach/Bowel: Stomach and limited view of the small bowel is unremarkable. Limited view of the colon is unremarkable Vascular/Lymphatic: Abdominal aorta normal caliber. No upper abdominal adenopathy. Other: No free fluid.  Musculoskeletal: Enhancing lesion the posterior LEFT rib (image 20/24) IMPRESSION: 1. No evidence of choledocholithiasis.  No biliary obstruction. 2. Circumferential thickening of the gallbladder fundus without enhancement. Nonspecific finding. Favor chronic inflammation. Cannot exclude gallbladder metastasis however non enhancement would weigh against metastasis. 3. New retroperitoneal nodule medial to the LEFT kidney is concerning for lung cancer metastasis. Consider nonemergent outpatient FDG PET scan for further characterization (gallbladder could be assessed at same time). 4. 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 Electronically Signed   By: SSuzy BouchardM.D.   On: 10/20/2020 09:42   DG Hip Unilat W or Wo Pelvis 2-3 Views Left  Result Date: 10/19/2020 CLINICAL DATA:  Severe hip pain EXAM: DG HIP (WITH OR WITHOUT PELVIS) 2-3V LEFT COMPARISON:  CT 05/11/2020, pelvis radiograph 10/18/2020 FINDINGS: SI joints are patent. Pubic symphysis and rami appear intact. No fracture or malalignment. The joint space appears maintained IMPRESSION: Negative. Electronically Signed   By: KDonavan FoilM.D.   On: 10/19/2020 17:02   [place]Abdomen Limited RUQ (LIVER/GB)  Result Date: 10/20/2020 CLINICAL DATA:  Elevated LFTs EXAM: ULTRASOUND ABDOMEN LIMITED RIGHT UPPER QUADRANT COMPARISON:  CT 10/19/2020 FINDINGS: Gallbladder: Contracted gallbladder with wall thickening. Similar to CT same day. Common bile duct: Diameter: Normal diameter at 5 mm Liver: Normal liver parenchyma. No duct dilatation. Portal vein is patent on color Doppler imaging with normal direction of blood flow towards the liver. Other: No free fluid IMPRESSION: 1. Contracted gallbladder with thickened wall. Findings similar to comparison CT same day. 2. Negative sonographic Murphy's sign. 3. No biliary duct dilatation. Electronically Signed   By: SSuzy BouchardM.D.   On: 10/20/2020 06:46     ASSESSMENT AND PLAN: This is a very pleasant 54years old  white male with recently diagnosed stage IIIB/IV non-small cell lung cancer, adenocarcinoma presented with large left hilar mass in addition to mediastinal and left supraclavicular lymphadenopathy as well as suspicious right upper lobe pulmonary nodule diagnosed in August 2018. 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 He underwent a course of concurrent chemoradiation to the lung as well as the epiglottic area under the care of Dr. Tammi Klippel. He is status post 6 cycle. He tolerated this course of treatment well except for the radiation induced esophagitis as well as weight loss and fatigue. The patient had partial response to the previous treatment. He completed treatment with Keytruda 200 mg IV every 3 weeks status post 35 cycles. 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 He was admitted to the hospital with hypercalcemia of malignancy and imaging studies showed significant evidence for disease progression involving the bone as well as a mass close to the left kidney and also suspicious right neck lymphadenopathy. I recommended for the patient to have a PET scan and MRI of the brain performed for further evaluation of his disease and also to identify a lesion for future biopsy if needed. I will see the patient back for follow-up visit in 2 weeks for more detailed discussion of his treatment options based on the PET scan and MRI results. For the hypercalcemia, the patient was treated with normal saline as well as Lasix and Zometa.  His calcium level was back to the normal range.  I encouraged him to increase his oral intake. For pain management, I will start the patient on fentanyl patch 25 mcg/hour every 3 days and he will continue with oxycodone for breakthrough pain.   He will also see Dr. Tammi Klippel for consideration of palliative radiotherapy to the painful bone lesions.  The patient was advised to call immediately if he has any concerning symptoms in the interval.  The patient voices understanding of current disease status and treatment options and is in agreement with the current care plan. All questions were answered. The patient knows to call the clinic with any problems, questions or concerns. We can certainly see the patient much sooner if necessary. Disclaimer: This note was dictated with voice recognition software. Similar sounding words can inadvertently be transcribed and may not be corrected upon review.

## 2020-10-23 NOTE — Progress Notes (Signed)
Called ICD -10 code t [facility] for Fentanyl - G89.3-ccancer related pain.

## 2020-10-23 NOTE — Patient Instructions (Signed)
Steps to Quit Smoking Smoking tobacco is the leading cause of preventable death. It can affect almost every organ in the body. Smoking puts you and people around you at risk for many serious, long-lasting (chronic) diseases. Quitting smoking can be hard, but it is one of the best things that you can do for your health. It is never too late to quit. How do I get ready to quit? When you decide to quit smoking, make a plan to help you succeed. Before you quit:  Pick a date to quit. Set a date within the next 2 weeks to give you time to prepare.  Write down the reasons why you are quitting. Keep this list in places where you will see it often.  Tell your family, friends, and co-workers that you are quitting. Their support is important.  Talk with your doctor about the choices that may help you quit.  Find out if your health insurance will pay for these treatments.  Know the people, places, things, and activities that make you want to smoke (triggers). Avoid them. What first steps can I take to quit smoking?  Throw away all cigarettes at home, at work, and in your car.  Throw away the things that you use when you smoke, such as ashtrays and lighters.  Clean your car. Make sure to empty the ashtray.  Clean your home, including curtains and carpets. What can I do to help me quit smoking? Talk with your doctor about taking medicines and seeing a counselor at the same time. You are more likely to succeed when you do both.  If you are pregnant or breastfeeding, talk with your doctor about counseling or other ways to quit smoking. Do not take medicine to help you quit smoking unless your doctor tells you to do so. To quit smoking: Quit right away  Quit smoking totally, instead of slowly cutting back on how much you smoke over a period of time.  Go to counseling. You are more likely to quit if you go to counseling sessions regularly. Take medicine You may take medicines to help you quit. Some  medicines need a prescription, and some you can buy over-the-counter. Some medicines may contain a drug called nicotine to replace the nicotine in cigarettes. Medicines may:  Help you to stop having the desire to smoke (cravings).  Help to stop the problems that come when you stop smoking (withdrawal symptoms). Your doctor may ask you to use:  Nicotine patches, gum, or lozenges.  Nicotine inhalers or sprays.  Non-nicotine medicine that is taken by mouth. Find resources Find resources and other ways to help you quit smoking and remain smoke-free after you quit. These resources are most helpful when you use them often. They include:  Online chats with a counselor.  Phone quitlines.  Printed self-help materials.  Support groups or group counseling.  Text messaging programs.  Mobile phone apps. Use apps on your mobile phone or tablet that can help you stick to your quit plan. There are many free apps for mobile phones and tablets as well as websites. Examples include Quit Guide from the CDC and smokefree.gov  What things can I do to make it easier to quit?   Talk to your family and friends. Ask them to support and encourage you.  Call a phone quitline (1-800-QUIT-NOW), reach out to support groups, or work with a counselor.  Ask people who smoke to not smoke around you.  Avoid places that make you want to smoke,   such as: ? Bars. ? Parties. ? Smoke-break areas at work.  Spend time with people who do not smoke.  Lower the stress in your life. Stress can make you want to smoke. Try these things to help your stress: ? Getting regular exercise. ? Doing deep-breathing exercises. ? Doing yoga. ? Meditating. ? Doing a body scan. To do this, close your eyes, focus on one area of your body at a time from head to toe. Notice which parts of your body are tense. Try to relax the muscles in those areas. How will I feel when I quit smoking? Day 1 to 3 weeks Within the first 24 hours,  you may start to have some problems that come from quitting tobacco. These problems are very bad 2-3 days after you quit, but they do not often last for more than 2-3 weeks. You may get these symptoms:  Mood swings.  Feeling restless, nervous, angry, or annoyed.  Trouble concentrating.  Dizziness.  Strong desire for high-sugar foods and nicotine.  Weight gain.  Trouble pooping (constipation).  Feeling like you may vomit (nausea).  Coughing or a sore throat.  Changes in how the medicines that you take for other issues work in your body.  Depression.  Trouble sleeping (insomnia). Week 3 and afterward After the first 2-3 weeks of quitting, you may start to notice more positive results, such as:  Better sense of smell and taste.  Less coughing and sore throat.  Slower heart rate.  Lower blood pressure.  Clearer skin.  Better breathing.  Fewer sick days. Quitting smoking can be hard. Do not give up if you fail the first time. Some people need to try a few times before they succeed. Do your best to stick to your quit plan, and talk with your doctor if you have any questions or concerns. Summary  Smoking tobacco is the leading cause of preventable death. Quitting smoking can be hard, but it is one of the best things that you can do for your health.  When you decide to quit smoking, make a plan to help you succeed.  Quit smoking right away, not slowly over a period of time.  When you start quitting, seek help from your doctor, family, or friends. This information is not intended to replace advice given to you by your health care provider. Make sure you discuss any questions you have with your health care provider. Document Revised: 07/22/2019 Document Reviewed: 01/15/2019 Elsevier Patient Education  2020 [employer]  

## 2020-10-23 NOTE — Progress Notes (Signed)
I spoke with patient and his wife today.  Unfortunately, he has disease progression and needs more work up and referral to rad onc.  I will follow up on appts.  His pain was address with Dr. Julien Nordmann.

## 2020-10-24 ENCOUNTER — Ambulatory Visit
Admission: RE | Admit: 2020-10-24 | Discharge: 2020-10-24 | Disposition: A | Discharge status: Home / Self Care | Payer: 59 | Source: Ambulatory Visit | Attending: Urology | Admitting: Urology

## 2020-10-24 DIAGNOSIS — C7951 Secondary malignant neoplasm of bone: Secondary | ICD-10-CM

## 2020-10-24 LAB — RNA QUALITATIVE: HIV 1 RNA Qualitative: 1

## 2020-10-24 LAB — HIV-1/2 AB - DIFFERENTIATION
HIV 1 Ab: NEGATIVE
HIV 2 Ab: NEGATIVE
Note: NEGATIVE

## 2020-10-24 NOTE — Progress Notes (Signed)
°  Radiation Oncology         [phone] ________________________________  Name: Trevor Zavala MRN: [id number]  Date: 09/14/2020  DOB: 02-04-1966  End of Treatment Note  Diagnosis:   54 y.o. 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  Beams/energy:   The patient was treated using stereotactic body radiotherapy according to a 3D conformal radiotherapy plan.  Volumetric arc fields were employed to deliver 6 MV X-rays.  Image guidance was performed with per fraction cone beam CT prior to treatment under personal MD supervision.  Immobilization was achieved using BodyFix Pillow.  Narrative: The patient tolerated radiation treatment relatively well and did not experience any ill side effects during treatment.  Plan: The patient has completed radiation treatment. The patient will return to radiation oncology clinic for routine followup in one month. I advised them to call or return sooner if they have any questions or concerns related to their recovery or treatment. ________________________________  Sheral Apley. Tammi Klippel, M.D.

## 2020-10-24 NOTE — Progress Notes (Signed)
Radiation Oncology         [phone] ________________________________  Post-treatment Follow up- I spoke with the patient to conduct his routine scheduled 1 month follow up visit via telephone to spare the patient unnecessary potential exposure in the healthcare setting during the current COVID-19 pandemic.  The patient was notified in advance and gave permission to proceed with this visit format.  Name: Trevor Zavala MRN: [id number]  Date: 10/24/2020  DOB: [birth year]  ZS:WFUXNATFT, [name], MD  [name], [name], MD   REFERRING PHYSICIAN: [name], [name], MD  DIAGNOSIS: 54 y.o. gentleman with painful osseous metastases from  Progressive stage IV NSCLC, squamous cell carcinoma of the left lung.  1. Stage IIIB/IV (T3, N3, M1a) non-small celllung cancer, squamous cell carcinoma of the left upper lung 2. Clinical stage IA non-small cell carcinoma of the right upper lung  3. 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  HISTORY OF PRESENT ILLNESS: Trevor Zavala is a 54 y.o. male initially seen at the request of Dr. Julien Nordmann for a new diagnosis of lung cancer on 06/22/2017.  He was diagnosed with Stage IIB/IV (T3, N3, M1a) non-small celllung cancer, squamous cell carcinomapresenting with a large left hilar mass in addition to mediastinal and left supraclavicular lymphadenopathy as well as contralateral right upper lobe nodulediagnosed in August 2018. PET on 06/10/2017 revealed hypermetabolic large left hilar mass with hypermetabolic mediastinal and left supraclavicular lymph nodes, abnormal uptake at the epiglottis and a contralateral 12 mm hypermetabolic right upper lobe nodule that is not morphologically typical for a metastasis and may reflect a synchronous primary cancer without evidence of metastatic disease within the abdomen or pelvis. On 06/11/2017, MRI of the brain showed no evidence of metastatic disease to the brain. He underwent bronchoscopy with EBUS  for biopsy on 06/12/2017 under the care of Dr. Servando Snare which revealed squamous cell carcinoma in the right lower lobe and the left upper lobe of the lung. He was also evaluated with Dr. Melony Overly, ENT for evaluation of the abnormal PET scan uptake in the epiglottic area. Dr. Lucia Gaskins confirmed that on clinical fiberoptic laryngoscopy, the patient appeard to have an early stage supraglottic cancer on the laryngeal surface that was relatively small and subtle. The patient  proceeded with direct laryngoscopy and biopsies on 06/25/20 with final pathology confirming T1 N0 squamous cell carcinoma of the supraglottic larynx   He completed concurrent chemoradiation, with radiotherapy directed to the lung and larynx and chemotherapy consisting of  6 cycles of carboplatin and paclitaxel. He was later started on Keytruda, on 09/17/2017, of which he completed 35 cycles. Since completion of the Keytruda on 09/01/2019, he has remained under observation with Dr. Julien Nordmann.  A restaging CT C/A/P and neck on 07/13/2020 showed an overall excellent, stable response to treatment, with the exception of a new lytic lesion in the anterior left 5th rib. At the time of imaging, the lesion was not painful, so the patient and Dr. Julien Nordmann opted for continued surveillance. However, the patient contacted Dr. Worthy Flank office on 07/30/2020 with complaints of progressive left-sided chest/rib pain which was no longer well controlled with ibuprofen/Alleve. We met with the patient on 08/24/20 to discuss SBRT to the oligometastatic lesion in the rib which he agreed to proceed with and was completed on 09/14/20.  He tolerated treatment very well and had an excellent response with resolution of the left chest wall/rib pain.  He was recently admitted to [facility] long hospital due to significant fatigue,  weakness and imbalance and on admission was found to be hypercalcemic and hypokalemic.  He had restaging CT C/A/P on admission which showed disease  progression with a new 2.4 cm mass adjacent to the left kidney, new since imaging in September 2021.  Also noted were new osseous metastases in the right scapula, right posterior fourth rib, medial right posterior fifth rib, right posterior seventh rib, L5 vertebral body, bilateral iliac bones and the left acetabulum.  He also had a CT of the head on admission which showed lytic skull mass in the left frontal bone measuring approximately 4.2 cm with underlying dural thickening and an overlying soft tissue mass of the scalp, a 3.3 cm lesion in the left posterior vertex and left anterior temporal bone as well as right cervical nodes.  He was discharged home on 10/21/2020 and had a recent follow-up visit with Dr. Earlie Server on 10/23/2020 where they discussed the disease progression and the need for further disease staging with a PET scan and brain MRI which are scheduled for 11/06/2020 in hopes of identifying a lesion amicable for future biopsy to help guide future systemic treatment options.  He has been having progressive, severe low back pain and left hip pain which are not well controlled with narcotic pain medications at this point.  He reports only very mild, intermittent pain associated with the scalp mass at the left frontal bone and occasional blurred vision.  He denies any significant headaches and reports that the imbalance has improved since discharge home from the hospital.   PREVIOUS RADIATION THERAPY: Yes Curative concurrent chemoradiation 06/30/2017 to 08/18/2017: 1. The lung and larynx were treated to 44 Gy in 22 fractions of 2 Gy. 2. The targets were boosted to 22 Gy in 11 fractions of 2 Gy.  09/04/20 - 09/14/20: Curative SBRT// The left 5th rib target was treated to 40 Gy in 5 fractions of 8 Gy each.  PAST MEDICAL HISTORY:  Past Medical History:  Diagnosis Date  . Anxiety   . COPD (chronic obstructive pulmonary disease) ([place])   . GERD (gastroesophageal reflux disease)   . Headache    . Hypertension   . Pneumonia   . Situational depression   . Stage III squamous cell carcinoma of left lung ([place]) 06/19/2017   Lungs & Epiglottis  . Tubular adenoma of colon 01/2015  . Tubular adenoma of colon 01/2015      PAST SURGICAL HISTORY: Past Surgical History:  Procedure Laterality Date  . COLONOSCOPY    . DIRECT LARYNGOSCOPY N/A 06/25/2017   Procedure: DIRECT LARYNGOSCOPY AND BIOPSY;  Surgeon: Rozetta Nunnery, MD;  Location: [place];  Service: ENT;  Laterality: N/A;  . EAR CYST EXCISION N/A 11/17/2013   Procedure: SEBACEOUS CYST CHEST;  Surgeon: Joyice Faster. Cornett, MD;  Location: [place];  Service: General;  Laterality: N/A;  . IR GASTROSTOMY TUBE MOD SED  08/10/2017  . IR GASTROSTOMY TUBE REMOVAL  09/29/2017  . KNEE ARTHROSCOPY     LEFT  . LIPOMA EXCISION N/A 11/17/2013   Procedure: EXCISION LIPOMA FOREHEAD;  Surgeon: Joyice Faster. Cornett, MD;  Location: [place];  Service: General;  Laterality: N/A;  . LUNG BIOPSY Bilateral 06/12/2017   Procedure: LEFT LUNG BIOPSY;  Surgeon: Grace Isaac, MD;  Location: [place];  Service: Thoracic;  Laterality: Bilateral;  . MICROLARYNGOSCOPY W/VOCAL CORD INJECTION Left 07/16/2018   Procedure: suspened micro laryngoscopy with jet ventilation and prolaryn injection;  Surgeon: Melida Quitter, MD;  Location:  [facility];  Service: ENT;  Laterality: Left;  . PORTACATH PLACEMENT Right 07/01/2017   Procedure: INSERTION PORT-A-CATH - RIGHT IJ - placed with Fluoro and Ultrasound;  Surgeon: Grace Isaac, MD;  Location: [place];  Service: Thoracic;  Laterality: Right;  Marland Kitchen VIDEO BRONCHOSCOPY WITH ENDOBRONCHIAL ULTRASOUND N/A 06/12/2017   Procedure: VIDEO BRONCHOSCOPY WITH ENDOBRONCHIAL ULTRASOUND;  Surgeon: Grace Isaac, MD;  Location: [place];  Service: Thoracic;  Laterality: N/A;    FAMILY HISTORY:  Family History  Problem Relation Age of Onset  . Breast cancer Mother   . Hypertension Mother   .  Colon cancer Mother   . Skin cancer Father   . Hypertension Father   . Colon polyps Father   . Cancer Father 33       b cell lymphoma  . Colon cancer Father   . Rectal cancer Neg Hx     SOCIAL HISTORY:  Social History   Socioeconomic History  . Marital status: Married    Spouse name: Not on file  . Number of children: Not on file  . Years of education: Not on file  . Highest education level: Not on file  Occupational History  . Not on file  Tobacco Use  . Smoking status: Former Smoker    Packs/day: 0.25    Years: 35.00    Pack years: 8.75    Quit date: 08/10/2017    Years since quitting: 3.2  . Smokeless tobacco: Never Used  . Tobacco comment: down to 4 cig/day01/08/2018   Vaping Use  . Vaping Use: Former  Substance and Sexual Activity  . Alcohol use: Yes    Alcohol/week: 0.0 standard drinks    Comment: SOCIAL  . Drug use: No  . Sexual activity: Yes  Other Topics Concern  . Not on file  Social History Narrative   Daily Caffeine Use:  3   Social Determinants of Health   Financial Resource Strain: Not on file  Food Insecurity: Not on file  Transportation Needs: Not on file  Physical Activity: Not on file  Stress: Not on file  Social Connections: Not on file  Intimate Partner Violence: Not on file    ALLERGIES: Ativan [lorazepam] and Escitalopram oxalate  MEDICATIONS:  Current Outpatient Medications  Medication Sig Dispense Refill  . clonazePAM (KLONOPIN) 0.5 MG tablet TAKE 1 OR 2 TABLETS BY MOUTH TWICE A DAY AS NEEDED FOR ANXIETY (Patient taking differently: Take 0.25 mg by mouth daily.) 180 tablet 0  . esomeprazole (NEXIUM) 40 MG capsule TAKE 1 CAPSULE BY MOUTH EVERY DAY IN THE MORNING (Patient taking differently: Take 40 mg by mouth daily.) 90 capsule 3  . fluticasone furoate-vilanterol (BREO ELLIPTA) 100-25 MCG/INH AEPB Inhale 1 puff into the lungs daily. Annual appt is overdue must see provider for future refills (Patient taking differently: Inhale 1 puff  into the lungs daily.) 60 each 0  . folic acid (FOLVITE) 1 MG tablet Take 1 mg by mouth daily.    Marland Kitchen acyclovir (ZOVIRAX) 400 MG tablet Take 1 tablet (400 mg total) by mouth 3 (three) times daily. (Patient not taking: No sig reported) 21 tablet 3  . docusate sodium (COLACE) 100 MG capsule Take 200 mg by mouth daily.    . fentaNYL (DURAGESIC) 25 MCG/HR Place 1 patch onto the skin every 3 (three) days for 15 days. 5 patch 0  . Multiple Vitamin (MULTIVITAMIN WITH MINERALS) TABS tablet Take 1 tablet by mouth daily.    . Oxycodone HCl 10 MG  TABS Take 1 tablet (10 mg total) by mouth every 4 (four) hours as needed. 28 tablet 0   No current facility-administered medications for this encounter.    REVIEW OF SYSTEMS:  On review of systems, the patient reports that he is doing relatively well in general. He denies fevers, chills, night sweats, and unintended weight changes. He denies any bowel or bladder disturbances, and denies abdominal pain, nausea or vomiting.  He has not had any recent increased productive cough, shortness of breath, chest pain or hemoptysis.  He reports resolution of the left chest wall/rib pain, but has since developed progressive low back pain and left hip pain that is no longer well controlled with narcotic pain medication.  He denies any paresthesias or focal weakness in the lower extremities and no loss of control of the bowel or bladder.  A complete review of systems is obtained and is otherwise negative.  PHYSICAL EXAM:  Wt Readings from Last 3 Encounters:  10/23/20 140 lb (63.5 kg)  10/19/20 133 lb (60.3 kg)  10/19/20 134 lb 1.6 oz (60.8 kg)   Temp Readings from Last 3 Encounters:  10/23/20 98.2 F (36.8 C) (Tympanic)  10/21/20 98.8 F (37.1 C) (Oral)  10/19/20 98.1 F (36.7 C) (Tympanic)   BP Readings from Last 3 Encounters:  10/23/20 116/76  10/21/20 132/87  10/19/20 135/76   Pulse Readings from Last 3 Encounters:  10/23/20 85  10/21/20 79  10/19/20 84     /Unable to assess due to telephone follow-up visit format.  KPS = 90  100 - Normal; no complaints; no evidence of disease. 90   - Able to carry on normal activity; minor signs or symptoms of disease. 80   - Normal activity with effort; some signs or symptoms of disease. 24   - Cares for self; unable to carry on normal activity or to do active work. 60   - Requires occasional assistance, but is able to care for most of his personal needs. 50   - Requires considerable assistance and frequent medical care. 46   - Disabled; requires special care and assistance. 65   - Severely disabled; hospital admission is indicated although death not imminent. 56   - Very sick; hospital admission necessary; active supportive treatment necessary. 10   - Moribund; fatal processes progressing rapidly. 0     - Dead  Karnofsky DA, Abelmann [place], Craver LS and Burchenal [facility] [phone]) The use of the nitrogen mustards in the palliative treatment of carcinoma: with particular reference to bronchogenic carcinoma Cancer 1 [id number]  LABORATORY DATA:  Lab Results  Component Value Date   WBC 6.4 10/23/2020   HGB 11.3 (L) 10/23/2020   HCT 34.3 (L) 10/23/2020   MCV 95.3 10/23/2020   PLT 328 10/23/2020   Lab Results  Component Value Date   NA 141 10/23/2020   K 3.3 (L) 10/23/2020   CL 103 10/23/2020   CO2 30 10/23/2020   Lab Results  Component Value Date   ALT 52 (H) 10/23/2020   AST 16 10/23/2020   ALKPHOS 259 (H) 10/23/2020   BILITOT 0.4 10/23/2020     RADIOGRAPHY: DG Chest 2 View  Result Date: 10/19/2020 CLINICAL DATA:  Rib pain.  History of bone metastasis. EXAM: CHEST - 2 VIEW COMPARISON:  Chest x-ray 07/30/2020.  CT 07/13/2020, 03/02/2020. FINDINGS: PowerPort catheter noted with tip over the right atrium. Heart size normal. Stable left perihilar and left mid lung densities again noted, most consistent with scarring. Stable nodular  opacities left upper lung. No acute infiltrate. Mild left base pleural  thickening. No prominent pleural effusion. No pneumothorax. No acute bony abnormality identified. Degenerative change thoracic spine. IMPRESSION: 1. PowerPort catheter noted with tip over right atrium. 2. Stable left perihilar and left mid lung densities again noted, most consistent with scarring. Stable nodular opacities left upper lung. No acute infiltrate. Electronically Signed   By: Marcello Moores  Register   On: 10/19/2020 17:03   DG Lumbar Spine Complete  Result Date: 10/19/2020 CLINICAL DATA:  Abnormal lab draw severe left hip pain back pain EXAM: LUMBAR SPINE - COMPLETE 4+ VIEW COMPARISON:  CT 05/11/2020 FINDINGS: Lumbar alignment within normal limits. Vertebral body heights are maintained. Mild degenerative changes at multiple levels. Mild facet degenerative changes of the lower lumbar spine. Aortic atherosclerosis IMPRESSION: Mild degenerative change. Electronically Signed   By: Donavan Foil M.D.   On: 10/19/2020 17:01   CT HEAD WO CONTRAST  Result Date: 10/19/2020 CLINICAL DATA:  54 year old male with non-small cell lung cancer undergoing treatment. Dizziness. Fatigue and weakness. EXAM: CT HEAD WITHOUT CONTRAST TECHNIQUE: Contiguous axial images were obtained from the base of the skull through the vertex without intravenous contrast. COMPARISON:  Neck CT 07/13/2020. PET-CT 08/29/2019. Brain MRI 06/12/2017. FINDINGS: Brain: Abnormal malignant dural thickening along the left frontal convexity (series 2, image 20) associated with overlying lytic skull metastasis. Similar dural thickening also possible along the left posterior vertex, and at the left anterior temporal lobe related to skull metastases there. Questionable mild cerebral edema in the left anterior temporal tip (series 2, image 8). No intracranial mass effect, midline shift. No ventriculomegaly. No acute intracranial hemorrhage identified. No cortically based acute infarct identified. Chronic partially empty sella. Vascular: No suspicious  intracranial vascular hyperdensity. Skull: Multiple relatively large lytic skull metastases. Along the left anterior frontal bone there is a roughly 4.2 cm diameter lytic metastasis which appears partially necrotic (series 2, image 22) and is associated with both underlying dural thickening (image 20) and overlying scalp soft tissue mass. Smaller 3.3 cm lytic metastasis along the posterior vertex at the confluence of the lambdoid sutures just to the left of midline. Elongated or 2 adjacent lytic metastases at the left middle cranial fossa including the left greater sphenoid wing and along the left lateral coronal suture (coronal image 28). Note that the sphenoid wing lesion also has eroded the superolateral wall of the left orbit (series 5, image 24) but does not bulge into the orbit at this time. Sinuses/Orbits: Previous left mastoidectomy. Visualized paranasal sinuses and mastoids are stable and well pneumatized. Other: Abnormal scalp soft tissue thickening and heterogeneity overlying the lytic skull metastases (most notably left superior forehead series 2, image 22). Orbits soft tissues appear to remain normal. IMPRESSION: 1. Multiple relatively large lytic skull metastases, including: - dominant left anterior frontal bone metastasis which appears partially necrotic and extends into both the overlying scalp and underlying dura, and - left middle cranial fossa skull met which has eroded a portion of the left orbital wall. 2. Several areas of associated metastatic dural thickening suspected. Possible associated mild vasogenic edema at the left temporal lobe tip. 3. But no intracranial mass effect or midline shift. Electronically Signed   By: Genevie Ann M.D.   On: 10/19/2020 19:35   CT CHEST W CONTRAST  Addendum Date: 10/20/2020   ADDENDUM REPORT: 10/20/2020 21:10 ADDENDUM: There is a new soft tissue nodule adjacent to the left kidney measuring approximately 2.4 cm. This is consistent with a new metastatic  lesion.  Electronically Signed   By: Constance Holster M.D.   On: 10/20/2020 21:10   Result Date: 10/20/2020 CLINICAL DATA:  Metastatic disease.  Small cell lung cancer. EXAM: CT CHEST, ABDOMEN, AND PELVIS WITH CONTRAST TECHNIQUE: Multidetector CT imaging of the chest, abdomen and pelvis was performed following the standard protocol during bolus administration of intravenous contrast. CONTRAST:  159m OMNIPAQUE IOHEXOL 300 MG/ML  SOLN COMPARISON:  CT dated 07/13/2020 FINDINGS: CT CHEST FINDINGS Cardiovascular: The heart size is unremarkable. There is a well-positioned right-sided Port-A-Cath. Minimal atherosclerotic changes are noted of the thoracic aorta without evidence for dissection or aneurysm. There is no large centrally located pulmonary embolism Mediastinum/Nodes: -- No mediastinal lymphadenopathy. --there is a persistently enlarged right hilar lymph node (axial series 2, image 30). This is relatively stable when compared to prior study. -- No axillary lymphadenopathy. -- No supraclavicular lymphadenopathy. -- Normal thyroid gland where visualized. -  Unremarkable esophagus. Lungs/Pleura: There are relatively stable post treatment changes of the left upper lobe. There is persistent soft tissue within the left hilum and surrounding the left mainstem bronchus. Overall the volume of soft tissue in the left hilum has slightly increased from prior study. There is bronchial wall thickening and mucus plugging involving the left lower lobe. There is a small left-sided pleural effusion, new since prior study. There is some debris within the left mainstem bronchus. There are unchanged pulmonary nodules in the left upper lobe. Musculoskeletal: New lytic lesions are noted involving the right scapula, right posterior fourth rib, the medial right posterior fifth rib, the medial posterior right seventh rib. Again noted is a lytic lesion involving the anterior left fifth rib. Additional lytic lesions are suspected for example in  the left glenoid (axial series 4, image 71). CT ABDOMEN PELVIS FINDINGS Hepatobiliary: The liver is normal. There is a very abnormal appearance of the gallbladder with apparent gallbladder wall thickening and intraluminal debris or tumefactive sludge. This is new since the patient's prior CT. There is suggestion of sludge or small stones in the distal common bile duct (axial series 2, image 75).There is mild dilatation of the common bile duct measuring up to approximately 7 mm. Pancreas: Normal contours without ductal dilatation. No peripancreatic fluid collection. Spleen: Unremarkable. Adrenals/Urinary Tract: --Adrenal glands: There is an unchanged left adrenal nodule measuring approximately 1.3 cm. --Right kidney/ureter: No hydronephrosis or radiopaque kidney stones. --Left kidney/ureter: No hydronephrosis or radiopaque kidney stones. --Urinary bladder: Unremarkable. Stomach/Bowel: --Stomach/Duodenum: No hiatal hernia or other gastric abnormality. Normal duodenal course and caliber. --Small bowel: Unremarkable. --Colon: Unremarkable. --Appendix: Normal. Vascular/Lymphatic: Atherosclerotic calcification is present within the non-aneurysmal abdominal aorta, without hemodynamically significant stenosis. --No retroperitoneal lymphadenopathy. --No mesenteric lymphadenopathy. --No pelvic or inguinal lymphadenopathy. Reproductive: Unremarkable Other: No ascites or free air. The abdominal wall is normal. Musculoskeletal. There is a new lytic lesion in the inferior aspect of the L5 vertebral body. There are lytic lesions involving the bilateral iliac bones that are new from prior study (axial series 2, image 106). There is a new lytic lesion in the left acetabulum (axial series 2, image 116). IMPRESSION: 1. Findings consistent with progressive metastatic disease as evidence by multiple new lytic osseous lesions involving the thorax, spine, and pelvis. 2. Slight interval increase in soft tissue at the left hilum. While this  may represent post treatment changes, recurrent tumor is not excluded. 3. There is a trace left-sided pleural effusion. 4. Very abnormal appearance of the gallbladder with diffuse gallbladder wall thickening and hyperenhancement with intraluminal debris or  sludge. Follow-up with ultrasound is recommended. 5. Mild dilatation of the common bile duct with probable filling defects within the distal common bile duct. Correlation with MRCP/ERCP is recommended. Electronically Signed: By: Constance Holster M.D. On: 10/19/2020 19:22   CT ABDOMEN PELVIS W CONTRAST  Addendum Date: 10/20/2020   ADDENDUM REPORT: 10/20/2020 21:10 ADDENDUM: There is a new soft tissue nodule adjacent to the left kidney measuring approximately 2.4 cm. This is consistent with a new metastatic lesion. Electronically Signed   By: Constance Holster M.D.   On: 10/20/2020 21:10   Result Date: 10/20/2020 CLINICAL DATA:  Metastatic disease.  Small cell lung cancer. EXAM: CT CHEST, ABDOMEN, AND PELVIS WITH CONTRAST TECHNIQUE: Multidetector CT imaging of the chest, abdomen and pelvis was performed following the standard protocol during bolus administration of intravenous contrast. CONTRAST:  138m OMNIPAQUE IOHEXOL 300 MG/ML  SOLN COMPARISON:  CT dated 07/13/2020 FINDINGS: CT CHEST FINDINGS Cardiovascular: The heart size is unremarkable. There is a well-positioned right-sided Port-A-Cath. Minimal atherosclerotic changes are noted of the thoracic aorta without evidence for dissection or aneurysm. There is no large centrally located pulmonary embolism Mediastinum/Nodes: -- No mediastinal lymphadenopathy. --there is a persistently enlarged right hilar lymph node (axial series 2, image 30). This is relatively stable when compared to prior study. -- No axillary lymphadenopathy. -- No supraclavicular lymphadenopathy. -- Normal thyroid gland where visualized. -  Unremarkable esophagus. Lungs/Pleura: There are relatively stable post treatment changes of the  left upper lobe. There is persistent soft tissue within the left hilum and surrounding the left mainstem bronchus. Overall the volume of soft tissue in the left hilum has slightly increased from prior study. There is bronchial wall thickening and mucus plugging involving the left lower lobe. There is a small left-sided pleural effusion, new since prior study. There is some debris within the left mainstem bronchus. There are unchanged pulmonary nodules in the left upper lobe. Musculoskeletal: New lytic lesions are noted involving the right scapula, right posterior fourth rib, the medial right posterior fifth rib, the medial posterior right seventh rib. Again noted is a lytic lesion involving the anterior left fifth rib. Additional lytic lesions are suspected for example in the left glenoid (axial series 4, image 71). CT ABDOMEN PELVIS FINDINGS Hepatobiliary: The liver is normal. There is a very abnormal appearance of the gallbladder with apparent gallbladder wall thickening and intraluminal debris or tumefactive sludge. This is new since the patient's prior CT. There is suggestion of sludge or small stones in the distal common bile duct (axial series 2, image 75).There is mild dilatation of the common bile duct measuring up to approximately 7 mm. Pancreas: Normal contours without ductal dilatation. No peripancreatic fluid collection. Spleen: Unremarkable. Adrenals/Urinary Tract: --Adrenal glands: There is an unchanged left adrenal nodule measuring approximately 1.3 cm. --Right kidney/ureter: No hydronephrosis or radiopaque kidney stones. --Left kidney/ureter: No hydronephrosis or radiopaque kidney stones. --Urinary bladder: Unremarkable. Stomach/Bowel: --Stomach/Duodenum: No hiatal hernia or other gastric abnormality. Normal duodenal course and caliber. --Small bowel: Unremarkable. --Colon: Unremarkable. --Appendix: Normal. Vascular/Lymphatic: Atherosclerotic calcification is present within the non-aneurysmal  abdominal aorta, without hemodynamically significant stenosis. --No retroperitoneal lymphadenopathy. --No mesenteric lymphadenopathy. --No pelvic or inguinal lymphadenopathy. Reproductive: Unremarkable Other: No ascites or free air. The abdominal wall is normal. Musculoskeletal. There is a new lytic lesion in the inferior aspect of the L5 vertebral body. There are lytic lesions involving the bilateral iliac bones that are new from prior study (axial series 2, image 106). There is a new lytic lesion in  the left acetabulum (axial series 2, image 116). IMPRESSION: 1. Findings consistent with progressive metastatic disease as evidence by multiple new lytic osseous lesions involving the thorax, spine, and pelvis. 2. Slight interval increase in soft tissue at the left hilum. While this may represent post treatment changes, recurrent tumor is not excluded. 3. There is a trace left-sided pleural effusion. 4. Very abnormal appearance of the gallbladder with diffuse gallbladder wall thickening and hyperenhancement with intraluminal debris or sludge. Follow-up with ultrasound is recommended. 5. Mild dilatation of the common bile duct with probable filling defects within the distal common bile duct. Correlation with MRCP/ERCP is recommended. Electronically Signed: By: Constance Holster M.D. On: 10/19/2020 19:22   MR 3D Recon At Scanner  Result Date: 10/20/2020 CLINICAL DATA:  Cholelithiasis.  Enhancing gallbladder wall on CT. EXAM: MRI ABDOMEN WITHOUT AND WITH CONTRAST (INCLUDING MRCP) TECHNIQUE: Multiplanar multisequence MR imaging of the abdomen was performed both before and after the administration of intravenous contrast. Heavily T2-weighted images of the biliary and pancreatic ducts were obtained, and three-dimensional MRCP images were rendered by post processing. CONTRAST:  75m GADAVIST GADOBUTROL 1 MMOL/ML IV SOLN COMPARISON:  CT 10/19/2020, 07/13/2020 PET-CT 08/29/2019 FINDINGS: Lower chest: Lung bases are clear.  Hepatobiliary: No focal hepatic lesion. There is gallbladder wall thickening involving the fundus of the gallbladder best seen on image 15/3 with thickening of single wall up to 10 mm. On postcontrast imaging there is no apparent enhancement of this of thickened gallbladder fundus. There is no intrahepatic biliary duct dilatation. The common bile duct normal caliber. No filling defect within the common bile duct. Pancreas: Pancreas is normal. No ductal dilatation. No pancreatic inflammation. Spleen: Normal spleen Adrenals/urinary tract: Adrenal glands normal. Nonenhancing cysts of the RIGHT kidney. Medial to the LEFT kidney there is an oblong lesion which is hypointense on T2 weighted imaging measuring 16 x 8 mm (image 20/15). There is mild enhancement of this lesion (image 47/series 24.) Stomach/Bowel: Stomach and limited view of the small bowel is unremarkable. Limited view of the colon is unremarkable Vascular/Lymphatic: Abdominal aorta normal caliber. No upper abdominal adenopathy. Other: No free fluid. Musculoskeletal: Enhancing lesion the posterior LEFT rib (image 20/24) IMPRESSION: 1. No evidence of choledocholithiasis.  No biliary obstruction. 2. Circumferential thickening of the gallbladder fundus without enhancement. Nonspecific finding. Favor chronic inflammation. Cannot exclude gallbladder metastasis however non enhancement would weigh against metastasis. 3. New retroperitoneal nodule medial to the LEFT kidney is concerning for lung cancer metastasis. Consider nonemergent outpatient FDG PET scan for further characterization (gallbladder could be assessed at same time). 4. 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 Electronically Signed   By: SSuzy BouchardM.D.   On: 10/20/2020 09:42   MR ABDOMEN MRCP W WO CONTAST  Result Date: 10/20/2020 CLINICAL DATA:  Cholelithiasis.  Enhancing gallbladder wall on CT. EXAM: MRI ABDOMEN WITHOUT AND WITH  CONTRAST (INCLUDING MRCP) TECHNIQUE: Multiplanar multisequence MR imaging of the abdomen was performed both before and after the administration of intravenous contrast. Heavily T2-weighted images of the biliary and pancreatic ducts were obtained, and three-dimensional MRCP images were rendered by post processing. CONTRAST:  634mGADAVIST GADOBUTROL 1 MMOL/ML IV SOLN COMPARISON:  CT 10/19/2020, 07/13/2020 PET-CT 08/29/2019 FINDINGS: Lower chest: Lung bases are clear. Hepatobiliary: No focal hepatic lesion. There is gallbladder wall thickening involving the fundus of the gallbladder best seen on image 15/3 with thickening of single wall up to 10 mm. On postcontrast imaging there is no  apparent enhancement of this of thickened gallbladder fundus. There is no intrahepatic biliary duct dilatation. The common bile duct normal caliber. No filling defect within the common bile duct. Pancreas: Pancreas is normal. No ductal dilatation. No pancreatic inflammation. Spleen: Normal spleen Adrenals/urinary tract: Adrenal glands normal. Nonenhancing cysts of the RIGHT kidney. Medial to the LEFT kidney there is an oblong lesion which is hypointense on T2 weighted imaging measuring 16 x 8 mm (image 20/15). There is mild enhancement of this lesion (image 47/series 24.) Stomach/Bowel: Stomach and limited view of the small bowel is unremarkable. Limited view of the colon is unremarkable Vascular/Lymphatic: Abdominal aorta normal caliber. No upper abdominal adenopathy. Other: No free fluid. Musculoskeletal: Enhancing lesion the posterior LEFT rib (image 20/24) IMPRESSION: 1. No evidence of choledocholithiasis.  No biliary obstruction. 2. Circumferential thickening of the gallbladder fundus without enhancement. Nonspecific finding. Favor chronic inflammation. Cannot exclude gallbladder metastasis however non enhancement would weigh against metastasis. 3. New retroperitoneal nodule medial to the LEFT kidney is concerning for lung cancer  metastasis. Consider nonemergent outpatient FDG PET scan for further characterization (gallbladder could be assessed at same time). 4. 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 Electronically Signed   By: Suzy Bouchard M.D.   On: 10/20/2020 09:42   DG Hip Unilat W or Wo Pelvis 2-3 Views Left  Result Date: 10/19/2020 CLINICAL DATA:  Severe hip pain EXAM: DG HIP (WITH OR WITHOUT PELVIS) 2-3V LEFT COMPARISON:  CT 05/11/2020, pelvis radiograph 10/18/2020 FINDINGS: SI joints are patent. Pubic symphysis and rami appear intact. No fracture or malalignment. The joint space appears maintained IMPRESSION: Negative. Electronically Signed   By: Donavan Foil M.D.   On: 10/19/2020 17:02   US Abdomen Limited RUQ (LIVER/GB)  Result Date: 10/20/2020 CLINICAL DATA:  Elevated LFTs EXAM: ULTRASOUND ABDOMEN LIMITED RIGHT UPPER QUADRANT COMPARISON:  CT 10/19/2020 FINDINGS: Gallbladder: Contracted gallbladder with wall thickening. Similar to CT same day. Common bile duct: Diameter: Normal diameter at 5 mm Liver: Normal liver parenchyma. No duct dilatation. Portal vein is patent on color Doppler imaging with normal direction of blood flow towards the liver. Other: No free fluid IMPRESSION: 1. Contracted gallbladder with thickened wall. Findings similar to comparison CT same day. 2. Negative sonographic Murphy's sign. 3. No biliary duct dilatation. Electronically Signed   By: Suzy Bouchard M.D.   On: 10/20/2020 06:46      IMPRESSION/PLAN: This visit was conducted via telephone to spare the patient unnecessary potential exposure in the healthcare setting during the current COVID-19 pandemic 1. 54 y.o. 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 Today, I talked to the patient and family about the findings and work-up thus far which have been reviewed personally by myself and Dr. Tammi Klippel.  We discussed  the natural history of metastatic lung cancer and the general treatment, highlighting the role of palliative radiotherapy in the management of painful osseous metastatic disease.  In this setting, our recommendation is to proceed with a 10 fraction course of daily palliative external beam radation to the painful sites of disease at L5 and the left hip.  We will also give consideration to treating the left frontal/scalp lesion at the same time.  We discussed the available radiation techniques, and focused on the details and logistics of delivery which he is quite familiar with.  We reviewed the anticipated acute and late sequelae associated with radiation in this setting.  The patient was encouraged to ask questions that were answered to his stated satisfaction.   At the end of our conversation, the patient is interested in proceeding with palliative radiotherapy to the painful osseous metastases at L5, the left hip and possibly treatment of the left frontal/scalp lesion at the same time.  He has provided verbal consent to proceed and will come in on Friday, 10/26/2020 for a quick visit and exam to confirm the number and location of lesions to be treated followed by CT simulation/treatment planning.  He will sign formal written consent to proceed at the time of his simulation and a copy of this document will be placed in his chart.  We will share our discussion with Dr. Earlie Server and move forward with treatment planning accordingly.  Given current concerns for patient exposure during the COVID-19 pandemic, this encounter was conducted via telephone. The patient was notified in advance and was offered a [place] meeting to allow for face to face communication but unfortunately reported that he did not have the appropriate resources/technology to support such a visit and instead preferred to proceed with telephone follow up. The patient has given verbal consent for this type of encounter. The time spent during this  encounter was 25 minutes. The attendants for this meeting include  Amand Lemoine PA-C, and patient, Mavric Cortright.  During the encounter Nihar Klus PA-C, was located at [facility] Rad[facility]cology [facility].  Patient, Trevor Zavala was located at home.   Nicholos Johns, MMS, PA-C [place] [facility] [place]: [phone]  Fax: [phone]

## 2020-10-25 ENCOUNTER — Telehealth: Payer: Self-pay | Admitting: Internal Medicine

## 2020-10-25 ENCOUNTER — Inpatient Hospital Stay: Payer: 59

## 2020-10-25 ENCOUNTER — Telehealth: Payer: Self-pay | Admitting: Radiation Oncology

## 2020-10-25 NOTE — Telephone Encounter (Signed)
Received voicemail message from patient concerned about lab appointments scheduled by his medical oncologist, Dr. Julien Nordmann. Phoned patient back and provided him with Diamond Nickel number, RN for Dr. Julien Nordmann. Patient read the number back corrected and verbalized his intent to phone her directly.

## 2020-10-25 NOTE — Telephone Encounter (Signed)
Scheduled per los. Called and spoke with patient. Confirmed appt 

## 2020-10-26 ENCOUNTER — Ambulatory Visit
Admission: RE | Admit: 2020-10-26 | Discharge: 2020-10-26 | Disposition: A | Discharge status: Home / Self Care | Payer: 59 | Source: Ambulatory Visit | Attending: Urology | Admitting: Urology

## 2020-10-26 ENCOUNTER — Other Ambulatory Visit: Payer: Self-pay

## 2020-10-26 ENCOUNTER — Inpatient Hospital Stay: Payer: 59

## 2020-10-26 ENCOUNTER — Ambulatory Visit
Admission: RE | Admit: 2020-10-26 | Discharge: 2020-10-26 | Disposition: A | Discharge status: Home / Self Care | Payer: 59 | Source: Ambulatory Visit | Attending: Radiation Oncology | Admitting: Radiation Oncology

## 2020-10-26 ENCOUNTER — Encounter: Payer: Self-pay | Admitting: Urology

## 2020-10-26 VITALS — BP 114/62 | HR 68 | Temp 98.4°F | Resp 18 | Ht 70.0 in | Wt 140.2 lb

## 2020-10-26 DIAGNOSIS — Z79899 Other long term (current) drug therapy: Secondary | ICD-10-CM | POA: Insufficient documentation

## 2020-10-26 DIAGNOSIS — C321 Malignant neoplasm of supraglottis: Secondary | ICD-10-CM

## 2020-10-26 DIAGNOSIS — M47816 Spondylosis without myelopathy or radiculopathy, lumbar region: Secondary | ICD-10-CM | POA: Insufficient documentation

## 2020-10-26 DIAGNOSIS — I10 Essential (primary) hypertension: Secondary | ICD-10-CM | POA: Insufficient documentation

## 2020-10-26 DIAGNOSIS — K8020 Calculus of gallbladder without cholecystitis without obstruction: Secondary | ICD-10-CM | POA: Insufficient documentation

## 2020-10-26 DIAGNOSIS — C7951 Secondary malignant neoplasm of bone: Secondary | ICD-10-CM

## 2020-10-26 DIAGNOSIS — F419 Anxiety disorder, unspecified: Secondary | ICD-10-CM | POA: Insufficient documentation

## 2020-10-26 DIAGNOSIS — M47814 Spondylosis without myelopathy or radiculopathy, thoracic region: Secondary | ICD-10-CM | POA: Insufficient documentation

## 2020-10-26 DIAGNOSIS — J90 Pleural effusion, not elsewhere classified: Secondary | ICD-10-CM | POA: Insufficient documentation

## 2020-10-26 DIAGNOSIS — J449 Chronic obstructive pulmonary disease, unspecified: Secondary | ICD-10-CM | POA: Insufficient documentation

## 2020-10-26 DIAGNOSIS — Z803 Family history of malignant neoplasm of breast: Secondary | ICD-10-CM | POA: Insufficient documentation

## 2020-10-26 DIAGNOSIS — Z95828 Presence of other vascular implants and grafts: Secondary | ICD-10-CM

## 2020-10-26 DIAGNOSIS — Z800 Family history of malignant neoplasm of digestive organs: Secondary | ICD-10-CM | POA: Insufficient documentation

## 2020-10-26 DIAGNOSIS — Z923 Personal history of irradiation: Secondary | ICD-10-CM | POA: Insufficient documentation

## 2020-10-26 DIAGNOSIS — B20 Human immunodeficiency virus [HIV] disease: Secondary | ICD-10-CM

## 2020-10-26 DIAGNOSIS — Z86010 Personal history of colonic polyps: Secondary | ICD-10-CM | POA: Insufficient documentation

## 2020-10-26 DIAGNOSIS — C3412 Malignant neoplasm of upper lobe, left bronchus or lung: Secondary | ICD-10-CM | POA: Insufficient documentation

## 2020-10-26 DIAGNOSIS — Z87891 Personal history of nicotine dependence: Secondary | ICD-10-CM | POA: Insufficient documentation

## 2020-10-26 DIAGNOSIS — K219 Gastro-esophageal reflux disease without esophagitis: Secondary | ICD-10-CM | POA: Insufficient documentation

## 2020-10-26 MED ORDER — SODIUM CHLORIDE 0.9% FLUSH
10.0000 mL | Freq: Once | INTRAVENOUS | Status: AC
  Administered 2020-10-26: 10:00:00 10 mL
  Filled 2020-10-26: qty 10

## 2020-10-26 MED ORDER — HEPARIN SOD (PORK) LOCK FLUSH 100 UNIT/ML IV SOLN
500.0000 [IU] | Freq: Once | INTRAVENOUS | Status: AC
  Administered 2020-10-26: 10:00:00 500 [IU]
  Filled 2020-10-26: qty 5

## 2020-10-26 NOTE — Progress Notes (Signed)
Radiation Oncology         [phone] ________________________________  Re-Consultation Note  Name: Trevor Zavala MRN: [id number]  Date: 10/26/2020  DOB: 1966-07-01  OV:FIEPPIRJJ, [name], MD  [name], [name], MD   REFERRING PHYSICIAN: [name], [name], MD  DIAGNOSIS: 54 y.o. gentleman with painful osseous metastases secondary to progressive, stage IV NSCLC, squamous cell carcinoma of the left lung.  1. Stage IIIB/IV (T3, N3, M1a) non-small celllung cancer, squamous cell carcinoma of the left upper lung 2. Clinical stage IA non-small cell carcinoma of the right upper lung  3. 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  HISTORY OF PRESENT ILLNESS: Trevor Zavala is a 54 y.o. male initially seen at the request of Dr. Julien Nordmann for a new diagnosis of lung cancer on 06/22/2017.  He was diagnosed with Stage IIB/IV (T3, N3, M1a) non-small celllung cancer, squamous cell carcinomapresenting with a large left hilar mass in addition to mediastinal and left supraclavicular lymphadenopathy as well as contralateral right upper lobe nodulediagnosed in August 2018. PET on 06/10/2017 revealed hypermetabolic large left hilar mass with hypermetabolic mediastinal and left supraclavicular lymph nodes, abnormal uptake at the epiglottis and a contralateral 12 mm hypermetabolic right upper lobe nodule that is not morphologically typical for a metastasis and may reflect a synchronous primary cancer without evidence of metastatic disease within the abdomen or pelvis. On 06/11/2017, MRI of the brain showed no evidence of metastatic disease to the brain. He underwent bronchoscopy with EBUS for biopsy on 06/12/2017 under the care of Dr. Servando Snare which revealed squamous cell carcinoma in the right lower lobe and the left upper lobe of the lung. He was also evaluated with Dr. Melony Overly, ENT for evaluation of the abnormal PET scan uptake in the epiglottic area. Dr. Lucia Gaskins confirmed  that on clinical fiberoptic laryngoscopy, the patient appeard to have an early stage supraglottic cancer on the laryngeal surface that was relatively small and subtle. The patient  proceeded with direct laryngoscopy and biopsies on 06/25/20 with final pathology confirming T1 N0 squamous cell carcinoma of the supraglottic larynx   He completed concurrent chemoradiation, with radiotherapy directed to the lung and larynx and chemotherapy consisting of  6 cycles of carboplatin and paclitaxel. He was later started on Keytruda, on 09/17/2017, of which he completed 35 cycles. Since completion of the Keytruda on 09/01/2019, he has remained under observation with Dr. Julien Nordmann.  A restaging CT C/A/P and neck on 07/13/2020 showed an overall excellent, stable response to treatment, with the exception of a new lytic lesion in the anterior left 5th rib. At the time of imaging, the lesion was not painful, so the patient and Dr. Julien Nordmann opted for continued surveillance. However, the patient contacted Dr. Worthy Flank office on 07/30/2020 with complaints of progressive left-sided chest/rib pain which was no longer well controlled with ibuprofen/Alleve. We met with the patient on 08/24/20 to discuss SBRT to the oligometastatic lesion in the rib which he agreed to proceed with and was completed on 09/14/20.  He tolerated treatment very well and had an excellent response with resolution of the left chest wall/rib pain.  He was recently admitted to [facility] long hospital due to significant fatigue, weakness and imbalance and on admission was found to be hypercalcemic and hypokalemic.  He had restaging CT C/A/P on admission which showed disease progression with a new 2.4 cm mass adjacent to the left kidney, not present on imaging in September 2021.  Also noted were new osseous metastases  in the right scapula, right posterior fourth rib, medial right posterior fifth rib, right posterior seventh rib, L5 vertebral body, bilateral iliac bones and  the left acetabulum.  He also had a CT of the head on admission which showed a lytic skull mass in the left frontal bone measuring approximately 4.2 cm with underlying dural thickening and an overlying soft tissue mass of the scalp, as well as a 3.3 cm lesion in the left posterior vertex and left anterior temporal bone and right cervical nodes.  He was discharged home on 10/21/2020 and had a recent follow-up visit with Dr. Earlie Server on 10/23/2020 where they discussed the disease progression and the need for further disease staging with a PET scan and brain MRI which are scheduled for 11/06/2020 in hopes of identifying a lesion amicable for future biopsy to help guide future systemic treatment options.  He has been having progressive, severe low back pain and left hip pain which are not well controlled with narcotic pain medications at this point.  The pain is now radiating down the left leg with periodic numbness/tingling since 10/25/20. He denies any focal weakness in the lower extremities and has not had any changes in his bowel or bladder function. He reports only very mild, intermittent pain associated with the scalp mass at the left frontal bone and occasional blurred vision.  He denies any significant headaches and reports that the imbalance has improved since discharge home from the hospital.   PREVIOUS RADIATION THERAPY: Yes 06/30/2017 - 08/18/2017- Curative concurrent chemoradiation: 1. The lung and larynx were treated to 44 Gy in 22 fractions of 2 Gy. 2. The targets were boosted to 22 Gy in 11 fractions of 2 Gy.  09/04/20 - 09/14/20: Curative SBRT// The left 5th rib target was treated to 40 Gy in 5 fractions of 8 Gy each.  PAST MEDICAL HISTORY:  Past Medical History:  Diagnosis Date  . Anxiety   . COPD (chronic obstructive pulmonary disease) ([place])   . GERD (gastroesophageal reflux disease)   . Headache   . Hypertension   . Pneumonia   . Situational depression   . Stage III squamous  cell carcinoma of left lung ([place]) 06/19/2017   Lungs & Epiglottis  . Tubular adenoma of colon 01/2015  . Tubular adenoma of colon 01/2015      PAST SURGICAL HISTORY: Past Surgical History:  Procedure Laterality Date  . COLONOSCOPY    . DIRECT LARYNGOSCOPY N/A 06/25/2017   Procedure: DIRECT LARYNGOSCOPY AND BIOPSY;  Surgeon: Rozetta Nunnery, MD;  Location: [place];  Service: ENT;  Laterality: N/A;  . EAR CYST EXCISION N/A 11/17/2013   Procedure: SEBACEOUS CYST CHEST;  Surgeon: Joyice Faster. Cornett, MD;  Location: [place];  Service: General;  Laterality: N/A;  . IR GASTROSTOMY TUBE MOD SED  08/10/2017  . IR GASTROSTOMY TUBE REMOVAL  09/29/2017  . KNEE ARTHROSCOPY     LEFT  . LIPOMA EXCISION N/A 11/17/2013   Procedure: EXCISION LIPOMA FOREHEAD;  Surgeon: Joyice Faster. Cornett, MD;  Location: [place];  Service: General;  Laterality: N/A;  . LUNG BIOPSY Bilateral 06/12/2017   Procedure: LEFT LUNG BIOPSY;  Surgeon: Grace Isaac, MD;  Location: [place];  Service: Thoracic;  Laterality: Bilateral;  . MICROLARYNGOSCOPY W/VOCAL CORD INJECTION Left 07/16/2018   Procedure: suspened micro laryngoscopy with jet ventilation and prolaryn injection;  Surgeon: Melida Quitter, MD;  Location: [place];  Service: ENT;  Laterality: Left;  . PORTACATH PLACEMENT  Right 07/01/2017   Procedure: INSERTION PORT-A-CATH - RIGHT IJ - placed with Fluoro and Ultrasound;  Surgeon: Grace Isaac, MD;  Location: [place];  Service: Thoracic;  Laterality: Right;  Marland Kitchen VIDEO BRONCHOSCOPY WITH ENDOBRONCHIAL ULTRASOUND N/A 06/12/2017   Procedure: VIDEO BRONCHOSCOPY WITH ENDOBRONCHIAL ULTRASOUND;  Surgeon: Grace Isaac, MD;  Location: [place];  Service: Thoracic;  Laterality: N/A;    FAMILY HISTORY:  Family History  Problem Relation Age of Onset  . Breast cancer Mother   . Hypertension Mother   . Colon cancer Mother   . Skin cancer Father   . Hypertension Father   . Colon polyps  Father   . Cancer Father 47       b cell lymphoma  . Colon cancer Father   . Rectal cancer Neg Hx     SOCIAL HISTORY:  Social History   Socioeconomic History  . Marital status: Married    Spouse name: Not on file  . Number of children: Not on file  . Years of education: Not on file  . Highest education level: Not on file  Occupational History  . Not on file  Tobacco Use  . Smoking status: Former Smoker    Packs/day: 0.25    Years: 35.00    Pack years: 8.75    Quit date: 08/10/2017    Years since quitting: 3.2  . Smokeless tobacco: Never Used  . Tobacco comment: down to 4 cig/day01/08/2018   Vaping Use  . Vaping Use: Former  Substance and Sexual Activity  . Alcohol use: Yes    Alcohol/week: 0.0 standard drinks    Comment: SOCIAL  . Drug use: No  . Sexual activity: Yes  Other Topics Concern  . Not on file  Social History Narrative   Daily Caffeine Use:  3   Social Determinants of Health   Financial Resource Strain: Not on file  Food Insecurity: Not on file  Transportation Needs: Not on file  Physical Activity: Not on file  Stress: Not on file  Social Connections: Not on file  Intimate Partner Violence: Not on file    ALLERGIES: Ativan [lorazepam] and Escitalopram oxalate  MEDICATIONS:  Current Outpatient Medications  Medication Sig Dispense Refill  . acyclovir (ZOVIRAX) 400 MG tablet Take 1 tablet (400 mg total) by mouth 3 (three) times daily. (Patient not taking: No sig reported) 21 tablet 3  . clonazePAM (KLONOPIN) 0.5 MG tablet TAKE 1 OR 2 TABLETS BY MOUTH TWICE A DAY AS NEEDED FOR ANXIETY (Patient taking differently: Take 0.25 mg by mouth daily.) 180 tablet 0  . docusate sodium (COLACE) 100 MG capsule Take 200 mg by mouth daily.    Marland Kitchen esomeprazole (NEXIUM) 40 MG capsule TAKE 1 CAPSULE BY MOUTH EVERY DAY IN THE MORNING (Patient taking differently: Take 40 mg by mouth daily.) 90 capsule 3  . fentaNYL (DURAGESIC) 25 MCG/HR Place 1 patch onto the skin every 3  (three) days for 15 days. 5 patch 0  . fluticasone furoate-vilanterol (BREO ELLIPTA) 100-25 MCG/INH AEPB Inhale 1 puff into the lungs daily. Annual appt is overdue must see provider for future refills (Patient taking differently: Inhale 1 puff into the lungs daily.) 60 each 0  . folic acid (FOLVITE) 1 MG tablet Take 1 mg by mouth daily.    . Multiple Vitamin (MULTIVITAMIN WITH MINERALS) TABS tablet Take 1 tablet by mouth daily.    . Oxycodone HCl 10 MG TABS Take 1 tablet (10 mg total) by mouth every 4 (four)  hours as needed. 28 tablet 0   No current facility-administered medications for this encounter.    REVIEW OF SYSTEMS:  On review of systems, the patient reports that he is doing relatively well in general. He denies fevers, chills, night sweats, and unintended weight changes. He denies any bowel or bladder disturbances, and denies abdominal pain, nausea or vomiting.  He has not had any recent increased productive cough, shortness of breath, chest pain or hemoptysis.  He reports resolution of the left chest wall/rib pain, but has since developed progressive low back pain and left hip pain that is no longer well controlled with narcotic pain medication.  He denies any paresthesias or focal weakness in the lower extremities and no loss of control of the bowel or bladder.  A complete review of systems is obtained and is otherwise negative.  PHYSICAL EXAM:  Wt Readings from Last 3 Encounters:  10/23/20 140 lb (63.5 kg)  10/19/20 133 lb (60.3 kg)  10/19/20 134 lb 1.6 oz (60.8 kg)   Temp Readings from Last 3 Encounters:  10/23/20 98.2 F (36.8 C) (Tympanic)  10/21/20 98.8 F (37.1 C) (Oral)  10/19/20 98.1 F (36.7 C) (Tympanic)   BP Readings from Last 3 Encounters:  10/23/20 116/76  10/21/20 132/87  10/19/20 135/76   Pulse Readings from Last 3 Encounters:  10/23/20 85  10/21/20 79  10/19/20 84   In general this is a well appearing Caucasian male in no acute distress. He's alert and  oriented x4 and appropriate throughout the examination. There is a golf-ball size palpable scalp mass just left of midline in the frontal region that is mildly tender to palpation. There is no redness or edema noted and no disruption of the overlying skin. Cardiopulmonary assessment is negative for acute distress and he exhibits normal effort.     KPS = 80  100 - Normal; no complaints; no evidence of disease. 90   - Able to carry on normal activity; minor signs or symptoms of disease. 80   - Normal activity with effort; some signs or symptoms of disease. 41   - Cares for self; unable to carry on normal activity or to do active work. 60   - Requires occasional assistance, but is able to care for most of his personal needs. 50   - Requires considerable assistance and frequent medical care. 32   - Disabled; requires special care and assistance. 72   - Severely disabled; hospital admission is indicated although death not imminent. 24   - Very sick; hospital admission necessary; active supportive treatment necessary. 10   - Moribund; fatal processes progressing rapidly. 0     - Dead  Karnofsky DA, Abelmann [place], Craver LS and Burchenal [facility] [phone]) The use of the nitrogen mustards in the palliative treatment of carcinoma: with particular reference to bronchogenic carcinoma Cancer 1 [id number]  LABORATORY DATA:  Lab Results  Component Value Date   WBC 6.4 10/23/2020   HGB 11.3 (L) 10/23/2020   HCT 34.3 (L) 10/23/2020   MCV 95.3 10/23/2020   PLT 328 10/23/2020   Lab Results  Component Value Date   NA 141 10/23/2020   K 3.3 (L) 10/23/2020   CL 103 10/23/2020   CO2 30 10/23/2020   Lab Results  Component Value Date   ALT 52 (H) 10/23/2020   AST 16 10/23/2020   ALKPHOS 259 (H) 10/23/2020   BILITOT 0.4 10/23/2020     RADIOGRAPHY: DG Chest 2 View  Result Date: 10/19/2020 CLINICAL  DATA:  Rib pain.  History of bone metastasis. EXAM: CHEST - 2 VIEW COMPARISON:  Chest x-ray 07/30/2020.  CT  07/13/2020, 03/02/2020. FINDINGS: PowerPort catheter noted with tip over the right atrium. Heart size normal. Stable left perihilar and left mid lung densities again noted, most consistent with scarring. Stable nodular opacities left upper lung. No acute infiltrate. Mild left base pleural thickening. No prominent pleural effusion. No pneumothorax. No acute bony abnormality identified. Degenerative change thoracic spine. IMPRESSION: 1. PowerPort catheter noted with tip over right atrium. 2. Stable left perihilar and left mid lung densities again noted, most consistent with scarring. Stable nodular opacities left upper lung. No acute infiltrate. Electronically Signed   By: Marcello Moores  Register   On: 10/19/2020 17:03   DG Lumbar Spine Complete  Result Date: 10/19/2020 CLINICAL DATA:  Abnormal lab draw severe left hip pain back pain EXAM: LUMBAR SPINE - COMPLETE 4+ VIEW COMPARISON:  CT 05/11/2020 FINDINGS: Lumbar alignment within normal limits. Vertebral body heights are maintained. Mild degenerative changes at multiple levels. Mild facet degenerative changes of the lower lumbar spine. Aortic atherosclerosis IMPRESSION: Mild degenerative change. Electronically Signed   By: Donavan Foil M.D.   On: 10/19/2020 17:01   CT HEAD WO CONTRAST  Result Date: 10/19/2020 CLINICAL DATA:  54 year old male with non-small cell lung cancer undergoing treatment. Dizziness. Fatigue and weakness. EXAM: CT HEAD WITHOUT CONTRAST TECHNIQUE: Contiguous axial images were obtained from the base of the skull through the vertex without intravenous contrast. COMPARISON:  Neck CT 07/13/2020. PET-CT 08/29/2019. Brain MRI 06/12/2017. FINDINGS: Brain: Abnormal malignant dural thickening along the left frontal convexity (series 2, image 20) associated with overlying lytic skull metastasis. Similar dural thickening also possible along the left posterior vertex, and at the left anterior temporal lobe related to skull metastases there. Questionable  mild cerebral edema in the left anterior temporal tip (series 2, image 8). No intracranial mass effect, midline shift. No ventriculomegaly. No acute intracranial hemorrhage identified. No cortically based acute infarct identified. Chronic partially empty sella. Vascular: No suspicious intracranial vascular hyperdensity. Skull: Multiple relatively large lytic skull metastases. Along the left anterior frontal bone there is a roughly 4.2 cm diameter lytic metastasis which appears partially necrotic (series 2, image 22) and is associated with both underlying dural thickening (image 20) and overlying scalp soft tissue mass. Smaller 3.3 cm lytic metastasis along the posterior vertex at the confluence of the lambdoid sutures just to the left of midline. Elongated or 2 adjacent lytic metastases at the left middle cranial fossa including the left greater sphenoid wing and along the left lateral coronal suture (coronal image 28). Note that the sphenoid wing lesion also has eroded the superolateral wall of the left orbit (series 5, image 24) but does not bulge into the orbit at this time. Sinuses/Orbits: Previous left mastoidectomy. Visualized paranasal sinuses and mastoids are stable and well pneumatized. Other: Abnormal scalp soft tissue thickening and heterogeneity overlying the lytic skull metastases (most notably left superior forehead series 2, image 22). Orbits soft tissues appear to remain normal. IMPRESSION: 1. Multiple relatively large lytic skull metastases, including: - dominant left anterior frontal bone metastasis which appears partially necrotic and extends into both the overlying scalp and underlying dura, and - left middle cranial fossa skull met which has eroded a portion of the left orbital wall. 2. Several areas of associated metastatic dural thickening suspected. Possible associated mild vasogenic edema at the left temporal lobe tip. 3. But no intracranial mass effect or midline shift. Electronically  Signed   By: Genevie Ann M.D.   On: 10/19/2020 19:35   CT CHEST W CONTRAST  Addendum Date: 10/20/2020   ADDENDUM REPORT: 10/20/2020 21:10 ADDENDUM: There is a new soft tissue nodule adjacent to the left kidney measuring approximately 2.4 cm. This is consistent with a new metastatic lesion. Electronically Signed   By: Constance Holster M.D.   On: 10/20/2020 21:10   Result Date: 10/20/2020 CLINICAL DATA:  Metastatic disease.  Small cell lung cancer. EXAM: CT CHEST, ABDOMEN, AND PELVIS WITH CONTRAST TECHNIQUE: Multidetector CT imaging of the chest, abdomen and pelvis was performed following the standard protocol during bolus administration of intravenous contrast. CONTRAST:  12m OMNIPAQUE IOHEXOL 300 MG/ML  SOLN COMPARISON:  CT dated 07/13/2020 FINDINGS: CT CHEST FINDINGS Cardiovascular: The heart size is unremarkable. There is a well-positioned right-sided Port-A-Cath. Minimal atherosclerotic changes are noted of the thoracic aorta without evidence for dissection or aneurysm. There is no large centrally located pulmonary embolism Mediastinum/Nodes: -- No mediastinal lymphadenopathy. --there is a persistently enlarged right hilar lymph node (axial series 2, image 30). This is relatively stable when compared to prior study. -- No axillary lymphadenopathy. -- No supraclavicular lymphadenopathy. -- Normal thyroid gland where visualized. -  Unremarkable esophagus. Lungs/Pleura: There are relatively stable post treatment changes of the left upper lobe. There is persistent soft tissue within the left hilum and surrounding the left mainstem bronchus. Overall the volume of soft tissue in the left hilum has slightly increased from prior study. There is bronchial wall thickening and mucus plugging involving the left lower lobe. There is a small left-sided pleural effusion, new since prior study. There is some debris within the left mainstem bronchus. There are unchanged pulmonary nodules in the left upper lobe.  Musculoskeletal: New lytic lesions are noted involving the right scapula, right posterior fourth rib, the medial right posterior fifth rib, the medial posterior right seventh rib. Again noted is a lytic lesion involving the anterior left fifth rib. Additional lytic lesions are suspected for example in the left glenoid (axial series 4, image 71). CT ABDOMEN PELVIS FINDINGS Hepatobiliary: The liver is normal. There is a very abnormal appearance of the gallbladder with apparent gallbladder wall thickening and intraluminal debris or tumefactive sludge. This is new since the patient's prior CT. There is suggestion of sludge or small stones in the distal common bile duct (axial series 2, image 75).There is mild dilatation of the common bile duct measuring up to approximately 7 mm. Pancreas: Normal contours without ductal dilatation. No peripancreatic fluid collection. Spleen: Unremarkable. Adrenals/Urinary Tract: --Adrenal glands: There is an unchanged left adrenal nodule measuring approximately 1.3 cm. --Right kidney/ureter: No hydronephrosis or radiopaque kidney stones. --Left kidney/ureter: No hydronephrosis or radiopaque kidney stones. --Urinary bladder: Unremarkable. Stomach/Bowel: --Stomach/Duodenum: No hiatal hernia or other gastric abnormality. Normal duodenal course and caliber. --Small bowel: Unremarkable. --Colon: Unremarkable. --Appendix: Normal. Vascular/Lymphatic: Atherosclerotic calcification is present within the non-aneurysmal abdominal aorta, without hemodynamically significant stenosis. --No retroperitoneal lymphadenopathy. --No mesenteric lymphadenopathy. --No pelvic or inguinal lymphadenopathy. Reproductive: Unremarkable Other: No ascites or free air. The abdominal wall is normal. Musculoskeletal. There is a new lytic lesion in the inferior aspect of the L5 vertebral body. There are lytic lesions involving the bilateral iliac bones that are new from prior study (axial series 2, image 106). There is a  new lytic lesion in the left acetabulum (axial series 2, image 116). IMPRESSION: 1. Findings consistent with progressive metastatic disease as evidence by multiple new lytic osseous lesions involving the thorax,  spine, and pelvis. 2. Slight interval increase in soft tissue at the left hilum. While this may represent post treatment changes, recurrent tumor is not excluded. 3. There is a trace left-sided pleural effusion. 4. Very abnormal appearance of the gallbladder with diffuse gallbladder wall thickening and hyperenhancement with intraluminal debris or sludge. Follow-up with ultrasound is recommended. 5. Mild dilatation of the common bile duct with probable filling defects within the distal common bile duct. Correlation with MRCP/ERCP is recommended. Electronically Signed: By: Constance Holster M.D. On: 10/19/2020 19:22   CT ABDOMEN PELVIS W CONTRAST  Addendum Date: 10/20/2020   ADDENDUM REPORT: 10/20/2020 21:10 ADDENDUM: There is a new soft tissue nodule adjacent to the left kidney measuring approximately 2.4 cm. This is consistent with a new metastatic lesion. Electronically Signed   By: Constance Holster M.D.   On: 10/20/2020 21:10   Result Date: 10/20/2020 CLINICAL DATA:  Metastatic disease.  Small cell lung cancer. EXAM: CT CHEST, ABDOMEN, AND PELVIS WITH CONTRAST TECHNIQUE: Multidetector CT imaging of the chest, abdomen and pelvis was performed following the standard protocol during bolus administration of intravenous contrast. CONTRAST:  120m OMNIPAQUE IOHEXOL 300 MG/ML  SOLN COMPARISON:  CT dated 07/13/2020 FINDINGS: CT CHEST FINDINGS Cardiovascular: The heart size is unremarkable. There is a well-positioned right-sided Port-A-Cath. Minimal atherosclerotic changes are noted of the thoracic aorta without evidence for dissection or aneurysm. There is no large centrally located pulmonary embolism Mediastinum/Nodes: -- No mediastinal lymphadenopathy. --there is a persistently enlarged right hilar  lymph node (axial series 2, image 30). This is relatively stable when compared to prior study. -- No axillary lymphadenopathy. -- No supraclavicular lymphadenopathy. -- Normal thyroid gland where visualized. -  Unremarkable esophagus. Lungs/Pleura: There are relatively stable post treatment changes of the left upper lobe. There is persistent soft tissue within the left hilum and surrounding the left mainstem bronchus. Overall the volume of soft tissue in the left hilum has slightly increased from prior study. There is bronchial wall thickening and mucus plugging involving the left lower lobe. There is a small left-sided pleural effusion, new since prior study. There is some debris within the left mainstem bronchus. There are unchanged pulmonary nodules in the left upper lobe. Musculoskeletal: New lytic lesions are noted involving the right scapula, right posterior fourth rib, the medial right posterior fifth rib, the medial posterior right seventh rib. Again noted is a lytic lesion involving the anterior left fifth rib. Additional lytic lesions are suspected for example in the left glenoid (axial series 4, image 71). CT ABDOMEN PELVIS FINDINGS Hepatobiliary: The liver is normal. There is a very abnormal appearance of the gallbladder with apparent gallbladder wall thickening and intraluminal debris or tumefactive sludge. This is new since the patient's prior CT. There is suggestion of sludge or small stones in the distal common bile duct (axial series 2, image 75).There is mild dilatation of the common bile duct measuring up to approximately 7 mm. Pancreas: Normal contours without ductal dilatation. No peripancreatic fluid collection. Spleen: Unremarkable. Adrenals/Urinary Tract: --Adrenal glands: There is an unchanged left adrenal nodule measuring approximately 1.3 cm. --Right kidney/ureter: No hydronephrosis or radiopaque kidney stones. --Left kidney/ureter: No hydronephrosis or radiopaque kidney stones. --Urinary  bladder: Unremarkable. Stomach/Bowel: --Stomach/Duodenum: No hiatal hernia or other gastric abnormality. Normal duodenal course and caliber. --Small bowel: Unremarkable. --Colon: Unremarkable. --Appendix: Normal. Vascular/Lymphatic: Atherosclerotic calcification is present within the non-aneurysmal abdominal aorta, without hemodynamically significant stenosis. --No retroperitoneal lymphadenopathy. --No mesenteric lymphadenopathy. --No pelvic or inguinal lymphadenopathy. Reproductive: Unremarkable Other: No  ascites or free air. The abdominal wall is normal. Musculoskeletal. There is a new lytic lesion in the inferior aspect of the L5 vertebral body. There are lytic lesions involving the bilateral iliac bones that are new from prior study (axial series 2, image 106). There is a new lytic lesion in the left acetabulum (axial series 2, image 116). IMPRESSION: 1. Findings consistent with progressive metastatic disease as evidence by multiple new lytic osseous lesions involving the thorax, spine, and pelvis. 2. Slight interval increase in soft tissue at the left hilum. While this may represent post treatment changes, recurrent tumor is not excluded. 3. There is a trace left-sided pleural effusion. 4. Very abnormal appearance of the gallbladder with diffuse gallbladder wall thickening and hyperenhancement with intraluminal debris or sludge. Follow-up with ultrasound is recommended. 5. Mild dilatation of the common bile duct with probable filling defects within the distal common bile duct. Correlation with MRCP/ERCP is recommended. Electronically Signed: By: Constance Holster M.D. On: 10/19/2020 19:22   MR 3D Recon At Scanner  Result Date: 10/20/2020 CLINICAL DATA:  Cholelithiasis.  Enhancing gallbladder wall on CT. EXAM: MRI ABDOMEN WITHOUT AND WITH CONTRAST (INCLUDING MRCP) TECHNIQUE: Multiplanar multisequence MR imaging of the abdomen was performed both before and after the administration of intravenous contrast.  Heavily T2-weighted images of the biliary and pancreatic ducts were obtained, and three-dimensional MRCP images were rendered by post processing. CONTRAST:  80m GADAVIST GADOBUTROL 1 MMOL/ML IV SOLN COMPARISON:  CT 10/19/2020, 07/13/2020 PET-CT 08/29/2019 FINDINGS: Lower chest: Lung bases are clear. Hepatobiliary: No focal hepatic lesion. There is gallbladder wall thickening involving the fundus of the gallbladder best seen on image 15/3 with thickening of single wall up to 10 mm. On postcontrast imaging there is no apparent enhancement of this of thickened gallbladder fundus. There is no intrahepatic biliary duct dilatation. The common bile duct normal caliber. No filling defect within the common bile duct. Pancreas: Pancreas is normal. No ductal dilatation. No pancreatic inflammation. Spleen: Normal spleen Adrenals/urinary tract: Adrenal glands normal. Nonenhancing cysts of the RIGHT kidney. Medial to the LEFT kidney there is an oblong lesion which is hypointense on T2 weighted imaging measuring 16 x 8 mm (image 20/15). There is mild enhancement of this lesion (image 47/series 24.) Stomach/Bowel: Stomach and limited view of the small bowel is unremarkable. Limited view of the colon is unremarkable Vascular/Lymphatic: Abdominal aorta normal caliber. No upper abdominal adenopathy. Other: No free fluid. Musculoskeletal: Enhancing lesion the posterior LEFT rib (image 20/24) IMPRESSION: 1. No evidence of choledocholithiasis.  No biliary obstruction. 2. Circumferential thickening of the gallbladder fundus without enhancement. Nonspecific finding. Favor chronic inflammation. Cannot exclude gallbladder metastasis however non enhancement would weigh against metastasis. 3. New retroperitoneal nodule medial to the LEFT kidney is concerning for lung cancer metastasis. Consider nonemergent outpatient FDG PET scan for further characterization (gallbladder could be assessed at same time). 4. Probable posterior LEFT rib  metastasis. 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 Electronically Signed   By: SSuzy BouchardM.D.   On: 10/20/2020 09:42   MR ABDOMEN MRCP W WO CONTAST  Result Date: 10/20/2020 CLINICAL DATA:  Cholelithiasis.  Enhancing gallbladder wall on CT. EXAM: MRI ABDOMEN WITHOUT AND WITH CONTRAST (INCLUDING MRCP) TECHNIQUE: Multiplanar multisequence MR imaging of the abdomen was performed both before and after the administration of intravenous contrast. Heavily T2-weighted images of the biliary and pancreatic ducts were obtained, and three-dimensional MRCP images were rendered by post processing. CONTRAST:  622mGADAVIST GADOBUTROL 1 MMOL/ML  IV SOLN COMPARISON:  CT 10/19/2020, 07/13/2020 PET-CT 08/29/2019 FINDINGS: Lower chest: Lung bases are clear. Hepatobiliary: No focal hepatic lesion. There is gallbladder wall thickening involving the fundus of the gallbladder best seen on image 15/3 with thickening of single wall up to 10 mm. On postcontrast imaging there is no apparent enhancement of this of thickened gallbladder fundus. There is no intrahepatic biliary duct dilatation. The common bile duct normal caliber. No filling defect within the common bile duct. Pancreas: Pancreas is normal. No ductal dilatation. No pancreatic inflammation. Spleen: Normal spleen Adrenals/urinary tract: Adrenal glands normal. Nonenhancing cysts of the RIGHT kidney. Medial to the LEFT kidney there is an oblong lesion which is hypointense on T2 weighted imaging measuring 16 x 8 mm (image 20/15). There is mild enhancement of this lesion (image 47/series 24.) Stomach/Bowel: Stomach and limited view of the small bowel is unremarkable. Limited view of the colon is unremarkable Vascular/Lymphatic: Abdominal aorta normal caliber. No upper abdominal adenopathy. Other: No free fluid. Musculoskeletal: Enhancing lesion the posterior LEFT rib (image 20/24) IMPRESSION: 1. No evidence of choledocholithiasis.  No biliary obstruction.  2. Circumferential thickening of the gallbladder fundus without enhancement. Nonspecific finding. Favor chronic inflammation. Cannot exclude gallbladder metastasis however non enhancement would weigh against metastasis. 3. New retroperitoneal nodule medial to the LEFT kidney is concerning for lung cancer metastasis. Consider nonemergent outpatient FDG PET scan for further characterization (gallbladder could be assessed at same time). 4. 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 Electronically Signed   By: Suzy Bouchard M.D.   On: 10/20/2020 09:42   DG Hip Unilat W or Wo Pelvis 2-3 Views Left  Result Date: 10/19/2020 CLINICAL DATA:  Severe hip pain EXAM: DG HIP (WITH OR WITHOUT PELVIS) 2-3V LEFT COMPARISON:  CT 05/11/2020, pelvis radiograph 10/18/2020 FINDINGS: SI joints are patent. Pubic symphysis and rami appear intact. No fracture or malalignment. The joint space appears maintained IMPRESSION: Negative. Electronically Signed   By: Donavan Foil M.D.   On: 10/19/2020 17:02   US Abdomen Limited RUQ (LIVER/GB)  Result Date: 10/20/2020 CLINICAL DATA:  Elevated LFTs EXAM: ULTRASOUND ABDOMEN LIMITED RIGHT UPPER QUADRANT COMPARISON:  CT 10/19/2020 FINDINGS: Gallbladder: Contracted gallbladder with wall thickening. Similar to CT same day. Common bile duct: Diameter: Normal diameter at 5 mm Liver: Normal liver parenchyma. No duct dilatation. Portal vein is patent on color Doppler imaging with normal direction of blood flow towards the liver. Other: No free fluid IMPRESSION: 1. Contracted gallbladder with thickened wall. Findings similar to comparison CT same day. 2. Negative sonographic Murphy's sign. 3. No biliary duct dilatation. Electronically Signed   By: Suzy Bouchard M.D.   On: 10/20/2020 06:46      IMPRESSION/PLAN: This visit was conducted via telephone to spare the patient unnecessary potential exposure in the healthcare setting during  the current COVID-19 pandemic 1. 54 y.o. 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 Today, we talked to the patient and his wife about the findings and work-up thus far.  We discussed the natural history of metastatic lung cancer and the general treatment, highlighting the role of palliative radiotherapy in the management of painful osseous metastatic disease.  The recommendation is to proceed with a 10 fraction course of daily palliative external beam radation to the painful sites of disease at L5 and the left hip.  We will also plan to treat the skull lesions at the same time in  an effort to prevent the large left frontal scalp metastasis from eroding through the skin.  We discussed the available radiation techniques, and focused on the details and logistics of delivery which he is quite familiar with.  We reviewed the anticipated acute and late sequelae associated with radiation in this setting.   The patient was encouraged to ask questions that were answered to his stated satisfaction.   At the end of our conversation, the patient is interested in proceeding with palliative radiotherapy to the painful osseous metastases at L5 and the left hip as well as treatment of the skull lesions at the same time.  He will proceed to CT simulation/treatment planning following our visit today in anticipation of beginning his treatments on Monday, 10/29/20.  He has freely signed written consent to proceed, and a copy of this document will be placed in his chart.  We will share our discussion with Dr. Earlie Server and move forward with treatment planning accordingly.    Nicholos Johns, PA-C    Tyler Pita, MD  [facility] Direct Dial: [phone]  Fax: [phone] [email or web address]  Skype  LinkedIn

## 2020-10-26 NOTE — Progress Notes (Signed)
  Radiation Oncology         [phone] ________________________________  Name: Trevor Zavala MRN: [id number]  Date: 10/26/2020  DOB: [birth year]  SIMULATION AND TREATMENT PLANNING NOTE    ICD-10-CM   1. 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  NARRATIVE:  The patient was brought to the [facility].  Identity was confirmed.  All relevant records and images related to the planned course of therapy were reviewed.  The patient freely provided informed written consent to proceed with treatment after reviewing the details related to the planned course of therapy. The consent form was witnessed and verified by the simulation staff.  Then, the patient was set-up in a stable reproducible  supine position for radiation therapy.  CT images were obtained.  Surface markings were placed.  The CT images were loaded into the planning software.  Then the target and avoidance structures were contoured.  Treatment planning then occurred.  The radiation prescription was entered and confirmed.  Then, I designed and supervised the construction of a total of 8 medically necessary complex treatment devices including the following: 1 aquaplast mask and two MLCs to shield the brain treating skull lesions, and one BodyFix leg holder and 4 MLCs to shield the bowel treating the L5 and pelvic bone mets.  I have requested : 3D Simulation  I have requested a DVH of the following structures: left kidney and right kidney.  PLAN:  The patient will receive 30 Gy in 10 fractions.  ________________________________  Sheral Apley Tammi Klippel, M.D.  This document serves as a record of services personally performed by Tyler Pita, MD. It was created on his behalf by Wilburn Mylar, a trained medical scribe. The creation of this record is based on the  scribe's personal observations and the provider's statements to them. This document has been checked and approved by the attending provider.

## 2020-10-26 NOTE — Progress Notes (Signed)
Patient here today as a new follow-up and for simulation. Patient states that his pain level is a 6/10 on the pain scale. Patient states that he has sharp pain in his entire left leg that radiates to his ankle. Patient states that he has lower back pain and left hip pain. Patient is taking oxycodone for pain and started the Fentanyl patch yesterday.

## 2020-10-27 LAB — HIV-1 RNA QUANT-NO REFLEX-BLD
HIV 1 RNA Quant: <20 copies/mL
LOG10 HIV-1 RNA: UNDETERMINED log10copy/mL

## 2020-10-29 ENCOUNTER — Telehealth: Payer: Self-pay | Admitting: *Deleted

## 2020-10-29 ENCOUNTER — Ambulatory Visit
Admission: RE | Admit: 2020-10-29 | Discharge: 2020-10-29 | Disposition: A | Discharge status: Home / Self Care | Payer: 59 | Source: Ambulatory Visit | Attending: Radiation Oncology | Admitting: Radiation Oncology

## 2020-10-29 DIAGNOSIS — C7951 Secondary malignant neoplasm of bone: Secondary | ICD-10-CM | POA: Diagnosis not present

## 2020-10-29 NOTE — Telephone Encounter (Signed)
Notified pt to add another 25 microgram patch to skin now for total of 25 micrograms & change every 3 days & cont. Oxycodone.  He has # 12 left of oxycodone which should be 2 days worth but instructed to call back tomorrow or next day for refill.  Informed that it takes some time to get duragesic in his system.

## 2020-10-29 NOTE — Telephone Encounter (Signed)
Thanks Michelle

## 2020-10-29 NOTE — Telephone Encounter (Signed)
Received vm call from pt stating that he is not feeling a lot better with his pain since adding fentynol patch. He changed patch recently & has been taking oxycodone every 4 hours.  He thinks patch or oxy or both need to be increased.  He c/o pain in his L hip, back & L leg & rates # 8 now.  He is on his way to radiation.  Message to Dr Julien Nordmann.

## 2020-10-29 NOTE — Telephone Encounter (Signed)
Thank you :)

## 2020-10-29 NOTE — Telephone Encounter (Signed)
Following up on lab alert - patient's HIV lab from hospitalization on 12/11 is a false positive per Dr Tommy Medal. Patient had negative qualitative and negative viral load. Landis Gandy, RN

## 2020-10-30 ENCOUNTER — Telehealth: Payer: Self-pay | Admitting: Medical Oncology

## 2020-10-30 ENCOUNTER — Other Ambulatory Visit: Payer: Self-pay

## 2020-10-30 ENCOUNTER — Other Ambulatory Visit: Payer: Self-pay | Admitting: Internal Medicine

## 2020-10-30 ENCOUNTER — Ambulatory Visit
Admission: RE | Admit: 2020-10-30 | Discharge: 2020-10-30 | Disposition: A | Discharge status: Home / Self Care | Payer: 59 | Source: Ambulatory Visit | Attending: Radiation Oncology | Admitting: Radiation Oncology

## 2020-10-30 DIAGNOSIS — C7951 Secondary malignant neoplasm of bone: Secondary | ICD-10-CM | POA: Diagnosis not present

## 2020-10-30 MED ORDER — OXYCODONE HCL 10 MG PO TABS: 10.0000 mg | ORAL_TABLET | ORAL | 0 refills | Status: DC | PRN

## 2020-10-30 MED ORDER — FENTANYL 50 MCG/HR TD PT72: 1.0000 | MEDICATED_PATCH | TRANSDERMAL | 0 refills | Status: DC

## 2020-10-30 MED FILL — oxyCODONE HCL 10 MG TABS: 10 | 4 days supply | Qty: 28 | Fill #0

## 2020-10-30 NOTE — Telephone Encounter (Signed)
Refills- Yesterday he started fentanyl 50 mcg ( two  25 mcg ) and said it is helping a little more -he still is using his oxycodone.   He will need a refill on oxycodone and fentanyl 50 mcg patches.

## 2020-10-31 ENCOUNTER — Other Ambulatory Visit: Payer: Self-pay

## 2020-10-31 ENCOUNTER — Ambulatory Visit
Admission: RE | Admit: 2020-10-31 | Discharge: 2020-10-31 | Disposition: A | Discharge status: Home / Self Care | Payer: 59 | Source: Ambulatory Visit | Attending: Radiation Oncology | Admitting: Radiation Oncology

## 2020-10-31 ENCOUNTER — Telehealth: Payer: Self-pay | Admitting: Radiation Oncology

## 2020-10-31 DIAGNOSIS — C7951 Secondary malignant neoplasm of bone: Secondary | ICD-10-CM | POA: Diagnosis not present

## 2020-10-31 NOTE — Telephone Encounter (Signed)
Received orange folder with LEAVE REQUEST paperwork for [employer], patient's employer. Paperwork mostly complete but type of leave, start and end date of leave and accommodations were unknown. This RN phoned the patient's home. I spoke with the patient and his wife over speaker phone. They explain the patient needs to extend his short term leave starting 11/18/2020-02/11/2021. 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 Patient is unable to do any and all job duties at this time due to pain and need for routine opioids. Completed forms to the best of my ability then placed the orange folder in [employer], PA-C inbox to sign.

## 2020-11-01 ENCOUNTER — Other Ambulatory Visit: Payer: Self-pay

## 2020-11-01 ENCOUNTER — Ambulatory Visit
Admission: RE | Admit: 2020-11-01 | Discharge: 2020-11-01 | Disposition: A | Discharge status: Home / Self Care | Payer: 59 | Source: Ambulatory Visit | Attending: Radiation Oncology | Admitting: Radiation Oncology

## 2020-11-01 DIAGNOSIS — C7951 Secondary malignant neoplasm of bone: Secondary | ICD-10-CM | POA: Diagnosis not present

## 2020-11-02 ENCOUNTER — Inpatient Hospital Stay: Payer: 59

## 2020-11-05 ENCOUNTER — Telehealth: Payer: Self-pay | Admitting: Medical Oncology

## 2020-11-05 ENCOUNTER — Other Ambulatory Visit: Payer: Self-pay | Admitting: Internal Medicine

## 2020-11-05 ENCOUNTER — Ambulatory Visit
Admission: RE | Admit: 2020-11-05 | Discharge: 2020-11-05 | Disposition: A | Discharge status: Home / Self Care | Payer: 59 | Source: Ambulatory Visit | Attending: Radiation Oncology | Admitting: Radiation Oncology

## 2020-11-05 DIAGNOSIS — C7951 Secondary malignant neoplasm of bone: Secondary | ICD-10-CM | POA: Diagnosis not present

## 2020-11-05 MED ORDER — OXYCODONE HCL 10 MG PO TABS: 10.0000 mg | ORAL_TABLET | ORAL | 0 refills | Status: DC | PRN

## 2020-11-05 NOTE — Telephone Encounter (Signed)
Requests Refill for oxycodone.   He is still taking oxycodone every 4 hours and fentanyl every 72 hours.  The fentanyl increase has helped some but he still is having breakthrough pain .  He has 2 tablets left.

## 2020-11-05 NOTE — Telephone Encounter (Signed)
I did refill the oxycodone but we may need to refer him to a pain clinic if he continues to use pain medication at increased frequency.  Thank you.

## 2020-11-06 ENCOUNTER — Inpatient Hospital Stay: Payer: 59

## 2020-11-06 ENCOUNTER — Telehealth: Payer: Self-pay | Admitting: Medical Oncology

## 2020-11-06 ENCOUNTER — Other Ambulatory Visit: Payer: Self-pay

## 2020-11-06 ENCOUNTER — Ambulatory Visit
Admission: RE | Admit: 2020-11-06 | Discharge: 2020-11-06 | Disposition: A | Discharge status: Home / Self Care | Payer: 59 | Source: Ambulatory Visit | Attending: Radiation Oncology | Admitting: Radiation Oncology

## 2020-11-06 ENCOUNTER — Encounter (HOSPITAL_COMMUNITY)
Admission: RE | Admit: 2020-11-06 | Discharge: 2020-11-06 | Disposition: A | Discharge status: Home / Self Care | Payer: 59 | Source: Ambulatory Visit | Attending: Internal Medicine | Admitting: Internal Medicine

## 2020-11-06 ENCOUNTER — Ambulatory Visit (HOSPITAL_COMMUNITY)
Admission: RE | Admit: 2020-11-06 | Discharge: 2020-11-06 | Disposition: A | Discharge status: Home / Self Care | Payer: 59 | Source: Ambulatory Visit | Attending: Internal Medicine | Admitting: Internal Medicine

## 2020-11-06 DIAGNOSIS — C3490 Malignant neoplasm of unspecified part of unspecified bronchus or lung: Secondary | ICD-10-CM

## 2020-11-06 DIAGNOSIS — C321 Malignant neoplasm of supraglottis: Secondary | ICD-10-CM

## 2020-11-06 DIAGNOSIS — Z95828 Presence of other vascular implants and grafts: Secondary | ICD-10-CM

## 2020-11-06 LAB — GLUCOSE, CAPILLARY: Glucose-Capillary: 87 mg/dL (ref 70–99)

## 2020-11-06 MED ORDER — FLUDEOXYGLUCOSE F - 18 (FDG) INJECTION
7.4000 | Freq: Once | INTRAVENOUS | Status: AC
  Administered 2020-11-06: 7.4 via INTRAVENOUS

## 2020-11-06 MED ORDER — GADOBUTROL 1 MMOL/ML IV SOLN
6.0000 mL | Freq: Once | INTRAVENOUS | Status: AC | PRN
  Administered 2020-11-06: 13:00:00 6 mL via INTRAVENOUS

## 2020-11-06 MED ORDER — SODIUM CHLORIDE 0.9% FLUSH
10.0000 mL | Freq: Once | INTRAVENOUS | Status: AC
  Administered 2020-11-06: 10 mL
  Filled 2020-11-06: qty 10

## 2020-11-06 NOTE — Telephone Encounter (Signed)
ED report of MRI brain  Please notify Julien Nordmann of the following  "Multifocal left cerebellar restricted diffusion concerning for acute/subacute infarcts versus evolving metastatic lesions".

## 2020-11-07 ENCOUNTER — Ambulatory Visit
Admission: RE | Admit: 2020-11-07 | Discharge: 2020-11-07 | Disposition: A | Discharge status: Home / Self Care | Payer: 59 | Source: Ambulatory Visit | Attending: Radiation Oncology | Admitting: Radiation Oncology

## 2020-11-07 ENCOUNTER — Inpatient Hospital Stay: Payer: 59

## 2020-11-07 ENCOUNTER — Inpatient Hospital Stay (HOSPITAL_BASED_OUTPATIENT_CLINIC_OR_DEPARTMENT_OTHER): Payer: 59 | Admitting: Internal Medicine

## 2020-11-07 ENCOUNTER — Other Ambulatory Visit: Payer: Self-pay

## 2020-11-07 ENCOUNTER — Other Ambulatory Visit: Payer: Self-pay | Admitting: Radiation Therapy

## 2020-11-07 VITALS — BP 86/67 | HR 90 | Temp 98.0°F | Resp 18 | Ht 70.0 in | Wt 126.6 lb

## 2020-11-07 VITALS — BP 108/72 | HR 84 | Resp 18

## 2020-11-07 DIAGNOSIS — C321 Malignant neoplasm of supraglottis: Secondary | ICD-10-CM

## 2020-11-07 DIAGNOSIS — C7951 Secondary malignant neoplasm of bone: Secondary | ICD-10-CM

## 2020-11-07 DIAGNOSIS — C3412 Malignant neoplasm of upper lobe, left bronchus or lung: Secondary | ICD-10-CM

## 2020-11-07 DIAGNOSIS — Z95828 Presence of other vascular implants and grafts: Secondary | ICD-10-CM

## 2020-11-07 DIAGNOSIS — I10 Essential (primary) hypertension: Secondary | ICD-10-CM

## 2020-11-07 DIAGNOSIS — Z7189 Other specified counseling: Secondary | ICD-10-CM

## 2020-11-07 DIAGNOSIS — Z5111 Encounter for antineoplastic chemotherapy: Secondary | ICD-10-CM

## 2020-11-07 DIAGNOSIS — C3490 Malignant neoplasm of unspecified part of unspecified bronchus or lung: Secondary | ICD-10-CM

## 2020-11-07 DIAGNOSIS — Z5112 Encounter for antineoplastic immunotherapy: Secondary | ICD-10-CM

## 2020-11-07 DIAGNOSIS — E440 Moderate protein-calorie malnutrition: Secondary | ICD-10-CM

## 2020-11-07 LAB — CBC WITH DIFFERENTIAL (CANCER CENTER ONLY)
Abs Immature Granulocytes: 0.05 10*3/uL (ref 0.00–0.07)
Basophils Absolute: 0 10*3/uL (ref 0.0–0.1)
Basophils Relative: 0 %
Eosinophils Absolute: 0.1 10*3/uL (ref 0.0–0.5)
Eosinophils Relative: 2 %
HCT: 33.3 % — ABNORMAL LOW (ref 39.0–52.0)
Hemoglobin: 10.7 g/dL — ABNORMAL LOW (ref 13.0–17.0)
Immature Granulocytes: 1 %
Lymphocytes Relative: 4 %
Lymphs Abs: 0.2 10*3/uL — ABNORMAL LOW (ref 0.7–4.0)
MCH: 29.6 pg (ref 26.0–34.0)
MCHC: 32.1 g/dL (ref 30.0–36.0)
MCV: 92 fL (ref 80.0–100.0)
Monocytes Absolute: 0.6 10*3/uL (ref 0.1–1.0)
Monocytes Relative: 11 %
Neutro Abs: 4.9 10*3/uL (ref 1.7–7.7)
Neutrophils Relative %: 82 %
Platelet Count: 366 10*3/uL (ref 150–400)
RBC: 3.62 MIL/uL — ABNORMAL LOW (ref 4.22–5.81)
RDW: 12.5 % (ref 11.5–15.5)
WBC Count: 5.9 10*3/uL (ref 4.0–10.5)
nRBC: 0 % (ref 0.0–0.2)

## 2020-11-07 LAB — CMP (CANCER CENTER ONLY)
ALT: 32 U/L (ref 0–44)
AST: 19 U/L (ref 15–41)
Albumin: 2.7 g/dL — ABNORMAL LOW (ref 3.5–5.0)
Alkaline Phosphatase: 122 U/L (ref 38–126)
Anion gap: 8 (ref 5–15)
BUN: 7 mg/dL (ref 6–20)
CO2: 31 mmol/L (ref 22–32)
Calcium: 11.3 mg/dL — ABNORMAL HIGH (ref 8.9–10.3)
Chloride: 100 mmol/L (ref 98–111)
Creatinine: 0.69 mg/dL (ref 0.61–1.24)
GFR, Estimated: >60 mL/min (ref >60)
Glucose, Bld: 192 mg/dL — ABNORMAL HIGH (ref 70–99)
Potassium: 3.7 mmol/L (ref 3.5–5.1)
Sodium: 139 mmol/L (ref 135–145)
Total Bilirubin: 0.2 mg/dL — ABNORMAL LOW (ref 0.3–1.2)
Total Protein: 6.4 g/dL — ABNORMAL LOW (ref 6.5–8.1)

## 2020-11-07 MED ORDER — ZOLEDRONIC ACID 4 MG/100ML IV SOLN
4.0000 mg | Freq: Once | INTRAVENOUS | Status: AC
  Administered 2020-11-07: 11:00:00 4 mg via INTRAVENOUS

## 2020-11-07 MED ORDER — ZOLEDRONIC ACID 4 MG/100ML IV SOLN
INTRAVENOUS | Status: AC
  Filled 2020-11-07: qty 100

## 2020-11-07 MED ORDER — SODIUM CHLORIDE 0.9% FLUSH
10.0000 mL | Freq: Once | INTRAVENOUS | Status: AC
  Administered 2020-11-07: 13:00:00 10 mL
  Filled 2020-11-07: qty 10

## 2020-11-07 MED ORDER — HEPARIN SOD (PORK) LOCK FLUSH 100 UNIT/ML IV SOLN
500.0000 [IU] | Freq: Once | INTRAVENOUS | Status: AC
  Administered 2020-11-07: 13:00:00 500 [IU]
  Filled 2020-11-07: qty 5

## 2020-11-07 MED ORDER — SODIUM CHLORIDE 0.9 % IV SOLN
INTRAVENOUS | Status: DC
  Filled 2020-11-07 (×2): qty 250

## 2020-11-07 NOTE — Progress Notes (Signed)
DISCONTINUE ON PATHWAY REGIMEN - Non-Small Cell Lung     A cycle is 21 days:     Pembrolizumab   **Always confirm dose/schedule in your pharmacy ordering system**  REASON: Disease Progression PRIOR TREATMENT: [id number]: Pembrolizumab 200 mg q21 Days Until Disease Progression, Unacceptable Toxicity, or up to 24 Months TREATMENT RESPONSE: Stable Disease (SD)  START ON PATHWAY REGIMEN - Non-Small Cell Lung     A cycle is every 21 days:     Pembrolizumab      Paclitaxel      Carboplatin   **Always confirm dose/schedule in your pharmacy ordering system**  Patient Characteristics: Stage IV Metastatic, Squamous, PS = 0, 1, First Line, PD-L1 Expression Positive ? 50% (TPS) and Immunotherapy Candidate Therapeutic Status: Stage IV Metastatic Histology: Squamous Cell Line of therapy: First Line ECOG Performance Status: 1 PD-L1 Expression Status: PD-L1 Positive ? 50% (TPS) Immunotherapy Candidate Status: Candidate for Immunotherapy Intent of Therapy: Non-Curative / Palliative Intent, Discussed with Patient

## 2020-11-07 NOTE — Progress Notes (Addendum)
[place] [phone]   [phone]  OFFICE PROGRESS NOTE  [name], [name], MD [place] [address]  DIAGNOSIS:  1) Stage IV (T3, N3, M1a) non-small cell lung cancer, squamous cell carcinoma presented with large left hilar mass in addition to mediastinal and left supraclavicular lymphadenopathy as well as contralateral right upper lobe nodule diagnosed in August 2018.  He has disease recurrence in December 2021. PDL 1 expression: 90%. 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 First dose 06/29/2017. Status post 6 cycles. Last dose was given 08/03/2017. 2)  Immunotherapy with Ketruda 200 MG IV every 3 weeks, first dose 09/17/2017.  Status post 35 cycles.  Last dose was September 01, 2019. 3) palliative radiotherapy to the painful lesions in the left fifth rib under the care of Dr. Tammi Klippel. 4) palliative radiotherapy to the left hip under the care of Dr. Tammi Klippel.  CURRENT THERAPY: Systemic chemotherapy with carboplatin for AUC of 5, paclitaxel 175 mg/M2 and Keytruda 200 mg IV every 3 weeks with Neulasta support.  First dose November 16, 2020.  INTERVAL HISTORY: Trevor Zavala 54 y.o. male returns to the clinic today for follow-up visit accompanied by his wife.  The patient continues to complain of increasing fatigue and weakness as well as pain in the right shoulder and left hip.  He is currently undergoing palliative radiotherapy under the care of Dr. Tammi Klippel.  He was started on pain medication initially with fentanyl patch 25 mcg/hour every 3 days which was later increased to 50 mcg/hour every 3 days in addition to the oxycodone for breakthrough pain but his pain is not well controlled.  The patient also has significant weight loss recently with lack of appetite.  His initial positive HIV test was  false negative after evaluation by infectious disease and HIV RNA test was negative.  The patient had a PET scan as well as MRI of the brain performed recently and he is here for evaluation and discussion of his treatment options based on the recent imaging studies.  He has no current nausea, vomiting or diarrhea but has constipation.  He has no fever or chills.  He has no headache or visual changes.   MEDICAL HISTORY: Past Medical History:  Diagnosis Date  . Anxiety   . COPD (chronic obstructive pulmonary disease) ([place])   . GERD (gastroesophageal reflux disease)   . Headache   . Hypertension   . Pneumonia   . Situational depression   . Stage III squamous cell carcinoma of left lung ([facility]) 06/19/2017   Lungs & Epiglottis  . Tubular adenoma of colon 01/2015  . Tubular adenoma of colon 01/2015    ALLERGIES:  is allergic to ativan [lorazepam] and escitalopram oxalate.  MEDICATIONS:  Current Outpatient Medications  Medication Sig Dispense Refill  . acyclovir (ZOVIRAX) 400 MG tablet Take 1 tablet (400 mg total) by mouth 3 (three) times daily. (Patient not taking: No sig reported) 21 tablet 3  . BREO ELLIPTA 100-25 MCG/INH AEPB INHALE 1 PUFF INTO THE LUNGS DAILY. ANNUAL APPT IS OVERDUE MUST SEE PROVIDER FOR FUTURE REFILLS 60 each 0  . clonazePAM (KLONOPIN) 0.5 MG tablet TAKE 1 OR 2 TABLETS BY MOUTH TWICE A DAY AS NEEDED FOR ANXIETY (Patient taking differently: Take 0.25 mg by mouth daily.) 180 tablet 0  . docusate sodium (COLACE)  100 MG capsule Take 200 mg by mouth daily.    Marland Kitchen esomeprazole (NEXIUM) 40 MG capsule TAKE 1 CAPSULE BY MOUTH EVERY DAY IN THE MORNING (Patient taking differently: Take 40 mg by mouth daily.) 90 capsule 3  . fentaNYL (DURAGESIC) 50 MCG/HR Place 1 patch onto the skin every 3 (three) days. 5 patch 0  . folic acid (FOLVITE) 1 MG tablet Take 1 mg by mouth daily.    . Multiple Vitamin (MULTIVITAMIN WITH MINERALS) TABS tablet Take 1 tablet by mouth daily.    . Oxycodone HCl  10 MG TABS Take 1 tablet (10 mg total) by mouth every 4 (four) hours as needed. 40 tablet 0   No current facility-administered medications for this visit.    SURGICAL HISTORY:  Past Surgical History:  Procedure Laterality Date  . COLONOSCOPY    . DIRECT LARYNGOSCOPY N/A 06/25/2017   Procedure: DIRECT LARYNGOSCOPY AND BIOPSY;  Surgeon: Rozetta Nunnery, MD;  Location: [facility];  Service: ENT;  Laterality: N/A;  . EAR CYST EXCISION N/A 11/17/2013   Procedure: SEBACEOUS CYST CHEST;  Surgeon: Joyice Faster. Cornett, MD;  Location: [facility];  Service: General;  Laterality: N/A;  . IR GASTROSTOMY TUBE MOD SED  08/10/2017  . IR GASTROSTOMY TUBE REMOVAL  09/29/2017  . KNEE ARTHROSCOPY     LEFT  . LIPOMA EXCISION N/A 11/17/2013   Procedure: EXCISION LIPOMA FOREHEAD;  Surgeon: Joyice Faster. Cornett, MD;  Location: [facility];  Service: General;  Laterality: N/A;  . LUNG BIOPSY Bilateral 06/12/2017   Procedure: LEFT LUNG BIOPSY;  Surgeon: Grace Isaac, MD;  Location: [facility];  Service: Thoracic;  Laterality: Bilateral;  . MICROLARYNGOSCOPY W/VOCAL CORD INJECTION Left 07/16/2018   Procedure: suspened micro laryngoscopy with jet ventilation and prolaryn injection;  Surgeon: Melida Quitter, MD;  Location: [facility];  Service: ENT;  Laterality: Left;  . PORTACATH PLACEMENT Right 07/01/2017   Procedure: INSERTION PORT-A-CATH - RIGHT IJ - placed with Fluoro and Ultrasound;  Surgeon: Grace Isaac, MD;  Location: [facility];  Service: Thoracic;  Laterality: Right;  Marland Kitchen VIDEO BRONCHOSCOPY WITH ENDOBRONCHIAL ULTRASOUND N/A 06/12/2017   Procedure: VIDEO BRONCHOSCOPY WITH ENDOBRONCHIAL ULTRASOUND;  Surgeon: Grace Isaac, MD;  Location: [facility];  Service: Thoracic;  Laterality: N/A;    REVIEW OF SYSTEMS:  Constitutional: positive for anorexia, fatigue and weight loss Eyes: negative Ears, nose, mouth, throat, and face: negative Respiratory: positive for pleurisy/chest  pain Cardiovascular: negative Gastrointestinal: positive for constipation Genitourinary:negative Integument/breast: negative Hematologic/lymphatic: negative Musculoskeletal:positive for back pain, bone pain and muscle weakness Neurological: negative Behavioral/Psych: negative Endocrine: negative Allergic/Immunologic: negative   PHYSICAL EXAMINATION: General appearance: alert, cooperative, fatigued and no distress Head: Normocephalic, without obvious abnormality, atraumatic Neck: no adenopathy, no JVD, supple, symmetrical, trachea midline and thyroid not enlarged, symmetric, no tenderness/mass/nodules Lymph nodes: Cervical, supraclavicular, and axillary nodes normal. Resp: clear to auscultation bilaterally Back: symmetric, no curvature. ROM normal. No CVA tenderness. Cardio: regular rate and rhythm, S1, S2 normal, no murmur, click, rub or gallop GI: soft, non-tender; bowel sounds normal; no masses,  no organomegaly Extremities: extremities normal, atraumatic, no cyanosis or edema Neurologic: Alert and oriented X 3, normal strength and tone. Normal symmetric reflexes. Normal coordination and gait  ECOG PERFORMANCE STATUS: 1 - Symptomatic but completely ambulatory  Blood pressure (!) 86/67, pulse 90, temperature 98 F (36.7 C), temperature source Tympanic, resp. rate 18, height 5' 10"  (1.778 m), weight 126 lb 9.6 oz (57.4 kg), SpO2 99 %.  LABORATORY DATA: Lab Results  Component Value Date   WBC 5.9 11/07/2020   HGB 10.7 (L) 11/07/2020   HCT 33.3 (L) 11/07/2020   MCV 92.0 11/07/2020   PLT 366 11/07/2020      Chemistry      Component Value Date/Time   NA 141 10/23/2020 0840   NA 141 10/29/2017 1049   K 3.3 (L) 10/23/2020 0840   K 3.3 (L) 10/29/2017 1049   CL 103 10/23/2020 0840   CO2 30 10/23/2020 0840   CO2 26 10/29/2017 1049   BUN 8 10/23/2020 0840   BUN 11.2 10/29/2017 1049   CREATININE 0.78 10/23/2020 0840   CREATININE 0.7 10/29/2017 1049   GLU 198 08/25/2017 0000       Component Value Date/Time   CALCIUM 10.2 10/23/2020 0840   CALCIUM 9.0 10/29/2017 1049   ALKPHOS 259 (H) 10/23/2020 0840   ALKPHOS 82 10/29/2017 1049   AST 16 10/23/2020 0840   AST 10 10/29/2017 1049   ALT 52 (H) 10/23/2020 0840   ALT 8 10/29/2017 1049   BILITOT 0.4 10/23/2020 0840   BILITOT 0.22 10/29/2017 1049       RADIOGRAPHIC STUDIES: DG Chest 2 View  Result Date: 10/19/2020 CLINICAL DATA:  Rib pain.  History of bone metastasis. EXAM: CHEST - 2 VIEW COMPARISON:  Chest x-ray 07/30/2020.  CT 07/13/2020, 03/02/2020. FINDINGS: PowerPort catheter noted with tip over the right atrium. Heart size normal. Stable left perihilar and left mid lung densities again noted, most consistent with scarring. Stable nodular opacities left upper lung. No acute infiltrate. Mild left base pleural thickening. No prominent pleural effusion. No pneumothorax. No acute bony abnormality identified. Degenerative change thoracic spine. IMPRESSION: 1. PowerPort catheter noted with tip over right atrium. 2. Stable left perihilar and left mid lung densities again noted, most consistent with scarring. Stable nodular opacities left upper lung. No acute infiltrate. Electronically Signed   By: Marcello Moores  Register   On: 10/19/2020 17:03   DG Lumbar Spine Complete  Result Date: 10/19/2020 CLINICAL DATA:  Abnormal lab draw severe left hip pain back pain EXAM: LUMBAR SPINE - COMPLETE 4+ VIEW COMPARISON:  CT 05/11/2020 FINDINGS: Lumbar alignment within normal limits. Vertebral body heights are maintained. Mild degenerative changes at multiple levels. Mild facet degenerative changes of the lower lumbar spine. Aortic atherosclerosis IMPRESSION: Mild degenerative change. Electronically Signed   By: Donavan Foil M.D.   On: 10/19/2020 17:01   CT HEAD WO CONTRAST  Result Date: 10/19/2020 CLINICAL DATA:  54 year old male with non-small cell lung cancer undergoing treatment. Dizziness. Fatigue and weakness. EXAM: CT HEAD  WITHOUT CONTRAST TECHNIQUE: Contiguous axial images were obtained from the base of the skull through the vertex without intravenous contrast. COMPARISON:  Neck CT 07/13/2020. PET-CT 08/29/2019. Brain MRI 06/12/2017. FINDINGS: Brain: Abnormal malignant dural thickening along the left frontal convexity (series 2, image 20) associated with overlying lytic skull metastasis. Similar dural thickening also possible along the left posterior vertex, and at the left anterior temporal lobe related to skull metastases there. Questionable mild cerebral edema in the left anterior temporal tip (series 2, image 8). No intracranial mass effect, midline shift. No ventriculomegaly. No acute intracranial hemorrhage identified. No cortically based acute infarct identified. Chronic partially empty sella. Vascular: No suspicious intracranial vascular hyperdensity. Skull: Multiple relatively large lytic skull metastases. Along the left anterior frontal bone there is a roughly 4.2 cm diameter lytic metastasis which appears partially necrotic (series 2, image 22) and is associated with both underlying dural thickening (  image 20) and overlying scalp soft tissue mass. Smaller 3.3 cm lytic metastasis along the posterior vertex at the confluence of the lambdoid sutures just to the left of midline. Elongated or 2 adjacent lytic metastases at the left middle cranial fossa including the left greater sphenoid wing and along the left lateral coronal suture (coronal image 28). Note that the sphenoid wing lesion also has eroded the superolateral wall of the left orbit (series 5, image 24) but does not bulge into the orbit at this time. Sinuses/Orbits: Previous left mastoidectomy. Visualized paranasal sinuses and mastoids are stable and well pneumatized. Other: Abnormal scalp soft tissue thickening and heterogeneity overlying the lytic skull metastases (most notably left superior forehead series 2, image 22). Orbits soft tissues appear to remain normal.  IMPRESSION: 1. Multiple relatively large lytic skull metastases, including: - dominant left anterior frontal bone metastasis which appears partially necrotic and extends into both the overlying scalp and underlying dura, and - left middle cranial fossa skull met which has eroded a portion of the left orbital wall. 2. Several areas of associated metastatic dural thickening suspected. Possible associated mild vasogenic edema at the left temporal lobe tip. 3. But no intracranial mass effect or midline shift. Electronically Signed   By: Genevie Ann M.D.   On: 10/19/2020 19:35   CT CHEST W CONTRAST  Addendum Date: 10/20/2020   ADDENDUM REPORT: 10/20/2020 21:10 ADDENDUM: There is a new soft tissue nodule adjacent to the left kidney measuring approximately 2.4 cm. This is consistent with a new metastatic lesion. Electronically Signed   By: Constance Holster M.D.   On: 10/20/2020 21:10   Result Date: 10/20/2020 CLINICAL DATA:  Metastatic disease.  Small cell lung cancer. EXAM: CT CHEST, ABDOMEN, AND PELVIS WITH CONTRAST TECHNIQUE: Multidetector CT imaging of the chest, abdomen and pelvis was performed following the standard protocol during bolus administration of intravenous contrast. CONTRAST:  192m OMNIPAQUE IOHEXOL 300 MG/ML  SOLN COMPARISON:  CT dated 07/13/2020 FINDINGS: CT CHEST FINDINGS Cardiovascular: The heart size is unremarkable. There is a well-positioned right-sided Port-A-Cath. Minimal atherosclerotic changes are noted of the thoracic aorta without evidence for dissection or aneurysm. There is no large centrally located pulmonary embolism Mediastinum/Nodes: -- No mediastinal lymphadenopathy. --there is a persistently enlarged right hilar lymph node (axial series 2, image 30). This is relatively stable when compared to prior study. -- No axillary lymphadenopathy. -- No supraclavicular lymphadenopathy. -- Normal thyroid gland where visualized. -  Unremarkable esophagus. Lungs/Pleura: There are relatively  stable post treatment changes of the left upper lobe. There is persistent soft tissue within the left hilum and surrounding the left mainstem bronchus. Overall the volume of soft tissue in the left hilum has slightly increased from prior study. There is bronchial wall thickening and mucus plugging involving the left lower lobe. There is a small left-sided pleural effusion, new since prior study. There is some debris within the left mainstem bronchus. There are unchanged pulmonary nodules in the left upper lobe. Musculoskeletal: New lytic lesions are noted involving the right scapula, right posterior fourth rib, the medial right posterior fifth rib, the medial posterior right seventh rib. Again noted is a lytic lesion involving the anterior left fifth rib. Additional lytic lesions are suspected for example in the left glenoid (axial series 4, image 71). CT ABDOMEN PELVIS FINDINGS Hepatobiliary: The liver is normal. There is a very abnormal appearance of the gallbladder with apparent gallbladder wall thickening and intraluminal debris or tumefactive sludge. This is new since the patient's prior  CT. There is suggestion of sludge or small stones in the distal common bile duct (axial series 2, image 75).There is mild dilatation of the common bile duct measuring up to approximately 7 mm. Pancreas: Normal contours without ductal dilatation. No peripancreatic fluid collection. Spleen: Unremarkable. Adrenals/Urinary Tract: --Adrenal glands: There is an unchanged left adrenal nodule measuring approximately 1.3 cm. --Right kidney/ureter: No hydronephrosis or radiopaque kidney stones. --Left kidney/ureter: No hydronephrosis or radiopaque kidney stones. --Urinary bladder: Unremarkable. Stomach/Bowel: --Stomach/Duodenum: No hiatal hernia or other gastric abnormality. Normal duodenal course and caliber. --Small bowel: Unremarkable. --Colon: Unremarkable. --Appendix: Normal. Vascular/Lymphatic: Atherosclerotic calcification is  present within the non-aneurysmal abdominal aorta, without hemodynamically significant stenosis. --No retroperitoneal lymphadenopathy. --No mesenteric lymphadenopathy. --No pelvic or inguinal lymphadenopathy. Reproductive: Unremarkable Other: No ascites or free air. The abdominal wall is normal. Musculoskeletal. There is a new lytic lesion in the inferior aspect of the L5 vertebral body. There are lytic lesions involving the bilateral iliac bones that are new from prior study (axial series 2, image 106). There is a new lytic lesion in the left acetabulum (axial series 2, image 116). IMPRESSION: 1. Findings consistent with progressive metastatic disease as evidence by multiple new lytic osseous lesions involving the thorax, spine, and pelvis. 2. Slight interval increase in soft tissue at the left hilum. While this may represent post treatment changes, recurrent tumor is not excluded. 3. There is a trace left-sided pleural effusion. 4. Very abnormal appearance of the gallbladder with diffuse gallbladder wall thickening and hyperenhancement with intraluminal debris or sludge. Follow-up with ultrasound is recommended. 5. Mild dilatation of the common bile duct with probable filling defects within the distal common bile duct. Correlation with MRCP/ERCP is recommended. Electronically Signed: By: Constance Holster M.D. On: 10/19/2020 19:22   MR BRAIN W WO CONTRAST  Result Date: 11/06/2020 CLINICAL DATA:  Non-small cell lung cancer, staging. EXAM: MRI HEAD WITHOUT AND WITH CONTRAST TECHNIQUE: Multiplanar, multiecho pulse sequences of the brain and surrounding structures were obtained without and with intravenous contrast. CONTRAST:  45m GADAVIST GADOBUTROL 1 MMOL/ML IV SOLN COMPARISON:  10/19/2020 and prior. FINDINGS: Brain: Multifocal nonenhancing restricted diffusion/T2 hyperintensity involving the left cerebellum measuring up to 8 mm. No intracranial hemorrhage. No midline shift, ventriculomegaly or extra-axial  fluid collection. Centrally necrotic enhancing left frontal calvarial lesion measuring 4.3 x 3.2 cm with underlying dural thickening (15:49). Abnormal enhancing tissue measuring 2.8 x 2.8 cm (15:24) involving the left temporal fossa/temporalis muscle with underlying dural thickening and enhancement. Enhancing midline parietal calvarial lesion measuring 3.4 x 1.5 cm (15:33) with underlying dural thickening and enhancement. Asymmetric prominence of the lateral left parietal calvarium measuring 2.3 x 0.4 cm (15:33) may reflect an additional osseous metastases. Vascular: Normal flow voids. Skull and upper cervical spine: Please see above. Sinuses/Orbits: Normal orbits. Minimal left maxillary sinus mucosal thickening. No mastoid effusion. Other: None. IMPRESSION: Multifocal left cerebellar restricted diffusion concerning for acute/subacute infarcts versus evolving metastatic lesions. Multifocal calvarial lesions with underlying dural metastases involving the left frontal, midline parietal and left temporal regions. Prominence of the lateral left parietal calvarium may reflect a smaller osseous metastatic lesion. These results will be called to the ordering clinician or representative by the Radiologist Assistant, and communication documented in the PACS or [employer] Electronically Signed   By: CPrimitivo GauzeM.D.   On: 11/06/2020 13:12   CT ABDOMEN PELVIS W CONTRAST  Addendum Date: 10/20/2020   ADDENDUM REPORT: 10/20/2020 21:10 ADDENDUM: There is a new soft tissue nodule adjacent to the left  kidney measuring approximately 2.4 cm. This is consistent with a new metastatic lesion. Electronically Signed   By: Constance Holster M.D.   On: 10/20/2020 21:10   Result Date: 10/20/2020 CLINICAL DATA:  Metastatic disease.  Small cell lung cancer. EXAM: CT CHEST, ABDOMEN, AND PELVIS WITH CONTRAST TECHNIQUE: Multidetector CT imaging of the chest, abdomen and pelvis was performed following the standard protocol  during bolus administration of intravenous contrast. CONTRAST:  130m OMNIPAQUE IOHEXOL 300 MG/ML  SOLN COMPARISON:  CT dated 07/13/2020 FINDINGS: CT CHEST FINDINGS Cardiovascular: The heart size is unremarkable. There is a well-positioned right-sided Port-A-Cath. Minimal atherosclerotic changes are noted of the thoracic aorta without evidence for dissection or aneurysm. There is no large centrally located pulmonary embolism Mediastinum/Nodes: -- No mediastinal lymphadenopathy. --there is a persistently enlarged right hilar lymph node (axial series 2, image 30). This is relatively stable when compared to prior study. -- No axillary lymphadenopathy. -- No supraclavicular lymphadenopathy. -- Normal thyroid gland where visualized. -  Unremarkable esophagus. Lungs/Pleura: There are relatively stable post treatment changes of the left upper lobe. There is persistent soft tissue within the left hilum and surrounding the left mainstem bronchus. Overall the volume of soft tissue in the left hilum has slightly increased from prior study. There is bronchial wall thickening and mucus plugging involving the left lower lobe. There is a small left-sided pleural effusion, new since prior study. There is some debris within the left mainstem bronchus. There are unchanged pulmonary nodules in the left upper lobe. Musculoskeletal: New lytic lesions are noted involving the right scapula, right posterior fourth rib, the medial right posterior fifth rib, the medial posterior right seventh rib. Again noted is a lytic lesion involving the anterior left fifth rib. Additional lytic lesions are suspected for example in the left glenoid (axial series 4, image 71). CT ABDOMEN PELVIS FINDINGS Hepatobiliary: The liver is normal. There is a very abnormal appearance of the gallbladder with apparent gallbladder wall thickening and intraluminal debris or tumefactive sludge. This is new since the patient's prior CT. There is suggestion of sludge or  small stones in the distal common bile duct (axial series 2, image 75).There is mild dilatation of the common bile duct measuring up to approximately 7 mm. Pancreas: Normal contours without ductal dilatation. No peripancreatic fluid collection. Spleen: Unremarkable. Adrenals/Urinary Tract: --Adrenal glands: There is an unchanged left adrenal nodule measuring approximately 1.3 cm. --Right kidney/ureter: No hydronephrosis or radiopaque kidney stones. --Left kidney/ureter: No hydronephrosis or radiopaque kidney stones. --Urinary bladder: Unremarkable. Stomach/Bowel: --Stomach/Duodenum: No hiatal hernia or other gastric abnormality. Normal duodenal course and caliber. --Small bowel: Unremarkable. --Colon: Unremarkable. --Appendix: Normal. Vascular/Lymphatic: Atherosclerotic calcification is present within the non-aneurysmal abdominal aorta, without hemodynamically significant stenosis. --No retroperitoneal lymphadenopathy. --No mesenteric lymphadenopathy. --No pelvic or inguinal lymphadenopathy. Reproductive: Unremarkable Other: No ascites or free air. The abdominal wall is normal. Musculoskeletal. There is a new lytic lesion in the inferior aspect of the L5 vertebral body. There are lytic lesions involving the bilateral iliac bones that are new from prior study (axial series 2, image 106). There is a new lytic lesion in the left acetabulum (axial series 2, image 116). IMPRESSION: 1. Findings consistent with progressive metastatic disease as evidence by multiple new lytic osseous lesions involving the thorax, spine, and pelvis. 2. Slight interval increase in soft tissue at the left hilum. While this may represent post treatment changes, recurrent tumor is not excluded. 3. There is a trace left-sided pleural effusion. 4. Very abnormal appearance of the gallbladder  with diffuse gallbladder wall thickening and hyperenhancement with intraluminal debris or sludge. Follow-up with ultrasound is recommended. 5. Mild dilatation  of the common bile duct with probable filling defects within the distal common bile duct. Correlation with MRCP/ERCP is recommended. Electronically Signed: By: Constance Holster M.D. On: 10/19/2020 19:22   MR 3D Recon At Scanner  Result Date: 10/20/2020 CLINICAL DATA:  Cholelithiasis.  Enhancing gallbladder wall on CT. EXAM: MRI ABDOMEN WITHOUT AND WITH CONTRAST (INCLUDING MRCP) TECHNIQUE: Multiplanar multisequence MR imaging of the abdomen was performed both before and after the administration of intravenous contrast. Heavily T2-weighted images of the biliary and pancreatic ducts were obtained, and three-dimensional MRCP images were rendered by post processing. CONTRAST:  48m GADAVIST GADOBUTROL 1 MMOL/ML IV SOLN COMPARISON:  CT 10/19/2020, 07/13/2020 PET-CT 08/29/2019 FINDINGS: Lower chest: Lung bases are clear. Hepatobiliary: No focal hepatic lesion. There is gallbladder wall thickening involving the fundus of the gallbladder best seen on image 15/3 with thickening of single wall up to 10 mm. On postcontrast imaging there is no apparent enhancement of this of thickened gallbladder fundus. There is no intrahepatic biliary duct dilatation. The common bile duct normal caliber. No filling defect within the common bile duct. Pancreas: Pancreas is normal. No ductal dilatation. No pancreatic inflammation. Spleen: Normal spleen Adrenals/urinary tract: Adrenal glands normal. Nonenhancing cysts of the RIGHT kidney. Medial to the LEFT kidney there is an oblong lesion which is hypointense on T2 weighted imaging measuring 16 x 8 mm (image 20/15). There is mild enhancement of this lesion (image 47/series 24.) Stomach/Bowel: Stomach and limited view of the small bowel is unremarkable. Limited view of the colon is unremarkable Vascular/Lymphatic: Abdominal aorta normal caliber. No upper abdominal adenopathy. Other: No free fluid. Musculoskeletal: Enhancing lesion the posterior LEFT rib (image 20/24) IMPRESSION: 1. No  evidence of choledocholithiasis.  No biliary obstruction. 2. Circumferential thickening of the gallbladder fundus without enhancement. Nonspecific finding. Favor chronic inflammation. Cannot exclude gallbladder metastasis however non enhancement would weigh against metastasis. 3. New retroperitoneal nodule medial to the LEFT kidney is concerning for lung cancer metastasis. Consider nonemergent outpatient FDG PET scan for further characterization (gallbladder could be assessed at same time). 4. 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 Electronically Signed   By: SSuzy BouchardM.D.   On: 10/20/2020 09:42   NM PET Image Restag (PS) Skull Base To Thigh  Result Date: 11/06/2020 CLINICAL DATA:  Subsequent treatment strategy for non-small cell lung cancer. Suspicion of recurrence. EXAM: NUCLEAR MEDICINE PET SKULL BASE TO THIGH TECHNIQUE: 7.4 mCi F-18 FDG was injected intravenously. Full-ring PET imaging was performed from the skull base to thigh after the radiotracer. CT data was obtained and used for attenuation correction and anatomic localization. Fasting blood glucose: 87 mg/dl COMPARISON:  PET-CT 08/29/2019, CTs of the chest, abdomen and pelvis 10/19/2020 and abdominal MRI 10/20/2020. FINDINGS: Mediastinal blood pool activity: SUV max 1.7 NECK: There is a new hypermetabolic nodule posterior to the right angle of the mandible, measuring 1.8 cm on image 36/4 (SUV max 12.2). This is likely a lymph node. There is asymmetric hypermetabolic activity within the left oropharynx (SUV max 7.8) without clear corresponding mucosal abnormality on the CT images. There is asymmetrically increased activity within the right local cord which may relate to left vocal cord paralysis. Incidental CT findings: Bilateral carotid atherosclerosis. CHEST: There are multiple hypermetabolic mediastinal and hilar lymph nodes bilaterally. Subcarinal nodal mass has an SUV max of  14.7. Lymph  nodes in the right hilum have an SUV max of 10.5, and in the left hilum 14.2. There are multiple hypermetabolic pulmonary nodules bilaterally. There is a central infrahilar right lower lobe nodule with an SUV max of 10.9. There is an approximately 1.6 cm nodule anteriorly in the left upper lobe which is hypermetabolic (SUV max 8.8). Other small hypermetabolic central left lung nodules are present. There is new patchy right upper lobe airspace disease with associated low level hypermetabolic activity, likely inflammatory/infectious. There are small foci of hypermetabolic activity within the left pleural space, suspicious for pleural metastases. The most hypermetabolic nodule projects over the left hemidiaphragm and has an SUV max of 9.2. This is not well localized on the CT images and could be subdiaphragmatic. No suspicious for activity on the right. Incidental CT findings: Small bilateral pleural effusions. As above, new patchy airspace disease in the right upper lobe, likely inflammatory/infectious. Right IJ Port-A-Cath extends to the superior cavoatrial junction. ABDOMEN/PELVIS: There is no hypermetabolic activity within the liver, adrenal glands, spleen or pancreas. However, there is intense hypermetabolic activity associated with the gallbladder (SUV max 25.0). As above, there is focal hypermetabolic activity near the left hemidiaphragm which does not localize well on the CT images, probably a pleural-based metastasis. There is a hypermetabolic soft tissue nodule medial to the left kidney, measuring 2.3 cm on image 135/4 (SUV max 15.5). There is irregular wall thickening of the cecum laterally with associated hypermetabolic activity. This has an SUV max of 19.5 and could reflect a peritoneal implant or primary colon cancer. Incidental CT findings: Stable small left adrenal nodule consistent with an adenoma. Aortic and branch vessel atherosclerosis. SKELETON: Widespread hypermetabolic osseous  metastatic disease. There are hypermetabolic lesions within the left frontal parietal and occipital skull. There are bilateral scapular lesions, larger on the right (SUV max 22.0). There are bilateral rib lesions. There are multifocal lesions within the spine, most prominent in the L5 vertebral body (SUV max 9.6, with associated pathologic fracture), and the at the right T5 and T7 costovertebral junctions. There are large lytic hypermetabolic lesions within the pelvis bilaterally involving both iliac bones, including the left acetabulum and left pubic rami. Representative lytic lesions include a 3.0 cm posteriorly in the right iliac bone on image 175/4 and a 3.3 cm lesion in the left iliac bone on image 169/4. Incidental CT findings: none IMPRESSION: 1. Widespread hypermetabolic metastases as described. Sites of involvement include right retromalleolar, mediastinal and bilateral hilar lymph nodes; bilateral pulmonary nodules; the left pleural space; a retroperitoneal nodule medial to the left kidney; the gallbladder; and multiple osseous metastases as detailed above. 2. In addition, there is focal hypermetabolic activity along the lateral aspect of the cecum associated with focal wall thickening which could reflect a peritoneal implant or primary cecal carcinoma. No evidence of bowel obstruction or perforation. 3. New patchy right upper lobe airspace disease with hypermetabolic activity, likely inflammatory/infectious. Electronically Signed   By: Richardean Sale M.D.   On: 11/06/2020 16:56   MR ABDOMEN MRCP W WO CONTAST  Result Date: 10/20/2020 CLINICAL DATA:  Cholelithiasis.  Enhancing gallbladder wall on CT. EXAM: MRI ABDOMEN WITHOUT AND WITH CONTRAST (INCLUDING MRCP) TECHNIQUE: Multiplanar multisequence MR imaging of the abdomen was performed both before and after the administration of intravenous contrast. Heavily T2-weighted images of the biliary and pancreatic ducts were obtained, and three-dimensional  MRCP images were rendered by post processing. CONTRAST:  83m GADAVIST GADOBUTROL 1 MMOL/ML IV SOLN COMPARISON:  CT 10/19/2020, 07/13/2020 PET-CT 08/29/2019  FINDINGS: Lower chest: Lung bases are clear. Hepatobiliary: No focal hepatic lesion. There is gallbladder wall thickening involving the fundus of the gallbladder best seen on image 15/3 with thickening of single wall up to 10 mm. On postcontrast imaging there is no apparent enhancement of this of thickened gallbladder fundus. There is no intrahepatic biliary duct dilatation. The common bile duct normal caliber. No filling defect within the common bile duct. Pancreas: Pancreas is normal. No ductal dilatation. No pancreatic inflammation. Spleen: Normal spleen Adrenals/urinary tract: Adrenal glands normal. Nonenhancing cysts of the RIGHT kidney. Medial to the LEFT kidney there is an oblong lesion which is hypointense on T2 weighted imaging measuring 16 x 8 mm (image 20/15). There is mild enhancement of this lesion (image 47/series 24.) Stomach/Bowel: Stomach and limited view of the small bowel is unremarkable. Limited view of the colon is unremarkable Vascular/Lymphatic: Abdominal aorta normal caliber. No upper abdominal adenopathy. Other: No free fluid. Musculoskeletal: Enhancing lesion the posterior LEFT rib (image 20/24) IMPRESSION: 1. No evidence of choledocholithiasis.  No biliary obstruction. 2. Circumferential thickening of the gallbladder fundus without enhancement. Nonspecific finding. Favor chronic inflammation. Cannot exclude gallbladder metastasis however non enhancement would weigh against metastasis. 3. New retroperitoneal nodule medial to the LEFT kidney is concerning for lung cancer metastasis. Consider nonemergent outpatient FDG PET scan for further characterization (gallbladder could be assessed at same time). 4. 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 Electronically Signed   By: Suzy Bouchard M.D.   On: 10/20/2020 09:42   DG Hip Unilat W or Wo Pelvis 2-3 Views Left  Result Date: 10/19/2020 CLINICAL DATA:  Severe hip pain EXAM: DG HIP (WITH OR WITHOUT PELVIS) 2-3V LEFT COMPARISON:  CT 05/11/2020, pelvis radiograph 10/18/2020 FINDINGS: SI joints are patent. Pubic symphysis and rami appear intact. No fracture or malalignment. The joint space appears maintained IMPRESSION: Negative. Electronically Signed   By: Donavan Foil M.D.   On: 10/19/2020 17:02   US Abdomen Limited RUQ (LIVER/GB)  Result Date: 10/20/2020 CLINICAL DATA:  Elevated LFTs EXAM: ULTRASOUND ABDOMEN LIMITED RIGHT UPPER QUADRANT COMPARISON:  CT 10/19/2020 FINDINGS: Gallbladder: Contracted gallbladder with wall thickening. Similar to CT same day. Common bile duct: Diameter: Normal diameter at 5 mm Liver: Normal liver parenchyma. No duct dilatation. Portal vein is patent on color Doppler imaging with normal direction of blood flow towards the liver. Other: No free fluid IMPRESSION: 1. Contracted gallbladder with thickened wall. Findings similar to comparison CT same day. 2. Negative sonographic Murphy's sign. 3. No biliary duct dilatation. Electronically Signed   By: Suzy Bouchard M.D.   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 He underwent a course of concurrent chemoradiation to the lung as well as the epiglottic area under the care of Dr. Tammi Klippel. He is status post 6 cycle. He tolerated this course of treatment well except for the radiation induced esophagitis as well as weight loss and fatigue. The patient  had partial response to the previous treatment. He completed treatment with Keytruda 200 mg IV every 3 weeks status post 35 cycles. The patient has been  on observation for several months and has been doing well except for recent bone metastasis in the left fifth rib which was treated with radiotherapy. The patient had a PET scan and MRI of the brain performed recently.  I personally and independently reviewed the scan images and discussed the result and showed the images to the patient and his wife. Unfortunately his PET scan showed widespread hypermetabolic metastasis involving the right retromolleolar, mediastinal and bilateral hilar lymph nodes, bilateral pulmonary nodules as well as left pleural space and retroperitoneal nodule medial to the left kidney, the gallbladder and multiple osseous metastasis.  In addition there is a focal hypermetabolic activity along the lateral aspect of the cecum associated with focal wall thickening which could reflect retroperitoneal implant or primary cecal carcinoma but no evidence of bowel obstruction or perforation.  The MRI of the brain showed central necrotic enhancing left frontal calvarial lesion measuring 4.3 x 3.2 cm with underlying dural thickening.  There was also abnormal enhancing tissue measuring 2.8 x 2 point centimeters involving the left temporal foss/temporalis muscle with underlying dural thickening and enhancement as part of multifocal calvarial lesions with underlying dural metastasis involving the left frontal, midline parietal and left temporal regions.  There was also multifocal left cerebellar restricted diffusion concerning for acute/subacute infarcts versus evolving metastatic lesions. I had a lengthy discussion with the patient about his current condition and treatment options.  I gave the patient the option of palliative care and hospice referral versus consideration of palliative systemic chemotherapy with carboplatin for AUC of 5, paclitaxel  175 mg/M2 and Keytruda 200 mg IV every 3 weeks with Neulasta support. I discussed with the patient the adverse effects of this treatment including but not limited to alopecia, myelosuppression, nausea and vomiting, peripheral neuropathy, liver or renal dysfunction as well as the adverse effect of the immunotherapy. The patient would like some time to think about his option with his family.  If he decides to proceed with the treatment we expect him to receive the first dose of this treatment on November 16, 2020. For pain management I will increase his dose of fentanyl patch to 75 mcg/hour every 3 days and he will continue on the oxycodone for breakthrough pain.  He will also receive palliative radiotherapy from Dr. Tammi Klippel to the painful bone lesions. For the hypercalcemia of malignancy, I will arrange for the patient to receive Zometa infusion today with IV hydration. The patient will come back for follow-up visit in 2 weeks for evaluation and management of any adverse effect of his treatment. For the suspicious brain abnormality, this is likely metastasis but I I discussed the results with Dr. Mickeal Skinner and he recommended starting the patient on aspirin for now.  He will try to have an appointment for the patient with him in 1 months for evaluation and repeat imaging studies. The patient was advised to call immediately if he has any other concerning symptoms in the interval. The patient voices understanding of current disease status and treatment options and is in agreement with the current care plan. All questions were answered. The patient knows to call the clinic with any problems, questions or concerns. We can certainly see the patient much sooner if necessary. Disclaimer: This note was dictated with voice recognition software. Similar sounding words can inadvertently be transcribed and may not be corrected upon review.

## 2020-11-07 NOTE — Patient Instructions (Signed)
Zoledronic Acid injection (Hypercalcemia, Oncology) What is this medicine? ZOLEDRONIC ACID (ZOE le dron ik AS id) lowers the amount of calcium loss from bone. It is used to treat too much calcium in your blood from cancer. It is also used to prevent complications of cancer that has spread to the bone. This medicine may be used for other purposes; ask your health care provider or pharmacist if you have questions. COMMON BRAND NAME(S): Zometa What should I tell my health care provider before I take this medicine? They need to know if you have any of these conditions:  aspirin-sensitive asthma  cancer, especially if you are receiving medicines used to treat cancer  dental disease or wear dentures  infection  kidney disease  receiving corticosteroids like dexamethasone or prednisone  an unusual or allergic reaction to zoledronic acid, other medicines, foods, dyes, or preservatives  pregnant or trying to get pregnant  breast-feeding How should I use this medicine? This medicine is for infusion into a vein. It is given by a health care professional in a hospital or clinic setting. Talk to your pediatrician regarding the use of this medicine in children. Special care may be needed. Overdosage: If you think you have taken too much of this medicine contact a poison control center or emergency room at once. NOTE: This medicine is only for you. Do not share this medicine with others. What if I miss a dose? It is important not to miss your dose. Call your doctor or health care professional if you are unable to keep an appointment. What may interact with this medicine?  certain antibiotics given by injection  NSAIDs, medicines for pain and inflammation, like ibuprofen or naproxen  some diuretics like bumetanide, furosemide  teriparatide  thalidomide This list may not describe all possible interactions. Give your health care provider a list of all the medicines, herbs, non-prescription  drugs, or dietary supplements you use. Also tell them if you smoke, drink alcohol, or use illegal drugs. Some items may interact with your medicine. What should I watch for while using this medicine? Visit your doctor or health care professional for regular checkups. It may be some time before you see the benefit from this medicine. Do not stop taking your medicine unless your doctor tells you to. Your doctor may order blood tests or other tests to see how you are doing. Women should inform their doctor if they wish to become pregnant or think they might be pregnant. There is a potential for serious side effects to an unborn child. Talk to your health care professional or pharmacist for more information. You should make sure that you get enough calcium and vitamin D while you are taking this medicine. Discuss the foods you eat and the vitamins you take with your health care professional. Some people who take this medicine have severe bone, joint, and/or muscle pain. This medicine may also increase your risk for jaw problems or a broken thigh bone. Tell your doctor right away if you have severe pain in your jaw, bones, joints, or muscles. Tell your doctor if you have any pain that does not go away or that gets worse. Tell your dentist and dental surgeon that you are taking this medicine. You should not have major dental surgery while on this medicine. See your dentist to have a dental exam and fix any dental problems before starting this medicine. Take good care of your teeth while on this medicine. Make sure you see your dentist for regular follow-up  appointments. What side effects may I notice from receiving this medicine? Side effects that you should report to your doctor or health care professional as soon as possible:  allergic reactions like skin rash, itching or hives, swelling of the face, lips, or tongue  anxiety, confusion, or depression  breathing problems  changes in vision  eye  pain  feeling faint or lightheaded, falls  jaw pain, especially after dental work  mouth sores  muscle cramps, stiffness, or weakness  redness, blistering, peeling or loosening of the skin, including inside the mouth  trouble passing urine or change in the amount of urine Side effects that usually do not require medical attention (report to your doctor or health care professional if they continue or are bothersome):  bone, joint, or muscle pain  constipation  diarrhea  fever  hair loss  irritation at site where injected  loss of appetite  nausea, vomiting  stomach upset  trouble sleeping  trouble swallowing  weak or tired This list may not describe all possible side effects. Call your doctor for medical advice about side effects. You may report side effects to FDA at 1-800-FDA-1088. Where should I keep my medicine? This drug is given in a hospital or clinic and will not be stored at home. NOTE: This sheet is a summary. It may not cover all possible information. If you have questions about this medicine, talk to your doctor, pharmacist, or health care provider.  2020 Elsevier/Gold Standard (2014-03-25 14:19:39)  Rehydration, Adult Rehydration is the replacement of body fluids and salts and minerals (electrolytes) that are lost during dehydration. Dehydration is when there is not enough fluid or water in the body. This happens when you lose more fluids than you take in. Common causes of dehydration include:  Vomiting.  Diarrhea.  Excessive sweating, such as from heat exposure or exercise.  Taking medicines that cause the body to lose excess fluid (diuretics).  Impaired kidney function.  Not drinking enough fluid.  Certain illnesses or infections.  Certain poorly controlled long-term (chronic) illnesses, such as diabetes, heart disease, and kidney disease.  Symptoms of mild dehydration may include thirst, dry lips and mouth, dry skin, and dizziness.  Symptoms of severe dehydration may include increased heart rate, confusion, fainting, and not urinating. You can rehydrate by drinking certain fluids or getting fluids through an IV tube, as told by your health care provider. What are the risks? Generally, rehydration is safe. However, one problem that can happen is taking in too much fluid (overhydration). This is rare. If overhydration happens, it can cause an electrolyte imbalance, kidney failure, or a decrease in salt (sodium) levels in the body. How to rehydrate Follow instructions from your health care provider for rehydration. The kind of fluid you should drink and the amount you should drink depend on your condition.  If directed by your health care provider, drink an oral rehydration solution (ORS). This is a drink designed to treat dehydration that is found in pharmacies and retail stores. ? Make an ORS by following instructions on the package. ? Start by drinking small amounts, about  cup (120 mL) every 5-10 minutes. ? Slowly increase how much you drink until you have taken the amount recommended by your health care provider.  Drink enough clear fluids to keep your urine clear or pale yellow. If you were instructed to drink an ORS, finish the ORS first, then start slowly drinking other clear fluids. Drink fluids such as: ? Water. Do not drink only water.  Doing that can lead to having too little sodium in your body (hyponatremia). ? Ice chips. ? Fruit juice that you have added water to (diluted juice). ? Low-calorie sports drinks.  If you are severely dehydrated, your health care provider may recommend that you receive fluids through an IV tube in the hospital.  Do not take sodium tablets. Doing that can lead to the condition of having too much sodium in your body (hypernatremia). Eating while you rehydrate Follow instructions from your health care provider about what to eat while you rehydrate. Your health care provider may  recommend that you slowly begin eating regular foods in small amounts.  Eat foods that contain a healthy balance of electrolytes, such as bananas, oranges, potatoes, tomatoes, and spinach.  Avoid foods that are greasy or contain a lot of fat or sugar.  In some cases, you may get nutrition through a feeding tube that is passed through your nose and into your stomach (nasogastric tube, or NG tube). This may be done if you have uncontrolled vomiting or diarrhea. Beverages to avoid Certain beverages may make dehydration worse. While you rehydrate, avoid:  Alcohol.  Caffeine.  Drinks that contain a lot of sugar. These include: ? High-calorie sports drinks. ? Fruit juice that is not diluted. ? Soda.  Check nutrition labels to see how much sugar or caffeine a beverage contains. Signs of dehydration recovery You may be recovering from dehydration if:  You are urinating more often than before you started rehydrating.  Your urine is clear or pale yellow.  Your energy level improves.  You vomit less frequently.  You have diarrhea less frequently.  Your appetite improves or returns to normal.  You feel less dizzy or less light-headed.  Your skin tone and color start to look more normal. Contact a health care provider if:  You continue to have symptoms of mild dehydration, such as: ? Thirst. ? Dry lips. ? Slightly dry mouth. ? Dry, warm skin. ? Dizziness.  You continue to vomit or have diarrhea. Get help right away if:  You have symptoms of dehydration that get worse.  You feel: ? Confused. ? Weak. ? Like you are going to faint.  You have not urinated in 6-8 hours.  You have very dark urine.  You have trouble breathing.  Your heart rate while sitting still is over 100 beats a minute.  You cannot drink fluids without vomiting.  You have vomiting or diarrhea that: ? Gets worse. ? Does not go away.  You have a fever. This information is not intended to replace  advice given to you by your health care provider. Make sure you discuss any questions you have with your health care provider. Document Revised: 10/09/2017 Document Reviewed: 12/21/2015 Elsevier Patient Education  2020 [employer].

## 2020-11-08 ENCOUNTER — Ambulatory Visit
Admission: RE | Admit: 2020-11-08 | Discharge: 2020-11-08 | Disposition: A | Discharge status: Home / Self Care | Payer: 59 | Source: Ambulatory Visit | Attending: Radiation Oncology | Admitting: Radiation Oncology

## 2020-11-08 ENCOUNTER — Other Ambulatory Visit: Payer: Self-pay | Admitting: Radiation Oncology

## 2020-11-08 DIAGNOSIS — C7951 Secondary malignant neoplasm of bone: Secondary | ICD-10-CM | POA: Diagnosis not present

## 2020-11-08 MED ORDER — PROCHLORPERAZINE MALEATE 10 MG PO TABS: 10.0000 mg | ORAL_TABLET | Freq: Four times a day (QID) | ORAL | 5 refills | Status: DC | PRN

## 2020-11-12 ENCOUNTER — Other Ambulatory Visit: Payer: Self-pay | Admitting: Internal Medicine

## 2020-11-12 ENCOUNTER — Telehealth: Payer: Self-pay

## 2020-11-12 ENCOUNTER — Ambulatory Visit
Admission: RE | Admit: 2020-11-12 | Discharge: 2020-11-12 | Disposition: A | Discharge status: Home / Self Care | Payer: 59 | Source: Ambulatory Visit | Attending: Radiation Oncology | Admitting: Radiation Oncology

## 2020-11-12 ENCOUNTER — Telehealth: Payer: Self-pay | Admitting: *Deleted

## 2020-11-12 DIAGNOSIS — C7951 Secondary malignant neoplasm of bone: Secondary | ICD-10-CM | POA: Insufficient documentation

## 2020-11-12 MED ORDER — OXYCODONE HCL 10 MG PO TABS: 10.0000 mg | ORAL_TABLET | ORAL | 0 refills | Status: DC | PRN

## 2020-11-12 MED ORDER — FENTANYL 75 MCG/HR TD PT72: 1.0000 | MEDICATED_PATCH | TRANSDERMAL | 0 refills | Status: DC

## 2020-11-12 NOTE — Telephone Encounter (Signed)
Pt requesting Fentanyl and Oxycodone

## 2020-11-12 NOTE — Telephone Encounter (Signed)
I received a vm message from Mr. Trevor Zavala's wife.  She was asking about his upcoming schedule. I contacted scheduling team and they are working on appts for him. I called her back with an update.  She was thankful for the call and update.

## 2020-11-13 ENCOUNTER — Encounter: Payer: Self-pay | Admitting: Radiation Oncology

## 2020-11-13 ENCOUNTER — Other Ambulatory Visit: Payer: Self-pay

## 2020-11-13 ENCOUNTER — Ambulatory Visit
Admission: RE | Admit: 2020-11-13 | Discharge: 2020-11-13 | Disposition: A | Discharge status: Home / Self Care | Payer: 59 | Source: Ambulatory Visit | Attending: Radiation Oncology | Admitting: Radiation Oncology

## 2020-11-13 DIAGNOSIS — C7951 Secondary malignant neoplasm of bone: Secondary | ICD-10-CM | POA: Diagnosis not present

## 2020-11-14 ENCOUNTER — Telehealth: Payer: Self-pay | Admitting: Internal Medicine

## 2020-11-14 NOTE — Telephone Encounter (Signed)
Scheduled appt per 12/29 sch msg - left message for pt with appt date and time

## 2020-11-16 ENCOUNTER — Ambulatory Visit (HOSPITAL_COMMUNITY): Payer: 59

## 2020-11-16 ENCOUNTER — Inpatient Hospital Stay: Payer: 59

## 2020-11-16 ENCOUNTER — Inpatient Hospital Stay: Payer: 59 | Attending: Internal Medicine

## 2020-11-16 ENCOUNTER — Other Ambulatory Visit: Payer: Self-pay

## 2020-11-16 DIAGNOSIS — C3412 Malignant neoplasm of upper lobe, left bronchus or lung: Secondary | ICD-10-CM | POA: Insufficient documentation

## 2020-11-16 DIAGNOSIS — Z79899 Other long term (current) drug therapy: Secondary | ICD-10-CM | POA: Insufficient documentation

## 2020-11-16 DIAGNOSIS — G893 Neoplasm related pain (acute) (chronic): Secondary | ICD-10-CM | POA: Diagnosis not present

## 2020-11-16 DIAGNOSIS — R634 Abnormal weight loss: Secondary | ICD-10-CM | POA: Diagnosis not present

## 2020-11-16 DIAGNOSIS — Z5111 Encounter for antineoplastic chemotherapy: Secondary | ICD-10-CM | POA: Insufficient documentation

## 2020-11-16 DIAGNOSIS — R5383 Other fatigue: Secondary | ICD-10-CM | POA: Diagnosis not present

## 2020-11-16 DIAGNOSIS — C7951 Secondary malignant neoplasm of bone: Secondary | ICD-10-CM | POA: Diagnosis not present

## 2020-11-16 DIAGNOSIS — C321 Malignant neoplasm of supraglottis: Secondary | ICD-10-CM | POA: Diagnosis not present

## 2020-11-16 DIAGNOSIS — Z5189 Encounter for other specified aftercare: Secondary | ICD-10-CM | POA: Diagnosis not present

## 2020-11-16 DIAGNOSIS — J449 Chronic obstructive pulmonary disease, unspecified: Secondary | ICD-10-CM | POA: Diagnosis not present

## 2020-11-16 DIAGNOSIS — C781 Secondary malignant neoplasm of mediastinum: Secondary | ICD-10-CM | POA: Diagnosis not present

## 2020-11-16 DIAGNOSIS — Z5112 Encounter for antineoplastic immunotherapy: Secondary | ICD-10-CM | POA: Insufficient documentation

## 2020-11-16 LAB — CMP (CANCER CENTER ONLY)
ALT: 35 U/L (ref 0–44)
AST: 19 U/L (ref 15–41)
Albumin: 2.9 g/dL — ABNORMAL LOW (ref 3.5–5.0)
Alkaline Phosphatase: 112 U/L (ref 38–126)
Anion gap: 9 (ref 5–15)
BUN: 8 mg/dL (ref 6–20)
CO2: 27 mmol/L (ref 22–32)
Calcium: 11.6 mg/dL — ABNORMAL HIGH (ref 8.9–10.3)
Chloride: 98 mmol/L (ref 98–111)
Creatinine: 0.67 mg/dL (ref 0.61–1.24)
GFR, Estimated: >60 mL/min (ref >60)
Glucose, Bld: 159 mg/dL — ABNORMAL HIGH (ref 70–99)
Potassium: 3.8 mmol/L (ref 3.5–5.1)
Sodium: 134 mmol/L — ABNORMAL LOW (ref 135–145)
Total Bilirubin: 0.3 mg/dL (ref 0.3–1.2)
Total Protein: 6.8 g/dL (ref 6.5–8.1)

## 2020-11-16 LAB — CBC WITH DIFFERENTIAL (CANCER CENTER ONLY)
Abs Immature Granulocytes: 0.04 10*3/uL (ref 0.00–0.07)
Basophils Absolute: 0 10*3/uL (ref 0.0–0.1)
Basophils Relative: 1 %
Eosinophils Absolute: 0.1 10*3/uL (ref 0.0–0.5)
Eosinophils Relative: 2 %
HCT: 32.7 % — ABNORMAL LOW (ref 39.0–52.0)
Hemoglobin: 10.8 g/dL — ABNORMAL LOW (ref 13.0–17.0)
Immature Granulocytes: 1 %
Lymphocytes Relative: 4 %
Lymphs Abs: 0.3 10*3/uL — ABNORMAL LOW (ref 0.7–4.0)
MCH: 29.2 pg (ref 26.0–34.0)
MCHC: 33 g/dL (ref 30.0–36.0)
MCV: 88.4 fL (ref 80.0–100.0)
Monocytes Absolute: 0.8 10*3/uL (ref 0.1–1.0)
Monocytes Relative: 10 %
Neutro Abs: 6.2 10*3/uL (ref 1.7–7.7)
Neutrophils Relative %: 82 %
Platelet Count: 393 10*3/uL (ref 150–400)
RBC: 3.7 MIL/uL — ABNORMAL LOW (ref 4.22–5.81)
RDW: 13 % (ref 11.5–15.5)
WBC Count: 7.4 10*3/uL (ref 4.0–10.5)
nRBC: 0 % (ref 0.0–0.2)

## 2020-11-16 LAB — TSH: TSH: 3.888 u[IU]/mL (ref 0.320–4.118)

## 2020-11-17 ENCOUNTER — Inpatient Hospital Stay: Payer: 59

## 2020-11-17 VITALS — BP 85/65 | HR 84 | Temp 97.6°F | Resp 18

## 2020-11-17 DIAGNOSIS — Z95828 Presence of other vascular implants and grafts: Secondary | ICD-10-CM

## 2020-11-17 DIAGNOSIS — Z5111 Encounter for antineoplastic chemotherapy: Secondary | ICD-10-CM | POA: Diagnosis not present

## 2020-11-17 DIAGNOSIS — C321 Malignant neoplasm of supraglottis: Secondary | ICD-10-CM

## 2020-11-17 DIAGNOSIS — C3412 Malignant neoplasm of upper lobe, left bronchus or lung: Secondary | ICD-10-CM

## 2020-11-17 MED ORDER — SODIUM CHLORIDE 0.9% FLUSH
10.0000 mL | Freq: Once | INTRAVENOUS | Status: AC
Start: 2020-11-17 — End: 2020-11-17
  Administered 2020-11-17: 10 mL
  Filled 2020-11-17: qty 10

## 2020-11-17 MED ORDER — SODIUM CHLORIDE 0.9% FLUSH
10.0000 mL | INTRAVENOUS | Status: DC | PRN
  Filled 2020-11-17: qty 10

## 2020-11-17 MED ORDER — DIPHENHYDRAMINE HCL 50 MG/ML IJ SOLN
50.0000 mg | Freq: Once | INTRAMUSCULAR | Status: AC
  Administered 2020-11-17: 50 mg via INTRAVENOUS

## 2020-11-17 MED ORDER — SODIUM CHLORIDE 0.9 % IV SOLN
150.0000 mg | Freq: Once | INTRAVENOUS | Status: AC
  Administered 2020-11-17: 150 mg via INTRAVENOUS
  Filled 2020-11-17: qty 150

## 2020-11-17 MED ORDER — PALONOSETRON HCL INJECTION 0.25 MG/5ML
0.2500 mg | Freq: Once | INTRAVENOUS | Status: AC
  Administered 2020-11-17: 0.25 mg via INTRAVENOUS

## 2020-11-17 MED ORDER — SODIUM CHLORIDE 0.9 % IV SOLN
543.5000 mg | Freq: Once | INTRAVENOUS | Status: AC
  Administered 2020-11-17: 540 mg via INTRAVENOUS
  Filled 2020-11-17: qty 54

## 2020-11-17 MED ORDER — SODIUM CHLORIDE 0.9 % IV SOLN
200.0000 mg | Freq: Once | INTRAVENOUS | Status: AC
  Administered 2020-11-17: 200 mg via INTRAVENOUS
  Filled 2020-11-17: qty 8

## 2020-11-17 MED ORDER — SODIUM CHLORIDE 0.9 % IV SOLN
175.0000 mg/m2 | Freq: Once | INTRAVENOUS | Status: AC
  Administered 2020-11-17: 294 mg via INTRAVENOUS
  Filled 2020-11-17: qty 49

## 2020-11-17 MED ORDER — HEPARIN SOD (PORK) LOCK FLUSH 100 UNIT/ML IV SOLN
500.0000 [IU] | Freq: Once | INTRAVENOUS | Status: AC | PRN
  Administered 2020-11-17: 500 [IU]
  Filled 2020-11-17: qty 5

## 2020-11-17 MED ORDER — SODIUM CHLORIDE 0.9 % IV SOLN
10.0000 mg | Freq: Once | INTRAVENOUS | Status: AC
  Administered 2020-11-17: 10 mg via INTRAVENOUS
  Filled 2020-11-17: qty 10

## 2020-11-17 MED ORDER — SODIUM CHLORIDE 0.9 % IV SOLN
Freq: Once | INTRAVENOUS | Status: AC
Start: 2020-11-17 — End: 2020-11-17
  Filled 2020-11-17: qty 250

## 2020-11-17 MED ORDER — FAMOTIDINE IN NACL 20-0.9 MG/50ML-% IV SOLN
20.0000 mg | Freq: Once | INTRAVENOUS | Status: AC
  Administered 2020-11-17: 20 mg via INTRAVENOUS

## 2020-11-17 NOTE — Patient Instructions (Signed)
[facility] Discharge Instructions for Patients Receiving Chemotherapy  Today you received the following chemotherapy agents:  Keytruda, TAxol, Carboplatin  To help prevent nausea and vomiting after your treatment, we encourage you to take your nausea medication as prescribed.   If you develop nausea and vomiting that is not controlled by your nausea medication, call the clinic.   BELOW ARE SYMPTOMS THAT SHOULD BE REPORTED IMMEDIATELY:  *FEVER GREATER THAN 100.5 F  *CHILLS WITH OR WITHOUT FEVER  NAUSEA AND VOMITING THAT IS NOT CONTROLLED WITH YOUR NAUSEA MEDICATION  *UNUSUAL SHORTNESS OF BREATH  *UNUSUAL BRUISING OR BLEEDING  TENDERNESS IN MOUTH AND THROAT WITH OR WITHOUT PRESENCE OF ULCERS  *URINARY PROBLEMS  *BOWEL PROBLEMS  UNUSUAL RASH Items with * indicate a potential emergency and should be followed up as soon as possible.  Feel free to call the clinic should you have any questions or concerns. The clinic phone number is [phone].  Please show the [facility] at check-in to the Emergency Department and triage nurse.

## 2020-11-17 NOTE — Progress Notes (Signed)
Pt discharged to home w/ family mbr.  A/O, VSS

## 2020-11-19 ENCOUNTER — Inpatient Hospital Stay: Payer: 59 | Admitting: Internal Medicine

## 2020-11-19 ENCOUNTER — Other Ambulatory Visit: Payer: Self-pay

## 2020-11-19 ENCOUNTER — Inpatient Hospital Stay: Payer: 59

## 2020-11-19 ENCOUNTER — Other Ambulatory Visit: Payer: Self-pay | Admitting: Medical Oncology

## 2020-11-19 VITALS — BP 72/52 | HR 68 | Temp 97.9°F | Resp 20

## 2020-11-19 DIAGNOSIS — I959 Hypotension, unspecified: Secondary | ICD-10-CM

## 2020-11-19 DIAGNOSIS — C3412 Malignant neoplasm of upper lobe, left bronchus or lung: Secondary | ICD-10-CM

## 2020-11-19 DIAGNOSIS — Z5111 Encounter for antineoplastic chemotherapy: Secondary | ICD-10-CM | POA: Diagnosis not present

## 2020-11-19 MED ORDER — PEGFILGRASTIM-BMEZ 6 MG/0.6ML ~~LOC~~ SOSY
PREFILLED_SYRINGE | SUBCUTANEOUS | Status: AC
  Filled 2020-11-19: qty 0.6

## 2020-11-19 MED ORDER — PEGFILGRASTIM-BMEZ 6 MG/0.6ML ~~LOC~~ SOSY
6.0000 mg | PREFILLED_SYRINGE | Freq: Once | SUBCUTANEOUS | Status: AC
  Administered 2020-11-19: 6 mg via SUBCUTANEOUS

## 2020-11-19 MED ORDER — HEPARIN SOD (PORK) LOCK FLUSH 100 UNIT/ML IV SOLN
500.0000 [IU] | Freq: Once | INTRAVENOUS | Status: AC
  Administered 2020-11-19: 500 [IU] via INTRAVENOUS
  Filled 2020-11-19: qty 5

## 2020-11-19 MED ORDER — SODIUM CHLORIDE 0.9% FLUSH
10.0000 mL | Freq: Once | INTRAVENOUS | Status: AC
  Administered 2020-11-19: 10 mL via INTRAVENOUS
  Filled 2020-11-19: qty 10

## 2020-11-19 MED ORDER — SODIUM CHLORIDE 0.9 % IV SOLN
Freq: Once | INTRAVENOUS | Status: AC
  Filled 2020-11-19: qty 250

## 2020-11-19 NOTE — Progress Notes (Signed)
[facility] OFFICE PROGRESS NOTE  Zavala, Trevor Quint, MD [address] [place] [address]  DIAGNOSIS:  1) Stage IV (T3, N3, M1a) non-small cell lung cancer, squamous cell carcinoma presented with large left hilar mass in addition to mediastinal and left supraclavicular lymphadenopathy as well as contralateral right upper lobe nodule diagnosed in August 2018.  He has disease recurrence in December 2021 with Widespread hypermetabolic metastases as described. Sites of involvement include right retromalleolar, mediastinal and bilateral hilar lymph nodes; bilateral pulmonary nodules; the left pleural space; a retroperitoneal nodule medial to the left kidney; the gallbladder; and multiple osseous metastases. there is focal hypermetabolic activity along the lateral aspect of the cecum associated with focal wall thickening which could reflect a peritoneal implant or primary cecal carcinoma. There was also multifocal calvarial lesions with underlying dural metastases involving the left frontal, midline parietal and left temporal regions. PDL 1 expression: 90%. 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 First dose 06/29/2017. Status post 6 cycles. Last dose was given 08/03/2017. 2)  Immunotherapy with Ketruda 200 MG IV every 3 weeks, first dose 09/17/2017.  Status post 35 cycles.  Last dose was September 01, 2019. 3) palliative radiotherapy to the painful lesions in the left fifth rib under the care of Dr. Kathrynn Running in October 2021.  4) palliative radiotherapy to the left hip, L5, and skull lesions under the care of Dr. Kathrynn Running. Last treatment on 11/13/20.  CURRENT THERAPY:  1) Systemic chemotherapy with carboplatin for AUC of 5, paclitaxel 175 mg/M2 and Keytruda 200 mg IV every 3 weeks with Neulasta support.  First dose November 16, 2020. Status post 1  cycle.  2) Xgeva 120 mg monthly starting on 12/06/20.  INTERVAL HISTORY: Trevor Zavala 55 y.o. male returns to the clinic today for a follow up visit accompanied by his wife. The patient is feeling "pretty good" today. The patient recently was found to have evidence of widespread metastatic disease. He was admitted to [facility] from 12/10-12/12/21 after presenting to the emergency room from the cancer center with the chief complaint of fatigue, weakness, imbalance, and lab abnormalities. Labs demonstrated hypercalcemia and hypokalemia. He had a restaging CT of the chest, abdomen, and pelvis which showed widespread disease. He was treated with IV hydration, lasix, and zometa.  After discharge, he followed up at the cancer center and an order for a PET and MRI was placed. His imaging showed skull metastatic lesions and Dr. Arbutus Ped discussed the results with Dr. Barbaraann Cao, neuro-oncologist, for suspicious brain metastases. He continued to have uncontrolled pain and was started on fentanyl patches and oxycodone for breakthrough pain. He takes 10 mg oxycodone every 5-6 hours. His fentanyl dose is presently 75 mcg. He notes that the pain medication takes his pain from a 9/10 to about 4/10. He describes it as a sharp shooting pain that radiates down his left leg with certain movements. He also saw radiation oncology who arranged for palliative radiotherapy to the painful bone lesions in the left hip, L5, and skull lesions. This was completed on 11/13/20 under the care of Dr. Kathrynn Running.    For systemic treatment, the patient started on palliative systemic chemotherapy/immunotherapy with carboplatin, paclitaxel, and keytruda. He is status post his first cycle and tolerated it "great".  He denies fevers, chills, or night sweats. Weight loss and decreased appetite continues to be an  issue. He lost an additional 4 lbs since his last appointment. He is using carnation breakfast essentials. He does not like the  consistency of boost/ensure. His wife is trying to make him shakes. His breathing is stable. He denies chest pain. He reports he does not "cough much". He notes dyspnea on exertion but states it has improved. He denies nausea, vomiting, or diarrhea. He has some constipation and associated abdominal discomfort when he is constipated. He is taking stool softener. His last bowel movement was this morning.  He denies headaches or visual changes. He is inquiring about the COVID-19 booster vaccine.  He is here for evaluation and a 1 week follow up visit to manage any adverse side effects of treatment.    MEDICAL HISTORY: Past Medical History:  Diagnosis Date  . Anxiety   . COPD (chronic obstructive pulmonary disease) (HCC)   . GERD (gastroesophageal reflux disease)   . Headache   . Hypertension   . Pneumonia   . Situational depression   . Stage III squamous cell carcinoma of left lung (HCC) 06/19/2017   Lungs & Epiglottis  . Tubular adenoma of colon 01/2015  . Tubular adenoma of colon 01/2015    ALLERGIES:  is allergic to ativan [lorazepam] and escitalopram oxalate.  MEDICATIONS:  Current Outpatient Medications  Medication Sig Dispense Refill  . BREO ELLIPTA 100-25 MCG/INH AEPB INHALE 1 PUFF INTO THE LUNGS DAILY. ANNUAL APPT IS OVERDUE MUST SEE PROVIDER FOR FUTURE REFILLS 60 each 0  . clonazePAM (KLONOPIN) 0.5 MG tablet TAKE 1 OR 2 TABLETS BY MOUTH TWICE A DAY AS NEEDED FOR ANXIETY (Patient taking differently: Take 0.25 mg by mouth daily.) 180 tablet 0  . docusate sodium (COLACE) 100 MG capsule Take 200 mg by mouth daily.    Marland Kitchen esomeprazole (NEXIUM) 40 MG capsule TAKE 1 CAPSULE BY MOUTH EVERY DAY IN THE MORNING (Patient taking differently: Take 40 mg by mouth daily.) 90 capsule 3  . fentaNYL (DURAGESIC) 75 MCG/HR Place 1 patch onto the skin every 3 (three) days. 5 patch 0  . folic acid (FOLVITE) 1 MG tablet Take 1 mg by mouth daily.    . Multiple Vitamin (MULTIVITAMIN WITH MINERALS) TABS tablet  Take 1 tablet by mouth daily.    . Oxycodone HCl 10 MG TABS Take 1 tablet (10 mg total) by mouth every 4 (four) hours as needed. 40 tablet 0  . prochlorperazine (COMPAZINE) 10 MG tablet Take 1 tablet (10 mg total) by mouth every 6 (six) hours as needed for nausea or vomiting. 30 tablet 5   No current facility-administered medications for this visit.    SURGICAL HISTORY:  Past Surgical History:  Procedure Laterality Date  . COLONOSCOPY    . DIRECT LARYNGOSCOPY N/A 06/25/2017   Procedure: DIRECT LARYNGOSCOPY AND BIOPSY;  Surgeon: Drema Halon, MD;  Location: [facility];  Service: ENT;  Laterality: N/A;  . EAR CYST EXCISION N/A 11/17/2013   Procedure: SEBACEOUS CYST CHEST;  Surgeon: Clovis Pu. Cornett, MD;  Location: [facility];  Service: General;  Laterality: N/A;  . IR GASTROSTOMY TUBE MOD SED  08/10/2017  . IR GASTROSTOMY TUBE REMOVAL  09/29/2017  . KNEE ARTHROSCOPY     LEFT  . LIPOMA EXCISION N/A 11/17/2013   Procedure: EXCISION LIPOMA FOREHEAD;  Surgeon: Clovis Pu. Cornett, MD;  Location: [facility];  Service: General;  Laterality: N/A;  . LUNG BIOPSY Bilateral 06/12/2017   Procedure: LEFT LUNG BIOPSY;  Surgeon: Delight Ovens,  MD;  Location: [facility] OR;  Service: Thoracic;  Laterality: Bilateral;  . MICROLARYNGOSCOPY W/VOCAL CORD INJECTION Left 07/16/2018   Procedure: suspened micro laryngoscopy with jet ventilation and prolaryn injection;  Surgeon: Christia Reading, MD;  Location: [facility] OR;  Service: ENT;  Laterality: Left;  . PORTACATH PLACEMENT Right 07/01/2017   Procedure: INSERTION PORT-A-CATH - RIGHT IJ - placed with Fluoro and Ultrasound;  Surgeon: Delight Ovens, MD;  Location: [facility] OR;  Service: Thoracic;  Laterality: Right;  Marland Kitchen VIDEO BRONCHOSCOPY WITH ENDOBRONCHIAL ULTRASOUND N/A 06/12/2017   Procedure: VIDEO BRONCHOSCOPY WITH ENDOBRONCHIAL ULTRASOUND;  Surgeon: Delight Ovens, MD;  Location: [facility] OR;  Service: Thoracic;  Laterality: N/A;     REVIEW OF SYSTEMS:   Review of Systems  Constitutional: Positive for fatigue, appetite change, and weight loss. Negative for chills  Fever. HENT: Negative for mouth sores, nosebleeds, sore throat and trouble swallowing.   Eyes: Negative for eye problems and icterus.  Respiratory: Positive for mild cough and mild dyspnea on exertion. Negative for hemoptysis and wheezing.   Cardiovascular: Negative for chest pain and leg swelling.  Gastrointestinal: Negative for abdominal pain, constipation (improved), diarrhea, nausea and vomiting.  Genitourinary: Negative for bladder incontinence, difficulty urinating, dysuria, frequency and hematuria.   Musculoskeletal: Positive for left back pain and pain in left leg to knee. Negative for gait problem, neck pain and neck stiffness.  Skin: Negative for itching and rash.  Neurological: Negative for dizziness, extremity weakness, gait problem, headaches, light-headedness and seizures.  Hematological: Negative for adenopathy. Does not bruise/bleed easily.  Psychiatric/Behavioral: Negative for confusion, depression and sleep disturbance. The patient is not nervous/anxious.     PHYSICAL EXAMINATION:  Blood pressure (!) 118/96, pulse 91, temperature 98.8 F (37.1 C), temperature source Tympanic, resp. rate 16, height 5\' 10"  (1.778 m), weight 122 lb (55.3 kg), SpO2 97 %.  ECOG PERFORMANCE STATUS: 1 - Symptomatic but completely ambulatory  Physical Exam  Constitutional: Oriented to person, place, and time and thin appearing male and in no distress.  HENT:  Head: Normocephalic and atraumatic.  Mouth/Throat: Oropharynx is clear and moist. No oropharyngeal exudate.  Eyes: Conjunctivae are normal. Right eye exhibits no discharge. Left eye exhibits no discharge. No scleral icterus.  Neck: Normal range of motion. Neck supple.  Cardiovascular: Normal rate, regular rhythm, normal heart sounds and intact distal pulses.  Pulmonary/Chest: Effort normal and breath  sounds normal. No respiratory distress. No wheezes. No rales.  Abdominal: Soft. Bowel sounds are normal. Exhibits no distension and no mass. There is no tenderness.  Musculoskeletal: unable to assess ROM of lower extremity due to patient being in wheelchair. Exhibits no edema.  Lymphadenopathy:    No cervical adenopathy.  Neurological: Alert and oriented to person, place, and time. Exhibits muscle wasting. The patient was examined in the wheelchair.  Skin: Skin is warm and dry. No rash noted. Not diaphoretic. No erythema. No pallor.  Psychiatric: Mood, memory and judgment normal.  Vitals reviewed.  LABORATORY DATA: Lab Results  Component Value Date   WBC 19.3 (H) 11/22/2020   HGB 9.8 (L) 11/22/2020   HCT 30.0 (L) 11/22/2020   MCV 88.8 11/22/2020   PLT 283 11/22/2020      Chemistry      Component Value Date/Time   NA 135 11/22/2020 1410   NA 141 10/29/2017 1049   K 3.8 11/22/2020 1410   K 3.3 (L) 10/29/2017 1049   CL 96 (L) 11/22/2020 1410   CO2 30 11/22/2020 1410   CO2 26 10/29/2017  1049   BUN 11 11/22/2020 1410   BUN 11.2 10/29/2017 1049   CREATININE 0.60 (L) 11/22/2020 1410   CREATININE 0.7 10/29/2017 1049   GLU 198 08/25/2017 0000      Component Value Date/Time   CALCIUM 10.7 (H) 11/22/2020 1410   CALCIUM 9.0 10/29/2017 1049   ALKPHOS 116 11/22/2020 1410   ALKPHOS 82 10/29/2017 1049   AST 15 11/22/2020 1410   AST 10 10/29/2017 1049   ALT 32 11/22/2020 1410   ALT 8 10/29/2017 1049   BILITOT 0.4 11/22/2020 1410   BILITOT 0.22 10/29/2017 1049       RADIOGRAPHIC STUDIES:  MR BRAIN W WO CONTRAST  Result Date: 11/06/2020 CLINICAL DATA:  Non-small cell lung cancer, staging. EXAM: MRI HEAD WITHOUT AND WITH CONTRAST TECHNIQUE: Multiplanar, multiecho pulse sequences of the brain and surrounding structures were obtained without and with intravenous contrast. CONTRAST:  6mL GADAVIST GADOBUTROL 1 MMOL/ML IV SOLN COMPARISON:  10/19/2020 and prior. FINDINGS: Brain:  Multifocal nonenhancing restricted diffusion/T2 hyperintensity involving the left cerebellum measuring up to 8 mm. No intracranial hemorrhage. No midline shift, ventriculomegaly or extra-axial fluid collection. Centrally necrotic enhancing left frontal calvarial lesion measuring 4.3 x 3.2 cm with underlying dural thickening (15:49). Abnormal enhancing tissue measuring 2.8 x 2.8 cm (15:24) involving the left temporal fossa/temporalis muscle with underlying dural thickening and enhancement. Enhancing midline parietal calvarial lesion measuring 3.4 x 1.5 cm (15:33) with underlying dural thickening and enhancement. Asymmetric prominence of the lateral left parietal calvarium measuring 2.3 x 0.4 cm (15:33) may reflect an additional osseous metastases. Vascular: Normal flow voids. Skull and upper cervical spine: Please see above. Sinuses/Orbits: Normal orbits. Minimal left maxillary sinus mucosal thickening. No mastoid effusion. Other: None. IMPRESSION: Multifocal left cerebellar restricted diffusion concerning for acute/subacute infarcts versus evolving metastatic lesions. Multifocal calvarial lesions with underlying dural metastases involving the left frontal, midline parietal and left temporal regions. Prominence of the lateral left parietal calvarium may reflect a smaller osseous metastatic lesion. These results will be called to the ordering clinician or representative by the Radiologist Assistant, and communication documented in the PACS or [employer]. Electronically Signed   By: Stana Bunting M.D.   On: 11/06/2020 13:12   NM PET Image Restag (PS) Skull Base To Thigh  Result Date: 11/06/2020 CLINICAL DATA:  Subsequent treatment strategy for non-small cell lung cancer. Suspicion of recurrence. EXAM: NUCLEAR MEDICINE PET SKULL BASE TO THIGH TECHNIQUE: 7.4 mCi F-18 FDG was injected intravenously. Full-ring PET imaging was performed from the skull base to thigh after the radiotracer. CT data was  obtained and used for attenuation correction and anatomic localization. Fasting blood glucose: 87 mg/dl COMPARISON:  PET-CT 33/29/5188, CTs of the chest, abdomen and pelvis 10/19/2020 and abdominal MRI 10/20/2020. FINDINGS: Mediastinal blood pool activity: SUV max 1.7 NECK: There is a new hypermetabolic nodule posterior to the right angle of the mandible, measuring 1.8 cm on image 36/4 (SUV max 12.2). This is likely a lymph node. There is asymmetric hypermetabolic activity within the left oropharynx (SUV max 7.8) without clear corresponding mucosal abnormality on the CT images. There is asymmetrically increased activity within the right local cord which may relate to left vocal cord paralysis. Incidental CT findings: Bilateral carotid atherosclerosis. CHEST: There are multiple hypermetabolic mediastinal and hilar lymph nodes bilaterally. Subcarinal nodal mass has an SUV max of 14.7. Lymph nodes in the right hilum have an SUV max of 10.5, and in the left hilum 14.2. There are multiple hypermetabolic pulmonary nodules bilaterally. There  is a central infrahilar right lower lobe nodule with an SUV max of 10.9. There is an approximately 1.6 cm nodule anteriorly in the left upper lobe which is hypermetabolic (SUV max 8.8). Other small hypermetabolic central left lung nodules are present. There is new patchy right upper lobe airspace disease with associated low level hypermetabolic activity, likely inflammatory/infectious. There are small foci of hypermetabolic activity within the left pleural space, suspicious for pleural metastases. The most hypermetabolic nodule projects over the left hemidiaphragm and has an SUV max of 9.2. This is not well localized on the CT images and could be subdiaphragmatic. No suspicious for activity on the right. Incidental CT findings: Small bilateral pleural effusions. As above, new patchy airspace disease in the right upper lobe, likely inflammatory/infectious. Right IJ Port-A-Cath extends  to the superior cavoatrial junction. ABDOMEN/PELVIS: There is no hypermetabolic activity within the liver, adrenal glands, spleen or pancreas. However, there is intense hypermetabolic activity associated with the gallbladder (SUV max 25.0). As above, there is focal hypermetabolic activity near the left hemidiaphragm which does not localize well on the CT images, probably a pleural-based metastasis. There is a hypermetabolic soft tissue nodule medial to the left kidney, measuring 2.3 cm on image 135/4 (SUV max 15.5). There is irregular wall thickening of the cecum laterally with associated hypermetabolic activity. This has an SUV max of 19.5 and could reflect a peritoneal implant or primary colon cancer. Incidental CT findings: Stable small left adrenal nodule consistent with an adenoma. Aortic and branch vessel atherosclerosis. SKELETON: Widespread hypermetabolic osseous metastatic disease. There are hypermetabolic lesions within the left frontal parietal and occipital skull. There are bilateral scapular lesions, larger on the right (SUV max 22.0). There are bilateral rib lesions. There are multifocal lesions within the spine, most prominent in the L5 vertebral body (SUV max 9.6, with associated pathologic fracture), and the at the right T5 and T7 costovertebral junctions. There are large lytic hypermetabolic lesions within the pelvis bilaterally involving both iliac bones, including the left acetabulum and left pubic rami. Representative lytic lesions include a 3.0 cm posteriorly in the right iliac bone on image 175/4 and a 3.3 cm lesion in the left iliac bone on image 169/4. Incidental CT findings: none IMPRESSION: 1. Widespread hypermetabolic metastases as described. Sites of involvement include right retromalleolar, mediastinal and bilateral hilar lymph nodes; bilateral pulmonary nodules; the left pleural space; a retroperitoneal nodule medial to the left kidney; the gallbladder; and multiple osseous metastases  as detailed above. 2. In addition, there is focal hypermetabolic activity along the lateral aspect of the cecum associated with focal wall thickening which could reflect a peritoneal implant or primary cecal carcinoma. No evidence of bowel obstruction or perforation. 3. New patchy right upper lobe airspace disease with hypermetabolic activity, likely inflammatory/infectious. Electronically Signed   By: Carey Bullocks M.D.   On: 11/06/2020 16:56     ASSESSMENT/PLAN:  This is a very pleasant 55 year old Caucasian male initially diagnosed with stage IIIb/4 non-small cell lung cancer, squamous cell carcinoma.  He initially presented with a large left hilar mass in addition to mediastinal, left supraclavicular lymphadenopathy, and a suspicious right upper lobe pulmonary nodule.  He was initially diagnosed in August 2018.  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  The patient underwent concurrent chemoradiation to the lung as well as the epiglottic area under the care of Dr. Kathrynn Running status post 6 cycles.  He tolerated this treatment well except for radiation-induced esophagitis, weight loss, and  fatigue.  The patient then completed 35 cycles of immunotherapy with Keytruda 200 mg IV every 3 weeks.  He has been on observation for several months until he developed metastatic disease to the left fifth rib which was treated with radiotherapy in the fall 2021.  In December 2021, the patient had evidence of widespread metastatic disease including the right retromalleolar, mediastinal, bilateral hilar lymph nodes, bilateral pulmonary nodules, left pleural space, retroperitoneal nodule, metastatic disease to the gallbladder, and multiple osseous metastases.  There is also hypermetabolic activity along the lateral aspect of the cecum associated with focal wall thickening which could reflect retroperitoneal implant or primary cecal cell carcinoma.  But there is no  evidence of bowel obstruction or perforation.  The patient had a repeat MRI of the brain which showed a centrally necrotic enhancing left frontal calvarial lesion measuring 4.3 x 3.2 cm with underlying dural thickness.  There was also abnormal enhancing tissue measuring 2.8 x 2 cm involving the left temporal fossa/temporalis muscle with underlying dural thickening and enhancement as part of the multifocal Irregular mass with underlying dural metastasis involving left frontal, midline parietal, and left temporal regions.  There is also multifocal left cerebellar restricted diffusion concerning for acute/subacute infarcts versus evolving metastatic lesions.  The patient then started systemic palliative chemotherapy/immunotherapy with carboplatin for AUC 5, paclitaxel 175 mg per metered squared, Keytruda 200 mg IV every 3 weeks with Neulasta support.  He status post 1 cycle and tolerated it well.   The patient was seen with Dr. Arbutus Ped today.  Labs were reviewed.  Dr. Arbutus Ped recommends that he continue on the same treatment at the same dose.   We will see the patient back for follow-up visit in 2 weeks for evaluation before starting cycle #2.  Discussed his pain management with Dr. Arbutus Ped who would like to keep him on the same pain regimen for now. Discussed that if needed, he may take NSAIDs occasionally as well. He is presently on 75 mcg of fentanyl and oxycodone 10 mg every 6 hours for breakthrough pain. The patient already completed palliative radiation to the left hip/leg. Dr. Arbutus Ped recommends referral to orthopedic surgery to see if the patient is at risk for pathologic fracture at the hip and if any surgical intervention is recommended. The patient was previously seen at [facility]. I will place a referral.   For the hypercalcemia secondary to the bone metastases, the patient received Zometa and IV fluids on 11/07/20.  Starting from his next cycle of treatment on 12/06/20, the patient will  receive xgeva 120 mg every 4 weeks.   Regarding the COVID-19 booster, discussed that we can arrange for this on an off week of chemo. He would like to think about this before scheduling.   Regarding his weight loss and poor appetite, the patient was encouraged to continue to increase his calorie intake. If the boost/ensure is too sweet/thick for him, discussed he can [place] milk, soy milk, ice, or almond milk to thin the consistency/sweetness. He was encouraged to make his own protein shakes as well. Dr. Arbutus Ped does not want to start him on megace due to risk for clots. He does not want to start him on steroids due to his treatment with immunotherapy. He does not recommend Marinol due to his pain medication use.   Constipation education was discussed with the patient.   The patient was advised to call immediately if he has any concerning symptoms in the interval. The patient voices understanding of current disease  status and treatment options and is in agreement with the current care plan. All questions were answered. The patient knows to call the clinic with any problems, questions or concerns. We can certainly see the patient much sooner if necessary     Orders Placed This Encounter  Procedures  . Ambulatory referral to Orthopedic Surgery    Referral Priority:   Routine    Referral Type:   Surgical    Referral Reason:   Specialty Services Required    Requested Specialty:   Orthopedic Surgery    Number of Visits Requested:   1     I spent 20-29 minutes with the patient.  Lashawna Poche L Sita Mangen, PA-C 11/22/20   ADDENDUM: Hematology/Oncology Attending: I had a face-to-face encounter with the patient today.  I recommended his care plan.  This is a very pleasant 55 years old white male with metastatic non-small cell lung cancer that was initially diagnosed as a stage IV squamous cell carcinoma in August 2018.  He was treated with a course of concurrent chemoradiation followed by  consolidation treatment with immunotherapy and radiation to the neck. Unfortunately the patient had evidence for disease recurrence recently with extensive metastatic disease to multiple bone areas including the spine, right shoulder, the hips bilaterally.  He continues to have significant pain and the left hip area and he was treated with palliative radiotherapy to this area as well as the spine. The patient is started systemic chemotherapy again with carboplatin for AUC of 5, paclitaxel 175 Mg/M2 and Keytruda 200 mg IV every 3 weeks status post 1 cycle started last week. The patient tolerated the first cycle of his treatment well with no concerning adverse effects. He continues to have significant pain and the metastatic bone lesion and he is currently on fentanyl patch 75 mcg/hour every 3 days in addition to oxycodone 10 mg p.o. every 6 hours as needed.  His pain ranges around 6 on a scale from 1-10. I recommended for the patient to continue his current treatment with systemic chemotherapy and he expected to start cycle #2 in 2 weeks. For the persistent left hip pain, we will refer the patient to orthopedic surgery for evaluation and intervention for any impending fracture. For the hypercalcemia of malignancy, he received treatment with Zometa in the past and he will start Xgeva every 4 weeks with the next cycle of his chemotherapy. He was advised to call immediately if he has any other concerning symptoms in the interval. The time spent by me on this visit reviewing the records and changing his care plan was 32 minutes. Disclaimer: This note was dictated with voice recognition software. Similar sounding words can inadvertently be transcribed and may be missed upon review. Lajuana Matte, MD 11/22/20

## 2020-11-19 NOTE — Patient Instructions (Signed)
Pegfilgrastim injection What is this medicine? PEGFILGRASTIM (PEG fil gra stim) is a long-acting granulocyte colony-stimulating factor that stimulates the growth of neutrophils, a type of white blood cell important in the body's fight against infection. It is used to reduce the incidence of fever and infection in patients with certain types of cancer who are receiving chemotherapy that affects the bone marrow, and to increase survival after being exposed to high doses of radiation. This medicine may be used for other purposes; ask your health care provider or pharmacist if you have questions. COMMON BRAND NAME(S): Fulphila, Neulasta, Nyvepria, UDENYCA, Ziextenzo What should I tell my health care provider before I take this medicine? They need to know if you have any of these conditions:  kidney disease  latex allergy  ongoing radiation therapy  sickle cell disease  skin reactions to acrylic adhesives (On-Body Injector only)  an unusual or allergic reaction to pegfilgrastim, filgrastim, other medicines, foods, dyes, or preservatives  pregnant or trying to get pregnant  breast-feeding How should I use this medicine? This medicine is for injection under the skin. If you get this medicine at home, you will be taught how to prepare and give the pre-filled syringe or how to use the On-body Injector. Refer to the patient Instructions for Use for detailed instructions. Use exactly as directed. Tell your healthcare provider immediately if you suspect that the On-body Injector may not have performed as intended or if you suspect the use of the On-body Injector resulted in a missed or partial dose. It is important that you put your used needles and syringes in a special sharps container. Do not put them in a trash can. If you do not have a sharps container, call your pharmacist or healthcare provider to get one. Talk to your pediatrician regarding the use of this medicine in children. While this drug  may be prescribed for selected conditions, precautions do apply. Overdosage: If you think you have taken too much of this medicine contact a poison control center or emergency room at once. NOTE: This medicine is only for you. Do not share this medicine with others. What if I miss a dose? It is important not to miss your dose. Call your doctor or health care professional if you miss your dose. If you miss a dose due to an On-body Injector failure or leakage, a new dose should be administered as soon as possible using a single prefilled syringe for manual use. What may interact with this medicine? Interactions have not been studied. This list may not describe all possible interactions. Give your health care provider a list of all the medicines, herbs, non-prescription drugs, or dietary supplements you use. Also tell them if you smoke, drink alcohol, or use illegal drugs. Some items may interact with your medicine. What should I watch for while using this medicine? Your condition will be monitored carefully while you are receiving this medicine. You may need blood work done while you are taking this medicine. Talk to your health care provider about your risk of cancer. You may be more at risk for certain types of cancer if you take this medicine. If you are going to need a MRI, CT scan, or other procedure, tell your doctor that you are using this medicine (On-Body Injector only). What side effects may I notice from receiving this medicine? Side effects that you should report to your doctor or health care professional as soon as possible:  allergic reactions (skin rash, itching or hives, swelling of   the face, lips, or tongue)  back pain  dizziness  fever  pain, redness, or irritation at site where injected  pinpoint red spots on the skin  red or dark-brown urine  shortness of breath or breathing problems  stomach or side pain, or pain at the shoulder  swelling  tiredness  trouble  passing urine or change in the amount of urine  unusual bruising or bleeding Side effects that usually do not require medical attention (report to your doctor or health care professional if they continue or are bothersome):  bone pain  muscle pain This list may not describe all possible side effects. Call your doctor for medical advice about side effects. You may report side effects to FDA at 1-800-FDA-1088. Where should I keep my medicine? Keep out of the reach of children. If you are using this medicine at home, you will be instructed on how to store it. Throw away any unused medicine after the expiration date on the label. NOTE: This sheet is a summary. It may not cover all possible information. If you have questions about this medicine, talk to your doctor, pharmacist, or health care provider.  2021 Elsevier/Gold Standard (2019-11-18 13:20:51)  

## 2020-11-19 NOTE — Progress Notes (Signed)
Per Dr Julien Nordmann ,it is okay to give Neulasta biosimilir and BP of 72/59. IVF ordered for today .

## 2020-11-20 ENCOUNTER — Telehealth: Payer: Self-pay | Admitting: Medical Oncology

## 2020-11-20 ENCOUNTER — Other Ambulatory Visit: Payer: Self-pay | Admitting: Internal Medicine

## 2020-11-20 MED ORDER — OXYCODONE HCL 10 MG PO TABS: 10.0000 mg | ORAL_TABLET | ORAL | 0 refills | Status: DC | PRN

## 2020-11-20 NOTE — Telephone Encounter (Signed)
Requests refill for Oxycodone. He started the Fentanyl 75 mcg today.

## 2020-11-22 ENCOUNTER — Inpatient Hospital Stay: Payer: 59

## 2020-11-22 ENCOUNTER — Other Ambulatory Visit: Payer: Self-pay

## 2020-11-22 ENCOUNTER — Encounter: Payer: Self-pay | Admitting: Physician Assistant

## 2020-11-22 ENCOUNTER — Inpatient Hospital Stay (HOSPITAL_BASED_OUTPATIENT_CLINIC_OR_DEPARTMENT_OTHER): Payer: 59 | Admitting: Physician Assistant

## 2020-11-22 VITALS — BP 118/96 | HR 91 | Temp 98.8°F | Resp 16 | Ht 70.0 in | Wt 122.0 lb

## 2020-11-22 DIAGNOSIS — C321 Malignant neoplasm of supraglottis: Secondary | ICD-10-CM | POA: Diagnosis not present

## 2020-11-22 DIAGNOSIS — C7951 Secondary malignant neoplasm of bone: Secondary | ICD-10-CM

## 2020-11-22 DIAGNOSIS — Z5111 Encounter for antineoplastic chemotherapy: Secondary | ICD-10-CM | POA: Diagnosis not present

## 2020-11-22 LAB — CBC WITH DIFFERENTIAL (CANCER CENTER ONLY)
Abs Immature Granulocytes: 0.59 10*3/uL — ABNORMAL HIGH (ref 0.00–0.07)
Basophils Absolute: 0 10*3/uL (ref 0.0–0.1)
Basophils Relative: 0 %
Eosinophils Absolute: 0.1 10*3/uL (ref 0.0–0.5)
Eosinophils Relative: 1 %
HCT: 30 % — ABNORMAL LOW (ref 39.0–52.0)
Hemoglobin: 9.8 g/dL — ABNORMAL LOW (ref 13.0–17.0)
Immature Granulocytes: 3 %
Lymphocytes Relative: 1 %
Lymphs Abs: 0.2 10*3/uL — ABNORMAL LOW (ref 0.7–4.0)
MCH: 29 pg (ref 26.0–34.0)
MCHC: 32.7 g/dL (ref 30.0–36.0)
MCV: 88.8 fL (ref 80.0–100.0)
Monocytes Absolute: 0.6 10*3/uL (ref 0.1–1.0)
Monocytes Relative: 3 %
Neutro Abs: 17.8 10*3/uL — ABNORMAL HIGH (ref 1.7–7.7)
Neutrophils Relative %: 92 %
Platelet Count: 283 10*3/uL (ref 150–400)
RBC: 3.38 MIL/uL — ABNORMAL LOW (ref 4.22–5.81)
RDW: 13.7 % (ref 11.5–15.5)
WBC Count: 19.3 10*3/uL — ABNORMAL HIGH (ref 4.0–10.5)
nRBC: 0 % (ref 0.0–0.2)

## 2020-11-22 LAB — CMP (CANCER CENTER ONLY)
ALT: 32 U/L (ref 0–44)
AST: 15 U/L (ref 15–41)
Albumin: 2.7 g/dL — ABNORMAL LOW (ref 3.5–5.0)
Alkaline Phosphatase: 116 U/L (ref 38–126)
Anion gap: 9 (ref 5–15)
BUN: 11 mg/dL (ref 6–20)
CO2: 30 mmol/L (ref 22–32)
Calcium: 10.7 mg/dL — ABNORMAL HIGH (ref 8.9–10.3)
Chloride: 96 mmol/L — ABNORMAL LOW (ref 98–111)
Creatinine: 0.6 mg/dL — ABNORMAL LOW (ref 0.61–1.24)
GFR, Estimated: >60 mL/min (ref >60)
Glucose, Bld: 116 mg/dL — ABNORMAL HIGH (ref 70–99)
Potassium: 3.8 mmol/L (ref 3.5–5.1)
Sodium: 135 mmol/L (ref 135–145)
Total Bilirubin: 0.4 mg/dL (ref 0.3–1.2)
Total Protein: 6.1 g/dL — ABNORMAL LOW (ref 6.5–8.1)

## 2020-11-23 ENCOUNTER — Telehealth: Payer: Self-pay | Admitting: Internal Medicine

## 2020-11-23 NOTE — Telephone Encounter (Signed)
Release: [id number] Per referral request Records and demographics were sent to [facility] @ fax# [phone]

## 2020-11-26 ENCOUNTER — Telehealth: Payer: Self-pay | Admitting: Physician Assistant

## 2020-11-26 NOTE — Telephone Encounter (Signed)
Scheduled appts per 1/13 los. Left voicemail with next appt date and time.

## 2020-11-27 ENCOUNTER — Telehealth: Payer: Self-pay | Admitting: Medical Oncology

## 2020-11-27 NOTE — Telephone Encounter (Signed)
Email received with  FMLA papers for intermittent leave for [employer]. Completed signed  form and placed in [place]

## 2020-11-29 ENCOUNTER — Other Ambulatory Visit: Payer: Self-pay

## 2020-11-29 ENCOUNTER — Other Ambulatory Visit: Payer: Self-pay | Admitting: Medical Oncology

## 2020-11-29 ENCOUNTER — Other Ambulatory Visit: Payer: Self-pay | Admitting: Orthopedic Surgery

## 2020-11-29 ENCOUNTER — Inpatient Hospital Stay: Payer: 59

## 2020-11-29 DIAGNOSIS — M25552 Pain in left hip: Secondary | ICD-10-CM

## 2020-11-29 DIAGNOSIS — Z5111 Encounter for antineoplastic chemotherapy: Secondary | ICD-10-CM | POA: Diagnosis not present

## 2020-11-29 DIAGNOSIS — C321 Malignant neoplasm of supraglottis: Secondary | ICD-10-CM

## 2020-11-29 DIAGNOSIS — M898X5 Other specified disorders of bone, thigh: Secondary | ICD-10-CM

## 2020-11-29 LAB — CBC WITH DIFFERENTIAL (CANCER CENTER ONLY)
Abs Immature Granulocytes: 0.23 10*3/uL — ABNORMAL HIGH (ref 0.00–0.07)
Basophils Absolute: 0.1 10*3/uL (ref 0.0–0.1)
Basophils Relative: 0 %
Eosinophils Absolute: 0 10*3/uL (ref 0.0–0.5)
Eosinophils Relative: 0 %
HCT: 32.1 % — ABNORMAL LOW (ref 39.0–52.0)
Hemoglobin: 10.3 g/dL — ABNORMAL LOW (ref 13.0–17.0)
Immature Granulocytes: 1 %
Lymphocytes Relative: 1 %
Lymphs Abs: 0.3 10*3/uL — ABNORMAL LOW (ref 0.7–4.0)
MCH: 28.1 pg (ref 26.0–34.0)
MCHC: 32.1 g/dL (ref 30.0–36.0)
MCV: 87.7 fL (ref 80.0–100.0)
Monocytes Absolute: 0.9 10*3/uL (ref 0.1–1.0)
Monocytes Relative: 5 %
Neutro Abs: 18.5 10*3/uL — ABNORMAL HIGH (ref 1.7–7.7)
Neutrophils Relative %: 93 %
Platelet Count: 237 10*3/uL (ref 150–400)
RBC: 3.66 MIL/uL — ABNORMAL LOW (ref 4.22–5.81)
RDW: 14 % (ref 11.5–15.5)
WBC Count: 19.9 10*3/uL — ABNORMAL HIGH (ref 4.0–10.5)
nRBC: 0 % (ref 0.0–0.2)

## 2020-11-29 LAB — CMP (CANCER CENTER ONLY)
ALT: 28 U/L (ref 0–44)
AST: 19 U/L (ref 15–41)
Albumin: 2.6 g/dL — ABNORMAL LOW (ref 3.5–5.0)
Alkaline Phosphatase: 157 U/L — ABNORMAL HIGH (ref 38–126)
Anion gap: 9 (ref 5–15)
BUN: 13 mg/dL (ref 6–20)
CO2: 30 mmol/L (ref 22–32)
Calcium: 11.6 mg/dL — ABNORMAL HIGH (ref 8.9–10.3)
Chloride: 97 mmol/L — ABNORMAL LOW (ref 98–111)
Creatinine: 0.65 mg/dL (ref 0.61–1.24)
GFR, Estimated: >60 mL/min (ref >60)
Glucose, Bld: 108 mg/dL — ABNORMAL HIGH (ref 70–99)
Potassium: 3.7 mmol/L (ref 3.5–5.1)
Sodium: 136 mmol/L (ref 135–145)
Total Bilirubin: 0.5 mg/dL (ref 0.3–1.2)
Total Protein: 6.1 g/dL — ABNORMAL LOW (ref 6.5–8.1)

## 2020-11-29 NOTE — Telephone Encounter (Signed)
Request refill both pain meds - he will need this weekend.  BP -77/48 at Orthoped[facility] today.  Feels weak and tired & mildly lightheaded when he gets up out of chair.  Weekly labs done today.  Schedule message sent for IVF tomorrow.

## 2020-11-30 ENCOUNTER — Inpatient Hospital Stay: Payer: 59

## 2020-11-30 ENCOUNTER — Other Ambulatory Visit: Payer: Self-pay

## 2020-11-30 ENCOUNTER — Other Ambulatory Visit: Payer: Self-pay | Admitting: Physician Assistant

## 2020-11-30 VITALS — BP 92/57 | HR 100 | Temp 98.3°F | Resp 20

## 2020-11-30 DIAGNOSIS — C321 Malignant neoplasm of supraglottis: Secondary | ICD-10-CM

## 2020-11-30 DIAGNOSIS — Z5111 Encounter for antineoplastic chemotherapy: Secondary | ICD-10-CM | POA: Diagnosis not present

## 2020-11-30 DIAGNOSIS — G893 Neoplasm related pain (acute) (chronic): Secondary | ICD-10-CM

## 2020-11-30 DIAGNOSIS — Z95828 Presence of other vascular implants and grafts: Secondary | ICD-10-CM

## 2020-11-30 MED ORDER — SODIUM CHLORIDE 0.9% FLUSH
10.0000 mL | Freq: Once | INTRAVENOUS | Status: AC
  Administered 2020-11-30: 10 mL
  Filled 2020-11-30: qty 10

## 2020-11-30 MED ORDER — OXYCODONE HCL 10 MG PO TABS: 10.0000 mg | ORAL_TABLET | ORAL | 0 refills | Status: DC | PRN

## 2020-11-30 MED ORDER — HEPARIN SOD (PORK) LOCK FLUSH 100 UNIT/ML IV SOLN
500.0000 [IU] | Freq: Once | INTRAVENOUS | Status: AC
  Administered 2020-11-30: 500 [IU]
  Filled 2020-11-30: qty 5

## 2020-11-30 MED ORDER — SODIUM CHLORIDE 0.9 % IV SOLN
Freq: Once | INTRAVENOUS | Status: AC
  Filled 2020-11-30: qty 250

## 2020-11-30 MED ORDER — SODIUM CHLORIDE 0.9 % IV SOLN
INTRAVENOUS | Status: DC
  Filled 2020-11-30: qty 250

## 2020-11-30 MED FILL — oxyCODONE HCL 10 MG TABS: 10 | 6 days supply | Qty: 40 | Fill #0

## 2020-11-30 NOTE — Patient Instructions (Signed)
Rehydration, Adult Rehydration is the replacement of body fluids, salts, and minerals (electrolytes) that are lost during dehydration. Dehydration is when there is not enough water or other fluids in the body. This happens when you lose more fluids than you take in. Common causes of dehydration include:  Not drinking enough fluids. This can occur when you are ill or doing activities that require a lot of energy, especially in hot weather.  Conditions that cause loss of water or other fluids, such as diarrhea, vomiting, sweating, or urinating a lot.  Other illnesses, such as fever or infection.  Certain medicines, such as those that remove excess fluid from the body (diuretics). Symptoms of mild or moderate dehydration may include thirst, dry lips and mouth, and dizziness. Symptoms of severe dehydration may include increased heart rate, confusion, fainting, and not urinating. For severe dehydration, you may need to get fluids through an IV at the hospital. For mild or moderate dehydration, you can usually rehydrate at home by drinking certain fluids as told by your health care provider. What are the risks? Generally, rehydration is safe. However, taking in too much fluid (overhydration) can be a problem. This is rare. Overhydration can cause an electrolyte imbalance, kidney failure, or a decrease in salt (sodium) levels in the body. Supplies needed You will need an oral rehydration solution (ORS) if your health care provider tells you to use one. This is a drink to treat dehydration. It can be found in pharmacies and retail stores. How to rehydrate Fluids Follow instructions from your health care provider for rehydration. The kind of fluid and the amount you should drink depend on your condition. In general, you should choose drinks that you prefer.  If told by your health care provider, drink an ORS. ? Make an ORS by following instructions on the package. ? Start by drinking small amounts,  about  cup (120 mL) every 5-10 minutes. ? Slowly increase how much you drink until you have taken the amount recommended by your health care provider.  Drink enough clear fluids to keep your urine pale yellow. If you were told to drink an ORS, finish it first, then start slowly drinking other clear fluids. Drink fluids such as: ? Water. This includes sparkling water and flavored water. Drinking only water can lead to having too little sodium in your body (hyponatremia). Follow the advice of your health care provider. ? Water from ice chips you suck on. ? Fruit juice with water you add to it (diluted). ? Sports drinks. ? Hot or cold herbal teas. ? Broth-based soups. ? Milk or milk products. Food Follow instructions from your health care provider about what to eat while you rehydrate. Your health care provider may recommend that you slowly begin eating regular foods in small amounts.  Eat foods that contain a healthy balance of electrolytes, such as bananas, oranges, potatoes, tomatoes, and spinach.  Avoid foods that are greasy or contain a lot of sugar. In some cases, you may get nutrition through a feeding tube that is passed through your nose and into your stomach (nasogastric tube, or NG tube). This may be done if you have uncontrolled vomiting or diarrhea.   Beverages to avoid Certain beverages may make dehydration worse. While you rehydrate, avoid drinking alcohol.   How to tell if you are recovering from dehydration You may be recovering from dehydration if:  You are urinating more often than before you started rehydrating.  Your urine is pale yellow.  Your energy level   improves.  You vomit less frequently.  You have diarrhea less frequently.  Your appetite improves or returns to normal.  You feel less dizzy or less light-headed.  Your skin tone and color start to look more normal. Follow these instructions at home:  Take over-the-counter and prescription medicines only  as told by your health care provider.  Do not take sodium tablets. Doing this can lead to having too much sodium in your body (hypernatremia). Contact a health care provider if:  You continue to have symptoms of mild or moderate dehydration, such as: ? Thirst. ? Dry lips. ? Slightly dry mouth. ? Dizziness. ? Dark urine or less urine than normal. ? Muscle cramps.  You continue to vomit or have diarrhea. Get help right away if you:  Have symptoms of dehydration that get worse.  Have a fever.  Have a severe headache.  Have been vomiting and the following happens: ? Your vomiting gets worse or does not go away. ? Your vomit includes blood or green matter (bile). ? You cannot eat or drink without vomiting.  Have problems with urination or bowel movements, such as: ? Diarrhea that gets worse or does not go away. ? Blood in your stool (feces). This may cause stool to look black and tarry. ? Not urinating, or urinating only a small amount of very dark urine, within 6-8 hours.  Have trouble breathing.  Have symptoms that get worse with treatment. These symptoms may represent a serious problem that is an emergency. Do not wait to see if the symptoms will go away. Get medical help right away. Call your local emergency services (911 in the [place] Do not drive yourself to the hospital. Summary  Rehydration is the replacement of body fluids and minerals (electrolytes) that are lost during dehydration.  Follow instructions from your health care provider for rehydration. The kind of fluid and amount you should drink depend on your condition.  Slowly increase how much you drink until you have taken the amount recommended by your health care provider.  Contact your health care provider if you continue to show signs of mild or moderate dehydration. This information is not intended to replace advice given to you by your health care provider. Make sure you discuss any questions you have with  your health care provider. Document Revised: 12/28/2019 Document Reviewed: 11/07/2019 Elsevier Patient Education  2021 Elsevier Inc.  

## 2020-12-03 ENCOUNTER — Inpatient Hospital Stay: Payer: 59

## 2020-12-03 NOTE — Progress Notes (Signed)
  Radiation Oncology         ([phone]) [phone] ________________________________  Name: Trevor Zavala MRN: [id number]  Date: 11/13/2020  DOB: 07/31/1966  End of Treatment Note  Diagnosis:   55 y.o. 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     Indication for treatment:  Palliation of pain       Radiation treatment dates:   10/29/20-11/13/20  Site/dose:    1.  Multiple calvarial metastases were treated conformally to 30 Gy in 10 fractions of 3 Gy 2.  Metastases in L5 and the left hip were treated to 30 Gy in 10 fractions of 3 Gy  Beams/energy:    1.  Multiple calvarial metastases were treated conformally from 4 static gantry positions/fields with custom MLC with 6 MV X-rays 2.  Metastases in L5 and the left hip were treated from 4 static gantry positions/fields with custom MLC with 6 MV X-rays  Narrative: The patient tolerated radiation treatment relatively well.   Pain improved with minimal toxicity.  Plan: The patient has completed radiation treatment. The patient will return to radiation oncology clinic for routine followup in one month. I advised him to call or return sooner if he has any questions or concerns related to his recovery or treatment. ________________________________  Sheral Apley. Tammi Klippel, M.D.

## 2020-12-05 ENCOUNTER — Other Ambulatory Visit: Payer: Self-pay | Admitting: Radiation Therapy

## 2020-12-05 ENCOUNTER — Telehealth: Payer: Self-pay | Admitting: Radiation Therapy

## 2020-12-05 DIAGNOSIS — C7931 Secondary malignant neoplasm of brain: Secondary | ICD-10-CM

## 2020-12-05 NOTE — Telephone Encounter (Signed)
Did not reach the patient on the phone, so I called his wife, Jenny Reichmann to discuss Mr. Campillo upcoming imaging and follow-up with Dr. Mickeal Skinner. She is aware of the appointment information and has our contact information if there are any questions or concerns.   Mont Dutton R.T.(R)(T) Radiation Special Procedures Navigator

## 2020-12-06 ENCOUNTER — Inpatient Hospital Stay: Payer: 59 | Admitting: Nutrition

## 2020-12-06 ENCOUNTER — Inpatient Hospital Stay (HOSPITAL_BASED_OUTPATIENT_CLINIC_OR_DEPARTMENT_OTHER): Payer: 59 | Admitting: Internal Medicine

## 2020-12-06 ENCOUNTER — Encounter: Payer: Self-pay | Admitting: Internal Medicine

## 2020-12-06 ENCOUNTER — Inpatient Hospital Stay: Payer: 59

## 2020-12-06 ENCOUNTER — Other Ambulatory Visit: Payer: Self-pay

## 2020-12-06 ENCOUNTER — Other Ambulatory Visit: Payer: Self-pay | Admitting: Medical Oncology

## 2020-12-06 VITALS — BP 78/54 | HR 86 | Temp 98.1°F | Resp 16 | Ht 70.0 in | Wt 121.0 lb

## 2020-12-06 DIAGNOSIS — C321 Malignant neoplasm of supraglottis: Secondary | ICD-10-CM

## 2020-12-06 DIAGNOSIS — Z5111 Encounter for antineoplastic chemotherapy: Secondary | ICD-10-CM | POA: Diagnosis not present

## 2020-12-06 DIAGNOSIS — I10 Essential (primary) hypertension: Secondary | ICD-10-CM

## 2020-12-06 DIAGNOSIS — R5382 Chronic fatigue, unspecified: Secondary | ICD-10-CM | POA: Diagnosis not present

## 2020-12-06 DIAGNOSIS — C7951 Secondary malignant neoplasm of bone: Secondary | ICD-10-CM | POA: Diagnosis not present

## 2020-12-06 DIAGNOSIS — C3412 Malignant neoplasm of upper lobe, left bronchus or lung: Secondary | ICD-10-CM

## 2020-12-06 DIAGNOSIS — Z95828 Presence of other vascular implants and grafts: Secondary | ICD-10-CM

## 2020-12-06 DIAGNOSIS — Z5112 Encounter for antineoplastic immunotherapy: Secondary | ICD-10-CM

## 2020-12-06 LAB — CMP (CANCER CENTER ONLY)
ALT: 16 U/L (ref 0–44)
AST: 17 U/L (ref 15–41)
Albumin: 2.7 g/dL — ABNORMAL LOW (ref 3.5–5.0)
Alkaline Phosphatase: 100 U/L (ref 38–126)
Anion gap: 9 (ref 5–15)
BUN: 11 mg/dL (ref 6–20)
CO2: 31 mmol/L (ref 22–32)
Calcium: 11.9 mg/dL — ABNORMAL HIGH (ref 8.9–10.3)
Chloride: 99 mmol/L (ref 98–111)
Creatinine: 0.62 mg/dL (ref 0.61–1.24)
GFR, Estimated: >60 mL/min (ref >60)
Glucose, Bld: 111 mg/dL — ABNORMAL HIGH (ref 70–99)
Potassium: 3.6 mmol/L (ref 3.5–5.1)
Sodium: 139 mmol/L (ref 135–145)
Total Bilirubin: 0.3 mg/dL (ref 0.3–1.2)
Total Protein: 6.1 g/dL — ABNORMAL LOW (ref 6.5–8.1)

## 2020-12-06 LAB — CBC WITH DIFFERENTIAL (CANCER CENTER ONLY)
Abs Immature Granulocytes: 0.06 10*3/uL (ref 0.00–0.07)
Basophils Absolute: 0 10*3/uL (ref 0.0–0.1)
Basophils Relative: 0 %
Eosinophils Absolute: 0 10*3/uL (ref 0.0–0.5)
Eosinophils Relative: 0 %
HCT: 29.2 % — ABNORMAL LOW (ref 39.0–52.0)
Hemoglobin: 9.2 g/dL — ABNORMAL LOW (ref 13.0–17.0)
Immature Granulocytes: 1 %
Lymphocytes Relative: 3 %
Lymphs Abs: 0.2 10*3/uL — ABNORMAL LOW (ref 0.7–4.0)
MCH: 27.8 pg (ref 26.0–34.0)
MCHC: 31.5 g/dL (ref 30.0–36.0)
MCV: 88.2 fL (ref 80.0–100.0)
Monocytes Absolute: 0.6 10*3/uL (ref 0.1–1.0)
Monocytes Relative: 7 %
Neutro Abs: 8 10*3/uL — ABNORMAL HIGH (ref 1.7–7.7)
Neutrophils Relative %: 89 %
Platelet Count: 329 10*3/uL (ref 150–400)
RBC: 3.31 MIL/uL — ABNORMAL LOW (ref 4.22–5.81)
RDW: 14.6 % (ref 11.5–15.5)
WBC Count: 9 10*3/uL (ref 4.0–10.5)
nRBC: 0 % (ref 0.0–0.2)

## 2020-12-06 MED ORDER — HEPARIN SOD (PORK) LOCK FLUSH 100 UNIT/ML IV SOLN
500.0000 [IU] | Freq: Once | INTRAVENOUS | Status: AC | PRN
  Administered 2020-12-06: 500 [IU]
  Filled 2020-12-06: qty 5

## 2020-12-06 MED ORDER — PALONOSETRON HCL INJECTION 0.25 MG/5ML
0.2500 mg | Freq: Once | INTRAVENOUS | Status: AC
  Administered 2020-12-06: 0.25 mg via INTRAVENOUS

## 2020-12-06 MED ORDER — DIPHENHYDRAMINE HCL 50 MG/ML IJ SOLN
50.0000 mg | Freq: Once | INTRAMUSCULAR | Status: AC
  Administered 2020-12-06: 50 mg via INTRAVENOUS

## 2020-12-06 MED ORDER — CARBOPLATIN CHEMO INJECTION 600 MG/60ML
543.5000 mg | Freq: Once | INTRAVENOUS | Status: AC
  Administered 2020-12-06: 540 mg via INTRAVENOUS
  Filled 2020-12-06: qty 54

## 2020-12-06 MED ORDER — SODIUM CHLORIDE 0.9 % IV SOLN
Freq: Once | INTRAVENOUS | Status: AC
  Filled 2020-12-06: qty 250

## 2020-12-06 MED ORDER — DENOSUMAB 120 MG/1.7ML ~~LOC~~ SOLN
SUBCUTANEOUS | Status: AC
  Filled 2020-12-06: qty 1.7

## 2020-12-06 MED ORDER — SODIUM CHLORIDE 0.9 % IV SOLN
150.0000 mg | Freq: Once | INTRAVENOUS | Status: AC
  Administered 2020-12-06: 150 mg via INTRAVENOUS
  Filled 2020-12-06: qty 150

## 2020-12-06 MED ORDER — SODIUM CHLORIDE 0.9% FLUSH
10.0000 mL | INTRAVENOUS | Status: DC | PRN
  Administered 2020-12-06: 10 mL
  Filled 2020-12-06: qty 10

## 2020-12-06 MED ORDER — SODIUM CHLORIDE 0.9 % IV SOLN
10.0000 mg | Freq: Once | INTRAVENOUS | Status: AC
  Administered 2020-12-06: 10 mg via INTRAVENOUS
  Filled 2020-12-06: qty 10

## 2020-12-06 MED ORDER — FAMOTIDINE IN NACL 20-0.9 MG/50ML-% IV SOLN
20.0000 mg | Freq: Once | INTRAVENOUS | Status: AC
  Administered 2020-12-06: 20 mg via INTRAVENOUS

## 2020-12-06 MED ORDER — DIPHENHYDRAMINE HCL 50 MG/ML IJ SOLN
INTRAMUSCULAR | Status: AC
  Filled 2020-12-06: qty 1

## 2020-12-06 MED ORDER — SODIUM CHLORIDE 0.9 % IV SOLN
175.0000 mg/m2 | Freq: Once | INTRAVENOUS | Status: AC
  Administered 2020-12-06: 294 mg via INTRAVENOUS
  Filled 2020-12-06: qty 49

## 2020-12-06 MED ORDER — FAMOTIDINE IN NACL 20-0.9 MG/50ML-% IV SOLN
INTRAVENOUS | Status: AC
  Filled 2020-12-06: qty 50

## 2020-12-06 MED ORDER — DENOSUMAB 120 MG/1.7ML ~~LOC~~ SOLN
120.0000 mg | Freq: Once | SUBCUTANEOUS | Status: AC
  Administered 2020-12-06: 120 mg via SUBCUTANEOUS

## 2020-12-06 MED ORDER — PALONOSETRON HCL INJECTION 0.25 MG/5ML
INTRAVENOUS | Status: AC
  Filled 2020-12-06: qty 5

## 2020-12-06 MED ORDER — SODIUM CHLORIDE 0.9 % IV SOLN
200.0000 mg | Freq: Once | INTRAVENOUS | Status: AC
  Administered 2020-12-06: 200 mg via INTRAVENOUS
  Filled 2020-12-06: qty 8

## 2020-12-06 MED ORDER — SODIUM CHLORIDE 0.9 % IV SOLN
INTRAVENOUS | Status: DC
  Filled 2020-12-06: qty 250

## 2020-12-06 NOTE — Progress Notes (Signed)
Patient was identified to be at risk for malnutrition on the MST secondary to poor appetite weight loss.  Patient is a 55 year old male diagnosed with metastatic lung cancer followed by Dr. Julien Nordmann receiving concurrent chemoradiation therapy.  Past medical history includes depression, hypertension, GERD, COPD, and anxiety.  Medications include Nexium, multivitamin, and Compazine.  Labs include glucose 111 and albumin 2.7 on January 27.  Height: 5 feet 10 inches. Weight: 121 pounds January 27. Usual body weight: 177 pounds in March 2018 and 140 pounds December 2021. BMI: 17.51.  Noted patient has early signs of sacral ulcer. He reports his appetite varies.   Reports he suffers from constipation most of the time He drinks 1 Carnation breakfast essential daily and 1 other protein drink. Reports he drinks 2-316 ounce bottles of water daily. Patient has lost 14% body weight in less than 2 months.  Nutrition diagnosis:  Severe malnutrition related to metastatic cancer as evidenced by 14% weight loss and severe depletion of muscle mass and body fat.  Intervention: Educated patient on the importance of smaller more frequent meals and snacks with higher calorie, higher protein foods. Recommended patient increase Carnation breakfast and protein drinks to 4 times daily. Educated on strategies to improve constipation. Encourage patient to continue increased fluid intake. I provided fact sheets.  Questions were answered.  Teach back method used.  Contact information provided.  Monitoring, evaluation, goals: Patient will tolerate increased calories and protein to minimize weight loss and support healing.  Next visit: To be scheduled as needed.  **Disclaimer: This note was dictated with voice recognition software. Similar sounding words can inadvertently be transcribed and this note may contain transcription errors which may not have been corrected upon publication of note.**

## 2020-12-06 NOTE — Progress Notes (Signed)
[place] [phone]   [phone]  OFFICE PROGRESS NOTE  [name], [name], MD [place] [address]  DIAGNOSIS:  1) Stage IV (T3, N3, M1a) non-small cell lung cancer, squamous cell carcinoma presented with large left hilar mass in addition to mediastinal and left supraclavicular lymphadenopathy as well as contralateral right upper lobe nodule diagnosed in August 2018.  He has disease recurrence in December 2021. PDL 1 expression: 90%. 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 First dose 06/29/2017. Status post 6 cycles. Last dose was given 08/03/2017. 2)  Immunotherapy with Ketruda 200 MG IV every 3 weeks, first dose 09/17/2017.  Status post 35 cycles.  Last dose was September 01, 2019. 3) palliative radiotherapy to the painful lesions in the left fifth rib under the care of Dr. Tammi Klippel. 4) palliative radiotherapy to the left hip under the care of Dr. Tammi Klippel.  CURRENT THERAPY: Systemic chemotherapy with carboplatin for AUC of 5, paclitaxel 175 mg/M2 and Keytruda 200 mg IV every 3 weeks with Neulasta support.  First dose November 16, 2020.  INTERVAL HISTORY: Trevor Zavala 55 y.o. male returns to the clinic today for follow-up visit accompanied by his wife.  The patient continues to complain of increasing fatigue and weakness as well as the pain on the right shoulder and left hip.  He was able to maintain his weight over the last few weeks.  He denied having any current chest pain, shortness of breath, cough or hemoptysis.  He denied having any current nausea, vomiting, diarrhea but has constipation secondary to his treatment with narcotic.  His pain is reasonably controlled with the fentanyl patch and the breakthrough pain medication.  He also started having some rash and early signs of sacral  ulcer.  He tolerated the first cycle of his treatment fairly well.  The patient is here today for evaluation before starting cycle #2.  MEDICAL HISTORY: Past Medical History:  Diagnosis Date  . Anxiety   . COPD (chronic obstructive pulmonary disease) ([place])   . GERD (gastroesophageal reflux disease)   . Headache   . Hypertension   . Pneumonia   . Situational depression   . Stage III squamous cell carcinoma of left lung ([place]) 06/19/2017   Lungs & Epiglottis  . Tubular adenoma of colon 01/2015  . Tubular adenoma of colon 01/2015    ALLERGIES:  is allergic to ativan [lorazepam] and escitalopram oxalate.  MEDICATIONS:  Current Outpatient Medications  Medication Sig Dispense Refill  . BREO ELLIPTA 100-25 MCG/INH AEPB INHALE 1 PUFF INTO THE LUNGS DAILY. ANNUAL APPT IS OVERDUE MUST SEE PROVIDER FOR FUTURE REFILLS 60 each 0  . clonazePAM (KLONOPIN) 0.5 MG tablet TAKE 1 OR 2 TABLETS BY MOUTH TWICE A DAY AS NEEDED FOR ANXIETY (Patient taking differently: Take 0.25 mg by mouth daily.) 180 tablet 0  . docusate sodium (COLACE) 100 MG capsule Take 200 mg by mouth daily.    Marland Kitchen esomeprazole (NEXIUM) 40 MG capsule TAKE 1 CAPSULE BY MOUTH EVERY DAY IN THE MORNING (Patient taking differently: Take 40 mg by mouth daily.) 90 capsule 3  . fentaNYL (DURAGESIC) 75 MCG/HR Place 1 patch onto the skin every 3 (three) days. 5 patch 0  . folic acid (FOLVITE) 1 MG tablet Take 1 mg by mouth daily.    . Multiple Vitamin (MULTIVITAMIN  WITH MINERALS) TABS tablet Take 1 tablet by mouth daily.    . Oxycodone HCl 10 MG TABS Take 1 tablet (10 mg total) by mouth every 4 (four) hours as needed. 40 tablet 0  . prochlorperazine (COMPAZINE) 10 MG tablet Take 1 tablet (10 mg total) by mouth every 6 (six) hours as needed for nausea or vomiting. 30 tablet 5   No current facility-administered medications for this visit.    SURGICAL HISTORY:  Past Surgical History:  Procedure Laterality Date  . COLONOSCOPY    . DIRECT  LARYNGOSCOPY N/A 06/25/2017   Procedure: DIRECT LARYNGOSCOPY AND BIOPSY;  Surgeon: Rozetta Nunnery, MD;  Location: [facility];  Service: ENT;  Laterality: N/A;  . EAR CYST EXCISION N/A 11/17/2013   Procedure: SEBACEOUS CYST CHEST;  Surgeon: Joyice Faster. Cornett, MD;  Location: [facility];  Service: General;  Laterality: N/A;  . IR GASTROSTOMY TUBE MOD SED  08/10/2017  . IR GASTROSTOMY TUBE REMOVAL  09/29/2017  . KNEE ARTHROSCOPY     LEFT  . LIPOMA EXCISION N/A 11/17/2013   Procedure: EXCISION LIPOMA FOREHEAD;  Surgeon: Joyice Faster. Cornett, MD;  Location: [facility];  Service: General;  Laterality: N/A;  . LUNG BIOPSY Bilateral 06/12/2017   Procedure: LEFT LUNG BIOPSY;  Surgeon: Grace Isaac, MD;  Location: [facility];  Service: Thoracic;  Laterality: Bilateral;  . MICROLARYNGOSCOPY W/VOCAL CORD INJECTION Left 07/16/2018   Procedure: suspened micro laryngoscopy with jet ventilation and prolaryn injection;  Surgeon: Melida Quitter, MD;  Location: [facility];  Service: ENT;  Laterality: Left;  . PORTACATH PLACEMENT Right 07/01/2017   Procedure: INSERTION PORT-A-CATH - RIGHT IJ - placed with Fluoro and Ultrasound;  Surgeon: Grace Isaac, MD;  Location: [facility];  Service: Thoracic;  Laterality: Right;  Marland Kitchen VIDEO BRONCHOSCOPY WITH ENDOBRONCHIAL ULTRASOUND N/A 06/12/2017   Procedure: VIDEO BRONCHOSCOPY WITH ENDOBRONCHIAL ULTRASOUND;  Surgeon: Grace Isaac, MD;  Location: [facility];  Service: Thoracic;  Laterality: N/A;    REVIEW OF SYSTEMS:  Constitutional: positive for fatigue Eyes: negative Ears, nose, mouth, throat, and face: negative Respiratory: negative Cardiovascular: negative Gastrointestinal: negative Genitourinary:negative Integument/breast: negative Hematologic/lymphatic: negative Musculoskeletal:positive for bone pain and muscle weakness Neurological: negative Behavioral/Psych: negative Endocrine: negative Allergic/Immunologic: negative    PHYSICAL EXAMINATION: General appearance: alert, cooperative, fatigued and no distress Head: Normocephalic, without obvious abnormality, atraumatic Neck: no adenopathy, no JVD, supple, symmetrical, trachea midline and thyroid not enlarged, symmetric, no tenderness/mass/nodules Lymph nodes: Cervical, supraclavicular, and axillary nodes normal. Resp: clear to auscultation bilaterally Back: symmetric, no curvature. ROM normal. No CVA tenderness. Cardio: regular rate and rhythm, S1, S2 normal, no murmur, click, rub or gallop GI: soft, non-tender; bowel sounds normal; no masses,  no organomegaly Extremities: extremities normal, atraumatic, no cyanosis or edema Neurologic: Alert and oriented X 3, normal strength and tone. Normal symmetric reflexes. Normal coordination and gait  ECOG PERFORMANCE STATUS: 1 - Symptomatic but completely ambulatory  Blood pressure (!) 78/54, pulse 86, temperature 98.1 F (36.7 C), temperature source Tympanic, resp. rate 16, height 5\' 10"  (1.778 m), weight 121 lb (54.9 kg), SpO2 97 %.  LABORATORY DATA: Lab Results  Component Value Date   WBC 9.0 12/06/2020   HGB 9.2 (L) 12/06/2020   HCT 29.2 (L) 12/06/2020   MCV 88.2 12/06/2020   PLT 329 12/06/2020      Chemistry      Component Value Date/Time   NA 136 11/29/2020 1106   NA 141 10/29/2017 1049  K 3.7 11/29/2020 1106   K 3.3 (L) 10/29/2017 1049   CL 97 (L) 11/29/2020 1106   CO2 30 11/29/2020 1106   CO2 26 10/29/2017 1049   BUN 13 11/29/2020 1106   BUN 11.2 10/29/2017 1049   CREATININE 0.65 11/29/2020 1106   CREATININE 0.7 10/29/2017 1049   GLU 198 08/25/2017 0000      Component Value Date/Time   CALCIUM 11.6 (H) 11/29/2020 1106   CALCIUM 9.0 10/29/2017 1049   ALKPHOS 157 (H) 11/29/2020 1106   ALKPHOS 82 10/29/2017 1049   AST 19 11/29/2020 1106   AST 10 10/29/2017 1049   ALT 28 11/29/2020 1106   ALT 8 10/29/2017 1049   BILITOT 0.5 11/29/2020 1106   BILITOT 0.22 10/29/2017 1049        RADIOGRAPHIC STUDIES: MR BRAIN W WO CONTRAST  Result Date: 11/06/2020 CLINICAL DATA:  Non-small cell lung cancer, staging. EXAM: MRI HEAD WITHOUT AND WITH CONTRAST TECHNIQUE: Multiplanar, multiecho pulse sequences of the brain and surrounding structures were obtained without and with intravenous contrast. CONTRAST:  61mL GADAVIST GADOBUTROL 1 MMOL/ML IV SOLN COMPARISON:  10/19/2020 and prior. FINDINGS: Brain: Multifocal nonenhancing restricted diffusion/T2 hyperintensity involving the left cerebellum measuring up to 8 mm. No intracranial hemorrhage. No midline shift, ventriculomegaly or extra-axial fluid collection. Centrally necrotic enhancing left frontal calvarial lesion measuring 4.3 x 3.2 cm with underlying dural thickening (15:49). Abnormal enhancing tissue measuring 2.8 x 2.8 cm (15:24) involving the left temporal fossa/temporalis muscle with underlying dural thickening and enhancement. Enhancing midline parietal calvarial lesion measuring 3.4 x 1.5 cm (15:33) with underlying dural thickening and enhancement. Asymmetric prominence of the lateral left parietal calvarium measuring 2.3 x 0.4 cm (15:33) may reflect an additional osseous metastases. Vascular: Normal flow voids. Skull and upper cervical spine: Please see above. Sinuses/Orbits: Normal orbits. Minimal left maxillary sinus mucosal thickening. No mastoid effusion. Other: None. IMPRESSION: Multifocal left cerebellar restricted diffusion concerning for acute/subacute infarcts versus evolving metastatic lesions. Multifocal calvarial lesions with underlying dural metastases involving the left frontal, midline parietal and left temporal regions. Prominence of the lateral left parietal calvarium may reflect a smaller osseous metastatic lesion. These results will be called to the ordering clinician or representative by the Radiologist Assistant, and communication documented in the PACS or [employer]. Electronically Signed   By: Primitivo Gauze M.D.   On: 11/06/2020 13:12   NM PET Image Restag (PS) Skull Base To Thigh  Result Date: 11/06/2020 CLINICAL DATA:  Subsequent treatment strategy for non-small cell lung cancer. Suspicion of recurrence. EXAM: NUCLEAR MEDICINE PET SKULL BASE TO THIGH TECHNIQUE: 7.4 mCi F-18 FDG was injected intravenously. Full-ring PET imaging was performed from the skull base to thigh after the radiotracer. CT data was obtained and used for attenuation correction and anatomic localization. Fasting blood glucose: 87 mg/dl COMPARISON:  PET-CT 08/29/2019, CTs of the chest, abdomen and pelvis 10/19/2020 and abdominal MRI 10/20/2020. FINDINGS: Mediastinal blood pool activity: SUV max 1.7 NECK: There is a new hypermetabolic nodule posterior to the right angle of the mandible, measuring 1.8 cm on image 36/4 (SUV max 12.2). This is likely a lymph node. There is asymmetric hypermetabolic activity within the left oropharynx (SUV max 7.8) without clear corresponding mucosal abnormality on the CT images. There is asymmetrically increased activity within the right local cord which may relate to left vocal cord paralysis. Incidental CT findings: Bilateral carotid atherosclerosis. CHEST: There are multiple hypermetabolic mediastinal and hilar lymph nodes bilaterally. Subcarinal nodal mass has an SUV max  of 14.7. Lymph nodes in the right hilum have an SUV max of 10.5, and in the left hilum 14.2. There are multiple hypermetabolic pulmonary nodules bilaterally. There is a central infrahilar right lower lobe nodule with an SUV max of 10.9. There is an approximately 1.6 cm nodule anteriorly in the left upper lobe which is hypermetabolic (SUV max 8.8). Other small hypermetabolic central left lung nodules are present. There is new patchy right upper lobe airspace disease with associated low level hypermetabolic activity, likely inflammatory/infectious. There are small foci of hypermetabolic activity within the left pleural space, suspicious  for pleural metastases. The most hypermetabolic nodule projects over the left hemidiaphragm and has an SUV max of 9.2. This is not well localized on the CT images and could be subdiaphragmatic. No suspicious for activity on the right. Incidental CT findings: Small bilateral pleural effusions. As above, new patchy airspace disease in the right upper lobe, likely inflammatory/infectious. Right IJ Port-A-Cath extends to the superior cavoatrial junction. ABDOMEN/PELVIS: There is no hypermetabolic activity within the liver, adrenal glands, spleen or pancreas. However, there is intense hypermetabolic activity associated with the gallbladder (SUV max 25.0). As above, there is focal hypermetabolic activity near the left hemidiaphragm which does not localize well on the CT images, probably a pleural-based metastasis. There is a hypermetabolic soft tissue nodule medial to the left kidney, measuring 2.3 cm on image 135/4 (SUV max 15.5). There is irregular wall thickening of the cecum laterally with associated hypermetabolic activity. This has an SUV max of 19.5 and could reflect a peritoneal implant or primary colon cancer. Incidental CT findings: Stable small left adrenal nodule consistent with an adenoma. Aortic and branch vessel atherosclerosis. SKELETON: Widespread hypermetabolic osseous metastatic disease. There are hypermetabolic lesions within the left frontal parietal and occipital skull. There are bilateral scapular lesions, larger on the right (SUV max 22.0). There are bilateral rib lesions. There are multifocal lesions within the spine, most prominent in the L5 vertebral body (SUV max 9.6, with associated pathologic fracture), and the at the right T5 and T7 costovertebral junctions. There are large lytic hypermetabolic lesions within the pelvis bilaterally involving both iliac bones, including the left acetabulum and left pubic rami. Representative lytic lesions include a 3.0 cm posteriorly in the right iliac bone  on image 175/4 and a 3.3 cm lesion in the left iliac bone on image 169/4. Incidental CT findings: none IMPRESSION: 1. Widespread hypermetabolic metastases as described. Sites of involvement include right retromalleolar, mediastinal and bilateral hilar lymph nodes; bilateral pulmonary nodules; the left pleural space; a retroperitoneal nodule medial to the left kidney; the gallbladder; and multiple osseous metastases as detailed above. 2. In addition, there is focal hypermetabolic activity along the lateral aspect of the cecum associated with focal wall thickening which could reflect a peritoneal implant or primary cecal carcinoma. No evidence of bowel obstruction or perforation. 3. New patchy right upper lobe airspace disease with hypermetabolic activity, likely inflammatory/infectious. Electronically Signed   By: Richardean Sale M.D.   On: 11/06/2020 16:56    ASSESSMENT AND PLAN: This is a very pleasant 55 years old white male with recently diagnosed stage IIIB/IV non-small cell lung cancer, squamous cell carcinoma presented with large left hilar mass in addition to mediastinal and left supraclavicular lymphadenopathy as well as suspicious right upper lobe pulmonary nodule diagnosed in August 2018. 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 He underwent a course of concurrent chemoradiation to the lung as well as the epiglottic  area under the care of Dr. Tammi Klippel. He is status post 6 cycle. He tolerated this course of treatment well except for the radiation induced esophagitis as well as weight loss and fatigue. The patient had partial response to the previous treatment. He completed treatment with Keytruda 200 mg IV every 3 weeks status post 35 cycles. 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 Recent imaging studies including CT of the  chest as well as PET scan showed widespread metastasis involving the right retromolleolar, mediastinal and bilateral hilar lymph nodes, bilateral pulmonary nodules as well as left pleural space and retroperitoneal nodule medial to the left kidney, the gallbladder and multiple osseous metastasis.  The MRI of the brain showed central necrotic enhancing left frontal calvarial lesion measuring 4.3 x 3.2 cm with underlying dural thickening.  There was also abnormal enhancing tissue measuring 2.8 x 2 point centimeters involving the left temporal foss/temporalis muscle with underlying dural thickening and enhancement as part of multifocal calvarial lesions with underlying dural metastasis involving the left frontal, midline parietal and left temporal regions.  There was also multifocal left cerebellar restricted diffusion concerning for acute/subacute infarcts versus evolving metastatic lesions. The patient started systemic chemotherapy with carboplatin for AUC of 5, paclitaxel 175 mg/M2 and Keytruda 200 mg IV every 3 weeks with Neulasta support status post 1 cycle. The patient tolerated the first cycle of this treatment well with no concerning adverse effects. I recommended for him to proceed with cycle #2 today as planned. He also underwent palliative radiotherapy to some of the painful bone lesions under the care of Dr. Tammi Klippel.  I also recommended for him to talk to Dr. Tammi Klippel about palliative radiotherapy to the right shoulder and the left hip.  He has an appointment with him next week. For pain management he will continue his current treatment with the fentanyl patch and oxycodone for breakthrough pain management. For the hypotension, I encouraged the patient to increase his oral intake and will give the patient 1 L of normal saline in the clinic today. The patient will come back for follow-up visit in 3 weeks for evaluation before starting cycle #3. He was advised to call immediately if he has any other  concerning symptoms in the interval. The patient voices understanding of current disease status and treatment options and is in agreement with the current care plan.  All questions were answered. The patient knows to call the clinic with any problems, questions or concerns. We can certainly see the patient much sooner if necessary.  The total time spent in the appointment was 35 minutes.  Disclaimer: This note was dictated with voice recognition software. Similar sounding words can inadvertently be transcribed and may not be corrected upon review.

## 2020-12-06 NOTE — Progress Notes (Addendum)
Per Dr Julien Nordmann , please administer one liter of  normal saline over 1 hour.

## 2020-12-06 NOTE — Patient Instructions (Signed)
[facility] Discharge Instructions for Patients Receiving Chemotherapy  Today you received the following chemotherapy agents: pembrolizumab, paclitaxel, and carboplatin.  To help prevent nausea and vomiting after your treatment, we encourage you to take your nausea medication as directed.   If you develop nausea and vomiting that is not controlled by your nausea medication, call the clinic.   BELOW ARE SYMPTOMS THAT SHOULD BE REPORTED IMMEDIATELY:  *FEVER GREATER THAN 100.5 F  *CHILLS WITH OR WITHOUT FEVER  NAUSEA AND VOMITING THAT IS NOT CONTROLLED WITH YOUR NAUSEA MEDICATION  *UNUSUAL SHORTNESS OF BREATH  *UNUSUAL BRUISING OR BLEEDING  TENDERNESS IN MOUTH AND THROAT WITH OR WITHOUT PRESENCE OF ULCERS  *URINARY PROBLEMS  *BOWEL PROBLEMS  UNUSUAL RASH Items with * indicate a potential emergency and should be followed up as soon as possible.  Feel free to call the clinic should you have any questions or concerns. The clinic phone number is [phone].  Please show the [facility] at check-in to the Emergency Department and triage nurse.  Denosumab injection What is this medicine? DENOSUMAB (den oh sue mab) slows bone breakdown. Prolia is used to treat osteoporosis in women after menopause and in men, and in people who are taking corticosteroids for 6 months or more. Delton See is used to treat a high calcium level due to cancer and to prevent bone fractures and other bone problems caused by multiple myeloma or cancer bone metastases. Delton See is also used to treat giant cell tumor of the bone. This medicine may be used for other purposes; ask your health care provider or pharmacist if you have questions. COMMON BRAND NAME(S): Prolia, XGEVA What should I tell my health care provider before I take this medicine? They need to know if you have any of these conditions:  dental disease  having surgery or tooth extraction  infection  kidney disease  low levels  of calcium or Vitamin D in the blood  malnutrition  on hemodialysis  skin conditions or sensitivity  thyroid or parathyroid disease  an unusual reaction to denosumab, other medicines, foods, dyes, or preservatives  pregnant or trying to get pregnant  breast-feeding How should I use this medicine? This medicine is for injection under the skin. It is given by a health care professional in a hospital or clinic setting. A special MedGuide will be given to you before each treatment. Be sure to read this information carefully each time. For Prolia, talk to your pediatrician regarding the use of this medicine in children. Special care may be needed. For Delton See, talk to your pediatrician regarding the use of this medicine in children. While this drug may be prescribed for children as young as 13 years for selected conditions, precautions do apply. Overdosage: If you think you have taken too much of this medicine contact a poison control center or emergency room at once. NOTE: This medicine is only for you. Do not share this medicine with others. What if I miss a dose? It is important not to miss your dose. Call your doctor or health care professional if you are unable to keep an appointment. What may interact with this medicine? Do not take this medicine with any of the following medications:  other medicines containing denosumab This medicine may also interact with the following medications:  medicines that lower your chance of fighting infection  steroid medicines like prednisone or cortisone This list may not describe all possible interactions. Give your health care provider a list of all the  medicines, herbs, non-prescription drugs, or dietary supplements you use. Also tell them if you smoke, drink alcohol, or use illegal drugs. Some items may interact with your medicine. What should I watch for while using this medicine? Visit your doctor or health care professional for regular checks  on your progress. Your doctor or health care professional may order blood tests and other tests to see how you are doing. Call your doctor or health care professional for advice if you get a fever, chills or sore throat, or other symptoms of a cold or flu. Do not treat yourself. This drug may decrease your body's ability to fight infection. Try to avoid being around people who are sick. You should make sure you get enough calcium and vitamin D while you are taking this medicine, unless your doctor tells you not to. Discuss the foods you eat and the vitamins you take with your health care professional. See your dentist regularly. Brush and floss your teeth as directed. Before you have any dental work done, tell your dentist you are receiving this medicine. Do not become pregnant while taking this medicine or for 5 months after stopping it. Talk with your doctor or health care professional about your birth control options while taking this medicine. Women should inform their doctor if they wish to become pregnant or think they might be pregnant. There is a potential for serious side effects to an unborn child. Talk to your health care professional or pharmacist for more information. What side effects may I notice from receiving this medicine? Side effects that you should report to your doctor or health care professional as soon as possible:  allergic reactions like skin rash, itching or hives, swelling of the face, lips, or tongue  bone pain  breathing problems  dizziness  jaw pain, especially after dental work  redness, blistering, peeling of the skin  signs and symptoms of infection like fever or chills; cough; sore throat; pain or trouble passing urine  signs of low calcium like fast heartbeat, muscle cramps or muscle pain; pain, tingling, numbness in the hands or feet; seizures  unusual bleeding or bruising  unusually weak or tired Side effects that usually do not require medical  attention (report to your doctor or health care professional if they continue or are bothersome):  constipation  diarrhea  headache  joint pain  loss of appetite  muscle pain  runny nose  tiredness  upset stomach This list may not describe all possible side effects. Call your doctor for medical advice about side effects. You may report side effects to FDA at 1-800-FDA-1088. Where should I keep my medicine? This medicine is only given in a clinic, doctor's office, or other health care setting and will not be stored at home. NOTE: This sheet is a summary. It may not cover all possible information. If you have questions about this medicine, talk to your doctor, pharmacist, or health care provider.  2021 Elsevier/Gold Standard (2018-03-05 16:10:44)

## 2020-12-07 NOTE — Progress Notes (Signed)
Actual discharge occurred on 12/06/2020 @ 1505

## 2020-12-08 ENCOUNTER — Other Ambulatory Visit: Payer: Self-pay

## 2020-12-08 ENCOUNTER — Inpatient Hospital Stay: Payer: 59

## 2020-12-08 VITALS — BP 91/58 | HR 103 | Temp 97.7°F | Resp 17

## 2020-12-08 DIAGNOSIS — C3412 Malignant neoplasm of upper lobe, left bronchus or lung: Secondary | ICD-10-CM

## 2020-12-08 DIAGNOSIS — Z5111 Encounter for antineoplastic chemotherapy: Secondary | ICD-10-CM | POA: Diagnosis not present

## 2020-12-08 MED ORDER — PEGFILGRASTIM-BMEZ 6 MG/0.6ML ~~LOC~~ SOSY
6.0000 mg | PREFILLED_SYRINGE | Freq: Once | SUBCUTANEOUS | Status: AC
  Administered 2020-12-08: 6 mg via SUBCUTANEOUS

## 2020-12-08 NOTE — Patient Instructions (Signed)
Pegfilgrastim injection What is this medicine? PEGFILGRASTIM (PEG fil gra stim) is a long-acting granulocyte colony-stimulating factor that stimulates the growth of neutrophils, a type of white blood cell important in the body's fight against infection. It is used to reduce the incidence of fever and infection in patients with certain types of cancer who are receiving chemotherapy that affects the bone marrow, and to increase survival after being exposed to high doses of radiation. This medicine may be used for other purposes; ask your health care provider or pharmacist if you have questions. COMMON BRAND NAME(S): Fulphila, Neulasta, Nyvepria, UDENYCA, Ziextenzo What should I tell my health care provider before I take this medicine? They need to know if you have any of these conditions:  kidney disease  latex allergy  ongoing radiation therapy  sickle cell disease  skin reactions to acrylic adhesives (On-Body Injector only)  an unusual or allergic reaction to pegfilgrastim, filgrastim, other medicines, foods, dyes, or preservatives  pregnant or trying to get pregnant  breast-feeding How should I use this medicine? This medicine is for injection under the skin. If you get this medicine at home, you will be taught how to prepare and give the pre-filled syringe or how to use the On-body Injector. Refer to the patient Instructions for Use for detailed instructions. Use exactly as directed. Tell your healthcare provider immediately if you suspect that the On-body Injector may not have performed as intended or if you suspect the use of the On-body Injector resulted in a missed or partial dose. It is important that you put your used needles and syringes in a special sharps container. Do not put them in a trash can. If you do not have a sharps container, call your pharmacist or healthcare provider to get one. Talk to your pediatrician regarding the use of this medicine in children. While this drug  may be prescribed for selected conditions, precautions do apply. Overdosage: If you think you have taken too much of this medicine contact a poison control center or emergency room at once. NOTE: This medicine is only for you. Do not share this medicine with others. What if I miss a dose? It is important not to miss your dose. Call your doctor or health care professional if you miss your dose. If you miss a dose due to an On-body Injector failure or leakage, a new dose should be administered as soon as possible using a single prefilled syringe for manual use. What may interact with this medicine? Interactions have not been studied. This list may not describe all possible interactions. Give your health care provider a list of all the medicines, herbs, non-prescription drugs, or dietary supplements you use. Also tell them if you smoke, drink alcohol, or use illegal drugs. Some items may interact with your medicine. What should I watch for while using this medicine? Your condition will be monitored carefully while you are receiving this medicine. You may need blood work done while you are taking this medicine. Talk to your health care provider about your risk of cancer. You may be more at risk for certain types of cancer if you take this medicine. If you are going to need a MRI, CT scan, or other procedure, tell your doctor that you are using this medicine (On-Body Injector only). What side effects may I notice from receiving this medicine? Side effects that you should report to your doctor or health care professional as soon as possible:  allergic reactions (skin rash, itching or hives, swelling of   the face, lips, or tongue)  back pain  dizziness  fever  pain, redness, or irritation at site where injected  pinpoint red spots on the skin  red or dark-brown urine  shortness of breath or breathing problems  stomach or side pain, or pain at the shoulder  swelling  tiredness  trouble  passing urine or change in the amount of urine  unusual bruising or bleeding Side effects that usually do not require medical attention (report to your doctor or health care professional if they continue or are bothersome):  bone pain  muscle pain This list may not describe all possible side effects. Call your doctor for medical advice about side effects. You may report side effects to FDA at 1-800-FDA-1088. Where should I keep my medicine? Keep out of the reach of children. If you are using this medicine at home, you will be instructed on how to store it. Throw away any unused medicine after the expiration date on the label. NOTE: This sheet is a summary. It may not cover all possible information. If you have questions about this medicine, talk to your doctor, pharmacist, or health care provider.  2021 Elsevier/Gold Standard (2019-11-18 13:20:51)  

## 2020-12-11 ENCOUNTER — Telehealth: Payer: Self-pay

## 2020-12-11 ENCOUNTER — Telehealth: Payer: Self-pay | Admitting: Internal Medicine

## 2020-12-11 NOTE — Telephone Encounter (Signed)
Scheduled per 1/27 los. Pt will receive an updated appt calendar per next appt notes

## 2020-12-11 NOTE — Telephone Encounter (Signed)
Pt left a message requesting a refill of the following:  Fentanyl patches Oxycondone

## 2020-12-12 ENCOUNTER — Other Ambulatory Visit: Payer: Self-pay | Admitting: Internal Medicine

## 2020-12-12 ENCOUNTER — Encounter: Payer: Self-pay | Admitting: Gastroenterology

## 2020-12-12 DIAGNOSIS — G893 Neoplasm related pain (acute) (chronic): Secondary | ICD-10-CM

## 2020-12-12 MED ORDER — FENTANYL 75 MCG/HR TD PT72: 1.0000 | MEDICATED_PATCH | TRANSDERMAL | 0 refills | Status: DC

## 2020-12-12 MED ORDER — OXYCODONE HCL 10 MG PO TABS: 10.0000 mg | ORAL_TABLET | ORAL | 0 refills | Status: DC | PRN

## 2020-12-12 MED FILL — fentaNYL 75 MCG/HR PT72: 75 | 15 days supply | Qty: 5 | Fill #0

## 2020-12-12 MED FILL — oxyCODONE HCL 10 MG TABS: 10 | 6 days supply | Qty: 40 | Fill #0

## 2020-12-13 ENCOUNTER — Inpatient Hospital Stay: Payer: 59 | Attending: Internal Medicine

## 2020-12-13 ENCOUNTER — Other Ambulatory Visit: Payer: Self-pay | Admitting: Internal Medicine

## 2020-12-13 ENCOUNTER — Other Ambulatory Visit: Payer: Self-pay

## 2020-12-13 DIAGNOSIS — G893 Neoplasm related pain (acute) (chronic): Secondary | ICD-10-CM | POA: Diagnosis not present

## 2020-12-13 DIAGNOSIS — Z9221 Personal history of antineoplastic chemotherapy: Secondary | ICD-10-CM | POA: Insufficient documentation

## 2020-12-13 DIAGNOSIS — Z923 Personal history of irradiation: Secondary | ICD-10-CM | POA: Insufficient documentation

## 2020-12-13 DIAGNOSIS — C3402 Malignant neoplasm of left main bronchus: Secondary | ICD-10-CM | POA: Insufficient documentation

## 2020-12-13 DIAGNOSIS — Z87891 Personal history of nicotine dependence: Secondary | ICD-10-CM | POA: Diagnosis not present

## 2020-12-13 DIAGNOSIS — C7951 Secondary malignant neoplasm of bone: Secondary | ICD-10-CM | POA: Diagnosis not present

## 2020-12-13 DIAGNOSIS — Z5111 Encounter for antineoplastic chemotherapy: Secondary | ICD-10-CM | POA: Insufficient documentation

## 2020-12-13 DIAGNOSIS — C321 Malignant neoplasm of supraglottis: Secondary | ICD-10-CM | POA: Diagnosis not present

## 2020-12-13 DIAGNOSIS — C7931 Secondary malignant neoplasm of brain: Secondary | ICD-10-CM | POA: Diagnosis not present

## 2020-12-13 DIAGNOSIS — I639 Cerebral infarction, unspecified: Secondary | ICD-10-CM | POA: Insufficient documentation

## 2020-12-13 DIAGNOSIS — Z5112 Encounter for antineoplastic immunotherapy: Secondary | ICD-10-CM | POA: Diagnosis not present

## 2020-12-13 DIAGNOSIS — Z79891 Long term (current) use of opiate analgesic: Secondary | ICD-10-CM | POA: Insufficient documentation

## 2020-12-13 DIAGNOSIS — Z5189 Encounter for other specified aftercare: Secondary | ICD-10-CM | POA: Insufficient documentation

## 2020-12-13 DIAGNOSIS — Z7982 Long term (current) use of aspirin: Secondary | ICD-10-CM | POA: Insufficient documentation

## 2020-12-13 LAB — CBC WITH DIFFERENTIAL (CANCER CENTER ONLY)
Abs Immature Granulocytes: 0.22 10*3/uL — ABNORMAL HIGH (ref 0.00–0.07)
Basophils Absolute: 0 10*3/uL (ref 0.0–0.1)
Basophils Relative: 0 %
Eosinophils Absolute: 0 10*3/uL (ref 0.0–0.5)
Eosinophils Relative: 0 %
HCT: 29 % — ABNORMAL LOW (ref 39.0–52.0)
Hemoglobin: 9.5 g/dL — ABNORMAL LOW (ref 13.0–17.0)
Immature Granulocytes: 1 %
Lymphocytes Relative: 2 %
Lymphs Abs: 0.3 10*3/uL — ABNORMAL LOW (ref 0.7–4.0)
MCH: 28.1 pg (ref 26.0–34.0)
MCHC: 32.8 g/dL (ref 30.0–36.0)
MCV: 85.8 fL (ref 80.0–100.0)
Monocytes Absolute: 1.3 10*3/uL — ABNORMAL HIGH (ref 0.1–1.0)
Monocytes Relative: 7 %
Neutro Abs: 15.8 10*3/uL — ABNORMAL HIGH (ref 1.7–7.7)
Neutrophils Relative %: 90 %
Platelet Count: 185 10*3/uL (ref 150–400)
RBC: 3.38 MIL/uL — ABNORMAL LOW (ref 4.22–5.81)
RDW: 15.4 % (ref 11.5–15.5)
WBC Count: 17.7 10*3/uL — ABNORMAL HIGH (ref 4.0–10.5)
nRBC: 0 % (ref 0.0–0.2)

## 2020-12-13 LAB — CMP (CANCER CENTER ONLY)
ALT: 25 U/L (ref 0–44)
AST: 12 U/L — ABNORMAL LOW (ref 15–41)
Albumin: 3 g/dL — ABNORMAL LOW (ref 3.5–5.0)
Alkaline Phosphatase: 125 U/L (ref 38–126)
Anion gap: 7 (ref 5–15)
BUN: 11 mg/dL (ref 6–20)
CO2: 31 mmol/L (ref 22–32)
Calcium: 10.5 mg/dL — ABNORMAL HIGH (ref 8.9–10.3)
Chloride: 99 mmol/L (ref 98–111)
Creatinine: 0.61 mg/dL (ref 0.61–1.24)
GFR, Estimated: >60 mL/min (ref >60)
Glucose, Bld: 106 mg/dL — ABNORMAL HIGH (ref 70–99)
Potassium: 4.4 mmol/L (ref 3.5–5.1)
Sodium: 137 mmol/L (ref 135–145)
Total Bilirubin: 0.3 mg/dL (ref 0.3–1.2)
Total Protein: 6.1 g/dL — ABNORMAL LOW (ref 6.5–8.1)

## 2020-12-13 LAB — TSH: TSH: 6.064 u[IU]/mL — ABNORMAL HIGH (ref 0.320–4.118)

## 2020-12-14 ENCOUNTER — Ambulatory Visit
Admission: RE | Admit: 2020-12-14 | Discharge: 2020-12-14 | Disposition: A | Discharge status: Home / Self Care | Payer: 59 | Source: Ambulatory Visit | Attending: Orthopedic Surgery | Admitting: Orthopedic Surgery

## 2020-12-14 ENCOUNTER — Other Ambulatory Visit: Payer: 59

## 2020-12-14 DIAGNOSIS — M25552 Pain in left hip: Secondary | ICD-10-CM

## 2020-12-18 ENCOUNTER — Encounter: Payer: Self-pay | Admitting: Gastroenterology

## 2020-12-20 ENCOUNTER — Ambulatory Visit: Payer: Self-pay | Admitting: Urology

## 2020-12-20 ENCOUNTER — Other Ambulatory Visit: Payer: Self-pay

## 2020-12-20 ENCOUNTER — Inpatient Hospital Stay: Payer: 59

## 2020-12-20 DIAGNOSIS — Z5112 Encounter for antineoplastic immunotherapy: Secondary | ICD-10-CM | POA: Diagnosis not present

## 2020-12-20 DIAGNOSIS — C321 Malignant neoplasm of supraglottis: Secondary | ICD-10-CM

## 2020-12-20 DIAGNOSIS — R5382 Chronic fatigue, unspecified: Secondary | ICD-10-CM

## 2020-12-20 LAB — CBC WITH DIFFERENTIAL (CANCER CENTER ONLY)
Abs Immature Granulocytes: 0.22 10*3/uL — ABNORMAL HIGH (ref 0.00–0.07)
Basophils Absolute: 0.1 10*3/uL (ref 0.0–0.1)
Basophils Relative: 0 %
Eosinophils Absolute: 0 10*3/uL (ref 0.0–0.5)
Eosinophils Relative: 0 %
HCT: 29.6 % — ABNORMAL LOW (ref 39.0–52.0)
Hemoglobin: 9.6 g/dL — ABNORMAL LOW (ref 13.0–17.0)
Immature Granulocytes: 1 %
Lymphocytes Relative: 1 %
Lymphs Abs: 0.2 10*3/uL — ABNORMAL LOW (ref 0.7–4.0)
MCH: 27.7 pg (ref 26.0–34.0)
MCHC: 32.4 g/dL (ref 30.0–36.0)
MCV: 85.3 fL (ref 80.0–100.0)
Monocytes Absolute: 0.5 10*3/uL (ref 0.1–1.0)
Monocytes Relative: 3 %
Neutro Abs: 15 10*3/uL — ABNORMAL HIGH (ref 1.7–7.7)
Neutrophils Relative %: 95 %
Platelet Count: 221 10*3/uL (ref 150–400)
RBC: 3.47 MIL/uL — ABNORMAL LOW (ref 4.22–5.81)
RDW: 16.3 % — ABNORMAL HIGH (ref 11.5–15.5)
WBC Count: 15.9 10*3/uL — ABNORMAL HIGH (ref 4.0–10.5)
nRBC: 0 % (ref 0.0–0.2)

## 2020-12-20 LAB — CMP (CANCER CENTER ONLY)
ALT: 19 U/L (ref 0–44)
AST: 10 U/L — ABNORMAL LOW (ref 15–41)
Albumin: 3 g/dL — ABNORMAL LOW (ref 3.5–5.0)
Alkaline Phosphatase: 150 U/L — ABNORMAL HIGH (ref 38–126)
Anion gap: 8 (ref 5–15)
BUN: 17 mg/dL (ref 6–20)
CO2: 29 mmol/L (ref 22–32)
Calcium: 10.9 mg/dL — ABNORMAL HIGH (ref 8.9–10.3)
Chloride: 100 mmol/L (ref 98–111)
Creatinine: 0.58 mg/dL — ABNORMAL LOW (ref 0.61–1.24)
GFR, Estimated: >60 mL/min (ref >60)
Glucose, Bld: 151 mg/dL — ABNORMAL HIGH (ref 70–99)
Potassium: 5.1 mmol/L (ref 3.5–5.1)
Sodium: 137 mmol/L (ref 135–145)
Total Bilirubin: 0.3 mg/dL (ref 0.3–1.2)
Total Protein: 6.6 g/dL (ref 6.5–8.1)

## 2020-12-20 LAB — TSH: TSH: 4.266 u[IU]/mL — ABNORMAL HIGH (ref 0.320–4.118)

## 2020-12-21 ENCOUNTER — Ambulatory Visit (HOSPITAL_COMMUNITY): Payer: 59

## 2020-12-21 ENCOUNTER — Ambulatory Visit (HOSPITAL_COMMUNITY): Admission: RE | Admit: 2020-12-21 | Payer: 59 | Source: Ambulatory Visit

## 2020-12-24 ENCOUNTER — Inpatient Hospital Stay: Payer: 59

## 2020-12-25 ENCOUNTER — Inpatient Hospital Stay: Payer: 59 | Admitting: Internal Medicine

## 2020-12-26 ENCOUNTER — Telehealth: Payer: Self-pay | Admitting: Medical Oncology

## 2020-12-26 ENCOUNTER — Other Ambulatory Visit: Payer: Self-pay | Admitting: Internal Medicine

## 2020-12-26 DIAGNOSIS — G893 Neoplasm related pain (acute) (chronic): Secondary | ICD-10-CM

## 2020-12-26 MED ORDER — OXYCODONE HCL 10 MG PO TABS: 10.0000 mg | ORAL_TABLET | ORAL | 0 refills | Status: DC | PRN

## 2020-12-26 MED ORDER — FENTANYL 75 MCG/HR TD PT72
1.0000 | MEDICATED_PATCH | TRANSDERMAL | 0 refills | Status: DC
Start: 2020-12-26 — End: 2021-01-10

## 2020-12-26 MED FILL — fentaNYL 75 MCG/HR PT72: 75 | 15 days supply | Qty: 5 | Fill #0

## 2020-12-26 MED FILL — oxyCODONE HCL 10 MG TABS: 10 | 6 days supply | Qty: 40 | Fill #0

## 2020-12-26 NOTE — Telephone Encounter (Signed)
Requests refill for fentanyl and oxycodone.

## 2020-12-27 ENCOUNTER — Inpatient Hospital Stay: Payer: 59

## 2020-12-27 ENCOUNTER — Other Ambulatory Visit: Payer: Self-pay

## 2020-12-27 ENCOUNTER — Encounter: Payer: Self-pay | Admitting: *Deleted

## 2020-12-27 ENCOUNTER — Inpatient Hospital Stay (HOSPITAL_BASED_OUTPATIENT_CLINIC_OR_DEPARTMENT_OTHER): Payer: 59 | Admitting: Internal Medicine

## 2020-12-27 VITALS — BP 98/54 | HR 89 | Temp 98.1°F | Resp 13 | Ht 70.0 in | Wt 109.1 lb

## 2020-12-27 DIAGNOSIS — Z5112 Encounter for antineoplastic immunotherapy: Secondary | ICD-10-CM | POA: Diagnosis not present

## 2020-12-27 DIAGNOSIS — C3412 Malignant neoplasm of upper lobe, left bronchus or lung: Secondary | ICD-10-CM

## 2020-12-27 DIAGNOSIS — C3490 Malignant neoplasm of unspecified part of unspecified bronchus or lung: Secondary | ICD-10-CM

## 2020-12-27 DIAGNOSIS — C321 Malignant neoplasm of supraglottis: Secondary | ICD-10-CM

## 2020-12-27 DIAGNOSIS — C7951 Secondary malignant neoplasm of bone: Secondary | ICD-10-CM

## 2020-12-27 DIAGNOSIS — Z5111 Encounter for antineoplastic chemotherapy: Secondary | ICD-10-CM

## 2020-12-27 LAB — CBC WITH DIFFERENTIAL (CANCER CENTER ONLY)
Abs Immature Granulocytes: 0.11 10*3/uL — ABNORMAL HIGH (ref 0.00–0.07)
Basophils Absolute: 0 10*3/uL (ref 0.0–0.1)
Basophils Relative: 0 %
Eosinophils Absolute: 0 10*3/uL (ref 0.0–0.5)
Eosinophils Relative: 0 %
HCT: 29.4 % — ABNORMAL LOW (ref 39.0–52.0)
Hemoglobin: 9.4 g/dL — ABNORMAL LOW (ref 13.0–17.0)
Immature Granulocytes: 1 %
Lymphocytes Relative: 1 %
Lymphs Abs: 0.2 10*3/uL — ABNORMAL LOW (ref 0.7–4.0)
MCH: 27.8 pg (ref 26.0–34.0)
MCHC: 32 g/dL (ref 30.0–36.0)
MCV: 87 fL (ref 80.0–100.0)
Monocytes Absolute: 0.8 10*3/uL (ref 0.1–1.0)
Monocytes Relative: 6 %
Neutro Abs: 12.4 10*3/uL — ABNORMAL HIGH (ref 1.7–7.7)
Neutrophils Relative %: 92 %
Platelet Count: 266 10*3/uL (ref 150–400)
RBC: 3.38 MIL/uL — ABNORMAL LOW (ref 4.22–5.81)
RDW: 18.2 % — ABNORMAL HIGH (ref 11.5–15.5)
WBC Count: 13.5 10*3/uL — ABNORMAL HIGH (ref 4.0–10.5)
nRBC: 0 % (ref 0.0–0.2)

## 2020-12-27 LAB — CMP (CANCER CENTER ONLY)
ALT: 27 U/L (ref 0–44)
AST: 14 U/L — ABNORMAL LOW (ref 15–41)
Albumin: 3.1 g/dL — ABNORMAL LOW (ref 3.5–5.0)
Alkaline Phosphatase: 122 U/L (ref 38–126)
Anion gap: 6 (ref 5–15)
BUN: 18 mg/dL (ref 6–20)
CO2: 28 mmol/L (ref 22–32)
Calcium: 9.7 mg/dL (ref 8.9–10.3)
Chloride: 101 mmol/L (ref 98–111)
Creatinine: 0.55 mg/dL — ABNORMAL LOW (ref 0.61–1.24)
GFR, Estimated: >60 mL/min (ref >60)
Glucose, Bld: 115 mg/dL — ABNORMAL HIGH (ref 70–99)
Potassium: 4 mmol/L (ref 3.5–5.1)
Sodium: 135 mmol/L (ref 135–145)
Total Bilirubin: 0.4 mg/dL (ref 0.3–1.2)
Total Protein: 6.2 g/dL — ABNORMAL LOW (ref 6.5–8.1)

## 2020-12-27 MED ORDER — SODIUM CHLORIDE 0.9 % IV SOLN
175.0000 mg/m2 | Freq: Once | INTRAVENOUS | Status: AC
  Administered 2020-12-27: 294 mg via INTRAVENOUS
  Filled 2020-12-27: qty 49

## 2020-12-27 MED ORDER — SODIUM CHLORIDE 0.9 % IV SOLN
150.0000 mg | Freq: Once | INTRAVENOUS | Status: AC
  Administered 2020-12-27: 150 mg via INTRAVENOUS
  Filled 2020-12-27: qty 150

## 2020-12-27 MED ORDER — DIPHENHYDRAMINE HCL 50 MG/ML IJ SOLN
INTRAMUSCULAR | Status: AC
  Filled 2020-12-27: qty 1

## 2020-12-27 MED ORDER — FAMOTIDINE IN NACL 20-0.9 MG/50ML-% IV SOLN
20.0000 mg | Freq: Once | INTRAVENOUS | Status: AC
  Administered 2020-12-27: 20 mg via INTRAVENOUS

## 2020-12-27 MED ORDER — SODIUM CHLORIDE 0.9% FLUSH
10.0000 mL | INTRAVENOUS | Status: DC | PRN
  Filled 2020-12-27: qty 10

## 2020-12-27 MED ORDER — SODIUM CHLORIDE 0.9 % IV SOLN
490.0000 mg | Freq: Once | INTRAVENOUS | Status: AC
  Administered 2020-12-27: 490 mg via INTRAVENOUS
  Filled 2020-12-27: qty 49

## 2020-12-27 MED ORDER — PALONOSETRON HCL INJECTION 0.25 MG/5ML
0.2500 mg | Freq: Once | INTRAVENOUS | Status: AC
  Administered 2020-12-27: 0.25 mg via INTRAVENOUS

## 2020-12-27 MED ORDER — FAMOTIDINE IN NACL 20-0.9 MG/50ML-% IV SOLN
INTRAVENOUS | Status: AC
  Filled 2020-12-27: qty 50

## 2020-12-27 MED ORDER — SODIUM CHLORIDE 0.9 % IV SOLN
200.0000 mg | Freq: Once | INTRAVENOUS | Status: AC
  Administered 2020-12-27: 200 mg via INTRAVENOUS
  Filled 2020-12-27: qty 8

## 2020-12-27 MED ORDER — SODIUM CHLORIDE 0.9 % IV SOLN
10.0000 mg | Freq: Once | INTRAVENOUS | Status: AC
  Administered 2020-12-27: 10 mg via INTRAVENOUS
  Filled 2020-12-27: qty 10

## 2020-12-27 MED ORDER — PALONOSETRON HCL INJECTION 0.25 MG/5ML
INTRAVENOUS | Status: AC
  Filled 2020-12-27: qty 5

## 2020-12-27 MED ORDER — DIPHENHYDRAMINE HCL 50 MG/ML IJ SOLN
50.0000 mg | Freq: Once | INTRAMUSCULAR | Status: AC
  Administered 2020-12-27: 50 mg via INTRAVENOUS

## 2020-12-27 MED ORDER — SODIUM CHLORIDE 0.9 % IV SOLN
Freq: Once | INTRAVENOUS | Status: AC
  Filled 2020-12-27: qty 250

## 2020-12-27 NOTE — Progress Notes (Signed)
I spoke with Trevor Zavala and his wife Trevor Zavala today in clinic.  He is losing weight but is drinking protein shakes.  In the past few days he has increased his intake. Dr. Julien Nordmann and I encourage the increase intake. Patient pain is under control with current regimen. I offered support and encouragement.

## 2020-12-27 NOTE — Progress Notes (Signed)
[facility] [phone]   [phone]  OFFICE PROGRESS NOTE  [name], [name], MD [place] [address]  DIAGNOSIS:  1) Stage IV (T3, N3, M1a) non-small cell lung cancer, squamous cell carcinoma presented with large left hilar mass in addition to mediastinal and left supraclavicular lymphadenopathy as well as contralateral right upper lobe nodule diagnosed in August 2018.  He has disease recurrence in December 2021. PDL 1 expression: 90%. 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 First dose 06/29/2017. Status post 6 cycles. Last dose was given 08/03/2017. 2)  Immunotherapy with Ketruda 200 MG IV every 3 weeks, first dose 09/17/2017.  Status post 35 cycles.  Last dose was September 01, 2019. 3) palliative radiotherapy to the painful lesions in the left fifth rib under the care of Dr. Tammi Klippel. 4) palliative radiotherapy to the left hip under the care of Dr. Tammi Klippel.  CURRENT THERAPY: Systemic chemotherapy with carboplatin for AUC of 5, paclitaxel 175 mg/M2 and Keytruda 200 mg IV every 3 weeks with Neulasta support.  First dose November 16, 2020.  Status post 2 cycles.  INTERVAL HISTORY: Trevor Zavala 55 y.o. male returns to the clinic today for follow-up visit accompanied by his wife.  The patient continues to complain of pain in the left hip area and back.  He is currently on fentanyl patch as well as oxycodone for breakthrough pain and his pain is well controlled.  He denied having any current chest pain, shortness of breath, cough or hemoptysis.  He denied having any fever or chills.  He has no nausea, vomiting, diarrhea but has constipation improved with the current stool softener and laxative.  The patient lost more than 10 pounds in the last few weeks.  He eats good.  He is here today for  evaluation before starting cycle #3.  MEDICAL HISTORY: Past Medical History:  Diagnosis Date  . Anxiety   . COPD (chronic obstructive pulmonary disease) ([facility])   . GERD (gastroesophageal reflux disease)   . Headache   . Hypertension   . Pneumonia   . Situational depression   . Stage III squamous cell carcinoma of left lung ([facility]) 06/19/2017   Lungs & Epiglottis  . Tubular adenoma of colon 01/2015    ALLERGIES:  is allergic to ativan [lorazepam] and escitalopram oxalate.  MEDICATIONS:  Current Outpatient Medications  Medication Sig Dispense Refill  . BREO ELLIPTA 100-25 MCG/INH AEPB INHALE 1 PUFF INTO THE LUNGS DAILY. ANNUAL APPT IS OVERDUE MUST SEE PROVIDER FOR FUTURE REFILLS 60 each 0  . clonazePAM (KLONOPIN) 0.5 MG tablet TAKE 1 OR 2 TABLETS BY MOUTH TWICE A DAY AS NEEDED FOR ANXIETY (Patient taking differently: Take 0.25 mg by mouth daily.) 180 tablet 0  . docusate sodium (COLACE) 100 MG capsule Take 200 mg by mouth daily.    Marland Kitchen esomeprazole (NEXIUM) 40 MG capsule TAKE 1 CAPSULE BY MOUTH EVERY DAY IN THE MORNING (Patient taking differently: Take 40 mg by mouth daily.) 90 capsule 3  . fentaNYL (DURAGESIC) 75 MCG/HR Place 1 patch onto the skin every 3 (three) days. 5 patch 0  . folic acid (FOLVITE) 1 MG tablet Take 1 mg by mouth daily.    . Multiple Vitamin (MULTIVITAMIN WITH MINERALS) TABS tablet Take 1 tablet by mouth daily.    . Oxycodone HCl 10 MG  TABS Take 1 tablet (10 mg total) by mouth every 4 (four) hours as needed. 40 tablet 0  . prochlorperazine (COMPAZINE) 10 MG tablet Take 1 tablet (10 mg total) by mouth every 6 (six) hours as needed for nausea or vomiting. 30 tablet 5   No current facility-administered medications for this visit.    SURGICAL HISTORY:  Past Surgical History:  Procedure Laterality Date  . COLONOSCOPY    . DIRECT LARYNGOSCOPY N/A 06/25/2017   Procedure: DIRECT LARYNGOSCOPY AND BIOPSY;  Surgeon: Rozetta Nunnery, MD;  Location: [facility];  Service: ENT;  Laterality: N/A;  . EAR CYST EXCISION N/A 11/17/2013   Procedure: SEBACEOUS CYST CHEST;  Surgeon: Joyice Faster. Cornett, MD;  Location: [facility];  Service: General;  Laterality: N/A;  . IR GASTROSTOMY TUBE MOD SED  08/10/2017  . IR GASTROSTOMY TUBE REMOVAL  09/29/2017  . KNEE ARTHROSCOPY     LEFT  . LIPOMA EXCISION N/A 11/17/2013   Procedure: EXCISION LIPOMA FOREHEAD;  Surgeon: Joyice Faster. Cornett, MD;  Location: [facility];  Service: General;  Laterality: N/A;  . LUNG BIOPSY Bilateral 06/12/2017   Procedure: LEFT LUNG BIOPSY;  Surgeon: Grace Isaac, MD;  Location: [facility];  Service: Thoracic;  Laterality: Bilateral;  . MICROLARYNGOSCOPY W/VOCAL CORD INJECTION Left 07/16/2018   Procedure: suspened micro laryngoscopy with jet ventilation and prolaryn injection;  Surgeon: Melida Quitter, MD;  Location: [facility];  Service: ENT;  Laterality: Left;  . PORTACATH PLACEMENT Right 07/01/2017   Procedure: INSERTION PORT-A-CATH - RIGHT IJ - placed with Fluoro and Ultrasound;  Surgeon: Grace Isaac, MD;  Location: [facility];  Service: Thoracic;  Laterality: Right;  Marland Kitchen VIDEO BRONCHOSCOPY WITH ENDOBRONCHIAL ULTRASOUND N/A 06/12/2017   Procedure: VIDEO BRONCHOSCOPY WITH ENDOBRONCHIAL ULTRASOUND;  Surgeon: Grace Isaac, MD;  Location: [facility];  Service: Thoracic;  Laterality: N/A;    REVIEW OF SYSTEMS:  A comprehensive review of systems was negative except for: Constitutional: positive for anorexia, fatigue and weight loss Gastrointestinal: positive for constipation Musculoskeletal: positive for bone pain   PHYSICAL EXAMINATION: General appearance: alert, cooperative, fatigued and no distress Head: Normocephalic, without obvious abnormality, atraumatic Neck: no adenopathy, no JVD, supple, symmetrical, trachea midline and thyroid not enlarged, symmetric, no tenderness/mass/nodules Lymph nodes: Cervical, supraclavicular, and axillary nodes normal. Resp: clear to  auscultation bilaterally Back: symmetric, no curvature. ROM normal. No CVA tenderness. Cardio: regular rate and rhythm, S1, S2 normal, no murmur, click, rub or gallop GI: soft, non-tender; bowel sounds normal; no masses,  no organomegaly Extremities: extremities normal, atraumatic, no cyanosis or edema  ECOG PERFORMANCE STATUS: 1 - Symptomatic but completely ambulatory  Blood pressure (!) 98/54, pulse 89, temperature 98.1 F (36.7 C), temperature source Tympanic, resp. rate 13, height 5\' 10"  (1.778 m), weight 109 lb 1.6 oz (49.5 kg), SpO2 99 %.  LABORATORY DATA: Lab Results  Component Value Date   WBC 13.5 (H) 12/27/2020   HGB 9.4 (L) 12/27/2020   HCT 29.4 (L) 12/27/2020   MCV 87.0 12/27/2020   PLT 266 12/27/2020      Chemistry      Component Value Date/Time   NA 137 12/20/2020 1123   NA 141 10/29/2017 1049   K 5.1 12/20/2020 1123   K 3.3 (L) 10/29/2017 1049   CL 100 12/20/2020 1123   CO2 29 12/20/2020 1123   CO2 26 10/29/2017 1049   BUN 17 12/20/2020 1123   BUN 11.2 10/29/2017 1049   CREATININE 0.58 (  L) 12/20/2020 1123   CREATININE 0.7 10/29/2017 1049   GLU 198 08/25/2017 0000      Component Value Date/Time   CALCIUM 10.9 (H) 12/20/2020 1123   CALCIUM 9.0 10/29/2017 1049   ALKPHOS 150 (H) 12/20/2020 1123   ALKPHOS 82 10/29/2017 1049   AST 10 (L) 12/20/2020 1123   AST 10 10/29/2017 1049   ALT 19 12/20/2020 1123   ALT 8 10/29/2017 1049   BILITOT 0.3 12/20/2020 1123   BILITOT 0.22 10/29/2017 1049       RADIOGRAPHIC STUDIES: CT HIP LEFT WO CONTRAST  Result Date: 12/14/2020 CLINICAL DATA:  Metastatic lung cancer. Evaluate left hip status post radiation. EXAM: CT OF THE LEFT HIP WITHOUT CONTRAST TECHNIQUE: Multidetector CT imaging of the left hip was performed according to the standard protocol. Multiplanar CT image reconstructions were also generated. COMPARISON:  PET-CT 11/06/2020 FINDINGS: Large destructive lesion involving the left acetabulum appears relatively  stable. The anterior cortex is destroyed and there is marked lucency throughout the acetabulum. I do not see any significant healing changes or bony ingrowth. No involvement of the left femoral head or neck is identified. There is also a stable lytic destructive bony lesion involving the left iliac bone. IMPRESSION: 1. Stable large destructive lesion involving the left acetabulum. No significant healing changes or bony ingrowth. 2. Stable lytic destructive bony lesion involving the left iliac bone. Electronically Signed   By: Marijo Sanes M.D.   On: 12/14/2020 16:19    ASSESSMENT AND PLAN: This is a very pleasant 55 years old white male with recently diagnosed stage IIIB/IV non-small cell lung cancer, squamous cell carcinoma presented with large left hilar mass in addition to mediastinal and left supraclavicular lymphadenopathy as well as suspicious right upper lobe pulmonary nodule diagnosed in August 2018. 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 He underwent a course of concurrent chemoradiation to the lung as well as the epiglottic area under the care of Dr. Tammi Klippel. He is status post 6 cycle. He tolerated this course of treatment well except for the radiation induced esophagitis as well as weight loss and fatigue. The patient had partial response to the previous treatment. He completed treatment with Keytruda 200 mg IV every 3 weeks status post 35 cycles. 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 Recent imaging studies including CT of the chest as well as PET scan showed widespread metastasis involving the right retromolleolar, mediastinal and bilateral hilar lymph nodes, bilateral pulmonary nodules as well as left pleural space and retroperitoneal nodule medial to the left kidney, the gallbladder and multiple osseous  metastasis.  The MRI of the brain showed central necrotic enhancing left frontal calvarial lesion measuring 4.3 x 3.2 cm with underlying dural thickening.  There was also abnormal enhancing tissue measuring 2.8 x 2 point centimeters involving the left temporal foss/temporalis muscle with underlying dural thickening and enhancement as part of multifocal calvarial lesions with underlying dural metastasis involving the left frontal, midline parietal and left temporal regions.  There was also multifocal left cerebellar restricted diffusion concerning for acute/subacute infarcts versus evolving metastatic lesions. The patient started systemic chemotherapy with carboplatin for AUC of 5, paclitaxel 175 mg/M2 and Keytruda 200 mg IV every 3 weeks with Neulasta support status post 2 cycles. The patient continues to tolerate this treatment well with no concerning adverse effect except for  fatigue.  He also lost few more pounds since his last visit. I recommended for him to proceed with cycle #3 today as planned. I will see him back for follow-up visit in 3 weeks for evaluation with repeat CT scan of the neck, chest, abdomen pelvis for restaging of his disease. For pain management the patient will continue his current treatment with fentanyl patch and oxycodone. He was advised to call immediately if he has any other concerning symptoms in the interval.  The patient voices understanding of current disease status and treatment options and is in agreement with the current care plan.  All questions were answered. The patient knows to call the clinic with any problems, questions or concerns. We can certainly see the patient much sooner if necessary.  Disclaimer: This note was dictated with voice recognition software. Similar sounding words can inadvertently be transcribed and may not be corrected upon review.

## 2020-12-27 NOTE — Patient Instructions (Signed)
[facility] Discharge Instructions for Patients Receiving Chemotherapy  Today you received the following chemotherapy Pembrolizumab(Keytruda), Paclitaxel(Taxol), Carboplatin.  To help prevent nausea and vomiting after your treatment, we encourage you to take your nausea medication as directed. If you develop nausea and vomiting that is not controlled by your nausea medication, call the clinic.   BELOW ARE SYMPTOMS THAT SHOULD BE REPORTED IMMEDIATELY:  *FEVER GREATER THAN 100.5 F  *CHILLS WITH OR WITHOUT FEVER  NAUSEA AND VOMITING THAT IS NOT CONTROLLED WITH YOUR NAUSEA MEDICATION  *UNUSUAL SHORTNESS OF BREATH  *UNUSUAL BRUISING OR BLEEDING  TENDERNESS IN MOUTH AND THROAT WITH OR WITHOUT PRESENCE OF ULCERS  *URINARY PROBLEMS  *BOWEL PROBLEMS  UNUSUAL RASH Items with * indicate a potential emergency and should be followed up as soon as possible.  Feel free to call the clinic should you have any questions or concerns. The clinic phone number is [phone].  Please show the [facility] at check-in to the Emergency Department and triage nurse.

## 2020-12-28 ENCOUNTER — Telehealth: Payer: Self-pay

## 2020-12-28 NOTE — Telephone Encounter (Signed)
Pts wife called requesting to move pts 11:30am lab appt on 2/24 be moved to a time closed to his MRI at 4pm on the same day. Pt does not want to have to wait around quite that long. I have sent a schedule message to move the pts lab to 3pm. I advised the pts wife of this and she expressed understanding of the plan.

## 2020-12-29 ENCOUNTER — Inpatient Hospital Stay: Payer: 59

## 2020-12-29 ENCOUNTER — Other Ambulatory Visit: Payer: Self-pay

## 2020-12-29 VITALS — BP 104/61 | HR 91 | Temp 99.5°F | Resp 16 | Ht 70.0 in

## 2020-12-29 DIAGNOSIS — Z5112 Encounter for antineoplastic immunotherapy: Secondary | ICD-10-CM | POA: Diagnosis not present

## 2020-12-29 DIAGNOSIS — C3412 Malignant neoplasm of upper lobe, left bronchus or lung: Secondary | ICD-10-CM

## 2020-12-29 MED ORDER — PEGFILGRASTIM-BMEZ 6 MG/0.6ML ~~LOC~~ SOSY
6.0000 mg | PREFILLED_SYRINGE | Freq: Once | SUBCUTANEOUS | Status: AC
  Administered 2020-12-29: 6 mg via SUBCUTANEOUS

## 2020-12-31 ENCOUNTER — Encounter: Payer: Self-pay | Admitting: Internal Medicine

## 2020-12-31 ENCOUNTER — Telehealth: Payer: Self-pay | Admitting: Internal Medicine

## 2020-12-31 NOTE — Telephone Encounter (Signed)
Called pt per 2/18 sch msg - no answer. Left message for patient to call back to reschedule lab - did not want to reschedule without talking to pt. Looks like lab and ct scan are at two different locations.

## 2020-12-31 NOTE — Telephone Encounter (Signed)
Scheduled appts per 2/17 los. Pt to get updated appt calendar at next visit per appt notes.

## 2021-01-02 ENCOUNTER — Telehealth: Payer: Self-pay | Admitting: Medical Oncology

## 2021-01-02 NOTE — Telephone Encounter (Signed)
Confirmed lab appt this week.

## 2021-01-03 ENCOUNTER — Inpatient Hospital Stay: Payer: 59

## 2021-01-03 ENCOUNTER — Ambulatory Visit (HOSPITAL_COMMUNITY)
Admission: RE | Admit: 2021-01-03 | Discharge: 2021-01-03 | Disposition: A | Discharge status: Home / Self Care | Payer: 59 | Source: Ambulatory Visit | Attending: Radiation Oncology | Admitting: Radiation Oncology

## 2021-01-03 ENCOUNTER — Encounter (HOSPITAL_COMMUNITY): Payer: Self-pay

## 2021-01-03 ENCOUNTER — Ambulatory Visit (HOSPITAL_COMMUNITY): Payer: 59

## 2021-01-03 ENCOUNTER — Other Ambulatory Visit: Payer: Self-pay

## 2021-01-03 DIAGNOSIS — C7949 Secondary malignant neoplasm of other parts of nervous system: Secondary | ICD-10-CM | POA: Diagnosis present

## 2021-01-03 DIAGNOSIS — C7931 Secondary malignant neoplasm of brain: Secondary | ICD-10-CM | POA: Diagnosis present

## 2021-01-03 DIAGNOSIS — C321 Malignant neoplasm of supraglottis: Secondary | ICD-10-CM

## 2021-01-03 LAB — CMP (CANCER CENTER ONLY)
ALT: 19 U/L (ref 0–44)
AST: 13 U/L — ABNORMAL LOW (ref 15–41)
Albumin: 3.1 g/dL — ABNORMAL LOW (ref 3.5–5.0)
Alkaline Phosphatase: 151 U/L — ABNORMAL HIGH (ref 38–126)
Anion gap: 7 (ref 5–15)
BUN: 13 mg/dL (ref 6–20)
CO2: 28 mmol/L (ref 22–32)
Calcium: 10.7 mg/dL — ABNORMAL HIGH (ref 8.9–10.3)
Chloride: 99 mmol/L (ref 98–111)
Creatinine: 0.56 mg/dL — ABNORMAL LOW (ref 0.61–1.24)
GFR, Estimated: >60 mL/min (ref >60)
Glucose, Bld: 109 mg/dL — ABNORMAL HIGH (ref 70–99)
Potassium: 4.5 mmol/L (ref 3.5–5.1)
Sodium: 134 mmol/L — ABNORMAL LOW (ref 135–145)
Total Bilirubin: 0.3 mg/dL (ref 0.3–1.2)
Total Protein: 6.4 g/dL — ABNORMAL LOW (ref 6.5–8.1)

## 2021-01-03 LAB — CBC WITH DIFFERENTIAL (CANCER CENTER ONLY)
Abs Immature Granulocytes: 0.21 10*3/uL — ABNORMAL HIGH (ref 0.00–0.07)
Basophils Absolute: 0 10*3/uL (ref 0.0–0.1)
Basophils Relative: 0 %
Eosinophils Absolute: 0 10*3/uL (ref 0.0–0.5)
Eosinophils Relative: 0 %
HCT: 30 % — ABNORMAL LOW (ref 39.0–52.0)
Hemoglobin: 9.8 g/dL — ABNORMAL LOW (ref 13.0–17.0)
Immature Granulocytes: 1 %
Lymphocytes Relative: 1 %
Lymphs Abs: 0.3 10*3/uL — ABNORMAL LOW (ref 0.7–4.0)
MCH: 27.7 pg (ref 26.0–34.0)
MCHC: 32.7 g/dL (ref 30.0–36.0)
MCV: 84.7 fL (ref 80.0–100.0)
Monocytes Absolute: 1.2 10*3/uL — ABNORMAL HIGH (ref 0.1–1.0)
Monocytes Relative: 6 %
Neutro Abs: 18.8 10*3/uL — ABNORMAL HIGH (ref 1.7–7.7)
Neutrophils Relative %: 92 %
Platelet Count: 244 10*3/uL (ref 150–400)
RBC: 3.54 MIL/uL — ABNORMAL LOW (ref 4.22–5.81)
RDW: 18.6 % — ABNORMAL HIGH (ref 11.5–15.5)
WBC Count: 20.5 10*3/uL — ABNORMAL HIGH (ref 4.0–10.5)
nRBC: 0 % (ref 0.0–0.2)

## 2021-01-03 LAB — TSH: TSH: 5.453 u[IU]/mL — ABNORMAL HIGH (ref 0.320–4.118)

## 2021-01-03 MED ORDER — GADOBUTROL 1 MMOL/ML IV SOLN
5.0000 mL | Freq: Once | INTRAVENOUS | Status: AC | PRN
  Administered 2021-01-03: 5 mL via INTRAVENOUS

## 2021-01-04 ENCOUNTER — Inpatient Hospital Stay: Payer: 59

## 2021-01-07 ENCOUNTER — Other Ambulatory Visit: Payer: Self-pay

## 2021-01-07 ENCOUNTER — Encounter: Payer: Self-pay | Admitting: Internal Medicine

## 2021-01-07 ENCOUNTER — Inpatient Hospital Stay: Payer: 59

## 2021-01-07 ENCOUNTER — Other Ambulatory Visit: Payer: Self-pay | Admitting: Internal Medicine

## 2021-01-07 ENCOUNTER — Telehealth: Payer: Self-pay | Admitting: Internal Medicine

## 2021-01-07 ENCOUNTER — Inpatient Hospital Stay (HOSPITAL_BASED_OUTPATIENT_CLINIC_OR_DEPARTMENT_OTHER): Payer: 59 | Admitting: Internal Medicine

## 2021-01-07 DIAGNOSIS — C7951 Secondary malignant neoplasm of bone: Secondary | ICD-10-CM | POA: Diagnosis not present

## 2021-01-07 DIAGNOSIS — Z5112 Encounter for antineoplastic immunotherapy: Secondary | ICD-10-CM | POA: Diagnosis present

## 2021-01-07 DIAGNOSIS — Z923 Personal history of irradiation: Secondary | ICD-10-CM | POA: Diagnosis not present

## 2021-01-07 DIAGNOSIS — Z5111 Encounter for antineoplastic chemotherapy: Secondary | ICD-10-CM | POA: Diagnosis not present

## 2021-01-07 DIAGNOSIS — I639 Cerebral infarction, unspecified: Secondary | ICD-10-CM | POA: Insufficient documentation

## 2021-01-07 DIAGNOSIS — C321 Malignant neoplasm of supraglottis: Secondary | ICD-10-CM | POA: Diagnosis not present

## 2021-01-07 DIAGNOSIS — C7931 Secondary malignant neoplasm of brain: Secondary | ICD-10-CM | POA: Diagnosis not present

## 2021-01-07 DIAGNOSIS — G893 Neoplasm related pain (acute) (chronic): Secondary | ICD-10-CM | POA: Diagnosis not present

## 2021-01-07 DIAGNOSIS — Z87891 Personal history of nicotine dependence: Secondary | ICD-10-CM | POA: Diagnosis not present

## 2021-01-07 DIAGNOSIS — Z5189 Encounter for other specified aftercare: Secondary | ICD-10-CM | POA: Diagnosis not present

## 2021-01-07 DIAGNOSIS — Z7982 Long term (current) use of aspirin: Secondary | ICD-10-CM | POA: Diagnosis not present

## 2021-01-07 DIAGNOSIS — Z9221 Personal history of antineoplastic chemotherapy: Secondary | ICD-10-CM | POA: Diagnosis not present

## 2021-01-07 DIAGNOSIS — Z79891 Long term (current) use of opiate analgesic: Secondary | ICD-10-CM | POA: Diagnosis not present

## 2021-01-07 DIAGNOSIS — C3402 Malignant neoplasm of left main bronchus: Secondary | ICD-10-CM | POA: Diagnosis present

## 2021-01-07 MED ORDER — ASPIRIN EC 81 MG PO TBEC: 81.0000 mg | DELAYED_RELEASE_TABLET | Freq: Every day | ORAL | 11 refills | Status: AC

## 2021-01-07 NOTE — Telephone Encounter (Signed)
Scheduled appointments per 2/28 los. Spoke to patient who is aware of appointments dates and times. Gave patient calendar print out.

## 2021-01-07 NOTE — Progress Notes (Signed)
[facility] [place], [place] [address] [phone]   New Patient Evaluation  Date of Service: 01/07/21 Patient Name: Trevor Zavala Patient MRN: [id number] Patient DOB: 1966-06-16 Provider: Ventura Sellers, MD  Identifying Statement:  Trevor Zavala is a 55 y.o. male with stroke, who presents for initial consultation and evaluation regarding cancer associated neurologic deficits.    Referring Provider: Tyler Pita, MD [address] [name],  [place] [address]  Primary Cancer:  Oncologic History: Oncology History  Stage III squamous cell carcinoma of left lung ([place] [facility])  06/19/2017 Initial Diagnosis   Stage III squamous cell carcinoma of left lung ([facility] [facility])   11/07/2020 Cancer Staging   Staging form: Lung, AJCC 8th Edition - Clinical: Stage IVB (cT3, cN3, pM1c) - Signed by Curt Bears, MD on 11/07/2020   11/17/2020 -  Chemotherapy    Patient is on Treatment Plan: LUNG NSCLC CARBOPLATIN + PACLITAXEL + PEMBROLIZUMAB Q21D X 4 CYCLES / PEMBROLIZUMAB MAINTENANCE Q21D        History of Present Illness: The patient's records from the referring physician were obtained and reviewed and the patient interviewed to confirm this HPI.  Trevor Zavala presents to clinic today to review recent abnormal brain MRI and follow up studies.  Trevor Zavala describes an episode of sudden onset gait instability while at home last month.  This led to hospitalization and workup for CNS process.  CNS imaging demonstrated multiple foci of restricted diffusion within cerebellum, consistent with either stroke or metastases.  Further evaluation included repeat brain MRI and MRA.  At present, Trevor Zavala is at baseline without neurologic gait impairment.  Trevor Zavala relies on a walker or wheelchair because of severe cancer related left leg pain.  Taking ASA 81mg  daily.   Medications: Current Outpatient Medications on File Prior to Visit  Medication Sig Dispense Refill  .  clonazePAM (KLONOPIN) 0.5 MG tablet TAKE 1 OR 2 TABLETS BY MOUTH TWICE A DAY AS NEEDED FOR ANXIETY (Patient taking differently: Take 0.25 mg by mouth daily.) 180 tablet 0  . docusate sodium (COLACE) 100 MG capsule Take 200 mg by mouth daily.    Marland Kitchen esomeprazole (NEXIUM) 40 MG capsule TAKE 1 CAPSULE BY MOUTH EVERY DAY IN THE MORNING (Patient taking differently: Take 40 mg by mouth daily.) 90 capsule 3  . fentaNYL (DURAGESIC) 75 MCG/HR Place 1 patch onto the skin every 3 (three) days. 5 patch 0  . folic acid (FOLVITE) 1 MG tablet Take 1 mg by mouth daily.    . Multiple Vitamin (MULTIVITAMIN WITH MINERALS) TABS tablet Take 1 tablet by mouth daily.    . Oxycodone HCl 10 MG TABS Take 1 tablet (10 mg total) by mouth every 4 (four) hours as needed. 40 tablet 0  . prochlorperazine (COMPAZINE) 10 MG tablet Take 1 tablet (10 mg total) by mouth every 6 (six) hours as needed for nausea or vomiting. 30 tablet 5   No current facility-administered medications on file prior to visit.    Allergies:  Allergies  Allergen Reactions  . Ativan [Lorazepam] Other (See Comments)    'Makes me crazy'  . Escitalopram Oxalate     "Bad thoughts"   Past Medical History:  Past Medical History:  Diagnosis Date  . Anxiety   . COPD (chronic obstructive pulmonary disease) ([facility])   . GERD (gastroesophageal reflux disease)   . Headache   . Hypertension   . Pneumonia   . Situational depression   .  Stage III squamous cell carcinoma of left lung ([facility]) 06/19/2017   Lungs & Epiglottis  . Tubular adenoma of colon 01/2015   Past Surgical History:  Past Surgical History:  Procedure Laterality Date  . COLONOSCOPY    . DIRECT LARYNGOSCOPY N/A 06/25/2017   Procedure: DIRECT LARYNGOSCOPY AND BIOPSY;  Surgeon: Rozetta Nunnery, MD;  Location: [facility];  Service: ENT;  Laterality: N/A;  . EAR CYST EXCISION N/A 11/17/2013   Procedure: SEBACEOUS CYST CHEST;  Surgeon: Joyice Faster. Cornett, MD;  Location: [facility];  Service: General;  Laterality: N/A;  . IR GASTROSTOMY TUBE MOD SED  08/10/2017  . IR GASTROSTOMY TUBE REMOVAL  09/29/2017  . KNEE ARTHROSCOPY     LEFT  . LIPOMA EXCISION N/A 11/17/2013   Procedure: EXCISION LIPOMA FOREHEAD;  Surgeon: Joyice Faster. Cornett, MD;  Location: [facility];  Service: General;  Laterality: N/A;  . LUNG BIOPSY Bilateral 06/12/2017   Procedure: LEFT LUNG BIOPSY;  Surgeon: Grace Isaac, MD;  Location: [facility];  Service: Thoracic;  Laterality: Bilateral;  . MICROLARYNGOSCOPY W/VOCAL CORD INJECTION Left 07/16/2018   Procedure: suspened micro laryngoscopy with jet ventilation and prolaryn injection;  Surgeon: Melida Quitter, MD;  Location: [facility];  Service: ENT;  Laterality: Left;  . PORTACATH PLACEMENT Right 07/01/2017   Procedure: INSERTION PORT-A-CATH - RIGHT IJ - placed with Fluoro and Ultrasound;  Surgeon: Grace Isaac, MD;  Location: [facility];  Service: Thoracic;  Laterality: Right;  Marland Kitchen VIDEO BRONCHOSCOPY WITH ENDOBRONCHIAL ULTRASOUND N/A 06/12/2017   Procedure: VIDEO BRONCHOSCOPY WITH ENDOBRONCHIAL ULTRASOUND;  Surgeon: Grace Isaac, MD;  Location: [facility] OR;  Service: Thoracic;  Laterality: N/A;   Social History:  Social History   Socioeconomic History  . Marital status: Married    Spouse name: Not on file  . Number of children: Not on file  . Years of education: Not on file  . Highest education level: Not on file  Occupational History  . Not on file  Tobacco Use  . Smoking status: Former Smoker    Packs/day: 0.25    Years: 35.00    Pack years: 8.75    Quit date: 08/10/2017    Years since quitting: 3.4  . Smokeless tobacco: Never Used  . Tobacco comment: down to 4 cig/day01/08/2018   Vaping Use  . Vaping Use: Former  Substance and Sexual Activity  . Alcohol use: Yes    Alcohol/week: 0.0 standard drinks    Comment: SOCIAL  . Drug use: No  . Sexual activity: Yes  Other Topics Concern  . Not on file  Social History Narrative    Daily Caffeine Use:  3   Social Determinants of Health   Financial Resource Strain: Not on file  Food Insecurity: Not on file  Transportation Needs: Not on file  Physical Activity: Not on file  Stress: Not on file  Social Connections: Not on file  Intimate Partner Violence: Not on file   Family History:  Family History  Problem Relation Age of Onset  . Breast cancer Mother   . Hypertension Mother   . Colon cancer Mother   . Skin cancer Father   . Hypertension Father   . Colon polyps Father   . Cancer Father 71       b cell lymphoma  . Colon cancer Father   . Rectal cancer Neg Hx     Review of Systems: Constitutional: Doesn't report fevers, chills or abnormal weight loss  Eyes: Doesn't report blurriness of vision Ears, nose, mouth, throat, and face: Doesn't report sore throat Respiratory: Doesn't report cough, dyspnea or wheezes Cardiovascular: Doesn't report palpitation, chest discomfort  Gastrointestinal:  Doesn't report nausea, constipation, diarrhea GU: Doesn't report incontinence Skin: Doesn't report skin rashes Neurological: Per HPI Musculoskeletal: Doesn't report joint pain Behavioral/Psych: Doesn't report anxiety  Physical Exam: Vitals:   01/07/21 1115  BP: 97/68  Pulse: 91  Resp: 18  Temp: (!) 97.5 F (36.4 C)  SpO2: 97%   KPS: 80. General: Thin appearing Head: Normal EENT: No conjunctival injection or scleral icterus.  Lungs: Resp effort normal Cardiac: Regular rate Abdomen: Non-distended abdomen Skin: No rashes cyanosis or petechiae. Extremities: No clubbing or edema  Neurologic Exam: Mental Status: Awake, alert, attentive to examiner. Oriented to self and environment. Language is fluent with intact comprehension.  Cranial Nerves: Visual acuity is grossly normal. Visual fields are full. Extra-ocular movements intact. No ptosis. Face is symmetric Motor: Tone and bulk are normal. Power is full in both arms and legs, pain limited. Reflexes are  symmetric, no pathologic reflexes present.  Sensory: Intact to light touch Gait: Deferred   Labs: I have reviewed the data as listed    Component Value Date/Time   NA 134 (L) 01/03/2021 1418   NA 141 10/29/2017 1049   K 4.5 01/03/2021 1418   K 3.3 (L) 10/29/2017 1049   CL 99 01/03/2021 1418   CO2 28 01/03/2021 1418   CO2 26 10/29/2017 1049   GLUCOSE 109 (H) 01/03/2021 1418   GLUCOSE 146 (H) 10/29/2017 1049   BUN 13 01/03/2021 1418   BUN 11.2 10/29/2017 1049   CREATININE 0.56 (L) 01/03/2021 1418   CREATININE 0.7 10/29/2017 1049   CALCIUM 10.7 (H) 01/03/2021 1418   CALCIUM 9.0 10/29/2017 1049   PROT 6.4 (L) 01/03/2021 1418   PROT 5.9 (L) 10/29/2017 1049   ALBUMIN 3.1 (L) 01/03/2021 1418   ALBUMIN 3.5 10/29/2017 1049   AST 13 (L) 01/03/2021 1418   AST 10 10/29/2017 1049   ALT 19 01/03/2021 1418   ALT 8 10/29/2017 1049   ALKPHOS 151 (H) 01/03/2021 1418   ALKPHOS 82 10/29/2017 1049   BILITOT 0.3 01/03/2021 1418   BILITOT 0.22 10/29/2017 1049   GFRNONAA >60 01/03/2021 1418   GFRAA >60 07/13/2020 1031   Lab Results  Component Value Date   WBC 20.5 (H) 01/03/2021   NEUTROABS 18.8 (H) 01/03/2021   HGB 9.8 (L) 01/03/2021   HCT 30.0 (L) 01/03/2021   MCV 84.7 01/03/2021   PLT 244 01/03/2021    Imaging:  MR ANGIO NECK W WO CONTRAST  Result Date: 01/04/2021 CLINICAL DATA:  Non-small cell lung cancer. Squamous cell cancer of the epiglottis. Cerebellar metastases versus strokes on prior MRI. EXAM: MRI HEAD WITHOUT AND WITH CONTRAST MRA HEAD WITHOUT AND WITH CONTRAST MRA NECK WITHOUT AND WITH CONTRAST TECHNIQUE: Multiplanar, multiecho pulse sequences of the brain and surrounding structures were obtained without and with intravenous contrast. Angiographic images of the Circle of Willis were obtained using MRA technique without and with intravenous contrast. Angiographic images of the neck were obtained using MRA technique without and with intravenous contrast. Carotid stenosis  measurements (when applicable) are obtained utilizing NASCET criteria, using the distal internal carotid diameter as the denominator. CONTRAST:  44mL GADAVIST GADOBUTROL 1 MMOL/ML IV SOLN COMPARISON:  MRI 11/06/2020. FINDINGS: MRI HEAD FINDINGS Brain/skull: Previously seen cerebellar restricted diffusion has resolved with nonenhancing encephalomalacia in these regions now, compatible with prior infarcts. No  acute infarct. The left frontal calvarial lesion has increased central necrotic component with decreased surrounding enhancement, measuring approximately 3.5 x 1.8 cm. The left temporal fossa/temporalis muscle lesion and dural involvement is less conspicuous than on the prior and difficult to discretely measure. Enhancing midline parietal calvarial lesion appears slightly decreased in bulk, measuring approximately 3.7 x 1.4 cm (series 1600, image 27), previously 3.9 x 1.5 cm when remeasured. Enhancing left parietal lesion of the lateral left parietal calvarium may be slightly decreased in thickness, measuring approximately 5 mm today (series 1600, image 195) and 8 mm on prior when remeasured. No evidence of abnormal intraparenchymal enhancement or intraparenchymal mass lesion. No hydrocephalus. No acute hemorrhage. No extra-axial fluid collection. No midline shift. Basal cisterns are patent. Partially empty sella. Skull and upper cervical spine: Skull lesions as detailed above. Additionally, there is a partially imaged T1 hypointense lesion involving the superior C4 vertebral body. Sinuses/Orbits: Mild paranasal sinus mucosal thickening without air-fluid levels. Unremarkable orbits. Other: No mastoid effusions. MRA HEAD FINDINGS Anterior circulation: No large vessel occlusion, proximal hemodynamically significant stenosis, or evidence of aneurysm. Posterior circulation:No large vessel occlusion, proximal hemodynamically significant stenosis, or evidence of aneurysm. Bilateral posterior communicating arteries are  patent. MRA NECK FINDINGS Common carotid origins are not imaged. There is narrowing of the left carotid bifurcation with approximately 60% ICA origin stenosis (series 11101, image 80). On the right, there is mild carotid bifurcation atherosclerosis without greater than 50% narrowing. Left dominant vertebral artery. Bilateral vertebral arteries are patent without evidence of hemodynamically significant stenosis. Bilateral PICAs are patent. IMPRESSION: MRI head: 1. Previously seen cerebellar restricted diffusion has resolved with nonenhancing encephalomalacia in these regions now, compatible with prior infarcts. 2. Decreased conspicuity/bulk of enhancement of the calvarial lesions with increased central necrosis of the left frontal lesion and increased diffusion restriction of the lesions. Overall, findings may reflect treatment response but warrants continued attention on follow-up. 3. T1 hypointense lesion involving the superior C4 vertebral body, incompletely characterized on this study but concerning for a metastasis. MRA: 1. Approximately 60% stenosis of the left ICA origin. 2. Otherwise, no emergent large vessel occlusion or proximal hemodynamically significant stenosis. Electronically Signed   By: Margaretha Sheffield MD   On: 01/04/2021 12:09   MR Brain W Wo Contrast  Result Date: 01/04/2021 CLINICAL DATA:  Non-small cell lung cancer. Squamous cell cancer of the epiglottis. Cerebellar metastases versus strokes on prior MRI. EXAM: MRI HEAD WITHOUT AND WITH CONTRAST MRA HEAD WITHOUT AND WITH CONTRAST MRA NECK WITHOUT AND WITH CONTRAST TECHNIQUE: Multiplanar, multiecho pulse sequences of the brain and surrounding structures were obtained without and with intravenous contrast. Angiographic images of the Circle of Willis were obtained using MRA technique without and with intravenous contrast. Angiographic images of the neck were obtained using MRA technique without and with intravenous contrast. Carotid stenosis  measurements (when applicable) are obtained utilizing NASCET criteria, using the distal internal carotid diameter as the denominator. CONTRAST:  26mL GADAVIST GADOBUTROL 1 MMOL/ML IV SOLN COMPARISON:  MRI 11/06/2020. FINDINGS: MRI HEAD FINDINGS Brain/skull: Previously seen cerebellar restricted diffusion has resolved with nonenhancing encephalomalacia in these regions now, compatible with prior infarcts. No acute infarct. The left frontal calvarial lesion has increased central necrotic component with decreased surrounding enhancement, measuring approximately 3.5 x 1.8 cm. The left temporal fossa/temporalis muscle lesion and dural involvement is less conspicuous than on the prior and difficult to discretely measure. Enhancing midline parietal calvarial lesion appears slightly decreased in bulk, measuring approximately 3.7 x 1.4 cm (series  1600, image 27), previously 3.9 x 1.5 cm when remeasured. Enhancing left parietal lesion of the lateral left parietal calvarium may be slightly decreased in thickness, measuring approximately 5 mm today (series 1600, image 195) and 8 mm on prior when remeasured. No evidence of abnormal intraparenchymal enhancement or intraparenchymal mass lesion. No hydrocephalus. No acute hemorrhage. No extra-axial fluid collection. No midline shift. Basal cisterns are patent. Partially empty sella. Skull and upper cervical spine: Skull lesions as detailed above. Additionally, there is a partially imaged T1 hypointense lesion involving the superior C4 vertebral body. Sinuses/Orbits: Mild paranasal sinus mucosal thickening without air-fluid levels. Unremarkable orbits. Other: No mastoid effusions. MRA HEAD FINDINGS Anterior circulation: No large vessel occlusion, proximal hemodynamically significant stenosis, or evidence of aneurysm. Posterior circulation:No large vessel occlusion, proximal hemodynamically significant stenosis, or evidence of aneurysm. Bilateral posterior communicating arteries are  patent. MRA NECK FINDINGS Common carotid origins are not imaged. There is narrowing of the left carotid bifurcation with approximately 60% ICA origin stenosis (series 11101, image 80). On the right, there is mild carotid bifurcation atherosclerosis without greater than 50% narrowing. Left dominant vertebral artery. Bilateral vertebral arteries are patent without evidence of hemodynamically significant stenosis. Bilateral PICAs are patent. IMPRESSION: MRI head: 1. Previously seen cerebellar restricted diffusion has resolved with nonenhancing encephalomalacia in these regions now, compatible with prior infarcts. 2. Decreased conspicuity/bulk of enhancement of the calvarial lesions with increased central necrosis of the left frontal lesion and increased diffusion restriction of the lesions. Overall, findings may reflect treatment response but warrants continued attention on follow-up. 3. T1 hypointense lesion involving the superior C4 vertebral body, incompletely characterized on this study but concerning for a metastasis. MRA: 1. Approximately 60% stenosis of the left ICA origin. 2. Otherwise, no emergent large vessel occlusion or proximal hemodynamically significant stenosis. Electronically Signed   By: Margaretha Sheffield MD   On: 01/04/2021 12:09   CT HIP LEFT WO CONTRAST  Result Date: 12/14/2020 CLINICAL DATA:  Metastatic lung cancer. Evaluate left hip status post radiation. EXAM: CT OF THE LEFT HIP WITHOUT CONTRAST TECHNIQUE: Multidetector CT imaging of the left hip was performed according to the standard protocol. Multiplanar CT image reconstructions were also generated. COMPARISON:  PET-CT 11/06/2020 FINDINGS: Large destructive lesion involving the left acetabulum appears relatively stable. The anterior cortex is destroyed and there is marked lucency throughout the acetabulum. I do not see any significant healing changes or bony ingrowth. No involvement of the left femoral head or neck is identified. There is  also a stable lytic destructive bony lesion involving the left iliac bone. IMPRESSION: 1. Stable large destructive lesion involving the left acetabulum. No significant healing changes or bony ingrowth. 2. Stable lytic destructive bony lesion involving the left iliac bone. Electronically Signed   By: Marijo Sanes M.D.   On: 12/14/2020 16:19   MR ANGIO HEAD WO W CONTRAST  Result Date: 01/04/2021 CLINICAL DATA:  Non-small cell lung cancer. Squamous cell cancer of the epiglottis. Cerebellar metastases versus strokes on prior MRI. EXAM: MRI HEAD WITHOUT AND WITH CONTRAST MRA HEAD WITHOUT AND WITH CONTRAST MRA NECK WITHOUT AND WITH CONTRAST TECHNIQUE: Multiplanar, multiecho pulse sequences of the brain and surrounding structures were obtained without and with intravenous contrast. Angiographic images of the Circle of Willis were obtained using MRA technique without and with intravenous contrast. Angiographic images of the neck were obtained using MRA technique without and with intravenous contrast. Carotid stenosis measurements (when applicable) are obtained utilizing NASCET criteria, using the distal internal carotid diameter  as the denominator. CONTRAST:  76mL GADAVIST GADOBUTROL 1 MMOL/ML IV SOLN COMPARISON:  MRI 11/06/2020. FINDINGS: MRI HEAD FINDINGS Brain/skull: Previously seen cerebellar restricted diffusion has resolved with nonenhancing encephalomalacia in these regions now, compatible with prior infarcts. No acute infarct. The left frontal calvarial lesion has increased central necrotic component with decreased surrounding enhancement, measuring approximately 3.5 x 1.8 cm. The left temporal fossa/temporalis muscle lesion and dural involvement is less conspicuous than on the prior and difficult to discretely measure. Enhancing midline parietal calvarial lesion appears slightly decreased in bulk, measuring approximately 3.7 x 1.4 cm (series 1600, image 27), previously 3.9 x 1.5 cm when remeasured. Enhancing  left parietal lesion of the lateral left parietal calvarium may be slightly decreased in thickness, measuring approximately 5 mm today (series 1600, image 195) and 8 mm on prior when remeasured. No evidence of abnormal intraparenchymal enhancement or intraparenchymal mass lesion. No hydrocephalus. No acute hemorrhage. No extra-axial fluid collection. No midline shift. Basal cisterns are patent. Partially empty sella. Skull and upper cervical spine: Skull lesions as detailed above. Additionally, there is a partially imaged T1 hypointense lesion involving the superior C4 vertebral body. Sinuses/Orbits: Mild paranasal sinus mucosal thickening without air-fluid levels. Unremarkable orbits. Other: No mastoid effusions. MRA HEAD FINDINGS Anterior circulation: No large vessel occlusion, proximal hemodynamically significant stenosis, or evidence of aneurysm. Posterior circulation:No large vessel occlusion, proximal hemodynamically significant stenosis, or evidence of aneurysm. Bilateral posterior communicating arteries are patent. MRA NECK FINDINGS Common carotid origins are not imaged. There is narrowing of the left carotid bifurcation with approximately 60% ICA origin stenosis (series 11101, image 80). On the right, there is mild carotid bifurcation atherosclerosis without greater than 50% narrowing. Left dominant vertebral artery. Bilateral vertebral arteries are patent without evidence of hemodynamically significant stenosis. Bilateral PICAs are patent. IMPRESSION: MRI head: 1. Previously seen cerebellar restricted diffusion has resolved with nonenhancing encephalomalacia in these regions now, compatible with prior infarcts. 2. Decreased conspicuity/bulk of enhancement of the calvarial lesions with increased central necrosis of the left frontal lesion and increased diffusion restriction of the lesions. Overall, findings may reflect treatment response but warrants continued attention on follow-up. 3. T1 hypointense  lesion involving the superior C4 vertebral body, incompletely characterized on this study but concerning for a metastasis. MRA: 1. Approximately 60% stenosis of the left ICA origin. 2. Otherwise, no emergent large vessel occlusion or proximal hemodynamically significant stenosis. Electronically Signed   By: Margaretha Sheffield MD   On: 01/04/2021 12:09    Assessment/Plan Cryptogenic Stroke  Trevor Zavala presents today with clinical syndrome localizing to the posterior fossa; etiology is confirmed vascular rather than neoplastic based on findings from repeat MRI brain.  MRA demonstrated some stenosis at L ICA origin, but no atheromatous disease involving posterior circulation (vascular territory of his infarct burden).    We recommended further evaluation with echocardiogram, serum lipid panel and HgbA1c.   Trevor Zavala should continue ASA 81mg  daily in the interim for secondary prevention.  We counseled him today extensively regarding diet, exercise, sleep and stress.  We spent twenty additional minutes teaching regarding the natural history, biology, and historical experience in the treatment of neurologic complications of cancer.   We appreciate the opportunity to participate in the care of Trevor Zavala.  Trevor Zavala will follow up with [place] once stroke testing is completed.  All questions were answered. The patient knows to call the clinic with any problems, questions or concerns. No barriers to learning were detected.  The total time spent in  the encounter was 40 minutes and more than 50% was on counseling and review of test results   Ventura Sellers, MD Medical Director of Neuro-Oncology [facility] at [place] 01/07/21 4:37 PM

## 2021-01-08 ENCOUNTER — Other Ambulatory Visit: Payer: Self-pay

## 2021-01-08 DIAGNOSIS — C3412 Malignant neoplasm of upper lobe, left bronchus or lung: Secondary | ICD-10-CM

## 2021-01-10 ENCOUNTER — Other Ambulatory Visit: Payer: Self-pay | Admitting: Internal Medicine

## 2021-01-10 ENCOUNTER — Telehealth: Payer: Self-pay | Admitting: Medical Oncology

## 2021-01-10 ENCOUNTER — Inpatient Hospital Stay: Payer: 59 | Attending: Internal Medicine

## 2021-01-10 ENCOUNTER — Inpatient Hospital Stay: Payer: 59

## 2021-01-10 ENCOUNTER — Other Ambulatory Visit: Payer: Self-pay

## 2021-01-10 DIAGNOSIS — I10 Essential (primary) hypertension: Secondary | ICD-10-CM | POA: Diagnosis not present

## 2021-01-10 DIAGNOSIS — Z79899 Other long term (current) drug therapy: Secondary | ICD-10-CM | POA: Insufficient documentation

## 2021-01-10 DIAGNOSIS — R5382 Chronic fatigue, unspecified: Secondary | ICD-10-CM

## 2021-01-10 DIAGNOSIS — Z7951 Long term (current) use of inhaled steroids: Secondary | ICD-10-CM | POA: Insufficient documentation

## 2021-01-10 DIAGNOSIS — Z7982 Long term (current) use of aspirin: Secondary | ICD-10-CM | POA: Insufficient documentation

## 2021-01-10 DIAGNOSIS — C321 Malignant neoplasm of supraglottis: Secondary | ICD-10-CM

## 2021-01-10 DIAGNOSIS — C3412 Malignant neoplasm of upper lobe, left bronchus or lung: Secondary | ICD-10-CM

## 2021-01-10 DIAGNOSIS — Z923 Personal history of irradiation: Secondary | ICD-10-CM | POA: Insufficient documentation

## 2021-01-10 DIAGNOSIS — C7931 Secondary malignant neoplasm of brain: Secondary | ICD-10-CM | POA: Insufficient documentation

## 2021-01-10 DIAGNOSIS — C7951 Secondary malignant neoplasm of bone: Secondary | ICD-10-CM | POA: Insufficient documentation

## 2021-01-10 DIAGNOSIS — Z9221 Personal history of antineoplastic chemotherapy: Secondary | ICD-10-CM | POA: Diagnosis not present

## 2021-01-10 DIAGNOSIS — Z8521 Personal history of malignant neoplasm of larynx: Secondary | ICD-10-CM | POA: Diagnosis not present

## 2021-01-10 DIAGNOSIS — E860 Dehydration: Secondary | ICD-10-CM | POA: Insufficient documentation

## 2021-01-10 DIAGNOSIS — C3402 Malignant neoplasm of left main bronchus: Secondary | ICD-10-CM | POA: Insufficient documentation

## 2021-01-10 DIAGNOSIS — G893 Neoplasm related pain (acute) (chronic): Secondary | ICD-10-CM

## 2021-01-10 DIAGNOSIS — I639 Cerebral infarction, unspecified: Secondary | ICD-10-CM

## 2021-01-10 LAB — CBC WITH DIFFERENTIAL (CANCER CENTER ONLY)
Abs Immature Granulocytes: 0.12 10*3/uL — ABNORMAL HIGH (ref 0.00–0.07)
Basophils Absolute: 0 10*3/uL (ref 0.0–0.1)
Basophils Relative: 0 %
Eosinophils Absolute: 0 10*3/uL (ref 0.0–0.5)
Eosinophils Relative: 0 %
HCT: 31.4 % — ABNORMAL LOW (ref 39.0–52.0)
Hemoglobin: 10.2 g/dL — ABNORMAL LOW (ref 13.0–17.0)
Immature Granulocytes: 1 %
Lymphocytes Relative: 2 %
Lymphs Abs: 0.2 10*3/uL — ABNORMAL LOW (ref 0.7–4.0)
MCH: 27.7 pg (ref 26.0–34.0)
MCHC: 32.5 g/dL (ref 30.0–36.0)
MCV: 85.3 fL (ref 80.0–100.0)
Monocytes Absolute: 0.7 10*3/uL (ref 0.1–1.0)
Monocytes Relative: 5 %
Neutro Abs: 12.8 10*3/uL — ABNORMAL HIGH (ref 1.7–7.7)
Neutrophils Relative %: 92 %
Platelet Count: 265 10*3/uL (ref 150–400)
RBC: 3.68 MIL/uL — ABNORMAL LOW (ref 4.22–5.81)
RDW: 19.5 % — ABNORMAL HIGH (ref 11.5–15.5)
WBC Count: 13.9 10*3/uL — ABNORMAL HIGH (ref 4.0–10.5)
nRBC: 0 % (ref 0.0–0.2)

## 2021-01-10 LAB — CMP (CANCER CENTER ONLY)
ALT: 29 U/L (ref 0–44)
AST: 18 U/L (ref 15–41)
Albumin: 3 g/dL — ABNORMAL LOW (ref 3.5–5.0)
Alkaline Phosphatase: 191 U/L — ABNORMAL HIGH (ref 38–126)
Anion gap: 10 (ref 5–15)
BUN: 12 mg/dL (ref 6–20)
CO2: 30 mmol/L (ref 22–32)
Calcium: 11.5 mg/dL — ABNORMAL HIGH (ref 8.9–10.3)
Chloride: 95 mmol/L — ABNORMAL LOW (ref 98–111)
Creatinine: 0.59 mg/dL — ABNORMAL LOW (ref 0.61–1.24)
GFR, Estimated: >60 mL/min (ref >60)
Glucose, Bld: 118 mg/dL — ABNORMAL HIGH (ref 70–99)
Potassium: 4.7 mmol/L (ref 3.5–5.1)
Sodium: 135 mmol/L (ref 135–145)
Total Bilirubin: 0.4 mg/dL (ref 0.3–1.2)
Total Protein: 6.7 g/dL (ref 6.5–8.1)

## 2021-01-10 LAB — LIPID PANEL
Cholesterol: 135 mg/dL (ref 0–200)
HDL: 37 mg/dL — ABNORMAL LOW (ref >40)
LDL Cholesterol: 73 mg/dL (ref 0–99)
Total CHOL/HDL Ratio: 3.6 RATIO
Triglycerides: 127 mg/dL (ref <150)
VLDL: 25 mg/dL (ref 0–40)

## 2021-01-10 LAB — HEMOGLOBIN A1C
Hgb A1c MFr Bld: 6.1 % — ABNORMAL HIGH (ref 4.8–5.6)
Mean Plasma Glucose: 128.37 mg/dL

## 2021-01-10 LAB — TSH: TSH: 5.983 u[IU]/mL — ABNORMAL HIGH (ref 0.320–4.118)

## 2021-01-10 MED ORDER — OXYCODONE HCL 10 MG PO TABS: 10.0000 mg | ORAL_TABLET | ORAL | 0 refills | Status: DC | PRN

## 2021-01-10 MED ORDER — FENTANYL 75 MCG/HR TD PT72: 1.0000 | MEDICATED_PATCH | TRANSDERMAL | 0 refills | Status: DC

## 2021-01-10 MED FILL — oxyCODONE HCL 10 MG TABS: 10 | 6 days supply | Qty: 40 | Fill #0

## 2021-01-10 MED FILL — fentaNYL 75 MCG/HR PT72: 75 | 15 days supply | Qty: 5 | Fill #0

## 2021-01-10 NOTE — Telephone Encounter (Signed)
Requested refill for Fentanyl and oxycodone. He will run out sat. Message sent to [facility].

## 2021-01-15 ENCOUNTER — Ambulatory Visit (HOSPITAL_COMMUNITY)
Admission: RE | Admit: 2021-01-15 | Discharge: 2021-01-15 | Disposition: A | Discharge status: Home / Self Care | Payer: 59 | Source: Ambulatory Visit | Attending: Internal Medicine | Admitting: Internal Medicine

## 2021-01-15 ENCOUNTER — Encounter (HOSPITAL_COMMUNITY): Payer: Self-pay

## 2021-01-15 DIAGNOSIS — C3490 Malignant neoplasm of unspecified part of unspecified bronchus or lung: Secondary | ICD-10-CM | POA: Diagnosis not present

## 2021-01-15 MED ORDER — IOHEXOL 300 MG/ML  SOLN
100.0000 mL | Freq: Once | INTRAMUSCULAR | Status: AC | PRN
  Administered 2021-01-15: 80 mL via INTRAVENOUS

## 2021-01-17 ENCOUNTER — Telehealth: Payer: Self-pay | Admitting: Medical Oncology

## 2021-01-17 ENCOUNTER — Inpatient Hospital Stay: Payer: 59

## 2021-01-17 ENCOUNTER — Encounter: Payer: Self-pay | Admitting: *Deleted

## 2021-01-17 ENCOUNTER — Inpatient Hospital Stay (HOSPITAL_BASED_OUTPATIENT_CLINIC_OR_DEPARTMENT_OTHER): Payer: 59 | Admitting: Internal Medicine

## 2021-01-17 ENCOUNTER — Encounter: Payer: Self-pay | Admitting: Internal Medicine

## 2021-01-17 ENCOUNTER — Other Ambulatory Visit: Payer: Self-pay

## 2021-01-17 VITALS — BP 97/74 | HR 94 | Temp 97.4°F | Resp 12 | Ht 70.0 in | Wt 105.5 lb

## 2021-01-17 DIAGNOSIS — C3412 Malignant neoplasm of upper lobe, left bronchus or lung: Secondary | ICD-10-CM

## 2021-01-17 DIAGNOSIS — E860 Dehydration: Secondary | ICD-10-CM

## 2021-01-17 DIAGNOSIS — Z5111 Encounter for antineoplastic chemotherapy: Secondary | ICD-10-CM | POA: Diagnosis not present

## 2021-01-17 DIAGNOSIS — C321 Malignant neoplasm of supraglottis: Secondary | ICD-10-CM

## 2021-01-17 DIAGNOSIS — C3402 Malignant neoplasm of left main bronchus: Secondary | ICD-10-CM | POA: Diagnosis not present

## 2021-01-17 DIAGNOSIS — Z95828 Presence of other vascular implants and grafts: Secondary | ICD-10-CM

## 2021-01-17 LAB — CMP (CANCER CENTER ONLY)
ALT: 38 U/L (ref 0–44)
AST: 22 U/L (ref 15–41)
Albumin: 2.8 g/dL — ABNORMAL LOW (ref 3.5–5.0)
Alkaline Phosphatase: 184 U/L — ABNORMAL HIGH (ref 38–126)
Anion gap: 9 (ref 5–15)
BUN: 12 mg/dL (ref 6–20)
CO2: 29 mmol/L (ref 22–32)
Calcium: 11 mg/dL — ABNORMAL HIGH (ref 8.9–10.3)
Chloride: 92 mmol/L — ABNORMAL LOW (ref 98–111)
Creatinine: 0.5 mg/dL — ABNORMAL LOW (ref 0.61–1.24)
GFR, Estimated: >60 mL/min (ref >60)
Glucose, Bld: 115 mg/dL — ABNORMAL HIGH (ref 70–99)
Potassium: 4.1 mmol/L (ref 3.5–5.1)
Sodium: 130 mmol/L — ABNORMAL LOW (ref 135–145)
Total Bilirubin: 0.4 mg/dL (ref 0.3–1.2)
Total Protein: 6.1 g/dL — ABNORMAL LOW (ref 6.5–8.1)

## 2021-01-17 LAB — CBC WITH DIFFERENTIAL (CANCER CENTER ONLY)
Abs Immature Granulocytes: 0.15 10*3/uL — ABNORMAL HIGH (ref 0.00–0.07)
Basophils Absolute: 0 10*3/uL (ref 0.0–0.1)
Basophils Relative: 0 %
Eosinophils Absolute: 0 10*3/uL (ref 0.0–0.5)
Eosinophils Relative: 0 %
HCT: 30.4 % — ABNORMAL LOW (ref 39.0–52.0)
Hemoglobin: 10 g/dL — ABNORMAL LOW (ref 13.0–17.0)
Immature Granulocytes: 1 %
Lymphocytes Relative: 1 %
Lymphs Abs: 0.2 10*3/uL — ABNORMAL LOW (ref 0.7–4.0)
MCH: 27.9 pg (ref 26.0–34.0)
MCHC: 32.9 g/dL (ref 30.0–36.0)
MCV: 84.7 fL (ref 80.0–100.0)
Monocytes Absolute: 1 10*3/uL (ref 0.1–1.0)
Monocytes Relative: 6 %
Neutro Abs: 14.9 10*3/uL — ABNORMAL HIGH (ref 1.7–7.7)
Neutrophils Relative %: 92 %
Platelet Count: 307 10*3/uL (ref 150–400)
RBC: 3.59 MIL/uL — ABNORMAL LOW (ref 4.22–5.81)
RDW: 21 % — ABNORMAL HIGH (ref 11.5–15.5)
WBC Count: 16.2 10*3/uL — ABNORMAL HIGH (ref 4.0–10.5)
nRBC: 0 % (ref 0.0–0.2)

## 2021-01-17 MED ORDER — SODIUM CHLORIDE 0.9 % IV SOLN
INTRAVENOUS | Status: AC
  Filled 2021-01-17 (×2): qty 250

## 2021-01-17 MED ORDER — HEPARIN SOD (PORK) LOCK FLUSH 100 UNIT/ML IV SOLN
500.0000 [IU] | Freq: Once | INTRAVENOUS | Status: AC
  Administered 2021-01-17: 500 [IU] via INTRAVENOUS
  Filled 2021-01-17: qty 5

## 2021-01-17 MED ORDER — SODIUM CHLORIDE 0.9% FLUSH
10.0000 mL | Freq: Once | INTRAVENOUS | Status: AC
  Administered 2021-01-17: 10 mL
  Filled 2021-01-17: qty 10

## 2021-01-17 MED ORDER — SODIUM CHLORIDE 0.9% FLUSH
10.0000 mL | Freq: Once | INTRAVENOUS | Status: AC
  Administered 2021-01-17: 10 mL via INTRAVENOUS
  Filled 2021-01-17: qty 10

## 2021-01-17 NOTE — Patient Instructions (Signed)
Hospice Hospice is a service that provides people who are terminally ill and their families with medical, spiritual, and psychological support. It aims to improve a person's quality of life by keeping the person as comfortable as possible in the final stages of life. Who makes up the hospice care team? The following people make up a hospice care team:  The person receiving care and his or her family.  A nurse and a hospice doctor. Your primary health care provider can also be included.  A Education officer, museum or Tourist information centre manager.  A religious or spiritual leader.  Specialists such as: ? A dietitian. ? A mental health counselor. ? Physical and occupational therapists. ? Bereavement coordinator.  Trained volunteers who can help with care.   What services are included in hospice care? Hospice services can vary, depending on the organization. Generally, they include:  Ways to keep you comfortable, such as: ? Providing care in your home or in a home-like setting. ? Providing relief from pain and symptoms. The staff will supply all medicines and equipment to keep you comfortable and alert enough to enjoy the company of friends and family. ? Working with your loved ones to help them meet your needs and provide much of your basic care. This helps you to enjoy their support.  Visits or care from a nurse and doctor. This may include 24-hour on-call services.  Visits from other specialists who offer services, such as: ? Counseling to meet your emotional, spiritual, and social needs as well as those needs of your family. ? Massage therapy. ? Nutrition therapy. ? Physical and occupational therapy. ? Art or music therapy. ? Spiritual care to meet your needs and your family's needs. It may involve:  Helping you and your family understand the dying process.  Helping you say goodbye to your family and friends.  Performing a specific religious ceremony or ritual.  Companionship when you are  alone.  Allowing you and your family to rest. Hospice staff may do light housekeeping, prepare meals, and run errands.  Short-term inpatient care, if something cannot be managed in the home.  Bereavement support for grieving family members. When should hospice care begin? Most people who use hospice are believed to have 6 months or less to live.  Your family and health care providers can help you decide when hospice services should begin.  If you live longer than 6 months but your condition does not improve, your doctor may be able to approve you for continued hospice care.  If your condition improves, you may discontinue the program. What should I consider before selecting a program? Most hospice programs are run by nonprofit, independent organizations. Some are affiliated with hospitals, nursing homes, or home health care agencies. Hospice programs can take place in your home or at a hospice center, hospital, or skilled nursing facility. When choosing a hospice program, ask about the following:  What services are available to me and my loved ones? ? What does it cost? Is it covered by insurance? ? Is the program reviewed and licensed by the state or certified in some other way? ? How will my pain and symptoms be managed? ? Will you provide emotional and spiritual support?  If I choose a hospice center or nursing home, where is the hospice center located? Is it convenient for family and friends? ? If I choose a hospice center or nursing home, will my family and friends be able to visit any time? ? If my circumstances change, can  the services be provided in a different setting, such as in my home or in the hospital?  Who makes up the hospice care team? How are they trained or screened? ? How involved can my loved ones be? ? Whom can my family call with questions? ? How is my health care provider involved? Where can I learn more about hospice? You can learn about existing hospice  programs in your area from your health care providers. The websites of the following organizations have helpful information:  [facility]: http://www.brown-buchanan.com/  National Association for [place]: http://massey-hart.com/  Hospice Foundation of [place]: www.hospicefoundation.org  American Cancer Society: www.cancer.org  eHospice: ehospice.com These agencies also can provide information:  A local agency on aging.  Your local [employer] chapter.  Your state's department of health or social services. Summary  Hospice is a service that provides people who are terminally ill and their families with medical, spiritual, and psychological support.  Hospice aims to improve your quality of life by keeping you as comfortable as possible in the final stages of life.  Hospice teams often include a doctor, nurse, social worker, counselor, religious or spiritual leader, dietitian, therapists, and volunteers.  Hospice care generally includes pain management, visits from doctors and nurses, physical and occupational therapy, nutrition therapy, spiritual and emotional counseling, caregiver support, and bereavement support for grieving family members.  Hospice programs can take place in your home or at a hospice center, hospital, or skilled nursing facility. This information is not intended to replace advice given to you by your health care provider. Make sure you discuss any questions you have with your health care provider. Document Revised: 07/31/2020 Document Reviewed: 07/31/2020 Elsevier Patient Education  2021 [employer].

## 2021-01-17 NOTE — Telephone Encounter (Signed)
Thoracentesis appt- Monday 03/14 at 1pm at [place] test tomorrow at [place] at  [facility].  Wife confirmed appts.  Hospice referral made to [place].

## 2021-01-17 NOTE — Progress Notes (Signed)
[place] [phone]   [phone]  OFFICE PROGRESS NOTE  [name], [name], MD [place] [address]  DIAGNOSIS:  1) Stage IV (T3, N3, M1a) non-small cell lung cancer, squamous cell carcinoma presented with large left hilar mass in addition to mediastinal and left supraclavicular lymphadenopathy as well as contralateral right upper lobe nodule diagnosed in August 2018.  He has disease recurrence in December 2021. PDL 1 expression: 90%. 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 First dose 06/29/2017. Status post 6 cycles. Last dose was given 08/03/2017. 2)  Immunotherapy with Ketruda 200 MG IV every 3 weeks, first dose 09/17/2017.  Status post 35 cycles.  Last dose was September 01, 2019. 3) palliative radiotherapy to the painful lesions in the left fifth rib under the care of Dr. Tammi Klippel. 4) palliative radiotherapy to the left hip under the care of Dr. Tammi Klippel. 5) Systemic chemotherapy with carboplatin for AUC of 5, paclitaxel 175 mg/M2 and Keytruda 200 mg IV every 3 weeks with Neulasta support.  First dose November 16, 2020.  Status post 3 cycles.  Discontinued secondary to disease progression.  CURRENT THERAPY: Palliative and hospice care.  INTERVAL HISTORY: Trevor Zavala 55 y.o. male returns to the clinic today for follow-up visit accompanied by his wife.  The patient is complaining of increasing fatigue and weakness as well as persistent cough and shortness of breath with minimal exertion.  He has difficulty sleeping at nighttime.  He denied having any chest pain or hemoptysis.  He lost a lot of weight.  He has occasional nausea with no vomiting, diarrhea or constipation.  He denied having any headache or visual changes.  He tolerated the previous course of systemic chemotherapy fairly well  except for the fatigue.  He had repeat CT scan of the neck, chest, abdomen pelvis performed recently and he is here for evaluation and discussion of his scan results and treatment options.   MEDICAL HISTORY: Past Medical History:  Diagnosis Date  . Anxiety   . COPD (chronic obstructive pulmonary disease) ([facility])   . GERD (gastroesophageal reflux disease)   . Headache   . Hypertension   . Pneumonia   . Situational depression   . Stage III squamous cell carcinoma of left lung ([facility]) 06/19/2017   Lungs & Epiglottis  . Tubular adenoma of colon 01/2015    ALLERGIES:  is allergic to ativan [lorazepam] and escitalopram oxalate.  MEDICATIONS:  Current Outpatient Medications  Medication Sig Dispense Refill  . aspirin EC 81 MG tablet Take 1 tablet (81 mg total) by mouth daily. Swallow whole. 30 tablet 11  . BREO ELLIPTA 100-25 MCG/INH AEPB INHALE 1 PUFF INTO THE LUNGS DAILY. ANNUAL APPT IS OVERDUE MUST SEE PROVIDER FOR FUTURE REFILLS 60 each 5  . clonazePAM (KLONOPIN) 0.5 MG tablet TAKE 1 OR 2 TABLETS BY MOUTH TWICE A DAY AS NEEDED FOR ANXIETY (Patient taking differently: Take 0.25 mg by mouth daily.) 180 tablet 0  . docusate sodium (COLACE) 100 MG capsule Take 200 mg by mouth daily.    Marland Kitchen esomeprazole (NEXIUM) 40 MG capsule TAKE 1 CAPSULE BY MOUTH EVERY DAY IN THE MORNING (Patient taking differently: Take 40 mg by mouth daily.) 90 capsule 3  . fentaNYL (DURAGESIC) 75 MCG/HR Place 1 patch onto the skin every 3 (three) days. 5 patch 0  .  folic acid (FOLVITE) 1 MG tablet Take 1 mg by mouth daily.    . Multiple Vitamin (MULTIVITAMIN WITH MINERALS) TABS tablet Take 1 tablet by mouth daily.    . Oxycodone HCl 10 MG TABS Take 1 tablet (10 mg total) by mouth every 4 (four) hours as needed. 40 tablet 0  . prochlorperazine (COMPAZINE) 10 MG tablet Take 1 tablet (10 mg total) by mouth every 6 (six) hours as needed for nausea or vomiting. (Patient not taking: Reported on 01/17/2021) 30 tablet 5   No current  facility-administered medications for this visit.    SURGICAL HISTORY:  Past Surgical History:  Procedure Laterality Date  . COLONOSCOPY    . DIRECT LARYNGOSCOPY N/A 06/25/2017   Procedure: DIRECT LARYNGOSCOPY AND BIOPSY;  Surgeon: Rozetta Nunnery, MD;  Location: [facility];  Service: ENT;  Laterality: N/A;  . EAR CYST EXCISION N/A 11/17/2013   Procedure: SEBACEOUS CYST CHEST;  Surgeon: Joyice Faster. Cornett, MD;  Location: [facility];  Service: General;  Laterality: N/A;  . IR GASTROSTOMY TUBE MOD SED  08/10/2017  . IR GASTROSTOMY TUBE REMOVAL  09/29/2017  . KNEE ARTHROSCOPY     LEFT  . LIPOMA EXCISION N/A 11/17/2013   Procedure: EXCISION LIPOMA FOREHEAD;  Surgeon: Joyice Faster. Cornett, MD;  Location: [facility];  Service: General;  Laterality: N/A;  . LUNG BIOPSY Bilateral 06/12/2017   Procedure: LEFT LUNG BIOPSY;  Surgeon: Grace Isaac, MD;  Location: [facility];  Service: Thoracic;  Laterality: Bilateral;  . MICROLARYNGOSCOPY W/VOCAL CORD INJECTION Left 07/16/2018   Procedure: suspened micro laryngoscopy with jet ventilation and prolaryn injection;  Surgeon: Melida Quitter, MD;  Location: [facility];  Service: ENT;  Laterality: Left;  . PORTACATH PLACEMENT Right 07/01/2017   Procedure: INSERTION PORT-A-CATH - RIGHT IJ - placed with Fluoro and Ultrasound;  Surgeon: Grace Isaac, MD;  Location: [facility];  Service: Thoracic;  Laterality: Right;  Marland Kitchen VIDEO BRONCHOSCOPY WITH ENDOBRONCHIAL ULTRASOUND N/A 06/12/2017   Procedure: VIDEO BRONCHOSCOPY WITH ENDOBRONCHIAL ULTRASOUND;  Surgeon: Grace Isaac, MD;  Location: [facility];  Service: Thoracic;  Laterality: N/A;    REVIEW OF SYSTEMS:  Constitutional: positive for anorexia, fatigue and weight loss Eyes: negative Ears, nose, mouth, throat, and face: negative Respiratory: positive for cough and dyspnea on exertion Cardiovascular: negative Gastrointestinal: positive for  nausea Genitourinary:negative Integument/breast: negative Hematologic/lymphatic: negative Musculoskeletal:positive for bone pain and muscle weakness Neurological: negative Behavioral/Psych: negative Endocrine: negative Allergic/Immunologic: negative   PHYSICAL EXAMINATION: General appearance: alert, cooperative, fatigued and no distress Head: Normocephalic, without obvious abnormality, atraumatic Neck: no adenopathy, no JVD, supple, symmetrical, trachea midline and thyroid not enlarged, symmetric, no tenderness/mass/nodules Lymph nodes: Cervical, supraclavicular, and axillary nodes normal. Resp: clear to auscultation bilaterally Back: symmetric, no curvature. ROM normal. No CVA tenderness. Cardio: regular rate and rhythm, S1, S2 normal, no murmur, click, rub or gallop GI: soft, non-tender; bowel sounds normal; no masses,  no organomegaly Extremities: extremities normal, atraumatic, no cyanosis or edema Neurologic: Alert and oriented X 3, normal strength and tone. Normal symmetric reflexes. Normal coordination and gait  ECOG PERFORMANCE STATUS: 2 - Symptomatic, <50% confined to bed  Blood pressure 97/74, pulse 94, temperature (!) 97.4 F (36.3 C), temperature source Tympanic, resp. rate 12, height 5\' 10"  (1.778 m), weight 105 lb 8 oz (47.9 kg), SpO2 97 %.  LABORATORY DATA: Lab Results  Component Value Date   WBC 16.2 (H) 01/17/2021   HGB 10.0 (L) 01/17/2021   HCT  30.4 (L) 01/17/2021   MCV 84.7 01/17/2021   PLT 307 01/17/2021      Chemistry      Component Value Date/Time   NA 130 (L) 01/17/2021 0945   NA 141 10/29/2017 1049   K 4.1 01/17/2021 0945   K 3.3 (L) 10/29/2017 1049   CL 92 (L) 01/17/2021 0945   CO2 29 01/17/2021 0945   CO2 26 10/29/2017 1049   BUN 12 01/17/2021 0945   BUN 11.2 10/29/2017 1049   CREATININE 0.50 (L) 01/17/2021 0945   CREATININE 0.7 10/29/2017 1049   GLU 198 08/25/2017 0000      Component Value Date/Time   CALCIUM 11.0 (H) 01/17/2021 0945    CALCIUM 9.0 10/29/2017 1049   ALKPHOS 184 (H) 01/17/2021 0945   ALKPHOS 82 10/29/2017 1049   AST 22 01/17/2021 0945   AST 10 10/29/2017 1049   ALT 38 01/17/2021 0945   ALT 8 10/29/2017 1049   BILITOT 0.4 01/17/2021 0945   BILITOT 0.22 10/29/2017 1049       RADIOGRAPHIC STUDIES: CT Soft Tissue Neck W Contrast  Result Date: 01/15/2021 CLINICAL DATA:  Lung cancer. Epiglottis cancer. Follow-up response to treatment. EXAM: CT NECK WITH CONTRAST TECHNIQUE: Multidetector CT imaging of the neck was performed using the standard protocol following the bolus administration of intravenous contrast. CONTRAST:  39mL OMNIPAQUE IOHEXOL 300 MG/ML  SOLN COMPARISON:  CT neck 07/13/2020 FINDINGS: Pharynx and larynx: Normal. Negative for mass. Improvement in edema in the larynx due to radiation. Atrophy left vocal cord may be due to paresis. Mild enlargement of the laryngeal ventricle on the left. Perform sinus normal in size. Epiglottis normal in size. Salivary glands: Progressive atrophy of the submandibular gland bilaterally which may be due to radiation. Normal parotid bilaterally. Thyroid: Negative Lymph nodes: Lymph node mass posterior to the angle the mandible on the right measures up to 20 mm in diameter. This shows irregularity and hyperenhancement and is compatible with metastatic adenopathy. This has progressed since the prior PET CT of 11/06/2020. No other worrisome lymph nodes in the neck. Vascular: Normal vascular enhancement. Right jugular Port-A-Cath. Atherosclerotic calcification in the carotid bifurcation bilaterally with moderate stenosis left internal carotid artery. Limited intracranial: Negative Visualized orbits: Negative Mastoids and visualized paranasal sinuses: Paranasal sinuses clear. Left mastoidectomy clear. Skeleton: No acute skeletal abnormality. Upper chest: Chest CT reported separately Other: None IMPRESSION: Enlarging lymph node mass posterior to the angle of the mandible on the right  compatible with metastatic disease. This now measures 20 mm in diameter. No other adenopathy Atrophic changes in the left vocal cord.  Normal epiglottis. Radiation changes in the neck with progressive atrophy of the submandibular gland bilaterally. Moderate stenosis left internal carotid artery due to atherosclerotic disease. Electronically Signed   By: Franchot Gallo M.D.   On: 01/15/2021 16:40   CT Chest W Contrast  Result Date: 01/16/2021 CLINICAL DATA:  Primary Cancer Type: Lung Imaging Indication: Assess response to therapy Interval therapy since last imaging? Yes Initial Cancer Diagnosis Date: 06/12/2017; Established by: Biopsy-proven Detailed Pathology: Stage IV non-small cell lung cancer, squamous cell carcinoma. Primary Tumor location: Left hilum.  Right upper lobe nodule. Recurrence? Yes; Date(s) of recurrence: 10/19/2020; Established by: Imaging only Widespread metastasis involving the lymph nodes, bilateral pulmonary nodules as well as left pleural space and retroperitoneal nodule medial to the left kidney, the gallbladder and multiple osseous metastasis. Surgeries: No. Chemotherapy: Yes; Ongoing? Yes; Most recent administration: 12/27/2020 Immunotherapy?  Yes; Type: Ketruda; Ongoing? Yes Radiation therapy?  Yes Date Range: 10/29/2020 - 11/13/2020; Target: Calvarial metastases. Metastases in L5 and the left hip. Date Range: 09/04/2020 - 09/14/2020; Target: Left 5th rib Date Range: 06/30/2017 - 08/18/2017; Target: Lung and larynx Other Cancers: Squamous cell carcinoma of the epiglottis diagnosed 06/2017. EXAM: CT CHEST, ABDOMEN, AND PELVIS WITH CONTRAST TECHNIQUE: Multidetector CT imaging of the chest, abdomen and pelvis was performed following the standard protocol during bolus administration of intravenous contrast. CONTRAST:  40mL OMNIPAQUE IOHEXOL 300 MG/ML  SOLN COMPARISON:  Most recent CT chest, abdomen and pelvis 10/19/2020. 11/06/2020 PET-CT. FINDINGS: CT CHEST FINDINGS Cardiovascular: Right  chest port catheter. Aortic atherosclerosis. Normal heart size. No pericardial effusion. Mediastinum/Nodes: Unchanged post treatment appearance of soft tissue about the left hilum, subcarinal station, and right hilum, previously FDG avid. Unchanged enlarged pretracheal lymph node measuring 1.8 x 0.9 cm (series 2, image 31). Thyroid gland, trachea, and esophagus demonstrate no significant findings. Lungs/Pleura: Large left pleural effusion, substantially increased compared to prior examination with extensive atelectasis and/or consolidation of the left lower lobe and lingula. There is significantly increased consolidation about the hilum of the left lung as well as the paramedian left upper lobe (series 6, image 82). Increased conspicuity of left-sided pleural nodularity, an index nodule of the dependent left pleural measuring 1.2 x 0.7 cm, previously 0.9 x 0.4 cm (e.g. Series 2, image 54). There is extensive fluid within the left mainstem bronchus and distal airways. Diffuse right-sided bronchial wall thickening. Mild centrilobular emphysema. Interlobular septal thickening at the right lung base. Minimal atelectasis or consolidation of the dependent right lung. Radiation fibrosis of the paramedian upper lobes bilaterally. Stable nodularity of the apical left upper lobe, largest nodule measuring 0.8 x 0.5 cm (series 6, image 52). Musculoskeletal: No chest wall mass. CT ABDOMEN PELVIS FINDINGS Hepatobiliary: No solid liver abnormality is seen. No gallstones, gallbladder wall thickening, or biliary dilatation. Pancreas: Unremarkable. No pancreatic ductal dilatation or surrounding inflammatory changes. Spleen: Normal in size without significant abnormality. Adrenals/Urinary Tract: Unchanged nodule of the lateral limb of the left adrenal gland, not previously FDG avid (series 2, image 72). Kidneys are normal, without renal calculi, solid lesion, or hydronephrosis. Bladder is unremarkable. Stomach/Bowel: Stomach is within  normal limits. Appendix appears normal. Unchanged partially circumferential soft tissue thickening of the cecum at the level of the terminal ileum (series 4, image 78). Vascular/Lymphatic: Aortic atherosclerosis. No enlarged abdominal or pelvic lymph nodes. Reproductive: No mass or other abnormality. Other: No abdominal wall hernia or abnormality. No abdominopelvic ascites. Unchanged low-attenuation metastatic soft tissue nodule adjacent to the medial left kidney measuring 2.0 x 1.3 cm (series 2, image 77). Musculoskeletal: Extensive lytic osseous metastatic disease throughout the included skeleton is unchanged, including of the right scapular body (series 2, image 11), left acromion (series 2, image 9), multiple bilateral ribs (series 2, image 19), the L5 vertebral body (series 2, image 100), and multiple bilateral pelvic lesions (series 2, image 110). IMPRESSION: 1. Large left pleural effusion, substantially increased compared to prior examination with extensive atelectasis and/or consolidation of the left lower lobe and lingula. There is extensive fluid within the left mainstem bronchus and distal airways. 2. Increased conspicuity of left-sided pleural nodularity,, consistent with worsened pleural metastatic disease. 3. Unchanged post treatment appearance of soft tissue about the left hilum, subcarinal station, and right hilum, as well as unchanged enlarged pretracheal lymphadenopathy, these findings all reflecting previously FDG avid metastatic disease. 4. Stable nodularity of the apical left upper lobe, again reflecting previously FDG avid metastatic disease. 5. Significant  interval increase in perihilar consolidation of the left lung as well as the paramedian left upper lobe. This is highly suspicious for locally worsened malignancy, although difficult to distinguish from atelectasis related to increased size of pleural effusion. There is substantial FDG avid disease in this vicinity on prior PET-CT. Repeat  PET-CT may be helpful to assess for interval change in avid disease. 6. Unchanged metastatic soft tissue nodule adjacent to the medial left kidney. 7. Unchanged widespread lytic osseous metastatic disease. 8. Unchanged partially circumferential soft tissue thickening of the cecum at the level of the terminal ileum. This remains concerning for metachronous primary colon malignancy given appearance, metastatic disease to the bowel less favored. 9. Emphysema. Aortic Atherosclerosis (ICD10-I70.0) and Emphysema (ICD10-J43.9). Electronically Signed   By: Eddie Candle M.D.   On: 01/16/2021 10:42   MR ANGIO NECK W WO CONTRAST  Result Date: 01/04/2021 CLINICAL DATA:  Non-small cell lung cancer. Squamous cell cancer of the epiglottis. Cerebellar metastases versus strokes on prior MRI. EXAM: MRI HEAD WITHOUT AND WITH CONTRAST MRA HEAD WITHOUT AND WITH CONTRAST MRA NECK WITHOUT AND WITH CONTRAST TECHNIQUE: Multiplanar, multiecho pulse sequences of the brain and surrounding structures were obtained without and with intravenous contrast. Angiographic images of the Circle of Willis were obtained using MRA technique without and with intravenous contrast. Angiographic images of the neck were obtained using MRA technique without and with intravenous contrast. Carotid stenosis measurements (when applicable) are obtained utilizing NASCET criteria, using the distal internal carotid diameter as the denominator. CONTRAST:  28mL GADAVIST GADOBUTROL 1 MMOL/ML IV SOLN COMPARISON:  MRI 11/06/2020. FINDINGS: MRI HEAD FINDINGS Brain/skull: Previously seen cerebellar restricted diffusion has resolved with nonenhancing encephalomalacia in these regions now, compatible with prior infarcts. No acute infarct. The left frontal calvarial lesion has increased central necrotic component with decreased surrounding enhancement, measuring approximately 3.5 x 1.8 cm. The left temporal fossa/temporalis muscle lesion and dural involvement is less  conspicuous than on the prior and difficult to discretely measure. Enhancing midline parietal calvarial lesion appears slightly decreased in bulk, measuring approximately 3.7 x 1.4 cm (series 1600, image 27), previously 3.9 x 1.5 cm when remeasured. Enhancing left parietal lesion of the lateral left parietal calvarium may be slightly decreased in thickness, measuring approximately 5 mm today (series 1600, image 195) and 8 mm on prior when remeasured. No evidence of abnormal intraparenchymal enhancement or intraparenchymal mass lesion. No hydrocephalus. No acute hemorrhage. No extra-axial fluid collection. No midline shift. Basal cisterns are patent. Partially empty sella. Skull and upper cervical spine: Skull lesions as detailed above. Additionally, there is a partially imaged T1 hypointense lesion involving the superior C4 vertebral body. Sinuses/Orbits: Mild paranasal sinus mucosal thickening without air-fluid levels. Unremarkable orbits. Other: No mastoid effusions. MRA HEAD FINDINGS Anterior circulation: No large vessel occlusion, proximal hemodynamically significant stenosis, or evidence of aneurysm. Posterior circulation:No large vessel occlusion, proximal hemodynamically significant stenosis, or evidence of aneurysm. Bilateral posterior communicating arteries are patent. MRA NECK FINDINGS Common carotid origins are not imaged. There is narrowing of the left carotid bifurcation with approximately 60% ICA origin stenosis (series 11101, image 80). On the right, there is mild carotid bifurcation atherosclerosis without greater than 50% narrowing. Left dominant vertebral artery. Bilateral vertebral arteries are patent without evidence of hemodynamically significant stenosis. Bilateral PICAs are patent. IMPRESSION: MRI head: 1. Previously seen cerebellar restricted diffusion has resolved with nonenhancing encephalomalacia in these regions now, compatible with prior infarcts. 2. Decreased conspicuity/bulk of  enhancement of the calvarial lesions with increased central necrosis  of the left frontal lesion and increased diffusion restriction of the lesions. Overall, findings may reflect treatment response but warrants continued attention on follow-up. 3. T1 hypointense lesion involving the superior C4 vertebral body, incompletely characterized on this study but concerning for a metastasis. MRA: 1. Approximately 60% stenosis of the left ICA origin. 2. Otherwise, no emergent large vessel occlusion or proximal hemodynamically significant stenosis. Electronically Signed   By: Margaretha Sheffield MD   On: 01/04/2021 12:09   MR Brain W Wo Contrast  Result Date: 01/04/2021 CLINICAL DATA:  Non-small cell lung cancer. Squamous cell cancer of the epiglottis. Cerebellar metastases versus strokes on prior MRI. EXAM: MRI HEAD WITHOUT AND WITH CONTRAST MRA HEAD WITHOUT AND WITH CONTRAST MRA NECK WITHOUT AND WITH CONTRAST TECHNIQUE: Multiplanar, multiecho pulse sequences of the brain and surrounding structures were obtained without and with intravenous contrast. Angiographic images of the Circle of Willis were obtained using MRA technique without and with intravenous contrast. Angiographic images of the neck were obtained using MRA technique without and with intravenous contrast. Carotid stenosis measurements (when applicable) are obtained utilizing NASCET criteria, using the distal internal carotid diameter as the denominator. CONTRAST:  77mL GADAVIST GADOBUTROL 1 MMOL/ML IV SOLN COMPARISON:  MRI 11/06/2020. FINDINGS: MRI HEAD FINDINGS Brain/skull: Previously seen cerebellar restricted diffusion has resolved with nonenhancing encephalomalacia in these regions now, compatible with prior infarcts. No acute infarct. The left frontal calvarial lesion has increased central necrotic component with decreased surrounding enhancement, measuring approximately 3.5 x 1.8 cm. The left temporal fossa/temporalis muscle lesion and dural involvement is  less conspicuous than on the prior and difficult to discretely measure. Enhancing midline parietal calvarial lesion appears slightly decreased in bulk, measuring approximately 3.7 x 1.4 cm (series 1600, image 27), previously 3.9 x 1.5 cm when remeasured. Enhancing left parietal lesion of the lateral left parietal calvarium may be slightly decreased in thickness, measuring approximately 5 mm today (series 1600, image 195) and 8 mm on prior when remeasured. No evidence of abnormal intraparenchymal enhancement or intraparenchymal mass lesion. No hydrocephalus. No acute hemorrhage. No extra-axial fluid collection. No midline shift. Basal cisterns are patent. Partially empty sella. Skull and upper cervical spine: Skull lesions as detailed above. Additionally, there is a partially imaged T1 hypointense lesion involving the superior C4 vertebral body. Sinuses/Orbits: Mild paranasal sinus mucosal thickening without air-fluid levels. Unremarkable orbits. Other: No mastoid effusions. MRA HEAD FINDINGS Anterior circulation: No large vessel occlusion, proximal hemodynamically significant stenosis, or evidence of aneurysm. Posterior circulation:No large vessel occlusion, proximal hemodynamically significant stenosis, or evidence of aneurysm. Bilateral posterior communicating arteries are patent. MRA NECK FINDINGS Common carotid origins are not imaged. There is narrowing of the left carotid bifurcation with approximately 60% ICA origin stenosis (series 11101, image 80). On the right, there is mild carotid bifurcation atherosclerosis without greater than 50% narrowing. Left dominant vertebral artery. Bilateral vertebral arteries are patent without evidence of hemodynamically significant stenosis. Bilateral PICAs are patent. IMPRESSION: MRI head: 1. Previously seen cerebellar restricted diffusion has resolved with nonenhancing encephalomalacia in these regions now, compatible with prior infarcts. 2. Decreased conspicuity/bulk of  enhancement of the calvarial lesions with increased central necrosis of the left frontal lesion and increased diffusion restriction of the lesions. Overall, findings may reflect treatment response but warrants continued attention on follow-up. 3. T1 hypointense lesion involving the superior C4 vertebral body, incompletely characterized on this study but concerning for a metastasis. MRA: 1. Approximately 60% stenosis of the left ICA origin. 2. Otherwise, no emergent large vessel  occlusion or proximal hemodynamically significant stenosis. Electronically Signed   By: Margaretha Sheffield MD   On: 01/04/2021 12:09   CT Abdomen Pelvis W Contrast  Result Date: 01/16/2021 CLINICAL DATA:  Primary Cancer Type: Lung Imaging Indication: Assess response to therapy Interval therapy since last imaging? Yes Initial Cancer Diagnosis Date: 06/12/2017; Established by: Biopsy-proven Detailed Pathology: Stage IV non-small cell lung cancer, squamous cell carcinoma. Primary Tumor location: Left hilum.  Right upper lobe nodule. Recurrence? Yes; Date(s) of recurrence: 10/19/2020; Established by: Imaging only Widespread metastasis involving the lymph nodes, bilateral pulmonary nodules as well as left pleural space and retroperitoneal nodule medial to the left kidney, the gallbladder and multiple osseous metastasis. Surgeries: No. Chemotherapy: Yes; Ongoing? Yes; Most recent administration: 12/27/2020 Immunotherapy?  Yes; Type: Ketruda; Ongoing? Yes Radiation therapy? Yes Date Range: 10/29/2020 - 11/13/2020; Target: Calvarial metastases. Metastases in L5 and the left hip. Date Range: 09/04/2020 - 09/14/2020; Target: Left 5th rib Date Range: 06/30/2017 - 08/18/2017; Target: Lung and larynx Other Cancers: Squamous cell carcinoma of the epiglottis diagnosed 06/2017. EXAM: CT CHEST, ABDOMEN, AND PELVIS WITH CONTRAST TECHNIQUE: Multidetector CT imaging of the chest, abdomen and pelvis was performed following the standard protocol during bolus  administration of intravenous contrast. CONTRAST:  65mL OMNIPAQUE IOHEXOL 300 MG/ML  SOLN COMPARISON:  Most recent CT chest, abdomen and pelvis 10/19/2020. 11/06/2020 PET-CT. FINDINGS: CT CHEST FINDINGS Cardiovascular: Right chest port catheter. Aortic atherosclerosis. Normal heart size. No pericardial effusion. Mediastinum/Nodes: Unchanged post treatment appearance of soft tissue about the left hilum, subcarinal station, and right hilum, previously FDG avid. Unchanged enlarged pretracheal lymph node measuring 1.8 x 0.9 cm (series 2, image 31). Thyroid gland, trachea, and esophagus demonstrate no significant findings. Lungs/Pleura: Large left pleural effusion, substantially increased compared to prior examination with extensive atelectasis and/or consolidation of the left lower lobe and lingula. There is significantly increased consolidation about the hilum of the left lung as well as the paramedian left upper lobe (series 6, image 82). Increased conspicuity of left-sided pleural nodularity, an index nodule of the dependent left pleural measuring 1.2 x 0.7 cm, previously 0.9 x 0.4 cm (e.g. Series 2, image 54). There is extensive fluid within the left mainstem bronchus and distal airways. Diffuse right-sided bronchial wall thickening. Mild centrilobular emphysema. Interlobular septal thickening at the right lung base. Minimal atelectasis or consolidation of the dependent right lung. Radiation fibrosis of the paramedian upper lobes bilaterally. Stable nodularity of the apical left upper lobe, largest nodule measuring 0.8 x 0.5 cm (series 6, image 52). Musculoskeletal: No chest wall mass. CT ABDOMEN PELVIS FINDINGS Hepatobiliary: No solid liver abnormality is seen. No gallstones, gallbladder wall thickening, or biliary dilatation. Pancreas: Unremarkable. No pancreatic ductal dilatation or surrounding inflammatory changes. Spleen: Normal in size without significant abnormality. Adrenals/Urinary Tract: Unchanged nodule  of the lateral limb of the left adrenal gland, not previously FDG avid (series 2, image 72). Kidneys are normal, without renal calculi, solid lesion, or hydronephrosis. Bladder is unremarkable. Stomach/Bowel: Stomach is within normal limits. Appendix appears normal. Unchanged partially circumferential soft tissue thickening of the cecum at the level of the terminal ileum (series 4, image 78). Vascular/Lymphatic: Aortic atherosclerosis. No enlarged abdominal or pelvic lymph nodes. Reproductive: No mass or other abnormality. Other: No abdominal wall hernia or abnormality. No abdominopelvic ascites. Unchanged low-attenuation metastatic soft tissue nodule adjacent to the medial left kidney measuring 2.0 x 1.3 cm (series 2, image 77). Musculoskeletal: Extensive lytic osseous metastatic disease throughout the included skeleton is unchanged, including of the  right scapular body (series 2, image 11), left acromion (series 2, image 9), multiple bilateral ribs (series 2, image 19), the L5 vertebral body (series 2, image 100), and multiple bilateral pelvic lesions (series 2, image 110). IMPRESSION: 1. Large left pleural effusion, substantially increased compared to prior examination with extensive atelectasis and/or consolidation of the left lower lobe and lingula. There is extensive fluid within the left mainstem bronchus and distal airways. 2. Increased conspicuity of left-sided pleural nodularity,, consistent with worsened pleural metastatic disease. 3. Unchanged post treatment appearance of soft tissue about the left hilum, subcarinal station, and right hilum, as well as unchanged enlarged pretracheal lymphadenopathy, these findings all reflecting previously FDG avid metastatic disease. 4. Stable nodularity of the apical left upper lobe, again reflecting previously FDG avid metastatic disease. 5. Significant interval increase in perihilar consolidation of the left lung as well as the paramedian left upper lobe. This is  highly suspicious for locally worsened malignancy, although difficult to distinguish from atelectasis related to increased size of pleural effusion. There is substantial FDG avid disease in this vicinity on prior PET-CT. Repeat PET-CT may be helpful to assess for interval change in avid disease. 6. Unchanged metastatic soft tissue nodule adjacent to the medial left kidney. 7. Unchanged widespread lytic osseous metastatic disease. 8. Unchanged partially circumferential soft tissue thickening of the cecum at the level of the terminal ileum. This remains concerning for metachronous primary colon malignancy given appearance, metastatic disease to the bowel less favored. 9. Emphysema. Aortic Atherosclerosis (ICD10-I70.0) and Emphysema (ICD10-J43.9). Electronically Signed   By: Eddie Candle M.D.   On: 01/16/2021 10:42   MR ANGIO HEAD WO W CONTRAST  Result Date: 01/04/2021 CLINICAL DATA:  Non-small cell lung cancer. Squamous cell cancer of the epiglottis. Cerebellar metastases versus strokes on prior MRI. EXAM: MRI HEAD WITHOUT AND WITH CONTRAST MRA HEAD WITHOUT AND WITH CONTRAST MRA NECK WITHOUT AND WITH CONTRAST TECHNIQUE: Multiplanar, multiecho pulse sequences of the brain and surrounding structures were obtained without and with intravenous contrast. Angiographic images of the Circle of Willis were obtained using MRA technique without and with intravenous contrast. Angiographic images of the neck were obtained using MRA technique without and with intravenous contrast. Carotid stenosis measurements (when applicable) are obtained utilizing NASCET criteria, using the distal internal carotid diameter as the denominator. CONTRAST:  29mL GADAVIST GADOBUTROL 1 MMOL/ML IV SOLN COMPARISON:  MRI 11/06/2020. FINDINGS: MRI HEAD FINDINGS Brain/skull: Previously seen cerebellar restricted diffusion has resolved with nonenhancing encephalomalacia in these regions now, compatible with prior infarcts. No acute infarct. The left  frontal calvarial lesion has increased central necrotic component with decreased surrounding enhancement, measuring approximately 3.5 x 1.8 cm. The left temporal fossa/temporalis muscle lesion and dural involvement is less conspicuous than on the prior and difficult to discretely measure. Enhancing midline parietal calvarial lesion appears slightly decreased in bulk, measuring approximately 3.7 x 1.4 cm (series 1600, image 27), previously 3.9 x 1.5 cm when remeasured. Enhancing left parietal lesion of the lateral left parietal calvarium may be slightly decreased in thickness, measuring approximately 5 mm today (series 1600, image 195) and 8 mm on prior when remeasured. No evidence of abnormal intraparenchymal enhancement or intraparenchymal mass lesion. No hydrocephalus. No acute hemorrhage. No extra-axial fluid collection. No midline shift. Basal cisterns are patent. Partially empty sella. Skull and upper cervical spine: Skull lesions as detailed above. Additionally, there is a partially imaged T1 hypointense lesion involving the superior C4 vertebral body. Sinuses/Orbits: Mild paranasal sinus mucosal thickening without air-fluid levels. Unremarkable  orbits. Other: No mastoid effusions. MRA HEAD FINDINGS Anterior circulation: No large vessel occlusion, proximal hemodynamically significant stenosis, or evidence of aneurysm. Posterior circulation:No large vessel occlusion, proximal hemodynamically significant stenosis, or evidence of aneurysm. Bilateral posterior communicating arteries are patent. MRA NECK FINDINGS Common carotid origins are not imaged. There is narrowing of the left carotid bifurcation with approximately 60% ICA origin stenosis (series 11101, image 80). On the right, there is mild carotid bifurcation atherosclerosis without greater than 50% narrowing. Left dominant vertebral artery. Bilateral vertebral arteries are patent without evidence of hemodynamically significant stenosis. Bilateral PICAs are  patent. IMPRESSION: MRI head: 1. Previously seen cerebellar restricted diffusion has resolved with nonenhancing encephalomalacia in these regions now, compatible with prior infarcts. 2. Decreased conspicuity/bulk of enhancement of the calvarial lesions with increased central necrosis of the left frontal lesion and increased diffusion restriction of the lesions. Overall, findings may reflect treatment response but warrants continued attention on follow-up. 3. T1 hypointense lesion involving the superior C4 vertebral body, incompletely characterized on this study but concerning for a metastasis. MRA: 1. Approximately 60% stenosis of the left ICA origin. 2. Otherwise, no emergent large vessel occlusion or proximal hemodynamically significant stenosis. Electronically Signed   By: Margaretha Sheffield MD   On: 01/04/2021 12:09    ASSESSMENT AND PLAN: This is a very pleasant 56 years old white male with recently diagnosed stage IIIB/IV non-small cell lung cancer, squamous cell carcinoma presented with large left hilar mass in addition to mediastinal and left supraclavicular lymphadenopathy as well as suspicious right upper lobe pulmonary nodule diagnosed in August 2018. 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 He underwent a course of concurrent chemoradiation to the lung as well as the epiglottic area under the care of Dr. Tammi Klippel. He is status post 6 cycle. He tolerated this course of treatment well except for the radiation induced esophagitis as well as weight loss and fatigue. The patient had partial response to the previous treatment. He completed treatment with Keytruda 200 mg IV every 3 weeks status post 35 cycles. 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 Recent imaging studies including CT of the chest as well as PET scan showed  widespread metastasis involving the right retromolleolar, mediastinal and bilateral hilar lymph nodes, bilateral pulmonary nodules as well as left pleural space and retroperitoneal nodule medial to the left kidney, the gallbladder and multiple osseous metastasis.  The MRI of the brain showed central necrotic enhancing left frontal calvarial lesion measuring 4.3 x 3.2 cm with underlying dural thickening.  There was also abnormal enhancing tissue measuring 2.8 x 2 point centimeters involving the left temporal foss/temporalis muscle with underlying dural thickening and enhancement as part of multifocal calvarial lesions with underlying dural metastasis involving the left frontal, midline parietal and left temporal regions.  There was also multifocal left cerebellar restricted diffusion concerning for acute/subacute infarcts versus evolving metastatic lesions. The patient started systemic chemotherapy with carboplatin for AUC of 5, paclitaxel 175 mg/M2 and Keytruda 200 mg IV every 3 weeks with Neulasta support status post 3 cycles.  The patient tolerated the treatment well except for fatigue. He had repeat CT scan of the neck, chest, abdomen pelvis performed recently.  I personally and independently reviewed the scans and discussed the results with the patient and his wife. Unfortunately his scan showed evidence for disease progression in  the neck as well as the chest. I had a lengthy discussion with the patient and his wife today about his current condition and treatment options. I did the patient the option of palliative care and hospice referral versus consideration of second line treatment with single agent gemcitabine.  Unfortunately his condition has been deteriorating rapidly and the patient is very symptomatic.  He would like to proceed with palliative care and hospice at this point. For the recurrent left pleural effusion, I will arrange for the patient to have therapeutic ultrasound-guided left  thoracentesis. I will see the patient on as-needed basis at this point. For pain management he will continue his current treatment with fentanyl patch and oxycodone. For the dehydration, I will arrange for the patient to receive 1 L of normal saline today. He was advised to call if he has any other concerning issues in the interval. The patient voices understanding of current disease status and treatment options and is in agreement with the current care plan.  All questions were answered. The patient knows to call the clinic with any problems, questions or concerns. We can certainly see the patient much sooner if necessary.  Disclaimer: This note was dictated with voice recognition software. Similar sounding words can inadvertently be transcribed and may not be corrected upon review.

## 2021-01-17 NOTE — Patient Instructions (Signed)

## 2021-01-17 NOTE — Patient Instructions (Signed)
Dehydration, Adult Dehydration is condition in which there is not enough water or other fluids in the body. This happens when a person loses more fluids than he or she takes in. Important body parts cannot work right without the right amount of fluids. Any loss of fluids from the body can cause dehydration. Dehydration can be mild, worse, or very bad. It should be treated right away to keep it from getting very bad. What are the causes? This condition may be caused by:  Conditions that cause loss of water or other fluids, such as: ? Watery poop (diarrhea). ? Vomiting. ? Sweating a lot. ? Peeing (urinating) a lot.  Not drinking enough fluids, especially when you: ? Are ill. ? Are doing things that take a lot of energy to do.  Other illnesses and conditions, such as fever or infection.  Certain medicines, such as medicines that take extra fluid out of the body (diuretics).  Lack of safe drinking water.  Not being able to get enough water and food. What increases the risk? The following factors may make you more likely to develop this condition:  Having a long-term (chronic) illness that has not been treated the right way, such as: ? Diabetes. ? Heart disease. ? Kidney disease.  Being 65 years of age or older.  Having a disability.  Living in a place that is high above the ground or sea (high in altitude). The thinner, dried air causes more fluid loss.  Doing exercises that put stress on your body for a long time. What are the signs or symptoms? Symptoms of dehydration depend on how bad it is. Mild or worse dehydration  Thirst.  Dry lips or dry mouth.  Feeling dizzy or light-headed, especially when you stand up from sitting.  Muscle cramps.  Your body making: ? Dark pee (urine). Pee may be the color of tea. ? Less pee than normal. ? Less tears than normal.  Headache. Very bad dehydration  Changes in skin. Skin may: ? Be cold to the touch (clammy). ? Be blotchy  or pale. ? Not go back to normal right after you lightly pinch it and let it go.  Little or no tears, pee, or sweat.  Changes in vital signs, such as: ? Fast breathing. ? Low blood pressure. ? Weak pulse. ? Pulse that is more than 100 beats a minute when you are sitting still.  Other changes, such as: ? Feeling very thirsty. ? Eyes that look hollow (sunken). ? Cold hands and feet. ? Being mixed up (confused). ? Being very tired (lethargic) or having trouble waking from sleep. ? Short-term weight loss. ? Loss of consciousness. How is this treated? Treatment for this condition depends on how bad it is. Treatment should start right away. Do not wait until your condition gets very bad. Very bad dehydration is an emergency. You will need to go to a hospital.  Mild or worse dehydration can be treated at home. You may be asked to: ? Drink more fluids. ? Drink an oral rehydration solution (ORS). This drink helps get the right amounts of fluids and salts and minerals in the blood (electrolytes).  Very bad dehydration can be treated: ? With fluids through an IV tube. ? By getting normal levels of salts and minerals in your blood. This is often done by giving salts and minerals through a tube. The tube is passed through your nose and into your stomach. ? By treating the root cause. Follow these instructions at   home: Oral rehydration solution If told by your doctor, drink an ORS:  Make an ORS. Use instructions on the package.  Start by drinking small amounts, about  cup (120 mL) every 5-10 minutes.  Slowly drink more until you have had the amount that your doctor said to have. Eating and drinking  Drink enough clear fluid to keep your pee pale yellow. If you were told to drink an ORS, finish the ORS first. Then, start slowly drinking other clear fluids. Drink fluids such as: ? Water. Do not drink only water. Doing that can make the salt (sodium) level in your body get too low. ? Water  from ice chips you suck on. ? Fruit juice that you have added water to (diluted). ? Low-calorie sports drinks.  Eat foods that have the right amounts of salts and minerals, such as: ? Bananas. ? Oranges. ? Potatoes. ? Tomatoes. ? Spinach.  Do not drink alcohol.  Avoid: ? Drinks that have a lot of sugar. These include:  High-calorie sports drinks.  Fruit juice that you did not add water to.  Soda.  Caffeine. ? Foods that are greasy or have a lot of fat or sugar.         General instructions  Take over-the-counter and prescription medicines only as told by your doctor.  Do not take salt tablets. Doing that can make the salt level in your body get too high.  Return to your normal activities as told by your doctor. Ask your doctor what activities are safe for you.  Keep all follow-up visits as told by your doctor. This is important. Contact a doctor if:  You have pain in your belly (abdomen) and the pain: ? Gets worse. ? Stays in one place.  You have a rash.  You have a stiff neck.  You get angry or annoyed (irritable) more easily than normal.  You are more tired or have a harder time waking than normal.  You feel: ? Weak or dizzy. ? Very thirsty. Get help right away if you have:  Any symptoms of very bad dehydration.  Symptoms of vomiting, such as: ? You cannot eat or drink without vomiting. ? Your vomiting gets worse or does not go away. ? Your vomit has blood or green stuff in it.  Symptoms that get worse with treatment.  A fever.  A very bad headache.  Problems with peeing or pooping (having a bowel movement), such as: ? Watery poop that gets worse or does not go away. ? Blood in your poop (stool). This may cause poop to look black and tarry. ? Not peeing in 6-8 hours. ? Peeing only a small amount of very dark pee in 6-8 hours.  Trouble breathing. These symptoms may be an emergency. Do not wait to see if the symptoms will go away. Get  medical help right away. Call your local emergency services (911 in the [place] Do not drive yourself to the hospital. Summary  Dehydration is a condition in which there is not enough water or other fluids in the body. This happens when a person loses more fluids than he or she takes in.  Treatment for this condition depends on how bad it is. Treatment should be started right away. Do not wait until your condition gets very bad.  Drink enough clear fluid to keep your pee pale yellow. If you were told to drink an oral rehydration solution (ORS), finish the ORS first. Then, start slowly drinking other clear fluids.    Take over-the-counter and prescription medicines only as told by your doctor.  Get help right away if you have any symptoms of very bad dehydration. This information is not intended to replace advice given to you by your health care provider. Make sure you discuss any questions you have with your health care provider. Document Revised: 06/09/2019 Document Reviewed: 06/09/2019 Elsevier Patient Education  2021 [employer]  

## 2021-01-17 NOTE — Progress Notes (Signed)
I spoke with patient and his wife today.  According to Dr. Julien Nordmann, patient is not responding to treatment.  Dr. Julien Nordmann discussed options and he's plan of care is hospice care.  Jerri was thankful to Dr. Julien Nordmann for all the care through his cancer journey and verbalized understanding of next steps.

## 2021-01-18 ENCOUNTER — Telehealth: Payer: Self-pay | Admitting: Medical Oncology

## 2021-01-18 ENCOUNTER — Other Ambulatory Visit (HOSPITAL_COMMUNITY): Payer: 59

## 2021-01-18 ENCOUNTER — Other Ambulatory Visit (HOSPITAL_COMMUNITY)
Admission: RE | Admit: 2021-01-18 | Discharge: 2021-01-18 | Disposition: A | Discharge status: Home / Self Care | Payer: 59 | Source: Ambulatory Visit | Attending: Internal Medicine | Admitting: Internal Medicine

## 2021-01-18 DIAGNOSIS — Z01812 Encounter for preprocedural laboratory examination: Secondary | ICD-10-CM | POA: Diagnosis not present

## 2021-01-18 DIAGNOSIS — Z20822 Contact with and (suspected) exposure to covid-19: Secondary | ICD-10-CM | POA: Diagnosis not present

## 2021-01-18 LAB — SARS CORONAVIRUS 2 (TAT 6-24 HRS): SARS Coronavirus 2: NEGATIVE

## 2021-01-18 NOTE — Telephone Encounter (Signed)
Per Cassie I instructed wife to get OTC robitussin or Delsym for [employer]' cough.

## 2021-01-18 NOTE — Telephone Encounter (Signed)
Request something for his cough. He tried [employer] ( his wife"s)  and they do not help. He uses [employer] Thoracentesis scheduled for Monday.

## 2021-01-19 ENCOUNTER — Inpatient Hospital Stay: Payer: 59

## 2021-01-21 ENCOUNTER — Inpatient Hospital Stay (HOSPITAL_COMMUNITY): Admission: RE | Admit: 2021-01-21 | Payer: 59 | Source: Ambulatory Visit

## 2021-01-22 ENCOUNTER — Telehealth: Payer: Self-pay | Admitting: Medical Oncology

## 2021-01-22 NOTE — Telephone Encounter (Signed)
I contacted wife and expressed our condolences.   She said she just got home from work on Friday and he got up with his walker and went to bathroom and on his way back he just collapsed . She did CPR , but he could not be revived.  His son was with him too.  She appreciated everything that Dr Julien Nordmann and all the staff at the cancer center did to care for [facility].

## 2021-01-24 ENCOUNTER — Other Ambulatory Visit: Payer: 59

## 2021-01-24 ENCOUNTER — Inpatient Hospital Stay: Payer: 59

## 2021-01-31 ENCOUNTER — Other Ambulatory Visit: Payer: 59

## 2021-02-07 ENCOUNTER — Ambulatory Visit: Payer: 59 | Admitting: Internal Medicine

## 2021-02-07 ENCOUNTER — Ambulatory Visit: Payer: 59 | Admitting: Medical

## 2021-02-07 ENCOUNTER — Other Ambulatory Visit: Payer: 59

## 2021-02-07 ENCOUNTER — Ambulatory Visit: Payer: 59

## 2021-02-08 DEATH — deceased

## 2021-02-09 ENCOUNTER — Ambulatory Visit: Payer: 59

## 2021-02-13 ENCOUNTER — Telehealth: Payer: Self-pay | Admitting: *Deleted

## 2021-02-13 NOTE — Telephone Encounter (Signed)
My last progress note, under visit diagnosis it says "Stroke".  There is also MRI record of multiple strokes in the posterior fossa.  I'm not clear on what we're missing here, let me know what I need to addend if anything  Ventura Sellers, MD

## 2021-02-13 NOTE — Telephone Encounter (Signed)
Patients wife called today stating that the [employer] she held for [employer] is disputing the claims she has made on the policy if the patient were to have had a stroke.    We have previously submitted medical records of documentation of concern for acute/subacute infarct but they are stating that it does not clearly state he in fact had a stroke.  She was hoping for any additional documentation or addendums to be supported to turn in on this claim.  She will fax the most recent letter she has gotten from [employer] to our office to see if we can assist.

## 2021-02-14 ENCOUNTER — Other Ambulatory Visit: Payer: 59

## 2021-02-14 ENCOUNTER — Ambulatory Visit: Payer: 59

## 2021-02-14 ENCOUNTER — Ambulatory Visit: Payer: 59 | Admitting: Internal Medicine

## 2021-03-04 NOTE — Progress Notes (Signed)
To Whom It Concerns,  Trevor Zavala experienced acute symptomatic stroke on 11/06/20.  His presenting stroke symptom was gait instability, correlating with lesions in the posterior fossa.  With cancer patients, scans need to be repeated at times because radiographic signatures of stroke and metastatic disease can overlap.    Repeat scan 01/04/21 confirmed stroke as the etiology rather than tumor/metastases.    Thank you, Ventura Sellers, MD

## 2021-03-08 ENCOUNTER — Other Ambulatory Visit: Payer: 59
